# Patient Record
Sex: Female | Born: 1970 | Race: White | Hispanic: No | Marital: Married | Smoking: Current every day smoker
Health system: Southern US, Community
[De-identification: ages and names within clinical notes are randomized; demographics above are authoritative.]

## PROBLEM LIST (undated history)

## (undated) DIAGNOSIS — R0602 Shortness of breath: Secondary | ICD-10-CM

## (undated) DIAGNOSIS — F209 Schizophrenia, unspecified: Secondary | ICD-10-CM

## (undated) DIAGNOSIS — F4310 Post-traumatic stress disorder, unspecified: Secondary | ICD-10-CM

## (undated) DIAGNOSIS — F319 Bipolar disorder, unspecified: Secondary | ICD-10-CM

## (undated) DIAGNOSIS — D649 Anemia, unspecified: Secondary | ICD-10-CM

## (undated) DIAGNOSIS — F32A Depression, unspecified: Secondary | ICD-10-CM

## (undated) DIAGNOSIS — J45909 Unspecified asthma, uncomplicated: Secondary | ICD-10-CM

## (undated) DIAGNOSIS — R569 Unspecified convulsions: Secondary | ICD-10-CM

## (undated) DIAGNOSIS — E039 Hypothyroidism, unspecified: Secondary | ICD-10-CM

## (undated) DIAGNOSIS — R011 Cardiac murmur, unspecified: Secondary | ICD-10-CM

## (undated) DIAGNOSIS — F419 Anxiety disorder, unspecified: Secondary | ICD-10-CM

## (undated) DIAGNOSIS — F329 Major depressive disorder, single episode, unspecified: Secondary | ICD-10-CM

## (undated) DIAGNOSIS — R51 Headache: Secondary | ICD-10-CM

## (undated) HISTORY — PX: NASAL FRACTURE SURGERY: SHX718

## (undated) HISTORY — PX: FOOT SURGERY: SHX648

## (undated) HISTORY — PX: LEG SURGERY: SHX1003

---

## 1994-05-06 HISTORY — PX: TUBAL LIGATION: SHX77

## 2007-12-26 ENCOUNTER — Emergency Department (HOSPITAL_BASED_OUTPATIENT_CLINIC_OR_DEPARTMENT_OTHER): Admission: EM | Admit: 2007-12-26 | Discharge: 2007-12-26 | Payer: Self-pay | Admitting: Emergency Medicine

## 2008-01-07 ENCOUNTER — Encounter: Payer: Self-pay | Admitting: Emergency Medicine

## 2008-01-07 ENCOUNTER — Inpatient Hospital Stay (HOSPITAL_COMMUNITY): Admission: EM | Admit: 2008-01-07 | Discharge: 2008-01-11 | Payer: Self-pay | Admitting: Internal Medicine

## 2008-01-15 ENCOUNTER — Emergency Department (HOSPITAL_BASED_OUTPATIENT_CLINIC_OR_DEPARTMENT_OTHER): Admission: EM | Admit: 2008-01-15 | Discharge: 2008-01-15 | Payer: Self-pay | Admitting: Emergency Medicine

## 2008-05-18 ENCOUNTER — Ambulatory Visit (HOSPITAL_COMMUNITY): Payer: Self-pay | Admitting: Psychology

## 2008-08-10 ENCOUNTER — Ambulatory Visit (HOSPITAL_COMMUNITY): Payer: Self-pay | Admitting: Psychiatry

## 2008-09-15 ENCOUNTER — Ambulatory Visit (HOSPITAL_COMMUNITY): Payer: Self-pay | Admitting: Psychiatry

## 2008-11-24 ENCOUNTER — Ambulatory Visit (HOSPITAL_COMMUNITY): Payer: Self-pay | Admitting: Psychiatry

## 2009-05-15 ENCOUNTER — Ambulatory Visit (HOSPITAL_COMMUNITY): Payer: Self-pay | Admitting: Psychiatry

## 2009-06-16 ENCOUNTER — Ambulatory Visit (HOSPITAL_COMMUNITY): Payer: Self-pay | Admitting: Psychiatry

## 2009-07-13 ENCOUNTER — Ambulatory Visit (HOSPITAL_COMMUNITY): Payer: Self-pay | Admitting: Psychiatry

## 2009-07-17 ENCOUNTER — Emergency Department (HOSPITAL_BASED_OUTPATIENT_CLINIC_OR_DEPARTMENT_OTHER): Admission: EM | Admit: 2009-07-17 | Discharge: 2009-07-17 | Payer: Self-pay | Admitting: Emergency Medicine

## 2010-09-18 NOTE — Procedures (Signed)
EEG NUMBER:  [id number]   This is a portable EEG study performed in an awake and asleep patient  with right handedness.  Hyperventilation and photic stimulation were not  part of the 16-channel EEG with one channel representing heart rate and  rhythm exclusively.  Medications are Protonix, Klonopin, Effexor,  Celexa, Lamictal, Duragesic, Percocet, morphine sulfate, and Seroquel.  The patient is a 40 year old female with bipolar disorder, posttraumatic  stress disorder admitted at this time due to a medical complaint,  compression fractures of the eighth thoracic vertebrae following a fall  while roller skating.  The request form for the EEG does not state why  an EEG is ordered only that it is to rule out possible seizure activity.  There is no clinical equivalent of symptoms noted.   DESCRIPTION:  A posterior dominant background rhythm is seen at 12 Hz.  This is a very fast posterior dominant rhythm, but it is asymmetrically  expressed in this EEG as the right occipital dominant rhythm is 7 Hz at  times and there by several points in frequency slower than the left  counterpart.  I see the slowing mostly over the lateral and not central  temporal occipital vector.  As the study goes on the patient's EEG  remains in normal sinus rhythm with similar appearing QRS complexes  throughout.  Theta range frequencies dominate the drowsiness stages.  The patient fell asleep and was by the technician noted to be snoring.  There is significant beta fast activity in excess over the frontal and  frontal polar region, this is likely a medication induced artifact.  Sleep spindles are noted and are symmetric in nature.  The described  slowing over the O2 region did resolve as the patient entered sleep and  may have been related to an electrode artifact.  Review in several  montages does not confirm a continued slowing over the O2 region.   CONCLUSION:  This is an EEG altered due to medication side effects,  but  it shows no epileptiform activity.  The patient appears drowsy and  asleep for most of this time and has beta fast activity in excess.  If  not already obtained, an imaging study is recommended to see if there is  a correlation to the described O2 slowing that appeared periodically,  but not continuous through this EEG.      Melvyn Novas, M.D.  Electronically Signed     [id number]  D:  01/08/2008 19:37:46  T:  01/09/2008 06:40:39  Job #:  [id number]   cc:   Eduard Clos, MD

## 2010-09-18 NOTE — H&P (Signed)
NAME:  Danielle [name], Danielle [name]                ACCOUNT NO.:  [id number]   MEDICAL RECORD NO.:  [id number]          PATIENT TYPE:  INP   LOCATION:  1406                         FACILITY[facility]  [facility]   PHYSICIAN:  Eduard Clos, MDDATE OF BIRTH:  18-Oct-1970   DATE OF ADMISSION:  01/07/2008  DATE OF DISCHARGE:                              HISTORY & PHYSICAL   PRIORITY ADMISSION HISTORY AND PHYSICAL   CHIEF COMPLAINT:  Chest pain.   HISTORY OF PRESENTING ILLNESS:  A 40 year old female with a history of  bipolar disorder, posttraumatic stress disorder, underlying  panicdisorder, and recently diagnosed with mild T8 superior endplate  compression fracture, which she sustained on August 22nd after she had a  fall during skating, presented to the ER complaining of persistent chest  pain.  The patient stated that she fell while she practiced skate.  At  that time, she did come to the ER, had a CT done, and she did follow  with the neurosurgeon, who had advised her to be on Aleve for pain  relief.  Her back pain has been gradually resolved now, but she states  since she fell she has been having the chest pain that is retrosternal,  constant, stabbing in nature, nonradiating, not relieved by rest.  Denies any associated shortness of breath, cough, fever, chills,  weakness of limbs, abdominal pain, nausea, vomiting, diarrhea.  In the  ER, patient was found to have this persistent pain.  Cardiac enzymes  were negative.  Due to persistent pain, the patient has been admitted  for rule out ACS and of her pain.   PAST MEDICAL HISTORY:  1. Bipolar disorder.  2. Underlying panic disorder.  3. PTSD.   PAST SURGICAL HISTORY:  Tubal ligation.   MEDICATIONS PRIOR TO ADMISSION:  The patient is on:  1. Clonazepam, takes 1.5 mg in the a.m. and at noon, and takes 1 mg at      bedtime.  2. Effexor XR, takes two 25 mg p.o. daily..   ALLERGIES:  No known drug allergies.   SOCIAL HISTORY:  The patient lives with  her husband, smokes cigarettes  but has been advised to quit smoking, denies any alcohol or drug abuse.   FAMILY HISTORY:  Noncontributory.   REVIEW OF SYSTEMS:  As per in the History of Presenting Illness, nothing  else significant.   PHYSICAL EXAMINATION:  GENERAL:  The patient was examined at the bedside  not in acute distress.  VITAL SIGNS:  Blood pressure is 116/80, pulse 84 per minute, temperature  98.7, respirations 16 per minute.  HEENT:  Anicteric conjunctivae.  CHEST:  Clear to auscultation and percussion, no rhonchi, and no  crepitation, no obvious injuries seen.  HEART:  S1 and S2 heard.  ABDOMEN:  Soft and nontender, bowel sounds heard, no guarding, and no  rigidity.  CNS:  The patient is awake, alert, and oriented to time, place, and  person, moves upper and lower extremities 5/5.  EXTREMITIES:  Peripheral pulses felt.   LABORATORY DATA:  CBC:  WBC is 6.3, hemoglobin 13.2, hematocrit 38.9,  platelets 311, neutrophils 51%.  Basic metabolic panel:  Sodium 141,  potassium 3.8, chloride 106, carbon dioxide 24, glucose 33, BUN 10,  creatinine 0.7, calcium 9.6.  CK-MB 11.1, troponin-I of less than 0.05.  Urine pregnancy test negative.  UA is negative for nitrites, leukocytes,  8% blood.  Chest x-ray, nothing acute.  CT of T-spine done without  contrast on December 27, 2007, shows a mild T8 superior endplate  compression fracture, no canal compromise or extension into the  posterior elements, a soft tissue density in the left paracentral canal  at T7 and 8.  This could have pressed inferiorly upon a disk bulge.  However, an acute disk herniation or even a small focal sub/epidural  hematoma would look similar.   ASSESSMENT:  1. Chest pain, atypical, rule out acute coronary syndrome (ACS).  2. Superior endplate compression fracture status post fall.  3. Bipolar disorder.  4. History of posttraumatic stress disorder.  5. History of underlying panicdisorder.  6. Cigarette  smoking.   PLAN:  Admit patient to telemetry and cycle cardiac markers, place the  patient on pain relief medication.  We will get a CT angio of chest and  MRI of T-spine.  The patient is not placed on aspirin due to CT  suggesting a possible sub/epidural hematoma.  She will also not be  placed on low dose of  heparin but will be placed on SCDs for DVT  prophylaxis and will be placed on GI prophylaxis.  Further  recommendations as the patient's condition evolves.      Eduard Clos, MD  Electronically Signed     ANK/MEDQ  D:  01/07/2008  T:  01/07/2008  Job:  [id number]

## 2010-09-18 NOTE — Discharge Summary (Signed)
NAME:  Danielle [name], Danielle [name]                ACCOUNT NO.:  [id number]   MEDICAL RECORD NO.:  [id number]          PATIENT TYPE:  INP   LOCATION:  1406                         [facility]ACILITY:  [facility]   PHYSICIAN:  Isidor Holts, M.D.  DATE OF BIRTH:  [birth year]   DATE OF ADMISSION:  01/07/2008  DATE OF DISCHARGE:  01/11/2008                               DISCHARGE SUMMARY   PRIMARY MEDICAL DOCTOR:  Unassigned.   PRIMARY NEUROSURGEON:  Dr. Tressie Stalker.   DISCHARGE DIAGNOSES:  1. T4 and T8 vertebral fractures.  2. Radiculopathy/musculoskeletal chest pain, secondary to #1.  3. History of bipolar disorder, PTSD and panic disorder.  4. Smoking history.  5. History of seizure disorder.  6. Hypertriglyceridemia.   DISCHARGE MEDICATIONS:  1. Lamotrigine 50 mg p.o. q. a.m. and 100 mg p.o. nightly.  2. Clonazepam 1.5 mg p.o. b.i.d. and 1 mg p.o. nightly.  3. Effexor XR 225 mg p.o. q. a.m.  4. Celexa at 20 mg p.o. daily.  5. Seroquel 50 mg p.o. b.i.d. and 300 mg p.o. nightly.  6. Fenofibrate 48 mg p.o. daily.  7. Motrin 600 mg p.o. t.i.d. with food for 1 week only.  8. Vicodin 5/500 one p.o. p.r.n. q.6 hours, total of 20 pills have      been dispensed.   PROCEDURES:  1. Chest x-ray dated 01/07/2008.  This showed no acute cardiopulmonary      disease.  There was mild compression fracture of the upper endplate      of T8 again noted.  2. Chest CT angiogram dated 01/08/2008.  This showed no evidence of      aortic dissection.  Study which terminates at the upper margin of      the liver.  There was no acute pulmonary parenchymal abnormality.      There is a calcified granuloma in the right middle lobe and mild      date bibasilar scarring.  3. Head CT scan dated 01/08/2008.  This was a normal head CT.  4. MRI thoracic spine dated 01/08/2008.  This showed sequelae of T8      and T4 superior end plate fractures no retropulsion of bone spinal      or neural foraminal stenosis, multilevel thoracic and  lower      cervical disk degeneration with small disk protrusions.   CONSULTATIONS:  Telephone discussion with Dr. Channing [name], neurosurgeon on  January 09, 2008.   ADMISSION HISTORY:  The H&P notes of 01/07/2008, dictated by Dr. Midge [name].  However, in brief this is a 40 year old female, with known  history of bipolar disorder, panic disorder, PTSD, status post previous  tubal ligation, presenting with complaints of chest pain.  Reportedly,  she is status post fall while skating on December 26, 2007.  At the time,  she was diagnosed with a mild T8 superior end plate compression  fracture.  Was evaluated by neurosurgeon Dr. Tressie Stalker at that  time, and was followed up in Dr. Lovell [name]' office.  She was commenced on  NSAIDS.  However, although back pain has somewhat improved, she has  developed  chest pain which is essentially positional in character and  exacerbated by movement.  She was admitted for further evaluation,  investigation and management.   CLINICAL COURSE:  1. A T4/T8 vertebral fracture.  For details of presentation, refer to      admission history above.  The patient was worked up with MRI of      thoracic spine and this confirmed the superior end plate fractures      of T4 and T8 with no neural foraminal or neurologic compromise.      The patient was managed with NSAIDS and p.r.n. opioid analgesics,      with clinical improvement. I had a telephone discussion with Dr.      Channing [name], covering for Dr. Lovell [name] on 01/09/2008.  He recommended      continued NSAID therapy and followup with primary neurosurgeon,      following discharge. No additional recommendations were made.   1. Chest pain.  This is likely radiculopathic/musculoskeletal,      secondary to #1 above.  The patient was worked up with chest x-ray,      which was unremarkable for concerning findings, as well as chest CT      angiogram which showed no evidence of pulmonary embolism or aortic      dissection.   Cardiac enzymes were cycled and were not elevated.  On      detailed questioning, it appears that the pain is exacerbated by      position and also by movement.  The patient was managed as outlined      above, with clinical improvement.   1. Smoking history.  The patient was counseled appropriately for this.   1. Dyslipidemia.  As part of initial workup for chest pain, the      patient underwent fasting lipid profile which showed the following      findings.  Total cholesterol 234, triglycerides 410.  HDL 20, LDL      not calculated.  The patient has been commenced on Fenofibrate for      this.   1. Seizure disorder.  There no problems referable to this, during the      course of the patient's hospitalization.   1. Psychiatric problems.  The patient has multiple psychiatric      problems, including bipolar disorder, PTSD and panic disorder.  She      was continued on pre-admission psychotropics, during the course of      this hospitalization, and there were no problems referable to      these.   DISPOSITION:  The patient was on January 11, 2008 considered clinically  stable for discharge.  She was therefore discharged accordingly.   DIET:  No restrictions.   ACTIVITY:  Recommended to increase activity slowly.   FOLLOWUP INSTRUCTIONS:  The patient is recommended to follow up with her  primary neurosurgeon, Dr. Tressie Stalker, on a date to be determined.  She has been instructed to call for an appointment on 09/08, 2009 a.m.  Telephone number [phone].  This has been communicated to the  patient.  She verbalized understanding.      Isidor Holts, M.D.  Electronically Signed     CO/MEDQ  D:  01/11/2008  T:  01/11/2008  Job:  [id number]

## 2011-02-06 LAB — URINALYSIS, ROUTINE W REFLEX MICROSCOPIC
Glucose, UA: NEGATIVE
Hgb urine dipstick: NEGATIVE
Ketones, ur: NEGATIVE
Protein, ur: NEGATIVE
pH: 6

## 2011-02-06 LAB — CARDIAC PANEL(CRET KIN+CKTOT+MB+TROPI)
CK, MB: 0.5
Relative Index: INVALID
Relative Index: INVALID
Relative Index: INVALID
Troponin I: 0.02
Troponin I: 0.03

## 2011-02-06 LAB — BASIC METABOLIC PANEL
BUN: 8
CO2: 22
CO2: 24
Calcium: 9.1
Chloride: 106
Chloride: 109
Creatinine, Ser: 0.64
Creatinine, Ser: 0.7
GFR calc Af Amer: >60
GFR calc Af Amer: >60
Potassium: 3.8
Sodium: 141

## 2011-02-06 LAB — CBC
HCT: 38.9
Hemoglobin: 13.2
MCHC: 33.9
MCV: 86.8
RBC: 4.48

## 2011-02-06 LAB — POCT CARDIAC MARKERS: Troponin i, poc: <0.05

## 2011-02-06 LAB — LIPID PANEL
Cholesterol: 234 — ABNORMAL HIGH
HDL: 20 — ABNORMAL LOW
Total CHOL/HDL Ratio: 11.7
Triglycerides: 410 — ABNORMAL HIGH

## 2011-02-06 LAB — DIFFERENTIAL
Basophils Relative: 1
Eosinophils Absolute: 0.2
Eosinophils Relative: 3
Lymphs Abs: 2.3
Monocytes Absolute: 0.6
Monocytes Relative: 9

## 2011-02-06 LAB — TSH: TSH: 3.264

## 2011-03-08 ENCOUNTER — Emergency Department (HOSPITAL_COMMUNITY)
Admission: EM | Admit: 2011-03-08 | Discharge: 2011-03-09 | Disposition: A | Discharge status: Home / Self Care | Payer: BC Managed Care – PPO | Attending: Emergency Medicine | Admitting: Emergency Medicine

## 2011-03-08 DIAGNOSIS — Z79899 Other long term (current) drug therapy: Secondary | ICD-10-CM | POA: Insufficient documentation

## 2011-03-08 DIAGNOSIS — E785 Hyperlipidemia, unspecified: Secondary | ICD-10-CM | POA: Insufficient documentation

## 2011-03-08 DIAGNOSIS — F29 Unspecified psychosis not due to a substance or known physiological condition: Secondary | ICD-10-CM | POA: Insufficient documentation

## 2011-03-08 DIAGNOSIS — G40909 Epilepsy, unspecified, not intractable, without status epilepticus: Secondary | ICD-10-CM | POA: Insufficient documentation

## 2011-03-08 DIAGNOSIS — F319 Bipolar disorder, unspecified: Secondary | ICD-10-CM | POA: Insufficient documentation

## 2011-03-08 DIAGNOSIS — Z87828 Personal history of other (healed) physical injury and trauma: Secondary | ICD-10-CM | POA: Insufficient documentation

## 2011-03-08 DIAGNOSIS — R45851 Suicidal ideations: Secondary | ICD-10-CM | POA: Insufficient documentation

## 2011-03-08 LAB — COMPREHENSIVE METABOLIC PANEL
AST: 13 U/L (ref 0–37)
Albumin: 3.6 g/dL (ref 3.5–5.2)
Alkaline Phosphatase: 46 U/L (ref 39–117)
BUN: 15 mg/dL (ref 6–23)
Chloride: 100 mEq/L (ref 96–112)
Potassium: 4.2 mEq/L (ref 3.5–5.1)
Sodium: 136 mEq/L (ref 135–145)
Total Bilirubin: 0.1 mg/dL — ABNORMAL LOW (ref 0.3–1.2)
Total Protein: 6.8 g/dL (ref 6.0–8.3)

## 2011-03-08 LAB — CBC
HCT: 36.3 % (ref 36.0–46.0)
Hemoglobin: 12.2 g/dL (ref 12.0–15.0)
MCH: 30.8 pg (ref 26.0–34.0)
MCHC: 33.6 g/dL (ref 30.0–36.0)
MCV: 91.7 fL (ref 78.0–100.0)
RBC: 3.96 MIL/uL (ref 3.87–5.11)

## 2011-03-08 LAB — DIFFERENTIAL
Basophils Relative: 1 % (ref 0–1)
Lymphocytes Relative: 32 % (ref 12–46)
Lymphs Abs: 1.7 10*3/uL (ref 0.7–4.0)
Monocytes Absolute: 0.4 10*3/uL (ref 0.1–1.0)
Monocytes Relative: 7 % (ref 3–12)
Neutro Abs: 3 10*3/uL (ref 1.7–7.7)
Neutrophils Relative %: 56 % (ref 43–77)

## 2011-03-08 LAB — RAPID URINE DRUG SCREEN, HOSP PERFORMED
Amphetamines: NOT DETECTED
Barbiturates: NOT DETECTED
Opiates: NOT DETECTED
Tetrahydrocannabinol: NOT DETECTED

## 2011-03-08 LAB — ETHANOL: Alcohol, Ethyl (B): <11 mg/dL (ref 0–11)

## 2011-03-09 MED ORDER — PRAZOSIN HCL 2 MG PO CAPS
2.0000 mg | ORAL_CAPSULE | Freq: Every day | ORAL | Status: DC
  Filled 2011-03-09: qty 1

## 2011-03-09 MED ORDER — RISPERIDONE 2 MG PO TABS: 4.0000 mg | ORAL_TABLET | Freq: Two times a day (BID) | ORAL | Status: DC

## 2011-03-09 MED ORDER — ZIPRASIDONE HCL 20 MG PO CAPS: 20.0000 mg | ORAL_CAPSULE | Freq: Every day | ORAL | Status: DC

## 2011-03-09 MED ORDER — ONDANSETRON HCL 4 MG PO TABS: 4.0000 mg | ORAL_TABLET | ORAL | Status: DC | PRN

## 2011-03-09 MED ORDER — LORAZEPAM 1 MG PO TABS: 1.0000 mg | ORAL_TABLET | Freq: Three times a day (TID) | ORAL | Status: DC | PRN

## 2011-03-09 MED ORDER — IBUPROFEN 200 MG PO TABS: 600.0000 mg | ORAL_TABLET | Freq: Three times a day (TID) | ORAL | Status: DC | PRN

## 2011-03-09 MED ORDER — DIVALPROEX SODIUM 500 MG PO DR TAB: 500.0000 mg | DELAYED_RELEASE_TABLET | Freq: Every day | ORAL | Status: DC

## 2011-03-09 MED ORDER — NICOTINE 21 MG/24HR TD PT24: 21.0000 mg | MEDICATED_PATCH | Freq: Every day | TRANSDERMAL | Status: DC

## 2011-03-09 MED ORDER — HYDROXYZINE HCL 25 MG PO TABS: 25.0000 mg | ORAL_TABLET | Freq: Two times a day (BID) | ORAL | Status: DC

## 2011-03-09 MED ORDER — ALUM & MAG HYDROXIDE-SIMETH 200-200-20 MG/5ML PO SUSP: 30.0000 mL | ORAL | Status: DC | PRN

## 2011-03-09 MED ORDER — LEVOTHYROXINE SODIUM 50 MCG PO TABS
50.0000 ug | ORAL_TABLET | Freq: Every day | ORAL | Status: DC
  Filled 2011-03-09: qty 1

## 2011-03-09 MED ORDER — ACETAMINOPHEN 325 MG PO TABS: 650.0000 mg | ORAL_TABLET | ORAL | Status: DC | PRN

## 2011-03-09 MED ORDER — LAMOTRIGINE 200 MG PO TABS
200.0000 mg | ORAL_TABLET | Freq: Every day | ORAL | Status: DC
  Filled 2011-03-09: qty 1

## 2011-03-12 ENCOUNTER — Emergency Department (HOSPITAL_COMMUNITY)
Admission: EM | Admit: 2011-03-12 | Discharge: 2011-03-14 | Disposition: A | Discharge status: Other Acute Inpatient Hospital | Payer: BC Managed Care – PPO | Attending: Emergency Medicine | Admitting: Emergency Medicine

## 2011-03-12 DIAGNOSIS — R440 Auditory hallucinations: Secondary | ICD-10-CM

## 2011-03-12 DIAGNOSIS — F17200 Nicotine dependence, unspecified, uncomplicated: Secondary | ICD-10-CM | POA: Insufficient documentation

## 2011-03-12 DIAGNOSIS — R443 Hallucinations, unspecified: Secondary | ICD-10-CM | POA: Insufficient documentation

## 2011-03-12 DIAGNOSIS — R45851 Suicidal ideations: Secondary | ICD-10-CM | POA: Insufficient documentation

## 2011-03-12 HISTORY — DX: Unspecified convulsions: R56.9

## 2011-03-12 HISTORY — DX: Major depressive disorder, single episode, unspecified: F32.9

## 2011-03-12 HISTORY — DX: Depression, unspecified: F32.A

## 2011-03-12 LAB — BASIC METABOLIC PANEL
Chloride: 100 mEq/L (ref 96–112)
GFR calc Af Amer: >90 mL/min (ref >90)
GFR calc non Af Amer: 83 mL/min — ABNORMAL LOW (ref >90)
Glucose, Bld: 103 mg/dL — ABNORMAL HIGH (ref 70–99)
Potassium: 3.5 mEq/L (ref 3.5–5.1)
Sodium: 134 mEq/L — ABNORMAL LOW (ref 135–145)

## 2011-03-12 LAB — RAPID URINE DRUG SCREEN, HOSP PERFORMED
Benzodiazepines: NOT DETECTED
Cocaine: NOT DETECTED
Opiates: NOT DETECTED
Tetrahydrocannabinol: NOT DETECTED

## 2011-03-12 LAB — CBC
Hemoglobin: 11.1 g/dL — ABNORMAL LOW (ref 12.0–15.0)
MCHC: 33.3 g/dL (ref 30.0–36.0)
RBC: 3.64 MIL/uL — ABNORMAL LOW (ref 3.87–5.11)
WBC: 6.1 10*3/uL (ref 4.0–10.5)

## 2011-03-12 MED ORDER — ALUM & MAG HYDROXIDE-SIMETH 200-200-20 MG/5ML PO SUSP: 30.0000 mL | ORAL | Status: DC | PRN

## 2011-03-12 MED ORDER — ONDANSETRON HCL 4 MG PO TABS: 4.0000 mg | ORAL_TABLET | Freq: Three times a day (TID) | ORAL | Status: DC | PRN

## 2011-03-12 MED ORDER — ZOLPIDEM TARTRATE 5 MG PO TABS: 5.0000 mg | ORAL_TABLET | Freq: Every evening | ORAL | Status: DC | PRN

## 2011-03-12 MED ORDER — LORAZEPAM 1 MG PO TABS
1.0000 mg | ORAL_TABLET | Freq: Three times a day (TID) | ORAL | Status: DC | PRN
  Administered 2011-03-13: 1 mg via ORAL
  Filled 2011-03-12: qty 1

## 2011-03-12 NOTE — ED Notes (Signed)
pts husband took all belongings with him

## 2011-03-12 NOTE — ED Notes (Signed)
Patient here after recent admission to psych ED. Had meds changed and OP therapy w/o improvement. Patient reports that she continues to hear voices and suicidal thoughts

## 2011-03-12 NOTE — ED Provider Notes (Addendum)
History     CSN: [id number] Arrival date & time: 03/12/2011  5:40 PM   First MD Initiated Contact with Patient 03/12/11 1825      Chief Complaint  Patient presents with  . V70.1    (Consider location/radiation/quality/duration/timing/severity/associated sxs/prior treatment) HPI Patient presents with a complaint of feeling suicidal. She was here in the ED and assessed by [facility] approximately 3 days ago. It was recommended that her Geodon be increased to 2 tablets each day as well as Ativan 3 times each day and that she can try outpatient management. She and her spouse does state that her symptoms have not improved over the past several days on this regimen. She states she hears voices that are time her that she is worthless and that she should kill herself. She has a history of similar symptoms previously but these are worse. She denies any substance use. There's been no recent illness including fevers vomiting cough shortness of breath or abdominal pain.  Past Medical History  Diagnosis Date  . Depressed   . Seizures     No past surgical history on file.  No family history on file.  History  Substance Use Topics  . Smoking status: Current Everyday Smoker -- 1.0 packs/day  . Smokeless tobacco: Not on file  . Alcohol Use: No    OB History    Grav Para Term Preterm Abortions TAB SAB Ect Mult Living                  Review of Systems ROS reviewed and otherwise negative except for mentioned in HPI  Allergies  Review of patient's allergies indicates no known allergies.  Home Medications   Current Outpatient Rx  Name Route Sig Dispense Refill  . DIVALPROEX SODIUM 500 MG PO TBEC Oral Take 1,000 mg by mouth at bedtime.     Marland Kitchen HYDROXYZINE HCL 25 MG PO TABS Oral Take 50 mg by mouth 2 (two) times daily.     Marland Kitchen LAMOTRIGINE 200 MG PO TABS Oral Take 200 mg by mouth daily.      Marland Kitchen LEVOTHYROXINE SODIUM 50 MCG PO TABS Oral Take 50 mcg by mouth daily.      Marland Kitchen LORAZEPAM 1 MG  PO TABS Oral Take 1 mg by mouth every 8 (eight) hours. For anxiety.     Marland Kitchen PRAZOSIN HCL 2 MG PO CAPS Oral Take 2 mg by mouth daily.      Marland Kitchen RISPERIDONE 4 MG PO TABS Oral Take 4 mg by mouth 2 (two) times daily.      . TRAZODONE HCL 150 MG PO TABS Oral Take 150 mg by mouth at bedtime.      Marland Kitchen ZIPRASIDONE HCL 20 MG PO CAPS Oral Take 20 mg by mouth 2 (two) times daily with a meal.       BP 95/67  Pulse 67  Temp(Src) 97.5 F (36.4 C) (Oral)  Resp 18  SpO2 96% Vitals reviewed Physical Exam Physical Examination: General appearance - anxious, no acute distress, calling out for help with her suicidal thoughts Mental status - alert, oriented to person, place, and time Eyes - pupils equal and reactive, extraocular eye movements intact Mouth - mucous membranes moist, pharynx normal without lesions Chest - clear to auscultation, no wheezes, rales or rhonchi, symmetric air entry Heart - normal rate, regular rhythm, normal S1, S2, no murmurs, rubs, clicks or gallops Abdomen - soft, nontender, nondistended, no masses or organomegaly Neurological - motor and sensory grossly normal bilaterally  Musculoskeletal - no joint tenderness, deformity or swelling Extremities - peripheral pulses normal, no pedal edema, no clubbing or cyanosis  ED Course  Procedures (including critical care time)  Labs Reviewed  CBC - Abnormal; Notable for the following:    RBC 3.64 (*)    Hemoglobin 11.1 (*)    HCT 33.3 (*)    All other components within normal limits  BASIC METABOLIC PANEL - Abnormal; Notable for the following:    Sodium 134 (*)    Glucose, Bld 103 (*)    GFR calc non Af Amer 83 (*)    All other components within normal limits  VALPROIC ACID LEVEL - Abnormal; Notable for the following:    Valproic Acid Lvl 42.9 (*)    All other components within normal limits  URINE RAPID DRUG SCREEN (HOSP PERFORMED)  POCT PREGNANCY, URINE   No results found.   1. Suicidal ideation   2. Auditory hallucinations      8:48 PM D/w ACT team- they will assess patient in the ED   MDM  Patient presenting with suicidal ideations which have not improved after her trial of outpatient medication change over the last several days. She has a history of bipolar disorder and is hearing auditory hallucinations telling her to kill herself. She was in the ED several days ago and it was recommended by psychiatry that she increase her Geodon which her spouse state that she has been doing without any help in her symptoms. Her medical screening examination was unremarkable and labs are reassuring. Her case was discussed with the ACT team and they will assess her in the ED. She will likely need admission due to failure of outpatient therapy.        Ethelda Chick, MD 03/12/11 2242  Patient stable overnight, awaiting 400 hall bed.  Olivia Mackie, MD 03/14/11 [phone]

## 2011-03-13 ENCOUNTER — Encounter (HOSPITAL_COMMUNITY): Payer: Self-pay | Admitting: *Deleted

## 2011-03-13 MED ORDER — LORAZEPAM 1 MG PO TABS
1.0000 mg | ORAL_TABLET | Freq: Three times a day (TID) | ORAL | Status: DC
  Administered 2011-03-13 – 2011-03-14 (×4): 1 mg via ORAL
  Filled 2011-03-13: qty 1

## 2011-03-13 MED ORDER — PRAZOSIN HCL 1 MG PO CAPS
2.0000 mg | ORAL_CAPSULE | Freq: Every day | ORAL | Status: DC
  Administered 2011-03-13 – 2011-03-14 (×2): 2 mg via ORAL
  Filled 2011-03-13 (×3): qty 2

## 2011-03-13 MED ORDER — NICOTINE 21 MG/24HR TD PT24
MEDICATED_PATCH | TRANSDERMAL | Status: AC
  Administered 2011-03-13: 21 mg via TRANSDERMAL
  Filled 2011-03-13: qty 1

## 2011-03-13 MED ORDER — HYDROXYZINE HCL 25 MG PO TABS
50.0000 mg | ORAL_TABLET | Freq: Two times a day (BID) | ORAL | Status: DC
  Administered 2011-03-13 – 2011-03-14 (×2): 50 mg via ORAL
  Filled 2011-03-13 (×2): qty 2

## 2011-03-13 MED ORDER — ZIPRASIDONE HCL 20 MG PO CAPS
ORAL_CAPSULE | ORAL | Status: AC
  Administered 2011-03-13: 20 mg via ORAL
  Filled 2011-03-13: qty 1

## 2011-03-13 MED ORDER — DIVALPROEX SODIUM ER 500 MG PO TB24
1000.0000 mg | ORAL_TABLET | Freq: Every day | ORAL | Status: DC
  Administered 2011-03-13: 1000 mg via ORAL
  Filled 2011-03-13 (×3): qty 2

## 2011-03-13 MED ORDER — LEVOTHYROXINE SODIUM 50 MCG PO TABS
50.0000 ug | ORAL_TABLET | Freq: Every day | ORAL | Status: DC
  Administered 2011-03-14: 50 ug via ORAL
  Filled 2011-03-13 (×2): qty 1

## 2011-03-13 MED ORDER — LAMOTRIGINE 100 MG PO TABS
200.0000 mg | ORAL_TABLET | Freq: Every day | ORAL | Status: DC
  Administered 2011-03-13 – 2011-03-14 (×2): 200 mg via ORAL
  Filled 2011-03-13 (×4): qty 2

## 2011-03-13 MED ORDER — TRAZODONE HCL 50 MG PO TABS
150.0000 mg | ORAL_TABLET | Freq: Every day | ORAL | Status: DC
  Administered 2011-03-13: 150 mg via ORAL
  Filled 2011-03-13: qty 1

## 2011-03-13 MED ORDER — NICOTINE 21 MG/24HR TD PT24
21.0000 mg | MEDICATED_PATCH | Freq: Every day | TRANSDERMAL | Status: DC
  Administered 2011-03-13 – 2011-03-14 (×2): 21 mg via TRANSDERMAL

## 2011-03-13 MED ORDER — LORAZEPAM 1 MG PO TABS
ORAL_TABLET | ORAL | Status: AC
  Administered 2011-03-13: 1 mg via ORAL
  Filled 2011-03-13: qty 1

## 2011-03-13 MED ORDER — RISPERIDONE 2 MG PO TABS
ORAL_TABLET | ORAL | Status: AC
  Administered 2011-03-13: 4 mg via ORAL
  Filled 2011-03-13: qty 2

## 2011-03-13 MED ORDER — ZIPRASIDONE HCL 20 MG PO CAPS
20.0000 mg | ORAL_CAPSULE | Freq: Two times a day (BID) | ORAL | Status: DC
  Administered 2011-03-13 – 2011-03-14 (×3): 20 mg via ORAL
  Filled 2011-03-13: qty 1

## 2011-03-13 MED ORDER — RISPERIDONE 2 MG PO TABS
4.0000 mg | ORAL_TABLET | Freq: Two times a day (BID) | ORAL | Status: DC
  Administered 2011-03-13 – 2011-03-14 (×3): 4 mg via ORAL
  Filled 2011-03-13: qty 2

## 2011-03-13 NOTE — ED Notes (Signed)
Danielle [name] act in at bs to assess pt; pt awake eating crackers after assessment pt yelling out "I hear the voices, make them stop" pt medicated per holding orders. When giving her medications pt reports "I should have just killed myself" and then repeats self pt now resting in the bed environment secured will continue to monitor for safety.

## 2011-03-13 NOTE — ED Notes (Addendum)
Breakfast tray delivered

## 2011-03-13 NOTE — ED Notes (Signed)
Patient given a sandwich and two orange juices

## 2011-03-13 NOTE — Progress Notes (Signed)
Assessment Note   Danielle [name] is an 40 y.o. female who presents to [facility] with SI/AVH, pls see assessment.  Pt reports hearing voices x2wks w/command to harm self, walked into traffic on 03/12/11.  During assessment, pt was crying in anguish, holding head and ears, moving about the bed--screaming "make the voices stop, please", "make them leave me alone".  Pt has been inpatient at [facility] several times, most recently 2012, unable to provide specifics.  Pt reports compliance with meds, last dose 03/12/11.  Information faxed to [facility] to review for inpt admission.   Axis I: 296.54 Bipolar D/O, Depressed w/Psych Features   Axis II: 799.9 Deferred  Axis III: Seizure D/O, High Cholesterol, Migraine  Axis IV: MH, Support Axis V: 30 Past Medical History  Diagnosis Date  . Depressed   . Seizures    Past Medical History:  Past Medical History  Diagnosis Date  . Depressed   . Seizures     No past surgical history on file.  Family History: No family history on file.  Social History:  reports that she has been smoking.  She does not have any smokeless tobacco history on file. She reports that she does not drink alcohol or use illicit drugs.  Allergies: No Known Allergies  Home Medications:  Medications Prior to Admission  Medication Dose Route Frequency Provider Last Rate Last Dose  . alum & mag hydroxide-simeth (MAALOX/MYLANTA) 200-200-20 MG/5ML suspension 30 mL  30 mL Oral PRN Ethelda Chick, MD      . LORazepam (ATIVAN) tablet 1 mg  1 mg Oral Q8H PRN Ethelda Chick, MD   1 mg at 03/13/11 0604  . ondansetron (ZOFRAN) tablet 4 mg  4 mg Oral Q8H PRN Ethelda Chick, MD      . zolpidem (AMBIEN) tablet 5 mg  5 mg Oral QHS PRN Ethelda Chick, MD       Medications Prior to Admission  Medication Sig Dispense Refill  . divalproex (DEPAKOTE) 500 MG DR tablet Take 1,000 mg by mouth at bedtime.       . hydrOXYzine (ATARAX/VISTARIL) 25 MG tablet Take 50 mg by mouth 2 (two) times daily.         Marland Kitchen lamoTRIgine (LAMICTAL) 200 MG tablet Take 200 mg by mouth daily.        Marland Kitchen levothyroxine (SYNTHROID, LEVOTHROID) 50 MCG tablet Take 50 mcg by mouth daily.        . prazosin (MINIPRESS) 2 MG capsule Take 2 mg by mouth daily.        . risperidone (RISPERDAL) 4 MG tablet Take 4 mg by mouth 2 (two) times daily.        . traZODone (DESYREL) 150 MG tablet Take 150 mg by mouth at bedtime.        . ziprasidone (GEODON) 20 MG capsule Take 20 mg by mouth 2 (two) times daily with a meal.         OB/GYN Status:  No LMP recorded.  General Assessment Data Assessment Number: 1  Living Arrangements: Spouse/significant other Can pt return to current living arrangement?: Yes Admission Status: Voluntary Is patient capable of signing voluntary admission?: Yes Transfer from: Acute Hospital Referral Source: MD  Risk to self Suicidal Ideation: Yes-Currently Present Suicidal Intent: Yes-Currently Present Is patient at risk for suicide?: Yes Suicidal Plan?: Yes-Currently Present Specify Current Suicidal Plan: TRIED TO JUMP IN FRONT OF TRAFFIC  Access to Means: Yes Specify Access to Suicidal Means: TRAFFIC  What  has been your use of drugs/alcohol within the last 12 months?: NONE  Other Self Harm Risks: NONE  Triggers for Past Attempts: Hallucinations Intentional Self Injurious Behavior: Cutting Factors that decrease suicide risk: Positive therapeutic relationships Family Suicide History: Unknown Recent stressful life event(s): Turmoil (Comment) (HALLUCINATIONS ) Persecutory voices/beliefs?: Yes Depression: Yes Depression Symptoms: Loss of interest in usual pleasures Substance abuse history and/or treatment for substance abuse?: No Suicide prevention information given to non-admitted patients: Not applicable  Risk to Others Homicidal Ideation: No Thoughts of Harm to Others: No Current Homicidal Intent: No Current Homicidal Plan: No Access to Homicidal Means: No Identified Victim: NONE  History  of harm to others?: No Assessment of Violence: None Noted Violent Behavior Description: NONE  Does patient have access to weapons?: No Criminal Charges Pending?: No Does patient have a court date: No  Mental Status Report Appear/Hygiene: Disheveled Eye Contact: Poor Motor Activity: Restlessness Speech: Loud Level of Consciousness: Alert Mood: Terrified;Despair Affect: Depressed;Fearful;Frightened Anxiety Level: Moderate Thought Processes: Relevant Judgement: Impaired Orientation: Person;Place;Situation Obsessive Compulsive Thoughts/Behaviors: None  Cognitive Functioning Concentration: Decreased Memory: Recent Intact;Remote Intact IQ: Average Insight: Poor Impulse Control: Poor Appetite: Fair Weight Loss: 0  Weight Gain: 0  Sleep: Decreased Total Hours of Sleep:  (<5 HRS DAILY ) Vegetative Symptoms: None  Prior Inpatient/Outpatient Therapy Prior Therapy: Inpatient Prior Therapy Dates: 2012 Prior Therapy Facilty/Provider(s): [facility]  Reason for Treatment: PSYCHOSIS  ADL Screening (condition at time of admission) Patient's cognitive ability adequate to safely complete daily activities?: Yes Patient able to express need for assistance with ADLs?: Yes Independently performs ADLs?: Yes Weakness of Legs: None Weakness of Arms/Hands: None  Home Assistive Devices/Equipment Home Assistive Devices/Equipment: None  Therapy Consults (therapy consults require a physician order) PT Evaluation Needed: No OT Evalulation Needed: No SLP Evaluation Needed: No Abuse/Neglect Assessment (Assessment to be complete while patient is alone) Physical Abuse: Denies Verbal Abuse: Denies Sexual Abuse: Denies Exploitation of patient/patient's resources: Denies Self-Neglect: Denies Values / Beliefs Cultural Requests During Hospitalization: None Spiritual Requests During Hospitalization: None Consults Spiritual Care Consult Needed: No Social Work Consult Needed: No Dispensing optician (For Healthcare) Advance Directive: Patient does not have advance directive;Patient would like information Pre-existing out of facility DNR order (yellow form or pink MOST form): No    Additional Information 1:1 In Past 12 Months?: No CIRT Risk: No Elopement Risk: No Does patient have medical clearance?: Yes     Disposition:     On Site Evaluation by:   Reviewed with Physician:     Liliane Bade 03/13/2011 6:19 AM

## 2011-03-13 NOTE — ED Notes (Signed)
Pt was tearful today at lunchtime, stating she needed her home meds badly, and that the voices were unbearable. Pt speech is somewhat slow and her cry is high pitched. Husband was present for 1-2 hours around lunchtime and was soothing to patient. MD called and RN was asked to enter orders manually due to computer problems on his side. Orders were initiated and given. Pt continues to rest soundly in between contacts with staff.

## 2011-03-13 NOTE — ED Notes (Signed)
Note made by this tech on 03/13/11 @ 8:07 was incorrect. This was charted on the wrong patient.

## 2011-03-14 ENCOUNTER — Inpatient Hospital Stay (HOSPITAL_COMMUNITY)
Admission: AD | Admit: 2011-03-14 | Discharge: 2011-03-22 | DRG: 430 | Disposition: A | Discharge status: Home / Self Care | Payer: BC Managed Care – PPO | Source: Ambulatory Visit | Attending: Psychiatry | Admitting: Psychiatry

## 2011-03-14 ENCOUNTER — Encounter (HOSPITAL_COMMUNITY): Payer: Self-pay | Admitting: *Deleted

## 2011-03-14 DIAGNOSIS — F3160 Bipolar disorder, current episode mixed, unspecified: Principal | ICD-10-CM

## 2011-03-14 DIAGNOSIS — R45851 Suicidal ideations: Secondary | ICD-10-CM

## 2011-03-14 DIAGNOSIS — F29 Unspecified psychosis not due to a substance or known physiological condition: Secondary | ICD-10-CM

## 2011-03-14 DIAGNOSIS — Z79899 Other long term (current) drug therapy: Secondary | ICD-10-CM

## 2011-03-14 DIAGNOSIS — F17200 Nicotine dependence, unspecified, uncomplicated: Secondary | ICD-10-CM

## 2011-03-14 MED ORDER — LEVOTHYROXINE SODIUM 50 MCG PO TABS
50.0000 ug | ORAL_TABLET | Freq: Every day | ORAL | Status: DC
  Administered 2011-03-15 – 2011-03-22 (×8): 50 ug via ORAL
  Filled 2011-03-14 (×10): qty 1

## 2011-03-14 MED ORDER — ONDANSETRON HCL 4 MG PO TABS
4.0000 mg | ORAL_TABLET | Freq: Three times a day (TID) | ORAL | Status: DC | PRN
  Administered 2011-03-20 (×2): 4 mg via ORAL
  Filled 2011-03-14 (×2): qty 1

## 2011-03-14 MED ORDER — DIVALPROEX SODIUM ER 500 MG PO TB24
1000.0000 mg | ORAL_TABLET | Freq: Every day | ORAL | Status: DC
  Filled 2011-03-14: qty 2

## 2011-03-14 MED ORDER — LAMOTRIGINE 200 MG PO TABS
200.0000 mg | ORAL_TABLET | Freq: Every day | ORAL | Status: DC
  Administered 2011-03-15: 200 mg via ORAL
  Administered 2011-03-16: 100 mg via ORAL
  Administered 2011-03-17 – 2011-03-22 (×6): 200 mg via ORAL
  Filled 2011-03-14 (×7): qty 1
  Filled 2011-03-14: qty 2
  Filled 2011-03-14 (×3): qty 1

## 2011-03-14 MED ORDER — ALUM & MAG HYDROXIDE-SIMETH 200-200-20 MG/5ML PO SUSP: 30.0000 mL | ORAL | Status: DC | PRN

## 2011-03-14 MED ORDER — PRAZOSIN HCL 2 MG PO CAPS
2.0000 mg | ORAL_CAPSULE | Freq: Every day | ORAL | Status: DC
  Filled 2011-03-14 (×2): qty 1

## 2011-03-14 MED ORDER — TRAZODONE HCL 150 MG PO TABS
150.0000 mg | ORAL_TABLET | Freq: Every day | ORAL | Status: DC
  Administered 2011-03-15 – 2011-03-17 (×3): 150 mg via ORAL
  Filled 2011-03-14 (×3): qty 1
  Filled 2011-03-14: qty 3
  Filled 2011-03-14: qty 1

## 2011-03-14 MED ORDER — HYDROXYZINE HCL 50 MG PO TABS
50.0000 mg | ORAL_TABLET | Freq: Two times a day (BID) | ORAL | Status: DC
  Administered 2011-03-15 – 2011-03-18 (×8): 50 mg via ORAL
  Filled 2011-03-14 (×10): qty 1

## 2011-03-14 MED ORDER — RISPERIDONE 2 MG PO TABS
2.0000 mg | ORAL_TABLET | ORAL | Status: DC
  Administered 2011-03-15 (×2): 2 mg via ORAL
  Filled 2011-03-14 (×4): qty 1

## 2011-03-14 MED ORDER — ZOLPIDEM TARTRATE 5 MG PO TABS
5.0000 mg | ORAL_TABLET | Freq: Every evening | ORAL | Status: DC | PRN
  Administered 2011-03-21: 5 mg via ORAL
  Filled 2011-03-14: qty 1

## 2011-03-14 MED ORDER — LORAZEPAM 0.5 MG PO TABS
0.5000 mg | ORAL_TABLET | ORAL | Status: DC | PRN
  Administered 2011-03-15 – 2011-03-17 (×4): 0.5 mg via ORAL
  Filled 2011-03-14 (×4): qty 1

## 2011-03-14 MED ORDER — NICOTINE 21 MG/24HR TD PT24
21.0000 mg | MEDICATED_PATCH | Freq: Every day | TRANSDERMAL | Status: DC
  Administered 2011-03-15 – 2011-03-22 (×8): 21 mg via TRANSDERMAL
  Filled 2011-03-14 (×10): qty 1
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
## 2011-03-14 NOTE — ED Notes (Addendum)
Environment secured.pt awake sitting on the bed. Will continue to monitor for safety.Pt out of bed to the bathroom, then back to rest in the bed. no needs voiced at this time

## 2011-03-14 NOTE — Progress Notes (Signed)
Assessment Note   Danielle [name] is an 40 y.o. female. Pt reassessed on 03-14-11 and continues to endorse with s/i with thoughts of walking into traffic. Pt endorses avh-see [facility] assessment for details.Pt presents flat,tearful.and depressed.  Pt reports ah with command voices telling her to harm self for the past 2 weeks.Pt currently reports hearing voices all the time telling her that she is stupid and dumb and should hurt herself. Pt per previous notes walked into traffic on 03-12-11. Pt currently calm and cooperative resting in ER bed. Consulted with EDP Dr. Radford Pax who is agreeable to transfer pt to [facility] for inpatient treatment. All Appropriate documentation updated and complete. Support documentation complete and pt awaiting transfer to [facility]   Axis I: Major Depressive Disorder, Recurrent Severe with Psychotic Features Axis II: Deferred Axis III: Sejzure Disorder,High Cholesterol,Migraine Past Medical History  Diagnosis Date  . Depressed   . Seizures    Axis IV: problems related to social environment Axis V: 21-30 behavior considerably influenced by delusions or hallucinations OR serious impairment in judgment, communication OR inability to function in almost all areas  Past Medical History:  Past Medical History  Diagnosis Date  . Depressed   . Seizures     No past surgical history on file.  Family History: No family history on file.  Social History:  reports that she has been smoking.  She does not have any smokeless tobacco history on file. She reports that she does not drink alcohol or use illicit drugs.  Allergies: No Known Allergies  Home Medications:  Medications Prior to Admission  Medication Dose Route Frequency Provider Last Rate Last Dose  . alum & mag hydroxide-simeth (MAALOX/MYLANTA) 200-200-20 MG/5ML suspension 30 mL  30 mL Oral PRN Ethelda Chick, MD      . divalproex (DEPAKOTE ER) 24 hr tablet 1,000 mg  1,000 mg Oral QHS Lyanne Co, MD   1,000 mg at 03/13/11  2208  . hydrOXYzine (ATARAX/VISTARIL) tablet 50 mg  50 mg Oral BID Lyanne Co, MD   50 mg at 03/14/11 6387  . lamoTRIgine (LAMICTAL) tablet 200 mg  200 mg Oral Daily Lyanne Co, MD   200 mg at 03/14/11 0949  . levothyroxine (SYNTHROID, LEVOTHROID) tablet 50 mcg  50 mcg Oral QAC breakfast Lyanne Co, MD   50 mcg at 03/14/11 [phone]  . LORazepam (ATIVAN) tablet 1 mg  1 mg Oral Q8H Lyanne Co, MD   1 mg at 03/14/11 1508  . nicotine (NICODERM CQ - dosed in mg/24 hours) patch 21 mg  21 mg Transdermal Daily Lyanne Co, MD   21 mg at 03/14/11 1000  . ondansetron (ZOFRAN) tablet 4 mg  4 mg Oral Q8H PRN Ethelda Chick, MD      . prazosin (MINIPRESS) capsule 2 mg  2 mg Oral Daily Lyanne Co, MD   2 mg at 03/14/11 3295  . risperiDONE (RISPERDAL) tablet 4 mg  4 mg Oral BID Lyanne Co, MD   4 mg at 03/14/11 0954  . traZODone (DESYREL) tablet 150 mg  150 mg Oral QHS Lyanne Co, MD   150 mg at 03/13/11 2208  . ziprasidone (GEODON) capsule 20 mg  20 mg Oral BID Lyanne Co, MD   20 mg at 03/14/11 0956  . zolpidem (AMBIEN) tablet 5 mg  5 mg Oral QHS PRN Ethelda Chick, MD      . DISCONTD: LORazepam (ATIVAN) tablet 1 mg  1 mg Oral Q8H PRN Ethelda Chick, MD   1 mg at 03/13/11 0604   No current outpatient prescriptions on file as of 03/12/2011.    OB/GYN Status:  No LMP recorded.  General Assessment Data Assessment Number: 2  Living Arrangements: Spouse/significant other Can pt return to current living arrangement?: Yes Admission Status: Voluntary Is patient capable of signing voluntary admission?: Yes Transfer from: Acute Hospital Referral Source: Other  Risk to self Suicidal Ideation: Yes-Currently Present Suicidal Intent: Yes-Currently Present Is patient at risk for suicide?: Yes Suicidal Plan?: Yes-Currently Present Specify Current Suicidal Plan: tried to jump in front of traffic Access to Means: Yes Specify Access to Suicidal Means: traffic What has been your  use of drugs/alcohol within the last 12 months?: NONE Other Self Harm Risks: NONE Triggers for Past Attempts: Other (Comment) (HALLUCINATIONS) Intentional Self Injurious Behavior: Cutting Factors that decrease suicide risk: Positive social support Family Suicide History: Unknown Recent stressful life event(s): Turmoil (Comment) Persecutory voices/beliefs?: Yes Depression: Yes Depression Symptoms: Loss of interest in usual pleasures Substance abuse history and/or treatment for substance abuse?: No Suicide prevention information given to non-admitted patients: Not applicable  Risk to Others Homicidal Ideation: No Thoughts of Harm to Others: No Current Homicidal Intent: No Current Homicidal Plan: No Access to Homicidal Means: No Identified Victim: NONE History of harm to others?: No Assessment of Violence: None Noted Violent Behavior Description: NONE Does patient have access to weapons?: No Criminal Charges Pending?: No Does patient have a court date: No  Mental Status Report Appear/Hygiene: Disheveled Eye Contact: Poor Motor Activity: Restlessness Speech: Slow Level of Consciousness: Alert Mood: Fearful Affect: Depressed Anxiety Level: Moderate Thought Processes: Relevant Judgement: Impaired Orientation: Person;Place;Situation Obsessive Compulsive Thoughts/Behaviors: None  Cognitive Functioning Concentration: Decreased Memory: Recent Intact;Remote Intact IQ: Average Insight: Poor Impulse Control: Poor Appetite: Fair Weight Loss: 0  Weight Gain: 0  Sleep: Decreased Total Hours of Sleep:  (<5 HRS DAILY ) Vegetative Symptoms: None  Prior Inpatient/Outpatient Therapy Prior Therapy: Inpatient Prior Therapy Dates: 2012 Prior Therapy Facilty/Provider(s): [facility] Reason for Treatment: PSYCH  ADL Screening (condition at time of admission) Patient's cognitive ability adequate to safely complete daily activities?: Yes Patient able to express need for assistance with  ADLs?: Yes Independently performs ADLs?: Yes Weakness of Legs: None Weakness of Arms/Hands: None  Home Assistive Devices/Equipment Home Assistive Devices/Equipment: None  Therapy Consults (therapy consults require a physician order) PT Evaluation Needed: No OT Evalulation Needed: No SLP Evaluation Needed: No Abuse/Neglect Assessment (Assessment to be complete while patient is alone) Physical Abuse: Denies Verbal Abuse: Denies Sexual Abuse: Denies Exploitation of patient/patient's resources: Denies Self-Neglect: Denies Values / Beliefs Cultural Requests During Hospitalization: None Spiritual Requests During Hospitalization: None Consults Spiritual Care Consult Needed: No Social Work Consult Needed: No Merchant navy officer (For Healthcare) Advance Directive: Patient does not have advance directive;Patient would like information Pre-existing out of facility DNR order (yellow form or pink MOST form): No    Additional Information 1:1 In Past 12 Months?: No CIRT Risk: No Elopement Risk: No Does patient have medical clearance?: Yes     Disposition:  Disposition Disposition of Patient: Inpatient treatment program Type of inpatient treatment program: Adult  On Site Evaluation by:   Reviewed with Physician:     Bjorn Pippin 03/14/2011 5:09 PM

## 2011-03-14 NOTE — ED Notes (Signed)
Environment secured.pt asleep, resting comfortably. Will continue to monitor for safety. 

## 2011-03-14 NOTE — ED Notes (Signed)
Report called to RN [name] Simmonds.  [facility].

## 2011-03-14 NOTE — ED Notes (Signed)
Given two orange juices

## 2011-03-14 NOTE — ED Notes (Signed)
Patient up and going to bathroom

## 2011-03-14 NOTE — ED Notes (Signed)
Assumed care of pt. From night shift, pt. Asleep in bed, abc's wnl.

## 2011-03-14 NOTE — ED Notes (Signed)
Offered pt. Shower when ready.

## 2011-03-15 DIAGNOSIS — F3130 Bipolar disorder, current episode depressed, mild or moderate severity, unspecified: Secondary | ICD-10-CM

## 2011-03-15 MED ORDER — DIVALPROEX SODIUM 500 MG PO DR TAB
500.0000 mg | DELAYED_RELEASE_TABLET | Freq: Every day | ORAL | Status: DC
Start: 2011-03-15 — End: 2011-03-18
  Administered 2011-03-15 – 2011-03-17 (×3): 500 mg via ORAL
  Filled 2011-03-15 (×5): qty 1

## 2011-03-15 MED ORDER — DIVALPROEX SODIUM 500 MG PO DR TAB
1000.0000 mg | DELAYED_RELEASE_TABLET | Freq: Every day | ORAL | Status: DC
  Administered 2011-03-15 – 2011-03-21 (×7): 1000 mg via ORAL
  Filled 2011-03-15 (×10): qty 2

## 2011-03-15 MED ORDER — PRAZOSIN HCL 2 MG PO CAPS
2.0000 mg | ORAL_CAPSULE | Freq: Two times a day (BID) | ORAL | Status: DC
  Administered 2011-03-15 (×2): 2 mg via ORAL
  Administered 2011-03-16: 1 mg via ORAL
  Administered 2011-03-16 – 2011-03-22 (×10): 2 mg via ORAL
  Filled 2011-03-15 (×10): qty 1
  Filled 2011-03-15: qty 2
  Filled 2011-03-15 (×7): qty 1
  Filled 2011-03-15 (×2): qty 2
  Filled 2011-03-15 (×3): qty 1

## 2011-03-15 MED ORDER — RISPERIDONE 3 MG PO TABS
3.0000 mg | ORAL_TABLET | ORAL | Status: DC
  Administered 2011-03-15 – 2011-03-19 (×8): 3 mg via ORAL
  Filled 2011-03-15 (×13): qty 1

## 2011-03-15 MED ORDER — RISPERIDONE 2 MG PO TABS
2.0000 mg | ORAL_TABLET | ORAL | Status: AC
  Administered 2011-03-15: 2 mg via ORAL
  Filled 2011-03-15: qty 1

## 2011-03-15 MED ORDER — PRAZOSIN HCL 2 MG PO CAPS
2.0000 mg | ORAL_CAPSULE | Freq: Every day | ORAL | Status: DC
  Filled 2011-03-15: qty 1

## 2011-03-15 NOTE — Progress Notes (Signed)
Suicide Risk Assessment  Admission Assessment     Demographic factors:  Assessment Details Time of Assessment: Admission Information Obtained From: Patient Current Mental Status:  Current Mental Status: Suicidal ideation indicated by patient;Self-harm thoughts Loss Factors:  Loss Factors: Financial problems / change in socioeconomic status Historical Factors:  Historical Factors: Prior suicide attempts Risk Reduction Factors:  Risk Reduction Factors: Responsible for children under 28 years of age;Sense of responsibility to family;Religious beliefs about death;Living with another person, especially a relative;Positive social support  CLINICAL FACTORS:   Bipolar Disorder:   Mixed State  COGNITIVE FEATURES THAT CONTRIBUTE TO RISK:  Closed-mindedness    SUICIDE RISK:   Moderate:  Frequent suicidal ideation with limited intensity, and duration, some specificity in terms of plans, no associated intent, good self-control, limited dysphoria/symptomatology, some risk factors present, and identifiable protective factors, including available and accessible social support.  PLAN OF CARE:   Danielle [name] S 03/15/2011, 9:38 AM

## 2011-03-15 NOTE — Progress Notes (Signed)
Recreation Therapy Group Note  Date: 03/15/2011         Time: 1115      Group Topic/Focus: The focus of the group is on enhancing the patients' ability to cope with stressors by understanding what coping is, why it is important, the negative effects of stress and developing healthier coping skills. Patients practice [place] and discuss how exercise can be used as a healthy coping strategy.   Participation Level: Active  Participation Quality: Redirectable and Resistant  Affect: Anxious  Cognitive: Hallucinating   Additional Comments: Patient still endorsing auditory hallucinations, says the voices won't stop talking to her. Patient participated in group with encouragement and had several five minute stretches where she was focused on the activity. Patient reported feeling more calm after doing the exercises.   Mazzy Santarelli 03/15/2011 1:29 PM

## 2011-03-15 NOTE — Progress Notes (Signed)
  Admit Note dictated [id number].

## 2011-03-15 NOTE — Progress Notes (Signed)
[facility] Adult Inpatient Family/Significant Other Suicide Prevention Education  Suicide Prevention Education:  Education Completed; Saharah Sherrow, husband [phone]) ,  has been identified by the patient as the family member/significant other with whom the patient will be residing, and identified as the person(s) who will aid the patient in the event of a mental health crisis (suicidal ideations/suicide attempt).  With written consent from the patient, the family member/significant other has been provided the following suicide prevention education, prior to the and/or following the discharge of the patient.  The suicide prevention education provided includes the following:  Suicide risk factors  Suicide prevention and interventions  National Suicide Hotline telephone number  [facility] assessment telephone number  [facility] Emergency Assistance 911  [facility] and/or Residential Mobile Crisis Unit telephone number  Request made of family/significant other to:  Remove weapons (e.g., guns, rifles, knives), all items previously/currently identified as safety concern.    Remove drugs/medications (over-the-counter, prescriptions, illicit drugs), all items previously/currently identified as a safety concern.  The family member/significant other verbalizes understanding of the suicide prevention education information provided.  The family member/significant other agrees to remove the items of safety concern listed above.  Hanna Ra, Aram Beecham 03/15/2011, 11:49 AM

## 2011-03-15 NOTE — Assessment & Plan Note (Signed)
NAME:  Danielle [name], Danielle [name]                ACCOUNT NO.:  [id number]  MEDICAL RECORD NO.:  [id number]  LOCATION:  0400                          FACILITY:  [facility]  PHYSICIAN:  Danielle Ditch, MD DATE OF BIRTH:  04-22-1971  DATE OF ADMISSION:  03/14/2011 DATE OF DISCHARGE:                      PSYCHIATRIC ADMISSION ASSESSMENT   IDENTIFYING INFORMATION:  This is a 40 year old married Caucasian female.  This is a voluntary admission.  HISTORY OF PRESENT ILLNESS:  This is the 1st [facility] admission for Danielle [name] who presented in the emergency room shrieking and crying that the voices were out of control.  Today her chief complaint is "make the voices stop."  She is holding her hands over the ears, says that the voices are really loud telling her that she is dumb, stupid and that she needs to kill herself.  She reported yesterday in the emergency room that she had attempted to step out into traffic yesterday but was stopped by her husband.  She reports auditory hallucinations, getting worse starting about 2 weeks ago.  Feels that her medications are not working.  She also reports that she has some increased recent stressors with this being the 3rd year anniversary of her father's death around mid 2023/03/09 and on the anniversary of the death of a friend who she thinks about frequently.  She reports her home life is good.  Her sleep has been poor and appetite poor.  She denies any conflicts or problems at home.  PAST PSYCHIATRIC HISTORY:  She reports a history of learning disabilities and went to school through the 12th grade in special education classes and has been previously diagnosed with ADHD.  She is currently followed by Dr. Lloyd [name] at the [facility] in [place].  She reports that there she is being treated for bipolar disorder, posttraumatic stress disorder, and panic disorder with agoraphobic.  She reports her husband sets up and manages her medications, and she takes them regularly.  She  believes her medications periodically stop working. Last hospitalization was several months ago at [facility]. She has a history of several admissions to [facility].  SOCIAL HISTORY:  Married since 1996 and reports her husband is supportive.  She has 23 and 65 year old children from a previous relationship and the 42 year old continues to live in the home with Danielle [name] and her husband.  She also has a 31 year old child at home from their relationship.  She completed the 12th grade and reports a history of learning disabilities.  No legal problems.  She denies any history of substance abuse.  PRIMARY CARE PHYSICIAN:  Unknown.  MEDICAL HISTORY:  Include seizures, chronic migraine headaches.  PAST MEDICAL HISTORY:  Bilateral tubal ligation.  CURRENT MEDICATIONS:  Have not yet been validated.  She apparently takes: 1. Depakote 1000 mg ER q.h.s. 2. Hydroxyzine 50 mg p.o. b.i.d. 3. Lamotrigine 200 mg b.i.d. 4. Levothyroxine 50 mcg daily. 5. Pravastatin 2 mg b.i.d. 6. Risperdal 4 mg b.i.d. 7. Geodon 20 mg frequency unknown.  DRUG ALLERGIES:  None.  PHYSICAL EXAM:  Done in the emergency room and is noted in the record. This is a normally developed female approximately 5 feet 9 inches tall, weighs 84 kg.  LABORATORY  DATA:  CBC revealed mild anemia with a hemoglobin of 11.1, hematocrit of 33.3.  MCV 91.5, white count normal at 6.1, platelets 204,000.  Chemistry:  Sodium 134, potassium 3.5, chloride 100, carbon dioxide 23, BUN 17, creatinine 0.85 and random glucose was 103.  Her urine drug screen was negative for all substances.  Her valproate level was noted to be 42.9 this morning.  Her alcohol screen was negative.  PHYSICAL EXAMINATION:  She presents today as a normally developed, adequately nourished, and hydrated female with no abnormal movements. Motor is smooth, holding her hands over her ears and her hair is clipped about 1 inch short, said that she had  just a had her head shaved a couple of weeks ago to give all of her hair to [employer] [employer] and regrets it now, feels that it was a little bit impulsive.  She feels that she is ugly.  She appears in good health.  We have medically and physically examined her and find no variations from the report in the emergency room.  MENTAL STATUS EXAM:  Fully alert female, anxious appearing.  She is cooperative and accepts redirection complaining of a lot of auditory hallucinations.  Says that they are loud asking for help getting me to stop them.  Thinking is fairly logical.  Denies that she wants to hurt herself but feels that the voices are hurting her.  Memory is intact. Thinking is concrete.  Insight minimal.  Judgment poor.  She is a poor historian due to her internal distraction.  DIAGNOSES:  Axis I.  Bipolar disorder, depressed, panic disorder with agoraphobia by history, PTSD by history. Axis II.  No diagnosis. Axis III.  Migraine headache and seizures by history. Axis IV.  Deferred. Axis V.  Current 35, past year 55 estimated.  PLAN:  Voluntarily admit her.  We are going to continue what is believed to be her routine medications that she had gotten in the emergency room for the last couple of days, including Risperdal which will give her at 3 mg p.o. b.i.d.  We are going to increase her Depakote to a total of 1500 mg daily, and will give her an immediate release Depakote at this time, and continue 500 mg in the morning and 1000 mg at bedtime.  We will check a valproate level on November 11th and hope to get her family involved in her treatment.     Danielle [name], N.P.   ______________________________ Danielle Ditch, MD    MAS/MEDQ  D:  03/15/2011  T:  03/15/2011  Job:  [id number]

## 2011-03-15 NOTE — Progress Notes (Signed)
[facility] Group Notes:  (Counselor/Nursing/MHT/Case Management/Adjunct)  03/15/2011 1:32 PM  Type of Therapy:  Group Therapy  Participation Level:  None  Participation Quality:  Inattentive  Affect:  Anxious  Cognitive:  Hallucinating  Insight:  None  Engagement in Group:  None  Engagement in Therapy:  None  Modes of Intervention:  Orientation  Summary of Progress/Problems: Patient appeared distressed. She was actively hallucinating and was holding her head.   [name] Tout 03/15/2011, 1:32 PM

## 2011-03-15 NOTE — Progress Notes (Addendum)
Pt is extremely anxious this AM. Pt affect is sad and anxious and mood is fearfull. Pt has been c/o voices that are telling her to harm herself. Pt appears to be responding to internal stimuli. Pt informed MD of pt situation and new orders were written. Pt is attending groups and is also eating. Writer offered self and discovered that her husband can be a calming influence as well. Writer suggested that she call him after group for additional support. Writer will continue to monitor.  Pt attended lunch but ate very little. Pt is now asleep in her room. Writer will suggest that she speak with her husband again on awakening.  Pt affect is somewhat improved, but pt still reports auditory hallucinations in the form of her mothers voice that tell her she is worthless and that she should kill herself. Writer offered self and suggested that pt attempt to participate in activities that take the focus off of the voices in her head. Writer offered [employer], conversation, puzzles and TV as means to occupy her thoughts. Pt is currently in bed sleeping. Writer will follow up with pt and offer further suggestions. Pt spoke with husband on the phone but that did not help pt mental status. Writer informed Water quality scientist and MHTs of pt status. Irregular and more frequent rounds initiated in order to observe pt activity.
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
## 2011-03-15 NOTE — Tx Team (Signed)
Initial Interdisciplinary Treatment Plan  PATIENT STRENGTHS: (choose at least two) Motivation for treatment/growth Supportive family/friends  PATIENT STRESSORS: Financial difficulties   PROBLEM LIST: Problem List/Patient Goals Date to be addressed Date deferred Reason deferred Estimated date of resolution  Suicide 03/14/11     Depression 03/14/11     Bipolar with severe psychotic features 03/14/11     AVH 03/14/11     Seizure disorder 03/14/11     Migraine headaches 03/14/11                        DISCHARGE CRITERIA:  Ability to meet basic life and health needs Adequate post-discharge living arrangements Improved stabilization in mood, thinking, and/or behavior Medical problems require only outpatient monitoring Motivation to continue treatment in a less acute level of care Need for constant or close observation no longer present Reduction of life-threatening or endangering symptoms to within safe limits Safe-care adequate arrangements made Verbal commitment to aftercare and medication compliance  PRELIMINARY DISCHARGE PLAN: Attend aftercare/continuing care group Outpatient therapy  PATIENT/FAMIILY INVOLVEMENT: This treatment plan has been presented to and reviewed with the patient, Danielle [name].  The patient and family have been given the opportunity to ask questions and make suggestions.  Danielle [name], [employer] 03/15/2011, 1:56 AM

## 2011-03-15 NOTE — Progress Notes (Addendum)
Pt was very anxious when SW met her this morning. She continued to state over and over that the voices wouldn't stop.  She reported her dep and anxiety at a 8 and her SI at a 7. She is able to return home with her husband [name] and she sees Dr [name] of the [facility] for med mangement.  SW will continue to follow and make appropriate appointments and referrals for d/c. Jeannette How, LCSW 03/15/2011 10:00 AM

## 2011-03-16 NOTE — Progress Notes (Signed)
  Pt was in bed resting with eyes closed. Pt had to be awaken for this assessment. Pt continues to endorse SI with no plan, but contracts for safety. Pt denies any HI, but continues to hears persecutory voices.  Pt denies any pain. Pt has been isolative to room since start of my shift and has not gotten up at all. Pt has been resting since start of my shift. Support was given, Q15 checks continue, pt remains safe on the unit.

## 2011-03-16 NOTE — Progress Notes (Signed)
[facility] MD Progress Note   40 y.o. female continues to endorse  s/i with thoughts of walking into traffic. Pt endorses avh. Pt is tearful and depressed.  Pt reports ah with command voices telling her to harm self for the past 2 weeks. Her mother's voice that tell her she is worthless and that she should kill herself.  Pt currently reports hearing voices all the time telling her that she is stupid and dumb and should hurt herself.  She continued to state over and over that the voices wouldn't stop. She reported her dep and anxiety at a 8 and her SI at a 7. She lives with husband [name].  Patient has been hospitalized at [facility]  and [facility] . Stressors include father's death three years ago and his birthday being this month. After he died she became very depressed.   Please see dictation for details. Diagnosis- Psychosis nos.  Mental Status: General Appearance Luretha Murphy:  Casual Eye Contact:  Fair Motor Behavior:  Restlestness Speech:  Slurred Level of Consciousness:  Alert Mood:  Anxious Affect:  Labile Anxiety Level:  Moderate Thought Process:  Relevant Thought Content:  Rumination Perception:  Hallucinations present Judgment:  Fair Insight:  Present Cognition:  Orientation time, place and person Sleep:  Number of Hours: 6   Vital Signs:Blood pressure 113/81, pulse 84, temperature 97.2 F (36.2 C), temperature source Oral, resp. rate 16, height 6' (1.829 m), weight 83.915 kg (185 lb), SpO2 96.00%.  Lab Results:  Results for orders placed during the hospital encounter of 03/14/11 (from the past 48 hour(s))  HCG, SERUM, QUALITATIVE     Status: Normal   Collection Time   03/15/11  8:01 PM      Component Value Range Comment   Preg, Serum NEGATIVE  NEGATIVE       Treatment Plan Summary: 1 will continue with Risperdal,depakote,lamictal 2 need more collateral. 3 los 4-5 days.  Plan:  Larnie Heart S 03/16/2011, 6:00 AM

## 2011-03-16 NOTE — Progress Notes (Signed)
Found pt sleeping in bed at start of shift. On 1:1 she reports distress over command hallucinations telling her to harm herself. No specific plan and she does contract with the RN/staff. She states, "I don't feel like the medications are working and instead of them trying new ones they just keep upping the doses." Supported and encouraged pt to give the meds more time for effectiveness. Also suggested she try a short period of time in the dayroom as distraction to help with the voices. Pt minimally receptive and remains frustrated about med therapy. Denies HI, pain and has remained in her room this evening. Edsel Petrin RN

## 2011-03-16 NOTE — Progress Notes (Signed)
Pt is depressed and flat   She has isolated to her room most of the day   She endorses auditory and visual hallucinations and said it is why she keeps to herself   Verbal support given  Medications administered and effectiveness monitored   Educate on diet ,personal hygiene , medications and side effects, coping skills and diversional activity   Q 15 min checks   Pt safe at present
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
## 2011-03-16 NOTE — Progress Notes (Signed)
[facility] Group Notes:  (Counselor/Nursing/MHT/Case Management/Adjunct)  03/16/2011 12:03 PM  Type of Therapy:  Counseling Group / Dance/Movement Therapy  Participation Level:  Active  Participation Quality:  Drowsy and Sharing  Affect:  Depressed  Cognitive:  Oriented  Insight:  Limited  Engagement in Group:  Limited  Engagement in Therapy:  Limited  Modes of Intervention:  Activity, Clarification, Education, Problem-solving and Socialization  Summary of Progress/Problems: Group discussed healthy and unhealthy ways they help themselves when they feel stuck. Pt. Identified that taking a bath is healthier than thinking about harming herself, which is unhealthy. Group focused on how Thanksgiving is in two weeks, and used this holiday as a model to focus on positives instead of negatives in their lives when feeling stuck. Pt. Shared that they are thankful for her "husband's love" today.   Danielle [name] 03/16/2011, 12:03 PM

## 2011-03-17 LAB — CBC
HCT: 37.8 % (ref 36.0–46.0)
Hemoglobin: 13 g/dL (ref 12.0–15.0)
MCH: 31.3 pg (ref 26.0–34.0)
MCV: 90.9 fL (ref 78.0–100.0)
RBC: 4.16 MIL/uL (ref 3.87–5.11)

## 2011-03-17 LAB — COMPREHENSIVE METABOLIC PANEL
AST: 11 U/L (ref 0–37)
BUN: 13 mg/dL (ref 6–23)
CO2: 26 mEq/L (ref 19–32)
Chloride: 100 mEq/L (ref 96–112)
Creatinine, Ser: 0.98 mg/dL (ref 0.50–1.10)
GFR calc non Af Amer: 70 mL/min — ABNORMAL LOW (ref >90)
Glucose, Bld: 96 mg/dL (ref 70–99)
Total Bilirubin: 0.1 mg/dL — ABNORMAL LOW (ref 0.3–1.2)

## 2011-03-17 LAB — VALPROIC ACID LEVEL: Valproic Acid Lvl: 115.7 ug/mL — ABNORMAL HIGH (ref 50.0–100.0)

## 2011-03-17 NOTE — Progress Notes (Signed)
  [facility] Group Notes:  (Counselor/Nursing/MHT/Case Management/Adjunct)  03/17/2011 11:00 AM  Type of Therapy:  Counseling / Dance/Movement Therapy  Participation Level:  Did Not Attend  Kym Groom 03/17/2011, 11:36 AM

## 2011-03-17 NOTE — Progress Notes (Signed)
  Danielle [name] is in bed, eyes closed. Did awaken and participated in interview. Continues to endorse auditory hallucinations and feels her medications need to change. Feels she got " used to them". Stays in bed most of day. Appetite fair. Feels depressed rating her depression an 8 (1-10) and anxiety a 9 (1-10) Endorsing suicidal thoughts, no plan and her deterrant for self harm is her husband. Plan: awaiting depakote level. Appreciate input from counselor and contact with husband. Encourage group activity. Encourage patient to get out of bed, interact. Continue with medication regime at this time.

## 2011-03-17 NOTE — Progress Notes (Signed)
Pt remains very flat, sad and depressed this evening. She is isolative to room resting in bed throughout. On 1:1 she states the command AH remain but she is able to contract for safety. She denies any HI and is cooperative. Medicated per orders. No prn meds required. Level III obs maintained and pt remains safe. Edsel Petrin RN

## 2011-03-17 NOTE — Progress Notes (Signed)
Pt is depressed and flat   She has isolated to her room   She did not attend group and has little interaction with staff and peers  She does endorse auditory hallucinations command in nature and is able to contract for safety    She has a constricted affect and appears to be fearful   Verbal support given   Medications administered and effectiveness monitored   Encouraged increased visibility on the milue   Q 15 min checks   Educated on diet,medications and safety   Pt safe at present

## 2011-03-18 LAB — VALPROIC ACID LEVEL: Valproic Acid Lvl: 59.8 ug/mL (ref 50.0–100.0)

## 2011-03-18 LAB — COMPREHENSIVE METABOLIC PANEL
Albumin: 3.3 g/dL — ABNORMAL LOW (ref 3.5–5.2)
Alkaline Phosphatase: 42 U/L (ref 39–117)
BUN: 14 mg/dL (ref 6–23)
Potassium: 4.5 mEq/L (ref 3.5–5.1)
Total Protein: 6.3 g/dL (ref 6.0–8.3)

## 2011-03-18 LAB — CBC
MCV: 91.3 fL (ref 78.0–100.0)
Platelets: 197 10*3/uL (ref 150–400)
RDW: 12.7 % (ref 11.5–15.5)
WBC: 5.1 10*3/uL (ref 4.0–10.5)

## 2011-03-18 MED ORDER — CLONAZEPAM 1 MG PO TABS
1.0000 mg | ORAL_TABLET | Freq: Every day | ORAL | Status: DC
  Administered 2011-03-18: 1 mg via ORAL
  Filled 2011-03-18: qty 1

## 2011-03-18 NOTE — Progress Notes (Signed)
Patient reported decrease in voices, depressed, wants to change her medications but i explained to her as voices have reduced i will continue Risperdal. Psycho education given. Patient will also get benefit from psychological testing.

## 2011-03-18 NOTE — Progress Notes (Signed)
Pt did not attend am discharge planning group due to not feeling well.  Pt came to treatment team to discuss her treatment plan and progress.  Pt states that the voices aren't as bad as when she came into the hospital.  Pt's meds being adjusted for mood stabilization.  Pt states that she lives in [place] with husband.  Pt states that she sees Dr. Jennette Kettle at [facility] in [facility] for medication management and therapy.  SW will contact to confirm pt is seen there.  Pt states that her doctor is the one that sent her here.    Danielle [name], LCSWA 03/18/2011  11:12 AM

## 2011-03-18 NOTE — Progress Notes (Signed)
Pt was in bed upon first assessment.  She was given her self-inventory and rated both her depression and hopelessness a 8 today.  At first this morning, she still c/o voices telling her that she ws "worthless" and was still having S/I.  She does contract for safety and denied she has any plans to hurt herself.  She refused to get up for d/c planning group she stated,"I don't feel like it"  However, by the time treatment team took place, she was feeling a bit better and told the team her voices were getting a little better and she was not suicidal at that time.  Reminded her what she put on her self-inventory just a couple hours before treatment team and she stated,"well, that was really from last night I have thoughts off and on but not now"  She had asked for a medication changed and he did d/c her vistaril and she will start klonopin 1 mg tonight at bedtime for her anxiety which she had c/o earlier today too.  Her am dose of Depakote was held d/t her am level yesterday was 115.7 and she still received 1500 mg yesterday.  Dr. Rogers Blocker did d/c her am dose of Depakote and her trazodone.  She voiced understanding to all med changes and seemed satisfied that changes were made.  She has been up more this afternoon for lunch and group but went back to bed right after.  Went into check on her and find out her allergies for they had not been confirmed in the computer.  She denied any medication allergies but admitted to tomato based products that cause her to break out in a rash but she claims she can eat a whole tomato.  She is now asking if she can be discharged by Friday.  We discussed the process and she plans to attend all groups tomorrow and to let the doctor know she is wanting to be ready by Friday.

## 2011-03-18 NOTE — Tx Team (Signed)
Interdisciplinary Treatment Plan Update (Adult)  Date:  03/18/2011  Time Reviewed:  9:54 AM   Progress in Treatment: Attending groups: Yes Participating in groups:  Yes Taking medication as prescribed: Yes Tolerating medication:  Yes Family/Significant othe contact made:  Counselor made contact with pt's husband Patient understands diagnosis:  Yes Discussing patient identified problems/goals with staff:  Yes Medical problems stabilized or resolved:  Yes Denies suicidal/homicidal ideation: No, reports off and on SI Issues/concerns per patient self-inventory:  None identified Other: N/A  New problem(s) identified: Patient identifies some anxiety being around people.  Also her dentures are coming loose, so her speech is slurring.  Reason for Continuation of Hospitalization: Depression Hallucinations Medication stabilization  Anxiety Suicidal ideation  Interventions implemented related to continuation of hospitalization: mood stabilization, medication monitoring and adjustment, group therapy and psycho education, safety checks q 15 mins  Additional comments: N/A  Estimated length of stay: 4-5 days  Discharge Plan: SW is assessing for appropriate referrals.   New goal(s):   Reduce anxiety to manageable level  Review of initial/current patient goals per problem list:   1.  Goal(s): Depression  Met:  No  Target date: by discharge  As evidenced by: Reduce depression from a 10 to a 3, patient states her depression is bad today, "I'm low, not very up"  2.  Goal (s): Suicidal Ideation  Met:  No  Target date: by discharge  As evidenced by: Eliminate suicidal ideation, patient reports that she was suicidal this morning but not now, so SI is off and on for the last few days  3.  Goal(s): Psychosis  Met:  No  Target date: by discharge  As evidenced by: Reduce psychotic symptoms to baseline, "The voices are down today."  4.  Goal(s):  Reduce anxiety to manageable  level.  Met:  No  Target date: by discharge  As evidenced by:  Complains of continued anxiety at this time, which may be partly due to being in group setting much of hospitalization.  Is less anxious around family members.  Anxiety about interaction with people keeps her from having a job like she used to have in the past.   Attendees: Patient:  Danielle [name] 03/18/2011.  10:55 AM  Family:     Physician:  Eulogio Ditch, MD  03/18/2011  9:54 AM   Nursing:   Robbie Louis, RN 03/18/2011  10:50 AM   Case Manager:  Reyes Ivan, LCSWA 03/18/2011  9:54 AM   Counselor:  Veto Kemps, MT-BC 03/18/2011  9:54 AM   Other:  Ambrose Mantle, LCSW 03/18/2011  9:54 AM   Other:     Other:     Other:      Scribe for Treatment Team:   Carmina Miller, 03/18/2011 , 9:54 AM

## 2011-03-18 NOTE — Progress Notes (Signed)
[facility] Group Notes:  (Counselor/Nursing/MHT/Case Management/Adjunct)  04/15/11 3:07 PM  Type of Therapy:  Group Therapy  Participation Level:  Active  Participation Quality:  Attentive and Sharing  Affect:  Depressed  Cognitive:  Oriented  Insight:  Limited  Engagement in Group:  Good  Engagement in Therapy:  Good  Modes of Intervention:  Clarification, Education, Problem-solving, Support and Exploration  Summary of Progress/Problems: Patient stated that she had not heard voices until her father died three years ago. Stated that she sees a Veterinary surgeon but has not done grief work. She is able to know that the voices are not real but sometimes has difficulty shutting them out. Stated that her husband is very supportive and ofen calms her down. Also stated that her 42 year old is a major source of support and often climbs into bed with her when she is scared at night. Talks about increased anxiety since father died.   Zavion Sleight, Aram Beecham 04-15-2011, 3:07 PM

## 2011-03-19 LAB — CBC
HCT: 34.9 % — ABNORMAL LOW (ref 36.0–46.0)
Hemoglobin: 11.7 g/dL — ABNORMAL LOW (ref 12.0–15.0)
RBC: 3.83 MIL/uL — ABNORMAL LOW (ref 3.87–5.11)
WBC: 4.3 10*3/uL (ref 4.0–10.5)

## 2011-03-19 LAB — COMPREHENSIVE METABOLIC PANEL
ALT: 6 U/L (ref 0–35)
Alkaline Phosphatase: 42 U/L (ref 39–117)
CO2: 26 mEq/L (ref 19–32)
Chloride: 104 mEq/L (ref 96–112)
GFR calc Af Amer: 83 mL/min — ABNORMAL LOW (ref >90)
GFR calc non Af Amer: 72 mL/min — ABNORMAL LOW (ref >90)
Glucose, Bld: 92 mg/dL (ref 70–99)
Potassium: 4.4 mEq/L (ref 3.5–5.1)
Sodium: 138 mEq/L (ref 135–145)

## 2011-03-19 MED ORDER — RISPERIDONE 3 MG PO TABS
6.0000 mg | ORAL_TABLET | Freq: Every day | ORAL | Status: DC
  Administered 2011-03-19 – 2011-03-21 (×3): 6 mg via ORAL
  Filled 2011-03-19 (×5): qty 2

## 2011-03-19 MED ORDER — ACETAMINOPHEN 325 MG PO TABS
650.0000 mg | ORAL_TABLET | Freq: Four times a day (QID) | ORAL | Status: DC | PRN
  Administered 2011-03-19: 650 mg via ORAL

## 2011-03-19 MED ORDER — CLONAZEPAM 1 MG PO TABS
2.0000 mg | ORAL_TABLET | Freq: Every day | ORAL | Status: DC
  Administered 2011-03-19 – 2011-03-21 (×3): 2 mg via ORAL
  Filled 2011-03-19 (×3): qty 2

## 2011-03-19 NOTE — Progress Notes (Signed)
Pt attended discharge planning group and actively participated.  Pt presents with calm mood and affect.  SW met with pt individually on this date.  Pt appears to be doing better.  Pt states that she isn't hearing voices today and feels much better.  Pt states that her husband was unable to visit her today.  Pt states that he works third shift and was tired today.  SW informed pt that SW confirmed her appointment date at [facility] [place] group.  SW was told that the therapist pt was seeing is out of network with her insurance now and would have to find another referral.  SW suggested [facility] in [place] because it would be close to pt's home; pt states that would be great.  SW will make referral for pt there.    Reyes Ivan, LCSWA 03/19/2011  2:09 PM

## 2011-03-19 NOTE — Progress Notes (Signed)
Pt belongings are in locker #31 as of today

## 2011-03-19 NOTE — Progress Notes (Signed)
Pt has remained up for groups today but has been in bed between times.  She filled out her self-inventory and rated both her depression and hopelessness a 4 today.  She admits to still hearing voices but stated,"tehy are getting less and are getting under control"  She went on to say that she is now able to block them out and not pay attention to what they are saying right now.  She still reported poor sleep again last night.  We talked again in great detail how she needed to stay out of the bed more today which she has done better.  She did inform Dr. Kandis Mannan and he moved her risperdal all 6 mg to bedtime and is aware she had her morning dose this morning at 0800.  He also, increased her klonopin to 2 mg at bedtime to help with her sleep.  She is asking for discharge by Friday. She knows it depends on her and her symptoms.

## 2011-03-19 NOTE — Tx Team (Signed)
Initial Interdisciplinary Treatment Plan  PATIENT STRENGTHS: (choose at least two) Average or above average intelligence Capable of independent living  PATIENT STRESSORS: Denies   PROBLEM LIST: Problem List/Patient Goals Date to be addressed Date deferred Reason deferred Estimated date of resolution  "Wants some help with depression 03/18/2011           Help to distinguish reality from delusions 03/18/2011                                          DISCHARGE CRITERIA:  Ability to meet basic life and health needs Adequate post-discharge living arrangements Improved stabilization in mood, thinking, and/or behavior Motivation to continue treatment in a less acute level of care Need for constant or close observation no longer present Reduction of life-threatening or endangering symptoms to within safe limits Safe-care adequate arrangements made Verbal commitment to aftercare and medication compliance  PRELIMINARY DISCHARGE PLAN: Outpatient therapy Participate in family therapy Return to previous living arrangement  PATIENT/FAMIILY INVOLVEMENT: This treatment plan has been presented to and reviewed with the patient, Danielle [name], and/or family member.  The patient and family have been given the opportunity to ask questions and make suggestions.  Danielle [name] 03/19/2011, 2:30 AM

## 2011-03-19 NOTE — Progress Notes (Signed)
  Pt. Has remained in her bed most of this PM.  Writer went into patients room to assess her at 19:30 and she was already in the bed.  Pt. Did talk with Clinical research associate and reported that she had a good day.  Pt. Rates her depression as a 2 out of 10.  Pt. Denies SI, or AH at this time.  Pt. Did attend wrap up group but had little input, she states her goal is to stay busy when she is discharged from [facility] but could not elaborate on how she plans to stay busy.  Pt did complain of some leg pain that she rated a 4 and Clinical research associate called MD for an order of tylenol which pt. States reduced the pain to a zero.  Pt. Has no other complaints of pain or discomfort noted this pm.

## 2011-03-19 NOTE — Progress Notes (Signed)
[facility] Group Notes:  (Counselor/Nursing/MHT/Case Management/Adjunct)  03/19/2011 2:05 PM  Type of Therapy:  Group Therapy  Participation Level:  Minimal  Participation Quality:  Drowsy and Sharing  Affect:  Flat  Cognitive:  Oriented  Insight:  Limited  Engagement in Group:  Limited  Engagement in Therapy:  Limited  Modes of Intervention:  Education and Support  Summary of Progress/Problems: Patient appeared drowsy in group today. Stated that she did not sleep last night and was wondering why she was taken off Trazadone. She stated that voices have decreased and she is able to control them. When questioned what this means she stated that even though they tell her negative stuff about herself, she is able to ignore them. She reported decreased anxiety and a slight improvement in mood. Talked about the anniversary of her father's birthday.   Elease Swarm, Aram Beecham 03/19/2011, 2:05 PM

## 2011-03-19 NOTE — Progress Notes (Signed)
Patient reported voices are decreasing but was unable to sleep properly at night. Still depressed but denies suicidal/homicidal ideations. Increased klonopin to 2mg  at bedtime. Changed Risperdal to 6mg  at bedtime.

## 2011-03-19 NOTE — Progress Notes (Signed)
Pt brought extra belongings in and staff locked items up that was not allowed on unit [employer])

## 2011-03-20 NOTE — Progress Notes (Signed)
  SAMIAH RICKLEFS is a 40 y.o. female [id number] 1970/09/03  Subjective/Objective: Patient is doing better.  Denies hearing voices, "the voices are gone".  She is less depressed, denies suicidal, homicidal ideations, is logical and goal-directed, is able to make conversation.  Discussed about her medications with her.    History   Social History  . Marital Status: Married    Spouse Name: N/A    Number of Children: N/A  . Years of Education: N/A   Social History Main Topics  . Smoking status: Current Everyday Smoker -- 1.0 packs/day  . Smokeless tobacco: Not on file  . Alcohol Use: No  . Drug Use: No  . Sexually Active: Yes   Other Topics Concern  . Not on file   Social History Narrative  . No narrative on file     Lab Results:   BMET    Component Value Date/Time   NA 138 03/19/2011 0627   K 4.4 03/19/2011 0627   CL 104 03/19/2011 0627   CO2 26 03/19/2011 0627   GLUCOSE 92 03/19/2011 0627   BUN 12 03/19/2011 0627   CREATININE 0.96 03/19/2011 0627   CALCIUM 9.1 03/19/2011 0627   GFRNONAA 72* 03/19/2011 0627   GFRAA 83* 03/19/2011 0627    Medications:  Scheduled:     . clonazePAM  2 mg Oral QHS  . divalproex  1,000 mg Oral QHS  . lamoTRIgine  200 mg Oral Daily  . levothyroxine  50 mcg Oral QAC breakfast  . nicotine  21 mg Transdermal Daily  . prazosin  2 mg Oral BID  . risperiDONE  6 mg Oral QHS     PRN Meds acetaminophen, alum & mag hydroxide-simeth, LORazepam, ondansetron, zolpidem   Plan: 1.  I am still waiting for the counselor to contact the husband for safety concerns and collateral   2.  I will order a Depakote level today  3.  No side effects reported from the medications  4.  Psychoeducation given to the patient  Chenita Ruda S 03/20/2011

## 2011-03-20 NOTE — Progress Notes (Signed)
  Pt. Was again in bed with her head covered when writer assessed her this pm.  Pt. Removed the covers and smiled when Clinical research associate started conversation.  When ask to rate her depression she rated it a 0 out of 10.  Pt. Reports she feels the klonipin has  Really helped her. Pt had rated her depression a 2 24hrs ago.  Pt. Denies SI, HI and or AH at this time.  Pt. Did states that she felt sick in the early am but was better now and thought it could be related to a medication change.  No complaints of pain or discomfort noted at this time.

## 2011-03-20 NOTE — Progress Notes (Signed)
Pt was in her bed upon first assessment and was given her self-inventory to fill out.  She c/o stomachache and at that time denied any n/v/d.  She reported she had a "good" bm yesterday.  She did go to breakfast and ate.  Informed her to let this nurse of staff know if she started having any other symptoms and she voiced understanding.  When she came to medication window, she was given ginger ale and zofran 4 mg as ordered prn with her am medications.  She was c/o nausea at that time around 0752.  She handed her self-inventory back in and today rated her depression a 2 and hopelessness a 4.  She denied any A/V hallucinations or S/I.  She slept for most of the morning and around 1115 she was lying in bed awake.  We talked a while and she stated, "I am feeling better but not completely"  Discussed some options that she could pick from at the cafeteria for lunch and she voiced understanding.  She said she rated her hopelessness higher today because she just found out her husband's work hours are being reduced and she is worried how they will make it now.  Allowed time for her to vent and she went to lunch.  She came back and took a shower.  She voiced no complaints at this time.  She is now in group at this time.  She is still wanting to leave Friday.

## 2011-03-20 NOTE — Progress Notes (Signed)
[facility] Group Notes:  (Counselor/Nursing/MHT/Case Management/Adjunct)  03/20/2011 1:40 PM  Type of Therapy:  Group Therapy  Participation Level:  Active  Participation Quality:  Attentive, Sharing  Affect:  Flat  Cognitive:  Oriented  Insight:  Good  Engagement in Group:  Good  Engagement in Therapy:  Good  Modes of Intervention:  Education and Problem-solving, support and exploration  Summary of Progress/Problems:   Danielle [name] 03/20/2011, 1:40 PM

## 2011-03-21 NOTE — Discharge Planning (Signed)
Attended Aftercare Planning Group, and was calm, feels medication is working well.  Asked about whether discharge would be "last minute" or planned.  Informed her that we will plan in advance.  Follow-up appointments are in place, Release of Information sheets were completed, and patient was advised that is likely to discharge tomorrow after her husband comes to lunch with her.  Patient's affect is relaxed, and she is fully fluent in her speech at this time without the previous blocking.  Ambrose Mantle, LCSW 03/21/2011, 4:43 PM

## 2011-03-21 NOTE — Progress Notes (Signed)
Pt rates depression at a 1 and hopelessness at a 0 . Pt attends groups and interacts well with peers and staff. Pt is ready to go home and would like to go today but is scheduled for D/C tomorrow. Pt was offered support and encouragement. Pt denies SI/HI. Pt is receptive to treatment and safety is maintained on unit.

## 2011-03-21 NOTE — Tx Team (Signed)
Interdisciplinary Treatment Plan Update (Adult)  Date:  03/22/2011  Time Reviewed:  9:50 AM   Progress in Treatment: Attending groups: Yes. Participating in groups:  Yes. Taking medication as prescribed:  Yes. Tolerating medication:  Yes. and As evidenced by:  No reported or noted side effects Family/Significant othe contact made:  Yes, individual(s) contacted:  husband Patient understands diagnosis:  Yes. Discussing patient identified problems/goals with staff:  Yes. Medical problems stabilized or resolved:  Yes. Denies suicidal/homicidal ideation: Yes. Issues/concerns per patient self-inventory:  No. Other:  New problem(s) identified: No, Describe:  stable for discharge  Reason for Continuation of Hospitalization: None  Interventions implemented related to continuation of hospitalization:  Not applicable Additional comments:  Not applicable  Estimated length of stay:  Discharge today  Discharge Plan:  Go home with husband, continue to follow up with Dr. [name] for medication management, therapy at [facility]  New goal(s):  Not applicable  Review of initial/current patient goals per problem list:   1.  Goal(s):  Reduce depression from 10 to 3  Met:  Yes  Target date:  By Discharge   As evidenced by:  Rated at 0 today  2.  Goal (s):  Reduce suicidal ideation and risk of suicide  Met:  Yes  Target date:  By Discharge   As evidenced by:  3.  Goal(s):  Return psychosis to baseline.  Met:  Yes  Target date:  By Discharge   As evidenced by:  Still hears whispers but states "Now I can keep them at bay"  4.  Goal(s):  Return anxiety to manageable level  Met:  Yes  Target date:  By Discharge   As evidenced by:  Now at 1  Attendees: Patient:  Danielle [name]  03/22/2011  9:50 AM   Family:     Physician:  Dr. Gwenyth Bouillon Ahluwalia 03/22/2011  9:50 AM   Nursing:    03/22/2011  9:50 AM   Case Manager:  Ambrose Mantle, LCSW 03/22/2011  9:50 AM     Counselor:  Veto Kemps, MT-BC 03/22/2011  9:50 AM   Other:  Wilmon Arms, counseling intern 03/22/2011 10:38 AM   Other:     Other:     Other:      Scribe for Treatment Team:   Sarina Ser, 03/21/2011, 9:58 AM

## 2011-03-21 NOTE — Progress Notes (Signed)
[facility] Group Notes:  (Counselor/Nursing/MHT/Case Management/Adjunct)  03/21/2011 11:25 AM  Type of Therapy:  Group Therapy  Participation Level: Active  Participation Quality:  Attentive  Affect:  Blunted  Cognitive:  Oriented  Insight:  Good  Engagement in Group:  Good  Engagement in Therapy:  Good  Modes of Intervention:  Clarification, Education and Exploration  Summary of Progress/Problems: Patient explained that she plans on getting involved with the [facility] and the Peer Support program they offer. She mentioned that when she is home her normal routine is going to have coffee every morning with her sister-in-law. She enjoys going to have coffee, but she would like a change in her normal routine. She said she would like to make friends and by connecting with the [facility] Peer Support program.   Wilmon Arms, Intern 03/21/2011, 11:25 AM Cosigned by Veto Kemps, MT-BC

## 2011-03-21 NOTE — Progress Notes (Signed)
[facility] Group Notes:  (Counselor/Nursing/MHT/Case Management/Adjunct)  03/21/2011 2:54 PM  Type of Therapy:  1:15PM Music Therapy  Participation Level:  Active  Participation Quality:  Appropriate and Attentive  Affect:  Appropriate and Happy (Joking and Smiling)  Cognitive:  Alert and Appropriate  Insight:  Good  Engagement in Group:  Good  Engagement in Therapy:  Good  Modes of Intervention:  Exploration and Music  Summary of Progress/Problems: Danielle [name] seemed to enjoy the relaxation techniques. She was able to be mindful and focus on the music. When asked by the counselor what type of imagery did the first relaxation techniques create for her, Danielle [name] said she couldn't disclose the image while smiling. After the next relaxation technique, Danielle [name] recited a quote that reminder her of her father. When she spoke of her father, she became somewhat tearful.   Wilmon Arms, Intern 03/21/2011, 2:54 PM Cosigned by Veto Kemps, MT-BC

## 2011-03-22 MED ORDER — ZOLPIDEM TARTRATE 5 MG PO TABS: 5.0000 mg | ORAL_TABLET | Freq: Every evening | ORAL | Status: DC | PRN

## 2011-03-22 MED ORDER — CLONAZEPAM 2 MG PO TABS: 2.0000 mg | ORAL_TABLET | Freq: Every day | ORAL | Status: DC

## 2011-03-22 MED ORDER — RISPERIDONE 3 MG PO TABS: 6.0000 mg | ORAL_TABLET | Freq: Every day | ORAL | Status: AC

## 2011-03-22 MED ORDER — DIVALPROEX SODIUM 500 MG PO DR TAB: 1000.0000 mg | DELAYED_RELEASE_TABLET | Freq: Every day | ORAL | Status: DC

## 2011-03-22 NOTE — Discharge Summary (Signed)
Discharge Note  Patient:  Danielle [name] is an 40 y.o., female DOB:  07/11/1970  Date of Admission:  03/14/2011  Date of Discharge: 03/22/2011  Axis Diagnosis:  Axis I: Bipolar, mixed  Level of Care:  OP  Discharge destination:  Home  Is patient on multiple antipsychotic therapies at discharge:  No    Has Patient had three or more failed trials of antipsychotic monotherapy by history:  No  Patient phone:  [phone] (home)  Patient address:   [address] [place] [address],    [name] 03/22/2011, 9:36 AM

## 2011-03-22 NOTE — Progress Notes (Signed)
[facility] Group Notes:  (Counselor/Nursing/MHT/Case Management/Adjunct)  03/22/2011 1:00 PM  Type of Therapy:  9:30AM Group Therapy  Participation Level:  Active  Participation Quality:  Appropriate, Attentive and Sharing  Affect:  Appropriate and Excited  Cognitive:  Alert and Appropriate  Insight:  Good  Engagement in Group:  Good  Engagement in Therapy:  Good  Modes of Intervention:  Clarification, Education, Problem-solving, Support and Exploration  Summary of Progress/Problems: Syretta expressed much excitement about going home today. She reported she is looking forward to connecting to the [employer]'s [facility] program. However, when told she may not be able to participate in this particular program because of her residential county, she became very distraught and appeared a little disappointed. She stated "I don't know how to explain that to my husband". After offering other options to the patient she became somewhat calm and decided that the support groups offered by [facility] would probably be a better fit for her. She stated she could meet more people by attending group sessions, as opposed to connecting to one person through [employer]. Stephine was also receptive of the advice offered by her peers.    Wilmon Arms 03/22/2011, 1:00 PM Cosigned by Veto Kemps, MT-BC

## 2011-03-22 NOTE — Progress Notes (Signed)
Pt Is appropriate and pleasant   She is stating she is looking forward to being discharged tomorrow Friday   She smiles during our conversation and makes positive statements   She attends and participates in group   Verbal suppor5 given  Medications administered and effectiveness monitored   Q 15 min checks   Pt safe at present

## 2011-03-22 NOTE — Discharge Summary (Signed)
Physician Discharge Summary  Patient ID: Danielle [name] MRN: [id number] DOB/AGE: [birth year] 40 y.o.  Admit date: 03/14/2011 Discharge date: 03/22/2011  Hospital Course: [name] is an 40 y.o. Female admitted for  s/i with thoughts of walking into traffic. Pt endorses avh.Pt presented with flat,tearful.and depressed.  Pt reports ah with command voices telling her to harm self for the past 2 weeks.Pt currently reports hearing voices all the time telling her that she is stupid and dumb and should hurt herself.   Patient was started on known her home medications Depakote and Risperdal which was slowly titrated to Risperdal 6 mg per day and Depakote to 1000 mg per day. Patient participated in all the groups. Psychoeducation given to the patient. By the time of discharge patient is logical and goal directed denies having suicidal or homicidal ideations. Denies auditory hallucinations. Patient husband was contacted and he was very comfortable at the time of discharge and taking patient home he didn't have any safety concerns.    Discharge Diagnoses: Bipolar disorder mixed type under remission.    Discharged Condition: Stable   Discharge Orders    Future Orders Please Complete By Expires   Diet - low sodium heart healthy      Activity as tolerated - No restrictions        Current Discharge Medication List    START taking these medications   Details  clonazePAM (KLONOPIN) 2 MG tablet Take 1 tablet (2 mg total) by mouth at bedtime. Qty: 15 tablet, Refills: 0    zolpidem (AMBIEN) 5 MG tablet Take 1 tablet (5 mg total) by mouth at bedtime as needed for sleep. Qty: 10 tablet, Refills: 0      CONTINUE these medications which have CHANGED   Details  divalproex (DEPAKOTE) 500 MG DR tablet Take 2 tablets (1,000 mg total) by mouth at bedtime. Qty: 60 tablet, Refills: 0    risperiDONE (RISPERDAL) 3 MG tablet Take 2 tablets (6 mg total) by mouth at bedtime. Qty: 60 tablet, Refills: 0        CONTINUE these medications which have NOT CHANGED   Details  lamoTRIgine (LAMICTAL) 200 MG tablet Take 200 mg by mouth daily.      levothyroxine (SYNTHROID, LEVOTHROID) 50 MCG tablet Take 50 mcg by mouth daily.      prazosin (MINIPRESS) 2 MG capsule Take 2 mg by mouth daily.        STOP taking these medications     hydrOXYzine (ATARAX/VISTARIL) 25 MG tablet      LORazepam (ATIVAN) 1 MG tablet      traZODone (DESYREL) 150 MG tablet      ziprasidone (GEODON) 20 MG capsule        Follow-up Information    Follow up with [name] - Dr. [name] on 04/04/2011. (Appointment scheduled @ 12:30 pm)    Contact information:   [address] [place] [place], [place] [address] [phone]      Follow up with [facility] on 04/08/2011. (Appointment scheduled with Olegario Messier at 10:00 am)    Contact information:   [address] [place], [place] [address] [phone]         Signed: [name] 03/22/2011, 11:10 AM

## 2011-03-22 NOTE — Progress Notes (Signed)
Adult Services Patient-Family Contact/Session  Attendees:  Patient's husband, [name] [phone])  Goal(s):  Return call, discharge planning  Safety Concerns:  None  Narrative:  Talked with patient's husband in the cafeteria during lunch yesterday. Reviewed discharge plan. Today, he called to say that patient was fearful that her discharge was going to be delayed. Explained to him that she had realized in group today that she would not be able to see [facility] with mental health association due to being a [place] resident. She became tearful and showed lack of coping skills. Discussed with her that she could use the support groups instead of this. Husband was also okay with this plan.  Intern met with patient again to give information about support groups.   Barrier(s):  None  Interventions:  Support, information  Recommendation(s):  Outpatient follow up with Dr. Jennette Kettle , new therapist and support groups  Follow-up Required:  No  Explanation:    Veto Kemps 03/22/2011, 1:15 PM

## 2011-03-22 NOTE — Progress Notes (Signed)
[facility] Case Management Discharge Plan:  Will you be returning to the same living situation after discharge: Yes,  with husband Would you like a referral for services when you are discharged:Yes,  which has been provided, listed below Do you have access to transportation at discharge:Yes,  husband will take home after he visits for lunch Do you have the ability to pay for your medications:Yes,  expresses that there are no issues paying for meds with insurance  Interagency Information:   Release of information for Dr. Jamesetta Orleans office to be signed at discharge.  Patient to Follow up at:  Follow-up Information    Follow up with [name] - Dr. [name] on 04/04/2011. (Appointment scheduled @ 12:30 pm)    Contact information:   [address] [place], [place] [address] [phone]      Follow up with [facility] on 04/08/2011. (Appointment scheduled with Olegario Messier at 10:00 am)    Contact information:   [address] [place], [place] [address] [phone]         Patient denies SI/HI:   Yes,  denies any SI recently    Safety Planning and Suicide Prevention discussed:  Yes,  including risks, warning signs and what to do in the event that SI presents  Barrier to discharge identified:No.  Summary and Recommendations:  There are no barriers to discharge.   Sarina Ser 03/22/2011, 9:53 AM

## 2011-03-22 NOTE — Progress Notes (Signed)
Suicide Risk Assessment  Discharge Assessment     Demographic factors:  Assessment Details Time of Assessment: Admission Information Obtained From: Patient Current Mental Status:  Current Mental Status: Suicidal ideation indicated by patient;Self-harm thoughts Risk Reduction Factors:  Risk Reduction Factors: Responsible for children under 42 years of age;Sense of responsibility to family;Religious beliefs about death;Living with another person, especially a relative;Positive social support  CLINICAL FACTORS:   Bipolar Disorder:   Mixed State  COGNITIVE FEATURES THAT CONTRIBUTE TO RISK:  Closed-mindedness    SUICIDE RISK:   Minimal: No identifiable suicidal ideation.  Patients presenting with no risk factors but with morbid ruminations; may be classified as minimal risk based on the severity of the depressive symptoms  PLAN OF CARE: SEE DISCHARGE SUMMARY  AHLUWALIA,SHAMSHER S 03/22/2011, 9:33 AM

## 2011-03-25 NOTE — Progress Notes (Signed)
Patient Discharge Instructions:  Dictated admission note faxed, Date faxed:  11/19 D/C instructions faxed, Date faxed:  11/19 D/C Summary faxed, Date faxed:  11/19 Med. Rec. Form faxed, Date faxed:  11/19  Reece Agar, 03/25/2011, 3:32 PM

## 2011-04-08 ENCOUNTER — Ambulatory Visit (HOSPITAL_COMMUNITY): Payer: BC Managed Care – PPO | Admitting: Psychology

## 2011-04-16 ENCOUNTER — Ambulatory Visit (HOSPITAL_COMMUNITY): Payer: BC Managed Care – PPO | Admitting: Psychology

## 2011-07-29 ENCOUNTER — Emergency Department (HOSPITAL_COMMUNITY)
Admission: EM | Admit: 2011-07-29 | Discharge: 2011-07-30 | Disposition: A | Discharge status: Other Acute Inpatient Hospital | Payer: BC Managed Care – PPO | Attending: Emergency Medicine | Admitting: Emergency Medicine

## 2011-07-29 DIAGNOSIS — F329 Major depressive disorder, single episode, unspecified: Secondary | ICD-10-CM | POA: Insufficient documentation

## 2011-07-29 DIAGNOSIS — F3289 Other specified depressive episodes: Secondary | ICD-10-CM | POA: Insufficient documentation

## 2011-07-29 DIAGNOSIS — R45851 Suicidal ideations: Secondary | ICD-10-CM | POA: Insufficient documentation

## 2011-07-29 DIAGNOSIS — R443 Hallucinations, unspecified: Secondary | ICD-10-CM

## 2011-07-29 DIAGNOSIS — G40909 Epilepsy, unspecified, not intractable, without status epilepticus: Secondary | ICD-10-CM | POA: Insufficient documentation

## 2011-07-29 DIAGNOSIS — Z79899 Other long term (current) drug therapy: Secondary | ICD-10-CM | POA: Insufficient documentation

## 2011-07-29 LAB — CBC
HCT: 36.6 % (ref 36.0–46.0)
Hemoglobin: 11.8 g/dL — ABNORMAL LOW (ref 12.0–15.0)
MCH: 31.3 pg (ref 26.0–34.0)
MCV: 97.1 fL (ref 78.0–100.0)
Platelets: 223 10*3/uL (ref 150–400)
RBC: 3.77 MIL/uL — ABNORMAL LOW (ref 3.87–5.11)
WBC: 8.1 10*3/uL (ref 4.0–10.5)

## 2011-07-29 LAB — COMPREHENSIVE METABOLIC PANEL
ALT: 29 U/L (ref 0–35)
AST: 22 U/L (ref 0–37)
CO2: 23 mEq/L (ref 19–32)
Calcium: 9 mg/dL (ref 8.4–10.5)
Chloride: 103 mEq/L (ref 96–112)
Creatinine, Ser: 0.59 mg/dL (ref 0.50–1.10)
GFR calc Af Amer: >90 mL/min (ref >90)
GFR calc non Af Amer: >90 mL/min (ref >90)
Glucose, Bld: 94 mg/dL (ref 70–99)
Total Bilirubin: 0.1 mg/dL — ABNORMAL LOW (ref 0.3–1.2)

## 2011-07-29 LAB — RAPID URINE DRUG SCREEN, HOSP PERFORMED
Barbiturates: NOT DETECTED
Cocaine: NOT DETECTED
Opiates: NOT DETECTED
Tetrahydrocannabinol: NOT DETECTED

## 2011-07-29 MED ORDER — ONDANSETRON HCL 4 MG PO TABS: 4.0000 mg | ORAL_TABLET | Freq: Three times a day (TID) | ORAL | Status: DC | PRN

## 2011-07-29 MED ORDER — IBUPROFEN 600 MG PO TABS: 600.0000 mg | ORAL_TABLET | Freq: Three times a day (TID) | ORAL | Status: DC | PRN

## 2011-07-29 MED ORDER — ACETAMINOPHEN 325 MG PO TABS: 650.0000 mg | ORAL_TABLET | ORAL | Status: DC | PRN

## 2011-07-29 MED ORDER — LORAZEPAM 1 MG PO TABS
1.0000 mg | ORAL_TABLET | Freq: Three times a day (TID) | ORAL | Status: DC | PRN
  Administered 2011-07-30: 1 mg via ORAL
  Filled 2011-07-29: qty 1

## 2011-07-29 MED ORDER — LEVOTHYROXINE SODIUM 50 MCG PO TABS
50.0000 ug | ORAL_TABLET | Freq: Every day | ORAL | Status: DC
  Administered 2011-07-30: 50 ug via ORAL
  Filled 2011-07-29: qty 1

## 2011-07-29 MED ORDER — CLONAZEPAM 1 MG PO TABS
2.0000 mg | ORAL_TABLET | Freq: Two times a day (BID) | ORAL | Status: DC | PRN
  Administered 2011-07-29 – 2011-07-30 (×2): 2 mg via ORAL
  Filled 2011-07-29: qty 4
  Filled 2011-07-29: qty 2

## 2011-07-29 MED ORDER — AMITRIPTYLINE HCL 25 MG PO TABS
100.0000 mg | ORAL_TABLET | Freq: Every day | ORAL | Status: DC
  Administered 2011-07-29: 100 mg via ORAL
  Filled 2011-07-29: qty 4

## 2011-07-29 MED ORDER — DIVALPROEX SODIUM 500 MG PO DR TAB
1000.0000 mg | DELAYED_RELEASE_TABLET | Freq: Every day | ORAL | Status: DC
  Administered 2011-07-29: 1000 mg via ORAL
  Filled 2011-07-29: qty 2

## 2011-07-29 MED ORDER — ALUM & MAG HYDROXIDE-SIMETH 200-200-20 MG/5ML PO SUSP: 30.0000 mL | ORAL | Status: DC | PRN

## 2011-07-29 MED ORDER — RISPERIDONE 2 MG PO TABS
6.0000 mg | ORAL_TABLET | Freq: Every day | ORAL | Status: DC
  Administered 2011-07-29: 6 mg via ORAL
  Filled 2011-07-29: qty 3

## 2011-07-29 MED ORDER — PRAZOSIN HCL 2 MG PO CAPS
2.0000 mg | ORAL_CAPSULE | Freq: Every day | ORAL | Status: DC
  Administered 2011-07-30: 2 mg via ORAL
  Filled 2011-07-29: qty 1

## 2011-07-29 MED ORDER — ZOLPIDEM TARTRATE 5 MG PO TABS: 5.0000 mg | ORAL_TABLET | Freq: Every evening | ORAL | Status: DC | PRN

## 2011-07-29 MED ORDER — LAMOTRIGINE 200 MG PO TABS
200.0000 mg | ORAL_TABLET | Freq: Every day | ORAL | Status: DC
  Administered 2011-07-29 – 2011-07-30 (×2): 200 mg via ORAL
  Filled 2011-07-29 (×2): qty 1

## 2011-07-29 NOTE — ED Notes (Signed)
Pt is currently pending [facility]. Pt requests to not be placed under review at [facility] due to a previous experience at the facility. Pt is concerned with placement and states she would like to receive treatment in the [place] area.

## 2011-07-29 NOTE — ED Notes (Signed)
Patient placed in paper scrubs and belongings placed in locker 4 in triage. Security called to check patient and belongings. Patient's wedding ring is unable to be removed due to size.

## 2011-07-29 NOTE — ED Provider Notes (Signed)
History     CSN: [id number]  Arrival date & time 07/29/11  1551   First MD Initiated Contact with Patient 07/29/11 1639      Chief Complaint  Patient presents with  . Medical Clearance    (Consider location/radiation/quality/duration/timing/severity/associated sxs/prior treatment) HPI  Patient presents to the emergency department with complaints of hearing voices c her she is worthless and that she needs to find a way to her self. She denies hearing any specific plans on how to hurt herself and denies being told to hurt anybody else. The patient's spouse says that she has been in and out of hospitals for a long time for the same problem. He states that she becomes acclimated her medications she says hearing voices again, and then she has to see a doctor to have them readjusted. He states that they have called her doctor a couple of days ago for help readjusting her medications and to see if something could be added for in the morning, but the doctor has not called back. Therefore he decided that he could not wait any longer and brought her to the emergency department. At this time the patient is awake and alert, and is answering questions appropriately. The patient's affect is childlike.  Past Medical History  Diagnosis Date  . Depressed   . Seizures     Past Surgical History  Procedure Date  . Tubal ligation 1996    No family history on file.  History  Substance Use Topics  . Smoking status: Current Everyday Smoker -- 1.0 packs/day  . Smokeless tobacco: Not on file  . Alcohol Use: No    OB History    Grav Para Term Preterm Abortions TAB SAB Ect Mult Living                  Review of Systems  All other systems reviewed and are negative.    Allergies  Tomato and Imitrex  Home Medications   Current Outpatient Rx  Name Route Sig Dispense Refill  . ACETAMINOPHEN 500 MG PO TABS Oral Take 1,000-1,500 mg by mouth every 6 (six) hours as needed. For pain.    Marland Kitchen  AMITRIPTYLINE HCL 100 MG PO TABS Oral Take 100 mg by mouth at bedtime.    Marland Kitchen CLONAZEPAM 2 MG PO TABS Oral Take 2 mg by mouth 2 (two) times daily as needed. For anxiety.    Marland Kitchen DIVALPROEX SODIUM 500 MG PO TBEC Oral Take 2 tablets (1,000 mg total) by mouth at bedtime. 60 tablet 0  . LAMOTRIGINE 200 MG PO TABS Oral Take 200 mg by mouth daily.      Marland Kitchen LEVOTHYROXINE SODIUM 50 MCG PO TABS Oral Take 50 mcg by mouth daily.      Marland Kitchen PRAZOSIN HCL 2 MG PO CAPS Oral Take 2 mg by mouth daily.      Marland Kitchen RISPERIDONE 3 MG PO TABS Oral Take 6 mg by mouth at bedtime.      BP 107/71  Pulse 85  Temp(Src) 97.7 F (36.5 C) (Oral)  Resp 20  SpO2 97%  Physical Exam  Nursing note and vitals reviewed. Constitutional: She appears well-developed and well-nourished. No distress.  HENT:  Head: Normocephalic and atraumatic.  Eyes: Pupils are equal, round, and reactive to light.  Neck: Normal range of motion. Neck supple.  Cardiovascular: Normal rate and regular rhythm.   Pulmonary/Chest: Effort normal.  Abdominal: Soft.  Neurological: She is alert.  Skin: Skin is warm and dry.  ED Course  Procedures (including critical care time)  Labs Reviewed  CBC - Abnormal; Notable for the following:    RBC 3.77 (*)    Hemoglobin 11.8 (*)    All other components within normal limits  COMPREHENSIVE METABOLIC PANEL - Abnormal; Notable for the following:    Albumin 3.4 (*)    Total Bilirubin 0.1 (*)    All other components within normal limits  URINE RAPID DRUG SCREEN (HOSP PERFORMED) - Abnormal; Notable for the following:    Benzodiazepines POSITIVE (*)    All other components within normal limits  ETHANOL   No results found.   1. Suicidal ideation   2. Hallucinations       MDM  [facility] has been consulted and has agreed to evaluate patient.        Dorthula Matas, PA 07/29/11 2135

## 2011-07-29 NOTE — BH Assessment (Signed)
Assessment Note   Danielle [name] is an 41 y.o. female. Pt reported to the [facility] with a chief complaint of hearing voices with command to harm herself. Pt states that she has been hearing voices for several years and has been seeking psychiatric services, however every few months the medications become less effective and she has to change medications and seek inpatient hospitalization. Pt states that she has been having increased hallucinations over the past 1-1 1/2 weeks and has felt like she was going to harm herself for the past 2-3 days. Pt states that she has had 2 suicide attempts in the past due to the auditory and visual hallucinations and she "can tell when she is getting to the breaking point and feels she is at that point now." Pt states that the voices tell her she is "worthless, stupid, dumb and should not be on the face of this earth." Pt states she hears 2 voices, the main voice is female though she hears a female voice at times. Pt reports seeing things out of the corner of her eye which she calls "shadow people." Pt states that she can see the figures and "feels as though they are coming for her". Pt currently endorses suicidal ideation and intent however she denies a plan. Pt states that she has had increased depression with symptoms that include: isolation, insomnia, loss of interest in usual pleasures and fatigue. Pt denies HI. Per pt report, she has been diagnosed with Bipolar Disorder, PTSD, Agoraphobia and panic/anxiety disorder. At this time, pt requires inpatient psychiatric treatment for stabilization and medication management.   Axis I: Bipolar Disorder, Most Recent Episode Depressed, Severe with Psychotic Features Axis II: Deferred Axis III:  Past Medical History  Diagnosis Date  . Depressed   . Seizures    Axis IV: problems with access to health care services Axis V: 21-30 behavior considerably influenced by delusions or hallucinations OR serious impairment in judgment,  communication OR inability to function in almost all areas  Past Medical History:  Past Medical History  Diagnosis Date  . Depressed   . Seizures     Past Surgical History  Procedure Date  . Tubal ligation 1996    Family History: No family history on file.  Social History:  reports that she has been smoking.  She does not have any smokeless tobacco history on file. She reports that she does not drink alcohol or use illicit drugs.  Additional Social History:    Allergies:  Allergies  Allergen Reactions  . Tomato Rash    Pt reports that she breaks out in a rash if she eats tomato based foods but can eat a whole tomato  . Imitrex (Sumatriptan Base) Hives    Home Medications:  Medications Prior to Admission  Medication Dose Route Frequency Provider Last Rate Last Dose  . acetaminophen (TYLENOL) tablet 650 mg  650 mg Oral Q4H PRN Dorthula Matas, PA      . alum & mag hydroxide-simeth (MAALOX/MYLANTA) 200-200-20 MG/5ML suspension 30 mL  30 mL Oral PRN Dorthula Matas, PA      . amitriptyline (ELAVIL) tablet 100 mg  100 mg Oral QHS Dorthula Matas, PA      . clonazePAM (KLONOPIN) tablet 2 mg  2 mg Oral BID PRN Dorthula Matas, PA   2 mg at 07/29/11 1831  . divalproex (DEPAKOTE) DR tablet 1,000 mg  1,000 mg Oral QHS Dorthula Matas, PA      . ibuprofen (  ADVIL,MOTRIN) tablet 600 mg  600 mg Oral Q8H PRN Dorthula Matas, PA      . lamoTRIgine (LAMICTAL) tablet 200 mg  200 mg Oral Daily Dorthula Matas, PA      . levothyroxine (SYNTHROID, LEVOTHROID) tablet 50 mcg  50 mcg Oral QAC breakfast Dorthula Matas, PA      . LORazepam (ATIVAN) tablet 1 mg  1 mg Oral Q8H PRN Dorthula Matas, PA      . ondansetron (ZOFRAN) tablet 4 mg  4 mg Oral Q8H PRN Dorthula Matas, PA      . prazosin (MINIPRESS) capsule 2 mg  2 mg Oral Daily Dorthula Matas, PA      . risperiDONE (RISPERDAL) tablet 6 mg  6 mg Oral QHS Dorthula Matas, PA      . zolpidem (AMBIEN) tablet 5 mg  5 mg Oral QHS PRN  Dorthula Matas, PA       Medications Prior to Admission  Medication Sig Dispense Refill  . divalproex (DEPAKOTE) 500 MG DR tablet Take 2 tablets (1,000 mg total) by mouth at bedtime.  60 tablet  0  . lamoTRIgine (LAMICTAL) 200 MG tablet Take 200 mg by mouth daily.        Marland Kitchen levothyroxine (SYNTHROID, LEVOTHROID) 50 MCG tablet Take 50 mcg by mouth daily.        . prazosin (MINIPRESS) 2 MG capsule Take 2 mg by mouth daily.          OB/GYN Status:  No LMP recorded. Patient has had a hysterectomy.  General Assessment Data Location of Assessment: [facility] ED Living Arrangements: Spouse/significant other;Friends;Children (pt has 65 y/o son in the home; 2 adult friends w/ their 3y/o) Can pt return to current living arrangement?: Yes Admission Status: Voluntary Is patient capable of signing voluntary admission?: Yes Transfer from: Acute Hospital Referral Source: Self/Family/Friend  Education Status Is patient currently in school?: No  Risk to self Suicidal Ideation: Yes-Currently Present Suicidal Intent: Yes-Currently Present Is patient at risk for suicide?: Yes Suicidal Plan?: No-Not Currently/Within Last 6 Months Access to Means: No What has been your use of drugs/alcohol within the last 12 months?: pt denies Previous Attempts/Gestures: Yes How many times?: 2  Other Self Harm Risks: 2 SI attempts: tried to drown self, cut wrists Triggers for Past Attempts: Hallucinations Intentional Self Injurious Behavior: None Family Suicide History: Unknown (pt states she is adopted) Recent stressful life event(s): Other (Comment) (reocurrance of hallucinations) Persecutory voices/beliefs?: No Depression: Yes Depression Symptoms: Insomnia;Isolating;Fatigue;Loss of interest in usual pleasures Substance abuse history and/or treatment for substance abuse?: No Suicide prevention information given to non-admitted patients: Not applicable  Risk to Others Homicidal Ideation: No Thoughts of Harm to  Others: No Current Homicidal Intent: No Current Homicidal Plan: No Access to Homicidal Means: No Identified Victim: none History of harm to others?: No Assessment of Violence: None Noted Violent Behavior Description: pt was calm and cooperative during assessment Does patient have access to weapons?: No Criminal Charges Pending?: No Does patient have a court date: No  Psychosis Hallucinations: Auditory;Visual;With command (voices tell pt to harm self) Delusions: None noted  Mental Status Report Appear/Hygiene: Disheveled Eye Contact: Fair Motor Activity: Unremarkable Speech: Logical/coherent;Soft Level of Consciousness: Drowsy Mood: Depressed;Anxious Affect: Appropriate to circumstance;Anxious;Depressed Anxiety Level: Minimal Thought Processes: Coherent;Relevant Judgement: Impaired Orientation: Person;Place;Time;Situation Obsessive Compulsive Thoughts/Behaviors: None  Cognitive Functioning Concentration: Normal Memory: Recent Intact;Remote Intact IQ: Average Insight: Fair Impulse Control: Poor Appetite: Good Weight Loss: 0  Weight  Gain: 0  Sleep: Decreased Total Hours of Sleep:  (interrupted sleep every 1-2 hours) Vegetative Symptoms: None  Prior Inpatient Therapy Prior Inpatient Therapy: Yes Prior Therapy Dates: Multiple admits over last 5-6 years Prior Therapy Facilty/Provider(s): [facility], [place], [place], [place] [facility] Reason for Treatment: psychosis  Prior Outpatient Therapy Prior Outpatient Therapy: Yes Prior Therapy Dates: 2012-present Prior Therapy Facilty/Provider(s): Dr. Neil-psychiatrist ([name]) Reason for Treatment: psychosis                     Additional Information 1:1 In Past 12 Months?: No CIRT Risk: No Elopement Risk: No Does patient have medical clearance?: Yes     Disposition:  Disposition Disposition of Patient: Inpatient treatment program;Referred to [facility]) Type of inpatient treatment program:  Adult Patient referred to: Other (Comment) [facility])  On Site Evaluation by:   Reviewed with Physician:     [place] Crane F 07/29/2011 10:40 PM

## 2011-07-29 NOTE — ED Notes (Signed)
Klonopin 2mg . Given per prn order for significant anxiety.  Pt. Fearful, hearing voices.

## 2011-07-29 NOTE — ED Notes (Signed)
Report called to psyche ED. 

## 2011-07-30 ENCOUNTER — Inpatient Hospital Stay (HOSPITAL_COMMUNITY)
Admission: AD | Admit: 2011-07-30 | Discharge: 2011-08-09 | DRG: 430 | Disposition: A | Discharge status: Home / Self Care | Payer: BC Managed Care – PPO | Source: Ambulatory Visit | Attending: Psychiatry | Admitting: Psychiatry

## 2011-07-30 ENCOUNTER — Encounter (HOSPITAL_COMMUNITY): Payer: Self-pay | Admitting: Behavioral Health

## 2011-07-30 ENCOUNTER — Encounter (HOSPITAL_COMMUNITY): Payer: Self-pay | Admitting: Emergency Medicine

## 2011-07-30 DIAGNOSIS — R45851 Suicidal ideations: Secondary | ICD-10-CM

## 2011-07-30 DIAGNOSIS — F332 Major depressive disorder, recurrent severe without psychotic features: Secondary | ICD-10-CM

## 2011-07-30 DIAGNOSIS — G40909 Epilepsy, unspecified, not intractable, without status epilepticus: Secondary | ICD-10-CM

## 2011-07-30 DIAGNOSIS — Z79899 Other long term (current) drug therapy: Secondary | ICD-10-CM

## 2011-07-30 DIAGNOSIS — F339 Major depressive disorder, recurrent, unspecified: Secondary | ICD-10-CM

## 2011-07-30 DIAGNOSIS — E039 Hypothyroidism, unspecified: Secondary | ICD-10-CM

## 2011-07-30 DIAGNOSIS — F4310 Post-traumatic stress disorder, unspecified: Secondary | ICD-10-CM | POA: Diagnosis present

## 2011-07-30 DIAGNOSIS — Z560 Unemployment, unspecified: Secondary | ICD-10-CM

## 2011-07-30 DIAGNOSIS — N393 Stress incontinence (female) (male): Secondary | ICD-10-CM

## 2011-07-30 DIAGNOSIS — F200 Paranoid schizophrenia: Secondary | ICD-10-CM

## 2011-07-30 DIAGNOSIS — G43909 Migraine, unspecified, not intractable, without status migrainosus: Secondary | ICD-10-CM

## 2011-07-30 DIAGNOSIS — F4001 Agoraphobia with panic disorder: Secondary | ICD-10-CM | POA: Diagnosis present

## 2011-07-30 HISTORY — DX: Headache: R51

## 2011-07-30 HISTORY — DX: Anxiety disorder, unspecified: F41.9

## 2011-07-30 HISTORY — DX: Hypothyroidism, unspecified: E03.9

## 2011-07-30 HISTORY — DX: Anemia, unspecified: D64.9

## 2011-07-30 HISTORY — DX: Cardiac murmur, unspecified: R01.1

## 2011-07-30 MED ORDER — PRAZOSIN HCL 1 MG PO CAPS
2.0000 mg | ORAL_CAPSULE | Freq: Every day | ORAL | Status: DC
  Administered 2011-07-31 – 2011-08-08 (×8): 2 mg via ORAL
  Filled 2011-07-30 (×4): qty 1
  Filled 2011-07-30: qty 10
  Filled 2011-07-30 (×6): qty 1
  Filled 2011-07-30: qty 10

## 2011-07-30 MED ORDER — LAMOTRIGINE 100 MG PO TABS
200.0000 mg | ORAL_TABLET | Freq: Every day | ORAL | Status: DC
  Administered 2011-07-31 – 2011-08-08 (×9): 200 mg via ORAL
  Filled 2011-07-30 (×4): qty 1
  Filled 2011-07-30: qty 20
  Filled 2011-07-30: qty 1
  Filled 2011-07-30: qty 20
  Filled 2011-07-30 (×2): qty 1
  Filled 2011-07-30: qty 20
  Filled 2011-07-30 (×3): qty 1

## 2011-07-30 MED ORDER — PRAZOSIN HCL 2 MG PO CAPS: 2.0000 mg | ORAL_CAPSULE | Freq: Every day | ORAL | Status: DC

## 2011-07-30 MED ORDER — DIVALPROEX SODIUM ER 500 MG PO TB24
1000.0000 mg | ORAL_TABLET | Freq: Every day | ORAL | Status: DC
  Administered 2011-07-30 – 2011-07-31 (×2): 1000 mg via ORAL
  Filled 2011-07-30 (×4): qty 2

## 2011-07-30 MED ORDER — HYDROXYZINE HCL 50 MG PO TABS
50.0000 mg | ORAL_TABLET | Freq: Four times a day (QID) | ORAL | Status: DC | PRN
  Administered 2011-07-30 – 2011-08-03 (×6): 50 mg via ORAL
  Filled 2011-07-30 (×3): qty 1

## 2011-07-30 MED ORDER — AMITRIPTYLINE HCL 100 MG PO TABS
100.0000 mg | ORAL_TABLET | Freq: Every day | ORAL | Status: DC
  Administered 2011-07-30 – 2011-08-06 (×8): 100 mg via ORAL
  Filled 2011-07-30: qty 4
  Filled 2011-07-30 (×8): qty 1
  Filled 2011-07-30: qty 4
  Filled 2011-07-30: qty 1

## 2011-07-30 MED ORDER — LAMOTRIGINE 200 MG PO TABS: 200.0000 mg | ORAL_TABLET | Freq: Every day | ORAL | Status: DC

## 2011-07-30 MED ORDER — LEVOTHYROXINE SODIUM 50 MCG PO TABS
50.0000 ug | ORAL_TABLET | Freq: Every day | ORAL | Status: DC
  Administered 2011-07-31 – 2011-08-09 (×9): 50 ug via ORAL
  Filled 2011-07-30: qty 5
  Filled 2011-07-30 (×7): qty 1
  Filled 2011-07-30: qty 5
  Filled 2011-07-30 (×4): qty 1

## 2011-07-30 MED ORDER — RISPERIDONE 3 MG PO TABS
6.0000 mg | ORAL_TABLET | Freq: Every day | ORAL | Status: DC
  Administered 2011-07-30: 6 mg via ORAL
  Filled 2011-07-30 (×3): qty 2

## 2011-07-30 MED ORDER — ALUM & MAG HYDROXIDE-SIMETH 200-200-20 MG/5ML PO SUSP: 30.0000 mL | ORAL | Status: DC | PRN

## 2011-07-30 MED ORDER — ACETAMINOPHEN 325 MG PO TABS
650.0000 mg | ORAL_TABLET | Freq: Four times a day (QID) | ORAL | Status: DC | PRN
  Administered 2011-08-03: 650 mg via ORAL

## 2011-07-30 MED ORDER — CLONAZEPAM 1 MG PO TABS
1.0000 mg | ORAL_TABLET | ORAL | Status: DC
  Administered 2011-07-30 – 2011-08-09 (×29): 1 mg via ORAL
  Filled 2011-07-30: qty 1
  Filled 2011-07-30: qty 15
  Filled 2011-07-30 (×3): qty 1
  Filled 2011-07-30: qty 15
  Filled 2011-07-30 (×3): qty 1
  Filled 2011-07-30: qty 15
  Filled 2011-07-30 (×2): qty 1
  Filled 2011-07-30: qty 15
  Filled 2011-07-30 (×2): qty 1
  Filled 2011-07-30: qty 15
  Filled 2011-07-30 (×9): qty 1
  Filled 2011-07-30: qty 15
  Filled 2011-07-30 (×10): qty 1

## 2011-07-30 MED ORDER — MAGNESIUM HYDROXIDE 400 MG/5ML PO SUSP: 30.0000 mL | Freq: Every day | ORAL | Status: DC | PRN

## 2011-07-30 NOTE — Tx Team (Signed)
Initial Interdisciplinary Treatment Plan  PATIENT STRENGTHS: (choose at least two) Average or above average intelligence Capable of independent living Motivation for treatment/growth Religious Affiliation Supportive family/friends  PATIENT STRESSORS: Financial difficulties Health problems Loss of father 5yaers ago.*   PROBLEM LIST: Problem List/Patient Goals Date to be addressed Date deferred Reason deferred Estimated date of resolution  A/V Hallucinations 07/31/11     Suicide thoughts 07/31/11                                                DISCHARGE CRITERIA:  Ability to meet basic life and health needs Improved stabilization in mood, thinking, and/or behavior Medical problems require only outpatient monitoring Motivation to continue treatment in a less acute level of care  PRELIMINARY DISCHARGE PLAN: Attend aftercare/continuing care group Attend PHP/IOP Outpatient therapy Participate in family therapy Return to previous living arrangement  PATIENT/FAMIILY INVOLVEMENT: This treatment plan has been presented to and reviewed with the patient, Danielle [name], and/or family member.  The patient and family have been given the opportunity to ask questions and make suggestions.  Roselie Skinner [facility] 07/30/2011, 4:58 PM

## 2011-07-30 NOTE — ED Provider Notes (Signed)
Medical screening examination/treatment/procedure(s) were performed by non-physician practitioner and as supervising physician I was immediately available for consultation/collaboration.    [name] Kras, MD 07/30/11 [phone]

## 2011-07-30 NOTE — ED Notes (Signed)
Meal tray given 

## 2011-07-30 NOTE — ED Notes (Signed)
Zuley Lutter husband  [phone]

## 2011-07-30 NOTE — ED Notes (Signed)
Requested something for anxiety; medicated as ordered.

## 2011-07-30 NOTE — ED Notes (Signed)
Attempted to call report to [name]. Maureen Ralphs unable to take report will call back.

## 2011-07-30 NOTE — ED Notes (Signed)
Patient is resting comfortably. 

## 2011-07-30 NOTE — Progress Notes (Signed)
This is a 41 years old Caucasian female admitted to the unit this afternoon due to suicide thoughts and audio/visual hallucinations. Patients reports that she has been hearing voices telling her to cut her wrist; 2 histories of suicide attempts. She states that she takes her medications as prescribed but feels she has developed tolerants to the medications and they no longer helps with the voices. She also reports seeing shadows and people; "at the conner of my eyes, "I know this sounds crazy". She endorses history of seizure and last one about a week ago. She also reports history of stress incontinence and has body odor related to that; " I have it when I cough, sneeze or laugh". Her mood and and affect sad and depressed. She endorsed passive SI; but verbally contracts for safety. RN that did patient search reported that patient was incontinent during search assessment. Writer encouraged patient to have shower and provided sanitary pads for patient. Patient alert and oriented during this assessment, her thought process organized and appropriated, but speech slurred. Pt encouraged and supported, Q 15 minutes checks continues to maintain safety.

## 2011-07-30 NOTE — BH Assessment (Signed)
Assessment Note   Danielle [name] is an 41 y.o. female. PREVIOUS ASSESSMENT- Danielle [name] is an 41 y.o. female. Pt reported to the [facility] with a chief complaint of hearing voices with command to harm herself. Pt states that she has been hearing voices for several years and has been seeking psychiatric services, however every few months the medications become less effective and she has to change medications and seek inpatient hospitalization. Pt states that she has been having increased hallucinations over the past 1-1 1/2 weeks and has felt like she was going to harm herself for the past 2-3 days. Pt states that she has had 2 suicide attempts in the past due to the auditory and visual hallucinations and she "can tell when she is getting to the breaking point and feels she is at that point now." Pt states that the voices tell her she is "worthless, stupid, dumb and should not be on the face of this earth." Pt states she hears 2 voices, the main voice is female though she hears a female voice at times. Pt reports seeing things out of the corner of her eye which she calls "shadow people." Pt states that she can see the figures and "feels as though they are coming for her". Pt currently endorses suicidal ideation and intent however she denies a plan. Pt states that she has had increased depression with symptoms that include: isolation, insomnia, loss of interest in usual pleasures and fatigue. Pt denies HI. Per pt report, she has been diagnosed with Bipolar Disorder, PTSD, Agoraphobia and panic/anxiety disorder. At this time, pt requires inpatient psychiatric treatment for stabilization and medication management.   REASESSMENT 07-30-11- PT CONTINUES TO ENDORSE INCREASED AH-VOICES SPECIFICALLY TELLING HER TO KILL HERSELF FOR THE PAST 2 DAYS. PT REPORTS THAT THE VOICES CONTINUE TO TELL HER MEAN THINGS BUT ARE NOT SAYING MEAN THINGS AT THIS TIME. PT REPORTS THAT THE VOICES ARE ONLY TELLING HER TO KILL HERSELF CURRENTLY.  PT APPEARS ANXIOUS AND KEEPS COMING OUT OF HER ROOM AND STATES I AM "FRUSTRATED" BUT WILL NOT STATE WHY. PT UNABLE TO CONTRACT FOR SAFETY AND INPATIENT TREATMENT RECOMMENDED FOR SAFETY AND STABILIZATION. PT ACCEPTED TO [facility] FOR INPATIENT ADMISSION BY DR. Allena Katz. MIKE SMITH PSYCHIATRIC NURSE NOTIFIED OF DISPOSITION. DR. Caryn Bee [facility] NOTIFIED AND IS AGREEABLE TO REQUEST PATIENT TRANSFER TO [facility]. ALL APPROPRIATE SUPPORT DOCUMENTATION COMPLETED AND FAXED TO [facility] FOR REVIEW.       Axis I: Bipolar Disorder,Most Recent Episode Depressed,Severe with Psychotic Features Axis II: Deferred Axis III:  Past Medical History  Diagnosis Date  . Depressed   . Seizures    Axis IV: problems with access to health care services Axis V: 21-30 behavior considerably influenced by delusions or hallucinations OR serious impairment in judgment, communication OR inability to function in almost all areas  Past Medical History:  Past Medical History  Diagnosis Date  . Depressed   . Seizures     Past Surgical History  Procedure Date  . Tubal ligation 1996    Family History: History reviewed. No pertinent family history.  Social History:  reports that she has been smoking.  She does not have any smokeless tobacco history on file. She reports that she does not drink alcohol or use illicit drugs.  Additional Social History:    Allergies:  Allergies  Allergen Reactions  . Tomato Rash    Pt reports that she breaks out in a rash if she eats tomato based foods but can eat  a whole tomato  . Imitrex (Sumatriptan Base) Hives    Home Medications:  Medications Prior to Admission  Medication Dose Route Frequency Provider Last Rate Last Dose  . acetaminophen (TYLENOL) tablet 650 mg  650 mg Oral Q4H PRN Dorthula Matas, PA      . alum & mag hydroxide-simeth (MAALOX/MYLANTA) 200-200-20 MG/5ML suspension 30 mL  30 mL Oral PRN Dorthula Matas, PA      . amitriptyline (ELAVIL) tablet 100 mg  100 mg Oral QHS  Dorthula Matas, PA   100 mg at 07/29/11 2246  . clonazePAM (KLONOPIN) tablet 2 mg  2 mg Oral BID PRN Dorthula Matas, PA   2 mg at 07/30/11 0947  . divalproex (DEPAKOTE) DR tablet 1,000 mg  1,000 mg Oral QHS Dorthula Matas, PA   1,000 mg at 07/29/11 2246  . ibuprofen (ADVIL,MOTRIN) tablet 600 mg  600 mg Oral Q8H PRN Dorthula Matas, PA      . lamoTRIgine (LAMICTAL) tablet 200 mg  200 mg Oral Daily Dorthula Matas, PA   200 mg at 07/30/11 0947  . levothyroxine (SYNTHROID, LEVOTHROID) tablet 50 mcg  50 mcg Oral QAC breakfast Dorthula Matas, PA   50 mcg at 07/30/11 0835  . LORazepam (ATIVAN) tablet 1 mg  1 mg Oral Q8H PRN Dorthula Matas, PA   1 mg at 07/30/11 0428  . ondansetron (ZOFRAN) tablet 4 mg  4 mg Oral Q8H PRN Dorthula Matas, PA      . prazosin (MINIPRESS) capsule 2 mg  2 mg Oral Daily Dorthula Matas, PA   2 mg at 07/30/11 0947  . risperiDONE (RISPERDAL) tablet 6 mg  6 mg Oral QHS Dorthula Matas, PA   6 mg at 07/29/11 2245  . zolpidem (AMBIEN) tablet 5 mg  5 mg Oral QHS PRN Dorthula Matas, PA       Medications Prior to Admission  Medication Sig Dispense Refill  . divalproex (DEPAKOTE) 500 MG DR tablet Take 2 tablets (1,000 mg total) by mouth at bedtime.  60 tablet  0  . lamoTRIgine (LAMICTAL) 200 MG tablet Take 200 mg by mouth daily.        Marland Kitchen levothyroxine (SYNTHROID, LEVOTHROID) 50 MCG tablet Take 50 mcg by mouth daily.        . prazosin (MINIPRESS) 2 MG capsule Take 2 mg by mouth daily.          OB/GYN Status:  No LMP recorded. Patient has had a hysterectomy.  General Assessment Data Location of Assessment: [facility] ACT Assessment: Yes Living Arrangements: Spouse/significant other Can pt return to current living arrangement?: Yes Admission Status: Voluntary Is patient capable of signing voluntary admission?: Yes Transfer from: Acute Hospital Referral Source: Self/Family/Friend  Education Status Is patient currently in school?: No  Risk to self Suicidal  Ideation: Yes-Currently Present Suicidal Intent: Yes-Currently Present Is patient at risk for suicide?: Yes Suicidal Plan?: Yes-Currently Present Specify Current Suicidal Plan: voices telling her to kill herself Access to Means: No What has been your use of drugs/alcohol within the last 12 months?: pt denies Previous Attempts/Gestures: Yes How many times?: 2  Other Self Harm Risks: 2 SI attempts,tried to drown self and cut wrist Triggers for Past Attempts: Hallucinations Intentional Self Injurious Behavior: None Family Suicide History: Unknown Recent stressful life event(s): Other (Comment) (chronic mental illness-ah) Persecutory voices/beliefs?: No Depression: Yes Depression Symptoms: Insomnia Substance abuse history and/or treatment for substance abuse?: No Suicide prevention  information given to non-admitted patients: Not applicable  Risk to Others Homicidal Ideation: No Thoughts of Harm to Others: No Current Homicidal Intent: No Current Homicidal Plan: No Access to Homicidal Means: No Identified Victim: none History of harm to others?: No Assessment of Violence: None Noted Violent Behavior Description: pt was calm and cooperative Does patient have access to weapons?: No Criminal Charges Pending?: No Does patient have a court date: No  Psychosis Hallucinations: Auditory (ah-telling her to kill herself) Delusions: None noted  Mental Status Report Appear/Hygiene: Disheveled Eye Contact: Fair Motor Activity: Restlessness Speech: Logical/coherent Level of Consciousness: Restless Mood: Depressed;Anxious Affect: Anxious Anxiety Level: Minimal Thought Processes: Coherent;Relevant Judgement: Impaired Orientation: Person;Place;Time;Situation Obsessive Compulsive Thoughts/Behaviors: None  Cognitive Functioning Concentration: Normal Memory: Recent Intact;Remote Intact IQ: Average Insight: Fair Impulse Control: Poor Appetite: Good Weight Loss: 0  Weight Gain: 0    Sleep: Decreased Total Hours of Sleep:  (1-2 hours of sleep) Vegetative Symptoms: Decreased grooming  Prior Inpatient Therapy Prior Inpatient Therapy: Yes Prior Therapy Dates: Multiple admits over the past 5-6 yrs Prior Therapy Facilty/Provider(s): [name] Reason for Treatment: psychosis  Prior Outpatient Therapy Prior Outpatient Therapy: Yes Prior Therapy Dates: 2012-present Prior Therapy Facilty/Provider(s): [facility] Reason for Treatment: psychosis                     Additional Information 1:1 In Past 12 Months?: No CIRT Risk: No Elopement Risk: No Does patient have medical clearance?: Yes     Disposition:  Disposition Disposition of Patient: Inpatient treatment program Type of inpatient treatment program: Adult Patient referred to: Other (Comment) (Pt accepted to [facility] for psychiatric inpatient treatment )  On Site Evaluation by:   Reviewed with Physician:     Bjorn Pippin 07/30/2011 1:27 PM

## 2011-07-31 ENCOUNTER — Other Ambulatory Visit: Payer: Self-pay

## 2011-07-31 DIAGNOSIS — F339 Major depressive disorder, recurrent, unspecified: Secondary | ICD-10-CM

## 2011-07-31 DIAGNOSIS — F4001 Agoraphobia with panic disorder: Secondary | ICD-10-CM | POA: Diagnosis present

## 2011-07-31 DIAGNOSIS — F4310 Post-traumatic stress disorder, unspecified: Secondary | ICD-10-CM | POA: Diagnosis present

## 2011-07-31 DIAGNOSIS — F200 Paranoid schizophrenia: Principal | ICD-10-CM

## 2011-07-31 LAB — T4, FREE: Free T4: 1.05 ng/dL (ref 0.80–1.80)

## 2011-07-31 MED ORDER — LORAZEPAM 1 MG PO TABS
ORAL_TABLET | ORAL | Status: AC
  Filled 2011-07-31: qty 1

## 2011-07-31 MED ORDER — SERTRALINE HCL 50 MG PO TABS
50.0000 mg | ORAL_TABLET | Freq: Every day | ORAL | Status: DC
  Administered 2011-08-01 – 2011-08-05 (×5): 50 mg via ORAL
  Filled 2011-07-31 (×7): qty 1

## 2011-07-31 MED ORDER — IBUPROFEN 800 MG PO TABS: 800.0000 mg | ORAL_TABLET | Freq: Three times a day (TID) | ORAL | Status: DC | PRN

## 2011-07-31 MED ORDER — LORAZEPAM 1 MG PO TABS
1.0000 mg | ORAL_TABLET | ORAL | Status: AC
  Administered 2011-07-31: 1 mg via ORAL

## 2011-07-31 MED ORDER — RISPERIDONE 1 MG PO TBDP
ORAL_TABLET | ORAL | Status: AC
  Administered 2011-07-31: 2 mg via ORAL
  Filled 2011-07-31: qty 2

## 2011-07-31 MED ORDER — RISPERIDONE 2 MG PO TBDP
2.0000 mg | ORAL_TABLET | ORAL | Status: AC
  Administered 2011-07-31: 2 mg via ORAL

## 2011-07-31 MED ORDER — RISPERIDONE 2 MG PO TABS
4.0000 mg | ORAL_TABLET | ORAL | Status: DC
  Administered 2011-07-31 – 2011-08-02 (×4): 4 mg via ORAL
  Filled 2011-07-31 (×7): qty 2

## 2011-07-31 MED ORDER — CLONAZEPAM 0.5 MG PO TABS
0.5000 mg | ORAL_TABLET | Freq: Once | ORAL | Status: AC
  Administered 2011-07-31: 0.5 mg via ORAL
  Filled 2011-07-31: qty 1

## 2011-07-31 NOTE — Progress Notes (Signed)
Currently resting quietly in bed in left lateral position with eyes closed. Respirations are even and unlabored. No acute distress noted. Safety has been maintained with Q15 minute observation. Will continue current POC. 

## 2011-07-31 NOTE — Progress Notes (Signed)
Pt is anxious with depression, si thoughts and auditory hallucinations telling her to hurt herself. Gave scheduled anti-anxiety medication, support and redirection. Pt contracts with staff for safety.

## 2011-07-31 NOTE — [facility] Suicide Risk Assessment (Signed)
Suicide Risk Assessment  Admission Assessment     Demographic factors:  Assessment Details Time of Assessment: Admission Information Obtained From: Patient Current Mental Status:  Current Mental Status: Suicidal ideation indicated by patient Loss Factors:  Loss Factors: Loss of significant relationship;Financial problems / change in socioeconomic status Historical Factors:  Historical Factors: Prior suicide attempts Risk Reduction Factors:  Risk Reduction Factors: Responsible for children under 49 years of age  CLINICAL FACTORS:   Severe Anxiety and/or Agitation Panic Attacks Depression:   Anhedonia Delusional Hopelessness Insomnia Severe Schizophrenia:   Command hallucinatons Paranoid or undifferentiated type More than one psychiatric diagnosis Currently Psychotic Previous Psychiatric Diagnoses and Treatments  COGNITIVE FEATURES THAT CONTRIBUTE TO RISK:  None Noted.   Diagnosis:  Axis I: Schizophrenia - Paranoid Type.  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 Posttraumatic Stress Disorder. Panic Disorder with Agoraphobia.  The patient was seen today and reports the following:   ADL's: Intact.  Sleep: The patient reports to having significant difficulty initiating and maintaining sleep.  Appetite: The patient reports a good appetite today.   Mild>(1-10) >Severe  Hopelessness (1-10): 8  Depression (1-10): 9  Anxiety (1-10): 8-9   Suicidal Ideation: The patient reports suicidal ideations today but without plan or intent.  Plan: No  Intent: No  Means: No   Homicidal Ideation: The patient adamantly denies any homicidal ideations today.  Plan: No  Intent: No.  Means: No   General Appearance/Behavior: Appropriate and cooperative today but appears significantly depressed.  Eye Contact: Good.  Speech: Appropriate in rate and volume today with no pressuring noted.  Motor Behavior: wnl.  Level of Consciousness: Alert and Oriented x 3.  Mental Status: Alert  and Oriented x 3.  Mood: Severely depressed.  Affect: Moderately constricted.  Anxiety Level: Severe anxiety reported.  Thought Process: Reports both auditory and visual hallucinations.  Thought Content: The patient reports auditory hallucinations instructing her to harm herself.  They also tell her she is "useless and need to die."  She reports to seeing "shadow people" out of the corner of her eye.  She denies any delusional thinking today.  Perception:. Auditory and visual hallucinations continue. Judgment: Fair.  Insight: Fair.  Cognition: Oriented to person, place and time.   The patient was seen today and reports to having significant depressive symptoms.  She reports both auditory and visual hallucinations which have been under poor control for the last 2 weeks.  She reports that her son was recently diagnosed with a seizure disorder and cannot afford his seizure medication.  She states that this is one recent stressor which she feels may have contributed to the exacerbation of her symptoms.   She reports prior trials of Geodon, Zyprexa and Seroquel.  Treatment Plan Summary:  1. Daily contact with patient to assess and evaluate symptoms and progress in treatment  2. Medication management  3. The patient will deny suicidal ideations or homicidal ideations for 48 hours prior to discharge and have a depression and anxiety rating of 3 or less. The patient will also deny any auditory or visual hallucinations or delusional thinking.   Plan:  1. Will continue current medications.  2. Will increase the medication Risperdal to 4 mgs po q am and hs to further address her psychosis.  3. Will start the medication Zoloft 50 mgs po q am to further address her depression and anxiety. 4. Laboratory studies reviewed.  5. EKG ordered to evaluate QTc since the patient is taking Elavil 100 mgs po  qhs and would like this increased to 200 mgs po qhs for sleep and pain. 6. Will order a serum Depakote Level  for tonight. 7. Will continue to monitor.   SUICIDE RISK:   Moderate:  Frequent suicidal ideation with limited intensity, and duration, some specificity in terms of plans, no associated intent, good self-control, limited dysphoria/symptomatology, some risk factors present, and identifiable protective factors, including available and accessible social support.  Kylan Liberati 07/31/2011, 1:52 PM

## 2011-07-31 NOTE — Progress Notes (Signed)
Adult Psychosocial Assessment Update Interdisciplinary Team  Previous [facility] admissions/discharges:  Admissions Discharges  Date:  03/13/12 Date:  Date: Date:  Date: Date:  Date: Date:  Date: Date:   Changes since the last Psychosocial Assessment (including adherence to outpatient mental health and/or substance abuse treatment, situational issues contributing to decompensation and/or relapse). Patient's friend, her husband and 71 year old child have moved in with them. Patient is compliant with appointments and medications. Hearing voices and experiencing visual hallucinations of where she sees people out of the corner of her eye waiting for her. Increased anxiety and panic attacks.             Discharge Plan 1. Will you be returning to the same living situation after discharge?   Yes:X No:      If no, what is your plan?    Patient lives with her husband, and son. Currently friends have moved in with them.       2. Would you like a referral for services when you are discharged? Yes:  X   If yes, for what services?  No:       Patient had been seen by Dr. Jennette Kettle and has an appointment on May 1, but they are no longer taking her [facility] so she will need to find another provider instead of paying out of pocket.       Summary and Recommendations (to be completed by the evaluator) Patient is a 41 year old white female with diagnosis of Bipolar D/O, most recent episode depressed, severe with psychotic features. Patient was admitted with suicidal ideation with the voices telling her to cut her wrist. She was also experiencing visual hallucinations. Patient has a history of two past suicide attempts.   Patient will benefit from crisis stabilization, medication evaluation, group therapy and psycho-education groups to work on coping skills, case management for referrals and counselor to contact family for collateral and for suicide prevention information.                        Signature:  Veto Kemps, 07/31/2011 8:33 AM

## 2011-07-31 NOTE — Progress Notes (Addendum)
Patient in bed at the beginning of this shift awake. She appeared sad and depressed and seemed very anxious. Pt reported that the voices were making her uncomfortable and she was very anxious. Vistaril 50 mg offered to the patient. Patient received this medication without any problem and went to cafeteria for supper. Writer to reassess patient after supper. Q 15 minute check continues for safety.   1940: Pt came to the window and asked to speak with the Clinical research associate. "I feel very anxious, so depressed , hearing voices telling me to hurt my self, I don't want to hurt myself but the voices are so loud". Writer sat with patient for some minutes and encouraged patient. She stated that her stressor and symptom started getting worse about 3 weeks ago when her 21 yr old son went into coma for 9 hrs. She also reported that she feels she's been taken her medications for so long and they are no longer effective.  Writer encouraged patient to be strong for herself and her children. Writer notified Dr Lolly Mustache; he ordered klonopin 0.5mg  and a repeat dose if needed. Patient received medication and seems happy about that. Q 15 min check continues to maintain safety.

## 2011-07-31 NOTE — Progress Notes (Signed)
Currently resting quietly in bed in left lateral position with eyes closed. Respirations are even and unlabored. No acute pain/discomfort/ditress noted. Safety has been maintained with Q15 minute observation. Will continue current POC. 
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
## 2011-07-31 NOTE — Discharge Planning (Signed)
Met with patient in Aftercare Planning Group.   She was not able to stay long, but left fairly early in group, stating "I just can't do this."  Case Manager was informed that patient does not want to go back to [facility] in [place], because they are not currently accepting her [employer].  When we meet again, CM will suggest [facility] in [place].  Ambrose Mantle, LCSW 07/31/2011, 2:34 PM

## 2011-07-31 NOTE — Tx Team (Signed)
Interdisciplinary Treatment Plan Update (Adult)  Date:  07/31/2011  Time Reviewed:  10:15AM-11:00AM  Progress in Treatment: Attending groups:  New, attempted to attend Aftercare Planning Group but felt unable to stay Participating in groups:    New Taking medication as prescribed:  New   Tolerating medication:   New Family/Significant other contact made:  New Patient understands diagnosis:   New Discussing patient identified problems/goals with staff:   New Medical problems stabilized or resolved:   New Denies suicidal/homicidal ideation:  No Issues/concerns per patient self-inventory:   New Other:    New problem(s) identified: No, Describe:    Reason for Continuation of Hospitalization: Anxiety Depression Hallucinations Medication stabilization Suicidal ideation Other; describe Not sleeping  Interventions implemented related to continuation of hospitalization:  Medication monitoring and adjustment, safety checks Q15 min., suicide risk assessment, group therapy, psychoeducation, collateral contact, aftercare planning, ongoing physician assessments, medication education  Additional comments:  Not applicable  Estimated length of stay:  4-5 days  Discharge Plan:  Return to home with husband, wants to change provider from [facility] in [place] to someone who will take her medications.  New goal(s):  Not applicable  Review of initial/current patient goals per problem list:   1.  Goal(s):  Reduce auditory and visual hallucinations to baseline.  Met:  No  Target date:  By Discharge   As evidenced by:  Voices are telling her to kill herself  2.  Goal(s):  Reduce panic attacks/anxiety from 10 at admission to no more than 3 at discharge.  Met:  No  Target date:  By Discharge   As evidenced by:  "8-9" today  3.  Goal(s):  Deny SI for 48 hours prior to D/C.  Met:  No  Target date:  By Discharge   As evidenced by:  Has strong SI today, but can contract for  safety on the unit ("If a number, would be 9.")  4.  Goal(s):  Reduce depression from 10 at admission to no more than 3 at discharge.  Met:  No  Target date:  By Discharge   As evidenced by:  "9" today  5.  Goal(s):  Return sleep to normal pattern of 5.5-6.5 hours per night.  Met:  No  Target date:  By Discharge   As evidenced by:  Poor sleep reported by patient, documented as 6.5 hours in Epic.  6.  Goal(s):  Schedule with new aftercare provider(s).  Met:  No  Target date:  By Discharge   As evidenced by:  Just found out today that patient would like to switch from [facility] in [place].  Attendees: Patient:  Unable to attend due to deep sleep, unwilling to awaken or attend 07/31/2011 10:15AM-11:00AM  Family:     Physician:  Dr. Harvie Heck Readling 07/31/2011 10:15AM-11:00AM  Nursing:   Waynetta Sandy, RN 07/31/2011 10:15AM -11:00AM   Case Manager:  Ambrose Mantle, LCSW 07/31/2011 10:15AM-11:00AM  Counselor:  Veto Kemps, MT-BC 07/31/2011 10:15AM-11:00AM  Other:   Lynann Bologna, NP 07/31/2011 10:15AM-11:00AM  Other:      Other:      Other:       Scribe for Treatment Team:   Sarina Ser, 07/31/2011, 10:15AM-11:15AM

## 2011-08-01 MED ORDER — NICOTINE 21 MG/24HR TD PT24
21.0000 mg | MEDICATED_PATCH | Freq: Every day | TRANSDERMAL | Status: DC
Start: 2011-08-01 — End: 2011-08-09
  Administered 2011-08-01 – 2011-08-09 (×9): 21 mg via TRANSDERMAL
  Filled 2011-08-01 (×12): qty 1

## 2011-08-01 MED ORDER — DIVALPROEX SODIUM ER 500 MG PO TB24
1500.0000 mg | ORAL_TABLET | Freq: Every day | ORAL | Status: DC
  Administered 2011-08-01 – 2011-08-08 (×8): 1500 mg via ORAL
  Filled 2011-08-01 (×2): qty 3
  Filled 2011-08-01 (×2): qty 15
  Filled 2011-08-01 (×7): qty 3

## 2011-08-01 NOTE — Progress Notes (Signed)
Patient reports hearing voices this morning and that it is becoming intolerable.  She took her morning medications then went to group.  She was not able to sit through group stating the voices were just too bothersome.  States she is having near constant thoughts of suicide without intent or plan.  She was asked to sit in a chair in the hallway and she was given some hydroxyzine 50 mg, which she states was somewhat helpful in relieving her anxiety.  At this time she agrees to seek out staff if feeling unsafe and is safe on the unit.

## 2011-08-01 NOTE — Discharge Planning (Signed)
Met with patient in Aftercare Planning Group.   Her affect was severely constricted and her anxiety level was notably high.  She did agree that she wants to find a different psychiatrist because her current provider is not taking her insurance and it costs $60 for each visit.  Case Manager discussed that the [facility] is right in her town, [place], so would be both convenient and likely takes her insurance.  She has previously been set up with them for counseling but did not go because of issues having to do with paying a co-pay.  She now states that she would like the referral at least for psychiatrist, and perhaps for counseling.  Case Manager will confirm that [facility] takes her insurance, will set appointment as patient gets closer to discharge.  Ambrose Mantle, LCSW 08/01/2011, 1:33 PM

## 2011-08-01 NOTE — Progress Notes (Signed)
[facility] Group Notes:  (Counselor/Nursing/MHT/Case Management/Adjunct)  08/01/2011 3:38 PM  Type of Therapy:  Psychoeducational Skills  07/31/11  Participation Level:  Did Not Attend   Danielle [name] 08/01/2011, 3:38 PM

## 2011-08-01 NOTE — Progress Notes (Signed)
[facility] Group Notes:  (Counselor/Nursing/MHT/Case Management/Adjunct)  08/01/2011 3:39 PM  Type of Therapy:  Group Therapy  Participation Level:  Active  Participation Quality:  Attentive and Sharing  Affect:  Depressed  Cognitive:  Hallucinating  Insight:  Limited  Engagement in Group:  Good  Engagement in Therapy:  Good  Modes of Intervention:  Education, Support and Exploration  Summary of Progress/Problems: Patient talked about staying in bed all day long. Did not see a problems with this. Stated the people living with them are now cleaning up the house. Before she stated it just didn't get done. When asked what her husband thought of her staying in bed all the time she stated he works 3rd shift and sleeps a lot during the day. Stated that she does this to knock out the voices and the suicidal thoughts. Stated she had no support except for husband's family members. Did not show motivation to create new supports. Patient left group early. She did not appear to be responding to internal stimuli at the time.   [name] 08/01/2011, 3:39 PM

## 2011-08-01 NOTE — Progress Notes (Signed)
Patient ID: [name], female   DOB: 10-27-1970, 41 y.o.   MRN: [id number] [name] was asleep in her room when I woke her up this morning. She wakens readily. She reports that she has not slept all night, although the staff records reveal that she slept 6.5 hours. She says the voices are worse than ever and she is not sure she can be safe on the unit.   She rates her depression a 9/10 today and the intensity of her suicidal thoughts in 8/10. She is also endorsing hopelessness 9/10. She reports that the voices aren't loud, she wants the voices quieted. I point out to her that she has been sleeping prior to my arrival. The voices are telling her that she should not be on this earth that she is no reason to keep on living. She is asking to have her Klonopin increased to 2 mg every morning and at bedtime. I told her I am reluctant to do this given the fact that she is sleeping soundly in the bed at all hours of the day and not attending groups.  She is vacillating on whether or not she can be safe on the unit. She says she thinks she could be safe if she has someone here in the room with her that she could talk to. I have taken her out of bed and brush her down the hall to sit in a chair next to one of the technicians. She can be around people. Her nurse is going to continue to monitor her.  1345: Her nurse reports that she is contracting for safety without difficulty and the nurse has confidence that she can be safe on the unit.  Diagnostic studies: Valproic acid level 36.6. Amitriptyline level is 190, nortriptyline level 164. Combined level is 354.  Plan: Increase Depakote to 1500 mg ER each bedtime. We'll recheck Depakote level on March 30. No other medication changes at this time. Will not increase amitriptyline at this time.

## 2011-08-02 MED ORDER — HALOPERIDOL 5 MG PO TABS
ORAL_TABLET | ORAL | Status: AC
  Filled 2011-08-02: qty 1

## 2011-08-02 MED ORDER — BENZTROPINE MESYLATE 1 MG PO TABS
1.0000 mg | ORAL_TABLET | ORAL | Status: DC
  Administered 2011-08-02 – 2011-08-09 (×14): 1 mg via ORAL
  Filled 2011-08-02 (×6): qty 1
  Filled 2011-08-02: qty 10
  Filled 2011-08-02 (×7): qty 1
  Filled 2011-08-02 (×2): qty 10
  Filled 2011-08-02 (×3): qty 1
  Filled 2011-08-02: qty 10

## 2011-08-02 MED ORDER — HALOPERIDOL 1 MG PO TABS
2.0000 mg | ORAL_TABLET | Freq: Every day | ORAL | Status: DC | PRN
  Administered 2011-08-02: 10:00:00 via ORAL

## 2011-08-02 MED ORDER — RISPERIDONE 3 MG PO TABS
3.0000 mg | ORAL_TABLET | ORAL | Status: DC
  Administered 2011-08-02 – 2011-08-09 (×14): 3 mg via ORAL
  Filled 2011-08-02: qty 1
  Filled 2011-08-02: qty 10
  Filled 2011-08-02: qty 1
  Filled 2011-08-02: qty 10
  Filled 2011-08-02 (×6): qty 1
  Filled 2011-08-02: qty 10
  Filled 2011-08-02 (×5): qty 1
  Filled 2011-08-02: qty 10
  Filled 2011-08-02 (×3): qty 1

## 2011-08-02 MED ORDER — HALOPERIDOL 2 MG PO TABS
2.0000 mg | ORAL_TABLET | ORAL | Status: DC
  Administered 2011-08-02 – 2011-08-05 (×7): 2 mg via ORAL
  Filled 2011-08-02 (×3): qty 1
  Filled 2011-08-02: qty 2
  Filled 2011-08-02 (×8): qty 1

## 2011-08-02 NOTE — Progress Notes (Signed)
[facility] Group Notes:  (Counselor/Nursing/MHT/Case Management/Adjunct)  08/02/2011 11:54 AM  Type of Therapy:  Group Therapy  Participation Level:  Active  Participation Quality:  Attentive and Sharing  Affect:  Anxious and Depressed  Cognitive:  Hallucinating  Insight:  Limited  Engagement in Group:  Good  Engagement in Therapy:  Good  Modes of Intervention:  Clarification, Education and Problem-solving  Summary of Progress/Problems: Patient talked about the continuous voices and this making her suicidal. Encouraged her to identify other coping skills to deal with the voices. She stated that she had already tried several things and nothing seems to work.   Lorance Pickeral, Aram Beecham 08/02/2011, 11:54 AM

## 2011-08-02 NOTE — Progress Notes (Signed)
Patient ID: Danielle [name], female   DOB: 1971-02-10, 41 y.o.   MRN: [id number] Pt sitting in her room stating that the voices are so loud in her head that she wants to bang her head against the wall. States that she does not want to hurt herself at all. States that the voices are derogatory telling her she is worthless, bad and and they will kill her so she should kill herself first. Pt states the only time she doesn't hear the voices is when she is being held by her husband "they will go away then". Stated she was having trouble contracting for safety, but was willing to sit in the dayroom where staff could watch and observe her. Given support, reassurance and praise for coming to staff. Also Pt. Was given Haldol 5mg  po for her voices.
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
## 2011-08-02 NOTE — Progress Notes (Signed)
Patient ID: Danielle [name], female   DOB: 21-Sep-1970, 41 y.o.   MRN: [id number] Pt was lying in bed in her room crying loudly at the beginning of the shift.  Stated the voices were back and they were telling her she was stupid and saying mean things about her.  Asked her if she was relating the voices to a particular event or something that had happened to her in her life and she became quiet and thoughtful.  We affirmed that she wasn't stupid and that the things they  were saying were't true.  She said she missed her husband because he would hold her if she cried and told him she was hearing voices.  Encouraged her to come out to dayroom and watch tv.  She came out and began playing cards with a female peer. Later came to med window for her meds, said that she was feeling better but the voices hadn't gone away.  Was able to contract for safety.  Will continue to monitor.

## 2011-08-02 NOTE — Progress Notes (Signed)
Recreation Therapy Notes  08/02/2011         Time: 1115      Group Topic/Focus: The focus of the group is on enhancing the patients' ability to cope with stressors by understanding what coping is, why it is important, the negative effects of stress and developing healthier coping skills. Patients practice [place] and discuss how exercise can be used as a healthy coping strategy.  Participation Level: Minimal  Participation Quality: Attentive  Affect: Blunted  Cognitive: Oriented   Additional Comments: Patient came to group but when RT gave the patients the direction to remain seated in their chairs for the first portion of the activity, patient chose to sit on the floor. In doing so, patient bumped her head on the tabletop and legs of the table in the rear of the dayroom. Immediately following the incident patient reported feeling "fine," but a few minutes later stated she was dizzy and had a headache. Patient was escorted by RT out of the group room and MHT took patient to be seen by her nurse. RT checked-in with patient's nurse after group had concluded to let her know (in more detail) what had happened.   Neftaly Inzunza 08/02/2011 12:24 PM

## 2011-08-02 NOTE — Discharge Planning (Signed)
Met with patient in Aftercare Planning Group.   She complained throughout group about not sleeping last night, stating that she awoke every 2 hours at least.  She also stated that the voices and visual hallucinations are continual and no coping skills are assisting.  She feels frustrated with staff because the suggestions that they make are not helpful, as she feels she has already tried the techniques suggested.  Case Manager provided support and empathy while suggesting ways she can ask for additional assistance when she does not get the results that she desires.  Patient states that her husband is very supportive and helpful in containing symptoms.  Did utilization review for additional days.  Ambrose Mantle, LCSW 08/02/2011, 9:25 AM

## 2011-08-03 NOTE — Progress Notes (Signed)
Patient ID: Danielle [name], female   DOB: 06/19/1970, 41 y.o.   MRN: [id number]  [facility] Group Notes:  (Counselor/Nursing/MHT/Case Management/Adjunct)  08/03/2011 11 AM  Type of Therapy:  Group Therapy, Dance/Movement Therapy   Participation Level:  Minimal  Participation Quality:  Appropriate  Affect:  Appropriate  Cognitive:  Oriented  Insight:  Limited  Engagement in Group:  Limited  Engagement in Therapy:  Limited  Modes of Intervention:  Clarification, Problem-solving, Role-play, Socialization and Support  Summary of Progress/Problems: Therapist discussed with group healthy actions to encorparate into their lives when the pt. is feeling frustrated with everyday life situations. Therapist asked group "What healthy coping mechanism can you do when you are feeling frustrated".  Pt. stated that she talks to her husband and walks her dog".   Rhunette Croft

## 2011-08-03 NOTE — Progress Notes (Signed)
Patient ID: Danielle [name], female   DOB: February 10, 1971, 41 y.o.   MRN: [id number] Pt. attended and participated in aftercare planning group. Pt. accepted information on suicide prevention, warning signs to look for with suicide and crisis line numbers to use. The pt. agreed to call crisis line numbers if having warning signs or having thoughts of suicide. Pt. listed their current anxiety level as medium.  Pt. stated that she is feeling "ok like the color orange" "between red and white". Pt was active in group but began to vomit towards the end and left with assistance from the MHT and her nurse.

## 2011-08-03 NOTE — Progress Notes (Signed)
Writer observed patient sitting in the dayroom after group having snack. Writer met with patient one on one and discussed her hs medications to be given later and was agreeable to taking them. Patient reports that she is having trouble sleeping at night and inquired if she has anything else to help her rest. Patient was encouraged to take her scheduled hs medications and if awakens later on writer will give her visteril prn to see if it will help. Patient is agreeable to try this. Patient reports she has visual hallucinations of shadows and auditory hallucinations telling her to hurt herself, patient contracts verbally and currently denies having pain. Safety maintained on unit, patient is supported and encouoraged, will continue to monitor.

## 2011-08-03 NOTE — Progress Notes (Signed)
Patient ID: Danielle [name], female   DOB: 1970-10-22, 41 y.o.   MRN: [id number]   Danielle [name] presents a fully alert and oriented. She has been in group this morning. She says she slept better last night. She says the voices are less frequent, less intense as they were before, but are continuous. Family visited. Thinks meds are helping now  No new acute problems per pt.    MSE: alert, slurred speech, has VH, no si, no hi, logical, fair eye contact   Dx;  Schizophrenia  Plan:   Continue current treatment plan

## 2011-08-03 NOTE — Progress Notes (Signed)
Pt is quiet and slow with thought blocking observed. Confirms voices that tell her she is "worthless and stupid" but that the haldol is working "a little bit".  "Off and on" SI  And contracts for safety.  Rates depression and hopelessness at 7.  Reports dizziness and WC offered at lunchtime.  Speech is garbled but content is logical.  Some urinary incontinense and bed linens changed.  Support offered and encouraged ventilation of feelings. 15' checks cont for safety.

## 2011-08-04 NOTE — Progress Notes (Signed)
[facility] Group Notes:  (Counselor/Nursing/MHT/Case Management/Adjunct)  08/04/2011 11 AM  Type of Therapy:  Group Therapy, Dance/Movement Therapy   Participation Level:  Did Not Attend   Gregoria Selvy  

## 2011-08-04 NOTE — Progress Notes (Signed)
Patient ID: Danielle [name], female   DOB: 1970-12-30, 41 y.o.   MRN: [id number]   Danielle [name] presents a fully alert and oriented.  She says she slept better last night but is was interrupted sleep due to her room mate. She says the voices are less frequent, less intense as they were before. Does not want to act on the command AH now and can distract herself when it happens. Thinks meds are helping now and she is getting better now. Plans to stay with her husband after discharge.  No new acute problems per pt.    MSE: alert, slurred speech, has AVH (including command AH), no si, no hi, logical, fair eye contact   Dx;  Schizophrenia  Plan:   -Continue current treatment plan - recent depakote is low and will let primary team to adjust meds if needed

## 2011-08-04 NOTE — Progress Notes (Signed)
Pt is out in hallway this am. Speech is clearer than this writer's previous assessment.  "Off and on" SI and contracts for safety.  Confirms voices that tell say she is "worthless". Rates depression and hopelessness at 7 with poor sleep but good appetite.  Support offered and 15' checks cont for safety.

## 2011-08-04 NOTE — Progress Notes (Signed)
Writer was in patients room talking to her roommate Okey Regal and Hayes who was lying in bed resting c/o having a headache. Writer gave pt. 2 tylenol for her headache. Patient reported that she has been dizzy today and had to use a wheelchair earlier. Patient also reported that her husband had visited her today which made her feel better. Patient has +auditory and visual hall and reports that the voices are not as loud as they once were. Passive SI and contract verbally. Safety maintained, pt. supported and encouraged, will continue to monitor.

## 2011-08-05 LAB — TRICYCLIC ANTIDEPRESSANT EVALUATION
Amitriptyline and Nortriptyline: 354 mcg/L — ABNORMAL HIGH (ref 100–250)
Amitriptyline: 190 mcg/L
Clomipramine.: <5 mcg/L (ref 50–250)
Desemethylclomipramine: <5 mcg/L (ref 150–350)
Desipramine DPRAM: <5 mcg/L
Desmethyldoxepin: <5 mcg/L
Imipramine IPRAM: <5 mcg/L
Nortriptyline NTRIP: 164 mcg/L

## 2011-08-05 MED ORDER — HALOPERIDOL 5 MG PO TABS
5.0000 mg | ORAL_TABLET | Freq: Every day | ORAL | Status: DC
  Administered 2011-08-06 – 2011-08-08 (×3): 5 mg via ORAL
  Filled 2011-08-05 (×2): qty 1
  Filled 2011-08-05: qty 5
  Filled 2011-08-05: qty 1
  Filled 2011-08-05: qty 5
  Filled 2011-08-05: qty 1

## 2011-08-05 MED ORDER — SERTRALINE HCL 100 MG PO TABS
100.0000 mg | ORAL_TABLET | Freq: Every day | ORAL | Status: DC
  Administered 2011-08-06 – 2011-08-07 (×2): 100 mg via ORAL
  Filled 2011-08-05 (×4): qty 1

## 2011-08-05 NOTE — Progress Notes (Signed)
Has been visible in dayroom, watching TV and interacting with select peers. Appears flat and depressed. Calm and cooperative with assessment. No acute distress noted. States she feels like she is continuing to improve. States has AH at baseline and is usually hopeful that they decrease in intensity.  She feels like she is getting better as the voices are now a lot lower and are not telling her to hurt herself, although they are saying negative things about but they are tolerable. Support and encouragement provided. Asked to a medication list which was provided. Also asked what medication changes have been made. All changes reviewed and understanding verbalized. Otherwise no question or concerns. Denies SI/HI and contracts for safety. Denies pain. POC for the shift reviewed and understanding verbalized. Safety has been maintained with Q15 minute observation. Will continue current POC.

## 2011-08-05 NOTE — Progress Notes (Signed)
Patient ID: Danielle [name], female   DOB: [birth year], 41 y.o.   MRN: [id number] Patient reports having an okay day, states having visitor. Patient's sleep is slurred and slow. Patient reports feeling tired and ready for bed. Patient reports passive SI and auditory hallucinations. Patient reports command hallucinations. Denies HI. Patient requesting HS medications so that she can go to bed. Medications taken without incident. An hour after medications given, patient has been asleep in bed. No concerns or complications voiced. No pain voiced or observed. Monitor q15 minutes to ensure safety and continue plan of care. Patient remains safe while on the unit.

## 2011-08-05 NOTE — Progress Notes (Signed)
[facility] MD Progress Note  08/05/2011 2:52 PM  Diagnosis:  Axis I: Schizophrenia - Paranoid Type.  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  Posttraumatic Stress Disorder.  Panic Disorder with Agoraphobia.   The patient was seen today and reports the following:   ADL's: Intact.  Sleep: The patient reports to having significant difficulty maintaining sleep.  Appetite: The patient reports a good appetite today.   Mild>(1-10) >Severe  Hopelessness (1-10): 6-7  Depression (1-10): 7  Anxiety (1-10): 5   Suicidal Ideation: The patient reports suicidal ideations today but without plan or intent.  Plan: No  Intent: No  Means: No   Homicidal Ideation: The patient adamantly denies any homicidal ideations today.  Plan: No  Intent: No.  Means: No   General Appearance/Behavior: Appropriate and cooperative today but appears over-sedated today.  Eye Contact: Good.  Speech: Appropriate in rate and volume today but appears somewhat slurred.  Motor Behavior: wnl.  Level of Consciousness: Alert and Oriented x 3.  Mental Status: Alert and Oriented x 3.  Mood: Severely depressed.  Affect: Moderately constricted and sedated.  Anxiety Level: Moderate anxiety reported.  Thought Process: Reports both auditory and visual hallucinations.  Thought Content: The patient reports auditory hallucinations instructing her to harm herself. They also tell her she is "useless and need to die." She reports to seeing "shadow people" out of the corner of her eye. She denies any delusional thinking today.  She reports that the intensity of her hallucinations have diminished since admission. Perception:. Auditory and visual hallucinations continue but are less intense.  Judgment: Fair.  Insight: Fair.  Cognition: Oriented to person, place and time.  Sleep:  Number of Hours: 6.5    Vital Signs:Blood pressure 104/69, pulse 80, temperature 97.8 F (36.6 C), temperature source Oral, resp. rate 18, height 5\' 10"   (1.778 m), weight 96.616 kg (213 lb).  Current Medications: Current Facility-Administered Medications  Medication Dose Route Frequency Provider Last Rate Last Dose  . acetaminophen (TYLENOL) tablet 650 mg  650 mg Oral Q6H PRN Ronny Bacon, MD   650 mg at 08/03/11 2023  . alum & mag hydroxide-simeth (MAALOX/MYLANTA) 200-200-20 MG/5ML suspension 30 mL  30 mL Oral Q4H PRN Curlene Labrum , MD      . amitriptyline (ELAVIL) tablet 100 mg  100 mg Oral QHS Curlene Labrum , MD   100 mg at 08/04/11 2054  . benztropine (COGENTIN) tablet 1 mg  1 mg Oral BH-qamhs Viviann Spare, NP   1 mg at 08/05/11 0742  . clonazePAM (KLONOPIN) tablet 1 mg  1 mg Oral BH-[id number] Curlene Labrum , MD   1 mg at 08/05/11 1355  . divalproex (DEPAKOTE ER) 24 hr tablet 1,500 mg  1,500 mg Oral QHS Viviann Spare, NP   1,500 mg at 08/04/11 2054  . haloperidol (HALDOL) tablet 2 mg  2 mg Oral Daily PRN Viviann Spare, NP      . haloperidol (HALDOL) tablet 5 mg  5 mg Oral QHS Curlene Labrum , MD      . hydrOXYzine (ATARAX/VISTARIL) tablet 50 mg  50 mg Oral Q6H PRN Curlene Labrum , MD   50 mg at 08/03/11 0030  . ibuprofen (ADVIL,MOTRIN) tablet 800 mg  800 mg Oral TID PRN Viviann Spare, NP      . lamoTRIgine (LAMICTAL) tablet 200 mg  200 mg Oral QHS Curlene Labrum , MD   200 mg at 08/04/11 2055  . levothyroxine (SYNTHROID, LEVOTHROID) tablet 50 mcg  50 mcg Oral Q breakfast Curlene Labrum , MD   50 mcg at 08/04/11 0636  . magnesium hydroxide (MILK OF MAGNESIA) suspension 30 mL  30 mL Oral Daily PRN Curlene Labrum , MD      . nicotine (NICODERM CQ - dosed in mg/24 hours) patch 21 mg  21 mg Transdermal Daily Curlene Labrum , MD   21 mg at 08/05/11 0618  . prazosin (MINIPRESS) capsule 2 mg  2 mg Oral QHS Curlene Labrum , MD   2 mg at 08/04/11 2055  . risperiDONE (RISPERDAL) tablet 3 mg  3 mg Oral BH-qamhs Viviann Spare, NP   3 mg at 08/05/11 0741  . sertraline (ZOLOFT) tablet 100 mg  100 mg Oral Q breakfast Curlene Labrum  , MD      . DISCONTD: haloperidol (HALDOL) tablet 2 mg  2 mg Oral BH-qamhs Viviann Spare, NP   2 mg at 08/05/11 0742  . DISCONTD: sertraline (ZOLOFT) tablet 50 mg  50 mg Oral Q breakfast Ronny Bacon, MD   50 mg at 08/05/11 1610   Lab Results:  Results for orders placed during the hospital encounter of 07/30/11 (from the past 48 hour(s))  VALPROIC ACID LEVEL     Status: Normal   Collection Time   08/03/11  7:31 PM      Component Value Range Comment   Valproic Acid Lvl 63.5  50.0 - 100.0 (ug/mL)    The patient was seen today and reports to having difficulty maintaining sleep.  She reports a good appetite and continues to report severe depressive symptoms.  She reports ongoing suicidal ideations but with no plan or intent.  She also reports ongoing auditory and visual hallucinations but with the intensity decreasing since admission.  She denies any medication related side effects today.   Treatment Plan Summary:  1. Daily contact with patient to assess and evaluate symptoms and progress in treatment  2. Medication management  3. The patient will deny suicidal ideations or homicidal ideations for 48 hours prior to discharge and have a depression and anxiety rating of 3 or less. The patient will also deny any auditory or visual hallucinations or delusional thinking.   Plan:  1. Will continue current medications.  2. Will increase the medication Zoloft to 100 mgs po q am to further address her depression and anxiety.  3. Will change Haldol dosing to 5 mgs po qhs from 2 mgs po q am and hs due to daytime sedation. 4. Laboratory studies reviewed.  5. Will continue to monitor.     08/05/2011, 2:52 PM

## 2011-08-05 NOTE — Discharge Planning (Signed)
Patient seen individually, and reported no case management needs.  Attempted calls to [facility] to register patient as new there, get follow-up in place.  No answer.  Did utilization review for additional days.  Ambrose Mantle, LCSW 08/05/2011, 5:15 PM

## 2011-08-05 NOTE — Progress Notes (Signed)
Report received from [employer] [facility] fundraiser. Writer observed patient lying in bed asleep eyes closed resp. even and unlabored no signs of distress noted. Safety maintained on unit, will continue to monitor.

## 2011-08-05 NOTE — Progress Notes (Signed)
Patient ID: Danielle [name], female   DOB: 1970/10/09, 41 y.o.   MRN: [id number] Pt denied HI/SI and AVH on assessment this am but later reported on her self-inventory SI on and off but does agree to contact staff if SI increases or she feels like acting on these thoughts.  Danielle [name] also reported that her sleep was poor, appetite good, low energy level, and poor ability to pay attention.  8/10 on depression and hopelessness scale.  No physical pain this am but headaches over the past 24  hours.  Her goal after discharge is to find support in her area and take her medications.

## 2011-08-05 NOTE — Progress Notes (Signed)
[facility] Group Notes:  (Counselor/Nursing/MHT/Case Management/Adjunct)  08/05/2011 2:13 PM  Type of Therapy:  Group Therapy  Participation Level:  Minimal  Participation Quality:  Attentive  Affect:  Blunted  Cognitive:  Alert  Insight:  Poor  Engagement in Group:  Limited  Engagement in Therapy:  Limited  Modes of Intervention:  Clarification, Education, Limit-setting, Orientation, Problem-solving, Socialization and Support  Summary of Progress/Problems:Summary of Progress/Problems: Pt participated in group by listening attentively and openly disclosing. Therapist prompted Pt to state 2 things she liked about herself.  Therapist asked Pt to identify one thing she would like to change and what she could do to make that change.  Pt reported that she enjoyed reading and playing with her grandchildren.  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  Therapist offered a variety of suggestions. Therapist emphasized the importance of engaging in positive activities in order to maintain a balanced lifestyle.  Minimal progress noted.  Intervention effective.      Marni Griffon C 08/05/2011, 2:13 PM

## 2011-08-06 NOTE — Progress Notes (Signed)
[facility] Group Notes:  (Counselor/Nursing/MHT/Case Management/Adjunct)  08/06/2011 4:02 PM   Type of Therapy:  Group Therapy  Participation Level:  Minimal  Participation Quality:  Attentive  Affect:  Depressed  Cognitive:  Alert  Insight:  Fair  Engagement in Group:  Limited  Engagement in Therapy:  Limited  Modes of Intervention:  Clarification, Education, Limit-setting, Orientation, Problem-solving, Socialization and Support  Summary of Progress/Problems:Summary of Progress/Problems: Pt participated in group by listening attentively and openly disclosing. Therapist prompted Pt to state 2 things she liked about herself.  Therapist asked Pt to identify one thing she would like to change and what she could do to make that change.  Pt reported that she loved herself for coming to the hospital to get help.  She disclosed that she needed to start listening.  Therapist emphasized the importance of engaging in positive activities in order to maintain a balanced lifestyle. Therapist praised Pt for progress made.  Therapist prompted patients to join in the Affirmations Activity.  Pt gave and received positive affirmations and smiled. Some progress noted.  Intervention effective.      Christen Butter 08/06/2011, 4:02 PM

## 2011-08-06 NOTE — Progress Notes (Signed)
[facility] MD Progress Note  08/06/2011 1:07 PM  Diagnosis:  Axis I: Schizophrenia - Paranoid Type.  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  Posttraumatic Stress Disorder.  Panic Disorder with Agoraphobia.   The patient was seen today and reports the following:   ADL's: Intact.  Sleep: The patient reports to having significant difficulty initiating and maintaining sleep. The patient however has been snoring loudly and may have undiagnosed sleep apnea. Appetite: The patient reports a good appetite today.   Mild>(1-10) >Severe  Hopelessness (1-10): 6  Depression (1-10): 6  Anxiety (1-10): 7-8   Suicidal Ideation: The patient denies any suicidal ideations today.  Plan: No  Intent: No  Means: No   Homicidal Ideation: The patient adamantly denies any homicidal ideations today.  Plan: No  Intent: No.  Means: No   General Appearance/Behavior: Appropriate and cooperative today.  Eye Contact: Good.  Speech: Appropriate in rate and volume today with no slurring noted.  Motor Behavior: wnl.  Level of Consciousness: Alert and Oriented x 3.  Mental Status: Alert and Oriented x 3.  Mood: Moderately depressed.  Affect: Moderately constricted.  Anxiety Level: Moderate to severe anxiety reported.  Thought Process: Reports both auditory hallucinations.  Thought Content: The patient reports auditory hallucinations but states the intensity is "now down to a whisper."  She denies any visual hallucinations today or delusional thinking. Perception:. Auditory hallucinations continue but are less intense.  Judgment: Fair.  Insight: Fair.  Cognition: Oriented to person, place and time.  Sleep:  Number of Hours: 3.5    Vital Signs:Blood pressure 104/74, pulse 85, temperature 96.9 F (36.1 C), temperature source Oral, resp. rate 20, height 5\' 10"  (1.778 m), weight 96.616 kg (213 lb).  Current Medications: Current Facility-Administered Medications  Medication Dose Route Frequency Provider  Last Rate Last Dose  . acetaminophen (TYLENOL) tablet 650 mg  650 mg Oral Q6H PRN Ronny Bacon, MD   650 mg at 08/03/11 2023  . alum & mag hydroxide-simeth (MAALOX/MYLANTA) 200-200-20 MG/5ML suspension 30 mL  30 mL Oral Q4H PRN Curlene Labrum Deandre Stansel, MD      . amitriptyline (ELAVIL) tablet 100 mg  100 mg Oral QHS Curlene Labrum Ariel Wingrove, MD   100 mg at 08/05/11 2150  . benztropine (COGENTIN) tablet 1 mg  1 mg Oral BH-qamhs Viviann Spare, NP   1 mg at 08/06/11 0802  . clonazePAM (KLONOPIN) tablet 1 mg  1 mg Oral BH-q8a2phs Curlene Labrum Odeal Welden, MD   1 mg at 08/06/11 0802  . divalproex (DEPAKOTE ER) 24 hr tablet 1,500 mg  1,500 mg Oral QHS Viviann Spare, NP   1,500 mg at 08/05/11 2150  . haloperidol (HALDOL) tablet 2 mg  2 mg Oral Daily PRN Viviann Spare, NP      . haloperidol (HALDOL) tablet 5 mg  5 mg Oral QHS Curlene Labrum Pieter Fooks, MD      . hydrOXYzine (ATARAX/VISTARIL) tablet 50 mg  50 mg Oral Q6H PRN Curlene Labrum Abran Gavigan, MD   50 mg at 08/03/11 0030  . ibuprofen (ADVIL,MOTRIN) tablet 800 mg  800 mg Oral TID PRN Viviann Spare, NP      . lamoTRIgine (LAMICTAL) tablet 200 mg  200 mg Oral QHS Curlene Labrum Austan Nicholl, MD   200 mg at 08/05/11 2150  . levothyroxine (SYNTHROID, LEVOTHROID) tablet 50 mcg  50 mcg Oral Q breakfast Curlene Labrum Jerilynn Feldmeier, MD   50 mcg at 08/06/11 0646  . magnesium hydroxide (MILK OF MAGNESIA) suspension 30  mL  30 mL Oral Daily PRN Curlene Labrum Olukemi Panchal, MD      . nicotine (NICODERM CQ - dosed in mg/24 hours) patch 21 mg  21 mg Transdermal Daily Curlene Labrum Paulett Kaufhold, MD   21 mg at 08/06/11 0804  . prazosin (MINIPRESS) capsule 2 mg  2 mg Oral QHS Curlene Labrum Aithana Kushner, MD   2 mg at 08/05/11 2150  . risperiDONE (RISPERDAL) tablet 3 mg  3 mg Oral BH-qamhs Viviann Spare, NP   3 mg at 08/06/11 0803  . sertraline (ZOLOFT) tablet 100 mg  100 mg Oral Q breakfast Curlene Labrum Toney Lizaola, MD   100 mg at 08/06/11 0802  . DISCONTD: haloperidol (HALDOL) tablet 2 mg  2 mg Oral BH-qamhs Viviann Spare, NP   2 mg at 08/05/11  0742  . DISCONTD: sertraline (ZOLOFT) tablet 50 mg  50 mg Oral Q breakfast Curlene Labrum Everli Rother, MD   50 mg at 08/05/11 0741   Lab Results: No results found for this or any previous visit (from the past 48 hour(s)).  The patient was seen today and continues to reports significant difficulty initiating and maintaining sleep. According to staff, the patient is snoring very loudly at night which may represent an undiagnosed sleep apnea.  With this concern, it would not be prudent to add or increase any sleep medications at this time.  The patient reports that the intensity of her hallucinations continue to decrease.  She has no complaints today.  Treatment Plan Summary:  1. Daily contact with patient to assess and evaluate symptoms and progress in treatment  2. Medication management  3. The patient will deny suicidal ideations or homicidal ideations for 48 hours prior to discharge and have a depression and anxiety rating of 3 or less. The patient will also deny any auditory or visual hallucinations or delusional thinking.   Plan:  1. Will continue current medications.  2. Laboratory studies reviewed.  3. Will continue to monitor.  4. Patient is requesting a family meeting this week and the Case Manager is arranging this for Friday.  Keyari Kleeman 08/06/2011, 1:07 PM

## 2011-08-06 NOTE — Progress Notes (Signed)
Pt. Attended and participated in group this evening.  Reports that the voices are calmer and not telling her to harm herself but they do tell her  "you don't deserve to live."  Pt. Calm.  Support given.

## 2011-08-06 NOTE — Tx Team (Signed)
Interdisciplinary Treatment Plan Update (Adult)  Date:  08/06/2011  Time Reviewed:  10:15AM-11:00AM  Progress in Treatment: Attending groups:  Yes, sometimes (when not sleeping) Participating in groups:    Sleeping through group Taking medication as prescribed:    Yes Tolerating medication:   Yes Family/Significant other contact made:  Yes Patient understands diagnosis:   Yes Discussing patient identified problems/goals with staff:   Yes Medical problems stabilized or resolved:   Yes Denies suicidal/homicidal ideation:  Yes Issues/concerns per patient self-inventory:   None Other:    New problem(s) identified: No, Describe:    Reason for Continuation of Hospitalization: Anxiety Depression Hallucinations Medication stabilization Other; describe Not sleeping, poor appetite  Interventions implemented related to continuation of hospitalization:  Medication monitoring and adjustment, safety checks Q15 min., suicide risk assessment, group therapy, psychoeducation, collateral contact, aftercare planning, ongoing physician assessments, medication education  Additional comments:  Not applicable  Estimated length of stay:  3-4 days  Discharge Plan:  Go home with husband, follow up with [facility] in [facility]):  Not applicable  Review of initial/current patient goals per problem list:   1.  Goal(s):  Reduce auditory and visual hallucinations to baseline.  Met:  Yes  Target date:  By Discharge   As evidenced by:  Just a "whisper" now  2.  Goal(s):  Reduce panic attacks/anxiety from 10 at admission to no more than 3 at discharge.  Met:  No  Target date:  By Discharge   As evidenced by:  "7-8" today  3.  Goal(s):  Deny SI for 48 hours prior to D/C.  Met:  No  Target date:  By Discharge   As evidenced by:  Denies SI today, but needs 48 hours  4.  Goal(s):  Reduce depression from 10 at admission to no more than 3 at discharge.  Met:  No  Target  date:  By Discharge   As evidenced by:  "6" today  5. Goal(s): Return sleep to normal pattern of 5.5-6.5 hours per night.  Met: No  Target date: By Discharge  As evidenced by: Poor sleep reported by patient, documented as 3.5 hours in Epic.  6. Goal(s): Schedule with new aftercare provider(s).  Met: No  Target date: By Discharge  As evidenced by: Pt continues to say wants to switch  Attendees: Patient:  Danielle [name]  08/06/2011 10:15AM-11:00AM  Family:     Physician:  Dr. Harvie Heck Readling 08/06/2011 10:15AM-11:00AM  Nursing:   Izola Price, RN 08/06/2011 10:15AM -11:00AM   Case Manager:  Ambrose Mantle, LCSW 08/06/2011 10:15AM-11:00AM  Counselor:  Marni Griffon, LCAS 08/06/2011 10:15AM-11:00AM  Other:     Other:      Other:      Other:       Scribe for Treatment Team:   Sarina Ser, 08/06/2011, 10:15AM-11:15AM

## 2011-08-06 NOTE — Progress Notes (Signed)
Currently resting quietly in bed with eyes closed. Respirations are heavy and unlabored. Significant snore noted, but no apneic periods noted. No acute distress noted. No subjective signs of pain. Safety has been maintained with Q15 minute observation. Will continue current POC.

## 2011-08-06 NOTE — Progress Notes (Signed)
COLLATERAL  TEL CON  Therapist talked with Pt's husband to schedule a session with the treatment team for Friday, April 08/09/11, at 10:30 am.  Pt's husband reported that he did not get off work until 10:30am but will be here as soon as he can between 10:30 and 11:00am

## 2011-08-06 NOTE — Progress Notes (Signed)
Patient up and in the milieu today.  She stated she was unable to go down to lunch today because her knees hurt.  She was escorted back to her room and a tray was brought back for her.  She states that the voices are much less intense today and are lessening every day.  She also states she is missing 2 pair of jeans and she believes they have been stolen.  A quick search of the unit and the laundry room does not show up any of her missing clothing.  She has tolerated medication today.  Has been pleasant and cooperative with staff and peers.

## 2011-08-07 MED ORDER — SERTRALINE HCL 50 MG PO TABS
150.0000 mg | ORAL_TABLET | Freq: Every day | ORAL | Status: DC
  Administered 2011-08-08 – 2011-08-09 (×2): 150 mg via ORAL
  Filled 2011-08-07: qty 15
  Filled 2011-08-07 (×3): qty 3
  Filled 2011-08-07: qty 15

## 2011-08-07 MED ORDER — AMITRIPTYLINE HCL 25 MG PO TABS
75.0000 mg | ORAL_TABLET | Freq: Every day | ORAL | Status: DC
  Administered 2011-08-07 – 2011-08-08 (×2): 75 mg via ORAL
  Filled 2011-08-07 (×2): qty 3
  Filled 2011-08-07 (×2): qty 15
  Filled 2011-08-07: qty 3

## 2011-08-07 NOTE — Progress Notes (Signed)
Pt has been drowsy much of the day.  Her speech remains slurred md aware.  She filled out her self-inventory this morning and rated both her depression and hopelessness a 5 and her anxiety a 6.  She stated,"the voices are getting better but I am still seeing people"  She did request her 1400 klonopin early so given around 1300.  Dr. Allena Katz did order her a C-PAP at bedtime and a 1:1 to sit with pt during hours of sleep.  AC aware.  He increased her zoloft to 150 to start in the morning and elavil down to 75 mg tonight.  She has not request any prn meds and has been up for most groups.  Pt is wanting to have a family meeting before she is discharged and the case manager is suppose to arrange the day and time.

## 2011-08-07 NOTE — Progress Notes (Signed)
[facility] MD Progress Note  08/07/2011 12:46 PM  Diagnosis:  Axis I: Schizophrenia - Paranoid Type.  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  Posttraumatic Stress Disorder.  Panic Disorder with Agoraphobia.   The patient was seen today and reports the following:   ADL's: Intact.  Sleep: The patient reports to continuing to have difficulty initiating and maintaining sleep. According to staff, she slept approximately 6 hours.  Appetite: The patient reports a good appetite today.   Mild>(1-10) >Severe  Hopelessness (1-10): 5  Depression (1-10): 6  Anxiety (1-10): 5  Suicidal Ideation: The patient again denies any suicidal ideations today.  Plan: No  Intent: No  Means: No   Homicidal Ideation: The patient adamantly denies any homicidal ideations today.  Plan: No  Intent: No.  Means: No   General Appearance/Behavior: Appropriate and cooperative today but again appears oversedated.  Eye Contact: Good.  Speech: Appropriate in rate and volume today but again with slurring noted.  Motor Behavior: wnl.  Level of Consciousness: Alert and Oriented x 3.  Mental Status: Alert and Oriented x 3.  Mood: Moderately depressed.  Affect: Moderately constricted.  Anxiety Level: Moderate anxiety reported.  Thought Process: Reports both auditory hallucinations.  Thought Content: The patient reports auditory hallucinations continuing but with decreased intensity.  She denies any visual hallucinations today or delusional thinking.  Perception:. Auditory hallucinations continue but are less intense.  Judgment: Fair.  Insight: Fair.  Cognition: Oriented to person, place and time.  Sleep:  Number of Hours: 6    Vital Signs:Blood pressure 105/72, pulse 85, temperature 96.9 F (36.1 C), temperature source Oral, resp. rate 20, height 5\' 10"  (1.778 m), weight 96.616 kg (213 lb).  Current Medications: Current Facility-Administered Medications  Medication Dose Route Frequency Provider Last Rate  Last Dose  . acetaminophen (TYLENOL) tablet 650 mg  650 mg Oral Q6H PRN Ronny Bacon, MD   650 mg at 08/03/11 2023  . alum & mag hydroxide-simeth (MAALOX/MYLANTA) 200-200-20 MG/5ML suspension 30 mL  30 mL Oral Q4H PRN Curlene Labrum Mattye Verdone, MD      . amitriptyline (ELAVIL) tablet 75 mg  75 mg Oral QHS Nyzier Boivin D Noa Galvao, MD      . benztropine (COGENTIN) tablet 1 mg  1 mg Oral BH-qamhs Viviann Spare, NP   1 mg at 08/07/11 0749  . clonazePAM (KLONOPIN) tablet 1 mg  1 mg Oral [id number] Curlene Labrum Maheen Cwikla, MD   1 mg at 08/07/11 0749  . divalproex (DEPAKOTE ER) 24 hr tablet 1,500 mg  1,500 mg Oral QHS Viviann Spare, NP   1,500 mg at 08/06/11 2213  . haloperidol (HALDOL) tablet 2 mg  2 mg Oral Daily PRN Viviann Spare, NP      . haloperidol (HALDOL) tablet 5 mg  5 mg Oral QHS Curlene Labrum Jasiel Apachito, MD   5 mg at 08/06/11 2213  . hydrOXYzine (ATARAX/VISTARIL) tablet 50 mg  50 mg Oral Q6H PRN Ronny Bacon, MD   50 mg at 08/03/11 0030  . ibuprofen (ADVIL,MOTRIN) tablet 800 mg  800 mg Oral TID PRN Viviann Spare, NP      . lamoTRIgine (LAMICTAL) tablet 200 mg  200 mg Oral QHS Curlene Labrum Randee Huston, MD   200 mg at 08/06/11 2213  . levothyroxine (SYNTHROID, LEVOTHROID) tablet 50 mcg  50 mcg Oral Q breakfast Curlene Labrum Itzelle Gains, MD   50 mcg at 08/07/11 0749  . magnesium hydroxide (MILK OF MAGNESIA) suspension 30 mL  30 mL  Oral Daily PRN Curlene Labrum Zadrian Mccauley, MD      . nicotine (NICODERM CQ - dosed in mg/24 hours) patch 21 mg  21 mg Transdermal Daily Curlene Labrum Rahkim Rabalais, MD   21 mg at 08/07/11 0751  . prazosin (MINIPRESS) capsule 2 mg  2 mg Oral QHS Curlene Labrum Krystin Keeven, MD   2 mg at 08/05/11 2150  . risperiDONE (RISPERDAL) tablet 3 mg  3 mg Oral BH-qamhs Viviann Spare, NP   3 mg at 08/07/11 0749  . sertraline (ZOLOFT) tablet 150 mg  150 mg Oral Q breakfast Curlene Labrum Eathon Valade, MD      . DISCONTD: amitriptyline (ELAVIL) tablet 100 mg  100 mg Oral QHS Curlene Labrum Diantha Paxson, MD   100 mg at 08/06/11 2214  . DISCONTD: sertraline  (ZOLOFT) tablet 100 mg  100 mg Oral Q breakfast Curlene Labrum Jaziya Obarr, MD   100 mg at 08/07/11 0749   Lab Results: No results found for this or any previous visit (from the past 48 hour(s)).  The patient was seen today and continues to reports significant difficulty initiating and maintaining sleep. The patient appears oversedated this morning with slurred speech but again asked that her anxiety and sleep medication be increased.  It was discussed with the patient the possibility of trying C-PAP tonight to determine if she may sleep better.  The patient was in favor of this.  Treatment Plan Summary:  1. Daily contact with patient to assess and evaluate symptoms and progress in treatment  2. Medication management  3. The patient will deny suicidal ideations or homicidal ideations for 48 hours prior to discharge and have a depression and anxiety rating of 3 or less. The patient will also deny any auditory or visual hallucinations or delusional thinking.   Plan:  1. Will continue current medications.  2. Will decrease Elavil to 75 mgs po qhs due to excessive daytime sedation. 3. Will increase Zoloft to 150 mgs po q am for depression. 4. Laboratory studies reviewed.  5. Will continue to monitor.  6. Patient is requesting a family meeting this week and the Case Manager is arranging this for Friday.  Danielle [name] 08/07/2011, 12:46 PM

## 2011-08-07 NOTE — Discharge Planning (Signed)
Met with patient in Aftercare Planning Group.   She slept through most of group, even snoring at times.  When awakened to speak with Case Manager, she had no concerns or case management needs today.  She states that her husband feels she is doing better, as he visits daily.  Utilization review done for additional days.  Ambrose Mantle, LCSW 08/07/2011, 11:46 AM

## 2011-08-07 NOTE — Progress Notes (Signed)
Recreation Therapy Notes  08/07/2011         Time: 0930      Group Topic/Focus: The focus of this group is on enhancing patients' problem solving skills, which involves identifying the problem, brainstorming solutions and choosing and trying a solution.   Participation Level: Minimal  Participation Quality: Redirectable and Drowsy  Affect: Blunted  Cognitive: Alert   Additional Comments: Patient was sitting up in the chair, but clearly asleep and snoring. Patient would wake up and respond appropriately whenever asked a question.   Danielle [name] 08/07/2011 1:42 PM

## 2011-08-07 NOTE — Progress Notes (Signed)
Pt observed in the dayroom watching TV after group time.  She requested her meds early around 2100, but encouraged pt to wait until about 2130 as she is to use a CPAP for the first time tonight and has to be 1:1 while in bed.  Explained to pt how a CPAP works and what to expect.  Pt voices understanding.  Pt is able to contract for safety.  Pt does not appear sedated now as reported earlier in the shift.  She reports the voices have decreased.  Safety maintained with q15 minute checks.

## 2011-08-08 NOTE — Discharge Planning (Addendum)
Met with patient in Aftercare Planning Group.   She wanted to know what we will discuss in Treatment Team tomorrow with her husband.  Case Manager informed her of typical things covered during that time, which satisfied her.  She was much more alert today, with eyes completely open, no nodding off throughout group.  Seemed less depressed.  Stated this was all due to the CPAP machine she used last night.  She looks forward to being referred to a sleep study.  Patient continued to say she wants to go to the [facility], but when Case Manager called for an appointment, she was dismissed from that practice 04/2011, and cannot return.  Case Manager left phone message at [facility] in [place], where patient is currently seen, requesting an appointment for follow-up.  Awaiting call back.  Ambrose Mantle, LCSW 08/08/2011, 10:04 AM  Spoke with Dr. Evelena Asa, setting follow-up on 08/12/11 at 3pm.  Ambrose Mantle, LCSW 08/08/2011, 4:15 PM

## 2011-08-08 NOTE — Progress Notes (Signed)
Pt reports depression as a 5 on 1-10 scale with 10 being the most depressed. Pt is sleepy this morning and reports sleeping fair after starting her c-pap machine last night. Pt denies voices, si and hi at present time. Offered support and medications. Pt remains safe on unit.

## 2011-08-08 NOTE — Progress Notes (Signed)
Patient ID: Danielle [name], female   DOB: [birth year], 41 y.o.   MRN: [id number] Ernisha seen in consult room - she is fully alert and reports she slept very well last night, and thinks the CPAP with full face mask helped her.  Sleep much improved.  Today she rates her depression improved at 3/10 on 1-10 scale if 10 is the worst symptoms.  Anxiety 5/10, and denies hopelessness, denies SI and denies HI.    Her most recent VPA level was 63.5 on 08/03/2011.  She thinks the medicines are helping her and thinks she is doing pretty well.  She expresses interest in going home tomorrow.  Her husband is coming in for a family session and she is wondering if she can go home afterwards.   We discussed the need for medical follow-up for sleep problems, and what that would look like.  I will get her a follow-up appt with [facility] for evaluation and she agrees to sign a consent for me to send records to her PCP, Ardell Isaacs @ [facility].    Medication teaching reviewed and completed.    Plan: Findings reviewed with team in team meeting. Labs reviewed and current VPA level is therapeutic on current dose.  Team agrees that she can be ready to go home tomorrow if continues stable.   Have reviewed these findings and the plan with Dr. Allena Katz and he is in agreement.

## 2011-08-09 MED ORDER — SERTRALINE HCL 50 MG PO TABS: 150.0000 mg | ORAL_TABLET | Freq: Every day | ORAL | Status: DC

## 2011-08-09 MED ORDER — PRAZOSIN HCL 2 MG PO CAPS: 2.0000 mg | ORAL_CAPSULE | Freq: Every day | ORAL | Status: DC

## 2011-08-09 MED ORDER — LAMOTRIGINE 200 MG PO TABS: ORAL_TABLET | ORAL | Status: DC

## 2011-08-09 MED ORDER — AMITRIPTYLINE HCL 75 MG PO TABS: 75.0000 mg | ORAL_TABLET | Freq: Every day | ORAL | Status: DC

## 2011-08-09 MED ORDER — CLONAZEPAM 1 MG PO TABS: 1.0000 mg | ORAL_TABLET | ORAL | Status: DC

## 2011-08-09 MED ORDER — HALOPERIDOL 5 MG PO TABS: 5.0000 mg | ORAL_TABLET | Freq: Every day | ORAL | Status: DC

## 2011-08-09 MED ORDER — LEVOTHYROXINE SODIUM 50 MCG PO TABS: 50.0000 ug | ORAL_TABLET | Freq: Every day | ORAL | Status: DC

## 2011-08-09 MED ORDER — BENZTROPINE MESYLATE 1 MG PO TABS: 1.0000 mg | ORAL_TABLET | ORAL | Status: DC

## 2011-08-09 MED ORDER — RISPERIDONE 3 MG PO TABS: 3.0000 mg | ORAL_TABLET | ORAL | Status: DC

## 2011-08-09 MED ORDER — DIVALPROEX SODIUM ER 500 MG PO TB24: 1500.0000 mg | ORAL_TABLET | Freq: Every day | ORAL | Status: DC

## 2011-08-09 NOTE — Progress Notes (Signed)
08/09/2011         Time: 1115       Group Topic/Focus: The focus of the group is on enhancing the patients' ability to cope with stressors by understanding what coping is, why it is important, the negative effects of stress and developing healthier coping skills. Patients practice [place] and discuss how exercise can be used as a healthy coping strategy.  Participation Level: Minimal  Participation Quality: Resistant  Affect: Excited  Cognitive: Oriented  Additional Comments: Patient focused on discharge and the fact that her husband was staying to have lunch with her. Patient required frequent redirection but was too excited to really focus.   Jax Abdelrahman 08/09/2011 2:30 PM

## 2011-08-09 NOTE — Progress Notes (Signed)
[facility] Case Management Discharge Plan:  Will you be returning to the same living situation after discharge: Yes,  with husband At discharge, do you have transportation home?:Yes,  hsuband to transport Do you have the ability to pay for your medications:Yes,  income and insurance  Interagency Information:     Release of information consent forms completed and in the chart;  Patient's signature needed at discharge.  Patient to Follow up at:  Follow-up Information    Follow up with Dr. [name] on 08/12/2011. (3:00PM appointment - doctor would like to see you quickly after discharge!)    Contact information:   [address] [place], [place] [address]  Telephone:  [phone]      Follow up with Barbaraann Share, MD on 08/29/2011. (At 10:15am for evaluation of sleep apnea)    Contact information:   [address] [employer] [place] [address] [phone]          Patient denies SI/HI:   Yes,      Safety Planning and Suicide Prevention discussed:  Yes,  Today while patient was fully awake, during Aftercare Planning Group, Case Manager provided psychoeducation on "Suicide Prevention Information."  This included descriptions of risk factors for suicide, warning signs that an individual is in crisis and thinking of suicide, and what to do if this occurs.  Pt indicated understanding of information provided, and will read brochure given upon discharge.     Barrier to discharge identified:No.  Summary and Recommendations:  Patient will be discharged to stay with husband.  All follow-up is in place.  Sleep study would be helpful in being able to get patient to get the type of quality sleep she needs.   Sarina Ser 08/09/2011, 11:02 AM

## 2011-08-09 NOTE — Progress Notes (Signed)
Pt discharged to husband at this time, pt denies SI/HI, prescriptions and supply of medications provided, discharge instructions provided and pt verbalized understanding, all belongings returned

## 2011-08-09 NOTE — [facility] Suicide Risk Assessment (Signed)
Suicide Risk Assessment  Discharge Assessment     Demographic factors:  Caucasian   Current Mental Status Per Nursing Assessment::   On Admission:  Suicidal ideation indicated by patient At Discharge:  AO x 3.  No SI/HI.  No Auditory or visual hallucinations.  No delusional thinking.  Current Mental Status Per Physician:  Diagnosis:  Axis I: Schizophrenia - Paranoid Type.  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  Posttraumatic Stress Disorder.  Panic Disorder with Agoraphobia.   The patient was seen today and reports the following:   ADL's: Intact.  Sleep: The patient reports to sleeping much better since ordering C-PAP at night. Appetite: The patient reports a good appetite today.   Mild>(1-10) >Severe  Hopelessness (1-10): 0  Depression (1-10): 0  Anxiety (1-10): 5   Suicidal Ideation: The patient adamantly denies any suicidal ideations today.  Plan: No  Intent: No  Means: No   Homicidal Ideation: The patient adamantly denies any homicidal ideations today.  Plan: No  Intent: No.  Means: No   General Appearance/Behavior: Appropriate and cooperative today with no significant sedation noted since adding C-PAP at night.  Eye Contact: Good.  Speech: Appropriate in rate and volume today with no pressuring.  Motor Behavior: wnl.  Level of Consciousness: Alert and Oriented x 3.  Mental Status: Alert and Oriented x 3.  Mood: Euthymic.  Affect: Mildly constricted.  Anxiety Level: Moderate anxiety reported.  Thought Process: wnl.  Thought Content: The patient denies any auditory or visual hallucinations today.  She also denies any delusional thinking.  Perception:. wnl.  Judgment: Fair to Good.  Insight: Fair to Good.  Cognition: Oriented to person, place and time.   Current Medications:  Current Facility-Administered Medications   Medication  Dose  Route  Frequency  Provider  Last Rate  Last Dose   .  acetaminophen (TYLENOL) tablet 650 mg  650 mg  Oral  Q6H  PRN  Ronny Bacon, MD   650 mg at 08/03/11 2023   .  alum & mag hydroxide-simeth (MAALOX/MYLANTA) 200-200-20 MG/5ML suspension 30 mL  30 mL  Oral  Q4H PRN  Curlene Labrum Carmaleta Youngers, MD     .  amitriptyline (ELAVIL) tablet 75 mg  75 mg  Oral  QHS  Kennya Schwenn D Malesha Suliman, MD     .  benztropine (COGENTIN) tablet 1 mg  1 mg  Oral  BH-qamhs  Viviann Spare, NP   1 mg at 08/07/11 0749   .  clonazePAM (KLONOPIN) tablet 1 mg  1 mg  Oral  [id number]  Curlene Labrum Suleman Gunning, MD   1 mg at 08/07/11 0749   .  divalproex (DEPAKOTE ER) 24 hr tablet 1,500 mg  1,500 mg  Oral  QHS  Viviann Spare, NP   1,500 mg at 08/06/11 2213   .  haloperidol (HALDOL) tablet 2 mg  2 mg  Oral  Daily PRN  Viviann Spare, NP     .  haloperidol (HALDOL) tablet 5 mg  5 mg  Oral  QHS  Curlene Labrum Lakiah Dhingra, MD   5 mg at 08/06/11 2213   .  hydrOXYzine (ATARAX/VISTARIL) tablet 50 mg  50 mg  Oral  Q6H PRN  Ronny Bacon, MD   50 mg at 08/03/11 0030   .  ibuprofen (ADVIL,MOTRIN) tablet 800 mg  800 mg  Oral  TID PRN  Viviann Spare, NP     .  lamoTRIgine (LAMICTAL) tablet 200 mg  200 mg  Oral  QHS  Curlene Labrum Berlyn Saylor, MD   200 mg at 08/06/11 2213   .  levothyroxine (SYNTHROID, LEVOTHROID) tablet 50 mcg  50 mcg  Oral  Q breakfast  Curlene Labrum Emre Stock, MD   50 mcg at 08/07/11 0749   .  magnesium hydroxide (MILK OF MAGNESIA) suspension 30 mL  30 mL  Oral  Daily PRN  Curlene Labrum Kinston Magnan, MD     .  nicotine (NICODERM CQ - dosed in mg/24 hours) patch 21 mg  21 mg  Transdermal  Daily  Curlene Labrum Marwah Disbro, MD   21 mg at 08/07/11 0751   .  prazosin (MINIPRESS) capsule 2 mg  2 mg  Oral  QHS  Curlene Labrum Zilda No, MD   2 mg at 08/05/11 2150   .  risperiDONE (RISPERDAL) tablet 3 mg  3 mg  Oral  BH-qamhs  Viviann Spare, NP   3 mg at 08/07/11 0749   .  sertraline (ZOLOFT) tablet 150 mg  150 mg  Oral  Q breakfast  Curlene Labrum Cashae Weich, MD     .  DISCONTD: amitriptyline (ELAVIL) tablet 100 mg  100 mg  Oral  QHS  Curlene Labrum Analeese Andreatta, MD   100 mg at 08/06/11 2214   .  DISCONTD:  sertraline (ZOLOFT) tablet 100 mg  100 mg  Oral  Q breakfast  Curlene Labrum Sharetta Ricchio, MD   100 mg at 08/07/11 0749    Loss Factors: Loss of significant relationship;Financial problems / change in socioeconomic status  Historical Factors: Prior suicide attempts  Risk Reduction Factors:   Good Family Support.  Access to health care.  Continued Clinical Symptoms:  None Noted.  Discharge Diagnoses:   AXIS I:   Schizophrenia - Paranoid Type.    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    Posttraumatic Stress Disorder.    Panic Disorder with Agoraphobia.  AXIS II:   None Noted. AXIS III:   1.  Seizure Disorder.   2.  Heart Murmur.   3.  Hypothyroidism.   4.  Frequent Headaches.   5. Sleep Apnea. AXIS IV:   Chronic Mental Illness. AXIS V:   GAF at time of admission approximately 35.  GAF at time of discharge approximately 55.  Cognitive Features That Contribute To Risk:  None Noted.  Lab Results: No results found for this or any previous visit (from the past 48 hour(s)).   The patient reports today that she is sleeping much better with the C-PAP and feels much better physically and mentally.  The patient states that her depression has resolved with no SI/HI or any auditory or visual hallucinations or delusional thinking.  The patient has been set up for a sleep study and this was explained to her.  The patient's husband was seen today in treatment team along with the patient and he too states that he is pleased with her progress.  He states he has no concerns about the patient being discharged and will assist in making sure she takes her medications as prescribed and will help her with her follow up appointments.  Treatment Plan Summary:  1. Daily contact with patient to assess and evaluate symptoms and progress in treatment  2. Medication management  3. The patient will deny suicidal ideations or homicidal ideations for 48 hours prior to discharge and have a depression and anxiety  rating of 3 or less. The patient will also deny any auditory or visual hallucinations or delusional thinking.   Plan:  1. Will continue current medications.  2. Laboratory studies reviewed.  3. Will continue to monitor.  4. The patient will be discharged today to outpatient follow up.  Suicide Risk:  Minimal: No identifiable suicidal ideation.  Patients presenting with no risk factors but with morbid ruminations; may be classified as minimal risk based on the severity of the depressive symptoms  Plan Of Care/Follow-up recommendations:  Activity:  As tolerated. Diet:  Regular. Other:  The patient should take all medications only as directed and keep all scheduled follow up appointments including her sleep evaluation.  Benjamyn Hestand 08/09/2011, 11:14 AM

## 2011-08-09 NOTE — Tx Team (Signed)
Interdisciplinary Treatment Plan Update (Adult)  Date:  08/09/2011  Time Reviewed:  10:15AM-11:00AM  Progress in Treatment: Attending groups:  Yes Participating in groups:    Yes, fully engaged Taking medication as prescribed:    Yes Tolerating medication:    Yes Family/Significant other contact made:   Yes, husband at treatment team Patient understands diagnosis:    Yes Discussing patient identified problems/goals with staff:    Yes Medical problems stabilized or resolved:    Yes,  Denies suicidal/homicidal ideation:  Yes Issues/concerns per patient self-inventory:   None Other:    New problem(s) identified: No, Describe:    Reason for Continuation of Hospitalization: None  Interventions implemented related to continuation of hospitalization:  Medication monitoring and adjustment, safety checks Q15 min., suicide risk assessment, group therapy, psychoeducation, collateral contact, aftercare planning, ongoing physician assessments, medication education  Additional comments:  Patient's husband asks for SPECIFIC times to be listed on her medications  Estimated length of stay:  Discharge today  Discharge Plan:  Return home with husband, follow up with Dr. [name] on 08/12/11 at 3pm.  Go to evaluation for CPAP on 4/25 at [facility].  New goal(s):  Not applicable  Review of initial/current patient goals per problem list:   1.  Goal(s):  Reduce auditory and visual hallucinations to baseline.  Met:  Yes  Target date:  By Discharge   As evidenced by:  "no voices at all", but shadows at corners of eye, not bothering her  2.  Goal(s):  Reduce panic attacks/anxiety from 10 at admission to no more than 3 at discharge.  Met:  No  Target date:  By Discharge   As evidenced by:  Still at "5", but manageable  3.  Goal(s):  Deny SI for 48 hours prior to D/C.  Met:  Yes  Target date:  By Discharge   As evidenced by:  Denies adamantly  4.  Goal(s):  Reduce depression from 10 at admission  to no more than 3 at discharge.  Met:  Yes  Target date:  By Discharge   As evidenced by:  "totally gone"  5. Goal(s): Return sleep to normal pattern of 5.5-6.5 hours per night.  Met:  Target date: By Discharge  As evidenced by:  With CPAP machine, sleeping well each night  6. Goal(s): Schedule with new aftercare provider(s).  Met: No Target date: By Discharge  As evidenced by: New provider that she wanted has already discharged her in the past, so set back up with current provider.  She also plans to talk to Dr. [name] about getting a referral to a counselor.   Attendees: Patient:  Danielle [name]  08/09/2011 10:15AM-11:00AM  Family:  Katesha Eichel 08/09/2011 11:00 AM   Physician:  Dr. Harvie Heck Readling 08/09/2011 10:15AM-11:00AM  Nursing:   Tacy Learn, RN 08/09/2011 10:15AM -11:00AM   Case Manager:  Ambrose Mantle, LCSW 08/09/2011 10:15AM-11:00AM  Counselor:     Other:      Other:      Other:      Other:       Scribe for Treatment Team:   Sarina Ser, 08/09/2011, 10:15AM-11:15AM

## 2011-08-12 NOTE — Discharge Summary (Signed)
Physician Discharge Summary Note  Patient:  Danielle [name] is an 41 y.o., female MRN:  [id number] DOB:  March 21, 1971 Patient phone:  [phone] (home)  Patient address:   [address] [place] [address],   Date of Admission:  07/30/2011 Date of Discharge: 08/09/2011  Axis Diagnosis:   AXIS I:  Schizophrenia, paranoid type; Panic Disorder with agoraphobia; PTSD; MDD, recurrent.  AXIS II:  No diagnosis AXIS III:  Seizure disorder, hypothyroidism, R/O Sleep Apnea, Anemia, Headache NOS Past Medical History  Diagnosis Date  . Depressed   . Seizures   . Heart murmur   . Hypothyroidism   . Anemia   . Headache   . Anxiety    AXIS IV:  Supportive husband is an asset; recent family health issues are stressors AXIS V:  61-70 mild symptoms  Level of Care:  OP  Hospital Course: This is the second admission for Danielle [name] who presented with an exacerbation of auditory hallucinations. She said that the voices in her head tell her that she is worthless that she shouldn't be living, that she should not be on this earth. She was having thoughts of cutting her wrist. She reported a couple of recent stressors: Her 86 year old son had her first seizure at work one month ago. She feared that her son would die, and he has no insurance. He was started on medications and she's not sure how she will continue to get him treated. He is now living in the home with them.  She was admitted for acute stabilization unit and given a provisional diagnosis of schizophrenia, paranoid type. She initially complained of significant anxiety and requested an increase in her amitriptyline. Her combined amitriptyline and nortriptyline levels were found to be 354 and we did not increase her amitriptyline. In fact, over the course of her stay it was decreased to 75 mg each bedtime. Eventually we were able to also decrease her Klonopin to 1 mg three times a day. We continued her home dose of Depakote and her initial level was  found to be 36.6, after treatment by March 30, it was 63.5 current dosing regimen.  We continued her Lamictal at its current dose along with Synthroid. Risperdal was initially increased, then decreased back to 3 mg in the morning and at bedtime. Zoloft was added to address symptoms of depression and anxiety and her frequent suicidal thoughts. She responded well to medications with no apparent side effects.  She continued to complain of problems sleeping and was noted by the nurses to be snoring very loudly every night. She was given a trial on CPAP and reported that she slept much better, was much more alert during the day, and felt that her depression was resolved. We have set appointment for her with [facility] pulmonology for evaluation for sleep apnea.  By April 5 she was denying any depression, denying suicidal thoughts, no delusional thinking denying all hallucinations, and requesting to be discharged. Her husband was present for a team meeting at the time of discharge agreed she was ready to come home.   Consults:  None  Significant Diagnostic Studies:  Valproic acid level on admission 36.6, valproic acid level 63.5 on current dosage, on March 30. Combined amitriptyline, nortriptyline level 354. TSH 1.397, free T4-1.05.  Discharge Vitals:   Blood pressure 106/74, pulse 82, temperature 97.3 F (36.3 C), temperature source Oral, resp. rate 16, height 5\' 10"  (1.778 m), weight 96.616 kg (213 lb), SpO2 96.00%.  Mental Status Exam: See Mental Status  Examination and Suicide Risk Assessment completed by Attending Physician prior to discharge.  Discharge destination:  Home  Is patient on multiple antipsychotic therapies at discharge:  No   Has Patient had three or more failed trials of antipsychotic monotherapy by history:  No  Recommended Plan for Multiple Antipsychotic Therapies: N/A  Discharge Orders    Future Appointments: Provider: Department: Dept Phone: Center:   08/29/2011 10:15 AM  Barbaraann Share, MD [facility] [phone] None     Medication List  As of 08/12/2011  4:07 PM   STOP taking these medications         acetaminophen 500 MG tablet      divalproex 500 MG DR tablet         TAKE these medications      Indication    amitriptyline 75 MG tablet   Commonly known as: ELAVIL   Take 1 tablet (75 mg total) by mouth at bedtime. For depression and sleep       benztropine 1 MG tablet   Commonly known as: COGENTIN   Take 1 tablet (1 mg total) by mouth 2 (two) times daily in the am and at bedtime.. For eps.       clonazePAM 1 MG tablet   Commonly known as: KLONOPIN   Take 1 tablet (1 mg total) by mouth 3 (three) times daily at 8am, 2pm and bedtime. For anxiety.       divalproex 500 MG 24 hr tablet   Commonly known as: DEPAKOTE ER   Take 3 tablets (1,500 mg total) by mouth at bedtime. For mood stability.       haloperidol 5 MG tablet   Commonly known as: HALDOL   Take 1 tablet (5 mg total) by mouth at bedtime. For psychosis.       lamoTRIgine 200 MG tablet   Commonly known as: LAMICTAL   Take one tablet (200mg ) daily at bedtime.  For mood stability.       levothyroxine 50 MCG tablet   Commonly known as: SYNTHROID, LEVOTHROID   Take 1 tablet (50 mcg total) by mouth daily. Thyroid Supplement.       prazosin 2 MG capsule   Commonly known as: MINIPRESS   Take 1 capsule (2 mg total) by mouth at bedtime. For sleep.       risperiDONE 3 MG tablet   Commonly known as: RISPERDAL   Take 1 tablet (3 mg total) by mouth 2 (two) times daily in the am and at bedtime.. For psychosis       sertraline 50 MG tablet   Commonly known as: ZOLOFT   Take 3 tablets (150 mg total) by mouth daily with breakfast. For depression and anxiety.            Follow-up Information    Follow up with Dr. [name] on 08/12/2011. (3:00PM appointment - doctor would like to see you quickly after discharge!)    Contact information:   [address] [place], [place] [address]   Telephone:  [phone]      Follow up with Barbaraann Share, MD on 08/29/2011. (At 10:15am for evaluation of sleep apnea)    Contact information:   [address] [employer] [place] [address] [phone]          Follow-up recommendations:  Activity:  unrestricted Diet:  regular  Signed: Heinrich Fertig A 08/12/2011, 4:07 PM

## 2011-08-13 ENCOUNTER — Telehealth (HOSPITAL_COMMUNITY): Payer: Self-pay | Admitting: *Deleted

## 2011-08-13 ENCOUNTER — Emergency Department (HOSPITAL_COMMUNITY)
Admission: EM | Admit: 2011-08-13 | Discharge: 2011-08-14 | Disposition: A | Discharge status: Other Acute Inpatient Hospital | Payer: BC Managed Care – PPO | Attending: Psychiatry | Admitting: Psychiatry

## 2011-08-13 ENCOUNTER — Emergency Department (HOSPITAL_COMMUNITY): Payer: BC Managed Care – PPO

## 2011-08-13 ENCOUNTER — Encounter (HOSPITAL_COMMUNITY): Payer: Self-pay | Admitting: Emergency Medicine

## 2011-08-13 DIAGNOSIS — F17200 Nicotine dependence, unspecified, uncomplicated: Secondary | ICD-10-CM | POA: Insufficient documentation

## 2011-08-13 DIAGNOSIS — Z79899 Other long term (current) drug therapy: Secondary | ICD-10-CM | POA: Insufficient documentation

## 2011-08-13 DIAGNOSIS — F329 Major depressive disorder, single episode, unspecified: Secondary | ICD-10-CM | POA: Insufficient documentation

## 2011-08-13 DIAGNOSIS — F411 Generalized anxiety disorder: Secondary | ICD-10-CM | POA: Insufficient documentation

## 2011-08-13 DIAGNOSIS — R4182 Altered mental status, unspecified: Secondary | ICD-10-CM

## 2011-08-13 DIAGNOSIS — F3289 Other specified depressive episodes: Secondary | ICD-10-CM | POA: Insufficient documentation

## 2011-08-13 DIAGNOSIS — G40909 Epilepsy, unspecified, not intractable, without status epilepticus: Secondary | ICD-10-CM | POA: Insufficient documentation

## 2011-08-13 DIAGNOSIS — F209 Schizophrenia, unspecified: Secondary | ICD-10-CM

## 2011-08-13 DIAGNOSIS — E039 Hypothyroidism, unspecified: Secondary | ICD-10-CM | POA: Insufficient documentation

## 2011-08-13 DIAGNOSIS — N390 Urinary tract infection, site not specified: Secondary | ICD-10-CM

## 2011-08-13 DIAGNOSIS — R4789 Other speech disturbances: Secondary | ICD-10-CM | POA: Insufficient documentation

## 2011-08-13 LAB — URINALYSIS, ROUTINE W REFLEX MICROSCOPIC
Bilirubin Urine: NEGATIVE
Glucose, UA: NEGATIVE mg/dL
Nitrite: NEGATIVE
Protein, ur: NEGATIVE mg/dL
Specific Gravity, Urine: 1.025 (ref 1.005–1.030)
Urobilinogen, UA: 0.2 mg/dL (ref 0.0–1.0)
pH: 6.5 (ref 5.0–8.0)

## 2011-08-13 LAB — COMPREHENSIVE METABOLIC PANEL WITH GFR
ALT: 12 U/L (ref 0–35)
AST: 14 U/L (ref 0–37)
Alkaline Phosphatase: 66 U/L (ref 39–117)
CO2: 26 meq/L (ref 19–32)
GFR calc Af Amer: 84 mL/min — ABNORMAL LOW (ref >90)
GFR calc non Af Amer: 73 mL/min — ABNORMAL LOW (ref >90)
Glucose, Bld: 98 mg/dL (ref 70–99)
Potassium: 4 meq/L (ref 3.5–5.1)
Sodium: 139 meq/L (ref 135–145)
Total Protein: 6.8 g/dL (ref 6.0–8.3)

## 2011-08-13 LAB — RAPID URINE DRUG SCREEN, HOSP PERFORMED
Amphetamines: NOT DETECTED
Barbiturates: NOT DETECTED
Benzodiazepines: POSITIVE — AB
Cocaine: NOT DETECTED
Opiates: NOT DETECTED
Tetrahydrocannabinol: NOT DETECTED

## 2011-08-13 LAB — COMPREHENSIVE METABOLIC PANEL
Albumin: 3.3 g/dL — ABNORMAL LOW (ref 3.5–5.2)
BUN: 17 mg/dL (ref 6–23)
Calcium: 9.1 mg/dL (ref 8.4–10.5)
Chloride: 104 mEq/L (ref 96–112)
Creatinine, Ser: 0.95 mg/dL (ref 0.50–1.10)
Total Bilirubin: 0.2 mg/dL — ABNORMAL LOW (ref 0.3–1.2)

## 2011-08-13 LAB — DIFFERENTIAL
Basophils Absolute: 0 K/uL (ref 0.0–0.1)
Basophils Relative: 1 % (ref 0–1)
Eosinophils Absolute: 0.1 K/uL (ref 0.0–0.7)
Eosinophils Relative: 2 % (ref 0–5)
Lymphocytes Relative: 24 % (ref 12–46)
Lymphs Abs: 1.7 10*3/uL (ref 0.7–4.0)
Monocytes Absolute: 0.5 K/uL (ref 0.1–1.0)
Monocytes Relative: 7 % (ref 3–12)
Neutro Abs: 4.6 K/uL (ref 1.7–7.7)
Neutrophils Relative %: 66 % (ref 43–77)

## 2011-08-13 LAB — CBC
HCT: 34.5 % — ABNORMAL LOW (ref 36.0–46.0)
Hemoglobin: 11.2 g/dL — ABNORMAL LOW (ref 12.0–15.0)
MCH: 30.9 pg (ref 26.0–34.0)
MCHC: 32.5 g/dL (ref 30.0–36.0)
MCV: 95.3 fL (ref 78.0–100.0)
Platelets: 237 10*3/uL (ref 150–400)
RBC: 3.62 MIL/uL — ABNORMAL LOW (ref 3.87–5.11)
RDW: 12.9 % (ref 11.5–15.5)
WBC: 7 10*3/uL (ref 4.0–10.5)

## 2011-08-13 LAB — URINE MICROSCOPIC-ADD ON

## 2011-08-13 MED ORDER — IBUPROFEN 200 MG PO TABS: 600.0000 mg | ORAL_TABLET | Freq: Three times a day (TID) | ORAL | Status: DC | PRN

## 2011-08-13 MED ORDER — CIPROFLOXACIN HCL 500 MG PO TABS
500.0000 mg | ORAL_TABLET | Freq: Two times a day (BID) | ORAL | Status: DC
  Administered 2011-08-13: 500 mg via ORAL
  Filled 2011-08-13 (×2): qty 1

## 2011-08-13 MED ORDER — ACETAMINOPHEN 325 MG PO TABS: 650.0000 mg | ORAL_TABLET | ORAL | Status: DC | PRN

## 2011-08-13 MED ORDER — LORAZEPAM 1 MG PO TABS: 1.0000 mg | ORAL_TABLET | Freq: Three times a day (TID) | ORAL | Status: DC | PRN

## 2011-08-13 NOTE — BH Assessment (Signed)
Assessment Note   Danielle [name] is an 41 y.o. female.Seen in [facility] ED mental status. Patient was recently discharged approximately 3-4 days ago from behavioral health clinic. Patient was admitted there do to worsening, depression and hallucinations. She said that the voices in her head tell her that she is worthless that she shouldn't be living, that she should not be on this earth. She was having thoughts of cutting her wrist. She reported a couple of recent stressors: Her 18 year old son had her first seizure at work one month ago. She feared that her son would die, and he has no insurance. He was started on medications and she's not sure how she will continue to get him treated. He is now living in the home with them. . Several medications seems to have been changed. Her new medications include Cogentin, Haldol and Zoloft. Her Clonopin dosage was decreased to 1 mg, however is now taking it 3 times a day rather than twice. In addition 500 mg of Depakote was also added to her medications. A few other medications were actually discontinued according to the spouse. He reports that since she has been at home on Saturday$/10/2011, the patient has been excessively sleepy, requires complete help in order to ambulate to the bathroom and he reports that he has actually had to physically feed her in order for her to keep she seems to be "doped up" according to him. He has been managing her medications to make sure that she is taking the right dosages at the right time. No complaints of headache, fever, vomiting, diarrhea. Patient's spouse reports that the patient does smoke cigarettes, but does not drink and no history of drug abuse. The patient currently denies to me any hallucinations. The patient does have gait difficulty. She has extremely slurred speech which the spouse reports that she has a very slight history of but is markedly worse since Saturday. Of note, he notes that they discovered she likely has sleep  apnea and she was on a CPAP machine while at behavioral health, and has been referred to a power pulmonary, however the patient has not yet had her appointment and is not using any kind of ventilatory device at home currently. No prior history of asthma or COPD. Accepted for in patient [facility] hospital treatment by R Readling MD.   Axis I: Schizophrenia Axis II: Deferred Axis III:  Past Medical History  Diagnosis Date  . Depressed   . Seizures   . Heart murmur   . Hypothyroidism   . Anemia   . Headache   . Anxiety    Axis IV: other psychosocial or environmental problems and problems with primary support group Axis V: 11-20 some danger of hurting self or others possible OR occasionally fails to maintain minimal personal hygiene OR gross impairment in communication  Past Medical History:  Past Medical History  Diagnosis Date  . Depressed   . Seizures   . Heart murmur   . Hypothyroidism   . Anemia   . Headache   . Anxiety     Past Surgical History  Procedure Date  . Tubal ligation 1996    Family History: No family history on file.  Social History:  reports that she has been smoking.  She has never used smokeless tobacco. She reports that she does not drink alcohol or use illicit drugs.  Additional Social History:  Alcohol / Drug Use Pain Medications: not abusing Prescriptions: not abusing Over the Counter: nos History of alcohol /  drug use?: No history of alcohol / drug abuse Allergies:  Allergies  Allergen Reactions  . Tomato Rash    Pt reports that she breaks out in a rash if she eats tomato based foods but can eat a whole tomato  . Imitrex (Sumatriptan Base) Hives  . Mustard Seed Rash    Home Medications:  Medications Prior to Admission  Medication Dose Route Frequency Provider Last Rate Last Dose  . ciprofloxacin (CIPRO) tablet 500 mg  500 mg Oral BID Gavin Pound. Ghim, MD       Medications Prior to Admission  Medication Sig Dispense Refill  . amitriptyline  (ELAVIL) 75 MG tablet Take 1 tablet (75 mg total) by mouth at bedtime. For depression and sleep  30 tablet  0  . benztropine (COGENTIN) 1 MG tablet Take 1 tablet (1 mg total) by mouth 2 (two) times daily in the am and at bedtime.. For eps.  60 tablet  0  . clonazePAM (KLONOPIN) 1 MG tablet Take 1 tablet (1 mg total) by mouth 3 (three) times daily at 8am, 2pm and bedtime. For anxiety.  90 tablet  0  . clonazePAM (KLONOPIN) 2 MG tablet Take 1 tablet (2 mg total) by mouth at bedtime.  15 tablet  0  . divalproex (DEPAKOTE ER) 500 MG 24 hr tablet Take 3 tablets (1,500 mg total) by mouth at bedtime. For mood stability.  90 tablet  0  . haloperidol (HALDOL) 5 MG tablet Take 1 tablet (5 mg total) by mouth at bedtime. For psychosis.  30 tablet  0  . lamoTRIgine (LAMICTAL) 200 MG tablet Take one tablet (200mg ) daily at bedtime.  For mood stability.  30 tablet  0  . levothyroxine (SYNTHROID, LEVOTHROID) 50 MCG tablet Take 1 tablet (50 mcg total) by mouth daily. Thyroid Supplement.      . prazosin (MINIPRESS) 2 MG capsule Take 1 capsule (2 mg total) by mouth at bedtime. For sleep.  30 capsule  0  . risperiDONE (RISPERDAL) 3 MG tablet Take 1 tablet (3 mg total) by mouth 2 (two) times daily in the am and at bedtime.. For psychosis  60 tablet  0  . sertraline (ZOLOFT) 50 MG tablet Take 3 tablets (150 mg total) by mouth daily with breakfast. For depression and anxiety.  90 tablet  0  . DISCONTD: zolpidem (AMBIEN) 5 MG tablet Take 1 tablet (5 mg total) by mouth at bedtime as needed for sleep.  10 tablet  0    OB/GYN Status:  No LMP recorded. Patient has had a hysterectomy.  General Assessment Data Location of Assessment: [facility] Assessment Services Living Arrangements: Spouse/significant other Can pt return to current living arrangement?: Yes Admission Status: Voluntary Is patient capable of signing voluntary admission?: Yes Transfer from: Acute Hospital Referral Source: MD  Education Status Is patient  currently in school?: No Contact person: [name] Mcduffie  Risk to self Suicidal Ideation: Yes-Currently Present Suicidal Intent: Yes-Currently Present Is patient at risk for suicide?: Yes Suicidal Plan?: Yes-Currently Present Access to Means: No (currently in ED) What has been your use of drugs/alcohol within the last 12 months?: none Previous Attempts/Gestures: Yes How many times?: 3  Triggers for Past Attempts: Hallucinations Intentional Self Injurious Behavior: None Family Suicide History: Unknown Recent stressful life event(s): Other (Comment) (psychotic s/s) Persecutory voices/beliefs?: Yes Depression: Yes Depression Symptoms: Feeling worthless/self pity;Fatigue;Insomnia Substance abuse history and/or treatment for substance abuse?: No Suicide prevention information given to non-admitted patients: Not applicable  Risk to Others Homicidal Ideation:  No Thoughts of Harm to Others: No Current Homicidal Intent: No Current Homicidal Plan: No Access to Homicidal Means: No History of harm to others?: No Assessment of Violence: None Noted Does patient have access to weapons?: No Criminal Charges Pending?: No Does patient have a court date: No  Psychosis Hallucinations: Auditory;Visual  Mental Status Report Appear/Hygiene: Disheveled Eye Contact: Fair Motor Activity: Restlessness Speech: Slurred;Logical/coherent Level of Consciousness: Alert Mood: Depressed;Anxious Affect: Depressed;Anxious Anxiety Level: Minimal Thought Processes: Relevant Judgement: Impaired Orientation: Person;Place;Time;Situation Obsessive Compulsive Thoughts/Behaviors: None  Cognitive Functioning Concentration: Decreased Memory: Recent Intact;Remote Intact IQ: Average Insight: Poor Impulse Control: Poor Appetite: Fair Sleep: Decreased Total Hours of Sleep: 4  Vegetative Symptoms: None  Prior Inpatient Therapy Prior Inpatient Therapy: Yes Prior Therapy Dates: Multiple admits over the past  5-6 yrs Prior Therapy Facilty/Provider(s): [name] Reason for Treatment: psychosis  Prior Outpatient Therapy Prior Outpatient Therapy: Yes Prior Therapy Dates: 2012-present Prior Therapy Facilty/Provider(s): [facility] Reason for Treatment: psychosis  ADL Screening (condition at time of admission) Patient's cognitive ability adequate to safely complete daily activities?: Yes Patient able to express need for assistance with ADLs?: Yes Independently performs ADLs?: Yes Communication: Independent Dressing (OT): Independent Grooming: Independent Feeding: Independent Bathing: Independent Toileting: Independent In/Out Bed: Independent Walks in Home: Independent Weakness of Legs: None Weakness of Arms/Hands: None  Home Assistive Devices/Equipment Home Assistive Devices/Equipment: None    Abuse/Neglect Assessment (Assessment to be complete while patient is alone) Physical Abuse: Denies Verbal Abuse: Denies Sexual Abuse: Denies Exploitation of patient/patient's resources: Denies Self-Neglect: Denies     Merchant [employer] officer (For Healthcare) Advance Directive: Patient does not have advance directive Pre-existing out of facility DNR order (yellow form or pink MOST form): No Nutrition Screen Diet: Regular Unintentional weight loss greater than 10lbs within the last month: No Problems chewing or swallowing foods and/or liquids: No Home Tube Feeding or Total Parenteral Nutrition (TPN): No Patient appears severely malnourished: No Pregnant or Lactating: No  Additional Information 1:1 In Past 12 Months?:  (in ED) CIRT Risk: No Elopement Risk: No Does patient have medical clearance?: Yes     Disposition:  Disposition Disposition of Patient: Inpatient treatment program Type of inpatient treatment program: Adult  On Site Evaluation by:   Reviewed with Physician:     Conan Bowens 08/13/2011 5:52 PM

## 2011-08-13 NOTE — Progress Notes (Signed)
Patient Discharge Instructions:  Psychiatric Admission Assessment Note Provided,  08/13/2011 After Visit Summary (AVS) Provided,  08/13/2011 Face Sheet Provided, 08/13/2011 Faxed/Sent to the Next Level Care provider:  08/13/2011 Provided Suicide Risk Assessment - Discharge Assessment 08/13/2011  Faxed to Dr. [name] @ [phone]  Wandra Scot, 08/13/2011, 10:53 AM

## 2011-08-13 NOTE — ED Notes (Signed)
PT BROUGHT IN BY HUSBAND C/O LETHARGY UNABLE TO STAY AWAKE.PT HUSBAND STATED IT MAY BE HER NEW PSYCH MED PT D/CED FROM [facility] 08/10/11

## 2011-08-13 NOTE — ED Provider Notes (Signed)
History     CSN: [id number]  Arrival date & time 08/13/11  1150   First MD Initiated Contact with Patient 08/13/11 1357      Chief Complaint  Patient presents with  . Altered Mental Status    (Consider location/radiation/quality/duration/timing/severity/associated sxs/prior treatment) HPI Comments: Level 5 caveat due to altered mentation.  Patient was recently discharged approximately 3-4 days ago from behavioral health clinic. Patient was admitted there do to worsening schizophrenia, depression and hallucinations. Several medications seems to have been changed. Her new medications include Cogentin, Haldol and Zoloft. Her Clonopin dosage was decreased to 1 mg, however is now taking it 3 times a day rather than twice. In addition 500 mg of Depakote was also added to her medications. A few other medications were actually discontinued according to the spouse. He reports that since she has been at home on Saturday, the patient has been excessively sleepy, requires complete help in order to ambulate to the bathroom and he reports that he has actually had to physically feed her in order for her to keep she seems to be "doped up" according to him. He has been managing her medications to make sure that she is taking the right dosages at the right time. No complaints of headache, fever, vomiting, diarrhea. Patient's spouse reports that the patient does smoke cigarettes, but does not drink and no history of drug abuse. The patient currently denies to me any hallucinations. No focal numbness or weakness. The patient does have gait difficulty. She has extremely slurred speech which the spouse reports that she has a very slight history of but is markedly worse since Saturday. Of note, he notes that they discovered she likely has sleep apnea and she was on a CPAP machine while at behavioral health, and has been referred to a power pulmonary, however the patient has not yet had her appointment and is not using any  kind of ventilatory device at home currently. No prior history of asthma or COPD.  Patient is a 41 y.o. female presenting with altered mental status. The history is provided by the patient, medical records and the spouse.  Altered Mental Status    Past Medical History  Diagnosis Date  . Depressed   . Seizures   . Heart murmur   . Hypothyroidism   . Anemia   . Headache   . Anxiety     Past Surgical History  Procedure Date  . Tubal ligation 1996    History reviewed. No pertinent family history.  History  Substance Use Topics  . Smoking status: Current Everyday Smoker -- 1.0 packs/day  . Smokeless tobacco: Never Used  . Alcohol Use: No    OB History    Grav Para Term Preterm Abortions TAB SAB Ect Mult Living                  Review of Systems  Unable to perform ROS: Mental status change  Psychiatric/Behavioral: Positive for altered mental status.    Allergies  Tomato; Imitrex; and Mustard seed  Home Medications   Current Outpatient Rx  Name Route Sig Dispense Refill  . AMITRIPTYLINE HCL 75 MG PO TABS Oral Take 1 tablet (75 mg total) by mouth at bedtime. For depression and sleep 30 tablet 0  . BENZTROPINE MESYLATE 1 MG PO TABS Oral Take 1 tablet (1 mg total) by mouth 2 (two) times daily in the am and at bedtime.. For eps. 60 tablet 0  . CLONAZEPAM 1 MG PO TABS Oral  Take 1 tablet (1 mg total) by mouth 3 (three) times daily at 8am, 2pm and bedtime. For anxiety. 90 tablet 0  . DIVALPROEX SODIUM ER 500 MG PO TB24 Oral Take 3 tablets (1,500 mg total) by mouth at bedtime. For mood stability. 90 tablet 0  . HALOPERIDOL 5 MG PO TABS Oral Take 1 tablet (5 mg total) by mouth at bedtime. For psychosis. 30 tablet 0  . LAMOTRIGINE 200 MG PO TABS  Take one tablet (200mg ) daily at bedtime.  For mood stability. 30 tablet 0  . LEVOTHYROXINE SODIUM 50 MCG PO TABS Oral Take 1 tablet (50 mcg total) by mouth daily. Thyroid Supplement.    Marland Kitchen PRAZOSIN HCL 2 MG PO CAPS Oral Take 1 capsule  (2 mg total) by mouth at bedtime. For sleep. 30 capsule 0  . RISPERIDONE 3 MG PO TABS Oral Take 1 tablet (3 mg total) by mouth 2 (two) times daily in the am and at bedtime.. For psychosis 60 tablet 0  . SERTRALINE HCL 50 MG PO TABS Oral Take 3 tablets (150 mg total) by mouth daily with breakfast. For depression and anxiety. 90 tablet 0  . CLONAZEPAM 2 MG PO TABS Oral Take 1 tablet (2 mg total) by mouth at bedtime. 15 tablet 0    BP 104/70  Pulse 81  Temp(Src) 98.5 F (36.9 C) (Oral)  Resp 16  SpO2 97%  Physical Exam  Nursing note and vitals reviewed. Constitutional: She appears well-developed. No distress.  Eyes: Pupils are equal, round, and reactive to light. No scleral icterus.  Neck: Normal range of motion.  Cardiovascular: Normal rate.   Pulmonary/Chest: No respiratory distress. She has no wheezes. She has no rales.  Abdominal: Soft. She exhibits no distension. There is no tenderness.  Musculoskeletal: She exhibits no edema and no tenderness.  Neurological: She is alert. Coordination and gait abnormal.       Slurred  Speech, no facial droop.  4/4 in both UE, 4/5 in both LE's.    Skin: Skin is warm and dry. No rash noted. She is not diaphoretic.    ED Course  Procedures (including critical care time)  Labs Reviewed  COMPREHENSIVE METABOLIC PANEL - Abnormal; Notable for the following:    Albumin 3.3 (*)    Total Bilirubin 0.2 (*)    GFR calc non Af Amer 73 (*)    GFR calc Af Amer 84 (*)    All other components within normal limits  CBC - Abnormal; Notable for the following:    RBC 3.62 (*)    Hemoglobin 11.2 (*)    HCT 34.5 (*)    All other components within normal limits  URINALYSIS, ROUTINE W REFLEX MICROSCOPIC - Abnormal; Notable for the following:    APPearance CLOUDY (*)    Hgb urine dipstick MODERATE (*)    Ketones, ur TRACE (*)    Leukocytes, UA MODERATE (*)    All other components within normal limits  URINE RAPID DRUG SCREEN (HOSP PERFORMED) - Abnormal;  Notable for the following:    Benzodiazepines POSITIVE (*)    All other components within normal limits  URINE MICROSCOPIC-ADD ON - Abnormal; Notable for the following:    Squamous Epithelial / LPF FEW (*)    Bacteria, UA FEW (*)    All other components within normal limits  DIFFERENTIAL   Ct Head Wo Contrast  08/13/2011  *RADIOLOGY REPORT*  Clinical Data: Altered mental status, with history of schizophrenia.  Seizure disorder.  CT HEAD  WITHOUT CONTRAST  Technique:  Contiguous axial images were obtained from the base of the skull through the vertex without contrast.  Comparison: 01/08/2008  Findings: There appears to be mild generalized frontal and temporal atrophy with prominence of the extracerebral CSF spaces over the convexity and in the Sylvian fissures. Mild prominence of the posterior fossa CSF spaces, including those in the superior vermian cistern.  No focal areas of brain substance loss.  No acute stroke, acute hemorrhage, mass lesion, or hydrocephalus. Calvarium intact.  Clear sinuses and mastoids.  Compared with 2009, there may be slight increased prominence of the extracerebral CSF spaces. It is possible the progressive atrophy is associated chronic anticonvulsant use.  IMPRESSION: No acute stroke or bleed.  Mild generalized atrophy has increased from 2009.  Original Report Authenticated By: Elsie Stain, M.D.     1. Altered mental status   2. Schizophrenia   3. Urinary tract infection     3:20 PM I spoke to Dr. Allena Katz who thinks it is ok for pt to go back to [facility]  He agrees for a head CT here to make sure nothing organic is obvious here.  Minimal anemia here I think is not anything emergently medically worrisome.    4:41 PM Head CT shows no acute abn.  Some increased atrophy.  Pt has a UTI.  Culture added. Pt can be treated with oral cipro for 1 week.  Stable for transfer to [facility] in my opinion.    MDM  I reviewed pt's recent admission and discharge summary from [facility] from a  few days ago, discharged on 4/5.  Will recheck labs, I suspect polypharmacy is likely contributing.  No asymetric weakness or numbness.          Gavin Pound. Nathanie Ottley, MD 08/13/11 1610

## 2011-08-14 ENCOUNTER — Encounter (HOSPITAL_COMMUNITY): Payer: Self-pay | Admitting: *Deleted

## 2011-08-14 ENCOUNTER — Inpatient Hospital Stay (HOSPITAL_COMMUNITY)
Admission: AD | Admit: 2011-08-14 | Discharge: 2011-08-19 | DRG: 430 | Disposition: A | Discharge status: Home / Self Care | Payer: BC Managed Care – PPO | Source: Other Acute Inpatient Hospital | Attending: Psychiatry | Admitting: Psychiatry

## 2011-08-14 DIAGNOSIS — F332 Major depressive disorder, recurrent severe without psychotic features: Secondary | ICD-10-CM | POA: Diagnosis present

## 2011-08-14 DIAGNOSIS — F4001 Agoraphobia with panic disorder: Secondary | ICD-10-CM | POA: Diagnosis present

## 2011-08-14 DIAGNOSIS — F200 Paranoid schizophrenia: Principal | ICD-10-CM | POA: Diagnosis present

## 2011-08-14 DIAGNOSIS — F4310 Post-traumatic stress disorder, unspecified: Secondary | ICD-10-CM

## 2011-08-14 DIAGNOSIS — F339 Major depressive disorder, recurrent, unspecified: Secondary | ICD-10-CM

## 2011-08-14 DIAGNOSIS — E039 Hypothyroidism, unspecified: Secondary | ICD-10-CM | POA: Diagnosis present

## 2011-08-14 DIAGNOSIS — Z79899 Other long term (current) drug therapy: Secondary | ICD-10-CM

## 2011-08-14 LAB — URINE CULTURE
Colony Count: NO GROWTH
Culture  Setup Time: 201304100210
Culture: NO GROWTH

## 2011-08-14 LAB — VALPROIC ACID LEVEL: Valproic Acid Lvl: 105.2 ug/mL — ABNORMAL HIGH (ref 50.0–100.0)

## 2011-08-14 MED ORDER — AMITRIPTYLINE HCL 75 MG PO TABS
75.0000 mg | ORAL_TABLET | Freq: Every day | ORAL | Status: DC
  Administered 2011-08-14 (×2): 75 mg via ORAL
  Filled 2011-08-14 (×3): qty 1
  Filled 2011-08-14: qty 3

## 2011-08-14 MED ORDER — CLONAZEPAM 0.5 MG PO TABS
0.5000 mg | ORAL_TABLET | Freq: Two times a day (BID) | ORAL | Status: DC
  Administered 2011-08-14: 0.5 mg via ORAL
  Filled 2011-08-14: qty 1

## 2011-08-14 MED ORDER — MAGNESIUM HYDROXIDE 400 MG/5ML PO SUSP: 30.0000 mL | Freq: Every day | ORAL | Status: DC | PRN

## 2011-08-14 MED ORDER — CIPROFLOXACIN HCL 500 MG PO TABS
500.0000 mg | ORAL_TABLET | Freq: Two times a day (BID) | ORAL | Status: DC
  Administered 2011-08-14 – 2011-08-19 (×11): 500 mg via ORAL
  Filled 2011-08-14 (×15): qty 1

## 2011-08-14 MED ORDER — BENZTROPINE MESYLATE 1 MG PO TABS
1.0000 mg | ORAL_TABLET | Freq: Two times a day (BID) | ORAL | Status: DC
  Administered 2011-08-14 – 2011-08-15 (×3): 1 mg via ORAL
  Filled 2011-08-14 (×5): qty 1

## 2011-08-14 MED ORDER — DIVALPROEX SODIUM ER 500 MG PO TB24
1500.0000 mg | ORAL_TABLET | Freq: Every day | ORAL | Status: DC
  Administered 2011-08-14 – 2011-08-15 (×3): 1500 mg via ORAL
  Filled 2011-08-14 (×6): qty 3

## 2011-08-14 MED ORDER — SERTRALINE HCL 50 MG PO TABS
150.0000 mg | ORAL_TABLET | Freq: Every day | ORAL | Status: DC
  Administered 2011-08-14 – 2011-08-19 (×6): 150 mg via ORAL
  Filled 2011-08-14: qty 2
  Filled 2011-08-14 (×8): qty 1

## 2011-08-14 MED ORDER — LAMOTRIGINE 200 MG PO TABS
200.0000 mg | ORAL_TABLET | Freq: Every day | ORAL | Status: DC
  Administered 2011-08-14 (×2): 200 mg via ORAL
  Filled 2011-08-14: qty 1
  Filled 2011-08-14: qty 2
  Filled 2011-08-14 (×2): qty 1

## 2011-08-14 MED ORDER — LEVOTHYROXINE SODIUM 50 MCG PO TABS
50.0000 ug | ORAL_TABLET | Freq: Every day | ORAL | Status: DC
  Administered 2011-08-14 – 2011-08-19 (×6): 50 ug via ORAL
  Filled 2011-08-14 (×10): qty 1

## 2011-08-14 MED ORDER — ALUM & MAG HYDROXIDE-SIMETH 200-200-20 MG/5ML PO SUSP: 30.0000 mL | ORAL | Status: DC | PRN

## 2011-08-14 MED ORDER — RISPERIDONE 3 MG PO TABS
3.0000 mg | ORAL_TABLET | Freq: Two times a day (BID) | ORAL | Status: DC
  Administered 2011-08-14 – 2011-08-15 (×3): 3 mg via ORAL
  Filled 2011-08-14 (×5): qty 1

## 2011-08-14 MED ORDER — ACETAMINOPHEN 325 MG PO TABS: 650.0000 mg | ORAL_TABLET | Freq: Four times a day (QID) | ORAL | Status: DC | PRN

## 2011-08-14 MED ORDER — CLONAZEPAM 1 MG PO TABS: 1.0000 mg | ORAL_TABLET | Freq: Two times a day (BID) | ORAL | Status: DC

## 2011-08-14 NOTE — Progress Notes (Addendum)
Patient ID: Danielle [name], female   DOB: [birth year], 41 y.o.   MRN: [id number] Pt. Is 41 y.o. Female admitted requesting medication adjustment. Pt. Was recently discharged from [facility] on 08/10/11. Pt reports "when I got home I couldn't do anything, I was running into walls, needed help eating, and putting on my clothes.  Pt. Reports she was taking 100 mg of Amitriptyline on admission but went home on 25 mg. She also says that Zoloft and Lamictal was new to her regime. Pt. Also reports that she was taking soma, Clinical research associate does not see that med in discharge regime. Writer reported this to pt. And she reports "I got it from the pharmacy." Pt. Also reports AVH, says see things out of the corner of my eyes, but did not elaborate on the what the voices say. During admission pt. Is lethargic and slow of speech, slurred. She denies HI, but does reports SI without a plan. She contracts for safety. Pt. Has a hx of seizures(most recent 2 days ago), anemia, hypothyroidism, migraines.  She also reports that she used a CPAP on last admission, although she does not use one at home "I was suppose to see doctor for this on next week. Pt. Was also given Cipro in ED for bacteria noted in UA. Pt. Lives with husband who will be bring her clothing tomorrow. Pt. Offered food/drinks and oriented to hall/room. Pt. Verbalized understanding. Staff will monitor q32min for safety.

## 2011-08-14 NOTE — H&P (Signed)
Psychiatric Admission Assessment Adult  Patient Identification:  Danielle [name]  Date of Evaluation:  08/14/2011  Chief Complaint:  Bipolar Disorder  History of Present Illness: This is the 3rd psychiatric admission in this [facility] for this 41 year old Caucasian female. She presents today with depressed and flat affect. She is sitting up in a wheel chair. She appeared sedated, however, easily aroused. Her speech is somewhat slurred. Patient states, "I am here because I am depressed and suicidal. I can't dress my self. My husband was helping me dress up, bath and feed. I was running into things. I am not well. My medication is not right. I think my medicines need to be adjusted. That is why I am so depressed. The voices are still there. I hear them"    See below for most recent admission assessment done on 07/31/11 when patient was able to provide necessary information.   This is the second admission for Danielle [name] who presented with an exacerbation for auditory hallucinations. She said that the voices in her head tell her that she is worthless that she shouldn't be living, that she should not be on this earth. She was having thoughts of cutting her wrist. She recalled reports a couple of recent stressors: Her 69 year old son had her first seizure at work one month ago. She feared that her son would die, and he has no insurance. He was started on medications and she's not sure how she will continue to get him treated. He is now living in the home with them.  2 weeks ago she moved into the home, her best friend, his wife, and there young son. She reports this is a good stressor that they have keep the house organized. She denies lapsing medications, since her husband keeps them organized for her.She reports her marriage is good and husband is supportive.  Today she endorses depression which he rates it as 9/10 on a 1-10 scale if 10 is the worst symptoms. Suicidal thoughts are 8-9/10 in intensity; anxiety is an  8-9/10, hopelessness an 8/10.  She feels she can be safe on the unit at this time and will tell us if the voices get worse. Feels she cannot tolerate group therapy today, feels anxious around other people. Isolating in room. She is admitted for acute stabilization unit.  Past psychiatric history:  One previous admission at behavioral health one year ago. Also previous admissions at old [facility] and [facility]. Previous medication trials include Geodon, and Seroquel, which caused significant weight gain. No previous trials of Haldol, Trilafon, Abilify, and Zyprexa. She has a history of previous suicide attempts by cutting her wrist. She has a history of learning disabilities posttraumatic stress disorder and bipolar disorder. Also reports a history of panic disorder with agoraphobia. She is currently followed as an outpatient by Dr. [name] at [facility] in [place].     Mood Symptoms:  Anhedonia, Hopelessness, Sadness, SI,  Depression Symptoms:  depressed mood,  (Hypo) Manic Symptoms:  Distractibility,  Anxiety Symptoms:  Panic Symptoms,  Psychotic Symptoms:  Hallucinations: Auditory  PTSD Symptoms: Had a traumatic exposure:  Patient is not able to provide this information.  Past Psychiatric History: Diagnosis: Schizophrenia, paranoia type  Hospitalizations: [facility]  Outpatient Care: Patient is unable to provide this information.  Substance Abuse Care: Patient is unable to provide this information.  Self-Mutilation: Patient is unable to provide this information.  Suicidal Attempts: Patient is unable to provide this information  Violent Behaviors: Patient is unable to  provide this information.   Past Medical History:   Past Medical History  Diagnosis Date  . Depressed   . Seizures   . Heart murmur   . Hypothyroidism   . Anemia   . Headache   . Anxiety     Allergies:   Allergies  Allergen Reactions  . Tomato Rash    Pt reports that she breaks out in a rash if she  eats tomato based foods but can eat a whole tomato  . Imitrex (Sumatriptan Base) Hives  . Mustard Seed Rash   PTA Medications: Prescriptions prior to admission  Medication Sig Dispense Refill  . amitriptyline (ELAVIL) 75 MG tablet Take 1 tablet (75 mg total) by mouth at bedtime. For depression and sleep  30 tablet  0  . benztropine (COGENTIN) 1 MG tablet Take 1 tablet (1 mg total) by mouth 2 (two) times daily in the am and at bedtime.. For eps.  60 tablet  0  . clonazePAM (KLONOPIN) 1 MG tablet Take 1 tablet (1 mg total) by mouth 3 (three) times daily at 8am, 2pm and bedtime. For anxiety.  90 tablet  0  . divalproex (DEPAKOTE ER) 500 MG 24 hr tablet Take 3 tablets (1,500 mg total) by mouth at bedtime. For mood stability.  90 tablet  0  . haloperidol (HALDOL) 5 MG tablet Take 1 tablet (5 mg total) by mouth at bedtime. For psychosis.  30 tablet  0  . lamoTRIgine (LAMICTAL) 200 MG tablet Take one tablet (200mg ) daily at bedtime.  For mood stability.  30 tablet  0  . levothyroxine (SYNTHROID, LEVOTHROID) 50 MCG tablet Take 1 tablet (50 mcg total) by mouth daily. Thyroid Supplement.      . prazosin (MINIPRESS) 2 MG capsule Take 1 capsule (2 mg total) by mouth at bedtime. For sleep.  30 capsule  0  . risperiDONE (RISPERDAL) 3 MG tablet Take 1 tablet (3 mg total) by mouth 2 (two) times daily in the am and at bedtime.. For psychosis  60 tablet  0  . sertraline (ZOLOFT) 50 MG tablet Take 3 tablets (150 mg total) by mouth daily with breakfast. For depression and anxiety.  90 tablet  0  . clonazePAM (KLONOPIN) 2 MG tablet Take 1 tablet (2 mg total) by mouth at bedtime.  15 tablet  0    Previous Psychotropic Medications:  Medication/Dose  Elavil 25 mg               Substance Abuse History in the last 12 months: Substance Age of 1st Use Last Use Amount Specific Type  Nicotine      Alcohol Patient is unable to provide any useful information on this section of the assessment.     Cannabis        Opiates      Cocaine      Methamphetamines      LSD      Ecstasy      Benzodiazepines      Caffeine      Inhalants      Others:                         Consequences of Substance Abuse: Medical Consequences:  Liver damage Legal Consequences:  Arrests, jail time Family Consequences:  Family discord  Social History: Current Place of Residence:  Systems analyst of Birth:  [place]  Family Members: "My husband"  Marital Status:  Married  Children:  Sons:  Daughters:  Relationships: "My husband"  Education:  None reported  Educational Problems/Performance: None reported  Religious Beliefs/Practices: None reported  History of Abuse (Emotional/Phsycial/Sexual): None reported  Occupational Experiences: Unemployed  Military History:  None.  Legal History: None reported  Hobbies/Interests: None reported  Family History:  No family history on file.  Mental Status Examination/Evaluation: Objective:  Appearance: Lethargic, but easily aroused  Eye Contact::  Fair  Speech:  Slurred  Volume:  Decreased  Mood:  Depressed  Affect:  Depressed and Flat  Thought Process:  Illogical  Orientation:  Other:  Oriented to person and place  Thought Content:  Rumination  Suicidal Thoughts:  Yes.  without intent/plan  Homicidal Thoughts:  No  Memory:  Immediate;   Fair Recent;   Fair Remote;   Poor  Judgement:  Impaired  Insight:  Lacking  Psychomotor Activity:  Decreased  Concentration:  Poor  Recall:  Poor  Akathisia:  No  Handed:  Right  AIMS (if indicated):     Assets:  Desire for Improvement  Sleep:  Number of Hours: 2     Laboratory/X-Ray: None Psychological Evaluation(s)      Assessment:    AXIS I:  Chronic Paranoid Schizophrenia AXIS II:  Deferred AXIS III:   Past Medical History  Diagnosis Date  . Depressed   . Seizures   . Heart murmur   . Hypothyroidism   . Anemia   . Headache   . Anxiety    AXIS IV:  other psychosocial or  environmental problems AXIS V:  21-30 behavior considerably influenced by delusions or hallucinations OR serious impairment in judgment, communication OR inability to function in almost all areas  Treatment Plan/Recommendations: Admit for safety and stabilization.                       Review and reinstate any pertinent home medications for other health problems.  Treatment Plan Summary: Daily contact with patient to assess and evaluate symptoms and progress in treatment Medication management  Current Medications:  Current Facility-Administered Medications  Medication Dose Route Frequency Provider Last Rate Last Dose  . acetaminophen (TYLENOL) tablet 650 mg  650 mg Oral Q6H PRN Viviann Spare, NP      . alum & mag hydroxide-simeth (MAALOX/MYLANTA) 200-200-20 MG/5ML suspension 30 mL  30 mL Oral Q4H PRN Viviann Spare, NP      . amitriptyline (ELAVIL) tablet 75 mg  75 mg Oral QHS Viviann Spare, NP   75 mg at 08/14/11 0426  . benztropine (COGENTIN) tablet 1 mg  1 mg Oral BID Viviann Spare, NP   1 mg at 08/14/11 0845  . ciprofloxacin (CIPRO) tablet 500 mg  500 mg Oral BID Viviann Spare, NP   500 mg at 08/14/11 0845  . clonazePAM (KLONOPIN) tablet 0.5 mg  0.5 mg Oral BID Viviann Spare, NP   0.5 mg at 08/14/11 0845  . divalproex (DEPAKOTE ER) 24 hr tablet 1,500 mg  1,500 mg Oral QHS Viviann Spare, NP   1,500 mg at 08/14/11 0425  . lamoTRIgine (LAMICTAL) tablet 200 mg  200 mg Oral QHS Viviann Spare, NP   200 mg at 08/14/11 0426  . levothyroxine (SYNTHROID, LEVOTHROID) tablet 50 mcg  50 mcg Oral QAC breakfast Viviann Spare, NP   50 mcg at 08/14/11 0645  . magnesium hydroxide (MILK OF MAGNESIA) suspension 30 mL  30 mL Oral Daily PRN Viviann Spare, NP      .  risperiDONE (RISPERDAL) tablet 3 mg  3 mg Oral BID Viviann Spare, NP   3 mg at 08/14/11 0845  . sertraline (ZOLOFT) tablet 150 mg  150 mg Oral Daily Viviann Spare, NP   150 mg at 08/14/11 0846  . DISCONTD:  clonazePAM (KLONOPIN) tablet 1 mg  1 mg Oral BID Viviann Spare, NP       Facility-Administered Medications Ordered in Other Encounters  Medication Dose Route Frequency Provider Last Rate Last Dose  . DISCONTD: acetaminophen (TYLENOL) tablet 650 mg  650 mg Oral Q4H PRN Cheri Guppy, MD      . DISCONTD: ciprofloxacin (CIPRO) tablet 500 mg  500 mg Oral BID Gavin Pound. Ghim, MD   500 mg at 08/13/11 1953  . DISCONTD: ibuprofen (ADVIL,MOTRIN) tablet 600 mg  600 mg Oral Q8H PRN Cheri Guppy, MD      . DISCONTD: LORazepam (ATIVAN) tablet 1 mg  1 mg Oral Q8H PRN Cheri Guppy, MD        Observation Level/Precautions:  Q 15 minutes checks for safety  Laboratory:  Reviewed Lab report findings on file.  Psychotherapy:  Group counseling and activities  Medications:  See medication lists  Routine PRN Medications:  Yes  Consultations:  None indicated at this time  Discharge Concerns:  Safety   Other:     Armandina Stammer I 4/10/20135:47 PM

## 2011-08-14 NOTE — Progress Notes (Signed)
[facility] Group Notes: (Counselor/Nursing/MHT/Case Management/Adjunct) 08/14/2011   @1 :15pm  Type of Therapy:  Group Therapy  Participation Level:  None  Participation Quality:   Inattentive  Affect:  Bizarre  Cognitive:  Possibly hallucinating  Insight: None    Engagement in Group: None  Engagement in Therapy:  None  Modes of Intervention:  Support and Exploration  Summary of Progress/Problems:  Herta came to group late. She was not very engaged, and only spoke up once, to ask about having visitors during group time (which was very off -topic).  Billie Lade 08/14/2011 2:42 PM

## 2011-08-14 NOTE — ED Notes (Signed)
Called security and informed pt ready to transport to [facility]. Pt states her husband has her belongings.

## 2011-08-14 NOTE — Progress Notes (Signed)
Patient ID: Danielle [name], female   DOB: 02-20-1971, 41 y.o.   MRN: [id number] Pt is awake on the unit this AM. Pt mood is depressed and isolative and affect is anxious. Pt endorses passive SI but she is able to contract for safety. Pt wants to return to bed after med pass but writer instructed pt to attend groups per treatment agreement that she signed. Pt reluctantly agreed. Writer will continue to monitor.

## 2011-08-14 NOTE — Progress Notes (Signed)
Home meds ordered with klonopin decreased from 1mg  to 0.5 mg for 0800 and 1600 only. Per Dr orders pt was started on her night meds of Lamictal 200mg  , Depakote 1500 ER, and Elavil 75mg . A Cpap was also applied to this pt.

## 2011-08-14 NOTE — [facility] Suicide Risk Assessment (Signed)
Suicide Risk Assessment  Admission Assessment     Demographic factors:  Assessment Details Time of Assessment: Admission Information Obtained From: Patient Current Mental Status:  Current Mental Status: Self-harm thoughts Loss Factors:  Loss Factors: Financial problems / change in socioeconomic status Historical Factors:  Historical Factors: Prior suicide attempts Risk Reduction Factors:  Risk Reduction Factors: Responsible for children under 56 years of age;Sense of responsibility to family;Living with another person, especially a relative  CLINICAL FACTORS:   Panic Attacks Alcohol/Substance Abuse/Dependencies Schizophrenia:   Epilepsy Previous Psychiatric Diagnoses and Treatments Medical Diagnoses and Treatments/Surgeries  COGNITIVE FEATURES THAT CONTRIBUTE TO RISK:  Closed-mindedness Thought constriction (tunnel vision)    SUICIDE RISK:   Moderate:  Frequent suicidal ideation with limited intensity, and duration, some specificity in terms of plans, no associated intent, good self-control, limited dysphoria/symptomatology, some risk factors present, and identifiable protective factors, including available and accessible social support.  Reason for hospitalization: .Family unable to provide round the clock nursing care to this able bodied person who has been choosing to only take her Elavil and Klonopin and has become oversedated and slurred in her speech.  Diagnosis:  Axis I: Benzodiazepine Abuse with acute intoxication, Schizophrenia, paranoid type; Panic Disorder with agoraphobia; PTSD; MDD, recurrent.  AXIS II: No diagnosis  AXIS III: Seizure disorder, hypothyroidism, R/O Sleep Apnea, Anemia, Headache NOS   ADL's:  Impaired  Sleep: Good  Appetite:  Poor  Suicidal Ideation:  Pt unable to interview because she is sleeping the whole afternoon.  When she had been awake other staff reported that she had slurred (dysarthric typical for her) speech Homicidal Ideation:    Unable to assess, see above  Mental Status Examination/Evaluation: Objective:  Appearance: Disheveled  Eye Contact::  None  Speech:  Slurred and dysarthric as if she had extremely poor hearing when she learned to speak when she spoke with other staff  Volume:  Increased  Mood:  Euthymic  Affect:  Congruent  Thought Process:  unable to access  Orientation:  unable to access  Thought Content:  unable to access  Suicidal Thoughts:  unable to access  Homicidal Thoughts:  unable to access  Memory:  unable to access  Judgement:  unable to access  Insight:  unable to access  Psychomotor Activity:  Decreased  Concentration:  unable to access  Recall:  unable to access  Akathisia:  No  AIMS (if indicated):     Assets:  Financial Resources/Insurance Housing Intimacy  Sleep:  Number of Hours: 2     Vital Signs: Blood pressure 108/83, pulse 73, temperature 96.6 F (35.9 C), temperature source Oral, resp. rate 16, height 5\' 9"  (1.753 m), weight 96.163 kg (212 lb), SpO2 93.00%. Current Medications:  Current Facility-Administered Medications  Medication Dose Route Frequency Provider Last Rate Last Dose  . acetaminophen (TYLENOL) tablet 650 mg  650 mg Oral Q6H PRN Viviann Spare, NP      . alum & mag hydroxide-simeth (MAALOX/MYLANTA) 200-200-20 MG/5ML suspension 30 mL  30 mL Oral Q4H PRN Viviann Spare, NP      . amitriptyline (ELAVIL) tablet 75 mg  75 mg Oral QHS Viviann Spare, NP   75 mg at 08/14/11 0426  . benztropine (COGENTIN) tablet 1 mg  1 mg Oral BID Viviann Spare, NP   1 mg at 08/14/11 0845  . ciprofloxacin (CIPRO) tablet 500 mg  500 mg Oral BID Viviann Spare, NP   500 mg at 08/14/11 0845  . clonazePAM (KLONOPIN) tablet  0.5 mg  0.5 mg Oral BID Viviann Spare, NP   0.5 mg at 08/14/11 0845  . divalproex (DEPAKOTE ER) 24 hr tablet 1,500 mg  1,500 mg Oral QHS Viviann Spare, NP   1,500 mg at 08/14/11 0425  . lamoTRIgine (LAMICTAL) tablet 200 mg  200 mg Oral QHS Viviann Spare, NP   200 mg at 08/14/11 0426  . levothyroxine (SYNTHROID, LEVOTHROID) tablet 50 mcg  50 mcg Oral QAC breakfast Viviann Spare, NP   50 mcg at 08/14/11 0645  . magnesium hydroxide (MILK OF MAGNESIA) suspension 30 mL  30 mL Oral Daily PRN Viviann Spare, NP      . risperiDONE (RISPERDAL) tablet 3 mg  3 mg Oral BID Viviann Spare, NP   3 mg at 08/14/11 0845  . sertraline (ZOLOFT) tablet 150 mg  150 mg Oral Daily Viviann Spare, NP   150 mg at 08/14/11 0846  . DISCONTD: clonazePAM (KLONOPIN) tablet 1 mg  1 mg Oral BID Viviann Spare, NP       Facility-Administered Medications Ordered in Other Encounters  Medication Dose Route Frequency Provider Last Rate Last Dose  . DISCONTD: acetaminophen (TYLENOL) tablet 650 mg  650 mg Oral Q4H PRN Cheri Guppy, MD      . DISCONTD: ciprofloxacin (CIPRO) tablet 500 mg  500 mg Oral BID Gavin Pound. Ghim, MD   500 mg at 08/13/11 1953  . DISCONTD: ibuprofen (ADVIL,MOTRIN) tablet 600 mg  600 mg Oral Q8H PRN Cheri Guppy, MD      . DISCONTD: LORazepam (ATIVAN) tablet 1 mg  1 mg Oral Q8H PRN Cheri Guppy, MD        Lab Results:  Results for orders placed during the hospital encounter of 08/14/11 (from the past 48 hour(s))  VALPROIC ACID LEVEL     Status: Abnormal   Collection Time   08/14/11  6:30 AM      Component Value Range Comment   Valproic Acid Lvl 105.2 (*) 50.0 - 100.0 (ug/mL)    Physical Findings: AIMS:   CIWA:     COWS:      Treatment Plan Summary: Daily contact with patient to assess and evaluate symptoms and progress in treatment Medication management  Risk of harm to self is elevated by her diagnoses and her addictions  Risk of harm to others is elevated by her diagnosis  Plan: We will admit the patient for crisis stabilization and treatment. Will stop the Klonopin that she has become so fond of and use daytime Risperdal for the management of her anxiety. We will continue on q. 15 checks the unit protocol. At  this time there is no clinical indication for one-to-one observation as patient contract for safety and presents little risk to harm themself and others.  We will increase collateral information. I encourage patient to participate in group milieu therapy. Pt will be seen in treatment team meeting tomorrow morning for further treatment and appropriate discharge planning. Please see history and physical note for more detailed information ELOS: 3 to 5 days.   Jobe Mutch 08/14/2011, 6:19 PM

## 2011-08-14 NOTE — ED Notes (Signed)
Pt out of ED with security and tech to transport to [facility].

## 2011-08-14 NOTE — Progress Notes (Signed)
Patient seen during d/c planning group and individually.  Patient speech slurred and difficult to understand.  She advised of admitted to get on the correct medications.  She reports SI off/on and A/H.  Patient able to contract for safety.

## 2011-08-14 NOTE — Tx Team (Signed)
Initial Interdisciplinary Treatment Plan  PATIENT STRENGTHS: (choose at least two) General fund of knowledge Motivation for treatment/growth Supportive family/friends  PATIENT STRESSORS: Financial difficulties Health problems Medication change or noncompliance   PROBLEM LIST: Problem List/Patient Goals Date to be addressed Date deferred Reason deferred Estimated date of resolution  Medication adjustment 08-14-11     Depression 08-14-11     SI 08-14-11     Hx. Of seizures, anemia, hypothyroidism, and migraines 08-14-11                                    DISCHARGE CRITERIA:  Adequate post-discharge living arrangements Improved stabilization in mood, thinking, and/or behavior Medical problems require only outpatient monitoring  PRELIMINARY DISCHARGE PLAN: Attend aftercare/continuing care group Participate in family therapy  PATIENT/FAMIILY INVOLVEMENT: This treatment plan has been presented to and reviewed with the patient, Danielle [name], and/or family member.  The patient and family have been given the opportunity to ask questions and make suggestions.  Mickeal Needy 08/14/2011, 2:40 AM

## 2011-08-14 NOTE — Tx Team (Signed)
Interdisciplinary Treatment Plan Update (Adult)  Date:  08/14/2011  Time Reviewed:  9:33 AM   Progress in Treatment: Attending groups:   Yes   Participating in groups:  Yes Taking medication as prescribed:  Yes Tolerating medication:  Yes Family/Significant othe contact made:  [place] will allow contact with her husband Patient understands diagnosis:  Yes Discussing patient identified problems/goals with staff: Yes Medical problems stabilized or resolved: Yes Denies suicidal/homicidal ideation: No but contracts for safety Issues/concerns per patient self-inventory:  Other:  New problem(s) identified:  Reason for Continuation of Hospitalization: Anxiety Depression Medication stabilization Suicidal ideation  Interventions implemented related to continuation of hospitalization:  Medication Management; safety checks q 15 mins  Additional comments:  Estimated length of stay: 4-5 days  Discharge Plan: Home with family and outpatient follow up  New goal(s):  Review of initial/current patient goals per problem list:    1.  Goal(s):  Eliminate SI/thoughts of self harm  Met:  No  Target date: d/c  As evidenced by: Cinda will no longer endorse SI or other thoughts of self harm  2.  Goal (s):  Eliminate/Reduce psychosis (A/H)  Met:  No  Target date: d/c  As evidenced by:  Patient will advise of no longer have A/H or occurrences are less frequent  3.  Goal(s): Reduce feelings of hopelessness/helplessness (Currently rated at eight and ten)  Met:  No  Target date:  d/c  As evidenced by: Patient will rate symptoms at four or below on discharge  4.  Goal(s):  Stabilize on medications  Met:  No  Target date: d/c  As evidenced by:  Patient will report medications are working - symptoms improved  Attendees: Patient:     Family:     Physician:  Orson Aloe, MD 08/14/2011 9:33 AM   Nursing:   Cato Mulligan, RN 08/14/2011 9:33 AM   CaseManager:  Juline Patch, LCSW  08/14/2011 9:33 AM   Counselor:  Angus Palms, LCSW 08/14/2011 9:33 AM   Other:   08/14/2011 9:33 AM   Other:  Reyes Ivan, LCSWA 08/14/2011  9:33 AM   Other:     Other:      Scribe for Treatment Team:   Wynn Banker, LCSW,  08/14/2011 9:33 AM

## 2011-08-15 DIAGNOSIS — F200 Paranoid schizophrenia: Principal | ICD-10-CM

## 2011-08-15 DIAGNOSIS — F332 Major depressive disorder, recurrent severe without psychotic features: Secondary | ICD-10-CM

## 2011-08-15 DIAGNOSIS — F4001 Agoraphobia with panic disorder: Secondary | ICD-10-CM

## 2011-08-15 DIAGNOSIS — F4310 Post-traumatic stress disorder, unspecified: Secondary | ICD-10-CM

## 2011-08-15 MED ORDER — NICOTINE 21 MG/24HR TD PT24
21.0000 mg | MEDICATED_PATCH | Freq: Every day | TRANSDERMAL | Status: DC
  Administered 2011-08-15 – 2011-08-19 (×5): 21 mg via TRANSDERMAL
  Filled 2011-08-15 (×7): qty 1

## 2011-08-15 MED ORDER — RISPERIDONE 2 MG PO TABS
2.0000 mg | ORAL_TABLET | ORAL | Status: DC
  Administered 2011-08-15 – 2011-08-19 (×8): 2 mg via ORAL
  Filled 2011-08-15 (×12): qty 1

## 2011-08-15 MED ORDER — BENZTROPINE MESYLATE 1 MG PO TABS
1.0000 mg | ORAL_TABLET | ORAL | Status: DC
  Administered 2011-08-15 – 2011-08-19 (×8): 1 mg via ORAL
  Filled 2011-08-15 (×10): qty 1

## 2011-08-15 MED ORDER — AMITRIPTYLINE HCL 50 MG PO TABS
50.0000 mg | ORAL_TABLET | Freq: Every day | ORAL | Status: DC
  Administered 2011-08-15 – 2011-08-18 (×4): 50 mg via ORAL
  Filled 2011-08-15 (×5): qty 1

## 2011-08-15 MED ORDER — LAMOTRIGINE 100 MG PO TABS
150.0000 mg | ORAL_TABLET | Freq: Every day | ORAL | Status: DC
  Administered 2011-08-15 – 2011-08-18 (×4): 150 mg via ORAL
  Filled 2011-08-15 (×5): qty 1.5

## 2011-08-15 NOTE — Progress Notes (Signed)
Pt is pleasant and cooperative. Pt at times can be needy and act child like. Pt did attend evening group. Pt is sleeping at this time. Pt was offered support and encouragement. Pt is receptive to treatment and safety maintained on the unit.

## 2011-08-15 NOTE — Progress Notes (Signed)
Pt attended discharge planning group and actively participated.  Pt presents with flat affect and mood.  Pt ranks depression and anxiety at a 2 today.  Pt denies SI.  Pt states that she came to the hospital 2 days after leaving [facility] because she was unable to care for herself at home.  Pt states that she lives with her husband and 2 children, ages 42 and 85 in [place].  Pt states that she goes to Dr [name] at [facility] in [facility] for medication mangement. SW will confirm pt's follow up.  No further needs voiced by pt at this time.    Reyes Ivan, LCSWA 08/15/2011  9:54 AM

## 2011-08-15 NOTE — Progress Notes (Signed)
Pt observed resting in bed with eyes closed. CPAP in place. RR WNL, even and unlabored. No distress observed or voiced. Level III obs in place. Pt remains safe. Lawrence Marseilles

## 2011-08-15 NOTE — Progress Notes (Signed)
[facility] MD Progress Note  08/15/2011 3:03 PM  Diagnosis:  Axis I: Schizophrenia - Paranoid Type.  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  Posttraumatic Stress Disorder.  Panic Disorder with Agoraphobia.   The patient was seen today and reports the following:   ADL's: Intact.  Sleep: The patient reports to sleeping well last night now that her C-PAP has been restarted. Appetite: The patient reports a good appetite today.   Mild>(1-10) >Severe  Hopelessness (1-10): 0  Depression (1-10): 2  Anxiety (1-10): 3-4   Suicidal Ideation: The patient adamantly denies any suicidal ideations today.  Plan: No  Intent: No  Means: No   Homicidal Ideation: The patient adamantly denies any homicidal ideations today.  Plan: No  Intent: No.  Means: No   General Appearance/Behavior: Appropriate and cooperative today but again appears oversedated.  Eye Contact: Good.  Speech: Appropriate in rate and volume today but with some slurring of speech noted.  Motor Behavior: wnl.  Level of Consciousness: Somnolent but Oriented x 3.  Mental Status: Somnolent but Oriented x 3.  Mood: Mildly depressed.  Affect: Moderately constricted.  Anxiety Level: Mild to moderate anxiety reported.  Thought Process: wnl.  Thought Content: The patient denies any auditory or visual hallucinations or delusional thinking.  Perception:. wnl.  Judgment: Fair.  Insight: Fair.  Cognition: Oriented to person, place and time.  Sleep:  Number of Hours: 6.75    Vital Signs:Blood pressure 114/82, pulse 75, temperature 97.2 F (36.2 C), temperature source Oral, resp. rate 16, height 5\' 9"  (1.753 m), weight 96.163 kg (212 lb), SpO2 95.00%.  Current Medications: Current Facility-Administered Medications  Medication Dose Route Frequency Provider Last Rate Last Dose  . acetaminophen (TYLENOL) tablet 650 mg  650 mg Oral Q6H PRN Viviann Spare, NP      . alum & mag hydroxide-simeth (MAALOX/MYLANTA) 200-200-20 MG/5ML  suspension 30 mL  30 mL Oral Q4H PRN Viviann Spare, NP      . amitriptyline (ELAVIL) tablet 50 mg  50 mg Oral QHS Curlene Labrum Cora Stetson, MD      . benztropine (COGENTIN) tablet 1 mg  1 mg Oral BH-qamhs Deyanna Mctier D Annika Selke, MD      . ciprofloxacin (CIPRO) tablet 500 mg  500 mg Oral BID Curlene Labrum Maleia Weems, MD   500 mg at 08/15/11 1610  . divalproex (DEPAKOTE ER) 24 hr tablet 1,500 mg  1,500 mg Oral QHS Curlene Labrum Kiani Wurtzel, MD   1,500 mg at 08/14/11 2118  . lamoTRIgine (LAMICTAL) tablet 150 mg  150 mg Oral QHS Harm Jou D Joseantonio Dittmar, MD      . levothyroxine (SYNTHROID, LEVOTHROID) tablet 50 mcg  50 mcg Oral QAC breakfast Ronny Bacon, MD   50 mcg at 08/15/11 [phone]  . magnesium hydroxide (MILK OF MAGNESIA) suspension 30 mL  30 mL Oral Daily PRN Viviann Spare, NP      . nicotine (NICODERM CQ - dosed in mg/24 hours) patch 21 mg  21 mg Transdermal Daily Curlene Labrum Zohair Epp, MD   21 mg at 08/15/11 1059  . risperiDONE (RISPERDAL) tablet 2 mg  2 mg Oral BH-qamhs Harlene Petralia D Albany Winslow, MD      . sertraline (ZOLOFT) tablet 150 mg  150 mg Oral Daily Curlene Labrum Leisa Gault, MD   150 mg at 08/15/11 0833  . DISCONTD: amitriptyline (ELAVIL) tablet 75 mg  75 mg Oral QHS Viviann Spare, NP   75 mg at 08/14/11 2119  . DISCONTD: benztropine (COGENTIN) tablet 1 mg  1 mg Oral BID Viviann Spare, NP   1 mg at 08/15/11 1478  . DISCONTD: clonazePAM (KLONOPIN) tablet 0.5 mg  0.5 mg Oral BID Viviann Spare, NP   0.5 mg at 08/14/11 0845  . DISCONTD: lamoTRIgine (LAMICTAL) tablet 200 mg  200 mg Oral QHS Viviann Spare, NP   200 mg at 08/14/11 2118  . DISCONTD: risperiDONE (RISPERDAL) tablet 3 mg  3 mg Oral BID Viviann Spare, NP   3 mg at 08/15/11 2956   Lab Results:  Results for orders placed during the hospital encounter of 08/14/11 (from the past 48 hour(s))  VALPROIC ACID LEVEL     Status: Abnormal   Collection Time   08/14/11  6:30 AM      Component Value Range Comment   Valproic Acid Lvl 105.2 (*) 50.0 - 100.0 (ug/mL)    The  patient was seen today after being readmitted on 08/14/11 due to reported over-sedation.  The patient states that she feels her husband may have put too much medication in her pill box which resulted in her sedation.  However, on admission it was reported that the patient was "picking and choosing" which medication she would take.  The patient states that she sleeps well when she uses C-PAP but at home did not have this available due to her sleep study being scheduled for later in April.  She reports to sleeping well last night with the C-PAP and reports a good appetite.  She reports mild feelings of depression and mild to moderate anxiety.  She denies any auditory or visual hallucinations of delusional thinking.  She also denies any SI/HI.  Treatment Plan Summary:  1. Daily contact with patient to assess and evaluate symptoms and progress in treatment  2. Medication management  3. The patient will deny suicidal ideations or homicidal ideations for 48 hours prior to discharge and have a depression and anxiety rating of 3 or less. The patient will also deny any auditory or visual hallucinations or delusional thinking.   Plan:  1. Will continue the patient on her current medications.  2. Will decrease Elavil to 50 mgs po qhs due to excessive daytime sedation.  3. Will decrease the medication Lamictal to 150 mgs po qhs due to daytime sedation.  4. Will discontinue any further use of Klonopin due to excessive daytime sedation. 5. Will restart C-PAP for the patient's probable sleep apnea. 6. Nurse Practitioner will see if it is a possibility to change the patient's sleep study to an earlier date. 7. Laboratory studies reviewed.  8. Will obtain a new serum Depakote Level since the last level was not a trough level. 9. Will continue to monitor.   Jiovanny Burdell 08/15/2011, 3:03 PM

## 2011-08-15 NOTE — Progress Notes (Signed)
Patient ID: Danielle [name], female   DOB: 12-Feb-1971, 41 y.o.   MRN: [id number] Pt denies SI/HI, she reported on her self inventory that she slept well, appetite improving, 4/10 on hopelessness scale.  Jaquala is sleepy but arousal, no apparent injury from her fall, moves all extremities well, denies pain.  She ambulates by a wheelchair and has assistance when transferring.  High fall alert precautions in place and discussed with the patient.

## 2011-08-15 NOTE — Progress Notes (Signed)
Writer was called into the pt's room by the MHT and informed the pt had fallen while attempting to sit on the toilet. Pt was alert and oriented and explained the circumstances to the writer. Pt denied pain and was allowed to shower with the MHT present. After shower pt asked for the sandwich that had been put in the refrigerator for her prior to start of shift. Pt reminded to call before getting out of bed,and pt's bed was pushed closer to nightstand and call bell. Support and encouragement was offered.

## 2011-08-16 MED ORDER — MODAFINIL 200 MG PO TABS
100.0000 mg | ORAL_TABLET | Freq: Every day | ORAL | Status: DC
  Administered 2011-08-17 – 2011-08-19 (×3): 100 mg via ORAL
  Filled 2011-08-16 (×3): qty 1
  Filled 2011-08-16: qty 2

## 2011-08-16 MED ORDER — DIVALPROEX SODIUM ER 500 MG PO TB24
1000.0000 mg | ORAL_TABLET | Freq: Every day | ORAL | Status: DC
  Administered 2011-08-16 – 2011-08-18 (×3): 1000 mg via ORAL
  Filled 2011-08-16 (×5): qty 2

## 2011-08-16 NOTE — Progress Notes (Signed)
Pt attended discharge planning group and actively participated.  Pt presents with flat affect and depressed mood.  Pt ranks depression at a 5 and denies having anxiety or SI.  Pt reports being confused about her meds but will follow up with dr on this concern.  Pt will [facility] up with [name] for medication management.  No further needs voiced by pt at this time.  Safety planning and suicide prevention discussed.     Reyes Ivan, LCSWA 08/16/2011  10:19 AM

## 2011-08-16 NOTE — Progress Notes (Signed)
Pt is asleep at this time. Pt respirations are clear and unlabored. CPAP in use at this time. Pt appears to be in no signs of distress at this time. Pt safety remains with q23min checks.

## 2011-08-16 NOTE — Progress Notes (Signed)
[facility] Group Notes: (Counselor/Nursing/MHT/Case Management/Adjunct) 08/09/2011   @11 :00am Preventing Relapse  Type of Therapy:  Group Therapy  Participation Level:  Good  Participation Quality: Attentive, Sharing, Appropriate    Affect:  Blunted  Cognitive:  Appropriate  Insight:  Limited  Engagement in Group: Good  Engagement in Therapy:  Limited  Modes of Intervention:  Support and Exploration  Summary of Progress/Problems: Danielle [name] presents as less sedated than she has been and her speech is less garbled. However, she is still in a wheelchair and has somewhat of a blank expression/affect. Danielle [name] shared that her husband and children are a protective factor for her. She explored how she knows seeing her die and living without her would traumatize her children. Went on to talk about how husband has always been there for her and supported her, but that she often isolates and does not go out with him because she is embarrassed for him that he is seen with her. Counselor challenged this statement and tried to get Danielle [name] to see that she is projecting her thoughts/beliefs about herself onto her husband. She did not seem to understand this, but did go one processing the concept of risk and protective factors.    Billie Lade 08/09/2011 12:23 PM

## 2011-08-16 NOTE — Progress Notes (Signed)
Adult Services Patient-Family Contact/Session  Attendees:   Counselor, Danielle [name] (husband) via telephone  Goal(s):   To provide suicide prevention education, to obtain collateral information, to discuss discharge  Safety Concerns:  Husband has no concerns about safety, but is concerned about Danielle [name] being able to care for herself in this state.  Narrative:  Danielle [name] denied any concerns about Danielle [name] being suicidal or homicidal. He stated that his concerns are with her level of functioning and discussed at length how little she was able to function at her last discharge, stating that is not going to happen again this time. Danielle [name] was very aware of suicide prevention information, verbalized understanding and had no further questions. He verified that Danielle [name] does not have access to firearms. Danielle [name] also stated that he will be in at 11:30 on Monday 08/19/11 to meet with the doctor. He stated that Danielle [name] has seemed to be improved the last 2 days and if she is still feeling this well on Monday he will be fine with taking her home then. He also stated that he will not be taking her home in a wheelchair, so he expects her to be able to walk on her own by Monday.    Barrier(s):  Husband does not want Danielle [name] to return home until he believes she is better able to perform ADL's and is out of the wheelchair  Interventions:  Counselor collected collateral information. Counselor educated husband on risk, warning signs, and crisis contacts for suicide.  Recommendation(s):  Danielle [name] to remain on inpatient unit until she is better able to function.  Follow-up Required:  Yes   Explanation:  Husband will meet with Dr. Allena Katz and treatment team on Monday for follow up  Danielle [name] 08/16/2011, 1:38 PM

## 2011-08-16 NOTE — Progress Notes (Signed)
Adult Psychosocial Assessment Update Interdisciplinary Team  [facility] admissions[facility]discharges:  Admissions Discharges  Date: 07/30/2011 Date: 08/09/2011  Date: 03/14/2011 Date: 03/22/2011  Date: Date:  Date: Date:  Date: Date:   Changes since the last Psychosocial Assessment (including adherence to outpatient mental health and/or substance abuse treatment, situational issues contributing to decompensation and/or relapse). Patient reports since her last admission to [facility] she has not been able to stand up on   Her own. Patient reports she has also not been able to sleep.           Discharge Plan 1. Will you be returning to the same living situation after discharge?   Yes: X No:      If no, what is your plan?    Patient reports she plans to return home with her husband.       2. Would you like a referral for services when you are discharged? Yes:     If yes, for what services?  No:   X    Patient reports her follow up services were with Dr. Melvyn Neth in [place], [place].       Summary and Recommendations (to be completed by the evaluator) Patient is a 41yr old female. Patent admitted with diagnosis of Schizophrenia. Patient   Reports she has had difficulty sleeping and walking on her own since her last admission.  Patient would benefit from crisis stabilization, medication evaluation, psycho ed and   Group therapy, and case management for discharge planning.                 Signature:  Wilmon Arms, 08/16/2011 9:05 AM

## 2011-08-16 NOTE — Progress Notes (Signed)
[name] Family/Significant Other Suicide Prevention Education  Suicide Prevention Education:  Education Completed;  Archer Vise, husband [phone]), has been identified by the patient as the family member/significant other with whom the patient will be residing, and identified as the person(s) who will aid the patient in the event of a mental health crisis (suicidal ideations/suicide attempt).  With written consent from the patient, the family member/significant other has been provided the following suicide prevention education, prior to the and/or following the discharge of the patient.  The suicide prevention education provided includes the following:  Suicide risk factors  Suicide prevention and interventions  National Suicide Hotline telephone number  [facility] assessment telephone number  [facility] Emergency Assistance 911  [facility] and/or Residential Mobile Crisis Unit telephone number  Request made of family/significant other to:  Remove weapons (e.g., guns, rifles, knives), all items previously/currently identified as safety concern.    Remove drugs/medications (over-the-counter, prescriptions, illicit drugs), all items previously/currently identified as a safety concern.  [name] denied any concerns about Velecia being suicidal or homicidal. He stated that his concerns are with her level of functioning and discussed at length how little she was able to function at her last discharge, stating that is not going to happen again this time. [name] was very aware of suicide prevention information, verbalized understanding and had no further questions. He verified that [name] does not have access to firearms. [name] also stated that he will be in at 11:30 on Monday 08/19/11 to meet with the doctor. He stated that Aylin has seemed to be improved the last 2 days and if she is still feeling this well on Monday he will be fine with taking her home then. He  also stated that he will not be taking her home in a wheelchair, so he expects her to be able to walk on her own by Monday.   Billie Lade 08/16/2011, 1:34 PM

## 2011-08-16 NOTE — Progress Notes (Signed)
[facility] MD Progress Note  08/16/2011 5:18 PM  Diagnosis:  Axis I: Schizophrenia - Paranoid Type.  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  Posttraumatic Stress Disorder.  Panic Disorder with Agoraphobia.   The patient was seen today and reports the following:   ADL's: Intact.  Sleep: The patient reports to sleeping well last night and is still using C-PAP at night.  Appetite: The patient reports a good appetite today.   Mild>(1-10) >Severe  Hopelessness (1-10): 2  Depression (1-10): 2  Anxiety (1-10): 1-2   Suicidal Ideation: The patient adamantly denies any suicidal ideations today.  Plan: No  Intent: No  Means: No   Homicidal Ideation: The patient adamantly denies any homicidal ideations today.  Plan: No  Intent: No.  Means: No   General Appearance/Behavior: Appropriate and cooperative today with less oversedation.  Eye Contact: Good.  Speech: Appropriate in rate and volume today but with some mild slurring of speech noted.  Motor Behavior: wnl.  Level of Consciousness: Somnolent but Oriented x 3.  Mental Status: Somnolent but Oriented x 3.  Mood: Mildly depressed.  Affect: Mild to moderately constricted.  Anxiety Level: Mild anxiety reported.  Thought Process: wnl.  Thought Content: The patient denies any auditory or visual hallucinations or delusional thinking.  Perception:. wnl.  Judgment: Fair.  Insight: Fair.  Cognition: Oriented to person, place and time.  Sleep:  Number of Hours: 6.75    Vital Signs:Blood pressure 105/74, pulse 83, temperature 98 F (36.7 C), temperature source Oral, resp. rate 16, height 5\' 9"  (1.753 m), weight 96.163 kg (212 lb), SpO2 95.00%.  Current Medications: Current Facility-Administered Medications  Medication Dose Route Frequency Provider Last Rate Last Dose  . acetaminophen (TYLENOL) tablet 650 mg  650 mg Oral Q6H PRN Viviann Spare, NP      . alum & mag hydroxide-simeth (MAALOX/MYLANTA) 200-200-20 MG/5ML suspension 30  mL  30 mL Oral Q4H PRN Viviann Spare, NP      . amitriptyline (ELAVIL) tablet 50 mg  50 mg Oral QHS Curlene Labrum Toy Samarin, MD   50 mg at 08/15/11 2149  . benztropine (COGENTIN) tablet 1 mg  1 mg Oral BH-qamhs Curlene Labrum Bekim Werntz, MD   1 mg at 08/16/11 0839  . ciprofloxacin (CIPRO) tablet 500 mg  500 mg Oral BID Curlene Labrum Jalyric Kaestner, MD   500 mg at 08/16/11 0839  . divalproex (DEPAKOTE ER) 24 hr tablet 1,000 mg  1,000 mg Oral QHS Curlene Labrum Britiny Defrain, MD      . lamoTRIgine (LAMICTAL) tablet 150 mg  150 mg Oral QHS Curlene Labrum Kyrielle Urbanski, MD   150 mg at 08/15/11 2150  . levothyroxine (SYNTHROID, LEVOTHROID) tablet 50 mcg  50 mcg Oral QAC breakfast Curlene Labrum Amauris Debois, MD   50 mcg at 08/16/11 0656  . magnesium hydroxide (MILK OF MAGNESIA) suspension 30 mL  30 mL Oral Daily PRN Viviann Spare, NP      . modafinil (PROVIGIL) tablet 100 mg  100 mg Oral Daily Curlene Labrum Brownie Gockel, MD      . nicotine (NICODERM CQ - dosed in mg/24 hours) patch 21 mg  21 mg Transdermal Daily Curlene Labrum Permelia Bamba, MD   21 mg at 08/16/11 0658  . risperiDONE (RISPERDAL) tablet 2 mg  2 mg Oral BH-qamhs Curlene Labrum Thaer Miyoshi, MD   2 mg at 08/16/11 0839  . sertraline (ZOLOFT) tablet 150 mg  150 mg Oral Daily Curlene Labrum Aleyna Cueva, MD   150 mg at 08/16/11 0800  .  DISCONTD: divalproex (DEPAKOTE ER) 24 hr tablet 1,500 mg  1,500 mg Oral QHS Curlene Labrum Reeta Kuk, MD   1,500 mg at 08/15/11 2149   Lab Results:  Results for orders placed during the hospital encounter of 08/14/11 (from the past 48 hour(s))  VALPROIC ACID LEVEL     Status: Normal   Collection Time   08/15/11  7:54 PM      Component Value Range Comment   Valproic Acid Lvl 99.7  50.0 - 100.0 (ug/mL)    The patient was seen today and reports to sleeping well now that she is using C-PAP.  She reports a good appetite and reports mild feelings of sadness, anhedonia and depressed mood.  The patient denies any suicidal or homicidal ideations today as well as any auditory or visual hallucinations or delusional thinking.  The patient continues to report some excessive daytime somnolence but states this is improved since restarting the C-PAP.  It was explained to the patient that the Case Manager has arranged a Family meeting on Monday morning with her husband and the treatment team to discuss concerns the husband has about her returning home.  Treatment Plan Summary:  1. Daily contact with patient to assess and evaluate symptoms and progress in treatment  2. Medication management  3. The patient will deny suicidal ideations or homicidal ideations for 48 hours prior to discharge and have a depression and anxiety rating of 3 or less. The patient will also deny any auditory or visual hallucinations or delusional thinking.   Plan:  1. Will continue the patient on her current medications.  2. Will decrease the medication Depakote ER to 1000 mgs po qhs due to daytime sedation.  3. Will start the medication Provigil 100 mgs po q am to help address excessive daytime somnolence. 4. Will order a repeat CMP, Serum Depakote Level, and TCA level to further assess the patient's current metabolic status. 5. Will continue to monitor.  6. Laboratory Studies reviewed. 7. A family meeting is scheduled for Monday with the patient's husband to discuss his concerns about her returning home.  Britt Petroni 08/16/2011, 5:18 PM

## 2011-08-16 NOTE — Tx Team (Signed)
Interdisciplinary Treatment Plan Update (Adult)  Date:  08/16/2011  Time Reviewed:  10:20 AM   Progress in Treatment: Attending groups: Yes Participating in groups:  Yes Taking medication as prescribed: Yes Tolerating medication:  Yes Family/Significant other contact made:  Yes Patient understands diagnosis:  Yes Discussing patient identified problems/goals with staff:  Yes Medical problems stabilized or resolved:  Yes Denies suicidal/homicidal ideation: Yes Issues/concerns per patient self-inventory:  None identified Other: N/A  New problem(s) identified: None Identified  Reason for Continuation of Hospitalization: Anxiety Depression Medication stabilization  Interventions implemented related to continuation of hospitalization: mood stabilization, medication monitoring and adjustment, group therapy and psycho education, safety checks q 15 mins  Additional comments: N/A  Estimated length of stay: 3-4 days  Discharge Plan: Pt will follow up with [name] for medication management.    New goal(s): N/A  Review of initial/current patient goals per problem list:    1. Goal(s): Eliminate SI/thoughts of self harm  Met: Yes Target date: d/c  As evidenced by: Danielle [name] denies SI. 2. Goal (s): Eliminate/Reduce psychosis (A/H)  Met: Yes Target date: d/c  As evidenced by: Patient will advise of no longer have A/H or occurrences are less frequent 3. Goal(s): Reduce feelings of hopelessness/helplessness (Currently rated at 5 today)  Met: No  Target date: d/c  As evidenced by: Patient will rate symptoms at four or below on discharge 4. Goal(s): Stabilize on medications  Met: No  Target date: d/c  As evidenced by: Patient will report medications are working - symptoms improved    Attendees: Patient:     Family:     Physician:  Orson Aloe, MD  08/16/2011  10:20 AM   Nursing:   Stephannie Li, RN 08/16/2011 10:22 AM   Case Manager:  Reyes Ivan, LCSWA 08/16/2011  10:20 AM     Counselor:  Angus Palms, LCSW 08/16/2011  10:20 AM   Other:  Juline Patch, LCSW 08/16/2011  10:20 AM   Other: 08/16/2011  10:20 AM   Other:     Other:      Scribe for Treatment Team:   Carmina Miller, 08/16/2011 , 10:20 AM

## 2011-08-16 NOTE — Progress Notes (Signed)
Pt has been somewhat talkative this shift with cont garbled speech and mild confusion as to time of day. Rates depression and hopelessness at 9 with low energy,fair sleep and a good appetite.  Denies SI and contracts for safety.  Cont with fall risk precautions and pt is compliant with w/c use.  Compliant with scheduled medications and no prn's requested.  Cont current POC and 15' checks for safety.

## 2011-08-16 NOTE — Progress Notes (Signed)
08/16/2011         Time: 1415      Group Topic/Focus: The focus of this group is on discussing various styles of communication and communicating assertively using 'I' (feeling) statements.  Participation Level: Minimal  Participation Quality: Redirectable  Affect: Labile  Cognitive: Oriented  Additional Comments: Patient very childlike, initially refused group, saying she was being held hostage here and didn't want to go to "any stinkin' groups." Patient came with encouragement, was sticking her tongue out at RT, eating potato chips while spilling them all over herself.   Danielle [name] 08/16/2011 3:52 PM

## 2011-08-17 DIAGNOSIS — F200 Paranoid schizophrenia: Secondary | ICD-10-CM

## 2011-08-17 DIAGNOSIS — F339 Major depressive disorder, recurrent, unspecified: Secondary | ICD-10-CM

## 2011-08-17 LAB — COMPREHENSIVE METABOLIC PANEL
ALT: 12 U/L (ref 0–35)
Alkaline Phosphatase: 65 U/L (ref 39–117)
CO2: 24 mEq/L (ref 19–32)
Calcium: 9.4 mg/dL (ref 8.4–10.5)
Chloride: 103 mEq/L (ref 96–112)
GFR calc Af Amer: 82 mL/min — ABNORMAL LOW (ref >90)
GFR calc non Af Amer: 71 mL/min — ABNORMAL LOW (ref >90)
Glucose, Bld: 107 mg/dL — ABNORMAL HIGH (ref 70–99)
Sodium: 138 mEq/L (ref 135–145)
Total Bilirubin: 0.1 mg/dL — ABNORMAL LOW (ref 0.3–1.2)

## 2011-08-17 NOTE — Progress Notes (Signed)
08/17/2011 Nursing D 1400 [facility] [name] remains sad, depressed..but seen out in the milieu today.Marland KitchenMarland KitchenShe denies SI ( on her self inventory), rates her depression and hopelessess 2 / 1 and says her goal today is to see the doctor". A She gets around via wheelchair, she takes her medications as ordered by the MD . R Safety is maintained and POC includes continuing to maintain pt on HIGH FALL RISK, moniotr her response to her medicaitons and cont with POC PD RN [facility]

## 2011-08-17 NOTE — Progress Notes (Signed)
  Danielle [name] is a 41 y.o. female [id number] 02-Jun-1970  08/14/2011 Principal Problem:  *Schizophrenia, paranoid type Active Problems:  Panic disorder with agoraphobia  Major depressive disorder, recurrent   Mental Status: Seen in room somewhat drowsy. Denies SI/HI/AVH.    Subjective/Objective: Understands that husband has concerns and is to meet with the team on Monday. Hasn't had time to respond to Provigil yet today.    Filed Vitals:   08/17/11 0701  BP: 103/73  Pulse: 104  Temp:   Resp: 18    Lab Results:   BMET    Component Value Date/Time   NA 139 08/13/2011 1350   K 4.0 08/13/2011 1350   CL 104 08/13/2011 1350   CO2 26 08/13/2011 1350   GLUCOSE 98 08/13/2011 1350   BUN 17 08/13/2011 1350   CREATININE 0.95 08/13/2011 1350   CALCIUM 9.1 08/13/2011 1350   GFRNONAA 73* 08/13/2011 1350   GFRAA 84* 08/13/2011 1350    Medications:  Scheduled:     . amitriptyline  50 mg Oral QHS  . benztropine  1 mg Oral BH-qamhs  . ciprofloxacin  500 mg Oral BID  . divalproex  1,000 mg Oral QHS  . lamoTRIgine  150 mg Oral QHS  . levothyroxine  50 mcg Oral QAC breakfast  . modafinil  100 mg Oral Daily  . nicotine  21 mg Transdermal Daily  . risperiDONE  2 mg Oral BH-qamhs  . sertraline  150 mg Oral Daily  . DISCONTD: divalproex  1,500 mg Oral QHS     PRN Meds acetaminophen, alum & mag hydroxide-simeth, magnesium hydroxide Plan: continue current plan of care no med changes indicated at this time.   Danielle [name],Danielle D. 08/17/2011

## 2011-08-17 NOTE — Progress Notes (Signed)
[facility] Group Notes:  (Counselor/Nursing/MHT/Case Management/Adjunct)  08/17/2011 1315  Type of Therapy:  Group Therapy  Participation Level:  Active  Participation Quality:  Appropriate  Affect:  Appropriate  Cognitive:  Appropriate  Insight:  Good  Engagement in Group:  Good  Engagement in Therapy:  Good  Modes of Intervention:  Clarification and Problem-solving, Socialization, Support   Summary of Progress/Problems: Pt. attended and participated in group session on self-sabotage. Pt was asked to identify how their sabotaging thoughts and behaviors influence their goals. Pt was encouraged to explore ways in which they can reframe their destructive beliefs into constructive beliefs. Pt stated that she self-sabotages by isolating herself from social situations. Pt stated that instead of going to [employer] during the day, pt purposely waits until it is late to shop because she can avoid being around other people. Pt stated that when she is around a lot of people she gets nervous.   Crosswell, Desiree 08/17/2011, 4:24 PM

## 2011-08-17 NOTE — Progress Notes (Signed)
Writer notices this pt's speech becoming clearer than her baseline. Pt has been talkative and seems more alert. Pt woke up around 2030 not remembering whether she ate dinner to later realized that she did. Pt was given a salad to consume with her initial report. Pt has been pleasant this evening. Writer discussed how she was ready to go home, but her husband told the doctor that she is not ready.  Pt affect was bright during our 1:1 interaction. Pt Cpap applied. Pt reports decrease depression and -SI. Continued support and availability as needed has been extended to this pt. Pt safety remains with q44min checks.

## 2011-08-18 NOTE — Progress Notes (Signed)
Writer entered patients room and observed her lying in bed awake. Writer introduced self to patient as her nurse for the shift and inquired as to how her day had been. Patient reported that her husband had visited and she had 2 panic attacks today. She reported that she was able to do some deep breathing to help with her attacks. Patient voiced concern because she reports that she is to be discharged on Monday and is not sure if she will be ready because her husband wants her walking without the wheelchair by then. Patient was supported and encouraged, informed of her hs scheduled medications and is agreeable to taking them. Currently denies having pain, -si/hi/a/v hall. Safety maintained on unit, will continue to monitor.

## 2011-08-18 NOTE — Progress Notes (Signed)
08/18/2011  Nursing 1900 D Danielle [name] has had a fair day....taking her meds as ordered. Attending her groups and processing ways to get her needs met in a healthier fashion. A She speaks with an exagerrated fashion...and has reported twice today, that she fell. This staff member has helped pt transfer and she demonstrates good strength and balance. R PT consult placed for gait training. Will continue to support. PD RN [facility]

## 2011-08-18 NOTE — Progress Notes (Signed)
[facility] Group Notes:  (Counselor/Nursing/MHT/Case Management/Adjunct)  08/18/2011 1315  Type of Therapy:  Group Therapy  Participation Level:  Active  Participation Quality:  Appropriate  Affect:  Appropriate  Cognitive:  Appropriate  Insight:  Good  Engagement in Group:  Good  Engagement in Therapy:  Good  Modes of Intervention:  Clarification, Problem-solving, Socialization and Support  Summary of Progress/Problems:  Pt. participated in group session on supports. Pt. was asked what support means to them, who are their supports, what is the difference between unhealthy and healthy supports and what they can do when their support is not there. Pt stated that her husband supports her and that he is her sunshine. Pt states that her pride gets in the way of her reaching out for support. Pt states that she expects her support to be there all the time. Pt states that in order to support herself she wil swing on the swings or take a hot bath.   Erasmus Bistline 08/18/2011, 3:22 PM

## 2011-08-18 NOTE — Progress Notes (Signed)
[facility] Group Notes:  (Counselor/Nursing/MHT/Case Management/Adjunct)  08/18/2011 0830  Type of Therapy:  Discharge Planning  Participation Level:  Active  Summary of Progress/Problems: Pt. attended and participated in aftercare planning group. [facility] support group information was given as well as information on suicide prevention information, warning signs to look for with suicide and crisis line numbers to use. Pt states that she is having increased anxiety about being D/C'd tomorrow. Pt states that she does not feel ready to be D/C "into the real world and back into the same issues". Pt states that she would like long term treatment after D/C. Pt stated that she also does not want to stay at [facility], which contradicted her previous statement of having anxiety about going home and not feeling "ready". Pt stated that she is experiencing panic attacks when she thinks about D/C. Pt denies SI/HI.   Crosswell, Desiree 08/18/2011, 10:58 AM

## 2011-08-18 NOTE — Progress Notes (Signed)
  Danielle [name] is a 41 y.o. female [id number] [birth year]  08/14/2011 Principal Problem:  *Schizophrenia, paranoid type Active Problems:  Panic disorder with agoraphobia  Major depressive disorder, recurrent   Mental Status: Alert & oriented mood is allright denies SI/HI/AVH.  Subjective/Objective: Happily anticipates discharge tomorrow. Advised her that she needed to be out of the wheelchair. Within the hour while leaning forward to pick up a pencil she slid out of the chair and was calling for "help". Says the Provigil actually makes her groggy.   Filed Vitals:   08/18/11 0834  BP: 113/81  Pulse: 74  Temp:   Resp:     Lab Results:   BMET    Component Value Date/Time   NA 138 08/17/2011 1948   K 4.1 08/17/2011 1948   CL 103 08/17/2011 1948   CO2 24 08/17/2011 1948   GLUCOSE 107* 08/17/2011 1948   BUN 15 08/17/2011 1948   CREATININE 0.97 08/17/2011 1948   CALCIUM 9.4 08/17/2011 1948   GFRNONAA 71* 08/17/2011 1948   GFRAA 82* 08/17/2011 1948    Medications:  Scheduled:     . amitriptyline  50 mg Oral QHS  . benztropine  1 mg Oral BH-qamhs  . ciprofloxacin  500 mg Oral BID  . divalproex  1,000 mg Oral QHS  . lamoTRIgine  150 mg Oral QHS  . levothyroxine  50 mcg Oral QAC breakfast  . modafinil  100 mg Oral Daily  . nicotine  21 mg Transdermal Daily  . risperiDONE  2 mg Oral BH-qamhs  . sertraline  150 mg Oral Daily     PRN Meds acetaminophen, alum & mag hydroxide-simeth, magnesium hydroxide Plan: Has already had the Provigil today-can be discussed at treatment team tomorrow.   [name],[name] 08/18/2011

## 2011-08-19 MED ORDER — SERTRALINE HCL 50 MG PO TABS
150.0000 mg | ORAL_TABLET | Freq: Every day | ORAL | Status: DC
  Filled 2011-08-19 (×2): qty 9

## 2011-08-19 MED ORDER — LAMOTRIGINE 150 MG PO TABS: 150.0000 mg | ORAL_TABLET | Freq: Every day | ORAL | Status: DC

## 2011-08-19 MED ORDER — RISPERIDONE 2 MG PO TABS: 2.0000 mg | ORAL_TABLET | ORAL | Status: AC

## 2011-08-19 MED ORDER — DIVALPROEX SODIUM ER 500 MG PO TB24: 1000.0000 mg | ORAL_TABLET | Freq: Every day | ORAL | Status: DC

## 2011-08-19 MED ORDER — MODAFINIL 100 MG PO TABS: 100.0000 mg | ORAL_TABLET | Freq: Every day | ORAL | Status: DC

## 2011-08-19 MED ORDER — AMITRIPTYLINE HCL 50 MG PO TABS: 50.0000 mg | ORAL_TABLET | Freq: Every day | ORAL | Status: DC

## 2011-08-19 NOTE — Tx Team (Addendum)
Interdisciplinary Treatment Plan Update (Adult)  Date:  08/19/2011  Time Reviewed:  10:54 AM   Progress in Treatment: Attending groups: Yes Participating in groups:  Yes Taking medication as prescribed: Yes Tolerating medication:  Yes Family/Significant other contact made:  Yes Patient understands diagnosis:  Yes Discussing patient identified problems/goals with staff:  Yes Medical problems stabilized or resolved:  Yes Denies suicidal/homicidal ideation: Yes Issues/concerns per patient self-inventory:  None identified Other: N/A  New problem(s) identified: None Identified  Reason for Continuation of Hospitalization: Stable to d/c today  Interventions implemented related to continuation of hospitalization: Stable to d/c  Additional comments: N/A  Estimated length of stay: D/C today  Discharge Plan: Pt can follow up with [name] for medication management.  SW referred pt to [facility] in [place] for [employer]  New goal(s): N/A  Review of initial/current patient goals per problem list:    1.  Goal(s): Reduce depressive symptoms  Met:  Yes  Target date: by discharge  As evidenced by: Reducing depression from a 10 to a 3 as reported by pt.Pt ranks at a 1-2 today.   2.  Goal (s): Reduce/Eliminate suicidal ideation  Met:  Yes  Target date: by discharge  As evidenced by: pt reporting no SI.    3.  Goal(s): Reduce anxiety symptoms  Met:  Yes  Target date: by discharge  As evidenced by: Reduce anxiety from a 10 to a 3 as reported by pt. Pt ranks at a 1-2 today.    Attendees: Patient:  Danielle [name] 08/19/2011 11:39 AM   Family:  Lerry Liner (husband) 08/19/2011 11:39 AM   Physician:  Franchot Gallo, MD  08/19/2011  10:54 AM   Nursing:   Tacy Learn, RN 08/19/2011 10:58 AM   Case Manager:  Reyes Ivan, LCSWA 08/19/2011  10:54 AM   Counselor:  Veto Kemps, MT-BC 08/19/2011  10:54 AM   Other: Ambrose Mantle, LCSW   Other:    Other:     Other:        Scribe for Treatment Team:   Carmina Miller, 08/19/2011 , 10:54 AM

## 2011-08-19 NOTE — Discharge Summary (Signed)
[name] Discharge Summary Note  Patient:  Danielle [name] is an 41 y.o., female MRN:  [id number] DOB:  [birth year] Patient phone:  [phone] (home)  Patient address:   [address] [place] [address],   Date of Admission:  08/14/2011 Date of Discharge: 08/19/2011  Diagnosis:   AXIS I:  Schizophrenia, Paranoid Type; Major Depressive disorder AXIS II:  No Diagnosis AXIS III:  Altered Mental STatus - resolved; R/O Obstructive Sleep Apnea; Hypothyroidism; History of Seizure Disorder Past Medical History  Diagnosis Date  . Depressed   . Seizures   . Heart murmur   . Hypothyroidism   . Anemia   . Headache   . Anxiety    AXIS IV:  Supportive husband and stable home is an asset AXIS V: 60  Level of Care:  OP  Hospital Course:  Danielle [name] presented with her husband about one week after previous discharge from unit. He was complaining that she was overly sedated, and could not bathe herself, toilet, or even eat independently due to her level of daytime sedation. We noted her gait to be unstable and she appeared very drowsy. She was initially also complaining of some auditory hallucinations and exacerbation of her depression.  She was admitted for mood disorders program. Her initial valproic level was found to be 105.2. With her husband, and the patient were not revealing regarding the source of her sedation. It is not clear if she was not taking her medications as directed, her becoming sedated do to chronic sleep issues, for which a consult is scheduled with [facility].  In any case we elected to significantly decrease her medications, and eliminating Klonopin and Haldol were eliminated. Depakote was decreased to 1000 mg by mouth each bedtime amitriptyline was decreased to 50 mg each bedtime. She did very well on the decreased medications and by the age of suicidal thoughts. Her husband came in for a family session pleased with how well she was functioning. Followup  valproic acid level was noted to be 53.8. She is discharged today to outpatient treatment.  Consults:  None  Significant Diagnostic Studies:  See above  Discharge Vitals:   Blood pressure 97/74, pulse 76, temperature 97.6 F (36.4 C), temperature source Oral, resp. rate 17, height 5\' 9"  (1.753 m), weight 96.163 kg (212 lb), SpO2 98.00%.  Mental Status Exam: See Mental Status Examination and Suicide Risk Assessment completed by Danielle [name] prior to discharge.  Discharge destination:  Home  Is patient on multiple antipsychotic therapies at discharge:  No   Has Patient had three or more failed trials of antipsychotic monotherapy by history:  No  Recommended Plan for Multiple Antipsychotic Therapies: N/A  Discharge Orders    Future Appointments: Provider: Department: Dept Phone: Center:   08/29/2011 10:15 AM Danielle Share, MD [facility] [phone] None     Medication List  As of 08/19/2011 12:31 PM   STOP taking these medications         clonazePAM 1 MG tablet      clonazePAM 2 MG tablet      haloperidol 5 MG tablet      prazosin 2 MG capsule         TAKE these medications      Indication    amitriptyline 50 MG tablet   Commonly known as: ELAVIL   Take 1 tablet (50 mg total) by mouth at bedtime. For depression and sleep       benztropine 1 MG tablet   Commonly  known as: COGENTIN   Take 1 tablet (1 mg total) by mouth 2 (two) times daily in the am and at bedtime.. For eps.       divalproex 500 MG 24 hr tablet   Commonly known as: DEPAKOTE ER   Take 2 tablets (1,000 mg total) by mouth at bedtime. For mood stability.       lamoTRIgine 150 MG tablet   Commonly known as: LAMICTAL   Take 1 tablet (150 mg total) by mouth at bedtime. For mood stability       levothyroxine 50 MCG tablet   Commonly known as: SYNTHROID, LEVOTHROID   Take 1 tablet (50 mcg total) by mouth daily. Thyroid Supplement.       modafinil 100 MG tablet   Commonly known as: PROVIGIL     Take 1 tablet (100 mg total) by mouth daily. For excessive daytime sedation       risperiDONE 2 MG tablet   Commonly known as: RISPERDAL   Take 1 tablet (2 mg total) by mouth 2 (two) times daily in the am and at bedtime.. For psychosis.       sertraline 50 MG tablet   Commonly known as: ZOLOFT   Take 3 tablets (150 mg total) by mouth daily with breakfast. For depression and anxiety.            Follow-up Information    Follow up with [name] on 08/20/2011. (Danielle [name] out of the office today - will call you for follow up appointment tomorrow)    Contact information:   [address] [place], [place] [address] [phone]      Follow up with [facility]. (DO NOT D/C W/O APPT HERE!)    Contact information:   [address] [place], [place] [address] [phone]         Follow-up recommendations:  Activity:  unrestricted Diet:  regular  Patient has a previously made appointment with [facility] for evaluation for sleep apnea on April 25th.    Signed: Deshone Lyssy A 08/19/2011, 12:31 PM

## 2011-08-19 NOTE — [facility] Suicide Risk Assessment (Signed)
Suicide Risk Assessment  Discharge Assessment     Demographic factors:  Caucasian;Low socioeconomic status  Current Mental Status Per Nursing Assessment::   On Admission:  Self-harm thoughts At Discharge:  AO x 3.  The patient denies any auditory or visual hallucinations or delusional thinking.  She denies any SI/HI.  Current Mental Status Per Physician:  Diagnosis:  Axis I: Schizophrenia - Paranoid Type.  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  Posttraumatic Stress Disorder.  Panic Disorder with Agoraphobia.   The patient was seen today and reports the following:   ADL's: Intact.  Sleep: The patient reports to sleeping well last night and is still using C-PAP at night.  Appetite: The patient reports a good appetite today.   Mild>(1-10) >Severe  Hopelessness (1-10): 0  Depression (1-10): 0  Anxiety (1-10): 2   Suicidal Ideation: The patient adamantly denies any suicidal ideations today.  Plan: No  Intent: No  Means: No   Homicidal Ideation: The patient adamantly denies any homicidal ideations today.  Plan: No  Intent: No.  Means: No   General Appearance/Behavior: The patient was alert this morning and was appropriate and cooperative.  Eye Contact: Good.  Speech: Appropriate in rate and volume today with no slurring of speech noted.  Motor Behavior: wnl.  Level of Consciousness: Alert and Oriented x 3.  Mental Status: Alert and Oriented x 3.  Mood: Essentially Euthymic.  Affect: Bright and Full. Anxiety Level: Mild anxiety reported.  Thought Process: wnl.  Thought Content: The patient denies any auditory or visual hallucinations or delusional thinking.  Perception:. wnl.  Judgment: Fair to Good.  Insight: Fair to Good.  Cognition: Oriented to person, place and time.   Loss Factors: Financial problems / change in socioeconomic status  Historical Factors: Prior suicide attempts  Risk Reduction Factors:   Good Primary Support System.  Good Access to  Medical Care.  Continued Clinical Symptoms:  Schizophrenia:   Paranoid or undifferentiated type More than one psychiatric diagnosis Previous Psychiatric Diagnoses and Treatments Medical Diagnoses and Treatments/Surgeries  Discharge Diagnoses:   AXIS I:   Schizophrenia - Paranoid Type.    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    Posttraumatic Stress Disorder.    Panic Disorder with Agoraphobia.  AXIS II:   Deferred. AXIS III:   1.  Seizure Disorder.   2.  Heart Murmur.   3.  Hypothyroidism.   4.  Frequent Headaches.   5.  Anemia.  AXIS IV: Chronic Mental Illness.    AXIS V:   GAF at time of Admission approximately 35.  GAF at time of Discharge approximately 55.  Cognitive Features That Contribute To Risk:  Thought constriction (tunnel vision)    The patient was seen today and reports to sleeping well on the C-PAP. She reports a good appetite and denies any significant depressive symptoms.  The patient reports some mild anxiety but denies any auditory or visual hallucinations or delusional thinking.  She also adamantly denies any SI/HI.  Time was spent discussing with the patient and her husband the need for the patient to take all of her medications only as directed.  It was also discussed the importance for her to keep her sleep study appointment to begin to address her sleep apnea.  The patient agreed with this and both she and her husband felt she was ready for discharge to outpatient follow up.  Treatment Plan Summary:  1. Daily contact with patient to assess and evaluate symptoms and progress  in treatment  2. Medication management  3. The patient will deny suicidal ideations or homicidal ideations for 48 hours prior to discharge and have a depression and anxiety rating of 3 or less. The patient will also deny any auditory or visual hallucinations or delusional thinking.  4. The patient will deny any symptoms of substance withdrawal.  Plan:  1. Will continue the  patient on her current medications.  2. Will continue to monitor.  3. Laboratory Studies reviewed.  4. A family meeting was arranged with the patient's husband and he and the patient feels she is doing very well and is ready for discharge. 5. The patient will be discharged to outpatient follow up as requested.  Suicide Risk:  Minimal: No identifiable suicidal ideation.  Patients presenting with no risk factors but with morbid ruminations; may be classified as minimal risk based on the severity of the depressive symptoms  Plan Of Care/Follow-up recommendations:  Activity:  As tolerated. Diet:  Heart Healthy Diet. Other:  Please take all medications only as directed and keep all scheduled follow up appointments.  Danielle [name] 08/19/2011, 12:43 PM

## 2011-08-19 NOTE — Progress Notes (Signed)
Patient ID: Danielle [name], female   DOB: 09/09/1970, 41 y.o.   MRN: [id number] Has been out on the hall this evening, sitting in a wheelchair and rolling herself around the hall and dayroom, interacting with select peers.  Rolled up to med window for hs meds, but had been up in dayroom walking around without issues.  Stated is using the wheelchair because she had felt dizzy earlier and thought it could happen again. Was assisted with  cpap set-up, currently in bed.  Will continue to monitor for safety.

## 2011-08-19 NOTE — Progress Notes (Signed)
[facility] Case Management Discharge Plan:  Will you be returning to the same living situation after discharge: Yes,  return home At discharge, do you have transportation home?:Yes,  husband to pick up pt Do you have the ability to pay for your medications:Yes,  access to meds   Release of information consent forms completed and in the chart;  Patient's signature needed at discharge.  Patient to Follow up at:  Follow-up Information    Follow up with [name] on 08/20/2011. (Dr. [name] out of the office today - will call you for follow up appointment tomorrow)    Contact information:   [address] [place], [place] [address] [phone]      Follow up with [facility] on 08/26/2011. (Appointment scheduled at 12:30 pm with Christina (intake))    Contact information:   [address] [place], [place] [address] [phone]         Patient denies SI/HI:   Yes,  denies SI/HI    Safety Planning and Suicide Prevention discussed:  Yes,  discussed with pt  Barrier to discharge identified:No.  Summary and Recommendations: Pt attended discharge planning group and actively participated.  Pt presents with calm mood and affect.  Pt ranks depression and anxiety at a 1-2 today.  Pt denies SI.  No recommendations from SW.  No further needs voiced by pt.  Pt stable to discharge.     Carmina Miller 08/19/2011, 1:26 PM

## 2011-08-19 NOTE — Progress Notes (Addendum)
Pt. coopertative and for discharge today. Stated she just is having a hard time talking to people. Speech was clear and pt./ did not appear to have difficulty expressing herself. Pt. Uses a wheelchair a. Pt. Denies SI or HI and contracts for safety.No apparent difficulty breathuing and does not appear sob at this time. Contracts .Pt appears excited about going home with husband

## 2011-08-20 LAB — AMITRIPTYLINE LEVEL: Amitrip+Nortrip: 213 mcg/L (ref 100–250)

## 2011-08-23 NOTE — Progress Notes (Signed)
Patient Discharge Instructions:  Psychiatric Admission Assessment Note Provided,  08/22/2011 After Visit Summary (AVS) Provided,  08/22/2011 Face Sheet Provided, 08/22/2011 Faxed/Sent to the Next Level Care provider:  08/22/2011 Provided Suicide Risk Assessment - Discharge Assessment 08/22/2011  Faxed to [facility] @ [phone] And no fax number available for [name] - Dr, [name], sent via certified mail on 08/23/2011 to  [address] [place] [address]  Danielle [name], 08/23/2011, 12:36 PM

## 2011-08-29 ENCOUNTER — Ambulatory Visit (INDEPENDENT_AMBULATORY_CARE_PROVIDER_SITE_OTHER): Payer: BC Managed Care – PPO | Admitting: Pulmonary Disease

## 2011-08-29 ENCOUNTER — Encounter: Payer: Self-pay | Admitting: Pulmonary Disease

## 2011-08-29 VITALS — BP 90/78 | HR 79 | Temp 98.4°F | Ht 72.0 in | Wt 216.8 lb

## 2011-08-29 DIAGNOSIS — G4733 Obstructive sleep apnea (adult) (pediatric): Secondary | ICD-10-CM

## 2011-08-29 NOTE — Assessment & Plan Note (Signed)
The patient's history is most consistent with clinically significant sleep disordered breathing.  I had a long discussion with her about sleep apnea, including its impact to her quality of life and cardiovascular health.  I think she needs to have a sleep study for diagnosis, and the patient is agreeable.  I will arrange followup to discuss the results once they are available.

## 2011-08-29 NOTE — Patient Instructions (Signed)
Will set up for a sleep study, and arrange followup to discuss once results are available.  Work on weight loss

## 2011-08-29 NOTE — Progress Notes (Signed)
  Subjective:    Patient ID: Danielle [name], female    DOB: 1971/03/11, 41 y.o.   MRN: [id number]  HPI The patient is a 41 year old female who been asked to see for possible obstructive sleep apnea.  The patient recently was admitted to behavioral health for depression, and was noted to have apnea during sleep.  She was placed on CPAP while at behavioral health, and saw significant improvement in her sleep and daytime alertness.  Since she has been at home, she has noticed frequent awakenings with fragmentation of her sleep, and does not feel rested in the mornings upon arising.  She has been noted to have loud snoring, and also describes classic choking arousals.  The patient has significant daytime sleepiness with any period of in activity, and feels tired throughout the day.  She does very little driving currently.  The patient states that her weight is up 95 pounds over the last 2 years, and her Epworth score today is abnormal at 16.  Sleep Questionnaire: What time do you typically go to bed?( Between what hours) 9-10 pm How long does it take you to fall asleep? 2 to 3 hours How many times during the night do you wake up? 5 What time do you get out of bed to start your day? 0800 Do you drive or operate heavy machinery in your occupation? No How much has your weight changed (up or down) over the past two years? (In pounds) 95 lb (43.092 kg) Have you ever had a sleep study before? No Do you currently use CPAP? No Do you wear oxygen at any time? No    Review of Systems  Constitutional: Positive for unexpected weight change. Negative for fever.  HENT: Negative for ear pain, nosebleeds, congestion, sore throat, rhinorrhea, sneezing, trouble swallowing, dental problem, postnasal drip and sinus pressure.   Eyes: Negative for redness and itching.  Respiratory: Positive for shortness of breath. Negative for cough, chest tightness and wheezing.   Cardiovascular: Negative for palpitations and leg swelling.    Gastrointestinal: Negative for nausea and vomiting.  Genitourinary: Negative for dysuria.  Musculoskeletal: Negative for joint swelling.  Skin: Negative for rash.  Neurological: Positive for headaches.  Hematological: Does not bruise/bleed easily.  Psychiatric/Behavioral: Positive for dysphoric mood. The patient is nervous/anxious.        Objective:   Physical Exam Constitutional:  Obese female, no acute distress  HENT:  Nares patent without discharge  Oropharynx without exudate, palate and uvula are mildly elongated.  Small oral cavity, small lower jaw with overbite.   Eyes:  Perrla, eomi, no scleral icterus  Neck:  No JVD, no TMG  Cardiovascular:  Normal rate, regular rhythm, no rubs or gallops.  No murmurs        Intact distal pulses  Pulmonary :  Normal breath sounds, no stridor or respiratory distress   No rales, rhonchi, or wheezing  Abdominal:  Soft, nondistended, bowel sounds present.  No tenderness noted.   Musculoskeletal:  No lower extremity edema noted.  Lymph Nodes:  No cervical lymphadenopathy noted  Skin:  No cyanosis noted  Neurologic:  Alert, appropriate, moves all 4 extremities without obvious deficit.         Assessment & Plan:

## 2011-09-18 ENCOUNTER — Ambulatory Visit (HOSPITAL_BASED_OUTPATIENT_CLINIC_OR_DEPARTMENT_OTHER): Payer: BC Managed Care – PPO | Attending: Pulmonary Disease | Admitting: Radiology

## 2011-09-18 VITALS — Ht 72.0 in | Wt 220.0 lb

## 2011-09-18 DIAGNOSIS — G4733 Obstructive sleep apnea (adult) (pediatric): Secondary | ICD-10-CM | POA: Insufficient documentation

## 2011-09-20 ENCOUNTER — Emergency Department (HOSPITAL_COMMUNITY)
Admission: EM | Admit: 2011-09-20 | Discharge: 2011-09-21 | Disposition: A | Discharge status: Other Acute Inpatient Hospital | Payer: BC Managed Care – PPO | Attending: Emergency Medicine | Admitting: Emergency Medicine

## 2011-09-20 ENCOUNTER — Encounter (HOSPITAL_COMMUNITY): Payer: Self-pay

## 2011-09-20 DIAGNOSIS — F419 Anxiety disorder, unspecified: Secondary | ICD-10-CM

## 2011-09-20 DIAGNOSIS — E039 Hypothyroidism, unspecified: Secondary | ICD-10-CM | POA: Insufficient documentation

## 2011-09-20 DIAGNOSIS — R443 Hallucinations, unspecified: Secondary | ICD-10-CM | POA: Insufficient documentation

## 2011-09-20 DIAGNOSIS — F411 Generalized anxiety disorder: Secondary | ICD-10-CM | POA: Insufficient documentation

## 2011-09-20 DIAGNOSIS — R442 Other hallucinations: Secondary | ICD-10-CM

## 2011-09-20 DIAGNOSIS — F17200 Nicotine dependence, unspecified, uncomplicated: Secondary | ICD-10-CM | POA: Insufficient documentation

## 2011-09-20 DIAGNOSIS — F329 Major depressive disorder, single episode, unspecified: Secondary | ICD-10-CM | POA: Insufficient documentation

## 2011-09-20 DIAGNOSIS — F3289 Other specified depressive episodes: Secondary | ICD-10-CM | POA: Insufficient documentation

## 2011-09-20 DIAGNOSIS — Z79899 Other long term (current) drug therapy: Secondary | ICD-10-CM | POA: Insufficient documentation

## 2011-09-20 HISTORY — DX: Post-traumatic stress disorder, unspecified: F43.10

## 2011-09-20 LAB — URINALYSIS, ROUTINE W REFLEX MICROSCOPIC
Bilirubin Urine: NEGATIVE
Glucose, UA: NEGATIVE mg/dL
Ketones, ur: NEGATIVE mg/dL
Leukocytes, UA: NEGATIVE
Nitrite: NEGATIVE
Protein, ur: NEGATIVE mg/dL
Specific Gravity, Urine: 1.017 (ref 1.005–1.030)
Urobilinogen, UA: 0.2 mg/dL (ref 0.0–1.0)
pH: 7.5 (ref 5.0–8.0)

## 2011-09-20 LAB — URINE MICROSCOPIC-ADD ON

## 2011-09-20 LAB — COMPREHENSIVE METABOLIC PANEL
AST: 16 U/L (ref 0–37)
Albumin: 3.5 g/dL (ref 3.5–5.2)
BUN: 11 mg/dL (ref 6–23)
CO2: 24 mEq/L (ref 19–32)
Calcium: 8.8 mg/dL (ref 8.4–10.5)
Creatinine, Ser: 0.88 mg/dL (ref 0.50–1.10)
GFR calc non Af Amer: 80 mL/min — ABNORMAL LOW (ref >90)

## 2011-09-20 LAB — CBC
MCH: 30.7 pg (ref 26.0–34.0)
MCV: 93.8 fL (ref 78.0–100.0)
Platelets: 239 10*3/uL (ref 150–400)
RDW: 13.2 % (ref 11.5–15.5)

## 2011-09-20 LAB — RAPID URINE DRUG SCREEN, HOSP PERFORMED
Amphetamines: NOT DETECTED
Opiates: NOT DETECTED

## 2011-09-20 LAB — ACETAMINOPHEN LEVEL: Acetaminophen (Tylenol), Serum: <15 ug/mL (ref 10–30)

## 2011-09-20 LAB — ETHANOL: Alcohol, Ethyl (B): <11 mg/dL (ref 0–11)

## 2011-09-20 LAB — POCT PREGNANCY, URINE: Preg Test, Ur: NEGATIVE

## 2011-09-20 MED ORDER — LORAZEPAM 1 MG PO TABS
1.0000 mg | ORAL_TABLET | Freq: Three times a day (TID) | ORAL | Status: DC | PRN
  Administered 2011-09-20: 1 mg via ORAL
  Filled 2011-09-20: qty 1
  Filled 2011-09-20: qty 2

## 2011-09-20 MED ORDER — AMITRIPTYLINE HCL 25 MG PO TABS
50.0000 mg | ORAL_TABLET | Freq: Every day | ORAL | Status: DC
  Administered 2011-09-20: 50 mg via ORAL
  Filled 2011-09-20: qty 2

## 2011-09-20 MED ORDER — DIVALPROEX SODIUM ER 500 MG PO TB24
1000.0000 mg | ORAL_TABLET | Freq: Every day | ORAL | Status: DC
  Administered 2011-09-20: 1000 mg via ORAL
  Filled 2011-09-20: qty 2

## 2011-09-20 MED ORDER — BENZTROPINE MESYLATE 1 MG PO TABS
1.0000 mg | ORAL_TABLET | ORAL | Status: DC
  Administered 2011-09-20 (×2): 1 mg via ORAL
  Filled 2011-09-20 (×2): qty 1

## 2011-09-20 MED ORDER — MODAFINIL 200 MG PO TABS
100.0000 mg | ORAL_TABLET | Freq: Every day | ORAL | Status: DC
  Administered 2011-09-20: 100 mg via ORAL

## 2011-09-20 MED ORDER — SERTRALINE HCL 50 MG PO TABS: 150.0000 mg | ORAL_TABLET | Freq: Every day | ORAL | Status: DC

## 2011-09-20 MED ORDER — LORAZEPAM 1 MG PO TABS
2.0000 mg | ORAL_TABLET | Freq: Once | ORAL | Status: AC
  Administered 2011-09-20: 2 mg via ORAL
  Filled 2011-09-20: qty 2

## 2011-09-20 MED ORDER — ONDANSETRON HCL 4 MG PO TABS: 4.0000 mg | ORAL_TABLET | Freq: Three times a day (TID) | ORAL | Status: DC | PRN

## 2011-09-20 MED ORDER — LEVOTHYROXINE SODIUM 50 MCG PO TABS
50.0000 ug | ORAL_TABLET | Freq: Every day | ORAL | Status: DC
  Administered 2011-09-20: 50 ug via ORAL
  Filled 2011-09-20 (×3): qty 1

## 2011-09-20 MED ORDER — IBUPROFEN 600 MG PO TABS: 600.0000 mg | ORAL_TABLET | Freq: Three times a day (TID) | ORAL | Status: DC | PRN

## 2011-09-20 MED ORDER — LORAZEPAM 1 MG PO TABS
2.0000 mg | ORAL_TABLET | Freq: Once | ORAL | Status: AC
  Administered 2011-09-20: 2 mg via ORAL

## 2011-09-20 MED ORDER — LAMOTRIGINE 150 MG PO TABS
150.0000 mg | ORAL_TABLET | Freq: Every day | ORAL | Status: DC
  Administered 2011-09-20: 150 mg via ORAL
  Filled 2011-09-20: qty 1

## 2011-09-20 MED ORDER — ACETAMINOPHEN 325 MG PO TABS: 650.0000 mg | ORAL_TABLET | ORAL | Status: DC | PRN

## 2011-09-20 NOTE — ED Notes (Signed)
Patient reports that she began having hallucinations about 4 weeks ago and she has a feeling that snakes and spiders are crawling on her. Patient states the suicidal ideations started a week ago. Patient denies having a plan. Patient's husband with patient.

## 2011-09-20 NOTE — ED Notes (Addendum)
Pt has been accepted to [facility] by Verne Spurr to Dr. Allena Katz, bed [phone]. EDP notified and is in agreement with disposition. RN made aware to call report. All support paperwork has been completed and faxed to [facility] for review. No further CSW needs identified at this time.   At pt's request, CSW contacted pt's husband to notify him of pt transfer though there was no answer. CSW is currently awaiting a return call.

## 2011-09-20 NOTE — ED Provider Notes (Signed)
History    41 year old female brought in by her husband for psychiatric evaluation. Patient has sensation that snakes and spiders are crawling over her skin. She does not actually see them, but she feels like they are there. Patient has also been increasingly anxious. No increased stressors at home per patient and husband. Patient with a significant psychiatric history with recent evaluations. She reports that some of her medicines were discontinued fairly recently because there was concern that she was being overmedicated. She and her husband reports compliance with all her currently prescribed medications. Patient has auditory hallucinations but she has is at baseline. No recent change in them. Patient denies suicidal ideation but does wish that she didn't have to deal with her psychiatric illness anymore. No fevers or chills. Denies drug abuse. Denies ETOH. Is a smoker.   CSN: [id number]  Arrival date & time 09/20/11  0741   First MD Initiated Contact with Patient 09/20/11 [phone]      Chief Complaint  Patient presents with  . Medical Clearance  . Suicidal    (Consider location/radiation/quality/duration/timing/severity/associated sxs/prior treatment) HPI  Past Medical History  Diagnosis Date  . Depressed   . Seizures   . Heart murmur   . Hypothyroidism   . Anemia   . Headache   . Anxiety   . PTSD (post-traumatic stress disorder)     Past Surgical History  Procedure Date  . Tubal ligation 1996    Family History  Problem Relation Age of Onset  . Adopted: Yes    History  Substance Use Topics  . Smoking status: Current Everyday Smoker -- 1.0 packs/day for 30 years    Types: Cigarettes  . Smokeless tobacco: Never Used  . Alcohol Use: No    OB History    Grav Para Term Preterm Abortions TAB SAB Ect Mult Living                  Review of Systems   Review of symptoms negative unless otherwise noted in HPI.   Allergies  Tomato; Imitrex; and Mustard seed  Home  Medications   Current Outpatient Rx  Name Route Sig Dispense Refill  . AMITRIPTYLINE HCL 50 MG PO TABS Oral Take 1 tablet (50 mg total) by mouth at bedtime. For depression and sleep 30 tablet 0  . DIVALPROEX SODIUM ER 500 MG PO TB24 Oral Take 2 tablets (1,000 mg total) by mouth at bedtime. For mood stability. 60 tablet 0  . LAMOTRIGINE 150 MG PO TABS Oral Take 1 tablet (150 mg total) by mouth at bedtime. For mood stability 30 tablet 0  . LEVOTHYROXINE SODIUM 50 MCG PO TABS Oral Take 1 tablet (50 mcg total) by mouth daily. Thyroid Supplement.    Marland Kitchen NAPROXEN SODIUM 220 MG PO TABS Oral Take 220 mg by mouth 2 (two) times daily as needed. For pain.    Marland Kitchen RISPERIDONE 2 MG PO TABS Oral Take 2 mg by mouth 3 (three) times daily.    . SERTRALINE HCL 100 MG PO TABS Oral Take 100 mg by mouth daily.      BP 90/68  Pulse 93  Temp(Src) 97.3 F (36.3 C) (Oral)  Resp 18  SpO2 97%  Physical Exam  Nursing note and vitals reviewed. Constitutional: She is oriented to person, place, and time. She appears well-developed and well-nourished. No distress.       Anxious appearing. Rubbing arms.  HENT:  Head: Normocephalic and atraumatic.  Eyes: Conjunctivae are normal. Pupils are equal, round,  and reactive to light. Right eye exhibits no discharge. Left eye exhibits no discharge.  Neck: Neck supple.  Cardiovascular: Normal rate, regular rhythm and normal heart sounds.  Exam reveals no gallop and no friction rub.   No murmur heard. Pulmonary/Chest: Effort normal and breath sounds normal. No respiratory distress.  Abdominal: Soft. She exhibits no distension. There is no tenderness.  Musculoskeletal: She exhibits no edema and no tenderness.  Neurological: She is alert and oriented to person, place, and time. No cranial nerve deficit. She exhibits normal muscle tone. Coordination normal.  Skin: Skin is warm and dry. She is not diaphoretic.  Psychiatric: Thought content normal.       Speech clear. Answers  questions appropriately.    ED Course  Procedures (including critical care time)  Labs Reviewed  CBC - Abnormal; Notable for the following:    Hemoglobin 11.9 (*)    All other components within normal limits  COMPREHENSIVE METABOLIC PANEL - Abnormal; Notable for the following:    Glucose, Bld 125 (*)    Total Bilirubin 0.1 (*)    GFR calc non Af Amer 80 (*)    All other components within normal limits  ETHANOL  ACETAMINOPHEN LEVEL  URINE RAPID DRUG SCREEN (HOSP PERFORMED)  URINALYSIS, ROUTINE W REFLEX MICROSCOPIC   No results found.  EKG:  Rhythm: Normal sinus Rate: 92 Axis: Right axis deviation Intervals: Small Q waves inferiorly ST segments: Nonspecific ST changes. T wave inversions laterally and inferiorly    1. Anxiety   2. Tactile hallucination       MDM  41 year old female with increased anxiety and hallucinations. Recent medication changes. Will consult psychiatry for further med recommendations.  ACT consult because feel reasonable chance that pt may need to be placed. Pt voluntary at this time.          Raeford Razor, MD 09/20/11 1044

## 2011-09-20 NOTE — ED Notes (Signed)
Patient is resting comfortably. 

## 2011-09-20 NOTE — BH Assessment (Signed)
Assessment Note   Danielle [name] is an 41 y.o. female. Pt reported to the [facility] with a chief complaint of tactile and auditory hallucinations. Pt presents as preoccupied, depressed and anxious. Pt has a previous mental health hx of PTSD, agoraphobia, panic disorder, depressive disorder and Bipolar Disorder. Pt states that she has a lengthy hx of auditory and visual hallucinations though over the past 2 weeks she has been experiencing tactile hallucinations. Pt states that she has been feeling "snakes and spiders crawling on her and under her skin". Pt states that she also "hears her mother's voice calling her names and telling her to harm herself". Pt states that she has previously went to [facility] for treatment in March and was prescribed new medications. Pt sees Dr. [name] for psychiatry on an outpatient basis who discontinued 2 of the medications (she cannot remember which 2) and has decreased her Zoloft to 100mg  and increasing her Risperidone to 3x daily. Pt also reports increased depression over the last 3-4 weeks with symptoms that include: insomnia (constantly interrupted sleep) fatigue, isolating self from others and loss of interest in usual pleasures. Pt currently denies SI though has thoughts and AH with command. Pt denies HI/substance use. Pt states that she is upset because she feels as though the medications are not working. Pt states she would like to return to [facility] for treatment and was advised to do so by Dr. [name] due to [facility] having changed her medications initially. Pt is currently in need of hospitalization for stabilization.   Pt states [facility] scheduled her an appointment for a sleep study which was completed last night (09/19/11) and has completed the initial screening with an ACT team however has not had her first meeting yet. Pt is unable to recall the name of the ACT team.    Axis I: Psychotic Disorder NOS Axis II: Deferred Axis III:  Past Medical History  Diagnosis Date  . Depressed   .  Seizures   . Heart murmur   . Hypothyroidism   . Anemia   . Headache   . Anxiety   . PTSD (post-traumatic stress disorder)    Axis IV: problems with access to health care services Axis V: 21-30 behavior considerably influenced by delusions or hallucinations OR serious impairment in judgment, communication OR inability to function in almost all areas  Past Medical History:  Past Medical History  Diagnosis Date  . Depressed   . Seizures   . Heart murmur   . Hypothyroidism   . Anemia   . Headache   . Anxiety   . PTSD (post-traumatic stress disorder)     Past Surgical History  Procedure Date  . Tubal ligation 1996    Family History:  Family History  Problem Relation Age of Onset  . Adopted: Yes    Social History:  reports that she has been smoking Cigarettes.  She has a 30 pack-year smoking history. She has never used smokeless tobacco. She reports that she does not drink alcohol or use illicit drugs.  Additional Social History:    Allergies:  Allergies  Allergen Reactions  . Tomato Rash    Pt reports that she breaks out in a rash if she eats tomato based foods but can eat a whole tomato  . Imitrex (Sumatriptan Base) Hives  . Mustard Seed Rash    Also allergic to [facility] Medications:  (Not in a hospital admission)  OB/GYN Status:  No LMP recorded. Patient is postmenopausal.  General Assessment Data Location of Assessment: [facility] ED Living Arrangements: Spouse/significant other;Children;Non-relatives/Friends Can pt return to current living arrangement?: Yes Admission Status: Voluntary Is patient capable of signing voluntary admission?: Yes Transfer from: Acute Hospital Referral Source: Self/Family/Friend  Education Status Is patient currently in school?: No  Risk to self Suicidal Ideation: Yes-Currently Present Suicidal Intent: No-Not Currently/Within Last 6 Months Is patient at risk for suicide?: Yes Suicidal Plan?: No-Not Currently/Within Last 6  Months Specify Current Suicidal Plan: no plan- auditory hallucinations with command Access to Means: No What has been your use of drugs/alcohol within the last 12 months?: pt denies Previous Attempts/Gestures: Yes How many times?: 2  Other Self Harm Risks: 2 SI attempts were trying to drown self & cut wrists Triggers for Past Attempts: Hallucinations Intentional Self Injurious Behavior: None Family Suicide History: Unknown Recent stressful life event(s): Other (Comment) (continued hallucinations) Persecutory voices/beliefs?: Yes Depression: Yes Depression Symptoms: Insomnia;Tearfulness;Isolating;Feeling angry/irritable;Loss of interest in usual pleasures;Fatigue Substance abuse history and/or treatment for substance abuse?: No Suicide prevention information given to non-admitted patients: Not applicable  Risk to Others Homicidal Ideation: No Thoughts of Harm to Others: No Current Homicidal Intent: No Current Homicidal Plan: No Access to Homicidal Means: No Identified Victim: none History of harm to others?: No Assessment of Violence: None Noted Violent Behavior Description: pt is currently calm and cooperative Does patient have access to weapons?: No Criminal Charges Pending?: No Does patient have a court date: No  Psychosis Hallucinations: Tactile;Auditory;With command ("feels snakes and spiders crawling"; voices tell harm self) Delusions: None noted  Mental Status Report Appear/Hygiene: Disheveled Eye Contact: Good Motor Activity: Restlessness Speech: Logical/coherent Level of Consciousness: Restless;Alert Mood: Depressed;Preoccupied;Anxious Affect: Anxious;Depressed;Preoccupied Anxiety Level: Minimal Thought Processes: Coherent;Relevant Judgement: Impaired Orientation: Person;Place;Time;Situation Obsessive Compulsive Thoughts/Behaviors: Minimal  Cognitive Functioning Concentration: Decreased Memory: Recent Intact;Remote Intact IQ: Average Insight: Fair Impulse  Control: Poor Appetite: Good Weight Loss: 0  Weight Gain: 0  Sleep: Decreased Total Hours of Sleep: 7  (interrupted sleep) Vegetative Symptoms: None  Prior Inpatient Therapy Prior Inpatient Therapy: Yes Prior Therapy Dates: Multiple admits over the past 5-6 yrs Prior Therapy Facilty/Provider(s): [name] Reason for Treatment: psychosis  Prior Outpatient Therapy Prior Outpatient Therapy: Yes Prior Therapy Dates: 2012-present Prior Therapy Facilty/Provider(s): [facility] Reason for Treatment: psychosis            Values / Beliefs Cultural Requests During Hospitalization: None Spiritual Requests During Hospitalization: None        Additional Information 1:1 In Past 12 Months?: No CIRT Risk: No Elopement Risk: No Does patient have medical clearance?: Yes     Disposition:  Disposition Disposition of Patient: Inpatient treatment program Type of inpatient treatment program: Adult Patient referred to: Other (Comment) [facility])  On Site Evaluation by:   Reviewed with Physician:     Nevada Crane F 09/20/2011 6:42 PM

## 2011-09-20 NOTE — ED Notes (Signed)
Patient is resting comfortably and knows that a urine sample is needed.

## 2011-09-20 NOTE — ED Notes (Signed)
Patient's husband took meds and belongings bag x 1 to the car prior to patient moving to room 36-Psych ED

## 2011-09-20 NOTE — ED Notes (Signed)
Seeing LSW, SW stated patient was rubbing her body trying to keep the snakes and spiders off her body, call to MD

## 2011-09-20 NOTE — ED Notes (Signed)
Patient comes up to NS c/o feeling creepy crawly over her body, rubbing her skin/body

## 2011-09-20 NOTE — ED Notes (Addendum)
Was placed on 3 new meds last admission but meds made her feel snakes and spiders crawling on her, this lasted for about 4 weeks, when she returned to MD appointment 2 of these new meds were discontinued and the third was decreased to once a day instead of 3x/day, not taking these meds has made her SI . States she hears voices that tell her she is no good, she is stupid etc.  She is here to change meds and evaluation.

## 2011-09-20 NOTE — ED Notes (Signed)
Attempted to assess pt, but unable to awaken. Pt sleeping very soundly, snoring loudly. Will return at later time for assessment.

## 2011-09-21 ENCOUNTER — Inpatient Hospital Stay (HOSPITAL_COMMUNITY)
Admission: AD | Admit: 2011-09-21 | Discharge: 2011-09-30 | DRG: 430 | Disposition: A | Discharge status: Home / Self Care | Payer: BC Managed Care – PPO | Source: Ambulatory Visit | Attending: Psychiatry | Admitting: Psychiatry

## 2011-09-21 DIAGNOSIS — G4733 Obstructive sleep apnea (adult) (pediatric): Secondary | ICD-10-CM

## 2011-09-21 DIAGNOSIS — E039 Hypothyroidism, unspecified: Secondary | ICD-10-CM | POA: Diagnosis present

## 2011-09-21 DIAGNOSIS — R42 Dizziness and giddiness: Secondary | ICD-10-CM | POA: Diagnosis present

## 2011-09-21 DIAGNOSIS — F200 Paranoid schizophrenia: Secondary | ICD-10-CM | POA: Diagnosis present

## 2011-09-21 DIAGNOSIS — F4310 Post-traumatic stress disorder, unspecified: Secondary | ICD-10-CM | POA: Diagnosis present

## 2011-09-21 DIAGNOSIS — F332 Major depressive disorder, recurrent severe without psychotic features: Principal | ICD-10-CM | POA: Diagnosis present

## 2011-09-21 DIAGNOSIS — F4001 Agoraphobia with panic disorder: Secondary | ICD-10-CM | POA: Diagnosis present

## 2011-09-21 DIAGNOSIS — F339 Major depressive disorder, recurrent, unspecified: Secondary | ICD-10-CM

## 2011-09-21 DIAGNOSIS — F411 Generalized anxiety disorder: Secondary | ICD-10-CM | POA: Diagnosis present

## 2011-09-21 DIAGNOSIS — Z79899 Other long term (current) drug therapy: Secondary | ICD-10-CM

## 2011-09-21 DIAGNOSIS — F329 Major depressive disorder, single episode, unspecified: Secondary | ICD-10-CM

## 2011-09-21 DIAGNOSIS — G479 Sleep disorder, unspecified: Secondary | ICD-10-CM | POA: Diagnosis present

## 2011-09-21 DIAGNOSIS — F17200 Nicotine dependence, unspecified, uncomplicated: Secondary | ICD-10-CM | POA: Diagnosis present

## 2011-09-21 MED ORDER — IBUPROFEN 600 MG PO TABS
600.0000 mg | ORAL_TABLET | Freq: Three times a day (TID) | ORAL | Status: DC | PRN
  Administered 2011-09-23 – 2011-09-24 (×2): 600 mg via ORAL
  Filled 2011-09-21 (×2): qty 1

## 2011-09-21 MED ORDER — SERTRALINE HCL 50 MG PO TABS
150.0000 mg | ORAL_TABLET | Freq: Every day | ORAL | Status: DC
  Administered 2011-09-21 – 2011-09-30 (×10): 150 mg via ORAL
  Filled 2011-09-21 (×11): qty 1

## 2011-09-21 MED ORDER — BENZTROPINE MESYLATE 1 MG PO TABS
1.0000 mg | ORAL_TABLET | ORAL | Status: DC
  Administered 2011-09-21: 1 mg via ORAL
  Filled 2011-09-21 (×5): qty 1

## 2011-09-21 MED ORDER — AMITRIPTYLINE HCL 50 MG PO TABS
50.0000 mg | ORAL_TABLET | Freq: Every day | ORAL | Status: DC
  Administered 2011-09-21 – 2011-09-23 (×3): 50 mg via ORAL
  Filled 2011-09-21 (×5): qty 1

## 2011-09-21 MED ORDER — DIVALPROEX SODIUM ER 500 MG PO TB24
1000.0000 mg | ORAL_TABLET | Freq: Every day | ORAL | Status: DC
  Administered 2011-09-21 – 2011-09-29 (×9): 1000 mg via ORAL
  Filled 2011-09-21 (×11): qty 2

## 2011-09-21 MED ORDER — RISPERIDONE 1 MG PO TABS
1.0000 mg | ORAL_TABLET | Freq: Every day | ORAL | Status: DC
  Administered 2011-09-21 – 2011-09-22 (×2): 1 mg via ORAL
  Filled 2011-09-21 (×5): qty 1

## 2011-09-21 MED ORDER — LAMOTRIGINE 100 MG PO TABS
150.0000 mg | ORAL_TABLET | Freq: Every day | ORAL | Status: DC
  Administered 2011-09-21 – 2011-09-29 (×9): 150 mg via ORAL
  Filled 2011-09-21 (×10): qty 1

## 2011-09-21 MED ORDER — LEVOTHYROXINE SODIUM 50 MCG PO TABS
50.0000 ug | ORAL_TABLET | Freq: Every day | ORAL | Status: DC
  Administered 2011-09-21 – 2011-09-30 (×10): 50 ug via ORAL
  Filled 2011-09-21 (×12): qty 1

## 2011-09-21 MED ORDER — ACETAMINOPHEN 325 MG PO TABS: 650.0000 mg | ORAL_TABLET | Freq: Four times a day (QID) | ORAL | Status: DC | PRN

## 2011-09-21 MED ORDER — BENZTROPINE MESYLATE 0.5 MG PO TABS
0.5000 mg | ORAL_TABLET | Freq: Two times a day (BID) | ORAL | Status: DC
  Administered 2011-09-21 – 2011-09-22 (×3): 0.5 mg via ORAL
  Filled 2011-09-21 (×8): qty 1

## 2011-09-21 MED ORDER — HYDROXYZINE HCL 25 MG PO TABS
25.0000 mg | ORAL_TABLET | ORAL | Status: DC | PRN
  Administered 2011-09-22 – 2011-09-24 (×4): 25 mg via ORAL
  Filled 2011-09-21 (×3): qty 1

## 2011-09-21 MED ORDER — ALUM & MAG HYDROXIDE-SIMETH 200-200-20 MG/5ML PO SUSP: 30.0000 mL | ORAL | Status: DC | PRN

## 2011-09-21 MED ORDER — LORAZEPAM 1 MG PO TABS
1.0000 mg | ORAL_TABLET | Freq: Three times a day (TID) | ORAL | Status: DC | PRN
  Administered 2011-09-21 – 2011-09-22 (×4): 1 mg via ORAL
  Filled 2011-09-21 (×4): qty 1

## 2011-09-21 MED ORDER — MAGNESIUM HYDROXIDE 400 MG/5ML PO SUSP: 30.0000 mL | Freq: Every day | ORAL | Status: DC | PRN

## 2011-09-21 MED ORDER — ACETAMINOPHEN 325 MG PO TABS: 650.0000 mg | ORAL_TABLET | ORAL | Status: DC | PRN

## 2011-09-21 MED ORDER — NICOTINE 21 MG/24HR TD PT24
21.0000 mg | MEDICATED_PATCH | Freq: Every day | TRANSDERMAL | Status: DC
  Administered 2011-09-21 – 2011-09-30 (×10): 21 mg via TRANSDERMAL
  Filled 2011-09-21 (×11): qty 1

## 2011-09-21 NOTE — H&P (Signed)
  Pt was seen by me today and I agree with the key elements documented in H&P.  

## 2011-09-21 NOTE — Progress Notes (Signed)
Patient ID: Danielle [name], female   DOB: January 01, 1971, 41 y.o.   MRN: [id number] This is a 41 y.o. M/W/F vol. admission with a Dx of Schizophrenia, paranoid type. H/O Bipolar D/O, PTSD. The patient presented to the ED with a c/o feeling snakes and spiders crawling on her body and through her veins. States that she has auditory hallucination. She hears the voice of her mother calling her name. Denies being suicidal at present, but admits to thoughts of killing herself to stop the tactile and auditory hallucinations. Past h/o a suicide attempt by cutting her wrist. Medical history includes a heart murmer, anemia and a seizure disorder. Last seizure was about two weeks ago. She was medicated prior to admission to [facility] and was having a hard time staying awake to answer questions. Her speech was somewhat slurred.

## 2011-09-21 NOTE — [facility] Suicide Risk Assessment (Signed)
Suicide Risk Assessment  Admission Assessment     Demographic factors:  Assessment Details Time of Assessment: Admission Information Obtained From: Patient Current Mental Status:  Current Mental Status: Suicidal ideation indicated by patient. See below MSE Loss Factors:  Loss Factors: Financial problems / change in socioeconomic status Historical Factors:  Historical Factors: Prior suicide attempts Risk Reduction Factors:  Risk Reduction Factors: Responsible for children under 73 years of age;Living with another person, especially a relative;Positive therapeutic relationship  CLINICAL FACTORS:   Currently Psychotic  COGNITIVE FEATURES THAT CONTRIBUTE TO RISK:  Loss of executive function    SUICIDE RISK:   Moderate:  Frequent suicidal ideation with limited intensity, and duration, some specificity in terms of plans, no associated intent, good self-control, limited dysphoria/symptomatology, some risk factors present, and identifiable protective factors, including available and accessible social support.  PLAN OF CARE:   Mental Status Examination/Evaluation:  Objective: Appearance: Neat   Psychomotor Activity: Increased   Eye Contact:: Fair   Speech: slow  Volume: Normal   Mood: Depressed   Affect: Congruent   Thought Process: somewhat clear rational goal oriented   Orientation: Full   Thought Content: Hallucinations-tactile  Suicidal Thoughts: No   Homicidal Thoughts: No   Judgement: Poor   Insight: Shallow   DIAGNOSIS:  AXIS I  Schizophrenia paranoid  AXIS II  Deferred   AXIS III  See medical history.   AXIS IV  problems with access to health care services didn't get ACT started due to husband's cell being disconnected   AXIS V  21-30 behavior considerably influenced by delusions or hallucinations OR serious impairment in judgment, communication OR inability to function in almost all areas    Treatment Plan Summary:  Admit for safety and stabilization  Adjust meds as  indicated  Case manager to help with confirmation of ACT services post discharge  Primary team to consider transfer to [facility] for long term treatment   Wonda Cerise 09/21/2011, 7:53 PM

## 2011-09-21 NOTE — Progress Notes (Signed)
[facility] Adult Inpatient Family/Significant Other Suicide Prevention Education  Suicide Prevention Education:  Education Completed; Joan Herschberger [phone] (Husband),  (name of family member/significant other) has been identified by the patient as the family member/significant other with whom the patient will be residing, and identified as the person(s) who will aid the patient in the event of a mental health crisis (suicidal ideations/suicide attempt).  With written consent from the patient, the family member/significant other has been provided the following suicide prevention education, prior to the and/or following the discharge of the patient.  The suicide prevention education provided includes the following:  Suicide risk factors  Suicide prevention and interventions  National Suicide Hotline telephone number  [facility] assessment telephone number  [facility] Emergency Assistance 911  [facility] and/or Residential Mobile Crisis Unit telephone number  Request made of family/significant other to:  Remove weapons (e.g., guns, rifles, knives), all items previously/currently identified as safety concern.    Remove drugs/medications (over-the-counter, prescriptions, illicit drugs), all items previously/currently identified as a safety concern.  The family member/significant other verbalizes understanding of the suicide prevention education information provided.  The family member/significant other agrees to remove the items of safety concern listed above.  Pt had a sleep study last week and husband is hopeful she can get a C Pap machine soon to help. He also feels that her medications are not working right now and he thinks it might be the Zoloft that is "Not right".   Pt. accepted information on suicide prevention, warning signs to look for with suicide and crisis line numbers to use. The pt. agreed to call crisis line numbers if having warning signs or having  thoughts of suicide.    [facility] 09/21/2011, 4:12 PM

## 2011-09-21 NOTE — Progress Notes (Signed)
Patient ID: GISELL BUEHRLE, female   DOB: 1970/11/10, 41 y.o.   MRN: [id number]  [facility] Group Notes:  (Counselor/Nursing/MHT/Case Management/Adjunct)  09/21/2011 11 AM  Type of Therapy:  Aftercare Planning, Group Therapy, Dance/Movement Therapy   Participation Level:  Did Not Attend   Rhunette Croft

## 2011-09-21 NOTE — H&P (Signed)
Psychiatric Admission Assessment Adult  Patient Identification:  Danielle [name] Date of Evaluation:  09/21/2011 40yo MWF CC: new onset tactile hallucinations snakes and spiders crawling on her.  History of Present Illness: This is 3rd admission in less than 60 days. Last here 4/10-4/15/13 for being too sedated. Meds were adjusted and with in days of discharge she reports tactile hallucinations.Her outside psychiatrist Dr.Neill Durwin Nora wanted her to get her sleep apnea study before being readmitted. She had the study Thursday night and presented at ED after the study.   Past Psychiatric History: Please see prior admissions for complete details    Substance Abuse History:  Social History:    reports that she has been smoking Cigarettes.  She has a 30 pack-year smoking history. She has never used smokeless tobacco. She reports that she does not drink alcohol or use illicit drugs.  Family Psych History: See old chart   Past Medical History:     Past Medical History  Diagnosis Date  . Depressed   . Seizures   . Heart murmur   . Hypothyroidism   . Anemia   . Headache   . Anxiety   . PTSD (post-traumatic stress disorder)        Past Surgical History  Procedure Date  . Tubal ligation 1996    Allergies:  Allergies  Allergen Reactions  . Tomato Rash    Pt reports that she breaks out in a rash if she eats tomato based foods but can eat a whole tomato  . Imitrex (Sumatriptan Base) Hives  . Mustard Seed Rash    Also allergic to [facility]    Current Medications:  Prior to Admission medications   Medication Sig Start Date End Date Taking? Authorizing Provider  amitriptyline (ELAVIL) 50 MG tablet Take 1 tablet (50 mg total) by mouth at bedtime. For depression and sleep 08/19/11 08/18/12  Viviann Spare, FNP  divalproex (DEPAKOTE ER) 500 MG 24 hr tablet Take 2 tablets (1,000 mg total) by mouth at bedtime. For mood stability. 08/19/11 08/18/12  Viviann Spare, FNP  lamoTRIgine  (LAMICTAL) 150 MG tablet Take 1 tablet (150 mg total) by mouth at bedtime. For mood stability 08/19/11 08/18/12  Viviann Spare, FNP  levothyroxine (SYNTHROID, LEVOTHROID) 50 MCG tablet Take 1 tablet (50 mcg total) by mouth daily. Thyroid Supplement. 08/09/11   Viviann Spare, FNP  naproxen sodium (ANAPROX) 220 MG tablet Take 220 mg by mouth 2 (two) times daily as needed. For pain.    Historical Provider, MD  risperiDONE (RISPERDAL) 2 MG tablet Take 2 mg by mouth 3 (three) times daily.    Historical Provider, MD  sertraline (ZOLOFT) 100 MG tablet Take 100 mg by mouth daily.    Historical Provider, MD    Mental Status Examination/Evaluation: Objective:  Appearance: Neat  Psychomotor Activity:  Increased  Eye Contact::  Fair  Speech:  Normal Rate  Volume:  Normal  Mood:  Depressed   Affect:  Congruent  Thought Process: somewhat clear rational goal oriented    Orientation:  Full  Thought Content:  Hallucinations-tactile are new   Suicidal Thoughts:  No  Homicidal Thoughts:  No  Judgement:  Poor  Insight:  Shallow    DIAGNOSIS:    AXIS I MDD and Schizophrenia paranoid   AXIS II Deferred  AXIS III See medical history.  AXIS IV problems with access to health care services didn't get ACT started due to husband's cell being disconnected   AXIS V 21-30 behavior  considerably influenced by delusions or hallucinations OR serious impairment in judgment, communication OR inability to function in almost all areas     Treatment Plan Summary: Admit for safety and stabilization  Adjust meds as indicated  Case manager to help with confirmation of ACT services post discharge

## 2011-09-21 NOTE — Tx Team (Addendum)
Initial Interdisciplinary Treatment Plan  PATIENT STRENGTHS: (choose at least two) Average or above average intelligence Financial means General fund of knowledge  PATIENT STRESSORS: Medication change or noncompliance   PROBLEM LIST: Problem List/Patient Goals Date to be addressed Date deferred Reason deferred Estimated date of resolution  Tactile Hallucinations 09/20/11                                                      DISCHARGE CRITERIA:  Improved stabilization in mood, thinking, and/or behavior  PRELIMINARY DISCHARGE PLAN: Outpatient therapy Participate in family therapy  PATIENT/FAMIILY INVOLVEMENT: This treatment plan has been presented to and reviewed with the patient, Danielle [name]  Danielle [name] 09/21/2011, 2:30 AM

## 2011-09-21 NOTE — Progress Notes (Signed)
Pt is depressed and anxious  She has an unsteady gait and reported she slid off the toilet this mornign on to the floor  Examined pt but did not see even a reddened area where she said she sat down hard on the floor   She continues to say that spiders and snakes are crawling on her and said everyone was laughing at her and telling her it wasn't true   She has not attended group   Verbal support given  Medications administered and effectiveness monitored  Q 15 min checks  Verbal reassurance given of staff concern and reinforced safety issues and fall precautions  Pt safe at present

## 2011-09-22 LAB — COMPREHENSIVE METABOLIC PANEL
AST: 12 U/L (ref 0–37)
Albumin: 3.5 g/dL (ref 3.5–5.2)
Calcium: 9.2 mg/dL (ref 8.4–10.5)
Chloride: 99 mEq/L (ref 96–112)
Creatinine, Ser: 0.87 mg/dL (ref 0.50–1.10)
Sodium: 134 mEq/L — ABNORMAL LOW (ref 135–145)

## 2011-09-22 LAB — LIPID PANEL: Total CHOL/HDL Ratio: 10.7 RATIO

## 2011-09-22 MED ORDER — LORAZEPAM 1 MG PO TABS
1.0000 mg | ORAL_TABLET | Freq: Three times a day (TID) | ORAL | Status: DC
  Administered 2011-09-22 – 2011-09-23 (×2): 1 mg via ORAL
  Filled 2011-09-22 (×2): qty 1

## 2011-09-22 MED ORDER — BENZTROPINE MESYLATE 1 MG PO TABS
1.0000 mg | ORAL_TABLET | Freq: Two times a day (BID) | ORAL | Status: DC
  Administered 2011-09-23 (×2): 1 mg via ORAL
  Filled 2011-09-22 (×5): qty 1

## 2011-09-22 NOTE — Progress Notes (Signed)
Pt continues to use a wheel chair due to dizziness and unsteady gait   She is very anxious and continues to say bugs and snakes are crawling on her  She has gone to the cafeteria for meals  But does not attend groups   She interacts minimally with others   Verbal support given  Medications aadministered and effectiveness monitored  Q 15 min checks  Pt safe at present

## 2011-09-22 NOTE — Progress Notes (Signed)
Patient ID: Danielle [name], female   DOB: [birth year], 41 y.o.   MRN: [id number] The patient is depressed and anxious. She was having a hard time sitting in the dayroom because she was experiencing what she believed to be snakes and spiders crawling through her body. Was medicated for anxiety. Within a half hour was visibly calmer and was able to tolerate sitting in the dayroom. Minimal participation in the milieu. Remains depressed, hopeless and helpless. Denied any suicidal ideation and contracted for safety.

## 2011-09-22 NOTE — Progress Notes (Signed)
[facility] MD Progress Note                                         09/22/2011    Danielle [name] 1971/03/16    [id number] Hospital Day #1   Dx: AXIS I: Schizophrenia, Paranoid Type; Major Depressive disorder  The patient was seen today and reports the following:  Sleep:  Appetite:   Mild>(1-10) >Severe  Hopelessness (1-10): Depression (1-10):  Anxiety (1-10):  Suicidal Ideation: .  Plan: None Intent: None Means:  None Homicidal Ideation:  Plan: None Intent: None Means: None  Eye Contact: Good.  General Appearance/Behavior:  Motor Behavior:  Speech:  Mental Status:  Level of Consciousness:   Mood:  Affect:  Thought Process: Thought Content:  Perception:  Judgment: Insight: Cognition:  Sleep: Number of Hours:   Filed Vitals:   09/22/11 0855  BP: 99/70  Pulse: 88  Temp:   Resp:        . amitriptyline  50 mg Oral QHS  . benztropine  0.5 mg Oral BID  . divalproex  1,000 mg Oral QHS  . lamoTRIgine  150 mg Oral QHS  . levothyroxine  50 mcg Oral Daily  . nicotine  21 mg Transdermal Daily  . risperiDONE  1 mg Oral QHS  . sertraline  150 mg Oral Q breakfast  . DISCONTD: benztropine  1 mg Oral BH-qamhs    Results for orders placed during the hospital encounter of 09/20/11 (from the past 48 hour(s))  URINE RAPID DRUG SCREEN (HOSP PERFORMED)     Status: Normal   Collection Time   09/20/11  2:49 PM      Component Value Range Comment   Opiates NONE DETECTED  NONE DETECTED     Cocaine NONE DETECTED  NONE DETECTED     Benzodiazepines NONE DETECTED  NONE DETECTED     Amphetamines NONE DETECTED  NONE DETECTED     Tetrahydrocannabinol NONE DETECTED  NONE DETECTED     Barbiturates NONE DETECTED  NONE DETECTED    URINALYSIS, ROUTINE W REFLEX MICROSCOPIC     Status: Abnormal   Collection Time   09/20/11  2:49 PM      Component Value Range Comment   Color, Urine YELLOW  YELLOW     APPearance CLEAR  CLEAR     Specific Gravity, Urine 1.017  1.005 - 1.030     pH  7.5  5.0 - 8.0     Glucose, UA NEGATIVE  NEGATIVE (mg/dL)    Hgb urine dipstick SMALL (*) NEGATIVE     Bilirubin Urine NEGATIVE  NEGATIVE     Ketones, ur NEGATIVE  NEGATIVE (mg/dL)    Protein, ur NEGATIVE  NEGATIVE (mg/dL)    Urobilinogen, UA 0.2  0.0 - 1.0 (mg/dL)    Nitrite NEGATIVE  NEGATIVE     Leukocytes, UA NEGATIVE  NEGATIVE    URINE MICROSCOPIC-ADD ON     Status: Abnormal   Collection Time   09/20/11  2:49 PM      Component Value Range Comment   Squamous Epithelial / LPF FEW (*) RARE     RBC / HPF 7-10  <3 (RBC/hpf)    Bacteria, UA FEW (*) RARE     Urine-Other MUCOUS PRESENT     POCT PREGNANCY, URINE     Status: Normal   Collection Time   09/20/11  3:23 PM  Component Value Range Comment   Preg Test, Ur NEGATIVE  NEGATIVE    VALPROIC ACID LEVEL     Status: Normal   Collection Time   09/20/11 11:20 PM      Component Value Range Comment   Valproic Acid Lvl 68.6  50.0 - 100.0 (ug/mL)     No results found for this or any previous visit (from the past 48 hour(s)).  Brief time was spent with the patient as she was asleep. She states she wants help with her problem. She is trembling all over, states she feels snakes and spiders crawling over her skin.  She states this has been going on for two weeks now.  It started before she had medication changes.    ROS:    Constitutional: WDWN AAF NAD   GI: Negative for N,V,D,C   Neuro: Negative for dizziness, blurred vision, visual changes, headaches   Resp: Negative for wheezing, SOB, cough   Cardio: Negative for CP, diaphoresis, fatigue   MSK: Negative for joint pain, swelling, DROM, or ambulatory difficulties.    Treatment Plan Summary:  1. Daily contact with patient to assess and evaluate symptoms and progress in treatment.  2. Medication management  3. The patient will deny suicidal ideations or homicidal ideations for 48 hours prior to discharge and have a depression and anxiety rating of 3 or less. The patient will also  deny any auditory or visual hallucinations or delusional thinking.  4. The patient will deny any symptoms of substance withdrawal at time of discharge.   Treatment Plan: 1. Increase Cogentin to 1mg  BID. 2. Follow up thyroid studies. 3. Restart CPap 4. Return patient to previously scheduled medications as noted in orders.

## 2011-09-23 DIAGNOSIS — F411 Generalized anxiety disorder: Secondary | ICD-10-CM | POA: Diagnosis present

## 2011-09-23 LAB — HEMOGLOBIN A1C
Hgb A1c MFr Bld: 5.6 % (ref <5.7)
Mean Plasma Glucose: 114 mg/dL (ref <117)

## 2011-09-23 LAB — T4, FREE: Free T4: 0.87 ng/dL (ref 0.80–1.80)

## 2011-09-23 MED ORDER — MODAFINIL 200 MG PO TABS
200.0000 mg | ORAL_TABLET | Freq: Every day | ORAL | Status: DC
  Administered 2011-09-24 – 2011-09-30 (×7): 200 mg via ORAL
  Filled 2011-09-23: qty 1
  Filled 2011-09-23: qty 3
  Filled 2011-09-23 (×5): qty 1

## 2011-09-23 MED ORDER — LORAZEPAM 1 MG PO TABS
1.0000 mg | ORAL_TABLET | ORAL | Status: DC
  Administered 2011-09-23: 1 mg via ORAL
  Filled 2011-09-23: qty 1

## 2011-09-23 MED ORDER — BENZTROPINE MESYLATE 1 MG PO TABS
1.0000 mg | ORAL_TABLET | ORAL | Status: DC
  Administered 2011-09-24: 1 mg via ORAL
  Filled 2011-09-23 (×5): qty 1

## 2011-09-23 MED ORDER — BENZTROPINE MESYLATE 1 MG PO TABS
1.0000 mg | ORAL_TABLET | ORAL | Status: AC
  Administered 2011-09-23: 1 mg via ORAL
  Filled 2011-09-23: qty 1

## 2011-09-23 MED ORDER — RISPERIDONE 2 MG PO TABS
2.0000 mg | ORAL_TABLET | ORAL | Status: DC
  Administered 2011-09-24 – 2011-09-25 (×3): 2 mg via ORAL
  Filled 2011-09-23 (×7): qty 1

## 2011-09-23 NOTE — Progress Notes (Signed)
[facility] Group Notes:  (Counselor/Nursing/MHT/Case Management/Adjunct)  09/23/2011 2:41 PM  Type of Therapy:  Group Therapy  Participation Level:  Did Not Attend    Veto Kemps 09/23/2011, 2:41 PM

## 2011-09-23 NOTE — Tx Team (Signed)
Interdisciplinary Treatment Plan Update (Adult)  Date:  09/23/2011  Time Reviewed:  10:15AM-11:15AM  Progress in Treatment: Attending groups:  No Participating in groups:  N/A   Taking medication as prescribed:    Yes Tolerating medication:   No, has tactile hallucinations vs itching Family/Significant other contact made:  Yes, with husband Patient understands diagnosis:   Yes, poor insight, poor judgment Discussing patient identified problems/goals with staff:   Yes Medical problems stabilized or resolved:   New Denies suicidal/homicidal ideation:  Yes Issues/concerns per patient self-inventory:   None Other:    New problem(s) identified: No, Describe:    Reason for Continuation of Hospitalization: Anxiety Depression Hallucinations Medication stabilization Suicidal ideation Other; describe hopelessness  Interventions implemented related to continuation of hospitalization:  Medication monitoring and adjustment, safety checks Q15 min., suicide risk assessment, group therapy, psychoeducation, collateral contact, aftercare planning, ongoing physician assessments, medication education  Additional comments:  Not applicable  Estimated length of stay:  5-7 days  Discharge Plan:  Return to home with husband and friends, follow up either with Dr. [name] at [facility] or with [facility] where she was referred during last hospitalization.  New goal(s):  Not applicable  Review of initial/current patient goals per problem list:   1.  Goal(s):  Improve sleep and appetite to normal pattern.  Met:  No  Target date:  By Discharge   As evidenced by:  States she is getting no sleep, has no appetite  2.  Goal(s):  Reduce depression from 5/20 level of "8" to no greater than 3 at discharge.  Met:  No  Target date:  By Discharge   As evidenced by:  "8" today  3.  Goal(s):  Reduce anxiety from 5/20 level of "8" to no greater than 3 at discharge.  Met:  No  Target date:  By  Discharge   As evidenced by:  "8" today  4.  Goal(s):  Reduce hopelessness from 5/20 level of "8" to no greater than 3 at discharge.  Met:  No  Target date:  By Discharge   As evidenced by:  "8" today  5.  Goal(s):  Reduce hallucinations, including tactile and auditory, to baseline per patient and husband.  Met:  No  Target date:  By Discharge   As evidenced by: actively experiencing both tactile and auditory hallucinations presently  6.  Goal(s):  Medication stabilization  Met:  No  Target date:  By Discharge   As evidenced by:  New, still ongoing  7.  Goal(s):  Follow up with [facility] to see how long before they start with patient.  Met:  No  Target date:  By Discharge   As evidenced by: Still needed   Attendees: Patient:  Danielle [name]  09/23/2011 10:15AM-11:15AM  Family:     Physician:  Dr. Harvie Heck Readling 09/23/2011 10:15AM-11:15AM  Nursing:   Tacy Learn, RN 09/23/2011 10:15AM -11:15AM   Case Manager:  Ambrose Mantle, LCSW 09/23/2011 10:15AM-11:15AM  Counselor:  Veto Kemps, MT-BC 09/23/2011 10:15AM-11:15AM  Other:   Verne Spurr, PA 09/23/2011 10:15AM-11:15AM  Other:      Other:      Other:       Scribe for Treatment Team:   Sarina Ser, 09/23/2011, 10:15AM-11:15AM

## 2011-09-23 NOTE — Progress Notes (Signed)
Patient ID: Danielle [name], female   DOB: 1970-12-31, 41 y.o.   MRN: [id number] The patient is still experiencing the tactile hallucinations. She stated that she knew it wasn't real because she looked in the mirror and could see there was nothing on her, yet she could feel snakes and spiders crawling on her. She is very anxious and fearful. Medicated with Ativan and looked visibly calmer shortly afterwards and was able to attend the evening wrap up group. Denied any A/V hallucinations. Denied any suicidal ideations. C-pap applied at HS.

## 2011-09-23 NOTE — Progress Notes (Signed)
[facility] MD Progress Note  09/23/2011 8:12 AM  Diagnosis:  Axis I: Schizophrenia - Paranoid Type.  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  Posttraumatic Stress Disorder.  Panic Disorder with Agoraphobia.   The patient was seen today and reports the following:   ADL's: Intact.  Sleep: The patient reports to having significant difficulty initiating and maintaining sleep.  Appetite: The patient reports a decreased appetite today.   Mild>(1-10) >Severe  Hopelessness (1-10): 8  Depression (1-10): 8  Anxiety (1-10): 8   Suicidal Ideation: The patient denies any suicidal ideations today.  Plan: No  Intent: No  Means: No   Homicidal Ideation: The patient denies any homicidal ideations today.  Plan: No  Intent: No.  Means: No   General Appearance/Behavior: Appropriate and cooperative today but appears significantly depressed and complaining of itching.  Eye Contact: Fair.  Speech: Appropriate in rate and volume today with no pressuring noted.  Motor Behavior: wnl.  Level of Consciousness: Alert and Oriented x 3.  Mental Status: Alert and Oriented x 3.  Mood: Severely depressed.  Affect: Moderate to Severely constricted.  Anxiety Level: Severe anxiety reported.  Thought Process: Reports auditory hallucinations today.  Thought Content: The patient reports auditory hallucinations instructing her to harm herself. They also tell her she is "useless and need to die." She denies any visual hallucinations today as any delusional thinking.  Perception:. Auditory hallucinations reported.  Judgment: Fair.  Insight: Fair.  Cognition: Oriented to person, place and time.  Sleep:  Number of Hours: 6    Vital Signs:Blood pressure 85/60, pulse 96, temperature 97.8 F (36.6 C), temperature source Oral, resp. rate 20, height 5' 9.25" (1.759 m), weight 97.977 kg (216 lb).  Current Medications: Current Facility-Administered Medications  Medication Dose Route Frequency Provider Last Rate  Last Dose  . acetaminophen (TYLENOL) tablet 650 mg  650 mg Oral Q6H PRN Verne Spurr, PA-C      . alum & mag hydroxide-simeth (MAALOX/MYLANTA) 200-200-20 MG/5ML suspension 30 mL  30 mL Oral Q4H PRN Verne Spurr, PA-C      . amitriptyline (ELAVIL) tablet 50 mg  50 mg Oral QHS Verne Spurr, PA-C   50 mg at 09/22/11 2212  . benztropine (COGENTIN) tablet 1 mg  1 mg Oral BID Verne Spurr, PA-C   1 mg at 09/23/11 1610  . divalproex (DEPAKOTE ER) 24 hr tablet 1,000 mg  1,000 mg Oral QHS Verne Spurr, PA-C   1,000 mg at 09/22/11 2211  . hydrOXYzine (ATARAX/VISTARIL) tablet 25 mg  25 mg Oral Q1H PRN [name] Adams, PA   25 mg at 09/22/11 1254  . ibuprofen (ADVIL,MOTRIN) tablet 600 mg  600 mg Oral Q8H PRN Verne Spurr, PA-C   600 mg at 09/23/11 0810  . lamoTRIgine (LAMICTAL) tablet 150 mg  150 mg Oral QHS Verne Spurr, PA-C   150 mg at 09/22/11 2212  . levothyroxine (SYNTHROID, LEVOTHROID) tablet 50 mcg  50 mcg Oral Daily Verne Spurr, PA-C   50 mcg at 09/23/11 9604  . LORazepam (ATIVAN) tablet 1 mg  1 mg Oral TID Verne Spurr, PA-C   1 mg at 09/23/11 5409  . magnesium hydroxide (MILK OF MAGNESIA) suspension 30 mL  30 mL Oral Daily PRN Verne Spurr, PA-C      . nicotine (NICODERM CQ - dosed in mg/24 hours) patch 21 mg  21 mg Transdermal Daily Curlene Labrum Sonny Poth, MD   21 mg at 09/23/11 0808  . risperiDONE (RISPERDAL) tablet 1 mg  1 mg  Oral QHS Wonda Cerise, MD   1 mg at 09/22/11 2212  . sertraline (ZOLOFT) tablet 150 mg  150 mg Oral Q breakfast Verne Spurr, PA-C   150 mg at 09/23/11 5409  . DISCONTD: benztropine (COGENTIN) tablet 0.5 mg  0.5 mg Oral BID Wonda Cerise, MD   0.5 mg at 09/22/11 1723  . DISCONTD: LORazepam (ATIVAN) tablet 1 mg  1 mg Oral Q8H PRN Verne Spurr, PA-C   1 mg at 09/22/11 1451   Lab Results:  Results for orders placed during the hospital encounter of 09/21/11 (from the past 48 hour(s))  TSH     Status: Normal   Collection Time   09/22/11  7:30 PM      Component Value Range  Comment   TSH 2.013  0.350 - 4.500 (uIU/mL)   T4, FREE     Status: Normal   Collection Time   09/22/11  7:30 PM      Component Value Range Comment   Free T4 0.87  0.80 - 1.80 (ng/dL)   LIPID PANEL     Status: Abnormal   Collection Time   09/22/11  7:30 PM      Component Value Range Comment   Cholesterol 289 (*) 0 - 200 (mg/dL)    Triglycerides 8119 (*) <150 (mg/dL)    HDL 27 (*) >14 (mg/dL)    Total CHOL/HDL Ratio 10.7      VLDL UNABLE TO CALCULATE IF TRIGLYCERIDE OVER 400 mg/dL  0 - 40 (mg/dL)    LDL Cholesterol UNABLE TO CALCULATE IF TRIGLYCERIDE OVER 400 mg/dL  0 - 99 (mg/dL)   HEMOGLOBIN N8G     Status: Normal   Collection Time   09/22/11  7:30 PM      Component Value Range Comment   Hemoglobin A1C 5.6  <5.7 (%)    Mean Plasma Glucose 114  <117 (mg/dL)   COMPREHENSIVE METABOLIC PANEL     Status: Abnormal   Collection Time   09/22/11  7:30 PM      Component Value Range Comment   Sodium 134 (*) 135 - 145 (mEq/L)    Potassium 4.6  3.5 - 5.1 (mEq/L)    Chloride 99  96 - 112 (mEq/L)    CO2 25  19 - 32 (mEq/L)    Glucose, Bld 113 (*) 70 - 99 (mg/dL)    BUN 16  6 - 23 (mg/dL)    Creatinine, Ser 9.56  0.50 - 1.10 (mg/dL)    Calcium 9.2  8.4 - 10.5 (mg/dL)    Total Protein 6.7  6.0 - 8.3 (g/dL)    Albumin 3.5  3.5 - 5.2 (g/dL)    AST 12  0 - 37 (U/L)    ALT 7  0 - 35 (U/L)    Alkaline Phosphatase 62  39 - 117 (U/L)    Total Bilirubin 0.1 (*) 0.3 - 1.2 (mg/dL)    GFR calc non Af Amer 81 (*) >90 (mL/min)    GFR calc Af Amer >90  >90 (mL/min)    The patient was seen today and reports to being readmitted after being to experience side effects "as if my skin is crawling."  The patient states that this began approximately 2 weeks after discharge.  She reports that he outpatient provider discontinued and or changed at least 3 of her medications with a return of her depression, anxiety, suicidal ideations and auditory hallucinations.  However the patient states the side effects only  worsened.  Currently she  reports severe depressive and anxiety symptoms as well as a return of her auditory hallucinations.  She denies any visual hallucination or delusional thinking today.  It was discussed with the patient the possibility of restarting her on all of the medications she was taking prior to discharge as a starting point and she agreed.  Treatment Plan Summary:  1. Daily contact with patient to assess and evaluate symptoms and progress in treatment  2. Medication management  3. The patient will deny suicidal ideations or homicidal ideations for 48 hours prior to discharge and have a depression and anxiety rating of 3 or less. The patient will also deny any auditory or visual hallucinations or delusional thinking.  4. The patient will deny any symptoms of substance withdrawal at time of discharge.  Plan:  1. Will restart the medication Elavil at 50 mgs po qhs for sleep and depression. 2. Will restart the medication Cogentin at 1 mg po qam and hs for EPS. 3. Will restart the medication Depakote ER at 1000 mgs po qhs for mood stabilization. 4. Will restart the medication Lamictal at 150 mgs po qhs for mood stabilization. 5. Will restart the medication Modafinil at 200 mgs po qam for excessive daytime sedation related to OSA. 6. Will restart the medication Risperdal at 2 mgs po qam and hs for psychosis. 7. Will restart the medication Zoloft 150 mgs po q am for depression and anxiety. 8. Will discontinue the medication Ativan. 9. Laboratory studies reviewed.  10. Will continue to monitor.   Marquavis Hannen 09/23/2011, 8:12 AM

## 2011-09-23 NOTE — Progress Notes (Signed)
Pt has been up intermittently during the day today, limited interaction and has had some trouble participating in various activities today, pt has spoken about feelings of anxiety and having feelings like something is crawling inside her skin, pt did talk about this today and about how her medications were changed before coming into the hospital, pt has received all medications without incident, support provided, will continue to monitor

## 2011-09-23 NOTE — Progress Notes (Signed)
Pt. Has been using a wheel chair this evening and reports that she didn't want to fall.  Reports that it feels like bugs are crawling on her.  Pt. Did not attend group this evening.   Denies SI/HI and contracts for safety.  Support given.

## 2011-09-23 NOTE — Discharge Planning (Signed)
Met with patient in Treatment Team, as she did not attend Aftercare Planning Group today.  She stated that she does plan to return home with her husband as well as best friend, his wife, and their child.  Patient currently follows up with Dr. [name] of the [name] in [place]; however, she has had her initial meeting with the [facility] she was previously referred to by this hospital, and she does not know when they may start with her.  Case Manager informed her that this will be pursued during her hospital stay.  Patient was constantly rubbing her arms and legs throughout interview, stated that it is really not an itching sensation, but more like she is feeling things on her.  No case management needs today.  Ambrose Mantle, LCSW 09/23/2011, 1:11 PM

## 2011-09-24 MED ORDER — HALOPERIDOL 5 MG PO TABS
5.0000 mg | ORAL_TABLET | ORAL | Status: AC
  Administered 2011-09-24: 5 mg via ORAL

## 2011-09-24 MED ORDER — HYDROXYZINE HCL 50 MG PO TABS: 50.0000 mg | ORAL_TABLET | Freq: Four times a day (QID) | ORAL | Status: DC | PRN

## 2011-09-24 MED ORDER — AMITRIPTYLINE HCL 25 MG PO TABS
25.0000 mg | ORAL_TABLET | Freq: Every day | ORAL | Status: DC
  Administered 2011-09-24 – 2011-09-29 (×6): 25 mg via ORAL
  Filled 2011-09-24 (×8): qty 1

## 2011-09-24 MED ORDER — HALOPERIDOL 5 MG PO TABS
ORAL_TABLET | ORAL | Status: AC
  Filled 2011-09-24: qty 1

## 2011-09-24 MED ORDER — BENZTROPINE MESYLATE 2 MG PO TABS
2.0000 mg | ORAL_TABLET | ORAL | Status: DC
  Administered 2011-09-24 – 2011-09-30 (×12): 2 mg via ORAL
  Filled 2011-09-24 (×3): qty 1
  Filled 2011-09-24: qty 2
  Filled 2011-09-24 (×11): qty 1

## 2011-09-24 MED ORDER — DIPHENHYDRAMINE HCL 25 MG PO CAPS
25.0000 mg | ORAL_CAPSULE | ORAL | Status: AC
  Administered 2011-09-24: 25 mg via ORAL

## 2011-09-24 MED ORDER — DIPHENHYDRAMINE HCL 25 MG PO CAPS
ORAL_CAPSULE | ORAL | Status: AC
  Filled 2011-09-24: qty 1

## 2011-09-24 NOTE — Progress Notes (Signed)
Pt has attended groups and is interacting well with her peers. Pt main complaint has been her legs and she feels as though she can not control them. Pt had been given medication and will also get some medication at hs that will also help. Pt was offered support and encouragement. Pt is receptive to care and safety maintained on the unit.

## 2011-09-24 NOTE — Progress Notes (Signed)
Currently resting quietly in bed in supine position with eyes closed. CPAP noted at pts bedside and appears to be operating without issue. Respirations are even and unlabored. No acute distress or pain noted. Safety has been maintained with Q15 minute observation. Will continue current POC.

## 2011-09-24 NOTE — Discharge Planning (Signed)
Patient did not attend Aftercare Planning Group today, stated she wanted to stay in her room.  She was seen later in the dayroom and said she had no case management needs.  Ambrose Mantle, LCSW 09/24/2011, 4:44 PM

## 2011-09-24 NOTE — Progress Notes (Signed)
Pt reports tactile hallucinations of skin crawling and voices. Offered support and gave prn medication. Safety maintained on unit

## 2011-09-24 NOTE — Progress Notes (Signed)
[facility] MD Progress Note  09/24/2011 5:58 PM  Diagnosis:  Axis I: Schizophrenia - Paranoid Type.  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  Posttraumatic Stress Disorder.  Panic Disorder with Agoraphobia.   The patient was seen today and reports the following:   ADL's: Intact.  Sleep: The patient reports to sleeping reasonably well last night. Appetite: The patient reports a decreased appetite today.   Mild>(1-10) >Severe  Hopelessness (1-10): 7  Depression (1-10): 7  Anxiety (1-10): 8   Suicidal Ideation: The patient denies any suicidal ideations today.  Plan: No  Intent: No  Means: No   Homicidal Ideation: The patient denies any homicidal ideations today.  Plan: No  Intent: No.  Means: No   General Appearance/Behavior: Appropriate and cooperative today but continues to appear significantly depressed.  Eye Contact: Fair to Good.  Speech: Appropriate in rate and volume today with no pressuring noted.  Motor Behavior: wnl.  Level of Consciousness: Alert and Oriented x 3.  Mental Status: Alert and Oriented x 3.  Mood: Moderate to severely depressed.  Affect: Moderate to Severely constricted.  Anxiety Level: Severe anxiety reported.  Thought Process: Reports ongoing auditory hallucinations today which are decreasing.  Thought Content: The patient reports auditory hallucinations which are improving and denies any visual hallucinations today or any delusional thinking.  Perception:. Auditory hallucinations reported but improving.  Judgment: Fair.  Insight: Fair.  Cognition: Oriented to person, place and time.  Sleep:  Number of Hours: 6.75    Vital Signs:Blood pressure 88/67, pulse 88, temperature 97.3 F (36.3 C), temperature source Oral, resp. rate 20, height 5' 9.25" (1.759 m), weight 97.977 kg (216 lb).  Current Medications: Current Facility-Administered Medications  Medication Dose Route Frequency Provider Last Rate Last Dose  . acetaminophen (TYLENOL) tablet  650 mg  650 mg Oral Q6H PRN Verne Spurr, PA-C      . alum & mag hydroxide-simeth (MAALOX/MYLANTA) 200-200-20 MG/5ML suspension 30 mL  30 mL Oral Q4H PRN Verne Spurr, PA-C      . amitriptyline (ELAVIL) tablet 25 mg  25 mg Oral QHS Nerea Bordenave D Marrisa Kimber, MD      . benztropine (COGENTIN) tablet 2 mg  2 mg Oral BH-qamhs Jahrel Borthwick D Catha Ontko, MD      . diphenhydrAMINE (BENADRYL) 25 mg capsule           . diphenhydrAMINE (BENADRYL) capsule 25 mg  25 mg Oral NOW Curlene Labrum Joziah Dollins, MD   25 mg at 09/24/11 1441  . divalproex (DEPAKOTE ER) 24 hr tablet 1,000 mg  1,000 mg Oral QHS Curlene Labrum Jabes Primo, MD   1,000 mg at 09/23/11 2219  . haloperidol (HALDOL) 5 MG tablet           . haloperidol (HALDOL) tablet 5 mg  5 mg Oral NOW Curlene Labrum Marquize Seib, MD   5 mg at 09/24/11 1442  . hydrOXYzine (ATARAX/VISTARIL) tablet 25 mg  25 mg Oral Q1H PRN [name] Adams, PA   25 mg at 09/24/11 1418  . ibuprofen (ADVIL,MOTRIN) tablet 600 mg  600 mg Oral Q8H PRN Verne Spurr, PA-C   600 mg at 09/24/11 0856  . lamoTRIgine (LAMICTAL) tablet 150 mg  150 mg Oral QHS Curlene Labrum Estelle Skibicki, MD   150 mg at 09/23/11 2219  . levothyroxine (SYNTHROID, LEVOTHROID) tablet 50 mcg  50 mcg Oral Daily Curlene Labrum Vaishali Baise, MD   50 mcg at 09/24/11 0848  . magnesium hydroxide (MILK OF MAGNESIA) suspension 30 mL  30 mL Oral Daily  PRN Verne Spurr, PA-C      . modafinil (PROVIGIL) tablet 200 mg  200 mg Oral Daily Curlene Labrum Evyn Kooyman, MD   200 mg at 09/24/11 0848  . nicotine (NICODERM CQ - dosed in mg/24 hours) patch 21 mg  21 mg Transdermal Daily Curlene Labrum Nyasiah Moffet, MD   21 mg at 09/24/11 0851  . risperiDONE (RISPERDAL) tablet 2 mg  2 mg Oral BH-qamhs Curlene Labrum Revis Whalin, MD   2 mg at 09/24/11 0847  . sertraline (ZOLOFT) tablet 150 mg  150 mg Oral Q breakfast Curlene Labrum Terre Zabriskie, MD   150 mg at 09/24/11 0848  . DISCONTD: amitriptyline (ELAVIL) tablet 50 mg  50 mg Oral QHS Curlene Labrum Mac Dowdell, MD   50 mg at 09/23/11 2219  . DISCONTD: benztropine (COGENTIN) tablet 1 mg  1 mg Oral  BH-qamhs Curlene Labrum Yui Mulvaney, MD   1 mg at 09/24/11 0847   Lab Results:  Results for orders placed during the hospital encounter of 09/21/11 (from the past 48 hour(s))  TSH     Status: Normal   Collection Time   09/22/11  7:30 PM      Component Value Range Comment   TSH 2.013  0.350 - 4.500 (uIU/mL)   T4, FREE     Status: Normal   Collection Time   09/22/11  7:30 PM      Component Value Range Comment   Free T4 0.87  0.80 - 1.80 (ng/dL)   LIPID PANEL     Status: Abnormal   Collection Time   09/22/11  7:30 PM      Component Value Range Comment   Cholesterol 289 (*) 0 - 200 (mg/dL)    Triglycerides 9622 (*) <150 (mg/dL)    HDL 27 (*) >29 (mg/dL)    Total CHOL/HDL Ratio 10.7      VLDL UNABLE TO CALCULATE IF TRIGLYCERIDE OVER 400 mg/dL  0 - 40 (mg/dL)    LDL Cholesterol UNABLE TO CALCULATE IF TRIGLYCERIDE OVER 400 mg/dL  0 - 99 (mg/dL)   HEMOGLOBIN N9G     Status: Normal   Collection Time   09/22/11  7:30 PM      Component Value Range Comment   Hemoglobin A1C 5.6  <5.7 (%)    Mean Plasma Glucose 114  <117 (mg/dL)   COMPREHENSIVE METABOLIC PANEL     Status: Abnormal   Collection Time   09/22/11  7:30 PM      Component Value Range Comment   Sodium 134 (*) 135 - 145 (mEq/L)    Potassium 4.6  3.5 - 5.1 (mEq/L)    Chloride 99  96 - 112 (mEq/L)    CO2 25  19 - 32 (mEq/L)    Glucose, Bld 113 (*) 70 - 99 (mg/dL)    BUN 16  6 - 23 (mg/dL)    Creatinine, Ser 9.21  0.50 - 1.10 (mg/dL)    Calcium 9.2  8.4 - 10.5 (mg/dL)    Total Protein 6.7  6.0 - 8.3 (g/dL)    Albumin 3.5  3.5 - 5.2 (g/dL)    AST 12  0 - 37 (U/L)    ALT 7  0 - 35 (U/L)    Alkaline Phosphatase 62  39 - 117 (U/L)    Total Bilirubin 0.1 (*) 0.3 - 1.2 (mg/dL)    GFR calc non Af Amer 81 (*) >90 (mL/min)    GFR calc Af Amer >90  >90 (mL/min)    Review of System: Neurological:  The patient denies headaches, dizziness or seizures today. G.I.:  The patient denies any constipation or stomach upset today. Musculoskeletal:  The  patient reports to feeling "as if my skin is crawling".  She denies any other musculoskeletal concerns.  The patient was seen today and reports to sleeping reasonably well last night with C-PAP.  She reports a decreased appetite and reports moderate to severe feelings of sadness, anhedonia and depressed mood.  The patient also reports severe anxiety.  She denies any suicidal or homicidal ideations.  The patient reports ongoing auditory hallucinations which she states are improving.  She denies any visual hallucinations or delusional thinking.  The patient continues to report to "feeling anxious as if my skin is crawling" but does not appear outwardly stressed today.  Treatment Plan Summary:  1. Daily contact with patient to assess and evaluate symptoms and progress in treatment  2. Medication management  3. The patient will deny suicidal ideations or homicidal ideations for 48 hours prior to discharge and have a depression and anxiety rating of 3 or less. The patient will also deny any auditory or visual hallucinations or delusional thinking.  4. The patient will deny any symptoms of substance withdrawal at time of discharge.  Plan:  1. Will decrease the medication Elavil to 25 mgs po qhs for sleep and depression.  This is being decreased due to daytime sedation. 2. Will increase the medication Cogentin to 2 mg po qam and hs for EPS. 3. Will continue the medication Depakote ER at 1000 mgs po qhs for mood stabilization. 4. Will continue the medication Lamictal at 150 mgs po qhs for mood stabilization. 5. Will continue the medication Modafinil at 200 mgs po qam for excessive daytime sedation related to OSA. 6. Will continue the medication Risperdal at 2 mgs po qam and hs for psychosis. 7. Will continue the medication Zoloft 150 mgs po q am for depression and anxiety. 8. Laboratory studies reviewed.  9. Will continue to monitor.   Danielle [name] 09/24/2011, 5:58 PM

## 2011-09-25 MED ORDER — RISPERIDONE 2 MG PO TABS
2.0000 mg | ORAL_TABLET | ORAL | Status: DC
  Administered 2011-09-25 – 2011-09-30 (×14): 2 mg via ORAL
  Filled 2011-09-25 (×17): qty 1

## 2011-09-25 NOTE — Progress Notes (Signed)
Pt stated that she started feeling a little weak and needed to use her wheelchair throughout the day. Pt attended some groups. Pt was offered support and encouragement. Pt is receptive to treatment and safety maintained on unit.

## 2011-09-25 NOTE — Progress Notes (Signed)
[facility] MD Progress Note  09/24/2011 5:58 PM  Diagnosis:  Axis I: Schizophrenia - Paranoid Type.  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  Posttraumatic Stress Disorder.  Panic Disorder with Agoraphobia.   The patient was seen today and reports the following:   ADL's: Intact.  Sleep: The patient reports to again sleeping reasonably well last night. Appetite: The patient reports a good appetite today.   Mild>(1-10) >Severe  Hopelessness (1-10): 7-8  Depression (1-10): 7  Anxiety (1-10): 9-10  Suicidal Ideation: The patient reports some episodic suicidal ideations today but with no plan or intent.  Plan: No  Intent: No  Means: No   Homicidal Ideation: The patient denies any homicidal ideations today.  Plan: No  Intent: No.  Means: No   General Appearance/Behavior: Appropriate and cooperative today but continues to appear significantly depressed.  Eye Contact: Fair to Good.  Speech: Appropriate in rate and volume today with no pressuring noted.  Motor Behavior: wnl.  Level of Consciousness: Alert and Oriented x 3.  Mental Status: Alert and Oriented x 3.  Mood: Moderate to severely depressed.  Affect: Moderate to Severely constricted.  Anxiety Level: Severe anxiety reported.  Thought Process: wnl Thought Content: The patient denies any auditory or visual hallucinations today or any delusional thinking.  Perception:. wnl.  Judgment: Fair.  Insight: Fair.  Cognition: Oriented to person, place and time.   Current Medications: . amitriptyline  25 mg Oral QHS  . benztropine  2 mg Oral BH-qamhs  . diphenhydrAMINE      . divalproex  1,000 mg Oral QHS  . haloperidol      . lamoTRIgine  150 mg Oral QHS  . levothyroxine  50 mcg Oral Daily  . modafinil  200 mg Oral Daily  . nicotine  21 mg Transdermal Daily  . risperiDONE  2 mg Oral BH-[id number]  . sertraline  150 mg Oral Q breakfast  . DISCONTD: amitriptyline  50 mg Oral QHS  . DISCONTD: benztropine  1 mg Oral BH-qamhs    . DISCONTD: risperiDONE  2 mg Oral BH-qamhs   Review of System: Neurological:  The patient denies headaches, dizziness or seizures today. G.I.:  The patient denies any constipation or stomach upset today. Musculoskeletal:  The patient reports ongoing feelings "that my skin is crawling" but reports this is much improved since admission.  She denies any other musculoskeletal concerns.  The patient was seen today and reports to sleeping reasonably well with the C-PAP.  She reports a good appetite today and reports moderate to severe feelings of sadness, anhedonia and depressed mood.  The patient also reports ongoing severe anxiety.  She reports some suicidal ideations with no plan or intent and denies any homicidal ideations.  The patient denies any auditory or visual hallucinations today as well as any delusional thinking.  The patient continues to report to "feeling anxious as if my skin is crawling" but states this is much better since admission. The patient states that this feeling is occuring approximately 3 to 4 times a day and last approximately 30 minutes per episode.  At time of admission, the patient states this was a "constant feeling."  Ms. Borden also states that prior to admission her outpatient provider increased her Risperdal to 2 mgs po TID with good results in improving her symptoms.  She asked today if this medication could be increased.   Treatment Plan Summary:  1. Daily contact with patient to assess and evaluate symptoms and progress in treatment  2. Medication management  3. The patient will deny suicidal ideations or homicidal ideations for 48 hours prior to discharge and have a depression and anxiety rating of 3 or less. The patient will also deny any auditory or visual hallucinations or delusional thinking.  4. The patient will deny any symptoms of substance withdrawal at time of discharge.  Plan:  1. Will continue the medication Elavil to 25 mgs po qhs for sleep and  depression.   2. Will continue the medication Cogentin to 2 mg po qam and hs for EPS. 3. Will continue the medication Depakote ER at 1000 mgs po qhs for mood stabilization. 4. Will continue the medication Lamictal at 150 mgs po qhs for mood stabilization. 5. Will continue the medication Modafinil at 200 mgs po qam for excessive daytime sedation related to OSA. 6. Will increase the medication Risperdal to 2 mgs po q am, 2 pm and hs for psychosis, anxiety and mood stabilization. 7. Will continue the medication Zoloft 150 mgs po q am for depression and anxiety. 8. Laboratory studies reviewed.  9. Will continue to monitor.   Danielle [name] 09/24/2011, 5:58 PM

## 2011-09-26 DIAGNOSIS — F4310 Post-traumatic stress disorder, unspecified: Secondary | ICD-10-CM

## 2011-09-26 DIAGNOSIS — F4001 Agoraphobia with panic disorder: Secondary | ICD-10-CM

## 2011-09-26 DIAGNOSIS — F332 Major depressive disorder, recurrent severe without psychotic features: Principal | ICD-10-CM

## 2011-09-26 DIAGNOSIS — F200 Paranoid schizophrenia: Secondary | ICD-10-CM

## 2011-09-26 NOTE — Discharge Planning (Signed)
Met with patient in Aftercare Planning Group.   She looked much improved, was smiling with broader affect.  She stated she feels she will be ready to leave the hospital by early next week.  Case Manager called and left a message with her ACT Team, [facility] [place], asking about where patient is in the process of admission so that she can be set up for services as soon as she leaves.  Ambrose Mantle, LCSW 09/26/2011, 2:00 PM

## 2011-09-26 NOTE — Progress Notes (Signed)
Patient in hallway ambulating on her W/C during this assessment. She reported that she continues to hear voices, although non command. "they tell me I'm stupid, you can't do that , you can't do this". Her mood and affect flat and depressed. She denied SI/HI and denied hallucinations. Q 15 minute checks continues to maintain safety.

## 2011-09-26 NOTE — Progress Notes (Signed)
[facility] MD Progress Note  09/26/2011 Hospital Day # 5  Diagnosis:  Axis I: Schizophrenia - Paranoid Type.  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  Posttraumatic Stress Disorder.  Panic Disorder with Agoraphobia.   The patient was seen today and reports the following:   ADL's: Intact.  Sleep: The patient reports to again sleeping reasonably well last night. Appetite: The patient reports a good appetite today.   Mild>(1-10) >Severe  Hopelessness (1-10): 0 Depression (1-10): 6 Anxiety (1-10): 6  Suicidal Ideation: The patient reports some episodic suicidal ideations today but with no plan or intent.  Plan: No  Intent: No  Means: No   Homicidal Ideation: The patient denies any homicidal ideations today.  Plan: No  Intent: No.  Means: No   General Appearance/Behavior: Pt. Appears brighter and calmer today.  She is cooperative with this provider  Eye Contact: Good.  Speech: normal in rate, rhythm and volume. Motor Behavior: wnl.  Level of Consciousness: Alert Mental Status: Oriented x 3.  Mood: less depressed today  Affect: less constricted.  Anxiety Level: moderate anxiety reported.  Thought Process: wnl Thought Content: The patient denies any auditory or visual hallucinations today or any delusional thinking.  Perception:. wnl.  Judgment: Fair.  Insight: Fair.  Cognition: at least average   Current Medications: . amitriptyline  25 mg Oral QHS  . benztropine  2 mg Oral BH-qamhs  . diphenhydrAMINE      . divalproex  1,000 mg Oral QHS  . haloperidol      . lamoTRIgine  150 mg Oral QHS  . levothyroxine  50 mcg Oral Daily  . modafinil  200 mg Oral Daily  . nicotine  21 mg Transdermal Daily  . risperiDONE  2 mg Oral BH-[id number]  . sertraline  150 mg Oral Q breakfast  . DISCONTD: amitriptyline  50 mg Oral QHS  . DISCONTD: benztropine  1 mg Oral BH-qamhs  . DISCONTD: risperiDONE  2 mg Oral BH-qamhs   Review of System: Neurological:  The patient denies  headaches, dizziness or seizures today. G.I.:  The patient denies any constipation or stomach upset today. Musculoskeletal:  The patient reports no further feelings of skin crawling.  The patient was seen today and reports to sleeping reasonably well with the C-PAP. She is doing much better and her symptoms have significantly resolved today.  She is resting comfortably in a wheel chair due to some reports of weakness, but states she is getting stronger every day.  She is encouraged to use the chair less and less in order to avoid muscle atrophy.  Danielle [name] talks about going home and feels that she may be ready to go home on Monday of next week.  Treatment Plan Summary:  1. Daily contact with patient to assess and evaluate symptoms and progress in treatment  2. Medication management  3. The patient will deny suicidal ideations or homicidal ideations for 48 hours prior to discharge and have a depression and anxiety rating of 3 or less. The patient will also deny any auditory or visual hallucinations or delusional thinking.  4. The patient will deny any symptoms of substance withdrawal at time of discharge.  Plan:  1. Continue all medication as currently written with no changes today. 2. Labs are reviewed. 3. Will anticipate discharge if no further complications. 4. Continue to monitor.  Danielle [name]. Eithen Castiglia PAC

## 2011-09-26 NOTE — Progress Notes (Signed)
Patient ID: Danielle [name], female   DOB: 06/28/1970, 41 y.o.   MRN: [id number]  Pt stated she had an "off and on day". Was unable to communicate what caused it to be off or on.

## 2011-09-26 NOTE — Progress Notes (Signed)
[facility] Group Notes:  (Counselor/Nursing/MHT/Case Management/Adjunct)  09/26/2011 9:15 AM  Type of Therapy:  Group Therapy 521/13  Participation Level:  Did Not Attend    Veto Kemps 09/26/2011, 9:15 AM

## 2011-09-26 NOTE — Progress Notes (Signed)
[facility] Group Notes:  (Counselor/Nursing/MHT/Case Management/Adjunct)  09/26/2011 9:16 AM  Type of Therapy:  Psychoeducational Skills/Group Therapy  Participation Level:  Did Not Attend  :   Danielle [name] 09/26/2011, 9:16 AM

## 2011-09-26 NOTE — Progress Notes (Signed)
Patient up in the wheelchair this morning, but has been walking more as the day has progressed.  Denies pain or discomfort.  States that the sensation of spiders crawling on her is gone now, but she is still having some auditory hallucinations.  Has been pleasant and cooperative with staff and peers.

## 2011-09-27 NOTE — Progress Notes (Signed)
D Kylina comes to the med window directly after attending her breakfast in the cafe'. She is in a wheelchair , speaks with psychomotor retardation. She makes poor eye contact. She is flat, blunted and speaks with thick tongue. A She takes her meds as ordered. R Safety is in place and POC includes fostering therapeutic relationship already established PD RN [facility]

## 2011-09-27 NOTE — Tx Team (Signed)
Interdisciplinary Treatment Plan Update (Adult)  Date:  09/27/2011  Time Reviewed:  10:15AM-11:15AM  Progress in Treatment: Attending groups:  Yes Participating in groups:   Yes  Taking medication as prescribed:    Yes Tolerating medication:   Yes Family/Significant other contact made:  Yes Patient understands diagnosis:   Yes Discussing patient identified problems/goals with staff:   Yes Medical problems stabilized or resolved:   Yes Denies suicidal/homicidal ideation:  Yes Issues/concerns per patient self-inventory:   None Other:    New problem(s) identified: No, Describe:    Reason for Continuation of Hospitalization: Medication stabilization Other; describe continued stabilization related to severity of symptoms and number of hospitalizations  Interventions implemented related to continuation of hospitalization:  Medication monitoring and adjustment, safety checks Q15 min., suicide risk assessment, group therapy, psychoeducation, collateral contact, aftercare planning, ongoing physician assessments, medication education  Additional comments:  Not applicable  Estimated length of stay:  3 days  Discharge Plan:  Return home with spouse/friends, follow up is currently set with Dr. [name], still waiting to hear back from [facility] about patient starting services with them.  New goal(s):  Not applicable  Review of initial/current patient goals per problem list:   1.  Goal(s):  Improve sleep and appetite to normal pattern.  Met:  Yes  Target date:  By Discharge   As evidenced by:  Patient is on a CPAP machine here.  She has gone through a sleep study and should be referred to [facility] for follow-up with a CPAP of her own.  We believe this will help keep her symptoms at bay.  2.  Goal(s):  Reduce depression from 5/20 level of "8" to no greater than 3 at discharge.  Met:  No  Target date:  By Discharge   As evidenced by:  Is at 3 today, but with it being at 7 and  6 the last two days, need longer to ensure it lasts.  3.  Goal(s):  Reduce anxiety from 5/20 level of "8" to no greater than 3 at discharge.  Met:  No  Target date:  By Discharge   As evidenced by:  Is at 3-4 today, but with it being at 9-10 and 7 the last two days, need longer to ensure it lasts.  4.  Goal(s):  Reduce hopelessness from 5/20 level of "8" to no greater than 3 at discharge.  Met:  No  Target date:  By Discharge   As evidenced by:  Is at 0 today, but with it being at 7-8 and 6 the last two days, need longer to ensure it lasts.  5. Goal(s): Reduce hallucinations, including tactile and auditory, to baseline per patient and husband.  Met: Yes Target date: By Discharge  As evidenced by: "they are gone"  6. Goal(s): Medication stabilization  Met: No  Target date: By Discharge  As evidenced by: Still ongoing  7. Goal(s): Follow up with [facility] to see how long before they start with patient.  Met: No  Target date: By Discharge  As evidenced by: Still needed, two messages have now been left for them, still awaiting call back.  Attendees: Patient:  Danielle [name]  09/27/2011 10:15AM-11:15AM  Family:     Physician:  Dr. Harvie Heck Readling 09/27/2011 10:15AM-11:15AM  Nursing:   Tacy Learn, RN 09/27/2011 10:15AM -11:15AM   Case Manager:  Ambrose Mantle, LCSW 09/27/2011 10:15AM-11:15AM  Counselor:  Veto Kemps, MT-BC 09/27/2011 10:15AM-11:15AM  Other:   Verne Spurr, PA 09/27/2011 10:15AM-11:15AM  Other:   Shelda Jakes, RN 09/27/2011 10:15AM-11:15AM  Other:      Other:       Scribe for Treatment Team:   Sarina Ser, 09/27/2011, 10:15AM-11:15AM

## 2011-09-27 NOTE — Progress Notes (Signed)
[facility] Group Notes:  (Counselor/Nursing/MHT/Case Management/Adjunct)  09/27/2011 8:44 AM  Type of Therapy:  Group Therapy  09/26/11  9:30, Music Therapy 1:15  Participation Level:  Active  Participation Quality:  Attentive and Sharing  Affect:  Appropriate  Cognitive:  Oriented  Insight:  Limited  Engagement in Group:  Good  Engagement in Therapy:  Good  Modes of Intervention:  Activity, Clarification, Education, Problem-solving, Support and Music  Summary of Progress/Problems: Patient was doing much better in group. She was alert and stated that she was not experiencing any of the itching that she had complained about through the week. She gave appropriate feedback to her peers. Continues to have difficulty stating how she will make herself busier when she goes home. Considering some of the mental health association groups if her husband will bring her. In music therapy, she exhibited lack of confidence but then was active and was able to identify several songs and sang others. Suggested that she could incorporate music in some of her activities.   Danielle [name] 09/27/2011, 8:44 AM

## 2011-09-27 NOTE — Progress Notes (Signed)
[facility] Group Notes:  (Counselor/Nursing/MHT/Case Management/Adjunct)  09/27/2011 2:49 PM  Type of Therapy:  Group Therapy  Participation Level:  Active  Participation Quality:  Attentive and Sharing  Affect:  Blunted  Cognitive:  Oriented  Insight:  Limited  Engagement in Group:  Good  Engagement in Therapy:  Good  Modes of Intervention:  Clarification, Education, Problem-solving and Support  Summary of Progress/Problems: Patient talked about wanting to go home and be with her daughter. She reported that her 87 year old grandson had been in trouble at school, throwing chairs, etc. He was admitted to a hospital. Patient wants to be there for them. Explained to her the reason for her discharge on Monday but she continued to state that she needs to be at home.   HartisAram Beecham 09/27/2011, 2:49 PM

## 2011-09-27 NOTE — Progress Notes (Signed)
Adult Services Patient-Family Contact/Session  Attendees:  Patient's husband, [name] Late entry: 5/22, 5/23  Goal(s):  Discuss discharge planning  Safety Concerns:  None   Narrative:  Reported that patient was stable for 1 week after last discharge, but then she started having the sensation of something crawling over her. When they went to Dr. Jennette Kettle she started taking her off some of her medications, and decreased the Zoloft, from 250 to 100. He would have brought her in earlier but her wanted her to get the sleep study. She did this on Wednesday. Reviewed with him the medications she is currently on. He does not want her back on Klonopin even though the patient is requesting this. Questioned whether she might be coming off of something, even an over the counter medication. He denied any.  Continues to have the itching. He reported that he had to go down to her room at lunch and felt like she was more depressed and anxious. Talked with him about her baseline. He stated that he had been working from 10:30 to 10:00am and that she tried to sleep when he slept and then when he went to work she slept through the night. His work schedule has changed (2:30 -11) and this should help patient establish a sleep pattern. Stated that she doesn't really do a whole lot. Used to go visit with his mother for coffee and others but hasn't been driving due to drowsiness. He would even like to take her on his motorcycle but is afraid that she would fall off due to drowsiness. Reported to him that in groups we were working on helping patient identify more activities for her to be involved. Met with husband on 5/23 when he visited patient at lunch. He noticed much improvement with patient. She did not report any itching to him and her mood was brighter. Tentative discharge on Monday if she continues to improve.    Barrier(s):  Patient's lack of motivation to get out of the house.  Interventions:  Support, information,  discharge planning  Recommendation(s):  Outpatient follow up and community support groups, social activities.  Follow-up Required:  No  Explanation:    Veto Kemps 09/27/2011, 9:56 AM

## 2011-09-27 NOTE — Progress Notes (Signed)
09/27/2011         Time: 0930      Group Topic/Focus: The focus of the group is on enhancing the patients' ability to cope with stressors by understanding what coping is, why it is important, the negative effects of stress and developing healthier coping skills. Patients practice [place] and discuss how exercise can be used as a healthy coping strategy.  Participation Level: Active  Participation Quality: Attentive and Redirectable  Affect: Blunted  Cognitive: Oriented  Additional Comments: Patient flat, participating appropriately in group. Patient needed some reminders to stay in her wheelchair, patient observed to be very unsteady on her feet.   Kolby Schara 09/27/2011 12:24 PM

## 2011-09-27 NOTE — Discharge Planning (Signed)
Met with patient in Aftercare Planning Group.   She states she is feeling very good.  She asked about the follow-up with the ACT team, and Case Manager told her we are awaiting call back.  Called and left another message.  She seems to be very eager.  Ambrose Mantle, LCSW 09/27/2011, 2:42 PM

## 2011-09-28 DIAGNOSIS — F339 Major depressive disorder, recurrent, unspecified: Secondary | ICD-10-CM

## 2011-09-28 DIAGNOSIS — F411 Generalized anxiety disorder: Secondary | ICD-10-CM

## 2011-09-28 NOTE — Progress Notes (Signed)
Patient ID: Danielle [name], female   DOB: 07-29-1970, 41 y.o.   MRN: [id number]  [facility] Group Notes:  (Counselor/Nursing/MHT/Case Management/Adjunct)  09/28/2011 11 AM  Type of Therapy:  Aftercare Planning, Group Therapy, Dance/Movement Therapy   Participation Level:  Active  Participation Quality:  Appropriate and Attentive  Affect:  Appropriate  Cognitive:  Appropriate  Insight:  Good  Engagement in Group:  Good  Engagement in Therapy:  Good  Modes of Intervention:  Clarification, Problem-solving, Role-play, Socialization and Support  Summary of Progress/Problems: After Care: Pt. attended and participated in aftercare planning group. Pt. accepted information on suicide prevention, warning signs to look for with suicide and crisis line numbers to use. The pt. agreed to call crisis line numbers if having warning signs or having thoughts of suicide. Pt. listed their current anxiety level and depression level as a 7 or 8. Pt. shared that she was feeling "blah." Pt. Stated that she will be transferred to [facility] at discharge.  Counseling: The therapist discussed coping skills that can be utilized in and out of the hospital and treatment programs. Therapist also invited patients to share a good memory that they can think about when they feel depressed. Danielle [name] shared that she likes to take baths and reading; she shared that a good memory is being with her husband.     Gevena Mart

## 2011-09-28 NOTE — Progress Notes (Signed)
Pt is depressed anxious and worried  She expresses feeling guilty over the fact she is in the hospital when her daughter needs her at home because her grandson is having some behavioral problems at school  She said she wishes she could be more help for her daughter  Pt also discussed issues with her two boys and how stressful that could be sometimes   She continues to have and unsteady gait and verbalizes feeling dizzy so pt uses wheelchair to get around  She attends and participates in groups  She has limited interaction with others  Verbal support given  Medications administered and effectiveness monitored  Q 15 min checks  Pt safe at present

## 2011-09-28 NOTE — Progress Notes (Signed)
Patient was observed lying in bed resting but not asleep. Writer inquired as to how her day had been and patient reported that she had talked to her daughter and she was very upset because her grandson had gotten in trouble at school and now her daughter needed her and she feels that she needs to be with her for support. Writer encouraged patient to focus on herself and it is normal to want to help with her daughters situation but she really needs to focus on getting herself well first. Patient was agreeable with the advice and encouragement was offered. Patient decided that she was ready to take her hs medications and try and get some rest. Patient currently denies pain, -si/hi and visual hallucinations but does report auditory hallucinations. Safety maintained on unit, will continue to monitor.

## 2011-09-28 NOTE — Progress Notes (Signed)
Patient ID: Danielle [name], female   DOB: 1971-03-08, 41 y.o.   MRN: [id number] The patient spent most of the evening isolating in her room. Stated that she was having a bad evening due to problems at home. She did attend evening group, but returned afterwards to her room. She is depressed and anxious. She continues to report feeling dizzy and lightheaded when she tries to stand or walk. Encouraged to use the wheel chair as needed to prevent falls.

## 2011-09-28 NOTE — Progress Notes (Signed)
  Danielle [name] is a 41 y.o. female [id number] May 30, 1970  09/21/2011 Principal Problem:  *Schizophrenia, paranoid type Active Problems:  Panic disorder with agoraphobia  Major depressive disorder, recurrent  Post traumatic stress disorder (PTSD)  Generalized anxiety disorder   Mental Status: Alert superficially oriented. Denies SI/HI/AVH .    Subjective/Objective: Sitting in wheelchair. Mood and affect brightder and more normal than last weekend. Says her 43 yo grndson was to have had a psych admission yesterday due to throwing things in the classroom. Wants discharge to be there for her daughter and grandson. CPAP keeps her from sleeping through th enight.     Filed Vitals:   09/28/11 0846  BP: 109/79  Pulse: 102  Temp: 97.5 F (36.4 C)  Resp: 16    Lab Results:   BMET    Component Value Date/Time   NA 134* 09/22/2011 1930   K 4.6 09/22/2011 1930   CL 99 09/22/2011 1930   CO2 25 09/22/2011 1930   GLUCOSE 113* 09/22/2011 1930   BUN 16 09/22/2011 1930   CREATININE 0.87 09/22/2011 1930   CALCIUM 9.2 09/22/2011 1930   GFRNONAA 81* 09/22/2011 1930   GFRAA >90 09/22/2011 1930    Medications:  Scheduled:     . amitriptyline  25 mg Oral QHS  . benztropine  2 mg Oral BH-qamhs  . divalproex  1,000 mg Oral QHS  . lamoTRIgine  150 mg Oral QHS  . levothyroxine  50 mcg Oral Daily  . modafinil  200 mg Oral Daily  . nicotine  21 mg Transdermal Daily  . risperiDONE  2 mg Oral BH-q8a2phs  . sertraline  150 mg Oral Q breakfast     PRN Meds acetaminophen, alum & mag hydroxide-simeth, hydrOXYzine, ibuprofen, magnesium hydroxide  Plan: continue current plan of care.  [name],[name] 09/28/2011

## 2011-09-29 NOTE — Progress Notes (Signed)
Patient ID: JERSIE BEEL, female   DOB: 1970-07-13, 41 y.o.   MRN: [id number]   [facility] Group Notes:  (Counselor/Nursing/MHT/Case Management/Adjunct)  09/29/2011 11 AM  Type of Therapy:  Aftercare Planning, Group Therapy, Dance/Movement Therapy   Participation Level:  Active  Participation Quality:  Appropriate and Attentive  Affect:  Appropriate  Cognitive:  Appropriate  Insight:  Good  Engagement in Group:  Good  Engagement in Therapy:  Good  Modes of Intervention:  Clarification, Problem-solving, Role-play, Socialization and Support  Summary of Progress/Problems: After Care: Pt. attended and participated in aftercare planning group. Pt. verbally accepted information on suicide prevention, warning signs to look for with suicide and crisis line numbers to use. Therapist invited patients to describe their current mood in terms of a weather forecast. Pt. shared that she was feeling like "light to medium rain."  Pt. reported that her discharge is set for tomorrow, Monday May 27, and still working to set up an ACT team.  Counseling: Therapists discussed support and invited patients to share positive and negative supports as well as supports that can be both. Patients imagined tools that could help them overcome negative supports and were able to share how they could identify when a support was unhealthy. Mady shared that her husband is a positive support but drugs and alcohol are negative support. She said that her grandchildren can be a positive support but can also trigger unhealthy behavior.     Gevena Mart

## 2011-09-29 NOTE — Progress Notes (Signed)
  Danielle [name] is a 41 y.o. female [id number] 1971/04/10  09/21/2011 Principal Problem:  *Schizophrenia, paranoid type Active Problems:  Panic disorder with agoraphobia  Major depressive disorder, recurrent  Post traumatic stress disorder (PTSD)  Generalized anxiety disorder   Mental Status:    Subjective/Objective:    Filed Vitals:   09/29/11 0701  BP: 102/71  Pulse: 99  Temp:   Resp:     Lab Results:   BMET    Component Value Date/Time   NA 134* 09/22/2011 1930   K 4.6 09/22/2011 1930   CL 99 09/22/2011 1930   CO2 25 09/22/2011 1930   GLUCOSE 113* 09/22/2011 1930   BUN 16 09/22/2011 1930   CREATININE 0.87 09/22/2011 1930   CALCIUM 9.2 09/22/2011 1930   GFRNONAA 81* 09/22/2011 1930   GFRAA >90 09/22/2011 1930    Medications:  Scheduled:     . amitriptyline  25 mg Oral QHS  . benztropine  2 mg Oral BH-qamhs  . divalproex  1,000 mg Oral QHS  . lamoTRIgine  150 mg Oral QHS  . levothyroxine  50 mcg Oral Daily  . modafinil  200 mg Oral Daily  . nicotine  21 mg Transdermal Daily  . risperiDONE  2 mg Oral BH-q8a2phs  . sertraline  150 mg Oral Q breakfast     PRN Meds acetaminophen, alum & mag hydroxide-simeth, hydrOXYzine, ibuprofen, magnesium hydroxide   Shamaya Kauer,[name] 09/29/2011    Danielle [name] is a 41 y.o. female [id number] 1970-11-24  09/21/2011 Principal Problem:  *Schizophrenia, paranoid type Active Problems:  Panic disorder with agoraphobia  Major depressive disorder, recurrent  Post traumatic stress disorder (PTSD)  Generalized anxiety disorder   Mental Status: Mood is worried denies SI/HI/AVH.    Subjective/Objective: Seen in bed after lunch.Worried because her daughter and grandson are moving back in with her. Says she has 10 people in her house now. Encouraged her to discuss with her husband and try to be supportive of his decisions.  Filed Vitals:   09/29/11 0701  BP: 102/71  Pulse: 99  Temp:   Resp:     Lab  Results:   BMET    Component Value Date/Time   NA 134* 09/22/2011 1930   K 4.6 09/22/2011 1930   CL 99 09/22/2011 1930   CO2 25 09/22/2011 1930   GLUCOSE 113* 09/22/2011 1930   BUN 16 09/22/2011 1930   CREATININE 0.87 09/22/2011 1930   CALCIUM 9.2 09/22/2011 1930   GFRNONAA 81* 09/22/2011 1930   GFRAA >90 09/22/2011 1930    Medications:  Scheduled:     . amitriptyline  25 mg Oral QHS  . benztropine  2 mg Oral BH-qamhs  . divalproex  1,000 mg Oral QHS  . lamoTRIgine  150 mg Oral QHS  . levothyroxine  50 mcg Oral Daily  . modafinil  200 mg Oral Daily  . nicotine  21 mg Transdermal Daily  . risperiDONE  2 mg Oral BH-q8a2phs  . sertraline  150 mg Oral Q breakfast     PRN Meds acetaminophen, alum & mag hydroxide-simeth, hydrOXYzine, ibuprofen, magnesium hydroxide  Plan: no med changes continue current plan of care.   Tekeisha Hakim,[name] 09/29/2011

## 2011-09-29 NOTE — Progress Notes (Signed)
Pt has been in bed most of the day today  She did not attend spirituality group  Her interactions with others is minimal   Her facial expression is more relaxed and she has had no episodes of panic attack or feeling like things are crawling on her   She is more relaxed and smiles more freely   Verbal support given  Medications aadministered and effectiveness monitored  Q 15 min checks  Pt safe at present

## 2011-09-30 DIAGNOSIS — G4733 Obstructive sleep apnea (adult) (pediatric): Secondary | ICD-10-CM

## 2011-09-30 MED ORDER — MODAFINIL 200 MG PO TABS: 200.0000 mg | ORAL_TABLET | Freq: Every day | ORAL | Status: AC

## 2011-09-30 MED ORDER — AMITRIPTYLINE HCL 50 MG PO TABS: 25.0000 mg | ORAL_TABLET | Freq: Every day | ORAL | Status: DC

## 2011-09-30 MED ORDER — LEVOTHYROXINE SODIUM 50 MCG PO TABS: 50.0000 ug | ORAL_TABLET | Freq: Every day | ORAL | Status: DC

## 2011-09-30 MED ORDER — BENZTROPINE MESYLATE 2 MG PO TABS: 2.0000 mg | ORAL_TABLET | ORAL | Status: DC

## 2011-09-30 MED ORDER — RISPERIDONE 2 MG PO TABS: ORAL_TABLET | ORAL | Status: DC

## 2011-09-30 MED ORDER — SERTRALINE HCL 100 MG PO TABS: 150.0000 mg | ORAL_TABLET | Freq: Every day | ORAL | Status: DC

## 2011-09-30 MED ORDER — DIVALPROEX SODIUM ER 500 MG PO TB24: 1000.0000 mg | ORAL_TABLET | Freq: Every day | ORAL | Status: DC

## 2011-09-30 MED ORDER — SERTRALINE HCL 100 MG PO TABS: 100.0000 mg | ORAL_TABLET | Freq: Every day | ORAL | Status: DC

## 2011-09-30 MED ORDER — LAMOTRIGINE 150 MG PO TABS: 150.0000 mg | ORAL_TABLET | Freq: Every day | ORAL | Status: DC

## 2011-09-30 MED ORDER — NAPROXEN SODIUM 220 MG PO TABS: 220.0000 mg | ORAL_TABLET | Freq: Two times a day (BID) | ORAL | Status: DC | PRN

## 2011-09-30 MED ORDER — AMITRIPTYLINE HCL 50 MG PO TABS: 50.0000 mg | ORAL_TABLET | Freq: Every day | ORAL | Status: DC

## 2011-09-30 MED ORDER — SERTRALINE HCL 50 MG PO TABS
150.0000 mg | ORAL_TABLET | Freq: Every day | ORAL | Status: DC
  Filled 2011-09-30 (×2): qty 9

## 2011-09-30 NOTE — Discharge Summary (Signed)
Physician Discharge Summary Note  Patient:  Danielle [name] is an 41 y.o., female MRN:  [id number] DOB:  29-Nov-1970 Patient phone:  [phone] (home)  Patient address:   [address] [place] [address],   Date of Admission:  09/21/2011 Date of Discharge: 09/30/2011  Reason for Admission: Return of psychosis  Discharge Diagnoses: Principal Problem:  *Schizophrenia, paranoid type Active Problems:  Panic disorder with agoraphobia  Major depressive disorder, recurrent  Post traumatic stress disorder (PTSD)  Generalized anxiety disorder   Axis Diagnosis:   Discharge Diagnoses:  AXIS I: Schizophrenia - Paranoid Type.  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  Posttraumatic Stress Disorder.  Panic Disorder with Agoraphobia.  AXIS II: Deferred.  AXIS III: 1. Seizure Disorder.  2. Heart Murmur.  3. Hypothyroidism.  4. Frequent Headaches.  5. Anemia.  6. Hyperlipidemia  AXIS IV: Chronic Mental Illness.  AXIS V: GAF at time of Admission approximately 35. GAF at time of Discharge approximately 55.   Level of Care:  OP  Hospital Course:  Danielle [name] was admitted due to a return of symptoms felt to be related to cessation of her most recent discharge medications by her out patient psychiatrist. She stated that she was itching all over and felt that "spiders and snakes" were crawling all over her.  Danielle [name] was started on Cogentin 2 mg, continued amitriptyline 50mg , and Lamictal 150mg , as well as Risperdal 2mg  at hs. Zoloft of 100mg  was continued.  Labs were drawn to evaluate her reports of pruritis and the results were unremarkable.  Her pain was treated with Anaprox, her synthroid was continued as well. She was also restarted on her Provigil for her obstructive sleep apnea.  Danielle [name] was started on self titrating CPAP as the results of her most recent sleep study were not available yet.  Her full mask was fitted and she did well with the CPAP and noted a good reduction in  symptoms related to this.    Danielle [name] did well in the therapeutic milieu.  She was evaluated daily by CM, Child psychotherapist, clinical treatment providers and nursing staff.  She attended groups and individual counseling sessions.  On the day of discharge Danielle [name] was in full touch with reality.  She reported a significant reduction in her symptoms and felt well enough to be discharged.  She was evaluated by the treatment team and all felt that she was safe to be discharged and her plan to follow up with her outpatient provider was good.  Consults:  None  Significant Diagnostic Studies:  labs: See admission H&P  Discharge Vitals:   Blood pressure 90/60, pulse 98, temperature 97.4 F (36.3 C), temperature source Oral, resp. rate 18, height 5' 9.25" (1.759 m), weight 97.977 kg (216 lb).  Mental Status Exam: See Mental Status Examination and Suicide Risk Assessment completed by Attending Physician prior to discharge.  Discharge destination:  Home  Is patient on multiple antipsychotic therapies at discharge:  No   Has Patient had three or more failed trials of antipsychotic monotherapy by history:  No  Recommended Plan for Multiple Antipsychotic Therapies: not applicable   Discharge Orders    Future Orders Please Complete By Expires   Diet - low sodium heart healthy      Increase activity slowly      Discharge instructions      Comments:   Take all medications as prescribed.  Keep all follow up appointments to make sure you get your medications refilled.  Medication List  As of 09/30/2011 11:49 AM   TAKE these medications      Indication    amitriptyline 50 MG tablet   Commonly known as: ELAVIL   Take 1 tablet (50 mg total) by mouth at bedtime. For depression and sleep    Indication: Depression      benztropine 2 MG tablet   Commonly known as: COGENTIN   Take 1 tablet (2 mg total) by mouth 2 (two) times daily in the am and at bedtime.. For side effects.    Indication: Extrapyramidal  Reaction caused by Medications      divalproex 500 MG 24 hr tablet   Commonly known as: DEPAKOTE ER   Take 2 tablets (1,000 mg total) by mouth at bedtime. For mood stability.    mood stabilization    lamoTRIgine 150 MG tablet   Commonly known as: LAMICTAL   Take 1 tablet (150 mg total) by mouth at bedtime. For mood stability    Indication: Depression      levothyroxine 50 MCG tablet   Commonly known as: SYNTHROID, LEVOTHROID   Take 1 tablet (50 mcg total) by mouth daily. Thyroid Supplement.    Indication: Underactive Thyroid      modafinil 200 MG tablet   Commonly known as: PROVIGIL   Take 1 tablet (200 mg total) by mouth daily. For daytime sleepiness.    Indication: Obstructive Sleep Apnea Syndrome      naproxen sodium 220 MG tablet   Commonly known as: ANAPROX   Take 1 tablet (220 mg total) by mouth 2 (two) times daily as needed. For pain.    Indication: Joint Damage causing Pain and Loss of Function      risperiDONE 2 MG tablet   Commonly known as: RISPERDAL   Take one tablet by mouth at 8am, 2pm and bedtime for mental clarity and psychosis.   For mental clarity and psychosis    sertraline 100 MG tablet   Commonly known as: ZOLOFT   Take 1 tablet (100 mg total) by mouth daily. For depression and anxiety.    Indication: Anxiety Disorder           Follow-up Information    Follow up with Dr. [name]. (Case Manager will call with this appointment date & time)    Contact information:   [facility] [address] [place], [place]  [address] Telephone:  [phone]      Follow up with [facility]. (Case Manager will call you about the results of contact with ACTT)    Contact information:   [facility] [address] [place]  [address] Telephone:  [phone]         Follow-up recommendations:  See above  Comments:    Signed: Tamala Julian [facility] For Dr. Curlene Labrum. [place] 09/30/2011, 11:49 AM

## 2011-09-30 NOTE — Progress Notes (Signed)
Danielle [name] is up on the 400 hall this morning...first thing. She says " I'm going  Home today..I can't wait!!!". She takes all of her medicaions as ordered. She  Completes her self inventory and on it she writes   She is not suicidal, she rates her feelings of depression and hopelessness " 4 / 4 "  And states her DC plan is " take time to look up side effects and watch for them". She is flat , depressed..but says she is " ready to go this time" . A She attends her AM group this morning...states she has " worked out" her DC plan . R Safety is maintained and POC icnludes continuing with DC preparations

## 2011-09-30 NOTE — [facility] Suicide Risk Assessment (Signed)
Suicide Risk Assessment  Discharge Assessment      Demographic factors: Caucasian  Current Mental Status Per Nursing Assessment: On Admission:  Suicidal ideation indicated by patient At Discharge: Pt denied any SI/HI/thoughts of self harm or acute psychiatric issues in treatment team with clinical, nursing and medical team present per Verne Spurr, PA.  Current Mental Status Per Physician: Patient seen and evaluated. Chart reviewed. Patient stated that her mood was "so much better". Her affect was mood congruent and euthymic. She denied any current thoughts of self injurious behavior, suicidal ideation or homicidal ideation. There were no auditory or visual hallucinations, paranoia, delusional thought processes, or mania noted on current meds. Thought process was linear and goal directed.  No psychomotor agitation or retardation was noted. Speech was normal rate, tone and volume. Eye contact was good. Judgment and insight are fair.  Patient has been up and engaged on the unit.  No acute safety concerns reported from team.    Loss Factors: Financial problems / change in socioeconomic status  Historical Factors: Prior suicide attempts- attempted to drown herself in the bathtub and cut herslef in the past  Risk Reduction Factors:  Good Primary Support System.  Good Access to Medical Care.  "supportive and wonderful" husband.  Discharge Diagnoses:  AXIS I:   Schizophrenia - Paranoid Type.    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    Posttraumatic Stress Disorder.    Panic Disorder with Agoraphobia.  AXIS II:   Deferred. AXIS III:   1.  Seizure Disorder.   2.  Heart Murmur.   3.  Hypothyroidism.   4.  Frequent Headaches.   5.  Anemia.    6.  Hyperlipidemia AXIS IV: Chronic Mental Illness.    AXIS V:   GAF at time of Admission approximately 35.  GAF at time of Discharge approximately 55.  Cognitive Features That Contribute To Risk: limited insight.  Suicide Risk: Pt viewed as  a chronic increased risk of harm to self in light of her past hx and risk factors.  No acute safety concerns on the unit.  Pt contracting for safety and has been stable and requesting discharge since last Friday.  Plan Of Care/Follow-up recommendations: Pt seen and evaluated. Chart reviewed.  Pt stable for and requesting discharge. Pt contracting for safety and does not currently meet [place] involuntary commitment criteria for continued hospitalization against her will.  Mental health treatment & medication management will mitigate against the increased risk of harm to self and/or others.  Discussed the importance of Tx further with pt, as well as, tools to move forward in a healthy & safe manner.  Pt agreeable with the plan.  Discussed with the team.  Please see orders, follow up appointments per AVS (Dr. [name]) and full discharge summary to be completed by physician extender.   Diet: Heart Healthy.  Activity: As tolerated.     Lupe Carney 09/30/2011, 11:58 AM

## 2011-09-30 NOTE — Progress Notes (Signed)
[facility] Group Notes:  (Counselor/Nursing/MHT/Case Management/Adjunct)  09/30/2011 1:13 PM  Type of Therapy:  Group Therapy  Participation Level:  Active  Participation Quality:  Appropriate  Affect:  Appropriate  Cognitive:  Appropriate  Insight:  Good  Engagement in Group:  Good  Engagement in Therapy:  Good  Modes of Intervention:  Clarification, Education, Problem-solving and Support  Summary of Progress/Problems: Patient is excited about being discharge. She expressed a little anxiety. Talked about stressors at home including her daughter who has 2 small children and is pregnant with a 3rd and is wanting to move back in with them after breakup with boyfriend. Patient stated that she told her they do not physically have room at this point due to her friend living with them. Eventually she will move to [place].   Lemoyne Nestor, Aram Beecham 09/30/2011, 1:13 PM

## 2011-09-30 NOTE — Progress Notes (Signed)
Pt is D/C home. Pt denies SI/HI/AV. Pt follow-up and medications were reviewed and pt verbalized understanding. Pt pleasant and cooperative. Pt belongings were returned.   

## 2011-09-30 NOTE — Progress Notes (Signed)
[facility] MD Progress Note  09/26/2011 Hospital Day # 6  Diagnosis:  Axis I: Schizophrenia - Paranoid Type.  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  Posttraumatic Stress Disorder.  Panic Disorder with Agoraphobia.   The patient was seen today and reports the following:   ADL's: Intact.  Sleep: The patient reports to sleeping good. Appetite: The patient reports a good appetite today.   Mild>(1-10) >Severe  Hopelessness (1-10): 0 Depression (1-10): 3 Anxiety (1-10): 0  Suicidal Ideation: The patient reports some episodic suicidal ideations today but with no plan or intent.  Plan: No  Intent: No  Means: No   Homicidal Ideation: The patient denies any homicidal ideations today.  Plan: No  Intent: No.  Means: No   General Appearance/Behavior: Pt. Appears brighter and calmer today.  She is cooperative with this provider and anticipates going home on Monday.  Eye Contact: Good.  Speech: normal in rate, rhythm and volume. Motor Behavior: wnl.  Level of Consciousness: Alert Mental Status: Oriented x 3.  Mood: less depressed today  Affect: less constricted.  Anxiety Level: moderate anxiety reported.  Thought Process: wnl Thought Content: The patient denies any auditory or visual hallucinations today or any delusional thinking.  Perception:. wnl.  Judgment: Fair.  Insight: Fair.  Cognition: at least average   Current Medications: . amitriptyline  25 mg Oral QHS  . benztropine  2 mg Oral BH-qamhs  . diphenhydrAMINE      . divalproex  1,000 mg Oral QHS  . haloperidol      . lamoTRIgine  150 mg Oral QHS  . levothyroxine  50 mcg Oral Daily  . modafinil  200 mg Oral Daily  . nicotine  21 mg Transdermal Daily  . risperiDONE  2 mg Oral BH-q8a2phs  . sertraline  150 mg Oral Q breakfast  . DISCONTD: amitriptyline  50 mg Oral QHS  . DISCONTD: benztropine  1 mg Oral BH-qamhs  . DISCONTD: risperiDONE  2 mg Oral BH-qamhs   Review of System: Neurological:  The patient  denies headaches, dizziness or seizures today. G.I.:  The patient denies any constipation or stomach upset today. Musculoskeletal:  The patient reports no further feelings of skin crawling.  The patient was seen today and reports to sleeping reasonably well with the C-PAP. Sleep study has not been read yet and the sleep clinic wants [place] to order a "self titrating" cpap . Respiratory will do this.  She is doing better each day and looks much more positive and in control of her self. Treatment Plan Summary:  1. Daily contact with patient to assess and evaluate symptoms and progress in treatment  2. Medication management  3. The patient will deny suicidal ideations or homicidal ideations for 48 hours prior to discharge and have a depression and anxiety rating of 3 or less. The patient will also deny any auditory or visual hallucinations or delusional thinking.  4. The patient will deny any symptoms of substance withdrawal at time of discharge.  Plan:  1. Continue all medication as currently written with no changes today. 2. Labs are reviewed. 3. Will anticipate discharge if no further complications on Monday. 4. Continue to monitor. 5. Will pursue sleep study results.  Will also order self titrating CPAP as instructed by DAVE at sleep clinic. Rona Ravens. Charisma Charlot PAC

## 2011-09-30 NOTE — Progress Notes (Signed)
Pt slept throughout evening and was awoken for hs meds and received a snack at that time. Pt returned to bed where she is currently resting. She denies SI/HI/AVH and is safe. Danielle [name]

## 2011-09-30 NOTE — Progress Notes (Signed)
[facility] Case Management Discharge Plan:  Will you be returning to the same living situation after discharge: Yes,  with husband and friends At discharge, do you have transportation home?:Yes,  husband will pick up Do you have the ability to pay for your medications:Yes,  has insurance and income, will be given 3 days samples  Interagency Information:     Release of information consent forms completed and in the chart;  Patient's signature needed at discharge.  Patient to Follow up at:  Follow-up Information    Follow up with Dr. [name]. (Case Manager will call with this appointment date & time)    Contact information:   [facility] [address] [place], [place]  [address] Telephone:  [phone]      Follow up with [facility]. (Case Manager will call you about the results of contact with ACTT)    Contact information:   [facility] [address] [place]  [address] Telephone:  [phone]         Patient denies SI/HI:   Yes,      Safety Planning and Suicide Prevention discussed:  Yes,  During Aftercare Planning Group, Case Manager provided psychoeducation on "Suicide Prevention Information."  This included descriptions of risk factors for suicide, warning signs that an individual is in crisis and thinking of suicide, and what to do if this occurs.  Pt indicated understanding of information provided, and will read brochure given upon discharge.     Barrier to discharge identified:No.  Patient's appointments for aftercare are not yet made, as Case Manager has placed calls for one with [name] and three with [facility] that have not yet been returned.  Patient's best number after discharge is [phone], and Case Manager will call with appointments.  Summary and Recommendations:  Patient is really an ideal candidate for ACTT services.  Until those are set up (an initial Comprehensive Clinical Assessment has already been done by [facility]), it is  recommended that patient continue to go to her current provider, Dr. [name].   Sarina Ser 09/30/2011, 11:32 AM

## 2011-09-30 NOTE — Procedures (Signed)
NAME:  Danielle [name], Danielle [name]                ACCOUNT NO.:  [id number]  MEDICAL RECORD NO.:  [id number]          PATIENT TYPE:  OUT  LOCATION:  SLEEP CENTER                 FACILITY:  [facility]  PHYSICIAN:  Barbaraann Share, MD,FCCPDATE OF BIRTH:  05/08/1968  DATE OF STUDY:  09/18/2011                           NOCTURNAL POLYSOMNOGRAM  REFERRING PHYSICIAN:  Barbaraann Share, MD,FCCP  REFERRING PHYSICIAN:  Barbaraann Share, MD,FCCP  INDICATION FOR STUDY:  Hypersomnia with sleep apnea.  EPWORTH SLEEPINESS SCORE:  9.  SLEEP ARCHITECTURE:  The patient had a total sleep time of 447 minutes with no slow wave sleep and only 48 minutes of REM.  Sleep onset latency was normal at 7 minutes and REM onset was prolonged at 142 minutes. Sleep efficiency was excellent at 96%.  RESPIRATORY DATA:  The patient was found to have 48 apneas and 47 obstructive hypopneas, giving her an apnea/hypopnea index of 13 events per hour.  It should be noted the patient had no supine sleep during the night and all of her events occurred in the lateral position.  There was moderate snoring noted throughout.  OXYGEN DATA:  There was O2 desaturation as low as 82% with the patient's obstructive events.  CARDIAC DATA:  No clinically significant arrhythmias were seen.  MOVEMENT/PARASOMNIA:  The patient had no significant leg jerks or other abnormal behaviors noted.  IMPRESSION/RECOMMENDATION:  Mild obstructive sleep apnea/hypopnea syndrome with an AHI of 13 events per hour and O2 desaturation as low as 82%.  It should be noted the patient had no supine sleep of significance, and therefore her degree of sleep apnea may be underestimated.  Treatment for this degree of sleep apnea can include a trial of weight loss alone, upper airway surgery, dental appliance, and also CPAP.  Clinical correlation is suggested.     Barbaraann Share, MD,FCCP Diplomate, American Board of Sleep Medicine   [facility]/MEDQ  D:  09/30/2011 14:07:18  T:   09/30/2011 20:24:50  Job:  [id number]

## 2011-09-30 NOTE — Tx Team (Signed)
Interdisciplinary Treatment Plan Update (Adult)  Date:  09/30/2011  Time Reviewed:  10:15AM-11:15AM  Progress in Treatment: Attending groups:  Yes Participating in groups:    Yes, fully engaged Taking medication as prescribed:    Yes Tolerating medication:   Yes, no side effects Family/Significant other contact made:  Yes Patient understands diagnosis:   Yes Discussing patient identified problems/goals with staff:   Yes Medical problems stabilized or resolved:   Yes Denies suicidal/homicidal ideation:  Yes Issues/concerns per patient self-inventory:   None Other:    New problem(s) identified: No, Describe:    Reason for Continuation of Hospitalization: None  Interventions implemented related to continuation of hospitalization:  Medication monitoring and adjustment, safety checks Q15 min., suicide risk assessment, group therapy, psychoeducation, collateral contact, aftercare planning, ongoing physician assessments, medication education - UNTIL DISCHARGE  Additional comments:  No appointment has yet been confirmed due to holiday.  Patient has provided follow-up telephone number to Case Manager and will await phone call re appointments on Tuesday.  Estimated length of stay:  Discharge today  Discharge Plan:  Go home with husband.  Follow up to be scheduled with [name] until [employer] takes over.    New goal(s):  Not applicable  Review of initial/current patient goals per problem list:   1.  Goal(s):  Improve sleep and appetite to normal pattern.  Met:  Yes  Target date:  By Discharge   As evidenced by:  Patient is on a CPAP machine here. She has gone through a sleep study and should be referred to [facility] for follow-up with a CPAP of her own. We believe this will help keep her symptoms at bay.  2.  Goal(s):  Reduce depression from 5/20 level of "8" to no greater than 3 at discharge.  Met:  Yes  Target date:  By Discharge   As evidenced by:  2-3  today  3.  Goal(s):  Reduce anxiety from 5/20 level of "8" to no greater than 3 at discharge.  Met:  Yes  Target date:  By Discharge   As evidenced by:  2-3 today  4.  Goal(s):  Reduce hopelessness from 5/20 level of "8" to no greater than 3 at discharge.  Met:  Yes  Target date:  By Discharge   As evidenced by:  2-3 today  5. Goal(s): Reduce hallucinations, including tactile and auditory, to baseline per patient and husband.  Met: Yes  Target date: By Discharge  As evidenced by:  "Please Lord don't let them come back"  6. Goal(s): Medication stabilization  Met: Yes Target date: By Discharge  As evidenced by: Stable on current meds  7. Goal(s): Follow up with [facility] to see how long before they start with patient.  Met: No  Target date: By Discharge  As evidenced by: Still needed, three messages have now been left for them, still awaiting call back.   Attendees: Patient:  Danielle [name]  09/30/2011 10:15AM-11:15AM  Family:     Physician:     Nursing:   Neill Loft, RN 09/30/2011 10:15AM -11:15AM   Case Manager:  Ambrose Mantle, LCSW 09/30/2011 10:15AM-11:15AM  Counselor:  Veto Kemps, MT-BC 09/30/2011 10:15AM-11:15AM  Other:   Verne Spurr, PA 09/30/2011 10:15AM-11:15AM  Other:      Other:      Other:       Scribe for Treatment Team:   Sarina Ser, 09/30/2011, 10:15AM-11:15AM

## 2011-10-01 NOTE — Discharge Planning (Signed)
Case Manager called [name] to set follow-up appointment.  Apparently Dr. [name] does her own scheduling.  The receptionist wanted the doctor to call patient's husband with the follow-up appointment.  CM gave details about dates of patient's hospitalization.  Case Manager also called and left a 5th message for [facility].  Ambrose Mantle, LCSW 10/01/2011, 3:29 PM

## 2011-10-02 ENCOUNTER — Telehealth: Payer: Self-pay | Admitting: Pulmonary Disease

## 2011-10-02 NOTE — Progress Notes (Signed)
Patient Discharge Instructions: No consent for the [employer], or [employer].  Wandra Scot, 10/02/2011, 3:20 PM

## 2011-10-02 NOTE — Telephone Encounter (Signed)
I left message for patient to let him know that Danielle [name] will need to advise when to have patient come in for OV to review results. KC please advise. Thanks.

## 2011-10-02 NOTE — Telephone Encounter (Signed)
Study has been read. She needs ov to discuss

## 2011-10-03 NOTE — Telephone Encounter (Signed)
Pt is scheduled for 10/04/11 @ 4:30 to discuss sleep results with KC.

## 2011-10-04 ENCOUNTER — Encounter: Payer: Self-pay | Admitting: Pulmonary Disease

## 2011-10-04 ENCOUNTER — Ambulatory Visit (INDEPENDENT_AMBULATORY_CARE_PROVIDER_SITE_OTHER): Payer: BC Managed Care – PPO | Admitting: Pulmonary Disease

## 2011-10-04 VITALS — BP 104/70 | HR 101 | Temp 98.8°F | Ht 72.0 in | Wt 225.0 lb

## 2011-10-04 DIAGNOSIS — G4733 Obstructive sleep apnea (adult) (pediatric): Secondary | ICD-10-CM

## 2011-10-04 NOTE — Assessment & Plan Note (Signed)
The patient has mild obstructive sleep apnea by her recent study, but she is very symptomatic at night and also during the day.  She was also tried on empiric CPAP while in the hospital, and responded very well to this.  Because of the impact to her quality of life, she would like to try CPAP on a consistent basis.  We'll start her out at a moderate pressure level, and I have also stressed the importance of aggressive weight loss. I will set the patient up on cpap at a moderate pressure level to allow for desensitization, and will troubleshoot the device over the next 4-6weeks if needed.  The pt is to call me if having issues with tolerance.  Will then optimize the pressure once patient is able to wear cpap on a consistent basis.

## 2011-10-04 NOTE — Patient Instructions (Signed)
Will start on cpap at a moderate pressure level.  Please call if having issues with tolerance Work on weight loss followup with me in 6weeks.  

## 2011-10-04 NOTE — Progress Notes (Signed)
  Subjective:    Patient ID: Danielle [name], female    DOB: 24-Nov-1970, 41 y.o.   MRN: [id number]  HPI The patient comes in today for followup after her recent sleep study.  She was found to have mild obstructive sleep apnea, with an AHI of 13 events per hour.  I have reviewed the study with her in detail, and answered all of her questions.   Review of Systems  Constitutional: Negative.  Negative for fever and unexpected weight change.  HENT: Negative.  Negative for ear pain, nosebleeds, congestion, sore throat, rhinorrhea, sneezing, trouble swallowing, dental problem, postnasal drip and sinus pressure.   Eyes: Negative.  Negative for redness and itching.  Respiratory: Positive for shortness of breath. Negative for cough, chest tightness and wheezing.   Cardiovascular: Negative.  Negative for palpitations and leg swelling.  Gastrointestinal: Negative.  Negative for nausea and vomiting.  Genitourinary: Negative.  Negative for dysuria.  Musculoskeletal: Negative.  Negative for joint swelling.  Skin: Negative.  Negative for rash.  Neurological: Negative.  Negative for headaches.  Hematological: Bruises/bleeds easily.  Psychiatric/Behavioral: Negative for dysphoric mood. The patient is nervous/anxious.        Objective:   Physical Exam Obese female in no acute distress Nose without purulence or discharge noted Lower extremities with mild edema, no cyanosis Alert and oriented, moves all 4 extremities.       Assessment & Plan:

## 2011-10-11 ENCOUNTER — Encounter: Payer: Self-pay | Admitting: Pulmonary Disease

## 2011-11-14 ENCOUNTER — Other Ambulatory Visit (HOSPITAL_COMMUNITY): Payer: Self-pay | Admitting: Physician Assistant

## 2011-11-22 ENCOUNTER — Ambulatory Visit: Payer: Self-pay | Admitting: Pulmonary Disease

## 2012-02-04 ENCOUNTER — Encounter (HOSPITAL_COMMUNITY): Payer: Self-pay | Admitting: *Deleted

## 2012-02-04 ENCOUNTER — Emergency Department (HOSPITAL_COMMUNITY)
Admission: EM | Admit: 2012-02-04 | Discharge: 2012-02-04 | Disposition: A | Discharge status: Other Acute Inpatient Hospital | Payer: BC Managed Care – PPO | Attending: Emergency Medicine | Admitting: Emergency Medicine

## 2012-02-04 ENCOUNTER — Inpatient Hospital Stay (HOSPITAL_COMMUNITY)
Admission: AD | Admit: 2012-02-04 | Discharge: 2012-02-17 | DRG: 430 | Disposition: A | Discharge status: Home / Self Care | Payer: BC Managed Care – PPO | Source: Ambulatory Visit | Attending: Psychiatry | Admitting: Psychiatry

## 2012-02-04 ENCOUNTER — Encounter (HOSPITAL_COMMUNITY): Payer: Self-pay | Admitting: Emergency Medicine

## 2012-02-04 DIAGNOSIS — F250 Schizoaffective disorder, bipolar type: Secondary | ICD-10-CM | POA: Diagnosis present

## 2012-02-04 DIAGNOSIS — F339 Major depressive disorder, recurrent, unspecified: Secondary | ICD-10-CM

## 2012-02-04 DIAGNOSIS — Z79899 Other long term (current) drug therapy: Secondary | ICD-10-CM

## 2012-02-04 DIAGNOSIS — B372 Candidiasis of skin and nail: Secondary | ICD-10-CM

## 2012-02-04 DIAGNOSIS — F4001 Agoraphobia with panic disorder: Secondary | ICD-10-CM | POA: Diagnosis present

## 2012-02-04 DIAGNOSIS — G43909 Migraine, unspecified, not intractable, without status migrainosus: Secondary | ICD-10-CM

## 2012-02-04 DIAGNOSIS — G4733 Obstructive sleep apnea (adult) (pediatric): Secondary | ICD-10-CM | POA: Diagnosis present

## 2012-02-04 DIAGNOSIS — R443 Hallucinations, unspecified: Secondary | ICD-10-CM | POA: Insufficient documentation

## 2012-02-04 DIAGNOSIS — E039 Hypothyroidism, unspecified: Secondary | ICD-10-CM | POA: Diagnosis present

## 2012-02-04 DIAGNOSIS — R7989 Other specified abnormal findings of blood chemistry: Secondary | ICD-10-CM

## 2012-02-04 DIAGNOSIS — R51 Headache: Secondary | ICD-10-CM | POA: Diagnosis not present

## 2012-02-04 DIAGNOSIS — F4310 Post-traumatic stress disorder, unspecified: Secondary | ICD-10-CM | POA: Diagnosis present

## 2012-02-04 DIAGNOSIS — F332 Major depressive disorder, recurrent severe without psychotic features: Secondary | ICD-10-CM | POA: Diagnosis present

## 2012-02-04 DIAGNOSIS — F29 Unspecified psychosis not due to a substance or known physiological condition: Secondary | ICD-10-CM

## 2012-02-04 DIAGNOSIS — F200 Paranoid schizophrenia: Secondary | ICD-10-CM

## 2012-02-04 DIAGNOSIS — R011 Cardiac murmur, unspecified: Secondary | ICD-10-CM | POA: Diagnosis present

## 2012-02-04 DIAGNOSIS — R45851 Suicidal ideations: Secondary | ICD-10-CM | POA: Insufficient documentation

## 2012-02-04 DIAGNOSIS — F411 Generalized anxiety disorder: Secondary | ICD-10-CM | POA: Diagnosis present

## 2012-02-04 HISTORY — DX: Shortness of breath: R06.02

## 2012-02-04 LAB — COMPREHENSIVE METABOLIC PANEL
ALT: 52 U/L — ABNORMAL HIGH (ref 0–35)
Calcium: 9.6 mg/dL (ref 8.4–10.5)
Creatinine, Ser: 0.84 mg/dL (ref 0.50–1.10)
GFR calc Af Amer: >90 mL/min (ref >90)
GFR calc non Af Amer: 84 mL/min — ABNORMAL LOW (ref >90)
Glucose, Bld: 120 mg/dL — ABNORMAL HIGH (ref 70–99)
Sodium: 136 mEq/L (ref 135–145)
Total Protein: 7 g/dL (ref 6.0–8.3)

## 2012-02-04 LAB — RAPID URINE DRUG SCREEN, HOSP PERFORMED
Barbiturates: NOT DETECTED
Benzodiazepines: NOT DETECTED
Cocaine: NOT DETECTED
Opiates: NOT DETECTED

## 2012-02-04 LAB — CBC
MCH: 30.8 pg (ref 26.0–34.0)
MCHC: 33.2 g/dL (ref 30.0–36.0)
MCV: 92.7 fL (ref 78.0–100.0)
Platelets: 231 10*3/uL (ref 150–400)

## 2012-02-04 LAB — ACETAMINOPHEN LEVEL: Acetaminophen (Tylenol), Serum: <15 ug/mL (ref 10–30)

## 2012-02-04 MED ORDER — PROCHLORPERAZINE MALEATE 10 MG PO TABS
10.0000 mg | ORAL_TABLET | Freq: Once | ORAL | Status: AC
  Administered 2012-02-04: 10 mg via ORAL
  Filled 2012-02-04: qty 1

## 2012-02-04 MED ORDER — LEVOTHYROXINE SODIUM 50 MCG PO TABS
50.0000 ug | ORAL_TABLET | Freq: Every day | ORAL | Status: DC
  Filled 2012-02-04: qty 1

## 2012-02-04 MED ORDER — CLONAZEPAM 1 MG PO TABS: 1.0000 mg | ORAL_TABLET | Freq: Every day | ORAL | Status: DC

## 2012-02-04 MED ORDER — ONDANSETRON HCL 4 MG PO TABS: 4.0000 mg | ORAL_TABLET | Freq: Three times a day (TID) | ORAL | Status: DC | PRN

## 2012-02-04 MED ORDER — ALUM & MAG HYDROXIDE-SIMETH 200-200-20 MG/5ML PO SUSP: 30.0000 mL | ORAL | Status: DC | PRN

## 2012-02-04 MED ORDER — AMITRIPTYLINE HCL 50 MG PO TABS
50.0000 mg | ORAL_TABLET | Freq: Every day | ORAL | Status: DC
  Administered 2012-02-04 – 2012-02-16 (×13): 50 mg via ORAL
  Filled 2012-02-04 (×15): qty 1
  Filled 2012-02-04: qty 2

## 2012-02-04 MED ORDER — ACETAMINOPHEN 325 MG PO TABS: 650.0000 mg | ORAL_TABLET | ORAL | Status: DC | PRN

## 2012-02-04 MED ORDER — LEVOTHYROXINE SODIUM 50 MCG PO TABS
50.0000 ug | ORAL_TABLET | Freq: Every day | ORAL | Status: DC
  Administered 2012-02-05 – 2012-02-11 (×7): 50 ug via ORAL
  Filled 2012-02-04 (×9): qty 1

## 2012-02-04 MED ORDER — DIPHENHYDRAMINE HCL 25 MG PO CAPS
25.0000 mg | ORAL_CAPSULE | Freq: Once | ORAL | Status: AC
  Administered 2012-02-04: 25 mg via ORAL
  Filled 2012-02-04: qty 1

## 2012-02-04 MED ORDER — LAMOTRIGINE 100 MG PO TABS
150.0000 mg | ORAL_TABLET | Freq: Every day | ORAL | Status: DC
  Administered 2012-02-04 – 2012-02-16 (×13): 150 mg via ORAL
  Filled 2012-02-04 (×5): qty 1.5
  Filled 2012-02-04: qty 2
  Filled 2012-02-04 (×10): qty 1.5

## 2012-02-04 MED ORDER — LAMOTRIGINE 150 MG PO TABS
150.0000 mg | ORAL_TABLET | Freq: Every day | ORAL | Status: DC
  Filled 2012-02-04: qty 1

## 2012-02-04 MED ORDER — NAPROXEN 500 MG PO TABS
250.0000 mg | ORAL_TABLET | Freq: Two times a day (BID) | ORAL | Status: DC | PRN
  Administered 2012-02-13: 250 mg via ORAL
  Filled 2012-02-04: qty 1

## 2012-02-04 MED ORDER — LORAZEPAM 1 MG PO TABS: 1.0000 mg | ORAL_TABLET | Freq: Three times a day (TID) | ORAL | Status: DC | PRN

## 2012-02-04 MED ORDER — MODAFINIL 200 MG PO TABS: 200.0000 mg | ORAL_TABLET | Freq: Every morning | ORAL | Status: DC

## 2012-02-04 MED ORDER — AMITRIPTYLINE HCL 25 MG PO TABS: 75.0000 mg | ORAL_TABLET | Freq: Every day | ORAL | Status: DC

## 2012-02-04 MED ORDER — SERTRALINE HCL 50 MG PO TABS: 150.0000 mg | ORAL_TABLET | Freq: Every day | ORAL | Status: DC

## 2012-02-04 MED ORDER — ZOLPIDEM TARTRATE 5 MG PO TABS: 5.0000 mg | ORAL_TABLET | Freq: Every evening | ORAL | Status: DC | PRN

## 2012-02-04 MED ORDER — SERTRALINE HCL 50 MG PO TABS
150.0000 mg | ORAL_TABLET | Freq: Every day | ORAL | Status: DC
  Administered 2012-02-05 – 2012-02-11 (×7): 150 mg via ORAL
  Filled 2012-02-04 (×9): qty 1

## 2012-02-04 MED ORDER — RISPERIDONE 2 MG PO TABS: 3.0000 mg | ORAL_TABLET | Freq: Two times a day (BID) | ORAL | Status: DC

## 2012-02-04 MED ORDER — DIVALPROEX SODIUM ER 500 MG PO TB24
1000.0000 mg | ORAL_TABLET | Freq: Every day | ORAL | Status: DC
  Filled 2012-02-04 (×2): qty 2

## 2012-02-04 MED ORDER — RISPERIDONE 2 MG PO TABS
2.0000 mg | ORAL_TABLET | Freq: Three times a day (TID) | ORAL | Status: DC
  Administered 2012-02-04 – 2012-02-05 (×2): 2 mg via ORAL
  Filled 2012-02-04 (×7): qty 1

## 2012-02-04 MED ORDER — BENZTROPINE MESYLATE 1 MG PO TABS
2.0000 mg | ORAL_TABLET | Freq: Two times a day (BID) | ORAL | Status: DC
  Administered 2012-02-04 – 2012-02-05 (×2): 2 mg via ORAL
  Filled 2012-02-04 (×6): qty 2

## 2012-02-04 MED ORDER — ACETAMINOPHEN 325 MG PO TABS
650.0000 mg | ORAL_TABLET | Freq: Four times a day (QID) | ORAL | Status: DC | PRN
  Administered 2012-02-05: 650 mg via ORAL

## 2012-02-04 MED ORDER — MAGNESIUM HYDROXIDE 400 MG/5ML PO SUSP: 30.0000 mL | Freq: Every day | ORAL | Status: DC | PRN

## 2012-02-04 MED ORDER — IBUPROFEN 600 MG PO TABS: 600.0000 mg | ORAL_TABLET | Freq: Three times a day (TID) | ORAL | Status: DC | PRN

## 2012-02-04 MED ORDER — DIVALPROEX SODIUM ER 500 MG PO TB24
1000.0000 mg | ORAL_TABLET | Freq: Two times a day (BID) | ORAL | Status: DC
  Filled 2012-02-04 (×2): qty 2

## 2012-02-04 MED ORDER — NYSTATIN 100000 UNIT/GM EX CREA
TOPICAL_CREAM | Freq: Two times a day (BID) | CUTANEOUS | Status: DC
  Administered 2012-02-04: 16:00:00 via TOPICAL
  Filled 2012-02-04: qty 15

## 2012-02-04 MED ORDER — BENZTROPINE MESYLATE 1 MG PO TABS: 2.0000 mg | ORAL_TABLET | ORAL | Status: DC

## 2012-02-04 MED ORDER — RISPERIDONE 2 MG PO TABS
2.0000 mg | ORAL_TABLET | Freq: Two times a day (BID) | ORAL | Status: DC
  Administered 2012-02-04: 2 mg via ORAL
  Filled 2012-02-04: qty 1

## 2012-02-04 MED ORDER — NICOTINE 21 MG/24HR TD PT24: 21.0000 mg | MEDICATED_PATCH | Freq: Every day | TRANSDERMAL | Status: DC

## 2012-02-04 NOTE — BH Assessment (Signed)
Assessment Note   Danielle [name] is an 41 y.o. female. Initially presenting with psychosis, VH of monsters coming out of the wall and AH of hearing her mother's voice with command to hurt herself. Pt endorses SI with no specific plan, but feels she needs to do this for the benefit of her husband. Pt stated "He takes care of me. It is too much for him. He doesn't deserve this. I love him so much." Pt saw her psychiatrist (Dr. Jennette Kettle in [facility]) today who recommended IPT. Pt stated when the seasons change, she goes through something like this and has to have her medications changed/adjusted. Pt continues to repeat "I'm so scared all the time...swelling of the ankles scared."  Pt has been having increased symptoms of psychosis over several days. Pt reported ghosts popping up from everywhere and scaring her. And  hearing voices of ghosts, saying that they are going to get her. Patient is anxious, depressed, dysphoric, worried, scared, disturbance of sleep, unable to function in her home situation. Her last hospitalization was 09/2011  27 2030. Patient has a few other psychiatric admissions to [facility] and [facility] in the past.  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 Depressed   . Seizures   . Heart murmur   . Hypothyroidism   . Anemia   . Headache   . Anxiety   . PTSD (post-traumatic stress disorder)    Axis IV: other psychosocial or environmental problems Axis V: 21-30 behavior considerably influenced by delusions or hallucinations OR serious impairment in judgment, communication OR inability to function in almost all areas  Past Medical History:  Past Medical History  Diagnosis Date  . Depressed   . Seizures   . Heart murmur   . Hypothyroidism   . Anemia   . Headache   . Anxiety   . PTSD (post-traumatic stress disorder)     Past Surgical History  Procedure Date  . Tubal  ligation 1996    Family History:  Family History  Problem Relation Age of Onset  . Adopted: Yes    Social History:  reports that she quit smoking about 4 months ago. Her smoking use included Cigarettes. She has a 45 pack-year smoking history. She has never used smokeless tobacco. She reports that she does not drink alcohol or use illicit drugs.  Additional Social History:  Alcohol / Drug Use Pain Medications: N/A Prescriptions: See PTA Listing Over the Counter: N/A History of alcohol / drug use?: No history of alcohol / drug abuse Longest period of sobriety (when/how long): N/A  CIWA: CIWA-Ar BP: 110/82 mmHg Pulse Rate: 95  COWS:    Allergies:  Allergies  Allergen Reactions  . Tomato Rash    Pt reports that she breaks out in a rash if she eats tomato based foods but can eat a whole tomato  . Imitrex (Sumatriptan Base) Hives  . Mustard Seed Rash    Also allergic to [facility] Medications:  (Not in a hospital admission)  OB/GYN Status:  No LMP recorded. Patient is postmenopausal.  General Assessment Data Location of Assessment: [facility] Living Arrangements: Spouse/significant other;Children (Husband, 2 sons & son's girlfriend) Can pt return to current living arrangement?: Yes Admission Status: Voluntary Is patient capable of signing voluntary admission?: Yes Transfer from: Acute Hospital Referral Source: Psychiatrist  Education Status Is patient currently in school?: No  Risk to self Suicidal Ideation: Yes-Currently Present Suicidal Intent: No-Not Currently/Within Last 6 Months Is patient at risk for suicide?: Yes Suicidal Plan?: No Access to Means: No What has been your use of drugs/alcohol within the last 12 months?: Pt denies Previous Attempts/Gestures: Yes How many times?: 1  Triggers for Past Attempts: Unknown Intentional Self Injurious Behavior: None Family Suicide History: Unknown Recent stressful life event(s): Recent negative physical  changes;Financial Problems;Other (Comment) (Anniversaries of death of best friend & father; son &girlfri) Persecutory voices/beliefs?: Yes Depression: Yes Depression Symptoms: Feeling worthless/self pity;Tearfulness Substance abuse history and/or treatment for substance abuse?: No Suicide prevention information given to non-admitted patients: Not applicable  Risk to Others Homicidal Ideation: No Thoughts of Harm to Others: No Current Homicidal Intent: No Current Homicidal Plan: No Access to Homicidal Means: No Identified Victim: N/A History of harm to others?: No Assessment of Violence: None Noted Violent Behavior Description: N/A Does patient have access to weapons?: No Criminal Charges Pending?: No Does patient have a court date: No  Psychosis Hallucinations: Auditory;Visual;With command (sees monsters coming out of wall; mother's voice w/ command ) Delusions: None noted  Mental Status Report Appear/Hygiene: Disheveled Eye Contact: Fair Motor Activity: Restlessness;Other (Comment) (V[employer]ilent) Speech: Rapid;Pressured;Logical/coherent Level of Consciousness: Restless (Initially screaming about the monsters) Mood: Fearful;Helpless Affect: Fearful;Frightened Anxiety Level: Moderate Thought Processes: Coherent;Relevant Judgement: Impaired Orientation: Person;Place;Time;Situation Obsessive Compulsive Thoughts/Behaviors: None  Cognitive Functioning Concentration: Decreased Memory: Recent Intact;Remote Intact IQ: Average Insight: Fair Impulse Control: Poor Appetite: Good Weight Loss: 0  Weight Gain: 12  (10-12 lbs over past month or so) Sleep: No Change Total Hours of Sleep:  (Sleeps with husband in day (he work's nights)) Vegetative Symptoms: None  ADLScreening [facility] Assessment Services) Patient's cognitive ability adequate to safely complete daily activities?: Yes Patient able to express need for assistance with ADLs?: Yes Independently performs ADLs?: Yes  (appropriate for developmental age)  Abuse/Neglect [facility]) Verbal Abuse: Denies Sexual Abuse: Denies  Prior Inpatient Therapy Prior Inpatient Therapy: Yes Prior Therapy Dates: few months ago Prior Therapy Facilty/Provider(s): [facility] Reason for Treatment: Schizophrenia  Prior Outpatient Therapy Prior Outpatient Therapy: Yes Prior Therapy Dates: Current Prior Therapy Facilty/Provider(s): Dr. Jennette Kettle in [facility] Reason for Treatment: Schizophrenia  ADL Screening (condition at time of admission) Patient's cognitive ability adequate to safely complete daily activities?: Yes Patient able to express need for assistance with ADLs?: Yes Independently performs ADLs?: Yes (appropriate for developmental age)       Abuse/Neglect Assessment (Assessment to be complete while patient is alone) Verbal Abuse: Denies Sexual Abuse: Denies Values / Beliefs Cultural Requests During Hospitalization: None Spiritual Requests During Hospitalization: None   Advance Directives (For Healthcare) Advance Directive: Patient does not have advance directive;Patient would not like information Pre-existing out of facility DNR order (yellow form or pink MOST form): No    Additional Information 1:1 In Past 12 Months?: No CIRT Risk: No Elopement Risk: No Does patient have medical clearance?: Yes     Disposition:  Disposition Disposition of Patient: Referred to [facility]; [facility])  On Site Evaluation by:   Reviewed with Physician:     Romeo Apple 02/04/2012 5:50 PM

## 2012-02-04 NOTE — ED Notes (Signed)
Pt has no belongings-pt states her husband took all of her belongings home.  Locker #43 checked-no belongings noted.

## 2012-02-04 NOTE — ED Notes (Signed)
Report to Erskine Squibb at [facility] for pt-Dr. Denton Lank made aware that pt had bed at [facility]

## 2012-02-04 NOTE — ED Notes (Signed)
Baxter Hire the social worker speaking with pt

## 2012-02-04 NOTE — Progress Notes (Signed)
CSW assisted with pt dc plans. CSW submitted clinicals to [facility], and [facility]. Per discussion with [facility], pt accepted to [facility]. CSW assisted with pt support paperwork, and faxed to [facility]. Originals given to pt RN to be taken to [facility] when pt is transported. [facility] in process of insurance pre-cert.   .No further Clinical Social Work needs, signing off.   Catha Gosselin, LCSWA  [phone] 02/04/2012 .02/04/2012 8:19pm

## 2012-02-04 NOTE — ED Notes (Signed)
Pt's psychiatrist is Dr. Karmen Stabs

## 2012-02-04 NOTE — Consult Note (Signed)
Reason for Consult: psychosis and has a history of paranoid schizophrenia Referring Physician: Dr. Phil Dopp Danielle [name] is an 41 y.o. female.  HPI: patient was seen and chart reviewed. Patient was referred to the psychiatric emergency from her primary psychiatrist office in [place],  Dr. [name]. Patient has been suffering with the increased symptoms of psychosis over several days, Patient reported ghosts popping up from everywhere and scaring her. Patient has hearing voices of ghosts, saying that  they are going to get her. Patient is anxious, depressed, dysphoric, worried, scared, disturbance of sleep, unable to function in her home situation. Patient psychiatrist felt she needed, acute psychiatric hospitalization for stabilization. Patient has been suffering with the Maj. depressive disorder, posttraumatic stress disorder and panic disorder with agoraphobia. Patient has been suffering with the seizure disorder hypothyroidism, frequent headaches, and the heart murmur. Her last hospitalization was Sep 21 2011 to make 27 2030. Patient has a few other psychiatric admissions to [facility] and [name]. Patient has past psychiatric admissions while living in [place] when she was younger. Patient said reported she was never graduated from school but completed GED. Patient urine screen was negative for drug of abuse, patient mother lives in [place] but she does not get along with her Patient lives with her husband and 2 boys, 24, and 80 years old.   Patient appeared staying in her bed covered with sheets lying down in her left side, dysphoric, anxious, scared and paranoid. Patient has no reported suicidal or homicidal ideations, intentions, or plans patient has a fair to poor insight, judgment and impulse control   Past Medical History  Diagnosis Date  . Depressed   . Seizures   . Heart murmur   . Hypothyroidism   . Anemia   . Headache   . Anxiety   . PTSD  (post-traumatic stress disorder)     Past Surgical History  Procedure Date  . Tubal ligation 1996    Family History  Problem Relation Age of Onset  . Adopted: Yes    Social History:  reports that she quit smoking about 4 months ago. Her smoking use included Cigarettes. She has a 45 pack-year smoking history. She has never used smokeless tobacco. She reports that she does not drink alcohol or use illicit drugs.  Allergies:  Allergies  Allergen Reactions  . Tomato Rash    Pt reports that she breaks out in a rash if she eats tomato based foods but can eat a whole tomato  . Imitrex (Sumatriptan Base) Hives  . Mustard Seed Rash    Also allergic to [employer]    Medications: I have reviewed the patient's current medications.  Results for orders placed during the hospital encounter of 02/04/12 (from the past 48 hour(s))  CBC     Status: Normal   Collection Time   02/04/12  1:32 PM      Component Value Range Comment   WBC 6.5  4.0 - 10.5 K/uL    RBC 3.99  3.87 - 5.11 MIL/uL    Hemoglobin 12.3  12.0 - 15.0 g/dL    HCT 16.1  09.6 - 04.5 %    MCV 92.7  78.0 - 100.0 fL    MCH 30.8  26.0 - 34.0 pg    MCHC 33.2  30.0 - 36.0 g/dL    RDW 40.9  81.1 - 91.4 %    Platelets 231  150 - 400 K/uL   COMPREHENSIVE METABOLIC PANEL  Status: Abnormal   Collection Time   02/04/12  1:32 PM      Component Value Range Comment   Sodium 136  135 - 145 mEq/L    Potassium 4.5  3.5 - 5.1 mEq/L    Chloride 100  96 - 112 mEq/L    CO2 23  19 - 32 mEq/L    Glucose, Bld 120 (*) 70 - 99 mg/dL    BUN 15  6 - 23 mg/dL    Creatinine, Ser 4.09  0.50 - 1.10 mg/dL    Calcium 9.6  8.4 - 81.1 mg/dL    Total Protein 7.0  6.0 - 8.3 g/dL    Albumin 3.3 (*) 3.5 - 5.2 g/dL    AST 51 (*) 0 - 37 U/L    ALT 52 (*) 0 - 35 U/L    Alkaline Phosphatase 62  39 - 117 U/L    Total Bilirubin 0.2 (*) 0.3 - 1.2 mg/dL    GFR calc non Af Amer 84 (*) >90 mL/min    GFR calc Af Amer >90  >90 mL/min   ETHANOL     Status: Normal    Collection Time   02/04/12  1:32 PM      Component Value Range Comment   Alcohol, Ethyl (B) <11  0 - 11 mg/dL   URINE RAPID DRUG SCREEN (HOSP PERFORMED)     Status: Normal   Collection Time   02/04/12  1:32 PM      Component Value Range Comment   Opiates NONE DETECTED  NONE DETECTED    Cocaine NONE DETECTED  NONE DETECTED    Benzodiazepines NONE DETECTED  NONE DETECTED    Amphetamines NONE DETECTED  NONE DETECTED    Tetrahydrocannabinol NONE DETECTED  NONE DETECTED    Barbiturates NONE DETECTED  NONE DETECTED   ACETAMINOPHEN LEVEL     Status: Normal   Collection Time   02/04/12  1:32 PM      Component Value Range Comment   Acetaminophen (Tylenol), Serum <15.0  10 - 30 ug/mL   SALICYLATE LEVEL     Status: Abnormal   Collection Time   02/04/12  1:32 PM      Component Value Range Comment   Salicylate Lvl <2.0 (*) 2.8 - 20.0 mg/dL     No results found.  Positive for anxiety, bad mood, depression, learning difficulty, mood swings, sleep disturbance and Auditory and visual hallucinations of ghosts Blood pressure 110/82, pulse 95, temperature 97.6 F (36.4 C), temperature source Oral, resp. rate 19, SpO2 95.00%.   Assessment/Plan: Paranoid schizophrenia with recent exacerbation. Maj. depressive disorder, recurrent. Posttraumatic stress disorder. Panic disorder with agoraphobia  Recommended acute psychiatric hospitalization for safety crisis stabilization and the appropriate medication adjustment. Recommended to increase her Risperdal to 3 mg and add Klonopin 1 mg at bedtime, and The rest of the medication as it is. Will check her Depakote level soon.  [name],JANARDHAHA R. 02/04/2012, 5:22 PM

## 2012-02-04 NOTE — ED Notes (Signed)
Awaiting disposition/EMTALA from [facility]P

## 2012-02-04 NOTE — ED Notes (Signed)
Pt speaking with husband via phone about pending transfer to [facility]

## 2012-02-04 NOTE — ED Notes (Signed)
Transported to [facility] in stable condition with security and NT

## 2012-02-04 NOTE — ED Notes (Signed)
Pt states "I've had a h/a since this a.m., I have chronic migraines, am I safe, I see faces coming out of the of the wall for 3-4 weeks, I hear voices they are just not as bad this time, the voice is my mother, she tells me to hurt myself"; when pt questioned if she had a plan, pt denied but did state "I tried to hurt myself a couple of months ago"

## 2012-02-04 NOTE — ED Notes (Signed)
Pt resting quietly in bed.  Awake and alert-pleasant affect-states she still has a migraine headache but declines any tylenol at this time.  Explained plan of care and meds due this evening.  Instructed pt to call if she changed her mind about tylenol for her headache.

## 2012-02-04 NOTE — ED Notes (Signed)
Pt states that she is seeing monsters and ghosts come out of the wall "like bop it (the game).  Pt's psychiatrist sent her here.  Pt is rocking back and forth saying, "We're not safe. We're not safe."  Pt also says that she wants to hurt herself.  Pt states that she wanted to hurt herself so that her husband could find "someone good for him".  Pt did not have a plan.

## 2012-02-04 NOTE — ED Notes (Signed)
Pt states her psychiatrist Dr. [name] is located in [place]

## 2012-02-04 NOTE — ED Provider Notes (Signed)
History     CSN: [id number]  Arrival date & time 02/04/12  1244   First MD Initiated Contact with Patient 02/04/12 1346      Chief Complaint  Patient presents with  . Hallucinations  . Suicidal    (Consider location/radiation/quality/duration/timing/severity/associated sxs/prior treatment) HPI Comments: Patient and spouse report patient has been having visual and auditory hallucinations x weeks.  Pt states she sees monsters popping in and out of the walls and does not feel safe.  Also is having auditory hallucinations - states she hears her mother's voice saying things like "you're not good enough,"  "why were you born?," and "you're useless."  States she has had thoughts of hurting herself but denies having a plan.  States she wants to hurt or kill herself so that her husband can have a better wife and be safe.  Denies HI.  Patient was seen by her psychologist today who recommended that she come here for inpatient help.  Spouse reports that patient has not been sick recently.  Both deny fevers, sore throat, cough, SOB, CP, abdominal pain, N/V/D, urinary symptoms.    Pt also notes that she is having her typical migraine headache with right temple pain, sensitivity to light and sound, and associated nausea.    Spouse also notes that migraine seems identical to her typical migraine, no concerning symptoms.    Pt also notes pruritic rash under left breast.Denies discharge.    The history is provided by the patient and the spouse.    Past Medical History  Diagnosis Date  . Depressed   . Seizures   . Heart murmur   . Hypothyroidism   . Anemia   . Headache   . Anxiety   . PTSD (post-traumatic stress disorder)     Past Surgical History  Procedure Date  . Tubal ligation 1996    Family History  Problem Relation Age of Onset  . Adopted: Yes    History  Substance Use Topics  . Smoking status: Former Smoker -- 1.5 packs/day for 30 years    Types: Cigarettes    Quit date:  09/20/2011  . Smokeless tobacco: Never Used  . Alcohol Use: No    OB History    Grav Para Term Preterm Abortions TAB SAB Ect Mult Living                  Review of Systems  Constitutional: Negative for fever and chills.  HENT: Negative for sore throat.   Respiratory: Negative for cough and shortness of breath.   Cardiovascular: Negative for chest pain.  Gastrointestinal: Negative for nausea, vomiting, abdominal pain and diarrhea.  Neurological: Positive for headaches.       Typical migraine headache  Psychiatric/Behavioral: Positive for suicidal ideas and hallucinations. The patient is nervous/anxious.     Allergies  Tomato; Imitrex; and Mustard seed  Home Medications   Current Outpatient Rx  Name Route Sig Dispense Refill  . AMITRIPTYLINE HCL 75 MG PO TABS Oral Take 75 mg by mouth at bedtime.    Marland Kitchen BENZTROPINE MESYLATE 2 MG PO TABS Oral Take 1 tablet (2 mg total) by mouth 2 (two) times daily in the am and at bedtime.. For side effects. 60 tablet 0  . DIVALPROEX SODIUM ER 500 MG PO TB24 Oral Take 1,000 mg by mouth 2 (two) times daily. For mood stability.    Marland Kitchen LAMOTRIGINE 150 MG PO TABS Oral Take 1 tablet (150 mg total) by mouth at bedtime. For mood stability  30 tablet 0  . LEVOTHYROXINE SODIUM 50 MCG PO TABS Oral Take 1 tablet (50 mcg total) by mouth daily. Thyroid Supplement.    Marland Kitchen MODAFINIL 200 MG PO TABS Oral Take 200 mg by mouth every morning.    Marland Kitchen NAPROXEN SODIUM 220 MG PO TABS Oral Take 1 tablet (220 mg total) by mouth 2 (two) times daily as needed. For pain.    Marland Kitchen RISPERIDONE 2 MG PO TABS  Take one tablet by mouth at 8am, 2pm and bedtime for mental clarity and psychosis. 90 tablet 0  . SERTRALINE HCL 100 MG PO TABS Oral Take 1.5 tablets (150 mg total) by mouth daily. For depression and anxiety. 45 tablet 0    BP 110/82  Pulse 95  Temp 97.6 F (36.4 C) (Oral)  Resp 19  SpO2 95%  Physical Exam  Nursing note and vitals reviewed. Constitutional: She appears  well-developed and well-nourished. No distress.  HENT:  Head: Normocephalic and atraumatic.  Neck: Neck supple.  Cardiovascular: Normal rate and regular rhythm.   Pulmonary/Chest: Effort normal and breath sounds normal. No respiratory distress. She has no wheezes. She has no rales.  Abdominal: Soft. She exhibits no distension. There is no tenderness. There is no rebound and no guarding.  Neurological: She is alert.  Skin: She is not diaphoretic.       Rash under left breast: white scaly/erythematous base, consistent with candidal infection    ED Course  Procedures (including critical care time)  Labs Reviewed  COMPREHENSIVE METABOLIC PANEL - Abnormal; Notable for the following:    Glucose, Bld 120 (*)     Albumin 3.3 (*)     AST 51 (*)     ALT 52 (*)     Total Bilirubin 0.2 (*)     GFR calc non Af Amer 84 (*)     All other components within normal limits  SALICYLATE LEVEL - Abnormal; Notable for the following:    Salicylate Lvl <2.0 (*)     All other components within normal limits  CBC  ETHANOL  URINE RAPID DRUG SCREEN (HOSP PERFORMED)  ACETAMINOPHEN LEVEL   No results found.   1. Psychosis   2. Yeast infection of the skin   3. Migraine headache   4. Elevated LFTs     MDM  Patient with 3 weeks of auditory and visual hallucinations, pt not feeling safe, feeling suicidal but without a plan.  Pt is voluntary.  Pt also with typical migraine headache and small area of candidal skin infection under left breast.  Pt given treatment in the ED.  Labs show mild elevation of LFTs - pt without abdominal pain.  This will need to be rechecked and followed as an outpatient.  Also with hyperglycemia.  Labs otherwise unremarkable.  Clydie Braun, ACT team, is aware of the patient and will see and assess her for placement.  Dr Denton Lank made aware of patient and assumes care at change of shift.          [place], [place] 02/04/12 1607  [place], [place] 02/04/12 [phone]

## 2012-02-04 NOTE — ED Provider Notes (Signed)
Act team indicates pt accepted to [facility], bed ready, readling  Suzi Roots, MD 02/04/12 2051

## 2012-02-04 NOTE — ED Notes (Signed)
Pt's belongings taken home by husband.  Pt has been scrubbed and wanded.

## 2012-02-04 NOTE — ED Provider Notes (Signed)
Medical screening examination/treatment/procedure(s) were performed by non-physician practitioner and as supervising physician I was immediately available for consultation/collaboration.  Asael Pann, MD 02/04/12 1631 

## 2012-02-05 DIAGNOSIS — F4001 Agoraphobia with panic disorder: Secondary | ICD-10-CM

## 2012-02-05 DIAGNOSIS — F332 Major depressive disorder, recurrent severe without psychotic features: Secondary | ICD-10-CM

## 2012-02-05 DIAGNOSIS — F200 Paranoid schizophrenia: Secondary | ICD-10-CM

## 2012-02-05 DIAGNOSIS — F4310 Post-traumatic stress disorder, unspecified: Secondary | ICD-10-CM

## 2012-02-05 MED ORDER — MODAFINIL 200 MG PO TABS
200.0000 mg | ORAL_TABLET | Freq: Every day | ORAL | Status: DC
  Administered 2012-02-06 – 2012-02-11 (×6): 200 mg via ORAL
  Filled 2012-02-05 (×6): qty 1

## 2012-02-05 MED ORDER — MAGNESIUM HYDROXIDE 400 MG/5ML PO SUSP: 30.0000 mL | Freq: Every day | ORAL | Status: DC | PRN

## 2012-02-05 MED ORDER — BENZTROPINE MESYLATE 2 MG PO TABS
2.0000 mg | ORAL_TABLET | ORAL | Status: DC
  Administered 2012-02-05 – 2012-02-17 (×24): 2 mg via ORAL
  Filled 2012-02-05 (×28): qty 1

## 2012-02-05 MED ORDER — RISPERIDONE 2 MG PO TABS
2.0000 mg | ORAL_TABLET | ORAL | Status: DC
  Administered 2012-02-05 – 2012-02-09 (×12): 2 mg via ORAL
  Filled 2012-02-05 (×18): qty 1

## 2012-02-05 MED ORDER — ALUM & MAG HYDROXIDE-SIMETH 200-200-20 MG/5ML PO SUSP: 30.0000 mL | ORAL | Status: DC | PRN

## 2012-02-05 MED ORDER — DIVALPROEX SODIUM ER 500 MG PO TB24
1500.0000 mg | ORAL_TABLET | Freq: Every day | ORAL | Status: DC
  Administered 2012-02-05 – 2012-02-09 (×5): 1500 mg via ORAL
  Filled 2012-02-05 (×6): qty 3

## 2012-02-05 NOTE — Progress Notes (Signed)
D: Patient having passive SI but agrees to contract for safety. The patient is also having auditory and visual hallucinations. The patient states she is seeing "monsters on the walls" and that she "can't make out what the voices are saying." The patient has an anxious mood and affect. The patient rates her depression and hopelessness both an 8 out of 10 (1 low/10 high). The patient reports sleeping poorly and states that, while her appetite is good, she has a low energy level. The patient prefers to isolate to her room throughout the day and states that staying in bed, with the covers pulled over her, helps decrease the voices.  A: Patient given emotional support from RN. Patient encouraged to come to staff with concerns and/or questions. Patient's medication routine continued. Patient's orders and plan of care reviewed.  R: Patient remains appropriate and cooperative. Will continue to monitor patient q15 minutes for safety.

## 2012-02-05 NOTE — [facility] Suicide Risk Assessment (Signed)
Suicide Risk Assessment  Admission Assessment     Nursing information obtained from:  Patient Demographic factors:  Caucasian;Low socioeconomic status;Unemployed Current Mental Status:   (currently denies) Loss Factors:  NA Historical Factors:  Prior suicide attempts;Family history of mental illness or substance abuse Risk Reduction Factors:  Responsible for children under 43 years of age;Sense of responsibility to family;Living with another person, especially a relative;Positive social support  CLINICAL FACTORS:   Severe Anxiety and/or Agitation Panic Attacks Depression:   Anhedonia Hopelessness Insomnia Severe More than one psychiatric diagnosis Currently Psychotic Unstable or Poor Therapeutic Relationship Previous Psychiatric Diagnoses and Treatments Medical Diagnoses and Treatments/Surgeries  COGNITIVE FEATURES THAT CONTRIBUTE TO RISK:  Thought constriction (tunnel vision)    Diagnosis:  Axis I: Schizophrenia - Paranoid Type.  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  Posttraumatic Stress Disorder.  Panic Disorder with Agoraphobia.   The patient was seen today and reports the following:   ADL's: Intact.  Sleep: The patient reports to having significant difficulty initiating and maintaining sleep.  Appetite: The patient reports a poor appetite today.   Mild>(1-10) >Severe  Hopelessness (1-10): 8  Depression (1-10): 8  Anxiety (1-10): 8   Suicidal Ideation: The patient reports suicidal ideations today but without plan or intent.  Plan: No  Intent: No  Means: No   Homicidal Ideation: The patient adamantly denies any homicidal ideations today.  Plan: No  Intent: No.  Means: No   General Appearance/Behavior: Appropriate and cooperative today but appears significantly depressed and anxious. Eye Contact: Good.  Speech: Appropriate in rate and volume today with no pressuring noted.  Motor Behavior: wnl.  Level of Consciousness: Alert and Oriented x 3.    Mental Status: Alert and Oriented x 3.  Mood: Severely depressed.  Affect: Severely constricted.  Anxiety Level: Severe anxiety reported.  Thought Process: Reports both auditory and visual hallucinations.  Thought Content: The patient reports auditory hallucinations instructing her to harm herself. She reports to seeing "monsters jumping out of the wall."  She denies any delusional thinking today.  Perception:. Auditory and visual hallucinations continue.  Judgment: Fair.  Insight: Fair.  Cognition: Oriented to person, place and time.   Current Medications:    . amitriptyline  50 mg Oral QHS  . benztropine  2 mg Oral BH-qamhs  . divalproex  1,500 mg Oral Daily  . lamoTRIgine  150 mg Oral QHS  . levothyroxine  50 mcg Oral QAC breakfast  . modafinil  200 mg Oral Q breakfast  . risperiDONE  2 mg Oral [id number]  . sertraline  150 mg Oral Daily  . DISCONTD: benztropine  2 mg Oral BID  . DISCONTD: divalproex  1,000 mg Oral Daily  . DISCONTD: risperiDONE  2 mg Oral TID   The patient was seen today and reports to having significant depressive and psychotic symptoms. She reports both auditory and visual hallucinations which have been under poor control for the last 3 weeks. She reports to hearing voices instructing her to harm herself as well as seeing "monsters jumping out the wall" at her.  The patient reports difficulty initiating and maintaining sleep and reports a poor appetite.  She also reports severe feelings of sadness, anhedonia and depressed mood.  She also reports suicidal ideations today but with no plan or intent and denies any homicidal ideations.  The patient also reports severe anxiety today.  She reports prior trials of Geodon, Zyprexa and Seroquel and responded well to Risperdal at her last hospitalization.  She  presents today for evaluation and treatment of the above symptoms.   Treatment Plan Summary:  1. Daily contact with patient to assess and evaluate symptoms and  progress in treatment  2. Medication management  3. The patient will deny suicidal ideations or homicidal ideations for 48 hours prior to discharge and have a depression and anxiety rating of 3 or less. The patient will also deny any auditory or visual hallucinations or delusional thinking.   Plan:  1. Will continue the medication Elavil at 50 mgs po qhs for sleep and depression.  2. Will continue the medication Cogentin to 2 mg po qam and hs for EPS.  3. Will continue the medication Depakote ER at 1500 mgs po qhs for mood stabilization.  4. Will continue the medication Lamictal at 150 mgs po qhs for mood stabilization.  5. Will continue the medication Synthroid at 50 mcg po qam for hypothyroidism. 6. Will continue the medication Modafinil at 200 mgs po qam for excessive daytime sedation related to OSA.  7. Will continue the medication Risperdal at 2 mgs po q am, 2 pm and hs for psychosis, anxiety and mood stabilization.  8. Will continue the medication Zoloft 150 mgs po q am for depression and anxiety.  9. Laboratory studies reviewed.  10. Will order a TSH, Free T3, Free T4 and Serum Depakote Level for this evening. 11. Will order an EKG to evaluate QTc. 12. Will continue to monitor.   SUICIDE RISK:  Moderate:  Frequent suicidal ideation with limited intensity, and duration, some specificity in terms of plans, no associated intent, good self-control, limited dysphoria/symptomatology, some risk factors present, and identifiable protective factors, including available and accessible social support.  Danielle [name] 02/05/2012, 1:41 PM

## 2012-02-05 NOTE — Progress Notes (Signed)
Patient ID: Danielle [name], female   DOB: Nov 09, 1970, 41 y.o.   MRN: [id number] D: Pt. Lying in bed, alert, but suspicious, having AVH. "I see monsters, they all on the wall everywhere." "I don't feel safe". A: Writer assured client that she was safe and staff will be on the hall all night to check on her. Staff will monitor q16min for safety. Pt. Encouraged to go to group. Writer provided emotional support, listening and reassuring. R: Pt. Is safe on the unit. Pt. Attends group.

## 2012-02-05 NOTE — H&P (Signed)
Psychiatric Admission Assessment Adult  Patient Identification:  Danielle [name] Date of Evaluation:  02/05/2012 Chief Complaint:  Schizophrenia MDD Panic DO History of Present Illness:  Patient Danielle S. States is a 41 yr old white female presenting from [facility].  Patient states that she was taken to hospital ED by her husband because she was seeing ghost and hearing her mothers voice.  Patient described ghost jumping out of wall as "You know when you see the bop-it little frog game; you know you have to try to hit them when they pop out of the hole.  The ghost are jumping out and trying to hurt me.  I don't feel safe.  Patient also admits to hearing voices.  "I hear my mother's voice.  She is telling me I am worthless, no good, and shouldn't be a live."  Patient is actively seeing ghost in her room during interview stating "They are going to get me, I'm scared, please help me."  Patient states that she has a history of seeing things and hearing voices.    Mood Symptoms:  Appetite, Concentration, Depression, Helplessness, Hopelessness, Sadness, SI, Sleep, Worthlessness, Depression Symptoms:  depressed mood, feelings of worthlessness/guilt, difficulty concentrating, hopelessness, suicidal thoughts without plan, anxiety, disturbed sleep, decreased appetite, Rates depression 8/10 (Hypo) Manic Symptoms:  Distractibility, Hallucinations, Anxiety Symptoms:  Agoraphobia, Panic Symptoms, Psychotic Symptoms:  Hallucinations: Auditory Visual Patient is seeing ghost jumping from walls trying to hurt her and is hearing her mothers voice. Paranoia,  PTSD Symptoms: Hx of PTSD.  Not noted at this time.  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   Hospitalizations: Has had multiple  hospitalization.  Last 09/2011 [facility]  Outpatient Care: Dr. Jennette Kettle in [facility]  Substance Abuse Care: None.  Denies substance abuse  Self-Mutilation:  Denies  Suicidal Attempts:  X 2 by cutting wrist.  Patient could not remember when last attempt occurred.  States that she was hospitalized r/t last attempt.  Violent Behaviors:  Denies   Past Medical History:   Past Medical History  Diagnosis Date  . Depressed   . Seizures   . Heart murmur   . Hypothyroidism   . Anemia   . Headache   . Anxiety   . PTSD (post-traumatic stress disorder)   . Shortness of breath    See above for past medical history.  Patient states that she has a thyroid problem that she is taking medication for daily.  Also states long standing depression, anxiety and mental  Illness.  Allergies:   Allergies  Allergen Reactions  . Tomato Rash    Pt reports that she breaks out in a rash if she eats tomato based foods but can eat a whole tomato  . Imitrex (Sumatriptan [employer]) Hives  . Mustard Seed Rash    Also allergic to [facility]   Patient states that she breaks out in a rash if she eats mustard, ketchup, and mayonnaise.  Also states that she is allergic to Imitrex (rash).  PTA Medications: Prescriptions prior to admission  Medication Sig Dispense Refill  . amitriptyline (ELAVIL) 75 MG tablet Take 75 mg by mouth at bedtime.      . benztropine (COGENTIN) 2 MG tablet Take 1 tablet (2 mg total) by mouth 2 (two) times daily in the am and at bedtime.. For side effects.  60 tablet  0  . divalproex (DEPAKOTE  ER) 500 MG 24 hr tablet Take 1,000 mg by mouth 2 (two) times daily. For mood stability.      Marland Kitchen lamoTRIgine (LAMICTAL) 150 MG tablet Take 1 tablet (150 mg total) by mouth at bedtime. For mood stability  30 tablet  0  . levothyroxine (SYNTHROID, LEVOTHROID) 50 MCG tablet Take 1 tablet (50 mcg total) by mouth daily. Thyroid Supplement.      . modafinil (PROVIGIL) 200 MG tablet Take 200 mg by mouth every morning.      .  naproxen sodium (ANAPROX) 220 MG tablet Take 1 tablet (220 mg total) by mouth 2 (two) times daily as needed. For pain.      Marland Kitchen risperiDONE (RISPERDAL) 2 MG tablet Take 2 mg by mouth See admin instructions. Take one tablet by mouth at 8am, 2pm and bedtime for mental clarity and psychosis.      Marland Kitchen sertraline (ZOLOFT) 100 MG tablet Take 1.5 tablets (150 mg total) by mouth daily. For depression and anxiety.  45 tablet  0  . DISCONTD: risperiDONE (RISPERDAL) 2 MG tablet Take one tablet by mouth at 8am, 2pm and bedtime for mental clarity and psychosis.  90 tablet  0    Previous Psychotropic Medications:  Medication/Dose  See home med list               Substance Abuse History in the last 12 months:  Denies use of cigarettes, alcohol, and other illicit drugs.  History of alcohol and drug use 1996 but stopped the same year started.  Substance Age of 1st Use Last Use Amount Specific Type  Nicotine      Alcohol      Cannabis      Opiates      Cocaine      Methamphetamines      LSD      Ecstasy      Benzodiazepines      Caffeine      Inhalants      Others:                         Consequences of Substance Abuse: Denies substance abuse. UDS negative.  Social History: Current Place of Residence:  Lives with husband in [place].  Address given [address]. Place of Birth:  Not noted Family Members: Husband and 3 children Marital Status:  Married Children:  3  Sons:  2 (22 and 14 yrs old)  Daughters:  1 (52 yrs old) Relationships: Education:  GED Educational Problems/Performance:  None noted Religious Beliefs/Practices:  None noted History of Abuse (Emotional/Phsycial/Sexual):  None noted Occupational Experiences; Military History:  None. Legal History:  None noted Hobbies/Interests:  None noted  Family History:   Family History  Problem Relation Age of Onset  . Adopted: Yes    Mental Status Examination/Evaluation: Objective:  Appearance: Disheveled and  In need of a bath.  States that she will take bath in one hour.  Eye Contact::  Fair  Speech:  Normal Rate and volume up and down.  During perds of halusanation speech loud frantic  Volume:  Decreased when not seeing ghost, Increased when ghost jumping out of walls trying to harm her  Mood:  Anxious, Depressed, Dysphoric and Hopeless and frighten  Affect:  Depressed, Tearful and afraid  Thought Process:  Circumstantial and Disorganized  Orientation:  Full  Thought Content:  Hallucinations: Auditory Visual and Paranoid Ideation  Suicidal Thoughts:  Yes.  without intent/plan  Homicidal  Thoughts:  No  Memory:  Immediate;   Fair Recent;   Fair Remote;   Fair  Judgement:  Poor  Insight:  Fair  Psychomotor Activity:  None noted  Concentration:  Poor  Recall:  Fair  Akathisia:  No  Handed:  Right  AIMS (if indicated):     Assets:  Desire for Improvement  Sleep:  Number of Hours: 6.25     Laboratory/X-Ray Psychological Evaluation(s)     ROS:   Review of Systems per ED provider Constitutional: Negative for fever and chills.  HENT: Negative for sore throat.  Respiratory: Negative for cough and shortness of breath.  Cardiovascular: Negative for chest pain.  Gastrointestinal: Negative for nausea, vomiting, abdominal pain and diarrhea.  Neurological: Positive for headaches.  Typical migraine headache  Psychiatric/Behavioral: Positive for suicidal ideas and hallucinations. The patient is nervous/anxious  Assessment:    PE:   Per ED proviser:   Physical Exam  Nursing note and vitals reviewed.  Constitutional: She appears well-developed and well-nourished. No distress.  Head: Normocephalic and atraumatic.  Neck: Neck supple.  Cardiovascular: Normal rate and regular rhythm.  Pulmonary/Chest: Effort normal and breath sounds normal. No respiratory distress. She has no wheezes. She has no rales.  Abdominal: Soft. She exhibits no distension. There is no tenderness. There is no rebound  and no guarding.  Neurological: She is alert.  Skin: She is not diaphoretic.  Rash under left breast: white scaly/erythematous base, consistent with candidal infection   AXIS I:  See current hospital problem list AXIS II:  Deferred AXIS III:   Past Medical History  Diagnosis Date  . Depressed   . Seizures   . Heart murmur   . Hypothyroidism   . Anemia   . Headache   . Anxiety   . PTSD (post-traumatic stress disorder)   . Shortness of breath    AXIS IV:  other psychosocial or environmental problems AXIS V:  21-30 behavior considerably influenced by delusions or hallucinations OR serious impairment in judgment, communication OR inability to function in almost all areas  Treatment Plan/Recommendations: Review and initiate  medications pertinent to patient illness and treatment.  Monitor patient every 15 minutes for safety.  Treat and monitor for stabilization and safety.  Treatment Plan Summary: Daily contact with patient to assess and evaluate symptoms and progress in treatment Medication management 1. Daily contact with patient to assess and evaluate symptoms and progress in treatment.  2. Medication management  3. The patient will deny suicidal ideations 48 hours prior to discharge and have a depression and anxiety rating of 3 or less. 4. The patient will also deny any auditory or visual hallucinations or delusional thinking.   Current Medications:  Current Facility-Administered Medications  Medication Dose Route Frequency Provider Last Rate Last Dose  . acetaminophen (TYLENOL) tablet 650 mg  650 mg Oral Q6H PRN Curlene Labrum Readling, MD      . alum & mag hydroxide-simeth (MAALOX/MYLANTA) 200-200-20 MG/5ML suspension 30 mL  30 mL Oral Q4H PRN Curlene Labrum Readling, MD      . amitriptyline (ELAVIL) tablet 50 mg  50 mg Oral QHS Curlene Labrum Readling, MD   50 mg at 02/04/12 2314  . benztropine (COGENTIN) tablet 2 mg  2 mg Oral BID Curlene Labrum Readling, MD   2 mg at 02/05/12 0816  . divalproex  (DEPAKOTE ER) 24 hr tablet 1,000 mg  1,000 mg Oral Daily Curlene Labrum Readling, MD      . lamoTRIgine (LAMICTAL) tablet 150  mg  150 mg Oral QHS Curlene Labrum Readling, MD   150 mg at 02/04/12 2314  . levothyroxine (SYNTHROID, LEVOTHROID) tablet 50 mcg  50 mcg Oral QAC breakfast Ronny Bacon, MD   50 mcg at 02/05/12 [phone]  . magnesium hydroxide (MILK OF MAGNESIA) suspension 30 mL  30 mL Oral Daily PRN Curlene Labrum Readling, MD      . naproxen (NAPROSYN) tablet 250 mg  250 mg Oral BID PRN Curlene Labrum Readling, MD      . risperiDONE (RISPERDAL) tablet 2 mg  2 mg Oral TID Ronny Bacon, MD   2 mg at 02/05/12 0816  . sertraline (ZOLOFT) tablet 150 mg  150 mg Oral Daily Curlene Labrum Readling, MD   150 mg at 02/05/12 0454   Facility-Administered Medications Ordered in Other Encounters  Medication Dose Route Frequency Provider Last Rate Last Dose  . diphenhydrAMINE (BENADRYL) capsule 25 mg  25 mg Oral Once [place], [place]   25 mg at 02/04/12 1547  . prochlorperazine (COMPAZINE) tablet 10 mg  10 mg Oral Once [place], PA   10 mg at 02/04/12 1547  . DISCONTD: acetaminophen (TYLENOL) tablet 650 mg  650 mg Oral Q4H PRN Trixie Dredge, PA      . DISCONTD: alum & mag hydroxide-simeth (MAALOX/MYLANTA) 200-200-20 MG/5ML suspension 30 mL  30 mL Oral PRN Trixie Dredge, PA      . DISCONTD: amitriptyline (ELAVIL) tablet 75 mg  75 mg Oral QHS Trixie Dredge, [place]      . DISCONTD: benztropine (COGENTIN) tablet 2 mg  2 mg Oral BH-qamhs [place], [place]      . DISCONTD: clonazePAM (KLONOPIN) tablet 1 mg  1 mg Oral QHS Nehemiah Settle, MD      . DISCONTD: divalproex (DEPAKOTE ER) 24 hr tablet 1,000 mg  1,000 mg Oral BID Trixie Dredge, [place]      . DISCONTD: ibuprofen (ADVIL,MOTRIN) tablet 600 mg  600 mg Oral Q8H PRN Trixie Dredge, [place]      . DISCONTD: lamoTRIgine (LAMICTAL) tablet 150 mg  150 mg Oral QHS Trixie Dredge, [place]      . DISCONTD: levothyroxine (SYNTHROID, LEVOTHROID) tablet 50 mcg  50 mcg Oral Daily [place], [place]      . DISCONTD: LORazepam  (ATIVAN) tablet 1 mg  1 mg Oral Q8H PRN Trixie Dredge, [place]      . DISCONTD: modafinil (PROVIGIL) tablet 200 mg  200 mg Oral q morning - 10a Trixie Dredge, PA      . DISCONTD: nicotine (NICODERM CQ - dosed in mg/24 hours) patch 21 mg  21 mg Transdermal Daily [place], [place]      . DISCONTD: nystatin cream (MYCOSTATIN)   Topical BID Trixie Dredge, [place]      . DISCONTD: ondansetron (ZOFRAN) tablet 4 mg  4 mg Oral Q8H PRN Trixie Dredge, [place]      . DISCONTD: risperiDONE (RISPERDAL) tablet 2 mg  2 mg Oral BID Trixie Dredge, PA   2 mg at 02/04/12 1547  . DISCONTD: risperiDONE (RISPERDAL) tablet 3 mg  3 mg Oral BID Nehemiah Settle, MD      . DISCONTD: sertraline (ZOLOFT) tablet 150 mg  150 mg Oral Daily [place], [place]      . DISCONTD: zolpidem (AMBIEN) tablet 5 mg  5 mg Oral QHS PRN Trixie Dredge, PA        Observation Level/Precautions:  Monitor patient every 15 minutes for safety  Laboratory: WBC 6.5 RBC 3.99  Hemoglobin 12.3 HCT 37.0 Platelets 231 Sodium 136 Potassium 4.5 Chloride 100 CO2 23 BUN 15 Creatinine 0.84 Calcium 9.6 Sodium 136 Potassium 4.5 Chloride 100 CO2 23 BUN 15 Creatinine, Ser 0.84 Calcium 9.6 GFR calc non Af Amer 84 GFR calc Af Amer 90 mL/min  The eGFR has been calculated using the CKD EPI equation. This calculation has not been validated in all clinical situations. eGFR's persistently <90 mL/min signify possible Chronic Kidney Disease.">9090 mL/min  The eGFR has been calculated using the CKD EPI equation. This calculation has not been validated in all clinical situations. eGFR's persistently <90 mL/min signify possible Chronic Kidney Disease." border=0 src="file://[email or web address]:/PROGRAM%[email or web address]([email or web address]/EN-US/[email or web address]" width=5 height=10 Glucose, Bld 120 Alkaline Phosphatase 62 Albumin 3.3 AST 51 ALT 52 Total Protein 7.0 Total Bilirubin 0.2   Psychotherapy:  Group therapy and activities  Medications:  See medication list  Routine PRN Medications:  Yes  Consultations:  None  indicated at this time  Discharge Concerns:  Safety, stabilization, and risk of access to medication and medication stabilization  Other:     Shuvon Rankin FNP 10/2/201310:02 AM

## 2012-02-05 NOTE — Progress Notes (Signed)
Psychoeducational Group Note  Date:  02/05/2012 Time:  2000  Group Topic/Focus:  Wrap-Up Group:   The focus of this group is to help patients review their daily goal of treatment and discuss progress on daily workbooks.  Participation Level: Did Not Attend  Participation Quality:  Not Applicable  Affect:  Not Applicable  Cognitive:  Not Applicable  Insight:  Not Applicable  Engagement in Group: Not Applicable  Additional Comments:  Pt went into group for a couple of minutes then walked back out because she said that he was not safe, staff tried to reinforce to pt that she is safe here, but pt still continued to state that she is not safe here and went back to her room.  Kaleen Odea R 02/05/2012, 9:12 PM

## 2012-02-05 NOTE — Progress Notes (Signed)
Vol admit to the 400 hall presented to the ED reporting that she was seeing monsters and ghosts coming out of the walls and hearing her mother's voice telling her to kill herself.  Pt was last here in May.  During the admission, pt was talking with staff, then suddenly looked frightened and said, "are we safe?".  Then said, "they are here, they are here."  Staff was able to reassure the pt that she was safe, and conversation continued.  Pt denied SI/HI.  Pt reports that husband is very supportive and helps her with her meds, so she has been taking her prescribed meds.  She reported a headache of 5/10.  She has a hx migraines, hypothyroidism, PTSD, seizures, heart murmur, SOB and sleep apnea.  Pt also reported that she tends to be unsteady and has fallen several times in the last 6 months. Pt was made a high fall risk.  Admission completed and pt re-oriented to unit.  Safety checks q15 minutes initiated.

## 2012-02-05 NOTE — Progress Notes (Signed)
D:  Patient in room on approach.  Patient calm and cooperative.  Patient states she has been to [facility] before.  Patient states she is tired and would like to take her night medications.  Patient denies SI and denies HI but states she hears voices and she sees monsters and figures coming from the ceiling.  Patient startles when staff walk in the room.  Patient asks several times, "Are we safe."  Patient anxious and states depression is not so much.  Patient state she has a CPAP machine at home but she has not used it for a while. A: Staff to monitor Q 15 mins for safety.  Encouragement and support offered.  Patient reassured that she will be safe in this facility.  Scheduled medications administered per orders. R: Patient remains safe on the unit.  Patient taking administered medications.  Patient in room and suspicious but cooperative and calm.

## 2012-02-05 NOTE — Tx Team (Signed)
Initial Interdisciplinary Treatment Plan  PATIENT STRENGTHS: (choose at least two) Average or above average intelligence Capable of independent living General fund of knowledge Motivation for treatment/growth Supportive family/friends  PATIENT STRESSORS: Health problems Traumatic event psychosis-A/V hallucinations   PROBLEM LIST: Problem List/Patient Goals Date to be addressed Date deferred Reason deferred Estimated date of resolution  Anxiety      Seeing monsters/ghosts coming out of the wall      Hearing her mother's voice telling her to kill herself      Seizures-last one two weeks ago      Migraines      PTSD      Hypothyroidism      SOB/sleep apnea             DISCHARGE CRITERIA:  Ability to meet basic life and health needs Improved stabilization in mood, thinking, and/or behavior Motivation to continue treatment in a less acute level of care Verbal commitment to aftercare and medication compliance  PRELIMINARY DISCHARGE PLAN: Attend aftercare/continuing care group Outpatient therapy Return to previous living arrangement  PATIENT/FAMIILY INVOLVEMENT: This treatment plan has been presented to and reviewed with the patient, Danielle [name], and/or family member.  The patient and family have been given the opportunity to ask questions and make suggestions.  [facility] 02/05/2012, 12:02 AM

## 2012-02-05 NOTE — Discharge Planning (Signed)
Pt did not attend morning aftercare planning group but came to tx team. She appeared frightened and anxious when entering room. Pt reported that she was seeing monsters coming out of the walls that were scaring her. She had been experiencing this for approx 3 weeks. Pt reported that the anniversary of her best friend and her father's death is coming up soon and has caused her distress over the past few weeks. Pt reports that she will not feel safe until she gets meds that "make the monsters go away." Pt reported her depression and anxiety at a 8, hopelessness as "yes" and reports hearing the voice of her mother. Pt can sleep but is not eating well and only feels safe when under her blankets in bed. Pt sees Edwina Barth at [facility].

## 2012-02-05 NOTE — Tx Team (Signed)
Interdisciplinary Treatment Plan Update (Adult)  Date: 02/05/2012  Time Reviewed: 10:13 AM   Progress in Treatment: Attending groups: No Participating in groups:  No Taking medication as prescribed: Yes Tolerating medication:  Yes Family/Significant othe contact made:   Patient understands diagnosis:  Yes Discussing patient identified problems/goals with staff:  Yes Medical problems stabilized or resolved:  Yes Denies suicidal/homicidal ideation: No Issues/concerns per patient self-inventory:  None identified Other: N/A  New problem(s) identified: None Identified  Reason for Continuation of Hospitalization: Anxiety Delusions  Depression Hallucinations Medication stabilization Suicidal ideation   Interventions implemented related to continuation of hospitalization: mood stabilization, medication monitoring and adjustment, group therapy and psycho education, safety checks q 15 mins  Additional comments: 02/08/12 anniversary of best friend's death and father's death on 2012/02/28.  Estimated length of stay: 3-5 days  Discharge Plan: Patient sees Dr. Jennette Kettle in [place].  New goal(s): N/A  Review of initial/current patient goals per problem list:   1.  Goal(s): Reduce depressive symptoms  Met:  No, rates depression at 8/10. Passive SI.  Target date: by discharge  As evidenced by: Reducing depression from a 10 to a 3 as reported by pt.   2.  Goal (s): Eliminate Suicidal Ideation  Met:  No, passive SI.  Target date: by discharge  As evidenced by: Eliminate suicidal ideation.   3.  Goal(s): Reduce Psychosis  Met:  No, seeing "things coming out of the walls".  Target date: by discharge  As evidenced by: Reduce psychotic symptoms to baseline, as reported by pt.     Attendees: Patient:  Danielle [name] 02/05/2012 10:09 AM   Family:     Physician:  Franchot Gallo, MD 02/05/2012 10:09 AM   Nursing:      Case Manager:  Barrie Folk RN MS EdS 02/05/2012 10:09 AM     Counselor:  Veto Kemps, MT-BC 02/05/2012 10:09 AM   Other:  Carolynn Comment RN 02/05/2012 10:09 AM   Other:  Kathlee Nations RN 02/05/2012 10:10 AM   Other:  Herbert Seta Smart LCSW 02/05/2012 10:11 AM   Other:  Nestor Ramp RN 02/05/2012 10:14 AM    Scribe for Treatment Team:   [name] Yamada 02/05/2012 10:10 AM

## 2012-02-06 LAB — T4, FREE: Free T4: 0.73 ng/dL — ABNORMAL LOW (ref 0.80–1.80)

## 2012-02-06 LAB — HEMOGLOBIN A1C: Hgb A1c MFr Bld: 5.9 % — ABNORMAL HIGH (ref <5.7)

## 2012-02-06 MED ORDER — HALOPERIDOL 1 MG PO TABS
1.0000 mg | ORAL_TABLET | Freq: Every day | ORAL | Status: DC
  Administered 2012-02-06: 1 mg via ORAL
  Filled 2012-02-06 (×3): qty 1

## 2012-02-06 NOTE — Discharge Planning (Signed)
Pt did not attend morning aftercare planning group but came to tx team. She reported sleeping better and eating well. Pt said that she is still hearing voices and seeing monsters coming out of the walls. She rates her depression as "bad" and reported that these hallucinations get worse when she is around a lot of people. Pt reports some s/i due to fear and depression. She feels safer being in her room or under her covers. Pt said that her husband left msg for doctor yesterday. Doctor to follow-up with her husband.

## 2012-02-06 NOTE — Progress Notes (Signed)
[facility] Group Notes:  (Counselor/Nursing/MHT/Case Management/Adjunct)  02/06/2012 5:21 PM  Type of Therapy:  Group Therapy  11:00, Music Therapy 1:15  Participation Level:  Did Not Attend   Danielle [name] 02/06/2012, 5:21 PM

## 2012-02-06 NOTE — Care Management (Signed)
Patient has follow-up scheduled at [facility] with Dr. Koleen Nimrod on 02/11/12 @ 1:45 PM. Joice Lofts RN MS EdS 02/06/2012  1:23 PM

## 2012-02-06 NOTE — Progress Notes (Signed)
Patient remains fearful and isolative to her room.  When she comes out to get her medications, she states, "it's not safe out here" over and over.  Patient has to be reassured and supported by staff.  She denies any SI/HI.  She states that she is hearing voices and does not feel comfortable in the day room or cafeteria with others.  She has a high state of anxiety.    Continue to monitor medication management and MD orders.  Safety checks completed every 15 minutes.  Reassure patient of her safety on the unit.  Encourage patient to come out of room periodically and possibly attend some groups.

## 2012-02-06 NOTE — Progress Notes (Signed)
[facility] In Patient Progress Note 02/06/2012 2:59 PM Zannie S Raus 1970/05/31 [id number] Hospital day #:2 Diagnosis:  Diagnosis:  Axis I: Schizophrenia - Paranoid Type.  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  Posttraumatic Stress Disorder.  Panic Disorder with Agoraphobia.    ADL's:  Intact Sleep:  No change Appetite:not normal  Groups:None  Subjective: [place] states she is too scared to go to groups or to attend meals in the cafeteria. She is reporting poor sleep.   height is 5' 8.5" (1.74 m) and weight is 112.038 kg (247 lb). Her oral temperature is 97.6 F (36.4 C). Her blood pressure is 123/84 and her pulse is 82. Her respiration is 16.   Objective: Patient continues to isolate in her room.  Ros: Constitutional: WD WN Adult in NAD ENT:      negative for runny nose, sore throat, congestion, dysphagia COR:     negative for cough, SOB, chest pain, wheezing GI:         negative for Nausea, vomiting, diarrhea, constipation, abdominal pain Neuro:  negative for dizziness, blurred vision, headaches, numbness or tingling Ortho:   negative for limb pain, swelling, change in ambulatory status.  Mental Status Exam Level of Consciousness: awake Orientation: x 3 General Appearance:  disheveled Behavior:  Anxious and frightened Eye Contact:  poor Motor Behavior:  normal Speech:  Clear voice high pitched and whiney Mood: depressed with severe anxiety Suicidal Ideation: No suicidal ideation, no plan, no intent, no means. Homicidal Ideation:  No homicidal ideation, no plan, no intent, no means.  Affect:  constricted Anxiety Level:  severe Thought Process:  disorganzied Thought Content:  Continues to report Auditory and visual hallucinations Perception:  intact Judgment:  Poor,  Insight:  Poor,  Cognition:  average Sleep:  Number of Hours: 6.75   Lab Results:  Results for orders placed during the hospital encounter of 02/04/12 (from the past 48 hour(s))  VALPROIC ACID LEVEL      Status: Abnormal   Collection Time   02/05/12  7:43 PM      Component Value Range Comment   Valproic Acid Lvl 36.3 (*) 50.0 - 100.0 ug/mL   HEMOGLOBIN A1C     Status: Abnormal   Collection Time   02/05/12  7:43 PM      Component Value Range Comment   Hemoglobin A1C 5.9 (*) <5.7 %    Mean Plasma Glucose 123 (*) <117 mg/dL   TSH     Status: Normal   Collection Time   02/05/12  7:43 PM      Component Value Range Comment   TSH 3.053  0.350 - 4.500 uIU/mL   T3, FREE     Status: Abnormal   Collection Time   02/05/12  7:43 PM      Component Value Range Comment   T3, Free 2.2 (*) 2.3 - 4.2 pg/mL   T4, FREE     Status: Abnormal   Collection Time   02/05/12  7:43 PM      Component Value Range Comment   Free T4 0.73 (*) 0.80 - 1.80 ng/dL    Labs are reviewed. Physical Findings: AIMS:   CIWA:    COWS:     Medication:  . amitriptyline  50 mg Oral QHS  . benztropine  2 mg Oral BH-qamhs  . divalproex  1,500 mg Oral Daily  . lamoTRIgine  150 mg Oral QHS  . levothyroxine  50 mcg Oral QAC breakfast  . modafinil  200 mg Oral  Q breakfast  . risperiDONE  2 mg Oral [id number]  . sertraline  150 mg Oral Daily    Treatment Plan Summary: 1. Continue admission for crisis management and stabilization. 2. Medication management to reduce current symptoms to base line and improve the patient's overall level of functioning 3. Treat health problems as indicated. 4. Develop treatment plan to decrease risk of relapse upon discharge and the need for readmission. 5. Psycho-social education regarding relapse prevention and self care. 6. Health care follow up as needed for medical problems. 7. Restart home medications where appropriate.   Plan: 1. Continue Amitriptyline 50mg  po at hs. 2. Cogentin 2mg  1 po at AM and HS. 3. Depakote ER 1,500mg  po daily at HS. 4. Lamotrigine 150mg  po qhs. 5. Levothyroxine po q AM AC breakfast. 6. Provigil 200mg  po q AM. 7. Risperidol 2mg  tablet at 8AM, 2pm,  and HS. 8  Sertaline 150mg  po q AM. 9. Initiate Haldol 1mg  po at HS for sleep. Rona Ravens. Norelle Runnion PAC 02/06/2012, 2:59 PM

## 2012-02-06 NOTE — Progress Notes (Signed)
Patient ID: Danielle [name], female   DOB: May 06, 1971, 41 y.o.   MRN: [id number] D: pt. Lying in bed startled when writer calls her name. Pt. Reports "I'm scared." "It's been a bad day." She reports depression and anxiety at "8" of 10.  "I see things coming out of the wall." Writer redirects pt. Focus and asks pt. About earlier visit with husband "we been together for 16 years" Pt. Reports they have 3 children 24, 22 and 65 y.o. And 3 grandchildren. Pt. Notes she was sad that husband had to leave. A:Writer provided positive reinforcement by  Diverting pt. concerns at one point to alleviate fears. Staff monitors q17min for safety. Pt. Encouraged to attend karaoke. R: Pt. Seems less fearful when talking about family. Pt. Is safe on the unit. Pt. Goes to [facility] but only stays about 10 min before returning.

## 2012-02-06 NOTE — Progress Notes (Signed)
[facility] Group Notes:  (Counselor/Nursing/MHT/Case Management/Adjunct)  02/06/2012 5:21 PM  Type of Therapy:Psycho-education 02/05/12 Participation Level:  Did Not Attend    Danielle [name] 02/06/2012, 5:21 PM

## 2012-02-06 NOTE — Progress Notes (Signed)
Psychoeducational Group Note  Date:  02/06/2012 Time:  0930  Group Topic/Focus:  Rediscovering Joy:   The focus of this group is to explore various ways to relieve stress in a positive manner.  Participation Level: Did Not Attend  Participation Quality:  Not Applicable  Affect:  Not Applicable  Cognitive:  Not Applicable  Insight:  Not Applicable  Engagement in Group: Not Applicable  Additional Comments:  Pt refused to attend group claiming that it is unsafe for her to be out of her room.  Danielle [name] E 02/06/2012, 11:30 AM

## 2012-02-07 MED ORDER — DIVALPROEX SODIUM ER 500 MG PO TB24
500.0000 mg | ORAL_TABLET | Freq: Every day | ORAL | Status: DC
  Administered 2012-02-08 – 2012-02-10 (×3): 500 mg via ORAL
  Filled 2012-02-07 (×4): qty 1

## 2012-02-07 MED ORDER — HALOPERIDOL 2 MG PO TABS
2.0000 mg | ORAL_TABLET | ORAL | Status: DC
  Administered 2012-02-08 – 2012-02-11 (×7): 2 mg via ORAL
  Filled 2012-02-07 (×9): qty 1

## 2012-02-07 NOTE — Progress Notes (Signed)
Psychoeducational Group Note  Date:  02/07/2012 Time:  2000  Group Topic/Focus:  Wrap-Up Group:   The focus of this group is to help patients review their daily goal of treatment and discuss progress on daily workbooks.  Participation Level: Did Not Attend  Participation Quality:  Not Applicable  Affect:  Not Applicable  Cognitive:  Not Applicable  Insight:  Not Applicable  Engagement in Group: Not Applicable  Additional Comments:    Gwenevere Ghazi Patience 02/07/2012, 10:10 PM

## 2012-02-07 NOTE — Progress Notes (Signed)
Patient ID: Danielle [name], female   DOB: 1971-01-26, 41 y.o.   MRN: [id number] D. The patient remained in her room most of the evening. Stated that she heard and saw things coming out of the walls. Very guarded and anxious. A. Encouraged to attend evening group and to tell staff when she was experiencing A/V hallucinations. Administered HS medications. R. Refused to attend group. Stated the A/V hallucinations were worst when she was around large groups of people. She did leave her room to get snack and medications. Compliant with medications.

## 2012-02-07 NOTE — Progress Notes (Signed)
Pt reports that she has voices that are screaming and tell her to hurt herself. She has visuals of things coming out of the wall and reports that when she puts her bed covers over her head that it protects her. Supported pt to discuss feeling. Gave medications as ordered and 15 minute checks. Reported voices to MD. She denies si and hi. Safety maintained on unit.

## 2012-02-07 NOTE — Discharge Planning (Signed)
Pt did not attend morning aftercare planning group but came to tx team. Pt still seeing monsters coming out of walls and hearing voices that tell her to hurt herself. Pt reported sleeping and eating well. She rates depression at 9, anxiety at 7, and hopelessness at 8-9. Pt has no s/i or h/i at this time. Pt stated that hiding under covers has helped reduce fear. Doctor and CM will attempt to contact husband around 3pm today.

## 2012-02-07 NOTE — Progress Notes (Signed)
[facility] MD Progress Note  02/07/2012 5:49 PM  Diagnosis:  Axis I: Schizophrenia - Paranoid Type.  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  Posttraumatic Stress Disorder.  Panic Disorder with Agoraphobia.   The patient was seen today and reports the following:   ADL's: Intact.  Sleep: The patient reports to sleeping well last night.  Appetite: The patient reports an improved appetite today.   Mild>(1-10) >Severe  Hopelessness (1-10): 8-9  Depression (1-10): 8-9 Anxiety (1-10): 8-9   Suicidal Ideation: The patient denies any suicidal ideations today. Plan: No  Intent: No  Means: No  Homicidal Ideation: The patient denies any homicidal ideations today.  Plan: No  Intent: No.  Means: No  General Appearance/Behavior:  Appropriate and cooperative today but appears significantly depressed and anxious.  Eye Contact: Good.  Speech: Appropriate in rate and volume today with no pressuring noted.  Motor Behavior: wnl.  Level of Consciousness: Alert and Oriented x 3.  Mental Status: Alert and Oriented x 3.  Mood: Severely depressed.  Affect: Severely constricted.  Anxiety Level: Severe anxiety reported.  Thought Process: Reports both auditory and visual hallucinations.  Thought Content: The patient reports ongoing auditory hallucinations instructing her to harm herself. She reports to seeing "monsters jumping out of the wall." She denies any delusional thinking today.  She states the intensity of these have not improved. Perception:. Auditory and visual hallucinations continue.  Judgment: Fair.  Insight: Fair.  Cognition: Oriented to person, place and time.  Sleep:  Number of Hours: 6.75    Vital Signs:Blood pressure 102/67, pulse 101, temperature 97.4 F (36.3 C), temperature source Oral, resp. rate 16, height 5' 8.5" (1.74 m), weight 112.038 kg (247 lb).  Current Medications: Current Facility-Administered Medications  Medication Dose Route Frequency Provider Last Rate Last  Dose  . acetaminophen (TYLENOL) tablet 650 mg  650 mg Oral Q6H PRN Ronny Bacon, MD   650 mg at 02/05/12 2010  . alum & mag hydroxide-simeth (MAALOX/MYLANTA) 200-200-20 MG/5ML suspension 30 mL  30 mL Oral Q4H PRN Rossie Muskrat, NP      . amitriptyline (ELAVIL) tablet 50 mg  50 mg Oral QHS Curlene Labrum Jjesus Dingley, MD   50 mg at 02/06/12 2158  . benztropine (COGENTIN) tablet 2 mg  2 mg Oral BH-qamhs Curlene Labrum Marimar Suber, MD   2 mg at 02/07/12 0821  . divalproex (DEPAKOTE ER) 24 hr tablet 1,500 mg  1,500 mg Oral Daily Curlene Labrum Kobie Whidby, MD   1,500 mg at 02/06/12 2159  . haloperidol (HALDOL) tablet 2 mg  2 mg Oral BH-qamhs Ashten Prats D Laynee Lockamy, MD      . lamoTRIgine (LAMICTAL) tablet 150 mg  150 mg Oral QHS Curlene Labrum Jarrod Bodkins, MD   150 mg at 02/06/12 2158  . levothyroxine (SYNTHROID, LEVOTHROID) tablet 50 mcg  50 mcg Oral QAC breakfast Curlene Labrum Gaynelle Pastrana, MD   50 mcg at 02/07/12 0656  . magnesium hydroxide (MILK OF MAGNESIA) suspension 30 mL  30 mL Oral Daily PRN Rossie Muskrat, NP      . modafinil (PROVIGIL) tablet 200 mg  200 mg Oral Q breakfast Curlene Labrum Michalina Calbert, MD   200 mg at 02/07/12 9604  . naproxen (NAPROSYN) tablet 250 mg  250 mg Oral BID PRN Curlene Labrum Mussa Groesbeck, MD      . risperiDONE (RISPERDAL) tablet 2 mg  2 mg Oral [id number] Curlene Labrum Elchanan Bob, MD   2 mg at 02/07/12 1402  . sertraline (ZOLOFT) tablet 150 mg  150 mg Oral  Daily Curlene Labrum Ankith Edmonston, MD   150 mg at 02/07/12 9562  . DISCONTD: haloperidol (HALDOL) tablet 1 mg  1 mg Oral QHS Verne Spurr, PA-C   1 mg at 02/06/12 2200   Lab Results:  Results for orders placed during the hospital encounter of 02/04/12 (from the past 48 hour(s))  VALPROIC ACID LEVEL     Status: Abnormal   Collection Time   02/05/12  7:43 PM      Component Value Range Comment   Valproic Acid Lvl 36.3 (*) 50.0 - 100.0 ug/mL   HEMOGLOBIN A1C     Status: Abnormal   Collection Time   02/05/12  7:43 PM      Component Value Range Comment   Hemoglobin A1C 5.9 (*) <5.7 %    Mean  Plasma Glucose 123 (*) <117 mg/dL   TSH     Status: Normal   Collection Time   02/05/12  7:43 PM      Component Value Range Comment   TSH 3.053  0.350 - 4.500 uIU/mL   T3, FREE     Status: Abnormal   Collection Time   02/05/12  7:43 PM      Component Value Range Comment   T3, Free 2.2 (*) 2.3 - 4.2 pg/mL   T4, FREE     Status: Abnormal   Collection Time   02/05/12  7:43 PM      Component Value Range Comment   Free T4 0.73 (*) 0.80 - 1.80 ng/dL    Physical Findings: AIMS: Facial and Oral Movements Muscles of Facial Expression: None, normal Lips and Perioral Area: None, normal Jaw: None, normal Tongue: None, normal,Extremity Movements Upper (arms, wrists, hands, fingers): None, normal Lower (legs, knees, ankles, toes): None, normal, Trunk Movements Neck, shoulders, hips: None, normal, Overall Severity Severity of abnormal movements (highest score from questions above): None, normal Incapacitation due to abnormal movements: None, normal Patient's awareness of abnormal movements (rate only patient's report): No Awareness, Dental Status Current problems with teeth and/or dentures?: No Does patient usually wear dentures?: No   Review of Systems:  Neurological: The patient denies any headaches today. She denies any seizures or dizziness.  G.I.: The patient denies any constipation or stomach upset today.  Musculoskeletal: The patient denies any musculoskeletal issues today.   The patient was seen today and reports to sleeping much better with an improved appetite.  She also reports ongoing severe feelings of sadness, anhedonia and depressed mood. She denies any suicidal or homicidal ideations today.  She continues to report both auditory and visual hallucination which she states is unchanged since admission. The patient also reports severe anxiety today and denies any medication related side effects.  It was discussed with the patient the possibility of increasing the medication Haldol  and she agreed with this plan.  Treatment Plan Summary:  1. Daily contact with patient to assess and evaluate symptoms and progress in treatment  2. Medication management  3. The patient will deny suicidal ideations or homicidal ideations for 48 hours prior to discharge and have a depression and anxiety rating of 3 or less. The patient will also deny any auditory or visual hallucinations or delusional thinking.   Plan:  1. Will continue the medication Elavil at 50 mgs po qhs for sleep and depression.  2. Will continue the medication Cogentin to 2 mg po qam and hs for EPS.  3. Will continue the medication Depakote ER at 1500 mgs po qhs for mood stabilization.  4.  Will continue the medication Lamictal at 150 mgs po qhs for mood stabilization.  5. Will continue the medication Synthroid at 50 mcg po qam for hypothyroidism.  6. Will continue the medication Modafinil at 200 mgs po qam for excessive daytime sedation related to OSA.  7. Will continue the medication Risperdal at 2 mgs po q am, 2 pm and hs for psychosis, anxiety and mood stabilization.  8. Will continue the medication Zoloft 150 mgs po q am for depression and anxiety.  9. Will increase the medication Haldol to 2 mgs po q am and hs to further address the patient's psychotic symptoms. 10. Laboratory studies reviewed.  11. Will continue to monitor.   Demeka Sutter 02/07/2012, 5:49 PM

## 2012-02-07 NOTE — Progress Notes (Signed)
Psychoeducational Group Note  Date:  02/07/2012 Time:  1100  Group Topic/Focus:  Relapse Prevention Planning:   The focus of this group is to define relapse and discuss the need for planning to combat relapse.  Participation Level:  Did Not Attend  Participation Quality:    Affect:    Cognitive:    Insight:    Engagement in Group:    Additional Comments:  Pt was in the bed sleeping.  Isla Pence M 02/07/2012, 1:27 PM

## 2012-02-08 DIAGNOSIS — F200 Paranoid schizophrenia: Principal | ICD-10-CM

## 2012-02-08 MED ORDER — OLANZAPINE 10 MG PO TBDP
10.0000 mg | ORAL_TABLET | Freq: Once | ORAL | Status: AC
  Administered 2012-02-08: 10 mg via ORAL
  Filled 2012-02-08 (×2): qty 1

## 2012-02-08 NOTE — Progress Notes (Signed)
Patient ID: SATONYA LUX, female   DOB: January 27, 1971, 41 y.o.   MRN: [id number]   [facility] Group Notes:  (Counselor/Nursing/MHT/Case Management/Adjunct)  02/08/2012 11 AM  Type of Therapy:  Aftercare Planning, Group Therapy, Dance/Movement Therapy   Participation Level:  Did Not Attend   [name] 02/08/2012. 11:26 AM

## 2012-02-08 NOTE — Progress Notes (Signed)
[facility] MD Progress Note  02/08/2012 2:32 PM  Diagnosis:   Axis I: Chronic Paranoid Schizophrenia Axis II: Deferred Axis III:  Past Medical History  Diagnosis Date  . Depressed   . Seizures   . Heart murmur   . Hypothyroidism   . Anemia   . Headache   . Anxiety   . PTSD (post-traumatic stress disorder)   . Shortness of breath    Subjective: Danielle [name] reports that she is doing "not so good today." She endorses visual hallucinations of things coming out of the wall, and auditory hallucinations of her mother's voice telling her she is no good and she should kill herself. She endorses good sleep last night and a good appetite. She denies any homicidal ideation, but she does endorse some suicidal thoughts, although she denies any plan or that she would hurt herself.  ADL's:  Impaired  Sleep: Good  Appetite:  Good  Suicidal Ideation:  Patient endorses thoughts, but no plan or intent. Homicidal Ideation:  Patient denies any thought, plan, or intent  AEB (as evidenced by):  Mental Status Examination/Evaluation: Objective:  Appearance: Guarded  Eye Contact::  Minimal  Speech:  Clear and Coherent and Childlike  Volume:  Normal  Mood:  Anxious and Depressed  Affect:  Constricted  Thought Process:  Circumstantial  Orientation:  Full  Thought Content:  Delusions, Hallucinations: Auditory Visual and Paranoid Ideation  Suicidal Thoughts:  Yes.  without intent/plan  Homicidal Thoughts:  No  Memory:  Immediate;   Good Recent;   Good Remote;   Good  Judgement:  Impaired  Insight:  Shallow  Psychomotor Activity:  Tremor  Concentration:  Good  Recall:  Good  Akathisia:  No  Handed:    AIMS (if indicated):     Assets:  Desire for Improvement  Sleep:  Number of Hours: 6.25    Vital Signs:Blood pressure 83/54, pulse 105, temperature 97.4 F (36.3 C), temperature source Oral, resp. rate 20, height 5' 8.5" (1.74 m), weight 112.038 kg (247 lb). Current Medications: Current  Facility-Administered Medications  Medication Dose Route Frequency Provider Last Rate Last Dose  . acetaminophen (TYLENOL) tablet 650 mg  650 mg Oral Q6H PRN Ronny Bacon, MD   650 mg at 02/05/12 2010  . alum & mag hydroxide-simeth (MAALOX/MYLANTA) 200-200-20 MG/5ML suspension 30 mL  30 mL Oral Q4H PRN Rossie Muskrat, NP      . amitriptyline (ELAVIL) tablet 50 mg  50 mg Oral QHS Curlene Labrum Readling, MD   50 mg at 02/07/12 2141  . benztropine (COGENTIN) tablet 2 mg  2 mg Oral BH-qamhs Curlene Labrum Readling, MD   2 mg at 02/08/12 0850  . divalproex (DEPAKOTE ER) 24 hr tablet 1,500 mg  1,500 mg Oral Daily Curlene Labrum Readling, MD   1,500 mg at 02/07/12 2141  . divalproex (DEPAKOTE ER) 24 hr tablet 500 mg  500 mg Oral Daily Curlene Labrum Readling, MD   500 mg at 02/08/12 0849  . haloperidol (HALDOL) tablet 2 mg  2 mg Oral BH-qamhs Curlene Labrum Readling, MD   2 mg at 02/08/12 0850  . lamoTRIgine (LAMICTAL) tablet 150 mg  150 mg Oral QHS Curlene Labrum Readling, MD   150 mg at 02/07/12 2141  . levothyroxine (SYNTHROID, LEVOTHROID) tablet 50 mcg  50 mcg Oral QAC breakfast Curlene Labrum Readling, MD   50 mcg at 02/08/12 0641  . magnesium hydroxide (MILK OF MAGNESIA) suspension 30 mL  30 mL Oral Daily PRN Rossie Muskrat, NP      .  modafinil (PROVIGIL) tablet 200 mg  200 mg Oral Q breakfast Curlene Labrum Readling, MD   200 mg at 02/08/12 0850  . naproxen (NAPROSYN) tablet 250 mg  250 mg Oral BID PRN Curlene Labrum Readling, MD      . risperiDONE (RISPERDAL) tablet 2 mg  2 mg Oral [id number] Curlene Labrum Readling, MD   2 mg at 02/08/12 1421  . sertraline (ZOLOFT) tablet 150 mg  150 mg Oral Daily Curlene Labrum Readling, MD   150 mg at 02/08/12 0850  . DISCONTD: haloperidol (HALDOL) tablet 1 mg  1 mg Oral QHS Verne Spurr, PA-C   1 mg at 02/06/12 2200    Lab Results: No results found for this or any previous visit (from the past 48 hour(s)).  Physical Findings: AIMS: Facial and Oral Movements Muscles of Facial Expression: None, normal Lips and Perioral  Area: None, normal Jaw: None, normal Tongue: None, normal,Extremity Movements Upper (arms, wrists, hands, fingers): None, normal Lower (legs, knees, ankles, toes): None, normal, Trunk Movements Neck, shoulders, hips: None, normal, Overall Severity Severity of abnormal movements (highest score from questions above): None, normal Incapacitation due to abnormal movements: None, normal Patient's awareness of abnormal movements (rate only patient's report): No Awareness, Dental Status Current problems with teeth and/or dentures?: No Does patient usually wear dentures?: No  CIWA:    COWS:     Treatment Plan Summary: Daily contact with patient to assess and evaluate symptoms and progress in treatment Medication management  Plan: We will give her a one-time dose of Zyprexa Zydis 10 mg now, and monitor for response. Upon further evaluation, her antipsychotic medications will be adjusted. Zadia Uhde 02/08/2012, 2:32 PM

## 2012-02-08 NOTE — Progress Notes (Signed)
D   Pt reports seeing things pop out of the walls   She is depressed and anxious   She reports being fearful and isolates to her room   She is compliant with medications  She did not attend group and rarely interacts with others A   Verbal support given  Medications administered and effectiveness monitored   Q 15 min checks R   Pt safe at present time

## 2012-02-09 MED ORDER — OLANZAPINE 10 MG PO TABS
10.0000 mg | ORAL_TABLET | Freq: Every day | ORAL | Status: DC
  Administered 2012-02-09 – 2012-02-10 (×2): 10 mg via ORAL
  Filled 2012-02-09 (×4): qty 1

## 2012-02-09 MED ORDER — OLANZAPINE 10 MG PO TBDP
10.0000 mg | ORAL_TABLET | Freq: Once | ORAL | Status: AC
  Administered 2012-02-09: 10 mg via ORAL
  Filled 2012-02-09 (×2): qty 1

## 2012-02-09 NOTE — Progress Notes (Signed)
Patient ID: Danielle [name], female   DOB: 1970-07-16, 41 y.o.   MRN: [id number] D. The patient is a little less anxious. Stated that she had two visits from her husband today that has helped her to cope. She didn't think the new medication, Zyprexa had helped relieve her anxiety or hallucinations, but she was out of her room more and able to interact in the milieu. A. Met with the patient 1:1 and discussed her medications an encouraged to attend evening wrap up group. Administered medications. R. The patient attended and participated in evening group. The patient knows her medications and dosages. Compliant with medications.

## 2012-02-09 NOTE — Progress Notes (Signed)
Patient ID: Danielle [name], female   DOB: October 12, 1970, 41 y.o.   MRN: [id number] [facility] Group Notes:  (Counselor/Nursing/MHT/Case Management/Adjunct)  02/09/2012 11 AM  Type of Therapy:  Aftercare Planning, Group Therapy, Dance/Movement Therapy   Participation Level:  Did Not Attend   Modes of Intervention:  Clarification, Problem-solving, Role-play, Socialization and Support  Summary of Progress/Problems: Pt. Did not attend aftercare planning group and counseling group on healthy support systems.   Debarah Crape 02/09/2012. 11:18 AM
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
## 2012-02-09 NOTE — Progress Notes (Signed)
Psychoeducational Group Note  Date:  02/09/2012 Time:  0945 am  Group Topic/Focus:  Making Healthy Choices:   The focus of this group is to help patients identify negative/unhealthy choices they were using prior to admission and identify positive/healthier coping strategies to replace them upon discharge.  Participation Level:  Minimal  Participation Quality:  Resistant  Affect:  Anxious  Cognitive:  Hallucinating  Insight:  Limited  Engagement in Group:  Limited  Additional Comments:    Andrena Mews 02/09/2012, 10:29 AM

## 2012-02-09 NOTE — Progress Notes (Signed)
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 Depressed   . Seizures   . Heart murmur   . Hypothyroidism   . Anemia   . Headache   . Anxiety   . PTSD (post-traumatic stress disorder)   . Shortness of breath    Subjective: Mauria is lying in bed today and continues to endorse auditory and visual hallucinations as well as suicidal thoughts without plan or intent. She denies any homicidal ideations. She reports that she tried to sit in group this morning, but was unable to stay do to her anxiety. As reported by her nurse, after receiving a Zyprexa Zydis 10 mg yesterday, [name] was able to sit in group. [name] reports that her appetite is poor  ADL's:  Impaired  Sleep: Fair  Appetite:  Poor  Suicidal Ideation: Thoughts only Plan:  None Intent:  None Means:  None Homicidal Ideation:  Patient denies any thought, plan, or intent  AEB (as evidenced by):  Mental Status Examination/Evaluation: Objective:  Appearance: Bizarre and Disheveled  Eye Contact::  Fair  Speech:  Clear and Coherent  Volume:  Decreased  Mood:  Anxious  Affect:  Congruent  Thought Process:  Logical  Orientation:  Full  Thought Content:  Hallucinations: Auditory Visual  Suicidal Thoughts:  Yes.  without intent/plan  Homicidal Thoughts:  No  Memory:  Immediate;   Good Recent;   Good Remote;   Good  Judgement:  Fair  Insight:  Lacking  Psychomotor Activity:  Decreased  Concentration:  Good  Recall:  Good  Akathisia:  No  Handed:    AIMS (if indicated):     Assets:  Desire for Improvement  Sleep:  Number of Hours: 6.75    Vital Signs:Blood pressure 115/83, pulse 87, temperature 96.4 F (35.8 C), temperature source Oral, resp. rate 14, height 5' 8.5" (1.74 m), weight 112.038 kg (247 lb). Current Medications: Current Facility-Administered Medications  Medication  Dose Route Frequency Provider Last Rate Last Dose  . acetaminophen (TYLENOL) tablet 650 mg  650 mg Oral Q6H PRN Ronny Bacon, MD   650 mg at 02/05/12 2010  . alum & mag hydroxide-simeth (MAALOX/MYLANTA) 200-200-20 MG/5ML suspension 30 mL  30 mL Oral Q4H PRN Rossie Muskrat, NP      . amitriptyline (ELAVIL) tablet 50 mg  50 mg Oral QHS Curlene Labrum Readling, MD   50 mg at 02/08/12 2122  . benztropine (COGENTIN) tablet 2 mg  2 mg Oral BH-qamhs Curlene Labrum Readling, MD   2 mg at 02/09/12 0801  . divalproex (DEPAKOTE ER) 24 hr tablet 1,500 mg  1,500 mg Oral Daily Curlene Labrum Readling, MD   1,500 mg at 02/08/12 2121  . divalproex (DEPAKOTE ER) 24 hr tablet 500 mg  500 mg Oral Daily Curlene Labrum Readling, MD   500 mg at 02/09/12 0801  . haloperidol (HALDOL) tablet 2 mg  2 mg Oral BH-qamhs Curlene Labrum Readling, MD   2 mg at 02/09/12 0801  . lamoTRIgine (LAMICTAL) tablet 150 mg  150 mg Oral QHS Curlene Labrum Readling, MD   150 mg at 02/08/12 2122  . levothyroxine (SYNTHROID, LEVOTHROID) tablet 50 mcg  50 mcg Oral QAC breakfast Curlene Labrum Readling, MD   50 mcg at 02/09/12 0700  . magnesium hydroxide (MILK OF MAGNESIA) suspension 30 mL  30 mL Oral Daily PRN Rossie Muskrat,  NP      . modafinil (PROVIGIL) tablet 200 mg  200 mg Oral Q breakfast Curlene Labrum Readling, MD   200 mg at 02/09/12 0801  . naproxen (NAPROSYN) tablet 250 mg  250 mg Oral BID PRN Ronny Bacon, MD      . OLANZapine (ZYPREXA) tablet 10 mg  10 mg Oral QHS Jorje Guild, PA-C      . OLANZapine zydis (ZYPREXA) disintegrating tablet 10 mg  10 mg Oral Once Jorje Guild, PA-C   10 mg at 02/08/12 1445  . OLANZapine zydis (ZYPREXA) disintegrating tablet 10 mg  10 mg Oral Once Jorje Guild, PA-C      . sertraline (ZOLOFT) tablet 150 mg  150 mg Oral Daily Curlene Labrum Readling, MD   150 mg at 02/09/12 0801  . DISCONTD: risperiDONE (RISPERDAL) tablet 2 mg  2 mg Oral [id number] Curlene Labrum Readling, MD   2 mg at 02/09/12 0801    Lab Results: No results found for this or any previous visit  (from the past 48 hour(s)).  Physical Findings: AIMS: Facial and Oral Movements Muscles of Facial Expression: None, normal Lips and Perioral Area: None, normal Jaw: None, normal Tongue: None, normal,Extremity Movements Upper (arms, wrists, hands, fingers): None, normal Lower (legs, knees, ankles, toes): None, normal, Trunk Movements Neck, shoulders, hips: None, normal, Overall Severity Severity of abnormal movements (highest score from questions above): None, normal Incapacitation due to abnormal movements: None, normal Patient's awareness of abnormal movements (rate only patient's report): No Awareness, Dental Status Current problems with teeth and/or dentures?: No Does patient usually wear dentures?: No  CIWA:    COWS:     Treatment Plan Summary: Daily contact with patient to assess and evaluate symptoms and progress in treatment Medication management  Plan: We will discontinue her Risperdal, and start her on Zyprexa 10 mg at bedtime. We will give her another Zyprexa Zydis 10 mg now.  Dairon Procter 02/09/2012, 12:07 PM

## 2012-02-09 NOTE — Progress Notes (Signed)
D   Pt has had increased anxiety and fearfullness today    She attempted to sit through group but had to get up and leave   She endorses visual hallucinations and startles easily   A   Verbal support given  Medications administered and effectiveness monitored  Q 15 min checks R  Pt safe at present time

## 2012-02-10 LAB — HEPATIC FUNCTION PANEL
ALT: 53 U/L — ABNORMAL HIGH (ref 0–35)
AST: 43 U/L — ABNORMAL HIGH (ref 0–37)
Bilirubin, Direct: <0.1 mg/dL (ref 0.0–0.3)

## 2012-02-10 MED ORDER — IBUPROFEN 200 MG PO TABS: 200.0000 mg | ORAL_TABLET | Freq: Four times a day (QID) | ORAL | Status: DC | PRN

## 2012-02-10 MED ORDER — DIVALPROEX SODIUM ER 500 MG PO TB24
1000.0000 mg | ORAL_TABLET | ORAL | Status: DC
  Administered 2012-02-10 – 2012-02-17 (×14): 1000 mg via ORAL
  Filled 2012-02-10 (×19): qty 2

## 2012-02-10 NOTE — Progress Notes (Signed)
Patient ID: Danielle [name], female   DOB: 1970-05-21, 41 y.o.   MRN: [id number] [facility] MD Progress Note  02/10/2012 10:39 AM Subjective: The patient was seen today and reports the following:   Patient is a 41 y/o woman who was admitted for psychosis. The patient reports continued auditory and visual hallucinations, with command type hallucinations of her mother telling her to hurt herself. She reports that she is able to cope with the hallucinations by putting her covers over her head. She agrees to talk with staff if she cannot keep herself from hurting herself.  The patient reports that she has been having some headaches and used depakote in the past for headaches. She reports her sleep and appetite are good.    ADL's: Intact.  Sleep: The patient reports to sleeping well last night.  Appetite: The patient reports an improved appetite today.   Mild>(1-10) >Severe  Hopelessness (1-10): 8-9  Depression (1-10): 8-9 Anxiety (1-10): 8-9   Suicidal Ideation: The patient denies any suicidal ideations today. Plan: No  Intent: No  Means: No  Homicidal Ideation: The patient denies any homicidal ideations today.  Plan: No  Intent: No.  Means: No  General Appearance/Behavior:  Appropriate and cooperative today but continues to appear significantly depressed and anxious.  Eye Contact: Good.  Speech: Appropriate in rate and volume today with no pressuring noted.  Motor Behavior: wnl.  Level of Consciousness: Alert and Oriented x 3.  Mental Status: Alert and Oriented x 3.  Mood: Severely depressed.  Affect: Severely constricted.  Anxiety Level: Severe anxiety reported.  Thought Process: Reports both auditory and visual hallucinations.  Thought Content: The patient reports ongoing auditory hallucinations instructing her to harm herself. She reports to seeing " her mother's face on the wall telling me I'm useless and I should hurt myself." She denies any delusional thinking today.  She states the  intensity of these have not improved. Perception:. Auditory and visual hallucinations continue.  Judgment: Fair.  Insight: Fair.  Cognition: Oriented to person, place and time.  Sleep:  Number of Hours: 6    Vital Signs:Blood pressure 104/72, pulse 96, temperature 97.9 F (36.6 C), temperature source Oral, resp. rate 20, height 5' 8.5" (1.74 m), weight 112.038 kg (247 lb).  Current Medications: Current Facility-Administered Medications  Medication Dose Route Frequency Provider Last Rate Last Dose  . acetaminophen (TYLENOL) tablet 650 mg  650 mg Oral Q6H PRN Ronny Bacon, MD   650 mg at 02/05/12 2010  . alum & mag hydroxide-simeth (MAALOX/MYLANTA) 200-200-20 MG/5ML suspension 30 mL  30 mL Oral Q4H PRN Rossie Muskrat, NP      . amitriptyline (ELAVIL) tablet 50 mg  50 mg Oral QHS Curlene Labrum Readling, MD   50 mg at 02/09/12 2155  . benztropine (COGENTIN) tablet 2 mg  2 mg Oral BH-qamhs Curlene Labrum Readling, MD   2 mg at 02/10/12 3086  . divalproex (DEPAKOTE ER) 24 hr tablet 1,500 mg  1,500 mg Oral Daily Curlene Labrum Readling, MD   1,500 mg at 02/09/12 2155  . divalproex (DEPAKOTE ER) 24 hr tablet 500 mg  500 mg Oral Daily Curlene Labrum Readling, MD   500 mg at 02/10/12 0838  . haloperidol (HALDOL) tablet 2 mg  2 mg Oral BH-qamhs Curlene Labrum Readling, MD   2 mg at 02/10/12 5784  . lamoTRIgine (LAMICTAL) tablet 150 mg  150 mg Oral QHS Curlene Labrum Readling, MD   150 mg at 02/09/12 2155  . levothyroxine (SYNTHROID,  LEVOTHROID) tablet 50 mcg  50 mcg Oral QAC breakfast Ronny Bacon, MD   50 mcg at 02/10/12 0643  . magnesium hydroxide (MILK OF MAGNESIA) suspension 30 mL  30 mL Oral Daily PRN Rossie Muskrat, NP      . modafinil (PROVIGIL) tablet 200 mg  200 mg Oral Q breakfast Curlene Labrum Readling, MD   200 mg at 02/10/12 0838  . naproxen (NAPROSYN) tablet 250 mg  250 mg Oral BID PRN Ronny Bacon, MD      . OLANZapine (ZYPREXA) tablet 10 mg  10 mg Oral QHS Jorje Guild, PA-C   10 mg at 02/09/12 2155  . OLANZapine zydis  (ZYPREXA) disintegrating tablet 10 mg  10 mg Oral Once Jorje Guild, PA-C   10 mg at 02/09/12 1453  . sertraline (ZOLOFT) tablet 150 mg  150 mg Oral Daily Curlene Labrum Readling, MD   150 mg at 02/10/12 0837  . DISCONTD: risperiDONE (RISPERDAL) tablet 2 mg  2 mg Oral [id number] Curlene Labrum Readling, MD   2 mg at 02/09/12 0801   Lab Results:  No results found for this or any previous visit (from the past 48 hour(s)).  Physical Findings:  AIMS: Facial and Oral Movements Muscles of Facial Expression: None, normal Lips and Perioral Area: None, normal Jaw: None, normal Tongue: None, normal,Extremity Movements Upper (arms, wrists, hands, fingers): None, normal Lower (legs, knees, ankles, toes): None, normal, Trunk Movements Neck, shoulders, hips: None, normal, Overall Severity Severity of abnormal movements (highest score from questions above): None, normal Incapacitation due to abnormal movements: None, normal Patient's awareness of abnormal movements (rate only patient's report): No Awareness, Dental Status Current problems with teeth and/or dentures?: No Does patient usually wear dentures?: No   Review of Systems:  Neurological: The patient  Reports headaches today. She denies any seizures or dizziness.  G.I.: The patient denies any constipation or stomach upset today.  Musculoskeletal: The patient denies any musculoskeletal issues today.   Diagnosis:  Axis I: Schizophrenia - Paranoid Type.  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  Posttraumatic Stress Disorder.  Panic Disorder with Agoraphobia.     Treatment Plan Summary:  1. Daily contact with patient to assess and evaluate symptoms and progress in treatment  2. Medication management  3. The patient will deny suicidal ideations or homicidal ideations for 48 hours prior to discharge and have a depression and anxiety rating of 3 or less. The patient will also deny any auditory or visual hallucinations or delusional thinking.   Plan:    1. Will continue the medication Elavil at 50 mgs po qhs for sleep and depression.  2. Will continue the medication Cogentin to 2 mg po qam and hs for EPS.  3. Will change the medication Depakote ER at 1000 mgs po QAM and QHS for mood stabilization.  4. Will continue the medication Lamictal at 150 mgs po qhs for mood stabilization.  5. Will continue the medication Synthroid at 50 mcg po qam for hypothyroidism.  6. Will continue the medication Modafinil at 200 mgs po qam for excessive daytime sedation related to OSA.  7. Will continue the medication Risperdal at 2 mgs po q am, 2 pm and hs for psychosis, anxiety and mood stabilization.  8. Will continue the medication Zoloft 150 mgs po q am for depression and anxiety.  9. Will increase the medication Haldol to 2 mgs po q am and hs to further address the patient's psychotic symptoms. 10. Laboratory studies reviewed.  Will  reorder Valproic Acid level, LFT, and Ammonia level. 11. Will continue to monitor.   Danielle [name] 02/10/2012, 10:39 AM

## 2012-02-10 NOTE — Progress Notes (Signed)
Patient ID: Danielle [name], female   DOB: 1970-07-04, 41 y.o.   MRN: [id number] Patient stated that she fell in the bathroom unwitnessed.  She told the MHT that she slipped in the water after getting out of the shower.  She told the MHT that she did not hit her head.  She used the call button to call staff, however, when staff arrived, she was sitting on the toilet drying herself off.  When the nurse came in, she stated that she did, in fact, hit her head and winced every time the nurse touch her head to see if there were any open areas.  Her vitals were taken and they were within normal limits.  She stated she had a history of falls and said she fell yesterday too.  Patient was up, ambulating and went downstairs for recreation time.  She still remains with passive SI, positive for command voices telling her to harm herself.  She denies HI.  Continue to monitor for falls and medication management.  Safety checks completed every 15 minutes.

## 2012-02-10 NOTE — Progress Notes (Signed)
Psychoeducational Group Note  Date:  02/10/2012 Time:  2000  Group Topic/Focus:  Wrap-Up Group:   The focus of this group is to help patients review their daily goal of treatment and discuss progress on daily workbooks.  Participation Level:  Active  Participation Quality:  Appropriate  Affect:  Appropriate  Cognitive:  Appropriate  Insight:  Good  Engagement in Group:  Good  Additional Comments:  Patient attended and participated in group tonight. She reports that she was scared today. She did not do anything today she just stayed in her room in bed with the covers over her head because she was hearing and seeing things coming out of her wall. The patient reports that her husband was her only support system.  Lita Mains [facility] 02/10/2012, 12:22 AM

## 2012-02-10 NOTE — Progress Notes (Signed)
Patient ID: Danielle [name], female   DOB: [birth year], 41 y.o.   MRN: [id number] The patient attempted to stay in her room in bed all evening. Encouraged to attend evening wrap up group. Very negative towards any suggestions made by group of possible coping strategies to try. Became angry and left group. Compliant with medication. Requested help putting her c-pap mask and retired for the evening.

## 2012-02-10 NOTE — Treatment Plan (Signed)
Interdisciplinary Treatment Plan Update (Adult)  Date: 02/10/2012  Time Reviewed: 5:00 PM   Progress in Treatment: Attending groups: No Participating in groups: No Taking medication as prescribed: Yes Tolerating medication: Yes   Family/Significant other contact made:  Yes Patient understands diagnosis:  Yes  As evidenced by asking for help with psychosis Discussing patient identified problems/goals with staff:  Yes  See below Medical problems stabilized or resolved:  Yes Denies suicidal/homicidal ideation: Yes  In tx team Issues/concerns per patient self-inventory:  Not filled out Other:  New problem(s) identified: N/A  Reason for Continuation of Hospitalization: Hallucinations Medication stabilization  Interventions implemented related to continuation of hospitalization:  Medication adjustment,  Encourage group attendance and participation  Additional comments: Pt states she currently keeps self safe from negative voices by pulling the covers over her head.  Estimated length of stay: 3-4 days  Discharge Plan: return home, follow up outpt  New goal(s): N/A  Review of initial/current patient goals per problem list:   1.  Goal(s):   Met:  Yes  Target date:  As evidenced by:  2.  Goal (s): Eliminate psychosis  Met:  No  Target date:10/10  As evidenced by: self report, observation  3.  Goal(s): Stabilize mood  Met:  No  Target date:10/10  As evidenced by: Nur will rate her depression and anxiety at a 4 or less  4.  Goal(s): Identify comprehensive mental wellness plan  Met:  No  Target date:10/10  As evidenced [id number] report  Attendees: Patient: Danielle [name]  02/10/2012 5:00 PM  Family:     Physician:  Nelly Rout 02/10/2012 5:00 PM   Nursing:    02/10/2012 5:00 PM   Case Manager:  Richelle Ito,  02/10/2012 5:00 PM   Counselor:  Veto Kemps 02/10/2012 5:00 PM   Other:     Other:     Other:     Other:      Scribe for Treatment Team:   Ida Rogue, 02/10/2012 5:00 PM

## 2012-02-10 NOTE — Progress Notes (Signed)
[facility] Group Notes:  (Counselor/Nursing/MHT/Case Management/Adjunct)  02/10/2012 2:31 PM  Type of Therapy:  Group Therapy  Participation Level:  Minimal  Participation Quality:  Sharing  Affect:  Anxious  Cognitive:  Hallucinating  Insight:  Limited  Engagement in Group:  Limited  Engagement in Therapy:  Limited  Modes of Intervention:  Education, Orientation and Support  Summary of Progress/Problems: Patient sat with a blanket over her head. She stated that she was having trouble keeping up with her medications and that her husband is having to help her. She also talked about not being able to see her new grandson by herself due to her seizures and her mental health. She had to leave the group because the voices were so bad.   Torry Istre, Aram Beecham 02/10/2012, 2:31 PM

## 2012-02-10 NOTE — Progress Notes (Signed)
Psychoeducational Group Note  Date:  02/10/2012 Time:  1100  Group Topic/Focus:  Self Care:   The focus of this group is to help patients understand the importance of self-care in order to improve or restore emotional, physical, spiritual, interpersonal, and financial health.  Participation Level:  Did Not Attend  Participation Quality:    Affect:    Cognitive:    Insight:    Engagement in Group:    Additional Comments:  none  Marquis Lunch, Brooks Kinnan 02/10/2012, 4:27 PM

## 2012-02-10 NOTE — Progress Notes (Signed)
Psychoeducational Group Note  Date:  02/10/2012 Time:  1000  Group Topic/Focus:  Wellness Toolbox:   The focus of this group is to discuss various aspects of wellness, balancing those aspects and exploring ways to increase the ability to experience wellness.  Patients will create a wellness toolbox for use upon discharge.  Participation Level:  Minimal  Participation Quality:  Resistant  Affect:  Flat  Cognitive:  Appropriate  Insight:  Good  Engagement in Group:  Good  Additional Comments:  none  Jennie Bolar, Genia Plants 02/10/2012, 1:26 PM

## 2012-02-10 NOTE — Progress Notes (Signed)
D: Was resting quietly in bed with eyes closed on approach. Opened eyes spontaneously to name. Appears blunted and depressed. Somewhat anxious but cooperative with assessment. States she is still hearing voices tell her to hurt herself and she feels scared here. States she just wants to stay in bed r/t her fear. Continues to endorse passive SI but contracts for safety. Denies HI. Denies pain or discomfort. Offers no additional questions or concerns.  A: Support and encouragement provided. Safety has been maintained with Q15 minute observation. POC and medications for the shift reviewed and understanding verbalized. Meds given as ordered by MD. Reminded pt she is in a safe place and her hallucinations are part of her illness and are not real. Encouraged her to attend future groups and if she feels like she cant go r/t hallucinations, she needed to discuss it with her RN.  R: Pt remains safe. She is anxious and seclusive.  She is non-complaint with programming but is compliant with medications. Will continue Q15 minute observation and continue current POC

## 2012-02-11 MED ORDER — MODAFINIL 200 MG PO TABS
100.0000 mg | ORAL_TABLET | Freq: Every day | ORAL | Status: DC
  Administered 2012-02-12 – 2012-02-13 (×2): via ORAL
  Filled 2012-02-11 (×2): qty 1

## 2012-02-11 MED ORDER — SERTRALINE HCL 100 MG PO TABS
200.0000 mg | ORAL_TABLET | Freq: Every day | ORAL | Status: DC
  Administered 2012-02-12 – 2012-02-17 (×6): 200 mg via ORAL
  Filled 2012-02-11 (×8): qty 2

## 2012-02-11 MED ORDER — HALOPERIDOL 2 MG PO TABS
2.0000 mg | ORAL_TABLET | ORAL | Status: DC
  Administered 2012-02-11 – 2012-02-14 (×10): 2 mg via ORAL
  Filled 2012-02-11 (×15): qty 1

## 2012-02-11 MED ORDER — LEVOTHYROXINE SODIUM 75 MCG PO TABS
75.0000 ug | ORAL_TABLET | Freq: Every day | ORAL | Status: DC
  Administered 2012-02-12 – 2012-02-17 (×6): 75 ug via ORAL
  Filled 2012-02-11 (×9): qty 1

## 2012-02-11 NOTE — Progress Notes (Signed)
D: Patient appears, anxious, sad and depressed. Calm and cooperative with assessment. Pt is isolated in room crying stating "today is 5 year anniversary of father's death". Pt state she  slept well, appetite is improving and energy level is low. Pt. Rates depression at "9" out of 10 scale and hopelessness as "9" out of 10 scale. Passive SI/HI/, pt states "decreased AH".  Offered no additional question or concerns.   A: Pt asked to contract for safety due to passive SI. Safety has been maintained with Q15 minutes observation.  Medications administered  as ordered. Pt offered flu vaccine. Pt offered emotional support. Encouraged group attendance.   R:Pt verbally contracted for safety. Patient remains safe. She is complaint with medications. Pt refused flu vaccine. Patient refused to attend groups. Safety has been maintained Q15 and continue current POC.

## 2012-02-11 NOTE — Progress Notes (Addendum)
Patient ID: Danielle [name], female   DOB: 09/23/1970, 41 y.o.   MRN: [id number] [facility] MD Progress Note  02/11/2012 1:21 PM Subjective: The patient was seen today and reports the following:   Patient is a 41 y/o woman who was admitted for psychosis. The patient reports continued auditory and visual hallucinations, with command type hallucinations of her mother telling her to hurt herself. She reports that she is able to cope with the hallucinations by putting her covers over her head. She agrees to talk with staff if she cannot keep herself from hurting herself.  The patient reports that she has been having some headaches and used depakote in the past for headaches. She reports her sleep and appetite are good.    ADL's: Intact.  Sleep: The patient reports to sleeping well last night.  Appetite: The patient reports an improved appetite today.   Mild>(1-10) >Severe  Hopelessness (1-10): 8-9  Depression (1-10): 8-9 Anxiety (1-10): 8-9   Suicidal Ideation: The patient denies any suicidal ideations today. Plan: No  Intent: No  Means: No  Homicidal Ideation: The patient denies any homicidal ideations today.  Plan: No  Intent: No.  Means: No  General Appearance/Behavior:  Appropriate and cooperative today but continues to appear significantly depressed and anxious.  Eye Contact: Good.  Speech: Appropriate in rate and volume today with no pressuring noted.  Motor Behavior: wnl.  Level of Consciousness: Alert and Oriented x 3.  Mental Status: Alert and Oriented x 3.  Mood: Severely depressed.  Affect: Severely constricted.  Anxiety Level: Severe anxiety reported.  Thought Process: Reports both auditory and visual hallucinations.  Thought Content: The patient reports ongoing auditory hallucinations instructing her to harm herself. She reports to seeing " her mother's face on the wall telling me I'm useless and I should hurt myself." She denies any delusional thinking today.  She states the  intensity of these have not improved. Perception:. Auditory and visual hallucinations continue.  Judgment: Fair.  Insight: Fair.  Cognition: Oriented to person, place and time.  Sleep:  Number of Hours: 6    Vital Signs:Blood pressure 94/64, pulse 83, temperature 96.8 F (36 C), temperature source Oral, resp. rate 20, height 5' 8.5" (1.74 m), weight 247 lb (112.038 kg).  Current Medications: Current Facility-Administered Medications  Medication Dose Route Frequency Provider Last Rate Last Dose  . alum & mag hydroxide-simeth (MAALOX/MYLANTA) 200-200-20 MG/5ML suspension 30 mL  30 mL Oral Q4H PRN Shuvon Rankin, NP      . amitriptyline (ELAVIL) tablet 50 mg  50 mg Oral QHS Curlene Labrum Readling, MD   50 mg at 02/10/12 2208  . benztropine (COGENTIN) tablet 2 mg  2 mg Oral BH-qamhs Curlene Labrum Readling, MD   2 mg at 02/11/12 0810  . divalproex (DEPAKOTE ER) 24 hr tablet 1,000 mg  1,000 mg Oral BH-qamhs Larena Sox, MD   1,000 mg at 02/11/12 0809  . haloperidol (HALDOL) tablet 2 mg  2 mg Oral BH-[id number] Verne Spurr, PA-C      . ibuprofen (ADVIL,MOTRIN) tablet 200 mg  200 mg Oral Q6H PRN Larena Sox, MD      . lamoTRIgine (LAMICTAL) tablet 150 mg  150 mg Oral QHS Curlene Labrum Readling, MD   150 mg at 02/10/12 2208  . levothyroxine (SYNTHROID, LEVOTHROID) tablet 50 mcg  50 mcg Oral QAC breakfast Curlene Labrum Readling, MD   50 mcg at 02/11/12 0656  . magnesium hydroxide (MILK OF MAGNESIA) suspension 30 mL  30  mL Oral Daily PRN Shuvon Rankin, NP      . modafinil (PROVIGIL) tablet 100 mg  100 mg Oral Q breakfast Verne Spurr, PA-C      . naproxen (NAPROSYN) tablet 250 mg  250 mg Oral BID PRN Curlene Labrum Readling, MD      . sertraline (ZOLOFT) tablet 200 mg  200 mg Oral Daily Verne Spurr, PA-C      . DISCONTD: haloperidol (HALDOL) tablet 2 mg  2 mg Oral BH-qamhs Curlene Labrum Readling, MD   2 mg at 02/11/12 0809  . DISCONTD: modafinil (PROVIGIL) tablet 200 mg  200 mg Oral Q breakfast Curlene Labrum Readling, MD   200 mg at  02/11/12 0810  . DISCONTD: OLANZapine (ZYPREXA) tablet 10 mg  10 mg Oral QHS Jorje Guild, PA-C   10 mg at 02/10/12 2208  . DISCONTD: sertraline (ZOLOFT) tablet 150 mg  150 mg Oral Daily Ronny Bacon, MD   150 mg at 02/11/12 1610   Lab Results:  Results for orders placed during the hospital encounter of 02/04/12 (from the past 48 hour(s))  AMMONIA     Status: Normal   Collection Time   02/10/12  7:24 PM      Component Value Range Comment   Ammonia 49  11 - 60 umol/L   VALPROIC ACID LEVEL     Status: Normal   Collection Time   02/10/12  7:24 PM      Component Value Range Comment   Valproic Acid Lvl 74.6  50.0 - 100.0 ug/mL   HEPATIC FUNCTION PANEL     Status: Abnormal   Collection Time   02/10/12  7:25 PM      Component Value Range Comment   Total Protein 6.7  6.0 - 8.3 g/dL    Albumin 3.3 (*) 3.5 - 5.2 g/dL    AST 43 (*) 0 - 37 U/L    ALT 53 (*) 0 - 35 U/L    Alkaline Phosphatase 64  39 - 117 U/L    Total Bilirubin 0.1 (*) 0.3 - 1.2 mg/dL    Bilirubin, Direct <9.6  0.0 - 0.3 mg/dL REPEATED TO VERIFY   Indirect Bilirubin NOT CALCULATED  0.3 - 0.9 mg/dL     Physical Findings:  AIMS: Facial and Oral Movements Muscles of Facial Expression: None, normal Lips and Perioral Area: None, normal Jaw: None, normal Tongue: None, normal,Extremity Movements Upper (arms, wrists, hands, fingers): None, normal Lower (legs, knees, ankles, toes): None, normal, Trunk Movements Neck, shoulders, hips: None, normal, Overall Severity Severity of abnormal movements (highest score from questions above): None, normal Incapacitation due to abnormal movements: None, normal Patient's awareness of abnormal movements (rate only patient's report): No Awareness, Dental Status Current problems with teeth and/or dentures?: No Does patient usually wear dentures?: No   Review of Systems:  Neurological: The patient reports hallucination today. She denies any seizures or dizziness.  G.I.: The patient denies any  constipation or stomach upset today.  Musculoskeletal: The patient denies any musculoskeletal issues today.   Diagnosis:  Axis I: Schizophrenia - Paranoid Type.  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  Posttraumatic Stress Disorder.  Panic Disorder with Agoraphobia.     Treatment Plan Summary:  1. Daily contact with patient to assess and evaluate symptoms and progress in treatment  2. Medication management  3. The patient will deny suicidal ideations or homicidal ideations for 48 hours prior to discharge and have a depression and anxiety rating of 3 or less.  The patient will also deny any auditory or visual hallucinations or delusional thinking.   Plan:  1. Will continue the medication Elavil at 50 mgs po qhs for sleep and depression.  2. Will continue the medication Cogentin to 2 mg po qam and hs for EPS.  3. Will continue the medication Depakote ER at 1000 mgs po QAM and QHS for mood stabilization.  4. Will continue the medication Lamictal at 150 mgs po qhs for mood stabilization.  5. Will increase the medication Synthroid at 75 mcg po qam for hypothyroidism.  6. Will decrease the medication Modafinil at 100 mgs po qam as patient still complains of hallucinations  7.Will discontinue Zyprexa secondary to increased appetite and also as patient has OSA 8. Will increase the medication Zoloft to 200 mgs po q am for depression and anxiety.  9. Will increase the medication Haldol to 2 mgs PO QAM, at Q 2:00 PM and QHS  to further address the patient's psychotic symptoms. 10. Laboratory studies reviewed, Free T3 and Free T4 is low and so Synthroid is increased  11. Will continue to monitor.   Zadaya Cuadra 02/11/2012, 1:21 PM

## 2012-02-11 NOTE — Progress Notes (Signed)
The focus of this group is to help patients review their daily goal of treatment and discuss progress on daily workbooks. Pt attended the evening wrap-up group and reported to staff that she did not have a good day, but that she was looking for the positives in it such as her husband coming to visit.

## 2012-02-11 NOTE — Progress Notes (Signed)
D: Code blue called at 1745 in dinning room by staff. Pt "fell to the floor" according to husband. Pt has a history of possible pseudo seizure.  A: pt alert and oriented. No seizure activity witiness by staff and husband. vitals sign as follows B/P 111/89. PR 96 02 Sat 95%. R: pt stayed behind to finish dinner with husband. Will continue to monitor vitals and observed pt.

## 2012-02-11 NOTE — Progress Notes (Signed)
Psychoeducational Group Note  Date:  02/11/2012 Time:  0930  Group Topic/Focus:  Recovery Goals:   The focus of this group is to identify appropriate goals for recovery and establish a plan to achieve them.  Participation Level: Did Not Attend  Participation Quality:  Not Applicable  Affect:  Not Applicable  Cognitive:  Not Applicable  Insight:  Not Applicable  Engagement in Group: Not Applicable  Additional Comments:  Pt refused to attend group this morning, citing she is grieving the death of her father.  Taos Tapp E 02/11/2012, 2:34 PM

## 2012-02-11 NOTE — Progress Notes (Signed)
D: Resting quietly in bed with eyes closed. Opened eyes spontaneously to name. Appears flat and depressed. Calm and cooperative with assessment. No acute distress noted. States she has had a rough day r/t the anniversary of her fathers death. Recalled the events leading up to his death and endorsed regret r/t not seeing him his last day of living and telling him goodbye. States she did have a good visit with her husband today. Continues to endorse VH, but says she feels like she sees them less frequently. Also endorses hearing the voice of her mom, but states she cant understand what she is saying. Continues to endorse passive SI but contracts for safety. Denies HI. She did attend wrap up group.  A: Support and encouragement provided. Safety has been maintained with Q15 minute observation. POC and medications for the shift reviewed and understanding verbalized. Meds given as ordered by MD. Reminded pt she is in a safe place and her hallucinations are part of her illness and are not real. Assisted with putting on her CPAP.   R: Pt remains safe. She remains flat and depressed. She is complaint with programming and compliant with medications. Will continue Q15 minute observation and continue current POC

## 2012-02-12 NOTE — Progress Notes (Signed)
[facility] Group Notes:  (Counselor/Nursing/MHT/Case Management/Adjunct)  02/12/2012 2:18 PM  Type of Therapy:  Group Therapy  02/11/12  Participation Level:  Active  Participation Quality:  Attentive and Sharing  Affect:  Depressed  Cognitive:  Hallucinating  Insight:  Limited  Engagement in Group:  Good  Engagement in Therapy:  Good  Modes of Intervention:  Clarification, Education and Support  Summary of Progress/Problems: Patient talked about today being the anniversary of her father's death. Wishes she could be with him. Counselor had patient identify some positives about her father. She talked about him fondly stating that he was always giving. She also shared a funny story about her father liking chocolate. Had to leave group due to voices.   Danielle [name], Aram Beecham 02/12/2012, 2:18 PM

## 2012-02-12 NOTE — Progress Notes (Signed)
Patient ID: RAEGAN HAGGETT, female   DOB: Oct 30, 1970, 41 y.o.   MRN: [id number] Patient ID: LAURA-LEE KRODEL, female   DOB: 16-Apr-1971, 41 y.o.   MRN: [id number] [facility] MD Progress Note  02/12/2012 10:13 AM Subjective: The patient was seen today and reports the following:   Patient is a 41 y/o woman who was admitted for psychosis. The patient reports a decrease in auditory and visual hallucinations, with command type hallucinations of her mother telling her to hurt herself.  She has not had any outbursts or any attempts at hurting herself. She report that she has continued anxiety and frustration.  She reports her sleep and appetite are good.    ADL's: Intact.  Sleep: The patient reports to sleeping well last night.  Appetite: The patient reports an improved appetite today.   Mild>(1-10) >Severe  Hopelessness (1-10): 8 Depression (1-10): 8 Anxiety (1-10): 8  Suicidal Ideation: The patient denies any suicidal ideations today. Plan: No  Intent: No  Means: No  Homicidal Ideation: The patient denies any homicidal ideations today.  Plan: No  Intent: No.  Means: No  General Appearance/Behavior:  Appropriate and cooperative today but continues to appear significantly depressed and anxious.  Eye Contact: Good.  Speech: Appropriate in rate and volume today with no pressuring noted.  Motor Behavior: wnl.  Level of Consciousness: Alert and Oriented x 3.  Mental Status: Alert and Oriented x 3.  Mood: Severely depressed.  Affect: Severely constricted.  Anxiety Level: Severe anxiety reported.  Thought Process: Reports both auditory and visual hallucinations.  Thought Content: The patient reports ongoing auditory hallucinations instructing her to harm herself. She reports to seeing " her mother's face on the wall telling me I'm useless and I should hurt myself." She denies any delusional thinking today.  She states the intensity of these have not improved. Perception:. Auditory and visual  hallucinations continue.  Judgment: Fair.  Insight: Fair.  Cognition: Oriented to person, place and time.  Sleep:  Number of Hours: 5    Vital Signs:Blood pressure 104/69, pulse 83, temperature 97.6 F (36.4 C), temperature source Oral, resp. rate 16, height 5' 8.5" (1.74 m), weight 112.038 kg (247 lb).  Current Medications: Current Facility-Administered Medications  Medication Dose Route Frequency Provider Last Rate Last Dose  . alum & mag hydroxide-simeth (MAALOX/MYLANTA) 200-200-20 MG/5ML suspension 30 mL  30 mL Oral Q4H PRN Shuvon Rankin, NP      . amitriptyline (ELAVIL) tablet 50 mg  50 mg Oral QHS Curlene Labrum Readling, MD   50 mg at 02/11/12 2202  . benztropine (COGENTIN) tablet 2 mg  2 mg Oral BH-qamhs Curlene Labrum Readling, MD   2 mg at 02/12/12 0751  . divalproex (DEPAKOTE ER) 24 hr tablet 1,000 mg  1,000 mg Oral BH-qamhs Larena Sox, MD   1,000 mg at 02/12/12 0751  . haloperidol (HALDOL) tablet 2 mg  2 mg Oral [id number] Verne Spurr, PA-C   2 mg at 02/12/12 0751  . ibuprofen (ADVIL,MOTRIN) tablet 200 mg  200 mg Oral Q6H PRN Larena Sox, MD      . lamoTRIgine (LAMICTAL) tablet 150 mg  150 mg Oral QHS Curlene Labrum Readling, MD   150 mg at 02/11/12 2202  . levothyroxine (SYNTHROID, LEVOTHROID) tablet 75 mcg  75 mcg Oral QAC breakfast Nelly Rout, MD   75 mcg at 02/12/12 0634  . magnesium hydroxide (MILK OF MAGNESIA) suspension 30 mL  30 mL Oral Daily PRN Shuvon Rankin, NP      .  modafinil (PROVIGIL) tablet 100 mg  100 mg Oral Q breakfast Verne Spurr, PA-C      . naproxen (NAPROSYN) tablet 250 mg  250 mg Oral BID PRN Curlene Labrum Readling, MD      . sertraline (ZOLOFT) tablet 200 mg  200 mg Oral Daily Verne Spurr, PA-C   200 mg at 02/12/12 0751  . DISCONTD: haloperidol (HALDOL) tablet 2 mg  2 mg Oral BH-qamhs Curlene Labrum Readling, MD   2 mg at 02/11/12 0809  . DISCONTD: levothyroxine (SYNTHROID, LEVOTHROID) tablet 50 mcg  50 mcg Oral QAC breakfast Curlene Labrum Readling, MD   50 mcg at 02/11/12  0656  . DISCONTD: modafinil (PROVIGIL) tablet 200 mg  200 mg Oral Q breakfast Curlene Labrum Readling, MD   200 mg at 02/11/12 0810  . DISCONTD: OLANZapine (ZYPREXA) tablet 10 mg  10 mg Oral QHS Jorje Guild, PA-C   10 mg at 02/10/12 2208  . DISCONTD: sertraline (ZOLOFT) tablet 150 mg  150 mg Oral Daily Ronny Bacon, MD   150 mg at 02/11/12 1610   Lab Results:  Results for orders placed during the hospital encounter of 02/04/12 (from the past 48 hour(s))  AMMONIA     Status: Normal   Collection Time   02/10/12  7:24 PM      Component Value Range Comment   Ammonia 49  11 - 60 umol/L   VALPROIC ACID LEVEL     Status: Normal   Collection Time   02/10/12  7:24 PM      Component Value Range Comment   Valproic Acid Lvl 74.6  50.0 - 100.0 ug/mL   HEPATIC FUNCTION PANEL     Status: Abnormal   Collection Time   02/10/12  7:25 PM      Component Value Range Comment   Total Protein 6.7  6.0 - 8.3 g/dL    Albumin 3.3 (*) 3.5 - 5.2 g/dL    AST 43 (*) 0 - 37 U/L    ALT 53 (*) 0 - 35 U/L    Alkaline Phosphatase 64  39 - 117 U/L    Total Bilirubin 0.1 (*) 0.3 - 1.2 mg/dL    Bilirubin, Direct <9.6  0.0 - 0.3 mg/dL REPEATED TO VERIFY   Indirect Bilirubin NOT CALCULATED  0.3 - 0.9 mg/dL     Physical Findings:  AIMS: Facial and Oral Movements Muscles of Facial Expression: None, normal Lips and Perioral Area: None, normal Jaw: None, normal Tongue: None, normal,Extremity Movements Upper (arms, wrists, hands, fingers): None, normal Lower (legs, knees, ankles, toes): None, normal, Trunk Movements Neck, shoulders, hips: None, normal, Overall Severity Severity of abnormal movements (highest score from questions above): None, normal Incapacitation due to abnormal movements: None, normal Patient's awareness of abnormal movements (rate only patient's report): No Awareness, Dental Status Current problems with teeth and/or dentures?: No Does patient usually wear dentures?: No   Review of Systems:    Neurological: The patient reports hallucination today. She denies any seizures or dizziness.  G.I.: The patient denies any constipation or stomach upset today.  Musculoskeletal: The patient denies any musculoskeletal issues today.   Diagnosis:  Axis I: Schizophrenia - Paranoid Type.  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  Posttraumatic Stress Disorder.  Panic Disorder with Agoraphobia.     Treatment Plan Summary:  1. Daily contact with patient to assess and evaluate symptoms and progress in treatment  2. Medication management  3. The patient will deny suicidal ideations or homicidal ideations  for 48 hours prior to discharge and have a depression and anxiety rating of 3 or less. The patient will also deny any auditory or visual hallucinations or delusional thinking.   Plan:  1. Will continue the medication Elavil at 50 mgs po qhs for sleep and depression.  2. Will continue the medication Cogentin to 2 mg po qam and hs for EPS.  3. Will continue the medication Depakote ER at 1000 mgs po QAM and QHS for mood stabilization.  4. Will continue the medication Lamictal at 150 mgs po qhs for mood stabilization.  5. Will continue the medication Synthroid at 75 mcg po qam for hypothyroidism.  6. Will continue the medication Modafinil at 100 mgs po qam as patient still complains of hallucinations  7. Will continue the medication Haldol to 2 mgs PO QAM, at Q 2:00 PM and QHS  to further address the patient's psychotic symptoms. 8. Will continue the medication Zoloft to 200 mgs po q am for depression and anxiety.  9. Will continue to monitor.    Laronda Lisby 02/12/2012, 10:13 AM

## 2012-02-12 NOTE — Discharge Planning (Signed)
Pt did not attend morning aftercare planning group but came to tx team. Pt had pseudo-seizure last night and reported feeling dizzy today. She thought that this happened in response to yesterday being the anniversary of her father's death. Visual/auditory hallucinations are less frequent but are still present. Pt has stutter at times and stated that her stutter gets worse when she is under stress or feeling anxious. Pt also complained of eye and lip twitching. Pt rates her anxiety and depression as 8 and hopelessness as 10. Pt asked CM to call husband to update him on her status. Cm intern spoke with husband this afternoon. He reported that on pt's best days, she is able to stay home while he goes to work and is able to drive to store when needed. Pt's husband said that pt seems to have severe mental health breakdowns that include auditory/visual hallucinations during seasonal changes and when the anniversary of her father's death nears 2023-02-15). He also noted that pt gets little sleep because she tries to stay up at night while he is at work and sleeps during the day. She also suffers from sleep apnea. He said that pt did not qualify for ACT team but would like [place] to explore outpatient therapy options for his wife that accept BC/BS insurance.

## 2012-02-12 NOTE — Progress Notes (Signed)
Patient ID: Danielle [name], female   DOB: 12-27-1970, 41 y.o.   MRN: [id number]   D: Patient lying in bed but awake when I was checking on her. States that she doesn't feel like her mood has improved a whole but not hearing the voices as much. Did state that the voices were "meaner" than they once were. Passive SI on and off but contracts. No somatic complaints at this time.  A: Staff will monitor on q 15 minute checks and follow treatments and medications as ordered. R: Cooperative with staff at this time.

## 2012-02-12 NOTE — Progress Notes (Signed)
D: Pt was resting in bed on approach. Patient appears sad and depressed. Cooperative with assessment. Pt stated "her AVH is bad, the people coming out the wall were meaner" Pt. Stated sleep was "fair ",appetite is "poor" and energy level was "low". Pt rates her depression was "9" out of 10 scale. Hopelessness was "9 " out of 10 scale.  passive SI/HI and active AVH and verbally contract for safety. Pt stated feeling dizzy and lightheaded.  A: Safety has been maintained with Q15 minutes observation. Pt encouraged to stay in bed and ask for help when needed. Supported and encouragement provided.  R: Patient remains safe. she is complaint with medications. Safety has been maintained Q15 and continue current POC.

## 2012-02-12 NOTE — Progress Notes (Signed)
[facility] Group Notes:  (Counselor/Nursing/MHT/Case Management/Adjunct)  02/12/2012 2:21 PM  Type of Therapy:  Psychoeducational Skills  Participation Level:  Minimal  Participation Quality:  Attentive  Affect:  Anxious  Cognitive:  Hallucinating  Insight:  Limited  Engagement in Group:  Limited  Engagement in Therapy:  Limited  Modes of Intervention:  Education and Support  Summary of Progress/Problems:  Patient remained in group with mental health association for only a short time. She was experiencing hallucinations and eventually left the group.   Tomer Chalmers, Aram Beecham 02/12/2012, 2:21 PM

## 2012-02-13 NOTE — Progress Notes (Addendum)
Patient ID: MARYJANE BENEDICT, female   DOB: 1970-06-16, 41 y.o.   MRN: [id number]  [facility] MD Progress Note  02/13/2012 11:19 AM Subjective: The patient was seen today and reports the following:   Patient is a 41 y/o woman who was admitted for psychosis. The patient reports a further decrease in auditory and visual hallucinations, with continued command type hallucinations of her mother telling her to hurt herself, which is decreasing in frequency and intensity.  She has not had any outbursts or any attempts at hurting herself. She reports that yesterday was the first time she went to groups. She report that she has continued anxiety and frustration.  She reports her sleep and appetite are good.  She is taking her medications and denies any side effects. The patient is less anxious with decrease in modafinil and has less anxiety.  ADL's: Intact.  Sleep: The patient reports to sleeping well last night.  Appetite: The patient reports an improved appetite today.   Mild>(1-10) >Severe  Hopelessness (1-10): 4 Depression (1-10): 5 Anxiety (1-10): 5  Suicidal Ideation: The patient denies any suicidal ideations today. Plan: No  Intent: No  Means: No  Homicidal Ideation: The patient denies any homicidal ideations today.  Plan: No  Intent: No.  Means: No  General Appearance/Behavior:  Appropriate and cooperative today but continues to appear significantly depressed and anxious.  Eye Contact: Good.  Speech: Appropriate in rate and volume today with no pressuring noted.  Motor Behavior: wnl.  Level of Consciousness: Alert and Oriented x 3.  Mental Status: Alert and Oriented x 3.  Mood: "pretty good, now, looking forward to seeing my husband tonight." Affect: Severely constricted.  Anxiety Level: Severe anxiety reported.  Thought Process: Reports both auditory and visual hallucinations.  Thought Content: The patient reports ongoing auditory hallucinations instructing her to harm herself. She  continues  reports to seeing " her mother's face on the wall telling me I'm useless and I should hurt myself." She denies any delusional thinking today.  She states the intensity of these have not improved. Perception:. Auditory and visual hallucinations continue.  Judgment: Fair.  Insight: Fair.  Cognition: Oriented to person, place and time.  Sleep:  Number of Hours: 6.5    Vital Signs:Blood pressure 93/64, pulse 16, temperature 97.5 F (36.4 C), temperature source Oral, resp. rate 16, height 5' 8.5" (1.74 m), weight 112.038 kg (247 lb).  Current Medications: Current Facility-Administered Medications  Medication Dose Route Frequency Provider Last Rate Last Dose  . alum & mag hydroxide-simeth (MAALOX/MYLANTA) 200-200-20 MG/5ML suspension 30 mL  30 mL Oral Q4H PRN Shuvon Rankin, NP      . amitriptyline (ELAVIL) tablet 50 mg  50 mg Oral QHS Curlene Labrum Readling, MD   50 mg at 02/12/12 2116  . benztropine (COGENTIN) tablet 2 mg  2 mg Oral BH-qamhs Curlene Labrum Readling, MD   2 mg at 02/13/12 0810  . divalproex (DEPAKOTE ER) 24 hr tablet 1,000 mg  1,000 mg Oral BH-qamhs Larena Sox, MD   1,000 mg at 02/13/12 0809  . haloperidol (HALDOL) tablet 2 mg  2 mg Oral BH-[id number] Verne Spurr, PA-C   2 mg at 02/13/12 0809  . ibuprofen (ADVIL,MOTRIN) tablet 200 mg  200 mg Oral Q6H PRN Larena Sox, MD      . lamoTRIgine (LAMICTAL) tablet 150 mg  150 mg Oral QHS Curlene Labrum Readling, MD   150 mg at 02/12/12 2117  . levothyroxine (SYNTHROID, LEVOTHROID) tablet 75 mcg  75 mcg Oral QAC breakfast Nelly Rout, MD   75 mcg at 02/13/12 [phone]  . magnesium hydroxide (MILK OF MAGNESIA) suspension 30 mL  30 mL Oral Daily PRN Shuvon Rankin, NP      . modafinil (PROVIGIL) tablet 100 mg  100 mg Oral Q breakfast Verne Spurr, PA-C      . naproxen (NAPROSYN) tablet 250 mg  250 mg Oral BID PRN Curlene Labrum Readling, MD   250 mg at 02/13/12 0640  . sertraline (ZOLOFT) tablet 200 mg  200 mg Oral Daily Verne Spurr, PA-C   200 mg  at 02/13/12 9604   Lab Results:  No results found for this or any previous visit (from the past 48 hour(s)).  Physical Findings:  AIMS: Facial and Oral Movements Muscles of Facial Expression: None, normal Lips and Perioral Area: None, normal Jaw: None, normal Tongue: None, normal,Extremity Movements Upper (arms, wrists, hands, fingers): None, normal Lower (legs, knees, ankles, toes): None, normal, Trunk Movements Neck, shoulders, hips: None, normal, Overall Severity Severity of abnormal movements (highest score from questions above): None, normal Incapacitation due to abnormal movements: None, normal Patient's awareness of abnormal movements (rate only patient's report): No Awareness, Dental Status Current problems with teeth and/or dentures?: No Does patient usually wear dentures?: No   Review of Systems:  Neurological: The patient reports hallucination today. She denies any seizures or dizziness.  G.I.: The patient denies any constipation or stomach upset today.  Musculoskeletal: The patient denies any musculoskeletal issues today.   Diagnosis:  Axis I: Schizophrenia - Paranoid Type.  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  Posttraumatic Stress Disorder.  Panic Disorder with Agoraphobia.     Treatment Plan Summary:  1. Daily contact with patient to assess and evaluate symptoms and progress in treatment  2. Medication management  3. The patient will deny suicidal ideations or homicidal ideations for 48 hours prior to discharge and have a depression and anxiety rating of 3 or less. The patient will also deny any auditory or visual hallucinations or delusional thinking.   Plan:  1. Will continue the medication Elavil at 50 mgs po qhs for sleep and depression.  2. Will continue the medication Cogentin to 2 mg po qam and hs for EPS.  3. Will continue the medication Depakote ER at 1000 mgs po QAM and QHS for mood stabilization.  4. Will continue the medication Lamictal  at 150 mgs po qhs for mood stabilization.  5. Will continue the medication Synthroid at 75 mcg po qam for hypothyroidism.  6. Will discontinue the medication Modafinil. 7. Will continue the medication Haldol to 2 mgs PO QAM, at Q 2:00 PM and QHS  to further address the patient's psychotic symptoms. 8. Will continue the medication Zoloft to 200 mgs po q am for depression and anxiety.  9. Will continue to monitor.    Addisen Chappelle 02/13/2012, 11:19 AM

## 2012-02-13 NOTE — Progress Notes (Signed)
D: Pt was in sleeping in bed on approach. Patient appears anxious and depressed. Calm and cooperative with assessment. Pt. Stated sleep was "fair". Appetite is "poor". Pt rates depression was "6"  And hopelessness was " 6" out of 10 scale. Patient stated upon discharged will "get out of the house more and find more support classes". Denies SI/HI.  Pt stated the "voices are meaner" with active AVH but contracst for safety at this time.  Offered no additional question or concerns  A: Safety has been maintained with Q15 minutes observation. Patient encouraged to attend groups. Supported and encouragement provided.  R: Patient remains safe. She attended group and is complaint with medications. Safety has been maintained Q15 and continue current POC.

## 2012-02-13 NOTE — Discharge Planning (Signed)
Pt attended morning aftercare planning group. She was pleasant and participated in discussion with other group members and CM. Pt reported feeling better than yesterday. She stated that her bp was low this morning and she still feels dizzy at times. Pt sees Dr. Jennette Kettle at the [facility] in [place] but had been paying out of pocket because this doctor does not accept her insurance. Pt interested in other psychiatrist and therapy options--[facility]. [facility] and was informed that pt has been discharged from this service and can not return. Another possibility is [facility] [place] location. Will talk husband this afternoon about making appointment for pt as he needs to give them a deposit.

## 2012-02-13 NOTE — Progress Notes (Signed)
Psychoeducational Group Note  Date:  02/13/2012 Time:  0930  Group Topic/Focus:  Self Esteem Action Plan:   The focus of this group is to help patients create a plan to continue to build self-esteem after discharge.  Participation Level: Did Not Attend  Participation Quality:  Not Applicable  Affect:  Not Applicable  Cognitive:  Not Applicable  Insight:  Not Applicable  Engagement in Group: Not Applicable  Additional Comments:  Pt was asleep and did not attend group this morning.  Ayshia Gramlich E 02/13/2012, 1:54 PM

## 2012-02-14 MED ORDER — HALOPERIDOL 2 MG PO TABS
2.0000 mg | ORAL_TABLET | Freq: Three times a day (TID) | ORAL | Status: DC
  Administered 2012-02-14 – 2012-02-17 (×12): 2 mg via ORAL
  Filled 2012-02-14: qty 1
  Filled 2012-02-14: qty 2
  Filled 2012-02-14 (×18): qty 1
  Filled 2012-02-14: qty 2

## 2012-02-14 NOTE — Progress Notes (Signed)
D: Patient appears sad and depressed. Cooperative with assessment. Pt. Stated sleep was "well". Appetite is "improving". Pt rates depression as "5 " and hopelessness as "5" out of 10 scale. As pt. walked into treatment team she panicked and was escorted back to her room. Pt was hyperventilating and stated "they coming to get me"  Denies SI/HI/ but stated decrease in AV hallucination. Patient contract for safety at this time. Offered no additional question or concerns  A:  Patient was instructed to slow breathing. RN stayed with pt untill breathing returned to normal. Safety has been maintained with Q15 minutes observation. Supported and encouragement provided.  R: Patient remains safe. she is complaint with medications. Safety has been maintained Q15 and continue current POC.

## 2012-02-14 NOTE — Progress Notes (Signed)
Discharge appointment set for November 5,2013 at 1100 with Dr, Lolly Mustache in the [place] office.

## 2012-02-14 NOTE — Progress Notes (Signed)
Psychoeducational Group Note  Date:  02/14/2012 Time:  2000 Group Topic/Focus:  Karaoke night   Participation Level:  Did not attend   Participation Quality:  Did not attend   Affect:  Did not attend   Cognitive:  Did not attend   Insight:  Did not attend   Engagement in Group:  Did not attend   Additional Comments:  Pt didn't attend karaoke this evening.   Shirleen Mcfaul A 02/14/2012, 3:25 AM

## 2012-02-14 NOTE — Progress Notes (Addendum)
Patient ID: Danielle [name], female   DOB: 1970/09/01, 41 y.o.   MRN: [id number] [facility] MD Progress Note  02/14/2012 9:50 AM Subjective: The patient was seen today and reports the following:   Patient is a 41 y/o woman who was admitted for psychosis. The patient reports a further decrease in auditory and visual hallucinations, with continued command type hallucinations of her mother telling her to hurt herself, which is decreasing in frequency and intensity.  She has not had any outbursts or any attempts at hurting herself. She reports that yesterday was the first time she went to groups. She report that she has continued anxiety and frustration.  She reports her sleep and appetite are good.  She is taking her medications and denies any side effects.   She had a severe anxiety attack while in treat ADL's: Intact.  Sleep: The patient reports to sleeping well last night.  Appetite: The patient reports an improved appetite today.   Mild>(1-10) >Severe  Hopelessness (1-10): 4-5 Depression (1-10): 4-5 Anxiety (1-10): 10 earlier today but now   Suicidal Ideation: The patient denies any suicidal ideations today. Plan: No  Intent: No  Means: No  Homicidal Ideation: The patient denies any homicidal ideations today.  Plan: No  Intent: No.  Means: No  General Appearance/Behavior:  Appropriate and cooperative today but continues to appear significantly depressed and anxious.  Eye Contact: Good.  Speech: Appropriate in rate and volume today with no pressuring noted.  Motor Behavior: wnl.  Level of Consciousness: Alert and Oriented x 3.  Mental Status: Alert and Oriented x 3.  Mood: "anxious but good" Affect: Severely constricted.  Anxiety Level: Severe anxiety reported.  Thought Process: Reports both auditory and visual hallucinations.  Thought Content: The patient reports ongoing auditory hallucinations instructing her to harm herself. She continues  reports to seeing " her mother's face on the  wall telling me I'm useless and I should hurt myself." She denies any delusional thinking today.  She states the intensity of these have not improved. Perception:. Auditory and visual hallucinations continue.  Judgment: Fair.  Insight: Fair.  Cognition: Oriented to person, place and time.  Sleep:  Number of Hours: 6.25    Vital Signs:Blood pressure 102/69, pulse 91, temperature 97.5 F (36.4 C), temperature source Oral, resp. rate 16, height 5' 8.5" (1.74 m), weight 247 lb (112.038 kg).  Current Medications: Current Facility-Administered Medications  Medication Dose Route Frequency Provider Last Rate Last Dose  . alum & mag hydroxide-simeth (MAALOX/MYLANTA) 200-200-20 MG/5ML suspension 30 mL  30 mL Oral Q4H PRN Shuvon Rankin, NP      . amitriptyline (ELAVIL) tablet 50 mg  50 mg Oral QHS Curlene Labrum Readling, MD   50 mg at 02/13/12 2152  . benztropine (COGENTIN) tablet 2 mg  2 mg Oral BH-qamhs Curlene Labrum Readling, MD   2 mg at 02/14/12 0820  . divalproex (DEPAKOTE ER) 24 hr tablet 1,000 mg  1,000 mg Oral BH-qamhs Larena Sox, MD   1,000 mg at 02/14/12 0819  . haloperidol (HALDOL) tablet 2 mg  2 mg Oral [id number] Verne Spurr, PA-C   2 mg at 02/14/12 0820  . ibuprofen (ADVIL,MOTRIN) tablet 200 mg  200 mg Oral Q6H PRN Larena Sox, MD      . lamoTRIgine (LAMICTAL) tablet 150 mg  150 mg Oral QHS Curlene Labrum Readling, MD   150 mg at 02/13/12 2152  . levothyroxine (SYNTHROID, LEVOTHROID) tablet 75 mcg  75 mcg Oral QAC breakfast Archana  Lucianne Muss, MD   75 mcg at 02/14/12 0610  . magnesium hydroxide (MILK OF MAGNESIA) suspension 30 mL  30 mL Oral Daily PRN Shuvon Rankin, NP      . naproxen (NAPROSYN) tablet 250 mg  250 mg Oral BID PRN Curlene Labrum Readling, MD   250 mg at 02/13/12 0640  . sertraline (ZOLOFT) tablet 200 mg  200 mg Oral Daily Verne Spurr, PA-C   200 mg at 02/14/12 0820  . DISCONTD: modafinil (PROVIGIL) tablet 100 mg  100 mg Oral Q breakfast Verne Spurr, PA-C       Lab Results:  No  results found for this or any previous visit (from the past 48 hour(s)).  Physical Findings:  AIMS: Facial and Oral Movements Muscles of Facial Expression: None, normal Lips and Perioral Area: None, normal Jaw: None, normal Tongue: None, normal,Extremity Movements Upper (arms, wrists, hands, fingers): None, normal Lower (legs, knees, ankles, toes): None, normal, Trunk Movements Neck, shoulders, hips: None, normal, Overall Severity Severity of abnormal movements (highest score from questions above): None, normal Incapacitation due to abnormal movements: None, normal Patient's awareness of abnormal movements (rate only patient's report): No Awareness, Dental Status Current problems with teeth and/or dentures?: No Does patient usually wear dentures?: No   Review of Systems:  Neurological: The patient reports hallucination today. She denies any seizures or dizziness.  G.I.: The patient denies any constipation or stomach upset today.  Musculoskeletal: The patient denies any musculoskeletal issues today.   Diagnosis:  Axis I: Schizophrenia - Paranoid Type.  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  Posttraumatic Stress Disorder.  Panic Disorder with Agoraphobia.     Treatment Plan Summary:  1. Daily contact with patient to assess and evaluate symptoms and progress in treatment  2. Medication management  3. The patient will deny suicidal ideations or homicidal ideations for 48 hours prior to discharge and have a depression and anxiety rating of 3 or less. The patient will also deny any auditory or visual hallucinations or delusional thinking.   Plan:  1. Will continue the medication Elavil at 50 mgs po qhs for sleep and depression.  2. Will continue the medication Cogentin to 2 mg po qam and hs for EPS.  3. Will continue the medication Depakote ER at 1000 mgs po QAM and QHS for mood stabilization.  4. Will continue the medication Lamictal at 150 mgs po qhs for mood  stabilization.  5. Will continue the medication Synthroid at 75 mcg po qam for hypothyroidism.  6.  Will continue the medication Zoloft to 200 mgs po q am for depression and anxiety.  7. Will have a trial increase of  the medication Haldol to 2 mgs PO QAM, at Q 2:00 PM 5:00 PM  and QHS  to further address the patient's psychotic symptoms. I have asked the patient to tell provider tomorrow if the extra Haldol dosage was too sedating 8.  Will continue to monitor.  Kijana Estock 02/14/2012, 9:50 AM

## 2012-02-14 NOTE — Progress Notes (Signed)
D   Pt reports feeling better   She is less anxious and depressed  She was able to discuss medications and tell me what she takes them for   She is cooperative and pleasant but interacts minimally with others A   Verbal support given  Medications administered and effectiveness monitored  Q 15 min checks  R   Pt safe at present

## 2012-02-14 NOTE — Progress Notes (Signed)
[facility] Group Notes:  (Counselor/Nursing/MHT/Case Management/Adjunct)  02/14/2012 11:35 AM  Type of Therapy:  Group Therapy  Participation Level:  Minimal  Participation Quality:  Attentive and Sharing  Affect:  Anxious  Cognitive:  Hallucinating  Insight:  Limited  Engagement in Group:  Good  Engagement in Therapy:  Good  Modes of Intervention:  Clarification, Education and Support  Summary of Progress/Problems: Patient reported feeling a little better with the voice of her mother getting down to a minimal. She also stated that the things jumping out of the wall were less. Feeling hopeful that medications were working. Talked about episodes at home where she would be fine one minute, and the next, things would be bad. Patient had to leave group early due to seeing the doctor.   Kyren Knick, Aram Beecham 02/14/2012, 11:35 AM

## 2012-02-14 NOTE — Discharge Planning (Signed)
Pt did not attend morning aftercare planning group. When entering tx team, pt panicked and was escorted to her room. CM intern visited with pt this afternoon. Pt stated that meds do not seem to be working and she is much worse than yesterday. Pt said that everything scares her and her anxiety is "off the charts." Cm intern and pt discussed various coping mechanisms for dealing with intense fear--breathing exercises, counting items, redirecting thoughts, and practiced meditation. Pt appeared to calm down when talking about family or active in conversation but would quickly decompensate when not actively in conversation. Pt said that she and husband did not like that [facility] wanted a credit card on file before scheduling appt and requested another referral. Will call Dr. Lolly Mustache and counseling in outpatient and discuss options.
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
## 2012-02-14 NOTE — Progress Notes (Signed)
Psychoeducational Group Note  Date:  02/14/2012 Time:  2000  Group Topic/Focus:  Wrap-Up Group:   The focus of this group is to help patients review their daily goal of treatment and discuss progress on daily workbooks.  Participation Level:  Minimal  Participation Quality:  Appropriate  Affect:  Appropriate  Cognitive:  Appropriate  Insight:  Limited  Engagement in Group:  Limited  Additional Comments:  Pt attended wrap up group but didn't share much in group.                                                           Americus Scheurich A 02/14/2012, 10:37 PM

## 2012-02-15 NOTE — Progress Notes (Signed)
Patient ID: Danielle [name], female   DOB: 02/24/1971, 41 y.o.   MRN: [id number] D. The patient isolated most of the evening in her room. Stated that she had a bad day because she had an outburst during treatment team meeting. States that she is still having a/v hallucinations, but they are getting better. A. Met with patient 1:1. Encouraged to attend evening wrap up group. Had patient identify some positive coping skills and support system. Reviewed and administered HS medications. R. The patient did not attend evening wrap up group. Could not identify any positive coping skills. Named her husband as her entire support system.

## 2012-02-15 NOTE — Progress Notes (Signed)
Psychoeducational Group Note  Date:  02/15/2012 Time:  0945 am  Group Topic/Focus:  Identifying Needs:   The focus of this group is to help patients identify their personal needs that have been historically problematic and identify healthy behaviors to address their needs.  Participation Level:  Did Not Attend    Andrena Mews 02/15/2012,1:07 PM

## 2012-02-15 NOTE — Progress Notes (Signed)
  Danielle [name] is a 41 y.o. female [id number] 10/06/1970  02/04/2012 Principal Problem:  *Schizophrenia, paranoid type Active Problems:  Panic disorder with agoraphobia  Major depressive disorder, recurrent  Post traumatic stress disorder (PTSD)  Generalized anxiety disorder   Mental Status: Mood is better denies SI/HI AVH are pretty much gone and the occasional AH is no longer mean.   Subjective/Objective: Says this disease "sucks" but overall is just frustrated that there is not one pill/shot etc. To "fix it".   Filed Vitals:   02/15/12 0933  BP: 113/81  Pulse: 90  Temp:   Resp:     Lab Results:   BMET    Component Value Date/Time   NA 136 02/04/2012 1332   K 4.5 02/04/2012 1332   CL 100 02/04/2012 1332   CO2 23 02/04/2012 1332   GLUCOSE 120* 02/04/2012 1332   BUN 15 02/04/2012 1332   CREATININE 0.84 02/04/2012 1332   CALCIUM 9.6 02/04/2012 1332   GFRNONAA 84* 02/04/2012 1332   GFRAA >90 02/04/2012 1332    Medications:  Scheduled:     . amitriptyline  50 mg Oral QHS  . benztropine  2 mg Oral BH-qamhs  . divalproex  1,000 mg Oral BH-qamhs  . haloperidol  2 mg Oral TID WC & HS  . lamoTRIgine  150 mg Oral QHS  . levothyroxine  75 mcg Oral QAC breakfast  . sertraline  200 mg Oral Daily  . DISCONTD: haloperidol  2 mg Oral [id number]     PRN Meds alum & mag hydroxide-simeth, ibuprofen, magnesium hydroxide, naproxen  Plan: continue current plan of care. Dep level 74.6 10/7   Shashwat Cleary,[name] 02/15/2012

## 2012-02-15 NOTE — Progress Notes (Signed)
[facility] Family/Significant Other Suicide Prevention Education  Suicide Prevention Education:  Education Completed; [name]- [phone]-pt.'s husband- has been identified by the patient as the family member/significant other with whom the patient will be residing, and identified as the person(s) who will aid the patient in the event of a mental health crisis (suicidal ideations/suicide attempt).  With written consent from the patient, the family member/significant other has been provided the following suicide prevention education, prior to the and/or following the discharge of the patient.  The suicide prevention education  n provided includes the following:  Suicide risk factors  Suicide prevention and interventions  National Suicide Hotline telephone number  [facility] assessment telephone number  [facility] Emergency Assistance 911  [facility] and/or Residential Mobile Crisis Unit telephone number  Request made of family/significant other to:  Remove weapons (e.g., guns, rifles, knives), all items previously/currently identified as safety concern. Pt.'s husband states that there are no guns or weapons in the home. Pt.'s husband will make sure the home is secure.     Remove drugs/medications (over-the-counter, prescriptions, illicit drugs), all items previously/currently identified as a safety concern. Pt.'s  Husband administers and monitor's the pt.'s medication and had no concerns.  Pt.'s husband states the pt. has past history of SI thoughts but no SI attempts . Pt.'s husband is supportive and can be reached at number above. He works 3rd shift and  can be reacher between 4-5 pm. Pt.'s husband tried to call Aram Beecham back on Friday but states she had already left. Pt. Husband had questions about d/c and was told he would be contacted next week.  The family member/significant other verbalizes understanding of the suicide prevention education information  provided.  The family member/significant other agrees to remove the items of safety concern listed above.  Lamar Blinks Brewer 02/15/2012, 10:18 AM

## 2012-02-15 NOTE — Progress Notes (Signed)
Patient ID: Danielle [name], female   DOB: January 22, 1971, 41 y.o.   MRN: [id number] D: Pt is awake and active on the unit this AM. Pt denies SI/HI but endorses A/V hallucinations. Pt states that she sees monsters coming out of the walls and hears voices. She does not appear to be responding to internal stimuli. Pt is participating in the milieu and is cooperative with staff. Pt rates their depression at 4 and hopelessness at 4. Pt mood is anxious and her affect is flat/sad. Pt goal today was to attend groups but she has not been so far today.   A: Writer offered self, utilized therapeutic communication and administered medication per MD orders. Writer also encouraged pt to discuss feelings with staff and attend groups.   R: Pt is attending groups and tolerating medications well. Writer will continue to monitor. 15 minute checks are ongoing for safety.

## 2012-02-15 NOTE — Progress Notes (Signed)
Patient ID: Danielle [name], female   DOB: 05/02/1971, 41 y.o.   MRN: [id number] 1:1 Nursing Note: The patient remains resting with his eyes closed on his mattress on the floor for safety. No distress noted. 1:1 maintained for safety. Will continue to monitor.

## 2012-02-15 NOTE — Progress Notes (Signed)
Patient ID: Danielle [name], female   DOB: 08-27-1970, 41 y.o.   MRN: [id number]   [facility] Group Notes:  (Counselor/Nursing/MHT/Case Management/Adjunct)  02/15/2012 11 AM  Type of Therapy:  Aftercare Planning, Group Therapy, Dance/Movement Therapy   Participation Level:  Did Not Attend   [name] 02/15/2012. 11:39 AM

## 2012-02-16 NOTE — Progress Notes (Signed)
Patient ID: Danielle [name], female   DOB: Nov 22, 1970, 41 y.o.   MRN: [id number]   [facility] Group Notes:  (Counselor/Nursing/MHT/Case Management/Adjunct)  02/16/2012 11 AM  Type of Therapy:  Aftercare Planning, Group Therapy, Dance/Movement Therapy   Participation Level:  Active  Participation Quality:  Appropriate  Affect:  Appropriate  Cognitive:  Appropriate  Insight:  Limited  Engagement in Group:  Limited  Engagement in Therapy:  Limited  Modes of Intervention:  Clarification, Problem-solving, Role-play, Socialization and Support  Summary of Progress/Problems: After Care: Pt. attended and participated in aftercare planning group. Pt shared that she is feeling better today. Pt shared that she plans to return home, go to groups at the [facility], and follow-up with her psychiatrist.   Counseling:  Therapist and group members discussed healthy support systems. Pt shared that her husband is a great support for her and he has been to visit every day she has been here.   [name] 02/16/2012. 11:52 AM

## 2012-02-16 NOTE — Progress Notes (Signed)
  Danielle [name] is a 41 y.o. female [id number] 10/17/1970  02/04/2012 Principal Problem:  *Schizophrenia, paranoid type Active Problems:  Panic disorder with agoraphobia  Major depressive disorder, recurrent  Post traumatic stress disorder (PTSD)  Generalized anxiety disorder   Mental Status: Mood calm affect has a normal range  Denies SI/HI/AVH at this time.    Subjective/Objective: Wants a therapist and an activity. Lives with husband and 2 sons 15 & 77. Doesn't leave the house.  Gave her info about [address] on [address] and she was quite interested.    Filed Vitals:   02/16/12 0701  BP: 109/80  Pulse: 88  Temp:   Resp:     Lab Results:   BMET    Component Value Date/Time   NA 136 02/04/2012 1332   K 4.5 02/04/2012 1332   CL 100 02/04/2012 1332   CO2 23 02/04/2012 1332   GLUCOSE 120* 02/04/2012 1332   BUN 15 02/04/2012 1332   CREATININE 0.84 02/04/2012 1332   CALCIUM 9.6 02/04/2012 1332   GFRNONAA 84* 02/04/2012 1332   GFRAA >90 02/04/2012 1332    Medications:  Scheduled:     . amitriptyline  50 mg Oral QHS  . benztropine  2 mg Oral BH-qamhs  . divalproex  1,000 mg Oral BH-qamhs  . haloperidol  2 mg Oral TID WC & HS  . lamoTRIgine  150 mg Oral QHS  . levothyroxine  75 mcg Oral QAC breakfast  . sertraline  200 mg Oral Daily     PRN Meds alum & mag hydroxide-simeth, ibuprofen, magnesium hydroxide, naproxen  PLan: continue current plan of care no med changes indicated. Danielle [name],[name] 02/16/2012

## 2012-02-16 NOTE — Progress Notes (Signed)
Patient ID: Danielle [name], female   DOB: 1970-12-09, 41 y.o.   MRN: [id number] D. The patient spent the evening in her room in bed. She did not attend evening wrap up group. However, when she came for her HS medications she was more animated this evening. Smiled when interacting with staff and said her day was "okay". A. Attempted to get patient to evening wrap up group. Administered HS medications. R. The patient slept through group time and did not attend. Compliant with medications.

## 2012-02-16 NOTE — Progress Notes (Signed)
Psychoeducational Group Note  Date:  02/16/2012 Time:  2000  Group Topic/Focus:  Wrap-Up Group:   The focus of this group is to help patients review their daily goal of treatment and discuss progress on daily workbooks.  Participation Level:  Active  Participation Quality:  Appropriate  Affect:  Appropriate  Cognitive:  Appropriate  Insight:  Good  Engagement in Group:  Good  Additional Comments:  Patient attended and participated in group tonight. She reports that her day went well. She visited with her husband and she attended all her groups. She identified her husband as her support system.  Lita Mains [facility] 02/16/2012, 9:33 PM

## 2012-02-16 NOTE — Progress Notes (Signed)
Patient ID: Danielle [name], female   DOB: [birth year], 41 y.o.   MRN: [id number] D: Pt is awake and active on the unit this AM. Pt denies SI/HI and A/V hallucinations. Pt is participating in the milieu and is cooperative with staff. Pt rates their depression at and hopelessness at . Pt mood is appropriate/happy this AM and her affect is also appropriate. Pt appearance is much improved and she has no complaints this AM. Pt states that she will attend groups.  A: Writer offered self, utilized therapeutic communication and administered medication per MD orders. Writer also encouraged pt to discuss feelings with staff. Writer encouraged pt to attend groups and requested that MHT remind her when they begin.   R: Pt is attending groups and tolerating medications well. Writer will continue to monitor. 15 minute checks are ongoing for safety. Pt forwards little but is in good spirits.

## 2012-02-16 NOTE — Progress Notes (Signed)
Psychoeducational Group Note  Date:  02/16/2012 Time:  0945 am  Group Topic/Focus:  Making Healthy Choices:   The focus of this group is to help patients identify negative/unhealthy choices they were using prior to admission and identify positive/healthier coping strategies to replace them upon discharge.  Participation Level:  Active  Participation Quality:  Appropriate  Affect:  Appropriate  Cognitive:  Appropriate  Insight:  Good  Engagement in Group:  Good  Additional Comments:    Andrena Mews 02/16/2012, 10:29 AM

## 2012-02-17 DIAGNOSIS — F250 Schizoaffective disorder, bipolar type: Secondary | ICD-10-CM | POA: Diagnosis present

## 2012-02-17 DIAGNOSIS — F3130 Bipolar disorder, current episode depressed, mild or moderate severity, unspecified: Secondary | ICD-10-CM

## 2012-02-17 MED ORDER — LAMOTRIGINE 150 MG PO TABS: 150.0000 mg | ORAL_TABLET | Freq: Every day | ORAL | Status: DC

## 2012-02-17 MED ORDER — LEVOTHYROXINE SODIUM 75 MCG PO TABS: 75.0000 ug | ORAL_TABLET | Freq: Every day | ORAL | Status: DC

## 2012-02-17 MED ORDER — MODAFINIL 200 MG PO TABS: 100.0000 mg | ORAL_TABLET | Freq: Every morning | ORAL | Status: DC

## 2012-02-17 MED ORDER — SERTRALINE HCL 100 MG PO TABS: 150.0000 mg | ORAL_TABLET | Freq: Every day | ORAL | Status: DC

## 2012-02-17 MED ORDER — BENZTROPINE MESYLATE 2 MG PO TABS: 2.0000 mg | ORAL_TABLET | ORAL | Status: DC

## 2012-02-17 MED ORDER — HALOPERIDOL 2 MG PO TABS: 2.0000 mg | ORAL_TABLET | Freq: Three times a day (TID) | ORAL | Status: DC

## 2012-02-17 MED ORDER — DIVALPROEX SODIUM ER 500 MG PO TB24: 1000.0000 mg | ORAL_TABLET | ORAL | Status: DC

## 2012-02-17 MED ORDER — AMITRIPTYLINE HCL 50 MG PO TABS: 50.0000 mg | ORAL_TABLET | Freq: Every day | ORAL | Status: DC

## 2012-02-17 NOTE — Progress Notes (Signed)
Patient ID: Danielle [name], female   DOB: 05-22-1970, 41 y.o.   MRN: [id number] D. The patient was bright and social this evening. Without prompting came to evening wrap up group and actively participated. Spontaneously smiled and stated she a good day. Identified her husband and in-laws as her support system. Expressed an interest in going to [facility] after discharge. A. Met with patient 1:1 for shift assessment. Encouraged to speak with treatment team regarding discharge plans and referrals. Administered HS medications. Applied c-pap mask for sleep. R. Denied any a/v hallucinations at present. Denies any suicidal ideation. Compliant with medications.

## 2012-02-17 NOTE — Progress Notes (Signed)
Patient ID: Danielle [name], female   DOB: 30-Jul-1970, 41 y.o.   MRN: [id number] Discharge Note: Patient discharged to husband. Discharged instructions given verbally and patient provided with written copy. Patient signed and agreed to f/u appointments and medications regimen. Patient denies SI/HI and denies AVH. Patient denies pain and show no s/s of distress. Patient discharged with all belongings she had on the milieu. No belonging in locker. Patient stated understanding of discharge instructions. Patient denied med prescription samples.

## 2012-02-17 NOTE — Discharge Summary (Signed)
Patient:  Danielle [name] is an 41 y.o., female MRN:  [id number] DOB:  1970/12/21 Patient phone:  [phone] (home)  Patient address:   [address] [place] [address]   Date of Admission:  02/04/2012 Date of Discharge: 02/17/2012 13  Discharge Diagnoses: Principal Problem:  *Schizophrenia, paranoid type Active Problems:  Panic disorder with agoraphobia  Major depressive disorder, recurrent  Post traumatic stress disorder (PTSD)  Generalized anxiety disorder  Axis Diagnosis:  Diagnosis:  Axis I: Schizophrenia - Paranoid Type.  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  Posttraumatic Stress Disorder.  Panic Disorder with Agoraphobia.   Level of Care:  IOP  Hospital Course: Adeleigh was admitted with psychosis for crisis management, and stabilization.  Hospital Course:      The duration of stay was 13 days.      The patient was seen and evaluated by the Treatment team consisting of Psychiatrist, PAC, RN, Case Manager, and Therapist for evaluation and treatment plan with goal of stabilization upon discharge. The patient's physical and mental health problems were identified and treated appropriately.      Multiple modalities of treatment were used including medication, individual and group therapies, unit programming, improved nutrition, physical activity, and family sessions as needed.     The symptoms of psychosis were monitored daily by evaluation by clinical provider. The patient's mental and emotional status was evaluated by a daily self inventory completed by the patient.      Improvement was demonstrated by declining numbers on the self assessment, improving vital signs, increased cognition, and improvement in mood, sleep, appetite as well as a reduction in physical symptoms.       The patient was evaluated and found to be stable enough for discharge and was released to go home with plans to start IOP on Wednesday of this same week.  She will also be following  up with Dr. Lolly Mustache in November for care after completion of her IOP.  Mental Status Exam:  For mental status exam please see mental status exam and  suicide risk assessment completed by attending physician prior to discharge.  Medication List  STOP taking these medications         naproxen sodium 220 MG tablet   Commonly known as: ANAPROX      risperiDONE 2 MG tablet   Commonly known as: RISPERDAL    TAKE these medications      Indication    amitriptyline 50 MG tablet   Commonly known as: ELAVIL   Take 1 tablet (50 mg total) by mouth at bedtime. For depression and insomnia.    Indication: Depression/Anxiety resulting From Chronic Physical Illness      benztropine 2 MG tablet   Commonly known as: COGENTIN   Take 1 tablet (2 mg total) by mouth 2 (two) times daily in the am and at bedtime.. For side effects.    Indication: Extrapyramidal Reaction caused by Medications      divalproex 500 MG 24 hr tablet   Commonly known as: DEPAKOTE ER   Take 2 tablets (1,000 mg total) by mouth 2 (two) times daily in the am and at bedtime.. For mood stability and seizure prevention.       haloperidol 2 MG tablet   Commonly known as: HALDOL   Take 1 tablet (2 mg total) by mouth 4 (four) times daily -  with meals and at bedtime. For psychosis.    Indication: Psychosis      lamoTRIgine 150 MG  tablet   Commonly known as: LAMICTAL   Take 1 tablet (150 mg total) by mouth at bedtime. For mood stability    Indication: Depression      levothyroxine 75 MCG tablet   Commonly known as: SYNTHROID, LEVOTHROID   Take 1 tablet (75 mcg total) by mouth daily before breakfast. For hypothyroidism.    Indication: Underactive Thyroid      modafinil 200 MG tablet   Commonly known as: PROVIGIL   Take 0.5 tablets (100 mg total) by mouth every morning. For daytime sleepiness.       sertraline 100 MG tablet   Commonly known as: ZOLOFT   Take 1.5 tablets (150 mg total) by mouth daily. For depression and anxiety.      Indication: Anxiety Disorder       They were also enrolled in group counseling sessions and activities in which they participated actively.   Follow-up Information    Follow up with Dr. Lolly Mustache -  [facility] [place] Office. On 03/10/2012. (Please arrive in our outpatient department at 10:30 for an 11:00 AM appointment. IPaperwork to be completed.)    Contact information:   [facility] Outpatient  [address] [place] [address] [phone]      Follow up with [facility]. On 02/20/2012. (Arrive at 8:45, Thursday, to fill out paperwork)    Contact information:   same [address] Filter is the casemanager      Follow up with [employer].   Contact information:   I called them and they can work with your insurance if you decide to attend PSR there. No cost to you.  The number for [name] is [phone].    Upon discharge, patient adamantly denies suicidal, homicidal ideations, auditory, visual hallucinations and or delusional thinking. They left [facility] with all personal belongings via private personal transportation in no apparent distress. Comments: Activity: increase as tolerated. Diet: resume a heart healthy diet. Tests: follow up with your psychiatrist for a repeat depakote level. Follow up:  Keep all of your follow up appointments as scheduled. Signed: Rona Ravens. Shaneka Efaw [facility] 02/17/2012 3:25 PM

## 2012-02-17 NOTE — Progress Notes (Signed)
[facility] Case Management Discharge Plan:  Will you be returning to the same living situation after discharge: Yes,  home At discharge, do you have transportation home?:Yes,  husband Do you have the ability to pay for your medications:Yes,  insurance  Interagency Information:     Release of information consent forms completed and in the chart;  Patient's signature needed at discharge.  Patient to Follow up at:  Follow-up Information    Follow up with Dr. Lolly Mustache -  [facility]  Office. On 03/10/2012. (Please arrive in our outpatient department at 10:30 for an 11:00 AM appointment. IPaperwork to be completed.)    Contact information:   [facility]  [address] [place] [address] [phone]      Follow up with [facility] Intensive Outpt Program. On 02/20/2012. (Arrive at 8:45, Thursday, to fill out paperwork)    Contact information:   same [address] Filter is the casemanager      Follow up with [employer].   Contact information:   I called them and they can work with your insurance if you decide to attend PSR there. No cost to you.  The number for [name] is [phone].         Patient denies SI/HI:   Yes,  yes    Safety Planning and Suicide Prevention discussed:  Yes,  yes  Barrier to discharge identified:No.  Summary and Recommendations:   Danielle [name] 02/17/2012, 2:34 PM

## 2012-02-17 NOTE — [facility] Suicide Risk Assessment (Signed)
Suicide Risk Assessment  Discharge Assessment     Demographic Factors:  Caucasian and Unemployed  Mental Status Per Nursing Assessment::   On Admission:   (currently denies)  Current Mental Status by Physician: None  Loss Factors: None  Historical Factors: Anniversary of important loss  Risk Reduction Factors:   Responsible for children under 43 years of age, Sense of responsibility to family, Living with another person, especially a relative, Positive social support, Positive therapeutic relationship and Positive coping skills or problem solving skills  Continued Clinical Symptoms:  Bipolar Disorder:   Depressive phase  Cognitive Features That Contribute To Risk: None     Suicide Risk:  Minimal: No identifiable suicidal ideation.  Patients presenting with no risk factors but with morbid ruminations; may be classified as minimal risk based on the severity of the depressive symptoms  Discharge Diagnoses:   AXIS I:  Bipolar, Depressed AXIS II:  No diagnosis AXIS III:   Past Medical History  Diagnosis Date  . Depressed   . Seizures   . Heart murmur   . Hypothyroidism   . Anemia   . Headache   . Anxiety   . PTSD (post-traumatic stress disorder)   . Shortness of breath    AXIS IV:  economic problems and housing problems AXIS V:  51-60 moderate symptoms  Plan Of Care/Follow-up recommendations:  Activity:  As tolerated  Is patient on multiple antipsychotic therapies at discharge:  No   Has Patient had three or more failed trials of antipsychotic monotherapy by history:  No  Recommended Plan for Multiple Antipsychotic Therapies: N/A   Danielle [name] A 02/17/2012, 3:43 PM

## 2012-02-18 NOTE — Progress Notes (Signed)
Patient Discharge Instructions:  After Visit Summary (AVS):   Access to EMR:  02/18/12 Discharge Summary Note:   Access to EMR:  02/18/12 Psychiatric Admission Assessment Note:   Access to EMR:  02/18/12 Suicide Risk Assessment - Discharge Assessment:   Access to EMR:  02/18/12 Faxed/Sent to the Next Level Care provider:  02/18/12  EMR to Dr. Berton Mount in [facility] OP  Karleen Hampshire Brittini, 02/18/2012, 3:52 PM

## 2012-03-10 ENCOUNTER — Ambulatory Visit (HOSPITAL_COMMUNITY): Payer: Self-pay | Admitting: Psychiatry

## 2012-03-17 ENCOUNTER — Other Ambulatory Visit (HOSPITAL_COMMUNITY): Payer: Self-pay | Admitting: Physician Assistant

## 2012-03-30 ENCOUNTER — Encounter (HOSPITAL_COMMUNITY): Payer: Self-pay | Admitting: Psychiatry

## 2012-03-30 ENCOUNTER — Ambulatory Visit (INDEPENDENT_AMBULATORY_CARE_PROVIDER_SITE_OTHER): Payer: BC Managed Care – PPO | Admitting: Psychiatry

## 2012-03-30 VITALS — BP 117/81 | HR 89 | Wt 240.6 lb

## 2012-03-30 DIAGNOSIS — F4310 Post-traumatic stress disorder, unspecified: Secondary | ICD-10-CM

## 2012-03-30 DIAGNOSIS — F200 Paranoid schizophrenia: Secondary | ICD-10-CM

## 2012-03-30 MED ORDER — LAMOTRIGINE 150 MG PO TABS: 150.0000 mg | ORAL_TABLET | Freq: Every day | ORAL | Status: DC

## 2012-03-30 MED ORDER — HALOPERIDOL 2 MG PO TABS: 2.0000 mg | ORAL_TABLET | Freq: Three times a day (TID) | ORAL | Status: DC

## 2012-03-30 MED ORDER — AMITRIPTYLINE HCL 50 MG PO TABS: 50.0000 mg | ORAL_TABLET | Freq: Every day | ORAL | Status: DC

## 2012-03-30 MED ORDER — BENZTROPINE MESYLATE 2 MG PO TABS: 2.0000 mg | ORAL_TABLET | ORAL | Status: DC

## 2012-03-30 MED ORDER — SERTRALINE HCL 100 MG PO TABS: 150.0000 mg | ORAL_TABLET | Freq: Every day | ORAL | Status: DC
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
## 2012-04-20 ENCOUNTER — Ambulatory Visit (HOSPITAL_COMMUNITY): Payer: Self-pay | Admitting: Psychiatry

## 2012-04-21 ENCOUNTER — Encounter (HOSPITAL_COMMUNITY): Payer: Self-pay

## 2012-04-23 ENCOUNTER — Encounter (HOSPITAL_COMMUNITY): Payer: Self-pay | Admitting: Psychiatry

## 2012-04-23 ENCOUNTER — Ambulatory Visit (INDEPENDENT_AMBULATORY_CARE_PROVIDER_SITE_OTHER): Payer: BC Managed Care – PPO | Admitting: Psychiatry

## 2012-04-23 VITALS — Wt 239.0 lb

## 2012-04-23 DIAGNOSIS — F4310 Post-traumatic stress disorder, unspecified: Secondary | ICD-10-CM

## 2012-04-23 DIAGNOSIS — F200 Paranoid schizophrenia: Secondary | ICD-10-CM

## 2012-04-23 MED ORDER — AMITRIPTYLINE HCL 50 MG PO TABS: ORAL_TABLET | ORAL | Status: DC

## 2012-04-23 MED ORDER — SERTRALINE HCL 100 MG PO TABS: 150.0000 mg | ORAL_TABLET | Freq: Every day | ORAL | Status: DC

## 2012-04-23 MED ORDER — HALOPERIDOL 5 MG PO TABS: 5.0000 mg | ORAL_TABLET | Freq: Two times a day (BID) | ORAL | Status: DC
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
## 2012-05-12 ENCOUNTER — Encounter (HOSPITAL_COMMUNITY): Payer: Self-pay

## 2012-05-12 ENCOUNTER — Ambulatory Visit (HOSPITAL_COMMUNITY): Payer: Self-pay | Admitting: Marriage and Family Therapist

## 2012-05-13 ENCOUNTER — Other Ambulatory Visit (HOSPITAL_COMMUNITY): Payer: Self-pay | Admitting: Psychiatry

## 2012-05-13 DIAGNOSIS — F200 Paranoid schizophrenia: Secondary | ICD-10-CM

## 2012-05-14 ENCOUNTER — Encounter (HOSPITAL_COMMUNITY): Payer: Self-pay

## 2012-06-04 ENCOUNTER — Encounter (HOSPITAL_COMMUNITY): Payer: Self-pay | Admitting: Psychiatry

## 2012-06-04 ENCOUNTER — Ambulatory Visit (INDEPENDENT_AMBULATORY_CARE_PROVIDER_SITE_OTHER): Payer: BC Managed Care – PPO | Admitting: Psychiatry

## 2012-06-04 VITALS — BP 112/79 | HR 85 | Wt 238.0 lb

## 2012-06-04 DIAGNOSIS — R51 Headache: Secondary | ICD-10-CM

## 2012-06-04 DIAGNOSIS — E039 Hypothyroidism, unspecified: Secondary | ICD-10-CM

## 2012-06-04 DIAGNOSIS — F200 Paranoid schizophrenia: Secondary | ICD-10-CM

## 2012-06-04 DIAGNOSIS — G43909 Migraine, unspecified, not intractable, without status migrainosus: Secondary | ICD-10-CM | POA: Insufficient documentation

## 2012-06-04 DIAGNOSIS — F4310 Post-traumatic stress disorder, unspecified: Secondary | ICD-10-CM

## 2012-06-04 DIAGNOSIS — R569 Unspecified convulsions: Secondary | ICD-10-CM | POA: Insufficient documentation

## 2012-06-04 MED ORDER — HALOPERIDOL 5 MG PO TABS: ORAL_TABLET | ORAL | Status: DC
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
## 2012-06-08 ENCOUNTER — Ambulatory Visit (INDEPENDENT_AMBULATORY_CARE_PROVIDER_SITE_OTHER): Payer: BC Managed Care – PPO | Admitting: Marriage and Family Therapist

## 2012-06-08 DIAGNOSIS — F332 Major depressive disorder, recurrent severe without psychotic features: Secondary | ICD-10-CM

## 2012-06-08 DIAGNOSIS — F3289 Other specified depressive episodes: Secondary | ICD-10-CM

## 2012-06-08 DIAGNOSIS — F329 Major depressive disorder, single episode, unspecified: Secondary | ICD-10-CM

## 2012-06-08 DIAGNOSIS — F200 Paranoid schizophrenia: Secondary | ICD-10-CM

## 2012-06-08 DIAGNOSIS — F4310 Post-traumatic stress disorder, unspecified: Secondary | ICD-10-CM
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
## 2012-07-03 ENCOUNTER — Encounter (HOSPITAL_COMMUNITY): Payer: Self-pay | Admitting: Psychiatry

## 2012-07-03 ENCOUNTER — Ambulatory Visit (INDEPENDENT_AMBULATORY_CARE_PROVIDER_SITE_OTHER): Payer: BC Managed Care – PPO | Admitting: Psychiatry

## 2012-07-03 DIAGNOSIS — F200 Paranoid schizophrenia: Secondary | ICD-10-CM

## 2012-07-03 MED ORDER — SERTRALINE HCL 100 MG PO TABS: 150.0000 mg | ORAL_TABLET | Freq: Every day | ORAL | Status: DC

## 2012-07-03 MED ORDER — BENZTROPINE MESYLATE 2 MG PO TABS: 2.0000 mg | ORAL_TABLET | Freq: Two times a day (BID) | ORAL | Status: DC

## 2012-07-03 MED ORDER — HALOPERIDOL 5 MG PO TABS: ORAL_TABLET | ORAL | Status: DC
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
## 2012-07-06 ENCOUNTER — Telehealth (HOSPITAL_COMMUNITY): Payer: Self-pay | Admitting: Psychiatry

## 2012-07-06 NOTE — Telephone Encounter (Signed)
Husband called and reported that increase Haldol is not helping the patient.  Patient remains paranoid and not sleeping very well.  In the past she was taking amitriptyline 100 mg for insomnia which was decreased to 50 mg as patient complained of dizziness.  I recommend to try amitriptyline 75 mg.  I will continue present dose of Haldol.  Patient is scheduled to see therapist on March 6.  If patient does not improve husband will call us back.  Explained side effects of amitriptyline especially sedation and drowsiness.

## 2012-07-09 ENCOUNTER — Ambulatory Visit (INDEPENDENT_AMBULATORY_CARE_PROVIDER_SITE_OTHER): Payer: BC Managed Care – PPO | Admitting: Marriage and Family Therapist

## 2012-07-09 ENCOUNTER — Emergency Department (HOSPITAL_COMMUNITY)
Admission: EM | Admit: 2012-07-09 | Discharge: 2012-07-11 | Disposition: A | Discharge status: Other Acute Inpatient Hospital | Payer: BC Managed Care – PPO | Attending: Emergency Medicine | Admitting: Emergency Medicine

## 2012-07-09 ENCOUNTER — Encounter (HOSPITAL_COMMUNITY): Payer: Self-pay | Admitting: *Deleted

## 2012-07-09 DIAGNOSIS — F4310 Post-traumatic stress disorder, unspecified: Secondary | ICD-10-CM

## 2012-07-09 DIAGNOSIS — Z8659 Personal history of other mental and behavioral disorders: Secondary | ICD-10-CM | POA: Insufficient documentation

## 2012-07-09 DIAGNOSIS — F329 Major depressive disorder, single episode, unspecified: Secondary | ICD-10-CM | POA: Insufficient documentation

## 2012-07-09 DIAGNOSIS — R441 Visual hallucinations: Secondary | ICD-10-CM

## 2012-07-09 DIAGNOSIS — H5316 Psychophysical visual disturbances: Secondary | ICD-10-CM | POA: Insufficient documentation

## 2012-07-09 DIAGNOSIS — G40909 Epilepsy, unspecified, not intractable, without status epilepticus: Secondary | ICD-10-CM | POA: Insufficient documentation

## 2012-07-09 DIAGNOSIS — Z79899 Other long term (current) drug therapy: Secondary | ICD-10-CM | POA: Insufficient documentation

## 2012-07-09 DIAGNOSIS — F3289 Other specified depressive episodes: Secondary | ICD-10-CM | POA: Insufficient documentation

## 2012-07-09 DIAGNOSIS — F200 Paranoid schizophrenia: Secondary | ICD-10-CM

## 2012-07-09 DIAGNOSIS — Z87891 Personal history of nicotine dependence: Secondary | ICD-10-CM | POA: Insufficient documentation

## 2012-07-09 DIAGNOSIS — Z862 Personal history of diseases of the blood and blood-forming organs and certain disorders involving the immune mechanism: Secondary | ICD-10-CM | POA: Insufficient documentation

## 2012-07-09 DIAGNOSIS — F29 Unspecified psychosis not due to a substance or known physiological condition: Secondary | ICD-10-CM

## 2012-07-09 DIAGNOSIS — R011 Cardiac murmur, unspecified: Secondary | ICD-10-CM | POA: Insufficient documentation

## 2012-07-09 DIAGNOSIS — E039 Hypothyroidism, unspecified: Secondary | ICD-10-CM | POA: Insufficient documentation

## 2012-07-09 HISTORY — DX: Schizophrenia, unspecified: F20.9

## 2012-07-09 LAB — RAPID URINE DRUG SCREEN, HOSP PERFORMED
Barbiturates: NOT DETECTED
Benzodiazepines: NOT DETECTED
Tetrahydrocannabinol: NOT DETECTED

## 2012-07-09 LAB — CBC
Hemoglobin: 12.3 g/dL (ref 12.0–15.0)
RBC: 3.86 MIL/uL — ABNORMAL LOW (ref 3.87–5.11)
WBC: 5.9 10*3/uL (ref 4.0–10.5)

## 2012-07-09 LAB — COMPREHENSIVE METABOLIC PANEL
ALT: 23 U/L (ref 0–35)
Alkaline Phosphatase: 65 U/L (ref 39–117)
CO2: 23 mEq/L (ref 19–32)
GFR calc Af Amer: >90 mL/min (ref >90)
GFR calc non Af Amer: >90 mL/min (ref >90)
Glucose, Bld: 112 mg/dL — ABNORMAL HIGH (ref 70–99)
Potassium: 4.5 mEq/L (ref 3.5–5.1)
Sodium: 134 mEq/L — ABNORMAL LOW (ref 135–145)

## 2012-07-09 MED ORDER — LEVOTHYROXINE SODIUM 88 MCG PO TABS
88.0000 ug | ORAL_TABLET | Freq: Every day | ORAL | Status: DC
  Administered 2012-07-10 – 2012-07-11 (×2): 88 ug via ORAL
  Filled 2012-07-09 (×5): qty 1

## 2012-07-09 MED ORDER — LAMOTRIGINE 150 MG PO TABS
150.0000 mg | ORAL_TABLET | Freq: Every day | ORAL | Status: DC
  Administered 2012-07-09 – 2012-07-10 (×2): 150 mg via ORAL
  Filled 2012-07-09 (×3): qty 1

## 2012-07-09 MED ORDER — ZOLPIDEM TARTRATE 5 MG PO TABS
5.0000 mg | ORAL_TABLET | Freq: Every evening | ORAL | Status: DC | PRN
  Administered 2012-07-10: 5 mg via ORAL
  Filled 2012-07-09: qty 1

## 2012-07-09 MED ORDER — ALUM & MAG HYDROXIDE-SIMETH 200-200-20 MG/5ML PO SUSP: 30.0000 mL | ORAL | Status: DC | PRN

## 2012-07-09 MED ORDER — HALOPERIDOL 5 MG PO TABS
5.0000 mg | ORAL_TABLET | Freq: Two times a day (BID) | ORAL | Status: DC
  Administered 2012-07-09 – 2012-07-11 (×4): 5 mg via ORAL
  Filled 2012-07-09 (×4): qty 1

## 2012-07-09 MED ORDER — LORAZEPAM 1 MG PO TABS
1.0000 mg | ORAL_TABLET | Freq: Three times a day (TID) | ORAL | Status: DC | PRN
  Administered 2012-07-10 (×2): 1 mg via ORAL
  Filled 2012-07-09 (×2): qty 1

## 2012-07-09 MED ORDER — ONDANSETRON HCL 4 MG PO TABS: 4.0000 mg | ORAL_TABLET | Freq: Three times a day (TID) | ORAL | Status: DC | PRN

## 2012-07-09 MED ORDER — AMITRIPTYLINE HCL 25 MG PO TABS
75.0000 mg | ORAL_TABLET | Freq: Every day | ORAL | Status: DC
  Administered 2012-07-09 – 2012-07-10 (×2): 75 mg via ORAL
  Filled 2012-07-09 (×2): qty 3

## 2012-07-09 MED ORDER — NICOTINE 21 MG/24HR TD PT24
21.0000 mg | MEDICATED_PATCH | Freq: Every day | TRANSDERMAL | Status: DC
  Filled 2012-07-09: qty 1

## 2012-07-09 MED ORDER — IBUPROFEN 600 MG PO TABS: 600.0000 mg | ORAL_TABLET | Freq: Three times a day (TID) | ORAL | Status: DC | PRN

## 2012-07-09 MED ORDER — DIVALPROEX SODIUM ER 500 MG PO TB24
1000.0000 mg | ORAL_TABLET | ORAL | Status: DC
  Administered 2012-07-09 – 2012-07-11 (×4): 1000 mg via ORAL
  Filled 2012-07-09 (×6): qty 2

## 2012-07-09 MED ORDER — ACETAMINOPHEN 325 MG PO TABS: 650.0000 mg | ORAL_TABLET | ORAL | Status: DC | PRN

## 2012-07-09 MED ORDER — SERTRALINE HCL 50 MG PO TABS
150.0000 mg | ORAL_TABLET | Freq: Every day | ORAL | Status: DC
  Administered 2012-07-10 – 2012-07-11 (×2): 150 mg via ORAL
  Filled 2012-07-09 (×2): qty 3

## 2012-07-09 MED ORDER — BENZTROPINE MESYLATE 1 MG PO TABS
2.0000 mg | ORAL_TABLET | Freq: Two times a day (BID) | ORAL | Status: DC
  Administered 2012-07-09 – 2012-07-11 (×4): 2 mg via ORAL
  Filled 2012-07-09 (×4): qty 2

## 2012-07-09 MED ORDER — MODAFINIL 200 MG PO TABS
100.0000 mg | ORAL_TABLET | Freq: Every morning | ORAL | Status: DC
  Administered 2012-07-10 – 2012-07-11 (×2): 100 mg via ORAL
  Filled 2012-07-09 (×2): qty 1
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
## 2012-07-09 NOTE — ED Provider Notes (Signed)
History     CSN: [id number]  Arrival date & time 07/09/12  4098   First MD Initiated Contact with Patient 07/09/12 1034      Chief Complaint  Patient presents with  . Medical Clearance    (Consider location/radiation/quality/duration/timing/severity/associated sxs/prior treatment) HPI  Danielle [name] is a 42 y.o. female with past medical history significant for seizure disorder, bipolar presenting today with visual hallucinations. She states she's had these before in the past they come and go. She describes them as squiggly as that was per she cannot make out what they're saying that she feels that they are threatening. She states that she doesn't feel as if she or her husband is safe. Patient does have vague suicidal ideations with no plan. She does have a prior suicide attempt she cut her wrists. She denies homicidal ideation patient follows a behavioral health. She denies chest pain, shortness of breath, abdominal pain, nausea vomiting, change in bowel or bladder habits, dysarthria, ataxia, weakness, paresthesias. He should and has been compliant with her medications as per patient and husband.  Past Medical History  Diagnosis Date  . Depressed   . Seizures   . Heart murmur   . Hypothyroidism   . Anemia   . Headache   . Anxiety   . PTSD (post-traumatic stress disorder)   . Shortness of breath     Past Surgical History  Procedure Laterality Date  . Tubal ligation  1996    Family History  Problem Relation Age of Onset  . Adopted: Yes    History  Substance Use Topics  . Smoking status: Former Smoker -- 1.50 packs/day for 30 years    Types: Cigarettes    Quit date: 09/20/2011  . Smokeless tobacco: Never Used  . Alcohol Use: No    OB History   Grav Para Term Preterm Abortions TAB SAB Ect Mult Living                  Review of Systems  Constitutional: Negative for fever.  Respiratory: Negative for shortness of breath.   Cardiovascular: Negative for chest pain.   Gastrointestinal: Negative for nausea, vomiting, abdominal pain and diarrhea.  Psychiatric/Behavioral: Positive for suicidal ideas and hallucinations.  All other systems reviewed and are negative.    Allergies  Tomato; Imitrex; and Mustard seed  Home Medications   Current Outpatient Rx  Name  Route  Sig  Dispense  Refill  . amitriptyline (ELAVIL) 50 MG tablet   Oral   Take 75 mg by mouth at bedtime.          . benztropine (COGENTIN) 2 MG tablet   Oral   Take 1 tablet (2 mg total) by mouth 2 (two) times daily.   60 tablet   0   . divalproex (DEPAKOTE ER) 500 MG 24 hr tablet   Oral   Take 2 tablets (1,000 mg total) by mouth 2 (two) times daily in the am and at bedtime.. For mood stability and seizure prevention.   120 tablet   0   . haloperidol (HALDOL) 5 MG tablet      Take 1 in am and 3 at hs   120 tablet   0   . lamoTRIgine (LAMICTAL) 150 MG tablet   Oral   Take 1 tablet (150 mg total) by mouth at bedtime. For mood stability   30 tablet   0   . levothyroxine (SYNTHROID, LEVOTHROID) 88 MCG tablet   Oral   Take 88  mcg by mouth daily.         . modafinil (PROVIGIL) 200 MG tablet   Oral   Take 0.5 tablets (100 mg total) by mouth every morning. For daytime sleepiness.   15 tablet   0   . sertraline (ZOLOFT) 100 MG tablet   Oral   Take 1.5 tablets (150 mg total) by mouth daily. For depression and anxiety.   45 tablet   0     BP 113/84  Pulse 93  Temp(Src) 98.2 F (36.8 C) (Oral)  Resp 14  SpO2 94%  Physical Exam  Nursing note and vitals reviewed. Constitutional: She is oriented to person, place, and time. She appears well-developed and well-nourished. No distress.  HENT:  Head: Normocephalic.  Eyes: Conjunctivae and EOM are normal. Pupils are equal, round, and reactive to light.  Neck: Normal range of motion.  Cardiovascular: Normal rate.   Pulmonary/Chest: Effort normal and breath sounds normal. No stridor. No respiratory distress. She has  no wheezes. She has no rales. She exhibits no tenderness.  Abdominal: Soft. She exhibits no distension and no mass. There is no tenderness. There is no rebound and no guarding.  Musculoskeletal: Normal range of motion.  Neurological: She is alert and oriented to person, place, and time.  Psychiatric: She has a normal mood and affect. Her speech is not rapid and/or pressured. She is slowed. She is not withdrawn. She does not express impulsivity or inappropriate judgment. She expresses homicidal and suicidal ideation. She expresses homicidal plans. She expresses no suicidal plans. She is communicative. She is attentive.    ED Course  Procedures (including critical care time)  Labs Reviewed  CBC - Abnormal; Notable for the following:    RBC 3.86 (*)    All other components within normal limits  COMPREHENSIVE METABOLIC PANEL - Abnormal; Notable for the following:    Sodium 134 (*)    Glucose, Bld 112 (*)    Total Bilirubin 0.2 (*)    All other components within normal limits  ETHANOL  URINE RAPID DRUG SCREEN (HOSP PERFORMED)   No results found.   1. Hallucination, visual       MDM   Danielle [name] is a 42 y.o. female  extensive psychiatric history complaining of vague suicidal ideation and visual hallucinations. Been compliant with her medication.    Filed Vitals:   07/09/12 0959  BP: 113/84  Pulse: 93  Temp: 98.2 F (36.8 C)  TempSrc: Oral  Resp: 14  SpO2: 94%     This is a shared visit with the attending physician who personally evaluated the patient and agrees with the care plan.   Physical exam and lab work are unremarkable. Patient is medically cleared for psychiatric evaluation ACT team consulted, holding orders placed and patient's is home meds are ordered.  Wynetta Emery, PA-C 07/09/12 1154

## 2012-07-09 NOTE — ED Notes (Signed)
Pt presents requesting medical clearance from [facility]. Per husband, pt is accepted pending clearance. Pt reports [facility], "seeing things flying around, they make noise, they are going to get me and they are going to get him" referring to husband. Pt sts she does not feel safe and is SI. Denies HI. Asking "am I safe?" Denies plan to harm self.

## 2012-07-09 NOTE — Consult Note (Signed)
Reason for Consult: Auditory visual hallucinations and anxiety symptoms and suicidal thoughts Referring Physician: Dr. Dessie Coma is an 42 y.o. female.  HPI: A she was seen and chart reviewed. Patient was referred to the [facility] long emergency department from the primary psychiatrist at this for the evaluation of depression, auditory visual and comment auditory hallucinations and uncontrollable severe anxiety. Patient is a poor historian does not remember the names of the psychiatrist and therapist who sent her to the hospital. Patient was previously treated by the primary care physician's. Patient cannot contract for safety and requesting patient hospitalization and her family supportive of her.  MSE: Patient stated mood was extremely anxious and sad her affect was dysphoric since the low psychomotor activity and nerves thought processes and depressed and anxious I need help. Patient also has suicidal thoughts without intentions or plans he has no onset ideation intents or plans has no evidence of psychotic symptoms.  Past Medical History  Diagnosis Date  . Depressed   . Seizures   . Heart murmur   . Hypothyroidism   . Anemia   . Headache   . Anxiety   . PTSD (post-traumatic stress disorder)   . Shortness of breath   . Schizophrenia     Past Surgical History  Procedure Laterality Date  . Tubal ligation  1996    Family History  Problem Relation Age of Onset  . Adopted: Yes    Social History:  reports that she quit smoking about 9 months ago. Her smoking use included Cigarettes. She has a 45 pack-year smoking history. She has never used smokeless tobacco. She reports that she does not drink alcohol or use illicit drugs.  Allergies:  Allergies  Allergen Reactions  . Tomato Rash    Pt reports that she breaks out in a rash if she eats tomato based foods but can eat a whole tomato  . Imitrex (Sumatriptan Base) Hives  . Mustard Seed Rash    Also allergic to Mayo     Medications: I have reviewed the patient's current medications.  Results for orders placed during the hospital encounter of 07/09/12 (from the past 48 hour(s))  URINE RAPID DRUG SCREEN (HOSP PERFORMED)     Status: None   Collection Time    07/09/12 10:15 AM      Result Value Range   Opiates NONE DETECTED  NONE DETECTED   Cocaine NONE DETECTED  NONE DETECTED   Benzodiazepines NONE DETECTED  NONE DETECTED   Amphetamines NONE DETECTED  NONE DETECTED   Tetrahydrocannabinol NONE DETECTED  NONE DETECTED   Barbiturates NONE DETECTED  NONE DETECTED   Comment:            DRUG SCREEN FOR MEDICAL PURPOSES     ONLY.  IF CONFIRMATION IS NEEDED     FOR ANY PURPOSE, NOTIFY LAB     WITHIN 5 DAYS.                LOWEST DETECTABLE LIMITS     FOR URINE DRUG SCREEN     Drug Class       Cutoff (ng/mL)     Amphetamine      1000     Barbiturate      200     Benzodiazepine   200     Tricyclics       300     Opiates          300     Cocaine  300     THC              50  CBC     Status: Abnormal   Collection Time    07/09/12 10:39 AM      Result Value Range   WBC 5.9  4.0 - 10.5 K/uL   RBC 3.86 (*) 3.87 - 5.11 MIL/uL   Hemoglobin 12.3  12.0 - 15.0 g/dL   HCT 86.5  78.4 - 69.6 %   MCV 95.9  78.0 - 100.0 fL   MCH 31.9  26.0 - 34.0 pg   MCHC 33.2  30.0 - 36.0 g/dL   RDW 29.5  28.4 - 13.2 %   Platelets 209  150 - 400 K/uL  COMPREHENSIVE METABOLIC PANEL     Status: Abnormal   Collection Time    07/09/12 10:39 AM      Result Value Range   Sodium 134 (*) 135 - 145 mEq/L   Potassium 4.5  3.5 - 5.1 mEq/L   Chloride 98  96 - 112 mEq/L   CO2 23  19 - 32 mEq/L   Glucose, Bld 112 (*) 70 - 99 mg/dL   BUN 17  6 - 23 mg/dL   Creatinine, Ser 4.40  0.50 - 1.10 mg/dL   Calcium 9.5  8.4 - 10.2 mg/dL   Total Protein 7.2  6.0 - 8.3 g/dL   Albumin 3.6  3.5 - 5.2 g/dL   AST 29  0 - 37 U/L   ALT 23  0 - 35 U/L   Alkaline Phosphatase 65  39 - 117 U/L   Total Bilirubin 0.2 (*) 0.3 - 1.2 mg/dL    GFR calc non Af Amer >90  >90 mL/min   GFR calc Af Amer >90  >90 mL/min   Comment:            The eGFR has been calculated     using the CKD EPI equation.     This calculation has not been     validated in all clinical     situations.     eGFR's persistently     <90 mL/min signify     possible Chronic Kidney Disease.  ETHANOL     Status: None   Collection Time    07/09/12 10:39 AM      Result Value Range   Alcohol, Ethyl (B) <11  0 - 11 mg/dL   Comment:            LOWEST DETECTABLE LIMIT FOR     SERUM ALCOHOL IS 11 mg/dL     FOR MEDICAL PURPOSES ONLY    No results found.  Positive for anxiety, bad mood, depression, learning difficulty, mood swings and sleep disturbance Blood pressure 113/84, pulse 93, temperature 98.2 F (36.8 C), temperature source Oral, resp. rate 14, SpO2 94.00%.   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  JONNALAGADDA,JANARDHAHA R. 07/09/2012, 4:44 PM

## 2012-07-09 NOTE — ED Provider Notes (Signed)
Patient reports having auditory and visual hallucinations that started a couple weeks ago. She was seen by her psychiatrist about a week ago and had her Haldol increased to 3 pills at bedtime and once in the morning. They state she was not getting relief and a couple days ago they increased her amitriptyline to 75 mg a day from 50 mg a day. Husband reports when she was on 100 mg a day she was too sleepy. Patient states that she hears voices and they are whispering voices. She states it's usually her mother's voice. She states the voices telling her to hurt her self. She reports as a child her brother could do no wrong and the patient could do no right as far as her mother was concerned. Her husband reports her maid of honor died recently and that seemed to have recurred this last episode.  Pt is alert, cooperative, seems upset.  Medical screening examination/treatment/procedure(s) were conducted as a shared visit with non-physician practitioner(s) and myself.  I personally evaluated the patient during the encounter  Devoria Albe, MD, Franz Dell, MD 07/09/12 1140

## 2012-07-09 NOTE — ED Notes (Signed)
Pt has been wanded and belongings searched by security.

## 2012-07-09 NOTE — ED Provider Notes (Signed)
See prior note    Givens, MD 07/09/12 1200
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
## 2012-07-10 NOTE — ED Provider Notes (Signed)
Pt sleeping; RNs report no concerns overnight.   Hurman Horn, MD 07/11/12 1344

## 2012-07-10 NOTE — ED Notes (Signed)
Given snack, peanut butter and crackers

## 2012-07-10 NOTE — ED Notes (Signed)
Feeling somewhat better this afternoon than this morning, husband here to visit, things not flying around at thistime, denies Sior Hi, affect flat, patient is pleasant and cooperative.

## 2012-07-10 NOTE — ED Notes (Signed)
Check patient belonging book

## 2012-07-10 NOTE — BH Assessment (Signed)
[facility] Assessment Progress Note      Consulted Shuvon Rankin, NP for client to be transferred here to [facility]. Accepted to the 400 hall when a bed becomes available.No 400 hall discharges expected today.

## 2012-07-11 NOTE — ED Notes (Signed)
[facility] here to transport to [facility]. Ambulatory, denies pain. Did not have any belongings.

## 2012-07-11 NOTE — ED Notes (Signed)
Pt assisted to use the bathroom. She voided x 1. Vwilliams, rn.

## 2012-07-11 NOTE — ED Notes (Signed)
Report called to Evelyn,RN at [facility]. Accepting Dr.Reddy. [facility] notified of transportation need. Chart copied and will be sent with patient.

## 2012-07-11 NOTE — ED Notes (Signed)
Received report on pt. Pt in room resting comfortably in bed with eyes opened and watching television.  resp even and unlabored.

## 2012-07-11 NOTE — ED Notes (Signed)
Called to check that she is still on list for transportation to [facility]. Is 3rd on their list at this time.

## 2012-07-11 NOTE — ED Provider Notes (Signed)
BP 95/66  Pulse 87  Temp(Src) 97.6 F (36.4 C) (Oral)  Resp 18  SpO2 99% Pt resting well. No issues overnight. Pending [facility] bed.   Loren Racer, MD 07/11/12 [phone]

## 2012-07-14 ENCOUNTER — Telehealth (HOSPITAL_COMMUNITY): Payer: Self-pay | Admitting: Marriage and Family Therapist

## 2012-07-14 NOTE — Telephone Encounter (Signed)
Let Dr. Lolly Mustache know what patient is being treated.  Cleophas Dunker, LMFT, CTS

## 2012-07-16 ENCOUNTER — Ambulatory Visit (HOSPITAL_COMMUNITY): Payer: Self-pay | Admitting: Marriage and Family Therapist

## 2012-07-23 ENCOUNTER — Ambulatory Visit (INDEPENDENT_AMBULATORY_CARE_PROVIDER_SITE_OTHER): Payer: BC Managed Care – PPO | Admitting: Marriage and Family Therapist

## 2012-07-23 DIAGNOSIS — F4310 Post-traumatic stress disorder, unspecified: Secondary | ICD-10-CM

## 2012-07-23 DIAGNOSIS — F200 Paranoid schizophrenia: Secondary | ICD-10-CM

## 2012-07-23 DIAGNOSIS — F339 Major depressive disorder, recurrent, unspecified: Secondary | ICD-10-CM
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
## 2012-07-30 ENCOUNTER — Ambulatory Visit (HOSPITAL_COMMUNITY): Payer: Self-pay | Admitting: Marriage and Family Therapist

## 2012-07-31 ENCOUNTER — Ambulatory Visit (INDEPENDENT_AMBULATORY_CARE_PROVIDER_SITE_OTHER): Payer: BC Managed Care – PPO | Admitting: Psychiatry

## 2012-07-31 ENCOUNTER — Encounter (HOSPITAL_COMMUNITY): Payer: Self-pay | Admitting: Psychiatry

## 2012-07-31 VITALS — BP 97/70 | HR 80 | Wt 236.0 lb

## 2012-07-31 DIAGNOSIS — F200 Paranoid schizophrenia: Secondary | ICD-10-CM

## 2012-07-31 MED ORDER — LAMOTRIGINE 200 MG PO TABS: 200.0000 mg | ORAL_TABLET | Freq: Every day | ORAL | Status: DC

## 2012-07-31 MED ORDER — BENZTROPINE MESYLATE 2 MG PO TABS: 2.0000 mg | ORAL_TABLET | Freq: Two times a day (BID) | ORAL | Status: DC

## 2012-07-31 MED ORDER — HALOPERIDOL 5 MG PO TABS: ORAL_TABLET | ORAL | Status: DC

## 2012-07-31 MED ORDER — SERTRALINE HCL 100 MG PO TABS: 150.0000 mg | ORAL_TABLET | Freq: Every day | ORAL | Status: DC

## 2012-07-31 MED ORDER — AMITRIPTYLINE HCL 100 MG PO TABS: 100.0000 mg | ORAL_TABLET | Freq: Every day | ORAL | Status: DC
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
## 2012-08-06 ENCOUNTER — Ambulatory Visit (INDEPENDENT_AMBULATORY_CARE_PROVIDER_SITE_OTHER): Payer: BC Managed Care – PPO | Admitting: Marriage and Family Therapist

## 2012-08-06 DIAGNOSIS — F329 Major depressive disorder, single episode, unspecified: Secondary | ICD-10-CM

## 2012-08-06 DIAGNOSIS — F4310 Post-traumatic stress disorder, unspecified: Secondary | ICD-10-CM

## 2012-08-06 DIAGNOSIS — F339 Major depressive disorder, recurrent, unspecified: Secondary | ICD-10-CM

## 2012-08-06 DIAGNOSIS — F3289 Other specified depressive episodes: Secondary | ICD-10-CM

## 2012-08-06 NOTE — Progress Notes (Signed)
   THERAPIST PROGRESS NOTE  Session Time: 9:00 - 10:00 a.m.  Participation Level: Active  Behavioral Response: DisheveledAlertAnxious  Type of Therapy: Individual Therapy  Treatment Goals addressed: Coping  Interventions: Strength-based and Supportive  Summary: Danielle [name] is a 42 y.o. female who presents with schizophrenia, depression, and PTSD.  She was referred by Dr. Lolly Mustache. Patient was present with her husband, [name].  Husband states that patient seems to be more improved based on an increase of 100 mg of amitriptyline. Patient states she has been feeling better because her husband has not been working.  She states she likes when he is home.  She states her daughter and three grandchildren will be moving in because she "can't say no."  She says they are loud and mess up the house including writing on her walls.  Husband states if they do come they usually last around two months until he tells them to get out.     Suicidal/Homicidal: No  Therapist Response:   Patient was unbalanced on her feet and reports falling.  Patient did appear more alert this session.  Patient finally was able to complete her treatment plan and signed it.  Gave her a copy of the treatment plan.  Also reviewed that soothed patient and/or what were things that made her feel better.  Patient did say her husband, her son helps her, taking a bath or shower when she cannot sleep, and playing with her grandchildren although she "has no patience."  She also states she likes staying in her room and watching the [employer] channel.  She states she does not like going outside even on her porch for any length of time.  Homework is for her and her husband to list ways patient self-soothes.  She will also spend five minutes on the porch with her husband.  Plan: Return again in 2 weeks.  Diagnosis: Axis I: Depression, Schizophrenia; PTSD    Axis II: Deferred    Elmer Merwin, LMFT 08/06/2012

## 2012-08-13 ENCOUNTER — Ambulatory Visit (HOSPITAL_COMMUNITY): Payer: Self-pay | Admitting: Marriage and Family Therapist

## 2012-08-20 ENCOUNTER — Ambulatory Visit (INDEPENDENT_AMBULATORY_CARE_PROVIDER_SITE_OTHER): Payer: BC Managed Care – PPO | Admitting: Marriage and Family Therapist

## 2012-08-20 DIAGNOSIS — F329 Major depressive disorder, single episode, unspecified: Secondary | ICD-10-CM

## 2012-08-20 DIAGNOSIS — F3289 Other specified depressive episodes: Secondary | ICD-10-CM

## 2012-08-20 DIAGNOSIS — F4310 Post-traumatic stress disorder, unspecified: Secondary | ICD-10-CM

## 2012-08-20 DIAGNOSIS — F200 Paranoid schizophrenia: Secondary | ICD-10-CM

## 2012-08-20 NOTE — Progress Notes (Signed)
   THERAPIST PROGRESS NOTE  Session Time: 9:00 - 10:00 a.m.  Participation Level: Minimal  Behavioral Response: DisheveledConfusedDepressed and Overwhelmed  Type of Therapy: Individual Therapy  Treatment Goals addressed: Coping  Interventions: Strength-based and Supportive  Summary: Danielle [name] is a 42 y.o. female who presents with schizophrenia, PTSD, depression.  She was referred by Dr. Lolly Mustache.  Patient was almost 15 minutes late for her appointment.  Her daughter brought her in because her husband "is sleeping all the time."  She reports having several people in her home, including a couple who are friends.  She reports this couple is very helpful and not a problem.  Her daughter has moved in with husband and three children.  Patient states that they are living in the basement and the walls are thin.  She reports there is "yelling and arguing" at the children and with the couple.  Patient states she is very overwhelmed and her husband has not been available because he is feeling overwhelmed and is coping by sleeping.  Patient did say she had a sleep study and is going to start using a CPAP and the doctor also wants to check her oxygen levels.  Suicidal/Homicidal: No  Therapist Response:  Patient's appearance was very disheveled with food stains all over her shirt, hair not combed, and her shoe laces were untied.  She appeared drowsy, was yawning and talking with eyes closed, and stated she was very tired.  Her memory is poor, based on not remembering anything from the last session including homework assignments.  She also had difficulty with answering questions like, "how big is your house?"  She asked at least four times to have her daughter back in the session, not for a session, but because she was concerned that her daughter was angry with her for having to take her to the session.  She admitted she feels guilty for "not pleasing daughter and everyone else."  This Clinical research associate told patient her  daughter could not attend the session based on her reasoning and made an attempt to help her focus on herself with little success.  Discussed patient's mental health being a problem with daughter and her family now living with them (patient admits she is starting to hallucinate and feels "very overwhelmed."   Suggested that she and her husband discuss a limit on how long her daughter and family could stay since they were causing problems with patient's mental health.  Plan: Return again in 2 weeks.  Diagnosis: Axis I: Paranoid Schizophrenia; Depressive d/o NOS; PTSD    Axis II: Deferred    Travis Purk, LMFT, CTS 08/20/2012

## 2012-08-26 ENCOUNTER — Telehealth (HOSPITAL_COMMUNITY): Payer: Self-pay

## 2012-08-26 NOTE — Telephone Encounter (Signed)
10:22am 08/27/12 Called and left msg due to provider Dorina Hoyer has an open slot on May 1st/sh

## 2012-08-27 ENCOUNTER — Ambulatory Visit (HOSPITAL_COMMUNITY): Payer: Self-pay | Admitting: Marriage and Family Therapist

## 2012-09-01 ENCOUNTER — Other Ambulatory Visit (HOSPITAL_COMMUNITY): Payer: Self-pay | Admitting: Psychiatry

## 2012-09-01 NOTE — Telephone Encounter (Signed)
All psychiatric meds are given with one extra refill on 07/31/12.

## 2012-09-11 ENCOUNTER — Encounter (HOSPITAL_COMMUNITY): Payer: Self-pay | Admitting: Psychiatry

## 2012-09-11 ENCOUNTER — Ambulatory Visit (INDEPENDENT_AMBULATORY_CARE_PROVIDER_SITE_OTHER): Payer: BC Managed Care – PPO | Admitting: Psychiatry

## 2012-09-11 DIAGNOSIS — F200 Paranoid schizophrenia: Secondary | ICD-10-CM

## 2012-09-11 MED ORDER — HALOPERIDOL 5 MG PO TABS: ORAL_TABLET | ORAL | Status: DC

## 2012-09-11 MED ORDER — SERTRALINE HCL 100 MG PO TABS: 150.0000 mg | ORAL_TABLET | Freq: Every day | ORAL | Status: DC

## 2012-09-11 MED ORDER — AMITRIPTYLINE HCL 100 MG PO TABS: 100.0000 mg | ORAL_TABLET | Freq: Every day | ORAL | Status: DC

## 2012-09-11 MED ORDER — LAMOTRIGINE 200 MG PO TABS: 200.0000 mg | ORAL_TABLET | Freq: Every day | ORAL | Status: DC

## 2012-09-11 MED ORDER — BENZTROPINE MESYLATE 2 MG PO TABS: 2.0000 mg | ORAL_TABLET | Freq: Two times a day (BID) | ORAL | Status: DC
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
## 2012-09-15 ENCOUNTER — Ambulatory Visit (INDEPENDENT_AMBULATORY_CARE_PROVIDER_SITE_OTHER): Payer: BC Managed Care – PPO | Admitting: Marriage and Family Therapist

## 2012-09-15 DIAGNOSIS — F329 Major depressive disorder, single episode, unspecified: Secondary | ICD-10-CM

## 2012-09-15 DIAGNOSIS — F4310 Post-traumatic stress disorder, unspecified: Secondary | ICD-10-CM

## 2012-09-15 DIAGNOSIS — F200 Paranoid schizophrenia: Secondary | ICD-10-CM
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
## 2012-10-13 ENCOUNTER — Ambulatory Visit (INDEPENDENT_AMBULATORY_CARE_PROVIDER_SITE_OTHER): Payer: BC Managed Care – PPO | Admitting: Marriage and Family Therapist

## 2012-10-13 DIAGNOSIS — F3289 Other specified depressive episodes: Secondary | ICD-10-CM

## 2012-10-13 DIAGNOSIS — F4310 Post-traumatic stress disorder, unspecified: Secondary | ICD-10-CM

## 2012-10-13 DIAGNOSIS — F329 Major depressive disorder, single episode, unspecified: Secondary | ICD-10-CM

## 2012-10-13 DIAGNOSIS — F200 Paranoid schizophrenia: Secondary | ICD-10-CM

## 2012-10-13 NOTE — Progress Notes (Signed)
   THERAPIST PROGRESS NOTE  Session Time:  2:00 - 3:00 a.m.  Participation Level: Active  Behavioral Response: DisheveledDrowsyAnxious and Depressed  Type of Therapy: Individual Therapy  Treatment Goals addressed: Coping  Interventions: Strength-based and Supportive  Summary: Danielle [name] is a 42 y.o. female who presents with schizophrenia, depression, and PTSD.  She was referred by Dr. Lolly Mustache.  Patient reported some type of a situation around who was living in the house and who left, possibly the couple that were staying in her house.  Her daughter is still living there but husband gave until the end of the month for them to move out.  Patient did say she was going to [facility] with her husband but was could not say if anyone else was going with them.  Patient had a cast on her leg stating it would be coming off in two weeks.  She reports having a cast has been very limiting for her.  Suicidal/Homicidal: Nowithout intent/plan  Therapist Response:   Patient was 15 minutes late for the session and requested leaving 10 minutes before the session was supposed to end.  She yawned throughout the session, stating she was very tired.  She was slurring her words and was difficult to understand.  She could not say what she wanted from sessions when asked except to say that she wanted to come.   It was very difficult to assess what was going on regarding stressors due to patient's demeanor.  Discussed the idea of patient going on disability since patient states she is not on disability.  Asked her to have her husband in for that conversation.    Plan: Return again in 2 weeks.  Diagnosis: Axis I: Schizophrenia, paranoid type; Depression NOS; PTSD    Axis II: Deferred    Assyria Morreale, LMFT, CTS 10/13/2012

## 2012-10-27 ENCOUNTER — Ambulatory Visit (INDEPENDENT_AMBULATORY_CARE_PROVIDER_SITE_OTHER): Payer: BC Managed Care – PPO | Admitting: Marriage and Family Therapist

## 2012-10-27 DIAGNOSIS — F329 Major depressive disorder, single episode, unspecified: Secondary | ICD-10-CM

## 2012-10-27 DIAGNOSIS — F3289 Other specified depressive episodes: Secondary | ICD-10-CM

## 2012-10-27 DIAGNOSIS — F4310 Post-traumatic stress disorder, unspecified: Secondary | ICD-10-CM

## 2012-10-27 DIAGNOSIS — F200 Paranoid schizophrenia: Secondary | ICD-10-CM

## 2012-10-27 NOTE — Progress Notes (Signed)
   THERAPIST PROGRESS NOTE  Session Time:  9:00 - 10:00 a.m.  Participation Level: Minimal  Behavioral Response: DisheveledConfused and DrowsyDepressed  Type of Therapy: Individual Therapy  Treatment Goals addressed: Coping  Interventions: Strength-based and Supportive  Summary: Danielle [name] is a 42 y.o. female who presents with schizophrenia, depression, and PTSD.  She was referred by Dr. Lolly Mustache.  Patient came in a wheelchair due to her broken foot.  Her husband accompanied her into the session.  Husband mainly spoke for the patient.  He did say the stressors in the house in part have been eliminated with two house guests leaving and soon her daughter and her family will be leaving.  Patient did say she "likes her daughter sometimes and sometimes she does not like her."  Husband reports in a week they will be going to [facility] camping, patient, husband, and 48 y/o son.    Suicidal/Homicidal: Unclear, could not assess  Therapist Response:   Patient's demeanor was lethargic and depressed.  Asked patient what she was experiencing and she stated, "I don't know."  She spent about 30 minutes in the session and then asked to use the bathroom.  She hesitated when asked questions at times not answering. Husband finally stated she did not take her medication before there session as the reason she was acting this way.  Tried to discuss how she felt about going camping and again stated, "I don't know."  No work was completed in this session with the exception of this Clinical research associate being supportive.  Plan: Return again in 2 weeks.  Diagnosis: Axis I: Schizophrenia, paranoid type; Depression NOS; PTSD    Axis II: Deferred    Naviyah Schaffert, LMFT, CTS 10/27/2012

## 2012-11-10 ENCOUNTER — Ambulatory Visit (HOSPITAL_COMMUNITY): Payer: Self-pay | Admitting: Marriage and Family Therapist

## 2012-11-12 ENCOUNTER — Ambulatory Visit (HOSPITAL_COMMUNITY): Payer: Self-pay | Admitting: Psychiatry

## 2012-11-13 ENCOUNTER — Encounter (HOSPITAL_COMMUNITY): Payer: Self-pay | Admitting: Psychiatry

## 2012-11-13 ENCOUNTER — Ambulatory Visit (INDEPENDENT_AMBULATORY_CARE_PROVIDER_SITE_OTHER): Payer: BC Managed Care – PPO | Admitting: Psychiatry

## 2012-11-13 VITALS — BP 116/74 | HR 107 | Wt 227.0 lb

## 2012-11-13 DIAGNOSIS — F200 Paranoid schizophrenia: Secondary | ICD-10-CM

## 2012-11-13 DIAGNOSIS — F4310 Post-traumatic stress disorder, unspecified: Secondary | ICD-10-CM

## 2012-11-13 MED ORDER — HALOPERIDOL 5 MG PO TABS: ORAL_TABLET | ORAL | Status: DC

## 2012-11-13 MED ORDER — AMITRIPTYLINE HCL 100 MG PO TABS: 100.0000 mg | ORAL_TABLET | Freq: Every day | ORAL | Status: DC

## 2012-11-13 MED ORDER — SERTRALINE HCL 100 MG PO TABS: ORAL_TABLET | ORAL | Status: DC

## 2012-11-13 MED ORDER — BENZTROPINE MESYLATE 1 MG PO TABS: 1.0000 mg | ORAL_TABLET | Freq: Two times a day (BID) | ORAL | Status: DC

## 2012-11-13 MED ORDER — LAMOTRIGINE 200 MG PO TABS: 200.0000 mg | ORAL_TABLET | Freq: Every day | ORAL | Status: DC
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
## 2012-11-23 ENCOUNTER — Ambulatory Visit (HOSPITAL_COMMUNITY): Payer: Self-pay | Admitting: Psychiatry

## 2012-11-24 ENCOUNTER — Ambulatory Visit (HOSPITAL_COMMUNITY): Payer: Self-pay | Admitting: Marriage and Family Therapist

## 2012-11-24 ENCOUNTER — Encounter (HOSPITAL_COMMUNITY): Payer: Self-pay

## 2012-11-24 ENCOUNTER — Telehealth (HOSPITAL_COMMUNITY): Payer: Self-pay

## 2012-11-24 NOTE — Progress Notes (Unsigned)
   THERAPIST PROGRESS NOTE  Session Time:  8:00 - 9:00 a.m.  Participation Level: Did Not Attend  Behavioral Response:   Type of Therapy: Individual Therapy  Treatment Goals addressed: Coping  Interventions:   Summary: Danielle [name] is a 42 y.o. female who presents with schizophrenia, depression, and PTSD.  She was referred by Dr. Lolly Mustache.     Suicidal/Homicidal:   Therapist Response:  PATIENT DID NOT SHOW FOR HER APPOINTMENT.  SENT A NO-SHOW LETTER BUT DID NOT CHARGE.  Plan: Return again in 2 weeks.  Diagnosis: Axis I: Schizophrenia, paranoid type; Depression NOS; PTSD    Axis II: Deferred    Danielle Nishida, LMFT, CTS 11/24/2012

## 2012-12-10 ENCOUNTER — Telehealth (HOSPITAL_COMMUNITY): Payer: Self-pay

## 2012-12-11 ENCOUNTER — Ambulatory Visit (HOSPITAL_COMMUNITY): Payer: Self-pay | Admitting: Psychiatry

## 2012-12-11 ENCOUNTER — Telehealth (HOSPITAL_COMMUNITY): Payer: Self-pay | Admitting: Psychiatry

## 2012-12-11 NOTE — Telephone Encounter (Signed)
Patient is in rehab. She fell and broke her leg. Husband not sure if she can come from appointment. Recommended to call for appointment once she finished her rehab.

## 2013-02-16 ENCOUNTER — Encounter (INDEPENDENT_AMBULATORY_CARE_PROVIDER_SITE_OTHER): Payer: Self-pay

## 2013-02-16 ENCOUNTER — Encounter (HOSPITAL_COMMUNITY): Payer: Self-pay | Admitting: Psychiatry

## 2013-02-16 ENCOUNTER — Ambulatory Visit (INDEPENDENT_AMBULATORY_CARE_PROVIDER_SITE_OTHER): Payer: BC Managed Care – PPO | Admitting: Psychiatry

## 2013-02-16 VITALS — BP 105/78 | HR 88 | Ht 71.0 in | Wt 229.2 lb

## 2013-02-16 DIAGNOSIS — Z79899 Other long term (current) drug therapy: Secondary | ICD-10-CM

## 2013-02-16 DIAGNOSIS — F200 Paranoid schizophrenia: Secondary | ICD-10-CM

## 2013-02-16 DIAGNOSIS — F4310 Post-traumatic stress disorder, unspecified: Secondary | ICD-10-CM

## 2013-02-16 MED ORDER — DIVALPROEX SODIUM ER 500 MG PO TB24: 1000.0000 mg | ORAL_TABLET | ORAL | Status: DC

## 2013-02-16 MED ORDER — HALOPERIDOL 5 MG PO TABS: ORAL_TABLET | ORAL | Status: DC

## 2013-02-16 MED ORDER — LAMOTRIGINE 200 MG PO TABS: 200.0000 mg | ORAL_TABLET | Freq: Every day | ORAL | Status: DC
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
## 2013-02-24 ENCOUNTER — Telehealth (HOSPITAL_COMMUNITY): Payer: Self-pay | Admitting: Marriage and Family Therapist

## 2013-02-24 NOTE — Telephone Encounter (Signed)
Spoke with patient's husband [name] regarding the letter sent referring [facility] to [facility] per discussion with Dr. Lolly Mustache and myself. Explained to husband that her needs would be better suited there due to the amount of resources for her.  Husband stated he never saw the letter and believe the daughter may have received it.  Husband will receive letter and if they need help told him patient could call.  Cleophas Dunker, LMFT, CTS

## 2013-03-03 ENCOUNTER — Ambulatory Visit (INDEPENDENT_AMBULATORY_CARE_PROVIDER_SITE_OTHER): Payer: BC Managed Care – PPO | Admitting: Marriage and Family Therapist

## 2013-03-03 ENCOUNTER — Ambulatory Visit (HOSPITAL_COMMUNITY): Payer: Self-pay | Admitting: Marriage and Family Therapist

## 2013-03-03 DIAGNOSIS — F339 Major depressive disorder, recurrent, unspecified: Secondary | ICD-10-CM

## 2013-03-03 DIAGNOSIS — F4310 Post-traumatic stress disorder, unspecified: Secondary | ICD-10-CM

## 2013-03-03 DIAGNOSIS — F329 Major depressive disorder, single episode, unspecified: Secondary | ICD-10-CM

## 2013-03-03 DIAGNOSIS — F3289 Other specified depressive episodes: Secondary | ICD-10-CM
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
## 2013-03-17 ENCOUNTER — Ambulatory Visit (INDEPENDENT_AMBULATORY_CARE_PROVIDER_SITE_OTHER): Payer: BC Managed Care – PPO | Admitting: Marriage and Family Therapist

## 2013-03-17 DIAGNOSIS — F4310 Post-traumatic stress disorder, unspecified: Secondary | ICD-10-CM

## 2013-03-17 DIAGNOSIS — F200 Paranoid schizophrenia: Secondary | ICD-10-CM

## 2013-03-17 DIAGNOSIS — F329 Major depressive disorder, single episode, unspecified: Secondary | ICD-10-CM
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
## 2013-03-25 ENCOUNTER — Telehealth (HOSPITAL_COMMUNITY): Payer: Self-pay | Admitting: Marriage and Family Therapist

## 2013-03-25 NOTE — Telephone Encounter (Signed)
Spoke with patient's Husband [name].  Told husband that in light of how things have been going in therapy patient was not suitable for therapy (see notes).  Referred her back to [facility] [place].  Husband stated patient started having auditory hallucinations again.  Recommended he contact Dr. Lolly Mustache if they increased but also if they became severe he already knew to take patient to our admissions office.  He agreed.  Will sent out letter regarding referral.  Cleophas Dunker, LMFT, CTS Counselor

## 2013-03-29 ENCOUNTER — Telehealth (HOSPITAL_COMMUNITY): Payer: Self-pay

## 2013-03-29 NOTE — Telephone Encounter (Signed)
I returned patient's husband phone call.  Patient is experiencing increase hallucination.  She is going to visit family member in [place].  Husband endorsed that she's been more tense and unable to sleep.  Husband denies any suicidal thoughts or homicidal thoughts.  She is also upset because therapist refused to see her anymore.  Therapist told the patient does not talk in the session and it is not therapeutic.  I recommend to try Haldol 5 mg 4 tablet at bedtime and call us back if it is working okay.  Husband acknowledge and will call us back if he has any further question.

## 2013-03-31 ENCOUNTER — Ambulatory Visit (HOSPITAL_COMMUNITY): Payer: Self-pay | Admitting: Marriage and Family Therapist

## 2013-04-05 ENCOUNTER — Telehealth (HOSPITAL_COMMUNITY): Payer: Self-pay | Admitting: Psychiatry

## 2013-04-05 ENCOUNTER — Encounter (HOSPITAL_COMMUNITY): Payer: Self-pay | Admitting: Emergency Medicine

## 2013-04-05 ENCOUNTER — Telehealth (HOSPITAL_COMMUNITY): Payer: Self-pay

## 2013-04-05 ENCOUNTER — Emergency Department (HOSPITAL_COMMUNITY)
Admission: EM | Admit: 2013-04-05 | Discharge: 2013-04-06 | Disposition: A | Discharge status: Home / Self Care | Payer: BC Managed Care – PPO | Attending: Emergency Medicine | Admitting: Emergency Medicine

## 2013-04-05 DIAGNOSIS — Z8781 Personal history of (healed) traumatic fracture: Secondary | ICD-10-CM | POA: Insufficient documentation

## 2013-04-05 DIAGNOSIS — R440 Auditory hallucinations: Secondary | ICD-10-CM

## 2013-04-05 DIAGNOSIS — Z79899 Other long term (current) drug therapy: Secondary | ICD-10-CM | POA: Insufficient documentation

## 2013-04-05 DIAGNOSIS — F209 Schizophrenia, unspecified: Secondary | ICD-10-CM | POA: Insufficient documentation

## 2013-04-05 DIAGNOSIS — Z862 Personal history of diseases of the blood and blood-forming organs and certain disorders involving the immune mechanism: Secondary | ICD-10-CM | POA: Insufficient documentation

## 2013-04-05 DIAGNOSIS — G40909 Epilepsy, unspecified, not intractable, without status epilepticus: Secondary | ICD-10-CM | POA: Insufficient documentation

## 2013-04-05 DIAGNOSIS — F4310 Post-traumatic stress disorder, unspecified: Secondary | ICD-10-CM | POA: Insufficient documentation

## 2013-04-05 DIAGNOSIS — Z87891 Personal history of nicotine dependence: Secondary | ICD-10-CM | POA: Insufficient documentation

## 2013-04-05 DIAGNOSIS — F3130 Bipolar disorder, current episode depressed, mild or moderate severity, unspecified: Secondary | ICD-10-CM | POA: Insufficient documentation

## 2013-04-05 DIAGNOSIS — R011 Cardiac murmur, unspecified: Secondary | ICD-10-CM | POA: Insufficient documentation

## 2013-04-05 DIAGNOSIS — R45851 Suicidal ideations: Secondary | ICD-10-CM | POA: Insufficient documentation

## 2013-04-05 DIAGNOSIS — E039 Hypothyroidism, unspecified: Secondary | ICD-10-CM | POA: Insufficient documentation

## 2013-04-05 DIAGNOSIS — R441 Visual hallucinations: Secondary | ICD-10-CM

## 2013-04-05 DIAGNOSIS — F411 Generalized anxiety disorder: Secondary | ICD-10-CM | POA: Insufficient documentation

## 2013-04-05 HISTORY — DX: Bipolar disorder, unspecified: F31.9

## 2013-04-05 LAB — ETHANOL: Alcohol, Ethyl (B): <11 mg/dL (ref 0–11)

## 2013-04-05 LAB — RAPID URINE DRUG SCREEN, HOSP PERFORMED
Amphetamines: NOT DETECTED
Benzodiazepines: NOT DETECTED
Cocaine: NOT DETECTED

## 2013-04-05 LAB — COMPREHENSIVE METABOLIC PANEL
ALT: 10 U/L (ref 0–35)
AST: 11 U/L (ref 0–37)
Calcium: 9.7 mg/dL (ref 8.4–10.5)
GFR calc Af Amer: >90 mL/min (ref >90)
Glucose, Bld: 100 mg/dL — ABNORMAL HIGH (ref 70–99)
Sodium: 137 mEq/L (ref 135–145)
Total Protein: 7.3 g/dL (ref 6.0–8.3)

## 2013-04-05 LAB — CBC WITH DIFFERENTIAL/PLATELET
Basophils Absolute: 0 10*3/uL (ref 0.0–0.1)
Eosinophils Absolute: 0.1 10*3/uL (ref 0.0–0.7)
Eosinophils Relative: 2 % (ref 0–5)
MCH: 31.4 pg (ref 26.0–34.0)
MCHC: 33.8 g/dL (ref 30.0–36.0)
MCV: 92.8 fL (ref 78.0–100.0)
Platelets: 287 10*3/uL (ref 150–400)
RDW: 12.3 % (ref 11.5–15.5)

## 2013-04-05 MED ORDER — ALUM & MAG HYDROXIDE-SIMETH 200-200-20 MG/5ML PO SUSP
30.0000 mL | ORAL | Status: DC | PRN
  Filled 2013-04-05: qty 30

## 2013-04-05 MED ORDER — LAMOTRIGINE 200 MG PO TABS
200.0000 mg | ORAL_TABLET | Freq: Every day | ORAL | Status: DC
  Administered 2013-04-05: 200 mg via ORAL
  Filled 2013-04-05 (×2): qty 1

## 2013-04-05 MED ORDER — DIVALPROEX SODIUM ER 500 MG PO TB24
1000.0000 mg | ORAL_TABLET | ORAL | Status: DC
  Administered 2013-04-05: 1000 mg via ORAL
  Filled 2013-04-05 (×4): qty 2

## 2013-04-05 MED ORDER — LEVOTHYROXINE SODIUM 100 MCG PO TABS
100.0000 ug | ORAL_TABLET | Freq: Every day | ORAL | Status: DC
  Filled 2013-04-05 (×2): qty 1

## 2013-04-05 MED ORDER — HALOPERIDOL 5 MG PO TABS
15.0000 mg | ORAL_TABLET | Freq: Every evening | ORAL | Status: DC | PRN
  Administered 2013-04-05: 15 mg via ORAL
  Filled 2013-04-05: qty 3

## 2013-04-05 MED ORDER — ACETAMINOPHEN 325 MG PO TABS: 650.0000 mg | ORAL_TABLET | ORAL | Status: DC | PRN

## 2013-04-05 MED ORDER — HALOPERIDOL 5 MG PO TABS: 5.0000 mg | ORAL_TABLET | Freq: Every evening | ORAL | Status: DC | PRN

## 2013-04-05 MED ORDER — LORAZEPAM 1 MG PO TABS: 1.0000 mg | ORAL_TABLET | Freq: Three times a day (TID) | ORAL | Status: DC | PRN

## 2013-04-05 MED ORDER — ONDANSETRON HCL 4 MG PO TABS: 4.0000 mg | ORAL_TABLET | Freq: Three times a day (TID) | ORAL | Status: DC | PRN

## 2013-04-05 MED ORDER — IBUPROFEN 200 MG PO TABS: 600.0000 mg | ORAL_TABLET | Freq: Three times a day (TID) | ORAL | Status: DC | PRN

## 2013-04-05 NOTE — Telephone Encounter (Signed)
I returned patient's husband phone call.  Patient continued to have hallucinations despite increasing Haldol.  Patient cannot contract for safety.  Recommended to bring patient emergency room for further observation and possible inpatient treatment.
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
## 2013-04-05 NOTE — ED Notes (Addendum)
Patient reports hearing voices inside her head telling her to kill herself. patient denies HI. Patient states she had a previous attempt several years ago by cutting her wrists. Patienat denies alcohol or drug use. Patient has a history of a right fracture in the right foot and is currently wearing a boot.

## 2013-04-05 NOTE — ED Provider Notes (Signed)
Patient care assumed from [facility], PA-C at shift change. Patient presenting today for auditory hallucinations telling her to kill herself. Labs unremarkable today. Patient medically cleared for TTS eval.  Antony Madura, PA-C 04/06/13 1823

## 2013-04-05 NOTE — ED Provider Notes (Signed)
CSN: [id number]     Arrival date & time 04/05/13  1752 History   First MD Initiated Contact with Patient 04/05/13 1807      This chart was scribed for non-physician practitioner, Santiago Glad PA-C, working with Candyce Churn, MD by Arlan Organ, ED Scribe. This patient was seen in room WTR1/WLPT1 and the patient's care was started at 7:06 PM.   Chief Complaint  Patient presents with  . Medical Clearance    The history is provided by the patient and the spouse. No language interpreter was used.    HPI Comments: Danielle [name] is a 42 y.o. female who presents to the Emergency Department complaining of visual and auditory hallucinations that started a few years ago, but has worsened in the last 3 weeks. Pt reports hearing voices specifically telling her to kill herself. She says she has also seen people she describes as "shadow people". Pt denies HI. She says she had a previous suicide attempt several years ago in which she cut her wrists several times. She denies a current plan.  Husband states her medications were recently altered, which he believes triggered the severity of her hallucinations. Pt states she had surgery on her right foot due to a fracture in July. At that time, she was taken off of her medication. Husband says when she was put back on her medications, she was only put back on Haldol, Depakote, Lamictal, and Levothyroxine . She rates her current pain to her right foot 7-8/10. Pt denies alcohol or drug use.  Past Medical History  Diagnosis Date  . Depressed   . Seizures   . Heart murmur   . Hypothyroidism   . Anemia   . Headache(784.0)   . Anxiety   . PTSD (post-traumatic stress disorder)   . Shortness of breath   . Schizophrenia   . Bipolar 1 disorder    Past Surgical History  Procedure Laterality Date  . Tubal ligation  1996  . Foot surgery     Family History  Problem Relation Age of Onset  . Adopted: Yes   History  Substance Use Topics  . Smoking  status: Former Smoker -- 1.50 packs/day for 30 years    Types: Cigarettes    Quit date: 09/20/2011  . Smokeless tobacco: Never Used  . Alcohol Use: No   OB History   Grav Para Term Preterm Abortions TAB SAB Ect Mult Living                 Review of Systems  Psychiatric/Behavioral: Positive for suicidal ideas and hallucinations.  All other systems reviewed and are negative.    Allergies  Tomato; Imitrex; and Mustard seed  Home Medications   Current Outpatient Rx  Name  Route  Sig  Dispense  Refill  . divalproex (DEPAKOTE ER) 500 MG 24 hr tablet   Oral   Take 2 tablets (1,000 mg total) by mouth 2 (two) times daily in the am and at bedtime.. For mood stability and seizure prevention.   120 tablet   1   . haloperidol (HALDOL) 5 MG tablet      5 mg. Take 4 tab at bed time.         . lamoTRIgine (LAMICTAL) 200 MG tablet   Oral   Take 1 tablet (200 mg total) by mouth at bedtime. For mood stability   30 tablet   1   . levothyroxine (SYNTHROID, LEVOTHROID) 100 MCG tablet   Oral   Take  100 mcg by mouth daily before breakfast.          Triage Vitals: BP 128/80  Pulse 82  Temp(Src) 97.8 F (36.6 C) (Oral)  Resp 16  SpO2 93%  Physical Exam  Nursing note and vitals reviewed. Constitutional: She is oriented to person, place, and time. She appears well-developed and well-nourished.  HENT:  Head: Normocephalic and atraumatic.  Mouth/Throat: Oropharynx is clear and moist.  Eyes: EOM are normal.  Neck: Normal range of motion. Neck supple.  Cardiovascular: Normal rate, regular rhythm and normal heart sounds.   Pulses:      Dorsalis pedis pulses are 2+ on the right side, and 2+ on the left side.  Pulmonary/Chest: Effort normal and breath sounds normal.  Musculoskeletal: Normal range of motion.  Neurological: She is alert and oriented to person, place, and time.  Distal sensation of the right foot intact  Skin: Skin is warm and dry.  Psychiatric: Her behavior is  normal. Thought content is not paranoid and not delusional. She exhibits a depressed mood. She expresses suicidal ideation. She expresses no homicidal ideation. She expresses no homicidal plans.    ED Course  Procedures (including critical care time)  DIAGNOSTIC STUDIES: Oxygen Saturation is 93% on RA, Adequate by my interpretation.    COORDINATION OF CARE: 7:14 PM- Will transfer pt to psych ED. Discussed treatment plan with pt at bedside and pt agreed to plan.     Labs Review Labs Reviewed  CBC WITH DIFFERENTIAL  COMPREHENSIVE METABOLIC PANEL  URINE RAPID DRUG SCREEN (HOSP PERFORMED)  ETHANOL   Imaging Review No results found.  EKG Interpretation   None       MDM  No diagnosis found. Patient presenting with AH or VH.  She reports that she is hearing voices telling her to kill himself.   TTS consulted.  Psych holding orders placed.  I personally performed the services described in this documentation, which was scribed in my presence. The recorded information has been reviewed and is accurate.   Santiago Glad, PA-C 04/05/13 2005  Santiago Glad, PA-C 04/05/13 2010

## 2013-04-05 NOTE — ED Notes (Signed)
Family has patient's belongings. 

## 2013-04-06 ENCOUNTER — Inpatient Hospital Stay (HOSPITAL_COMMUNITY)
Admission: EM | Admit: 2013-04-06 | Discharge: 2013-04-16 | DRG: 885 | Disposition: A | Discharge status: Home / Self Care | Payer: BC Managed Care – PPO | Source: Intra-hospital | Attending: Emergency Medicine | Admitting: Emergency Medicine

## 2013-04-06 ENCOUNTER — Encounter (HOSPITAL_COMMUNITY): Payer: Self-pay | Admitting: *Deleted

## 2013-04-06 DIAGNOSIS — F39 Unspecified mood [affective] disorder: Secondary | ICD-10-CM

## 2013-04-06 DIAGNOSIS — R569 Unspecified convulsions: Secondary | ICD-10-CM

## 2013-04-06 DIAGNOSIS — F200 Paranoid schizophrenia: Secondary | ICD-10-CM

## 2013-04-06 DIAGNOSIS — E039 Hypothyroidism, unspecified: Secondary | ICD-10-CM

## 2013-04-06 DIAGNOSIS — I471 Supraventricular tachycardia, unspecified: Secondary | ICD-10-CM

## 2013-04-06 DIAGNOSIS — R51 Headache: Secondary | ICD-10-CM

## 2013-04-06 DIAGNOSIS — F339 Major depressive disorder, recurrent, unspecified: Secondary | ICD-10-CM

## 2013-04-06 DIAGNOSIS — Z79899 Other long term (current) drug therapy: Secondary | ICD-10-CM

## 2013-04-06 DIAGNOSIS — F319 Bipolar disorder, unspecified: Secondary | ICD-10-CM | POA: Diagnosis present

## 2013-04-06 DIAGNOSIS — F4310 Post-traumatic stress disorder, unspecified: Secondary | ICD-10-CM

## 2013-04-06 DIAGNOSIS — G4733 Obstructive sleep apnea (adult) (pediatric): Secondary | ICD-10-CM

## 2013-04-06 MED ORDER — LEVOTHYROXINE SODIUM 100 MCG PO TABS
100.0000 ug | ORAL_TABLET | Freq: Every day | ORAL | Status: DC
  Administered 2013-04-06 – 2013-04-16 (×11): 100 ug via ORAL
  Filled 2013-04-06 (×15): qty 1

## 2013-04-06 MED ORDER — HALOPERIDOL 2 MG PO TABS: 2.0000 mg | ORAL_TABLET | Freq: Two times a day (BID) | ORAL | Status: DC

## 2013-04-06 MED ORDER — TRAZODONE HCL 50 MG PO TABS
50.0000 mg | ORAL_TABLET | Freq: Every evening | ORAL | Status: DC | PRN
  Administered 2013-04-08: 50 mg via ORAL
  Filled 2013-04-06: qty 1

## 2013-04-06 MED ORDER — HYDROXYZINE HCL 50 MG PO TABS: 50.0000 mg | ORAL_TABLET | Freq: Every evening | ORAL | Status: DC | PRN

## 2013-04-06 MED ORDER — MAGNESIUM HYDROXIDE 400 MG/5ML PO SUSP: 30.0000 mL | Freq: Every day | ORAL | Status: DC | PRN

## 2013-04-06 MED ORDER — ALUM & MAG HYDROXIDE-SIMETH 200-200-20 MG/5ML PO SUSP: 30.0000 mL | ORAL | Status: DC | PRN

## 2013-04-06 MED ORDER — PALIPERIDONE ER 6 MG PO TB24
6.0000 mg | ORAL_TABLET | Freq: Every day | ORAL | Status: DC
  Administered 2013-04-06 – 2013-04-08 (×3): 6 mg via ORAL
  Filled 2013-04-06 (×4): qty 1

## 2013-04-06 MED ORDER — DIVALPROEX SODIUM ER 500 MG PO TB24
1000.0000 mg | ORAL_TABLET | ORAL | Status: DC
  Administered 2013-04-06: 1000 mg via ORAL
  Filled 2013-04-06 (×4): qty 2

## 2013-04-06 MED ORDER — TRIHEXYPHENIDYL HCL 2 MG PO TABS
2.0000 mg | ORAL_TABLET | Freq: Two times a day (BID) | ORAL | Status: DC
  Administered 2013-04-06 – 2013-04-09 (×6): 2 mg via ORAL
  Filled 2013-04-06 (×8): qty 1

## 2013-04-06 MED ORDER — LAMOTRIGINE 200 MG PO TABS
200.0000 mg | ORAL_TABLET | Freq: Every day | ORAL | Status: DC
  Administered 2013-04-06 – 2013-04-12 (×7): 200 mg via ORAL
  Filled 2013-04-06 (×9): qty 1

## 2013-04-06 MED ORDER — ACETAMINOPHEN 325 MG PO TABS
650.0000 mg | ORAL_TABLET | Freq: Four times a day (QID) | ORAL | Status: DC | PRN
  Administered 2013-04-06 – 2013-04-13 (×3): 650 mg via ORAL
  Filled 2013-04-06 (×3): qty 2

## 2013-04-06 MED ORDER — DIVALPROEX SODIUM ER 250 MG PO TB24
750.0000 mg | ORAL_TABLET | ORAL | Status: DC
  Administered 2013-04-06 – 2013-04-14 (×16): 750 mg via ORAL
  Filled 2013-04-06 (×23): qty 3

## 2013-04-06 NOTE — [facility] Group Notes (Signed)
[facility] LCSW Group Therapy  04/06/2013 , 12:31 PM   Type of Therapy:  Group Therapy  Participation Level: Did not attend  Summary of Progress/Problems: Today's group focused on the term Diagnosis.  Participants were asked to define the term, and then pronounce whether it is a negative, positive or neutral term.  [name] B 04/06/2013 , 12:31 PM

## 2013-04-06 NOTE — ED Provider Notes (Signed)
Pt presents with SI, with hx of suicide attempts in the past, and auditory and visual hallucinations. Pt has been accepted at [facility] by Dr Jannifer Franklin.  Pt is ambulatory with a cam walker on his right leg, states she broke her leg in July. Pt states she is feeling "so-so" and is agreeable to being admitted to [facility].    Devoria Albe, MD, FACEP   Ward Givens, MD 04/06/13 [phone]

## 2013-04-06 NOTE — [facility] Suicide Risk Assessment (Signed)
Suicide Risk Assessment  Admission Assessment     Nursing information obtained from:  Patient Demographic factors:  Caucasian;Low socioeconomic status;Unemployed Current Mental Status:  Suicidal ideation indicated by patient Loss Factors:  Decrease in vocational status Historical Factors:  Prior suicide attempts Risk Reduction Factors:  Sense of responsibility to family  CLINICAL FACTORS:   Severe Anxiety and/or Agitation Depression:   Anhedonia Delusional Hopelessness Impulsivity Insomnia Schizophrenia:   Command hallucinatons Paranoid or undifferentiated type Currently Psychotic  COGNITIVE FEATURES THAT CONTRIBUTE TO RISK:  Closed-mindedness Polarized thinking    SUICIDE RISK:   Mild:  Suicidal ideation of limited frequency, intensity, duration, and specificity.  There are no identifiable plans, no associated intent, mild dysphoria and related symptoms, good self-control (both objective and subjective assessment), few other risk factors, and identifiable protective factors, including available and accessible social support.  PLAN OF CARE:1. Admit for crisis management and stabilization. 2. Medication management to reduce current symptoms to base line and improve the     patient's overall level of functioning 3. Treat health problems as indicated. 4. Develop treatment plan to decrease risk of relapse upon discharge and the need for     readmission. 5. Psycho-social education regarding relapse prevention and self care. 6. Health care follow up as needed for medical problems. 7. Restart home medications where appropriate.   I certify that inpatient services furnished can reasonably be expected to improve the patient's condition.  Thedore Mins, MD 04/06/2013, 10:54 AM
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
## 2013-04-06 NOTE — Tx Team (Signed)
Initial Interdisciplinary Treatment Plan  PATIENT STRENGTHS: (choose at least two) Ability for insight Average or above average intelligence Communication skills General fund of knowledge Supportive family/friends  PATIENT STRESSORS: Health problems Medication change or noncompliance   PROBLEM LIST: Problem List/Patient Goals Date to be addressed Date deferred Reason deferred Estimated date of resolution  Suicidal Ideations 04-06-13     Depression 04-06-13     Medication Management 04-06-13     AVH 04-06-13     Surgery to right foot 04-06-13                              DISCHARGE CRITERIA:  Ability to meet basic life and health needs Adequate post-discharge living arrangements Improved stabilization in mood, thinking, and/or behavior Medical problems require only outpatient monitoring Need for constant or close observation no longer present Reduction of life-threatening or endangering symptoms to within safe limits  PRELIMINARY DISCHARGE PLAN: Attend aftercare/continuing care group Participate in family therapy  PATIENT/FAMIILY INVOLVEMENT: This treatment plan has been presented to and reviewed with the patient, Danielle [name], and/or family membe.  The patient and family have been given the opportunity to ask questions and make suggestions.  Danielle [name] 04/06/2013, 6:42 AM
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
## 2013-04-06 NOTE — Progress Notes (Addendum)
Patient ID: BEAUTIFUL PENSYL, female   DOB: 16-Oct-1970, 42 y.o.   MRN: [id number] Pt. Is a 42 yo female being admitted for complaints of suicidal ideations, with no plan. Pt. Contract for safety verbally. Pt. Reports "they took me off my medicine when I had my foot surgery. Pt. Said she fell in the bathtub in July and broke her right leg. Pt. Currently wearing a boot. Pt. Also reports AVH. "hear voices tell me I'm stupid and no good". Pt. Did not elaborate on visual hallucinations. Pt. Report increased depressing and AVH since she was taken off her medications. Pt. Is cooperative during assessment. Pt. Was at [facility] in 10/13. Pt. Lives with her husband who is very supportive. Pt. Has med history of seizures. Pt. Oriented to unit/room and taken to breakfast. Staff will monitor q66min for safety.

## 2013-04-07 MED ORDER — NAPROXEN 250 MG PO TABS
250.0000 mg | ORAL_TABLET | Freq: Two times a day (BID) | ORAL | Status: DC
  Administered 2013-04-07 – 2013-04-16 (×17): 250 mg via ORAL
  Filled 2013-04-07 (×25): qty 1

## 2013-04-07 MED ORDER — OLANZAPINE 5 MG PO TBDP
5.0000 mg | ORAL_TABLET | Freq: Three times a day (TID) | ORAL | Status: DC | PRN
  Administered 2013-04-08 – 2013-04-09 (×2): 5 mg via ORAL
  Filled 2013-04-07 (×2): qty 1

## 2013-04-07 NOTE — Progress Notes (Signed)
Adult Psychoeducational Group Note  Date:  04/07/2013 Time:  12:33 PM  Group Topic/Focus:  Dimensions of Wellness:   The focus of this group is to introduce the topic of wellness and discuss the role each dimension of wellness plays in total health.  Participation Level:  Did Not Attend  Participation Quality:  Did Not Attend  Affect:  Did Not Attend  Cognitive:  Did Not Attend  Insight: None  Engagement in Group:  Did Not Attend  Modes of Intervention:  Education  Additional Comments:  Patient did not attend psychoeducation group.  Merleen Milliner 04/07/2013, 12:33 PM

## 2013-04-07 NOTE — Progress Notes (Signed)
D: Pt denies SI/HI/AVH. Pt is pleasant and cooperative. Pt stayed in bed and only woke up to take medication.  A: Pt was offered support and encouragement. Pt was given scheduled medications. Pt was encourage to attend groups. Q 15 minute checks were done for safety.   R: Pt is taking medication. Pt has no complaints at this time.Pt receptive to treatment and safety maintained on unit.

## 2013-04-07 NOTE — Progress Notes (Signed)
The focus of this group is to help patients review their daily goal of treatment and discuss progress on daily workbooks. Pt attended the evening group session and responded to all discussion prompts from the Writer. Pt shared that today was a good day, the highlight of which was her husband coming to visit her at lunch. Pt reported having no additional needs from Nursing Staff this evening other than snack, which was given to her following group. Pt's affect was flat, though the Writer observed the Pt smiling when being spoken to.

## 2013-04-07 NOTE — Progress Notes (Signed)
Patient ID: Danielle [name], female   DOB: 1970/06/07, 42 y.o.   MRN: [id number] D: Patient remains isolative to room.  She has been encouraged to get up and attend groups and participate in her treatment.  She remains with flat, blunted affect.  She has passive SI, however, contracts for safety.  Patient has been going down to the cafeteria for meals.  She denies HI, however, endorses auditory and visual hallucinations.  Patient remains depressed and sad.  Her husband came to visit her for lunch. She states he is very supportive of her.  A: continue to monitor medication management and MD orders.  Safety checks completed every 15 minutes for safety.  R: patient is receptive to staff with minimal interaction.  Her behavior is appropriate.

## 2013-04-07 NOTE — ED Provider Notes (Signed)
Medical screening examination/treatment/procedure(s) were performed by non-physician practitioner and as supervising physician I was immediately available for consultation/collaboration.  EKG Interpretation   None         Candyce Churn, MD 04/07/13 1306

## 2013-04-07 NOTE — [facility] Group Notes (Signed)
[facility] [employer] Group Therapy  04/07/2013  1:53 PM  Type of Therapy:  [employer] Presentation   Participation Level:  Active  Participation Quality:  Appropriate  Affect:  Flat  Cognitive:  Appropriate  Insight:  Improving  Engagement in Therapy:  Engaged  Modes of Intervention:  Discussion, Education and Socialization   Summary of Progress/Problems:  Onalee Hua from [employer] came to present his recovery story and play the guitar.  Danielle [name] listened quietly as the speaker told his story.   She asked about the difference between schizoaffective disorder and paranoid schizophrenia disorder.  She applauded the speaker after each song.    [name]   04/07/2013  1:53 PM

## 2013-04-07 NOTE — [facility] Group Notes (Signed)
[facility] LCSW Aftercare Discharge Planning Group Note   04/07/2013 1:36 PM  Participation Quality:  Engaged  Mood/Affect:  Depressed and Flat  Depression Rating:  8  Anxiety Rating:  7  Thoughts of Suicide:  No Will you contract for safety?   NA  Current AVH:  Yes  Plan for Discharge/Comments:  Danielle [name] believes her stay with [place] this time was necessitated by the fact that she was taken off some of her meds during surgery on her foot in July, and her symptoms of AH/VH resumed as a result.  Also believes she was on too many meds prior to surgery, which is how she broke her bones.  "I was too sedated, didn't have my equilibrium, and I fell.  Has 3 kids and 3 grandchildren at home.  Two of the children are adults-27 and 23.  "We have to move mid-January, and my oldest daughter with her 3 children are going to have to find their own place.  She can get support through work first."  C/O psychosis today.  "The meds aren't working."  [employer]: family  Supports: family  [place], Baldo Daub

## 2013-04-07 NOTE — Progress Notes (Signed)
[facility] MD Progress Note  04/07/2013 3:15 PM Danielle [name]  MRN:  [id number] Subjective:   Patient states "I am still hearing voices and seeing things. I hear people calling me dumb and then I see shadow people. It is happening all day. I am scared the shadows will hurt me. I am very afraid."   Objective:  Patient continues to endorse symptoms of psychosis and depression. She expresses frustration that her symptoms have not improved but was just started on Invega yesterday. Patient has been attending some of the groups on the unit but complains of very poor focus because of psychosis. She is compliant with her scheduled and so far has not endorsed any adverse effects. Patient complains of right foot pain and is asking for something stronger than tylenol. The patient has boot in place reporting she has follow up appointment on 04/14/13.   Diagnosis:   DSM5:  Schizophrenia Disorders: Paranoid schizophrenia  Obsessive-Compulsive Disorders:  Trauma-Stressor Disorders: Posttraumatic Stress Disorder (309.81)  Substance/Addictive Disorders:  Depressive Disorders:  AXIS I: Schizophrenia, paranoid type  AXIS II: Deferred  AXIS III:  Past Medical History   Diagnosis  Date   .  Depressed    .  Seizures    .  Heart murmur    .  Hypothyroidism    .  Anemia    .  Headache(784.0)    .  Anxiety    .  PTSD (post-traumatic stress disorder)    .  Shortness of breath    .  Schizophrenia    .  Bipolar 1 disorder     AXIS IV: problems related to social environment and problems with primary support group  AXIS V: 41-50 serious symptoms  ADL's:  Intact  Sleep: Good  Appetite:  Good  Suicidal Ideation:  Passive SI no plan Homicidal Ideation:  Denies AEB (as evidenced by):  Psychiatric Specialty Exam: Review of Systems  Constitutional: Negative.   HENT: Negative.   Eyes: Negative.   Respiratory: Negative.   Cardiovascular: Negative.   Gastrointestinal: Negative.   Genitourinary: Negative.    Musculoskeletal: Positive for joint pain (Patient has right foot pain from previous fracture. ).  Skin: Negative.   Neurological: Negative.   Endo/Heme/Allergies: Negative.   Psychiatric/Behavioral: Positive for depression, suicidal ideas and hallucinations. Negative for memory loss and substance abuse. The patient is nervous/anxious. The patient does not have insomnia.     Blood pressure 125/89, pulse 113, temperature 98.5 F (36.9 C), temperature source Oral, resp. rate 18, height 5\' 11"  (1.803 m), weight 108.41 kg (239 lb).Body mass index is 33.35 kg/(m^2).  General Appearance: Casual  Eye Contact::  Good  Speech:  Clear and Coherent  Volume:  Normal  Mood:  Anxious and Dysphoric  Affect:  Congruent  Thought Process:  Goal Directed and Intact  Orientation:  Full (Time, Place, and Person)  Thought Content:  Hallucinations: Auditory Visual, Paranoid Ideation and Rumination  Suicidal Thoughts:  Yes.  without intent/plan  Homicidal Thoughts:  No  Memory:  Immediate;   Good Recent;   Good Remote;   Good  Judgement:  Impaired  Insight:  Shallow  Psychomotor Activity:  Decreased  Concentration:  Fair  Recall:  Good  Akathisia:  No  Handed:  Right  AIMS (if indicated):     Assets:  Communication Skills Desire for Improvement Physical Health Resilience Social Support  Sleep:  Number of Hours: 6.75   Current Medications: Current Facility-Administered Medications  Medication Dose Route Frequency Provider Last Rate  Last Dose  . acetaminophen (TYLENOL) tablet 650 mg  650 mg Oral Q6H PRN Kerry Hough, PA-C   650 mg at 04/07/13 0809  . alum & mag hydroxide-simeth (MAALOX/MYLANTA) 200-200-20 MG/5ML suspension 30 mL  30 mL Oral Q4H PRN Kerry Hough, PA-C      . divalproex (DEPAKOTE ER) 24 hr tablet 750 mg  750 mg Oral BH-qamhs Mojeed Akintayo   750 mg at 04/07/13 0808  . lamoTRIgine (LAMICTAL) tablet 200 mg  200 mg Oral QHS Kerry Hough, PA-C   200 mg at 04/06/13 2224  .  levothyroxine (SYNTHROID, LEVOTHROID) tablet 100 mcg  100 mcg Oral QAC breakfast Kerry Hough, PA-C   100 mcg at 04/07/13 4782  . magnesium hydroxide (MILK OF MAGNESIA) suspension 30 mL  30 mL Oral Daily PRN Kerry Hough, PA-C      . OLANZapine zydis (ZYPREXA) disintegrating tablet 5 mg  5 mg Oral Q8H PRN Fransisca Kaufmann, NP      . paliperidone (INVEGA) 24 hr tablet 6 mg  6 mg Oral Daily Mojeed Akintayo   6 mg at 04/07/13 0808  . traZODone (DESYREL) tablet 50 mg  50 mg Oral QHS PRN Mojeed Akintayo      . trihexyphenidyl (ARTANE) tablet 2 mg  2 mg Oral BID WC Mojeed Akintayo   2 mg at 04/07/13 9562    Lab Results:  Results for orders placed during the hospital encounter of 04/05/13 (from the past 48 hour(s))  URINE RAPID DRUG SCREEN (HOSP PERFORMED)     Status: None   Collection Time    04/05/13  6:52 PM      Result Value Range   Opiates NONE DETECTED  NONE DETECTED   Cocaine NONE DETECTED  NONE DETECTED   Benzodiazepines NONE DETECTED  NONE DETECTED   Amphetamines NONE DETECTED  NONE DETECTED   Tetrahydrocannabinol NONE DETECTED  NONE DETECTED   Barbiturates NONE DETECTED  NONE DETECTED   Comment:            DRUG SCREEN FOR MEDICAL PURPOSES     ONLY.  IF CONFIRMATION IS NEEDED     FOR ANY PURPOSE, NOTIFY LAB     WITHIN 5 DAYS.                LOWEST DETECTABLE LIMITS     FOR URINE DRUG SCREEN     Drug Class       Cutoff (ng/mL)     Amphetamine      1000     Barbiturate      200     Benzodiazepine   200     Tricyclics       300     Opiates          300     Cocaine          300     THC              50  CBC WITH DIFFERENTIAL     Status: None   Collection Time    04/05/13  7:35 PM      Result Value Range   WBC 6.8  4.0 - 10.5 K/uL   RBC 4.05  3.87 - 5.11 MIL/uL   Hemoglobin 12.7  12.0 - 15.0 g/dL   HCT 13.0  86.5 - 78.4 %   MCV 92.8  78.0 - 100.0 fL   MCH 31.4  26.0 - 34.0 pg   MCHC 33.8  30.0 - 36.0 g/dL   RDW 65.7  84.6 - 96.2 %   Platelets 287  150 - 400 K/uL    Neutrophils Relative % 48  43 - 77 %   Neutro Abs 3.2  1.7 - 7.7 K/uL   Lymphocytes Relative 41  12 - 46 %   Lymphs Abs 2.8  0.7 - 4.0 K/uL   Monocytes Relative 10  3 - 12 %   Monocytes Absolute 0.7  0.1 - 1.0 K/uL   Eosinophils Relative 2  0 - 5 %   Eosinophils Absolute 0.1  0.0 - 0.7 K/uL   Basophils Relative 0  0 - 1 %   Basophils Absolute 0.0  0.0 - 0.1 K/uL  COMPREHENSIVE METABOLIC PANEL     Status: Abnormal   Collection Time    04/05/13  7:35 PM      Result Value Range   Sodium 137  135 - 145 mEq/L   Potassium 3.9  3.5 - 5.1 mEq/L   Chloride 104  96 - 112 mEq/L   CO2 22  19 - 32 mEq/L   Glucose, Bld 100 (*) 70 - 99 mg/dL   BUN 19  6 - 23 mg/dL   Creatinine, Ser 9.52  0.50 - 1.10 mg/dL   Calcium 9.7  8.4 - 84.1 mg/dL   Total Protein 7.3  6.0 - 8.3 g/dL   Albumin 3.6  3.5 - 5.2 g/dL   AST 11  0 - 37 U/L   ALT 10  0 - 35 U/L   Alkaline Phosphatase 100  39 - 117 U/L   Total Bilirubin <0.1 (*) 0.3 - 1.2 mg/dL   GFR calc non Af Amer 80 (*) >90 mL/min   GFR calc Af Amer >90  >90 mL/min   Comment: (NOTE)     The eGFR has been calculated using the CKD EPI equation.     This calculation has not been validated in all clinical situations.     eGFR's persistently <90 mL/min signify possible Chronic Kidney     Disease.  ETHANOL     Status: None   Collection Time    04/05/13  7:35 PM      Result Value Range   Alcohol, Ethyl (B) <11  0 - 11 mg/dL   Comment:            LOWEST DETECTABLE LIMIT FOR     SERUM ALCOHOL IS 11 mg/dL     FOR MEDICAL PURPOSES ONLY    Physical Findings: AIMS: Facial and Oral Movements Muscles of Facial Expression: None, normal Lips and Perioral Area: None, normal Jaw: None, normal Tongue: None, normal,Extremity Movements Upper (arms, wrists, hands, fingers): None, normal Lower (legs, knees, ankles, toes): None, normal, Trunk Movements Neck, shoulders, hips: None, normal, Overall Severity Severity of abnormal movements (highest score from questions  above): None, normal Incapacitation due to abnormal movements: None, normal, Dental Status Current problems with teeth and/or dentures?: Yes Does patient usually wear dentures?: Yes (pt. does have with her "I lost them")  CIWA:    COWS:     Treatment Plan Summary: Daily contact with patient to assess and evaluate symptoms and progress in treatment Medication management  Plan: Continue crisis management and stabilization.  Medication management: Reviewed with patient who stated no untoward effects. Continue Depakote 750 mg every am and hs, Lamictal 200 mg hs for improved mood stability. Invega 6 mg Daily for psychosis. Start Zyprexa Zydis 5 mg every eight hours as need for  psychosis.  Encouraged patient to attend groups and participate in group counseling sessions and activities.  Discharge plan in progress.  Continue current treatment plan.  Address health issues: Vitals reviewed and stable. Start Naproxen 250 mg bid for right foot pain.   Medical Decision Making Problem Points:  Established problem, stable/improving (1) and Review of psycho-social stressors (1) Data Points:  Review of medication regiment & side effects (2)  I certify that inpatient services furnished can reasonably be expected to improve the patient's condition.   Mckenna Gamm NP-C 04/07/2013, 3:15 PM

## 2013-04-07 NOTE — ED Provider Notes (Signed)
Medical screening examination/treatment/procedure(s) were performed by non-physician practitioner and as supervising physician I was immediately available for consultation/collaboration.  EKG Interpretation   None         Phi Avans David Torrion Witter, MD 04/07/13 1306 
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
## 2013-04-08 LAB — TSH: TSH: 2.083 u[IU]/mL (ref 0.350–4.500)

## 2013-04-08 MED ORDER — PALIPERIDONE ER 6 MG PO TB24
9.0000 mg | ORAL_TABLET | Freq: Every day | ORAL | Status: DC
  Administered 2013-04-09: 9 mg via ORAL
  Filled 2013-04-08 (×2): qty 1

## 2013-04-08 NOTE — Progress Notes (Signed)
On assessment, pt was lying in bed awake.  Pt states she had a good day.  She reports she is still hearing voices and seeing things that are not real.  She says she went to group tonight.  She plans to return home at discharge when her meds are adjusted.  She says things are just stressful at home.  Pt denies HI, but still having passive thoughts to kill herself.  Pt encouraged to make her needs known to staff.  Support and encouragement offered.  Safety maintained with q15 minute checks.

## 2013-04-08 NOTE — Progress Notes (Signed)
Patient ID: Danielle [name], female   DOB: November 07, 1970, 42 y.o.   MRN: [id number] [facility] MD Progress Note  04/08/2013 11:00 AM Danielle [name]  MRN:  [id number] Subjective:   Patient states "I am frustrated because I'm not seeing any improvement. The only break I get from the voices is when I am sleeping. They are in my head and won't go away. I just want them to stop."   Objective:  Patient continues to endorse symptoms of psychosis and depression. She is assessed today lying in bed with the covers over her head. Patient appears very depressed and anxious. She becomes tearful when speaking about her symptoms. Explained to patient about the length of time it can take a new medication to become effective. Encourage the patient to request Zyprexa Zydis as needed for symptoms and that the Hinda Glatter will be increased starting tomorrow.   Diagnosis:   DSM5: Schizophrenia Disorders: Paranoid schizophrenia  Obsessive-Compulsive Disorders:  Trauma-Stressor Disorders: Posttraumatic Stress Disorder (309.81)  Substance/Addictive Disorders:  Depressive Disorders:  AXIS I: Schizophrenia, paranoid type  AXIS II: Deferred  AXIS III:  Past Medical History   Diagnosis  Date   .  Depressed    .  Seizures    .  Heart murmur    .  Hypothyroidism    .  Anemia    .  Headache(784.0)    .  Anxiety    .  PTSD (post-traumatic stress disorder)    .  Shortness of breath    .  Schizophrenia    .  Bipolar 1 disorder     AXIS IV: problems related to social environment and problems with primary support group  AXIS V: 41-50 serious symptoms  ADL's:  Intact  Sleep: Good  Appetite:  Good  Suicidal Ideation:  Passive SI no plan Homicidal Ideation:  Denies AEB (as evidenced by):  Psychiatric Specialty Exam: Review of Systems  Constitutional: Negative.   HENT: Negative.   Eyes: Negative.   Respiratory: Negative.   Cardiovascular: Negative.   Gastrointestinal: Negative.   Genitourinary: Negative.    Musculoskeletal: Positive for joint pain (Patient has right foot pain from previous fracture. ).  Skin: Negative.   Neurological: Negative.   Endo/Heme/Allergies: Negative.   Psychiatric/Behavioral: Positive for depression, suicidal ideas and hallucinations. Negative for memory loss and substance abuse. The patient is nervous/anxious. The patient does not have insomnia.     Blood pressure 113/82, pulse 99, temperature 97.2 F (36.2 C), temperature source Oral, resp. rate 20, height 5\' 11"  (1.803 m), weight 108.41 kg (239 lb).Body mass index is 33.35 kg/(m^2).  General Appearance: Casual  Eye Contact::  Good  Speech:  Clear and Coherent  Volume:  Normal  Mood:  Anxious and Dysphoric  Affect:  Congruent  Thought Process:  Goal Directed and Intact  Orientation:  Full (Time, Place, and Person)  Thought Content:  Hallucinations: Auditory Visual, Paranoid Ideation and Rumination  Suicidal Thoughts:  Yes.  without intent/plan  Homicidal Thoughts:  No  Memory:  Immediate;   Good Recent;   Good Remote;   Good  Judgement:  Impaired  Insight:  Shallow  Psychomotor Activity:  Decreased  Concentration:  Fair  Recall:  Good  Akathisia:  No  Handed:  Right  AIMS (if indicated):     Assets:  Communication Skills Desire for Improvement Physical Health Resilience Social Support  Sleep:  Number of Hours: 5.75   Current Medications: Current Facility-Administered Medications  Medication Dose Route Frequency Provider Last Rate  Last Dose  . acetaminophen (TYLENOL) tablet 650 mg  650 mg Oral Q6H PRN Kerry Hough, PA-C   650 mg at 04/07/13 0809  . alum & mag hydroxide-simeth (MAALOX/MYLANTA) 200-200-20 MG/5ML suspension 30 mL  30 mL Oral Q4H PRN Kerry Hough, PA-C      . divalproex (DEPAKOTE ER) 24 hr tablet 750 mg  750 mg Oral BH-qamhs Mojeed Akintayo   750 mg at 04/08/13 0803  . lamoTRIgine (LAMICTAL) tablet 200 mg  200 mg Oral QHS Kerry Hough, PA-C   200 mg at 04/07/13 2121  .  levothyroxine (SYNTHROID, LEVOTHROID) tablet 100 mcg  100 mcg Oral QAC breakfast Kerry Hough, PA-C   100 mcg at 04/08/13 1610  . magnesium hydroxide (MILK OF MAGNESIA) suspension 30 mL  30 mL Oral Daily PRN Kerry Hough, PA-C      . naproxen (NAPROSYN) tablet 250 mg  250 mg Oral BID WC Fransisca Kaufmann, NP   250 mg at 04/08/13 0802  . OLANZapine zydis (ZYPREXA) disintegrating tablet 5 mg  5 mg Oral Q8H PRN Fransisca Kaufmann, NP      . Melene Muller ON 04/09/2013] paliperidone (INVEGA) 24 hr tablet 9 mg  9 mg Oral Daily Fransisca Kaufmann, NP      . traZODone (DESYREL) tablet 50 mg  50 mg Oral QHS PRN Mojeed Akintayo      . trihexyphenidyl (ARTANE) tablet 2 mg  2 mg Oral BID WC Mojeed Akintayo   2 mg at 04/08/13 9604    Lab Results:  Results for orders placed during the hospital encounter of 04/06/13 (from the past 48 hour(s))  TSH     Status: None   Collection Time    04/07/13  7:49 PM      Result Value Range   TSH 2.083  0.350 - 4.500 uIU/mL   Comment: Performed at [facility]    Physical Findings: AIMS: Facial and Oral Movements Muscles of Facial Expression: None, normal Lips and Perioral Area: None, normal Jaw: None, normal Tongue: None, normal,Extremity Movements Upper (arms, wrists, hands, fingers): None, normal Lower (legs, knees, ankles, toes): None, normal, Trunk Movements Neck, shoulders, hips: None, normal, Overall Severity Severity of abnormal movements (highest score from questions above): None, normal Incapacitation due to abnormal movements: None, normal, Dental Status Current problems with teeth and/or dentures?: Yes Does patient usually wear dentures?: Yes (pt. does have with her "I lost them")  CIWA:    COWS:     Treatment Plan Summary: Daily contact with patient to assess and evaluate symptoms and progress in treatment Medication management  Plan: Continue crisis management and stabilization.  Medication management: Reviewed with patient who stated no untoward  effects. Continue Depakote 750 mg every am and hs, Lamictal 200 mg hs for improved mood stability.  Increase Invega to 9 mg Daily for psychosis.  Continue Zyprexa Zydis 5 mg every eight hours as need for psychosis.  Encouraged patient to attend groups and participate in group counseling sessions and activities.  Discharge plan in progress.  Continue current treatment plan.  Address health issues: Vitals reviewed and stable. Patient reports decrease in foot pain since being started on Naproxen.   Medical Decision Making Problem Points:  Established problem, stable/improving (1) and Review of psycho-social stressors (1) Data Points:  Review of medication regiment & side effects (2)  I certify that inpatient services furnished can reasonably be expected to improve the patient's condition.   Rawan Riendeau NP-C 04/08/2013, 11:00 AM
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
## 2013-04-08 NOTE — Progress Notes (Signed)
D: Patient has flat, sad affect and depressed mood. She reported on the self inventory sheet that she's sleeping fair, appetite and ability to pay attention are improving and energy level is low. Patient rated depression and feelings of hopelessness "4". Writer observed patient lying in bed most of the day; not attending the majority of groups that are being held today. Patient compliant with medication regimen.  A: Support and encouragement provided to patient. Scheduled medications administered per MD orders. Maintain Q15 minute checks for safety.  R: Patient receptive. Passive SI, but contracts for safety. Denies HI and auditory and visual hallucinations. Patient remains safe.

## 2013-04-08 NOTE — Progress Notes (Signed)
Seen and agreed. Elene Downum, MD 

## 2013-04-08 NOTE — Tx Team (Signed)
  Interdisciplinary Treatment Plan Update   Date Reviewed:  04/08/2013  Time Reviewed:  4:50 PM  Progress in Treatment:   Attending groups: Yes Participating in groups: Yes Taking medication as prescribed: Yes  Tolerating medication: Yes Family/Significant other contact made: Yes  Patient understands diagnosis: Yes  Discussing patient identified problems/goals with staff: Yes Medical problems stabilized or resolved: Yes Denies suicidal/homicidal ideation: Yes Patient has not harmed self or others: Yes  For review of initial/current patient goals, please see plan of care.  Estimated Length of Stay:  3-5 days  Reason for Continuation of Hospitalization: Depression Hallucinations Medication stabilization  New Problems/Goals identified:  N/A  Discharge Plan or Barriers:   return home, follow up outpt  Additional Comments:  Patient states "I am frustrated because I'm not seeing any improvement. The only break I get from the voices is when I am sleeping. They are in my head and won't go away. I just want them to stop."  Patient continues to endorse symptoms of psychosis and depression. She is assessed today lying in bed with the covers over her head. Patient appears very depressed and anxious. She becomes tearful when speaking about her symptoms. Explained to patient about the length of time it can take a new medication to become effective. Encourage the patient to request Zyprexa Zydis as needed for symptoms and that the Hinda Glatter will be increased starting tomorrow.    Attendees:  Signature: Thedore Mins, MD 04/08/2013 4:50 PM   Signature: Richelle Ito, LCSW 04/08/2013 4:50 PM  Signature: Fransisca Kaufmann, NP 04/08/2013 4:50 PM  Signature: Joslyn Devon, RN 04/08/2013 4:50 PM  Signature: Liborio Nixon, RN 04/08/2013 4:50 PM  Signature:  04/08/2013 4:50 PM  Signature:   04/08/2013 4:50 PM  Signature:    Signature:    Signature:    Signature:    Signature:    Signature:      Scribe for  Treatment Team:   Richelle Ito, LCSW  04/08/2013 4:50 PM

## 2013-04-09 MED ORDER — TRIHEXYPHENIDYL HCL 5 MG PO TABS
5.0000 mg | ORAL_TABLET | Freq: Every day | ORAL | Status: DC
  Administered 2013-04-10 – 2013-04-13 (×4): 5 mg via ORAL
  Filled 2013-04-09 (×6): qty 1

## 2013-04-09 MED ORDER — OLANZAPINE 10 MG PO TBDP
10.0000 mg | ORAL_TABLET | Freq: Three times a day (TID) | ORAL | Status: DC | PRN
  Administered 2013-04-09 – 2013-04-12 (×5): 10 mg via ORAL
  Filled 2013-04-09 (×5): qty 1

## 2013-04-09 MED ORDER — TRAZODONE HCL 150 MG PO TABS
150.0000 mg | ORAL_TABLET | Freq: Every day | ORAL | Status: DC
  Administered 2013-04-09 – 2013-04-15 (×7): 150 mg via ORAL
  Filled 2013-04-09 (×3): qty 1
  Filled 2013-04-09: qty 3
  Filled 2013-04-09 (×6): qty 1

## 2013-04-09 MED ORDER — PALIPERIDONE ER 6 MG PO TB24
12.0000 mg | ORAL_TABLET | Freq: Every day | ORAL | Status: DC
  Administered 2013-04-10 – 2013-04-13 (×4): 12 mg via ORAL
  Filled 2013-04-09 (×6): qty 2

## 2013-04-09 NOTE — [facility] Group Notes (Signed)
[facility] LCSW Aftercare Discharge Planning Group Note   04/09/2013 11:02 AM  Participation Quality:  Did not attend    [place]

## 2013-04-09 NOTE — Progress Notes (Signed)
Patient ID: Danielle [name], female   DOB: 1970/08/22, 42 y.o.   MRN: [id number] [facility] MD Progress Note  04/09/2013 10:43 AM Danielle [name]  MRN:  [id number] Subjective:   "I am feeling overwhelmed and frustrated because I am not getting better, I am still hearing voices and seeing things."  Objective: Patient appears anxious, flat and depressed. She continues to reports seeing things that are not there and hearing voices making derogatory remarks about her. She is frustrated that her medications have not helped her as fast as she had anticipated. However, she remains complaint with her medications and has not endorsed any adverse reactions. She has been attending groups and other unit activities.  Diagnosis:   DSM5: Schizophrenia Disorders: Paranoid schizophrenia  Obsessive-Compulsive Disorders:  Trauma-Stressor Disorders: Posttraumatic Stress Disorder (309.81)  Substance/Addictive Disorders:  Depressive Disorders:  AXIS I: Schizophrenia, paranoid type  AXIS II: Deferred  AXIS III:  Past Medical History   Diagnosis  Date   .  Depressed    .  Seizures    .  Heart murmur    .  Hypothyroidism    .  Anemia    .  Headache(784.0)    .  Anxiety    .  PTSD (post-traumatic stress disorder)    .  Shortness of breath    .  Schizophrenia    .  Bipolar 1 disorder     AXIS IV: problems related to social environment and problems with primary support group  AXIS V: 41-50 serious symptoms  ADL's:  Intact  Sleep: fair  Appetite:  Good  Suicidal Ideation:  Passive SI no plan Homicidal Ideation:  Denies AEB (as evidenced by):  Psychiatric Specialty Exam: Review of Systems  Constitutional: Negative.   HENT: Negative.   Eyes: Negative.   Respiratory: Negative.   Cardiovascular: Negative.   Gastrointestinal: Negative.   Genitourinary: Negative.   Musculoskeletal: Positive for joint pain (Patient has right foot pain from previous fracture. ).  Skin: Negative.   Neurological: Negative.    Endo/Heme/Allergies: Negative.   Psychiatric/Behavioral: Positive for depression, suicidal ideas and hallucinations. Negative for memory loss and substance abuse. The patient is nervous/anxious. The patient does not have insomnia.     Blood pressure 108/77, pulse 112, temperature 98.1 F (36.7 C), temperature source Oral, resp. rate 20, height 5\' 11"  (1.803 m), weight 108.41 kg (239 lb).Body mass index is 33.35 kg/(m^2).  General Appearance: Casual  Eye Contact::  Good  Speech:  Clear and Coherent  Volume:  Normal  Mood:  Anxious and Dysphoric  Affect:  Congruent  Thought Process:  Goal Directed and Intact  Orientation:  Full (Time, Place, and Person)  Thought Content:  Hallucinations: Auditory Visual, Paranoid Ideation and Rumination  Suicidal Thoughts:  Yes.  without intent/plan  Homicidal Thoughts:  No  Memory:  Immediate;   Good Recent;   Good Remote;   Good  Judgement:  Impaired  Insight:  Shallow  Psychomotor Activity:  Decreased  Concentration:  Fair  Recall:  Good  Akathisia:  No  Handed:  Right  AIMS (if indicated):     Assets:  Communication Skills Desire for Improvement Physical Health Resilience Social Support  Sleep:  Number of Hours: 5.25   Current Medications: Current Facility-Administered Medications  Medication Dose Route Frequency Provider Last Rate Last Dose  . acetaminophen (TYLENOL) tablet 650 mg  650 mg Oral Q6H PRN Kerry Hough, PA-C   650 mg at 04/07/13 0809  . alum & mag hydroxide-simeth (  MAALOX/MYLANTA) 200-200-20 MG/5ML suspension 30 mL  30 mL Oral Q4H PRN Kerry Hough, PA-C      . divalproex (DEPAKOTE ER) 24 hr tablet 750 mg  750 mg Oral BH-qamhs Zayra Devito   750 mg at 04/09/13 0821  . lamoTRIgine (LAMICTAL) tablet 200 mg  200 mg Oral QHS Kerry Hough, PA-C   200 mg at 04/08/13 2137  . levothyroxine (SYNTHROID, LEVOTHROID) tablet 100 mcg  100 mcg Oral QAC breakfast Kerry Hough, PA-C   100 mcg at 04/09/13 0636  . magnesium  hydroxide (MILK OF MAGNESIA) suspension 30 mL  30 mL Oral Daily PRN Kerry Hough, PA-C      . naproxen (NAPROSYN) tablet 250 mg  250 mg Oral BID WC Fransisca Kaufmann, NP   250 mg at 04/09/13 8295  . OLANZapine zydis (ZYPREXA) disintegrating tablet 10 mg  10 mg Oral Q8H PRN Madden Piazza      . [START ON 04/10/2013] paliperidone (INVEGA) 24 hr tablet 12 mg  12 mg Oral Daily Carlyle Mcelrath      . traZODone (DESYREL) tablet 150 mg  150 mg Oral QHS Simuel Stebner      . [START ON 04/10/2013] trihexyphenidyl (ARTANE) tablet 5 mg  5 mg Oral Daily Canon Gola        Lab Results:  Results for orders placed during the hospital encounter of 04/06/13 (from the past 48 hour(s))  TSH     Status: None   Collection Time    04/07/13  7:49 PM      Result Value Range   TSH 2.083  0.350 - 4.500 uIU/mL   Comment: Performed at [facility]    Physical Findings: AIMS: Facial and Oral Movements Muscles of Facial Expression: None, normal Lips and Perioral Area: None, normal Jaw: None, normal Tongue: None, normal,Extremity Movements Upper (arms, wrists, hands, fingers): None, normal Lower (legs, knees, ankles, toes): None, normal, Trunk Movements Neck, shoulders, hips: None, normal, Overall Severity Severity of abnormal movements (highest score from questions above): None, normal Incapacitation due to abnormal movements: None, normal, Dental Status Current problems with teeth and/or dentures?: Yes Does patient usually wear dentures?: Yes (pt. does have with her "I lost them")  CIWA:    COWS:     Treatment Plan Summary: Daily contact with patient to assess and evaluate symptoms and progress in treatment Medication management  Plan: Continue crisis management and stabilization.  Medication management: Reviewed with patient who stated no untoward effects. Continue Depakote 750 mg every am and hs, Lamictal 200 mg hs for improved mood stability.  Increase Invega to 12 mg Daily for psychosis.   Continue Zyprexa Zydis 10 mg every eight hours as need for psychosis.  Encouraged patient to attend groups and participate in group counseling sessions and activities.  Discharge plan in progress.  Continue current treatment plan.  Address health issues: Vitals reviewed and stable. Patient reports decrease in foot pain since being started on Naproxen.  Valproic acid level on 04/13/11.  Medical Decision Making Problem Points:  Established problem,  improving (1) and Review of psycho-social stressors (1) Data Points:  Review of medication regiment & side effects (2)  I certify that inpatient services furnished can reasonably be expected to improve the patient's condition.   Thedore Mins, MD 04/09/2013, 10:43 AM

## 2013-04-09 NOTE — [facility] Group Notes (Signed)
[facility] LCSW Group Therapy  04/09/2013  1:05 PM  Type of Therapy:  Group therapy  Participation Level:  Active  Participation Quality:  Attentive  Affect:  Flat  Cognitive:  Oriented  Insight:  Limited  Engagement in Therapy:  Limited  Modes of Intervention:  Discussion, Socialization  Summary of Progress/Problems:  Chaplain was here to lead a group on themes of hope and courage. Danielle [name] stated that her hope comes from certain family members.  Some only love her conditionally, but others no matter what.  Also, she appreciates being part of this group because "we are all here for each other."   Both gave and received feedback to/from other group members.  Despite her insistence that she remains symptomatic, she is engaged, laughing and in a good mood when she is in group. [name] B 04/09/2013 1:37 PM

## 2013-04-09 NOTE — [facility] Suicide Risk Assessment (Signed)
[facility] INPATIENT:  Family/Significant Other Suicide Prevention Education  Suicide Prevention Education:  Education Completed; Husband,  Semira Stoltzfus [phone] has been identified by the patient as the family member/significant other with whom the patient will be residing, and identified as the person(s) who will aid the patient in the event of a mental health crisis (suicidal ideations/suicide attempt).  With written consent from the patient, the family member/significant other has been provided the following suicide prevention education, prior to the and/or following the discharge of the patient.  The suicide prevention education provided includes the following:  Suicide risk factors  Suicide prevention and interventions  National Suicide Hotline telephone number  [facility] assessment telephone number  [facility] Emergency Assistance 911  [facility] and/or Residential Mobile Crisis Unit telephone number  Request made of family/significant other to:  Remove weapons (e.g., guns, rifles, knives), all items previously/currently identified as safety concern.    Remove drugs/medications (over-the-counter, prescriptions, illicit drugs), all items previously/currently identified as a safety concern.  The family member/significant other verbalizes understanding of the suicide prevention education information provided.  The family member/significant other agrees to remove the items of safety concern listed above.  [name] 04/09/2013, 11:35 AM

## 2013-04-09 NOTE — Progress Notes (Signed)
Patient ID: GENAVIE BOETTGER, female   DOB: 1970/06/06, 42 y.o.   MRN: [id number] D. The patient has an anxious mood and affect. Although she was sitting in the dayroom watching TV most of the evening, she participated in very little interaction with the milieu. Voices several somatic complaints. Very focused on her insomnia. A. Met with patient 1:1. Encourage to attend evening group. Administered HS medication. R. Stated she is stilling experiencing auditory hallucinations. Upset that the doctor didn't write an order for more sleep medication. Will continue to monitor sleep.

## 2013-04-09 NOTE — Progress Notes (Signed)
[facility] Group Notes:  (Nursing/MHT/Case Management/Adjunct)  Date:  04/09/2013  Time:  8:00 p.m.   Type of Therapy:  Psychoeducational Skills  Participation Level:  Active  Participation Quality:  Appropriate  Affect:  Depressed  Cognitive:  Appropriate  Insight:  Good  Engagement in Group:  Developing/Improving  Modes of Intervention:  Education  Summary of Progress/Problems: The patient stated in group that she had a "so so" day. When asked to explain a little bit further, the patient indicated that she was concerned that the medication does not seem to be working (still hallucinating). She did however mention that she had a good visit with her husband. As a theme for the day, she verbalized that she intends to attend some "programming" (groups) as a relapse prevention.   Danielle [name] 04/09/2013, 9:25 PM

## 2013-04-09 NOTE — Progress Notes (Signed)
D   Pt did attend [place] group this evening   She said it was ok  Her speech is pressured and she is anxious and tense   She is compliant with treatment   She denies self harm thoughts at present A   Verbal support and encouragement given  Medications administered and effectiveness monitored   Q 15 min checks R   Pt safe at present

## 2013-04-09 NOTE — Progress Notes (Signed)
D: Patient appropriate and cooperative with staff; continue to present with flat/sad affect and depressed mood. Patient complained of agitation. She's attending some groups, but not all. Patient voiced that she really doesn't feel any better than yesterday; she reported not sleeping well at night.  A: Support and encouragement provided to patient. Administered scheduled medications per ordering MD. PRN Zyprexa given for agitation. Monitor Q15 minute checks for safety.  R: Patient receptive. Endorses passive SI and auditory hallucinations, but contracts for safety. Denies HI and visual hallucinations.

## 2013-04-10 DIAGNOSIS — F4310 Post-traumatic stress disorder, unspecified: Secondary | ICD-10-CM

## 2013-04-10 DIAGNOSIS — F200 Paranoid schizophrenia: Principal | ICD-10-CM

## 2013-04-10 NOTE — Progress Notes (Signed)
Patient ID: Danielle [name], female   DOB: [birth year], 42 y.o.   MRN: [id number] D. The patient has an anxious mood and affect. States that she is still hearing voices. Isolates in her room with very little interaction in the milieu. Her speech is difficult to understand, because she is not wearing her dentures. A. Met with patient 1:1. Encouraged to attend evening wrap up group. Reviewed and administered HS medications. R. The patient attended and participated in evening wrap up group. She sat with her hands over her ears throughout the group to block out the voices. Stated that her support system is her husband and her out patient group that she attends.

## 2013-04-10 NOTE — Progress Notes (Signed)
Patient ID: Danielle [name], female   DOB: 1970/10/14, 42 y.o.   MRN: [id number]  [facility] MD Progress Note  04/10/2013 10:28 AM JONESHA TSUCHIYA  MRN:  [id number] Subjective:   Less frustrated but continues to hear voices. They are not commanding. Paranoid around people.   Objective: Patient has a depressed affect. Appears not agitated today. Medication invega was increased yesterday.  Diagnosis:   DSM5: Schizophrenia Disorders: Paranoid schizophrenia  Obsessive-Compulsive Disorders:  Trauma-Stressor Disorders: Posttraumatic Stress Disorder (309.81)  Substance/Addictive Disorders:  Depressive Disorders:  AXIS I: Schizophrenia, paranoid type  AXIS II: Deferred  AXIS III:  Past Medical History   Diagnosis  Date   .  Depressed    .  Seizures    .  Heart murmur    .  Hypothyroidism    .  Anemia    .  Headache(784.0)    .  Anxiety    .  PTSD (post-traumatic stress disorder)    .  Shortness of breath    .  Schizophrenia    .  Bipolar 1 disorder     AXIS IV: problems related to social environment and problems with primary support group  AXIS V: 41-50 serious symptoms  ADL's:  Intact  Sleep: fair  Appetite:  Good  Suicidal Ideation:  Passive SI no plan Homicidal Ideation:  Denies AEB (as evidenced by):  Psychiatric Specialty Exam: Review of Systems  Constitutional: Negative.   HENT: Negative.   Eyes: Negative.   Respiratory: Negative.   Cardiovascular: Negative.   Gastrointestinal: Negative.   Genitourinary: Negative.   Musculoskeletal: Positive for joint pain (Patient has right foot pain from previous fracture. ).  Skin: Negative.   Neurological: Negative.   Endo/Heme/Allergies: Negative.   Psychiatric/Behavioral: Positive for depression, suicidal ideas and hallucinations. Negative for memory loss and substance abuse. The patient is nervous/anxious. The patient does not have insomnia.     Blood pressure 101/71, pulse 114, temperature 97.2 F (36.2 C), temperature  source Oral, resp. rate 18, height 5\' 11"  (1.803 m), weight 108.41 kg (239 lb).Body mass index is 33.35 kg/(m^2).  General Appearance: Casual  Eye Contact::  Good  Speech:  Clear and Coherent  Volume:  Normal  Mood:  Anxious and Dysphoric  Affect:  Congruent  Thought Process:  Goal Directed and Intact  Orientation:  Full (Time, Place, and Person)  Thought Content:  Hallucinations: Auditory Visual, Paranoid Ideation and Rumination  Suicidal Thoughts:  Yes.  without intent/plan  Homicidal Thoughts:  No  Memory:  Immediate;   Good Recent;   Good Remote;   Good  Judgement:  Impaired  Insight:  Shallow  Psychomotor Activity:  Decreased  Concentration:  Fair  Recall:  Good  Akathisia:  No  Handed:  Right  AIMS (if indicated):     Assets:  Communication Skills Desire for Improvement Physical Health Resilience Social Support  Sleep:  Number of Hours: 6.25   Current Medications: Current Facility-Administered Medications  Medication Dose Route Frequency Provider Last Rate Last Dose  . acetaminophen (TYLENOL) tablet 650 mg  650 mg Oral Q6H PRN Kerry Hough, PA-C   650 mg at 04/07/13 0809  . alum & mag hydroxide-simeth (MAALOX/MYLANTA) 200-200-20 MG/5ML suspension 30 mL  30 mL Oral Q4H PRN Kerry Hough, PA-C      . divalproex (DEPAKOTE ER) 24 hr tablet 750 mg  750 mg Oral BH-qamhs Mojeed Akintayo   750 mg at 04/10/13 0827  . lamoTRIgine (LAMICTAL) tablet 200 mg  200  mg Oral QHS Kerry Hough, PA-C   200 mg at 04/09/13 2130  . levothyroxine (SYNTHROID, LEVOTHROID) tablet 100 mcg  100 mcg Oral QAC breakfast Kerry Hough, PA-C   100 mcg at 04/10/13 9604  . magnesium hydroxide (MILK OF MAGNESIA) suspension 30 mL  30 mL Oral Daily PRN Kerry Hough, PA-C      . naproxen (NAPROSYN) tablet 250 mg  250 mg Oral BID WC Fransisca Kaufmann, NP   250 mg at 04/10/13 0827  . OLANZapine zydis (ZYPREXA) disintegrating tablet 10 mg  10 mg Oral Q8H PRN Mojeed Akintayo   10 mg at 04/10/13 0004  .  paliperidone (INVEGA) 24 hr tablet 12 mg  12 mg Oral Daily Mojeed Akintayo   12 mg at 04/10/13 0827  . traZODone (DESYREL) tablet 150 mg  150 mg Oral QHS Mojeed Akintayo   150 mg at 04/09/13 2130  . trihexyphenidyl (ARTANE) tablet 5 mg  5 mg Oral Daily Mojeed Akintayo   5 mg at 04/10/13 5409    Lab Results:  No results found for this or any previous visit (from the past 48 hour(s)).  Physical Findings: AIMS: Facial and Oral Movements Muscles of Facial Expression: None, normal Lips and Perioral Area: None, normal Jaw: None, normal Tongue: None, normal,Extremity Movements Upper (arms, wrists, hands, fingers): None, normal Lower (legs, knees, ankles, toes): None, normal, Trunk Movements Neck, shoulders, hips: None, normal, Overall Severity Severity of abnormal movements (highest score from questions above): None, normal Incapacitation due to abnormal movements: None, normal, Dental Status Current problems with teeth and/or dentures?: Yes Does patient usually wear dentures?: Yes (pt. does have with her "I lost them")  CIWA:    COWS:     Treatment Plan Summary: Daily contact with patient to assess and evaluate symptoms and progress in treatment Medication management  Plan: Continue crisis management and stabilization.  Medication management: Reviewed with patient who stated no untoward effects. Continue Depakote 750 mg every am and hs, Lamictal 200 mg hs for improved mood stability.  Continue Invega to 12 mg Daily for psychosis.  Continue Zyprexa Zydis 10 mg every eight hours as need for psychosis.  Encouraged patient to attend groups and participate in group counseling sessions and activities.  Some improvement since increased invega.    Valproic acid level on 04/13/11.  Medical Decision Making Problem Points:  Established problem,  improving (1) and Review of psycho-social stressors (1) Data Points:  Review of medication regiment & side effects (2)  I certify that inpatient  services furnished can reasonably be expected to improve the patient's condition.   Thresa Ross, MD 04/10/2013, 10:28 AM

## 2013-04-10 NOTE — Progress Notes (Signed)
Patient ID: Danielle [name], female   DOB: [birth year], 42 y.o.   MRN: [id number] 04-10-2013 nursing shift note: D: pt stated she was having some anxiety/agitation. She was having some auditory hallucinations. She denied any si/hi. A: staff encouraged and supported this patient.R: on her inventory sheet she wrote; requested sleep medication, appetite improving, energy low, attention improving with her depression and hopelessness both at 5. Denies any suicide ideation and hurting himself.  Physical problems in the last 24 hrs she had lightheaded, dizziness and headaches.  RN will monitor and Q 15 min ck's continue.

## 2013-04-10 NOTE — [facility] Group Notes (Signed)
[facility] Group Notes:  (Clinical Social Work)  04/10/2013  11:15-11:45AM  Summary of Progress/Problems:   The main focus of today's process group was for the patient to identify ways in which they have in the past sabotaged their own recovery and reasons they may have done this/what they received from doing it.  We then worked to identify a specific plan to avoid doing this when discharged from the hospital for this admission.  The patient expressed that she is here with auditory/visual hallucinations and suicidal thoughts.  She wants to be able to return home and help her husband around the house, as she cannot do so right now with a broken leg.  She is very happy and settled with her psychiatrist and therapist downstairs at [facility] and wants to return to their services.  Type of Therapy:  Group Therapy - Process  Participation Level:  Active  Participation Quality:  Attentive and Sharing  Affect:  Flat  Cognitive:  Appropriate and Oriented  Insight:  Engaged  Engagement in Therapy:  Engaged  Modes of Intervention:  Clarification, Education, Exploration, Discussion  Ambrose Mantle, LCSW 04/10/2013, 12:59 PM

## 2013-04-10 NOTE — Progress Notes (Signed)
Patient ID: Danielle [name], female   DOB: [birth year], 42 y.o.   MRN: [id number] Psychoeducational Group Note  Date:  04/10/2013 Time:0930am  Group Topic/Focus:  Identifying Needs:   The focus of this group is to help patients identify their personal needs that have been historically problematic and identify healthy behaviors to address their needs.  Participation Level:  Active  Participation Quality:  Appropriate  Affect:  Blunted  Cognitive:  Appropriate  Insight:  Supportive  Engagement in Group:  Supportive  Additional Comments:  Inventory and Psychoeducational group   Valente David 04/10/2013,10:14 AM

## 2013-04-10 NOTE — Progress Notes (Signed)
[facility] Group Notes:  (Nursing/MHT/Case Management/Adjunct)  Date:  04/10/2013  Time:  8:00 p.m.   Type of Therapy:  Psychoeducational Skills  Participation Level:  Active  Participation Quality:  Appropriate  Affect:  Flat  Cognitive:  Appropriate  Insight:  Improving  Engagement in Group:  Improving  Modes of Intervention:  Education  Summary of Progress/Problems: The patient expressed in group this evening that her day went well despite one disappointment. The patient shared with the group that the voices and hallucinations decreased in frequency today. On the other hand, she indicated that her husband did not come in to visit her today. . In terms of the theme for the day, she mentioned that she intends to attend her outpatient groups as a coping skill.   Hazle Coca S 04/10/2013, 10:36 PM

## 2013-04-11 NOTE — [facility] Group Notes (Signed)
[facility] Group Notes:  (Clinical Social Work)  04/11/2013   11:15am-12:00pm  Summary of Progress/Problems:  The main focus of today's process group was to listen to a variety of genres of music and to identify that different types of music provoke different responses.  The patient then was able to identify personally what was soothing for them, as well as energizing.  Handouts were used to record feelings evoked, as well as how patient can personally use this knowledge in sleep habits, with depression, and with other symptoms.  The patient expressed understanding of concepts, as well as knowledge of how each type of music affected them and how this can be used when they are at home as a tool in their recovery.  Tashia was late to group, so only got to listen to last 4 songs.  Type of Therapy:  Music Therapy   Participation Level:  Active  Participation Quality:  Attentive and Sharing  Affect:  Blunted  Cognitive:  Oriented  Insight:  Limited  Engagement in Therapy: Improving  Modes of Intervention:   Activity, Exploration  Ambrose Mantle, LCSW 04/11/2013, 12:30pm

## 2013-04-11 NOTE — Progress Notes (Signed)
Patient ID: [name], female   DOB: [birth year], 42 y.o.   MRN: [id number]  [facility] MD Progress Note  04/11/2013 10:28 AM Danielle [name]  MRN:  [id number] Subjective:   Lying in bed feels isolative and depressed but not suicidal. Endorses paranoia  Objective: Patient has a depressed affect. Appears not agitated today. Medication invega was increased yesterday.  Diagnosis:   DSM5: Schizophrenia Disorders: Paranoid schizophrenia  Obsessive-Compulsive Disorders:  Trauma-Stressor Disorders: Posttraumatic Stress Disorder (309.81)  Substance/Addictive Disorders:  Depressive Disorders:  AXIS I: Schizophrenia, paranoid type  AXIS II: Deferred  AXIS III:  Past Medical History   Diagnosis  Date   .  Depressed    .  Seizures    .  Heart murmur    .  Hypothyroidism    .  Anemia    .  Headache(784.0)    .  Anxiety    .  PTSD (post-traumatic stress disorder)    .  Shortness of breath    .  Schizophrenia    .  Bipolar 1 disorder     AXIS IV: problems related to social environment and problems with primary support group  AXIS V: 41-50 serious symptoms  ADL's:  Intact  Sleep: fair  Appetite:  Good  Suicidal Ideation:  Passive SI no plan Homicidal Ideation:  Denies AEB (as evidenced by):  Psychiatric Specialty Exam: Review of Systems  Constitutional: Negative.   HENT: Negative.   Eyes: Negative.   Respiratory: Negative.   Cardiovascular: Negative.   Gastrointestinal: Negative.   Genitourinary: Negative.   Musculoskeletal: Positive for joint pain (Patient has right foot pain from previous fracture. ).  Skin: Negative.   Neurological: Negative.   Endo/Heme/Allergies: Negative.   Psychiatric/Behavioral: Positive for depression, suicidal ideas and hallucinations. Negative for memory loss and substance abuse. The patient is nervous/anxious. The patient does not have insomnia.     Blood pressure 111/79, pulse 90, temperature 97.3 F (36.3 C), temperature source Oral, resp. rate  18, height 5\' 11"  (1.803 m), weight 108.41 kg (239 lb).Body mass index is 33.35 kg/(m^2).  General Appearance: Casual  Eye Contact::  Good  Speech:  Clear and Coherent  Volume:  Normal  Mood:  Anxious and Dysphoric  Affect:  Congruent  Thought Process:  Goal Directed and Intact  Orientation:  Full (Time, Place, and Person)  Thought Content:  Hallucinations: Auditory Visual, Paranoid Ideation and Rumination  Suicidal Thoughts:  Yes.  without intent/plan  Homicidal Thoughts:  No  Memory:  Immediate;   Good Recent;   Good Remote;   Good  Judgement:  Impaired  Insight:  Shallow  Psychomotor Activity:  Decreased  Concentration:  Fair  Recall:  Good  Akathisia:  No  Handed:  Right  AIMS (if indicated):     Assets:  Communication Skills Desire for Improvement Physical Health Resilience Social Support  Sleep:  Number of Hours: 5.5   Current Medications: Current Facility-Administered Medications  Medication Dose Route Frequency Provider Last Rate Last Dose  . acetaminophen (TYLENOL) tablet 650 mg  650 mg Oral Q6H PRN Kerry Hough, PA-C   650 mg at 04/07/13 0809  . alum & mag hydroxide-simeth (MAALOX/MYLANTA) 200-200-20 MG/5ML suspension 30 mL  30 mL Oral Q4H PRN Kerry Hough, PA-C      . divalproex (DEPAKOTE ER) 24 hr tablet 750 mg  750 mg Oral BH-qamhs Mojeed Akintayo   750 mg at 04/11/13 0817  . lamoTRIgine (LAMICTAL) tablet 200 mg  200 mg Oral QHS  Kerry Hough, PA-C   200 mg at 04/10/13 2127  . levothyroxine (SYNTHROID, LEVOTHROID) tablet 100 mcg  100 mcg Oral QAC breakfast Kerry Hough, PA-C   100 mcg at 04/11/13 1610  . magnesium hydroxide (MILK OF MAGNESIA) suspension 30 mL  30 mL Oral Daily PRN Kerry Hough, PA-C      . naproxen (NAPROSYN) tablet 250 mg  250 mg Oral BID WC Fransisca Kaufmann, NP   250 mg at 04/11/13 0817  . OLANZapine zydis (ZYPREXA) disintegrating tablet 10 mg  10 mg Oral Q8H PRN Mojeed Akintayo   10 mg at 04/11/13 0817  . paliperidone (INVEGA) 24 hr  tablet 12 mg  12 mg Oral Daily Mojeed Akintayo   12 mg at 04/11/13 0817  . traZODone (DESYREL) tablet 150 mg  150 mg Oral QHS Mojeed Akintayo   150 mg at 04/10/13 2127  . trihexyphenidyl (ARTANE) tablet 5 mg  5 mg Oral Daily Mojeed Akintayo   5 mg at 04/11/13 9604    Lab Results:  No results found for this or any previous visit (from the past 48 hour(s)).  Physical Findings: AIMS: Facial and Oral Movements Muscles of Facial Expression: None, normal Lips and Perioral Area: None, normal Jaw: None, normal Tongue: None, normal,Extremity Movements Upper (arms, wrists, hands, fingers): None, normal Lower (legs, knees, ankles, toes): None, normal, Trunk Movements Neck, shoulders, hips: None, normal, Overall Severity Severity of abnormal movements (highest score from questions above): None, normal Incapacitation due to abnormal movements: None, normal, Dental Status Current problems with teeth and/or dentures?: Yes Does patient usually wear dentures?: Yes (pt. does have with her "I lost them")  CIWA:    COWS:     Treatment Plan Summary: Daily contact with patient to assess and evaluate symptoms and progress in treatment Medication management  Plan: Continue crisis management and stabilization.  Medication management: Reviewed with patient who stated no untoward effects. Continue Depakote 750 mg every am and hs, Lamictal 200 mg hs for improved mood stability.  Continue Invega to 12 mg Daily for psychosis.  Continue Zyprexa Zydis 10 mg every eight hours as need for psychosis.  Encouraged patient to attend groups and participate in group counseling sessions and activities.  Some improvement since increased invega.    Valproic acid level on 04/13/11.  Medical Decision Making Problem Points:  Established problem,  improving (1) and Review of psycho-social stressors (1) Data Points:  Review of medication regiment & side effects (2)  I certify that inpatient services furnished can  reasonably be expected to improve the patient's condition.   Thresa Ross, MD 04/11/2013, 10:28 AM

## 2013-04-11 NOTE — Progress Notes (Signed)
Patient ID: Danielle [name], female   DOB: May 21, 1970, 41 y.o.   MRN: [id number] Psychoeducational Group Note  Date:  04/11/2013 Time:  0930am  Group Topic/Focus:  Making Healthy Choices:   The focus of this group is to help patients identify negative/unhealthy choices they were using prior to admission and identify positive/healthier coping strategies to replace them upon discharge.  Participation Level:  Did Not Attend  Participation Quality:    Affect:  Cognitive: Insight: Engagement in Group:  Additional Comments:  Inventory and Psychoeducational group- healthy support systems   Valente David 04/11/2013,10:25 AM

## 2013-04-11 NOTE — Progress Notes (Signed)
[facility] Group Notes:  (Nursing/MHT/Case Management/Adjunct)  Date:  04/11/2013  Time:  8:00 p.m.   Type of Therapy:  Psychoeducational Skills  Participation Level:  Minimal  Participation Quality:  Attentive  Affect:  Depressed  Cognitive:  Appropriate  Insight:  Improving  Engagement in Group:  Resistant  Modes of Intervention:  Education  Summary of Progress/Problems: The patient shared in group this evening that she had a good day. The patient indicated that the medication helped to "take the edge off" of her symptoms. In addition, she mentioned that she had a good visit with her husband.   Hazle Coca S 04/11/2013, 9:25 PM

## 2013-04-11 NOTE — Progress Notes (Signed)
Patient ID: Danielle [name], female   DOB: [birth year], 42 y.o.   MRN: [id number] 04-11-13 nursing shift note: D: pt has been in bed most of the day. She remains a fall risk and has a wheelchair in her room. RN has been taking medications to her room.she did not fill out an inventory sheet. She remains isolative and depressed.  A: staff continues to acknowledge, support and encourage. She was administered a Zyprexa prn this am for agitation/anxiety.   R: she denies any is/hi. RN will monitor and Q 15 min ck's continue.

## 2013-04-11 NOTE — Progress Notes (Signed)
Met with pt 1:1 who reports having a low energy day. "I assume I'm getting adjusted to the meds." Pt walking with slight limp due to booted R foot. Pain is only a 2/10. She continues to endorse voices but states they are not command in nature. Still reports seeing shadow people.  "It's more just constant back and forth - like chatter." Pt supported, encouraged. Fall precautions reviewed. Medicated per orders without difficulty. Pt verbalizes understanding. Denies SI/HI and remains safe. Lawrence [place]

## 2013-04-12 ENCOUNTER — Encounter (HOSPITAL_COMMUNITY): Payer: Self-pay | Admitting: Emergency Medicine

## 2013-04-12 LAB — CBC
HCT: 36.7 % (ref 36.0–46.0)
Hemoglobin: 12.4 g/dL (ref 12.0–15.0)
MCH: 32.6 pg (ref 26.0–34.0)
MCHC: 33.8 g/dL (ref 30.0–36.0)
MCV: 96.6 fL (ref 78.0–100.0)
Platelets: 290 10*3/uL (ref 150–400)
RBC: 3.8 MIL/uL — ABNORMAL LOW (ref 3.87–5.11)
RDW: 12.6 % (ref 11.5–15.5)
WBC: 6.7 10*3/uL (ref 4.0–10.5)

## 2013-04-12 LAB — COMPREHENSIVE METABOLIC PANEL
ALT: 7 U/L (ref 0–35)
BUN: 11 mg/dL (ref 6–23)
Calcium: 9.1 mg/dL (ref 8.4–10.5)
Chloride: 102 mEq/L (ref 96–112)
GFR calc Af Amer: >90 mL/min (ref >90)
Glucose, Bld: 124 mg/dL — ABNORMAL HIGH (ref 70–99)
Sodium: 137 mEq/L (ref 135–145)
Total Protein: 6.9 g/dL (ref 6.0–8.3)

## 2013-04-12 MED ORDER — FLUOXETINE HCL 20 MG PO CAPS
20.0000 mg | ORAL_CAPSULE | Freq: Every day | ORAL | Status: DC
  Administered 2013-04-12 – 2013-04-14 (×3): 20 mg via ORAL
  Filled 2013-04-12 (×6): qty 1

## 2013-04-12 MED ORDER — OLANZAPINE 5 MG PO TBDP
5.0000 mg | ORAL_TABLET | Freq: Three times a day (TID) | ORAL | Status: DC | PRN
  Administered 2013-04-15 – 2013-04-16 (×2): 5 mg via ORAL
  Filled 2013-04-12 (×4): qty 1

## 2013-04-12 NOTE — ED Notes (Signed)
Pt transferred from [facility] for c/o tachycardia. Found to be in SVT HR 178. Pt performed vagal maneuver and converted to HR 89. No chest pain, mild shortness of breath. Pt has right broken ankle. No hx of SVT. States tachycardia began yesterday afternoon.

## 2013-04-12 NOTE — ED Provider Notes (Signed)
CSN: [id number]     Arrival date & time 04/12/13  1435 History   None    Chief Complaint  Patient presents with  . Tachycardia   (Consider location/radiation/quality/duration/timing/severity/associated sxs/prior Treatment) HPI [name] is a 42 y.o. female w/ PMHx of Depression, Anxiety, Schizophrenia, Bipolar I, Hypothyroidism, chronic anemia, and seizures, presents to the ED from behavioral health w/ tachycardia into the 170's and mild hypotension. The patient claims that she has been feeling dizzy and lightheaded on and off today with episodic palpitations. She says she has had these feelings for some time, lasting 5-10 minutes at a time and feels that they may be associated with anxiety and panic attacks. The patient does not have any signs or symptoms of infection. She has mild SOB w/ exertion, but otherwise denies cough, diarrhea, dysuria, fever, or chills.  The patient was recently seen in the [facility] ED for suicidal ideations and was admitted to [facility] for management of her psychiatric issues. In discussion with the patient today, she denies suicidal or homicidal ideations and currently denies visual or auditory hallucinations, although she says she does have them almost daily. She claims that she is still very depressed, but feels better since being admitted to behavioral health.   Past Medical History  Diagnosis Date  . Depressed   . Seizures   . Heart murmur   . Hypothyroidism   . Anemia   . Headache(784.0)   . Anxiety   . PTSD (post-traumatic stress disorder)   . Shortness of breath   . Schizophrenia   . Bipolar 1 disorder    Past Surgical History  Procedure Laterality Date  . Tubal ligation  1996  . Foot surgery     Family History  Problem Relation Age of Onset  . Adopted: Yes   History  Substance Use Topics  . Smoking status: Former Smoker -- 1.50 packs/day for 30 years    Types: Cigarettes    Quit date: 09/20/2011  . Smokeless tobacco: Never Used   . Alcohol Use: No   OB History   Grav Para Term Preterm Abortions TAB SAB Ect Mult Living                 Review of Systems General: Denies fever, chills, diaphoresis, appetite change and fatigue.  Respiratory: Positive for SOB. Denies cough, chest tightness, and wheezing.   Cardiovascular: Positive for palpitations. Denies chest pain, and leg swelling.  Gastrointestinal: Denies nausea, vomiting, abdominal pain, diarrhea, constipation, blood in stool and abdominal distention.  Genitourinary: Denies dysuria, urgency, frequency, hematuria, flank pain and difficulty urinating.  Musculoskeletal: Denies myalgias, back pain, joint swelling, arthralgias and gait problem.  Skin: Denies pallor, rash and wounds.  Neurological: Positive for dizziness, lightheadedness. Denies seizures, syncope, weakness, numbness and headaches.  Psychiatric/Behavioral: Positive for mood changes, intermittent confusion, nervousness, sleep disturbances, and agitation.   Allergies  Tomato; Imitrex; and Mustard seed  Home Medications   Current Outpatient Rx  Name  Route  Sig  Dispense  Refill  . divalproex (DEPAKOTE ER) 500 MG 24 hr tablet   Oral   Take 2 tablets (1,000 mg total) by mouth 2 (two) times daily in the am and at bedtime.. For mood stability and seizure prevention.   120 tablet   1   . haloperidol (HALDOL) 5 MG tablet      5 mg. Take 4 tab at bed time.         . lamoTRIgine (LAMICTAL) 200 MG tablet  Oral   Take 1 tablet (200 mg total) by mouth at bedtime. For mood stability   30 tablet   1   . levothyroxine (SYNTHROID, LEVOTHROID) 100 MCG tablet   Oral   Take 100 mcg by mouth daily before breakfast.          Physical Exam Filed Vitals:   04/12/13 0730 04/12/13 0858 04/12/13 1200 04/12/13 1445  BP:  90/60  114/78  Pulse:  90 178   Temp: 97.5 F (36.4 C)   98.9 F (37.2 C)  TempSrc: Oral   Oral  Resp: 20 18 18 17   Height:      Weight:      SpO2:   96% 95%  General: Vital  signs reviewed.  Patient is a well-developed and well-nourished, in no acute distress and cooperative with exam. Alert and oriented x2.  Head: Normocephalic and atraumatic. Eyes: PERRL, EOMI, conjunctivae normal, No scleral icterus.  Neck: Supple, trachea midline, normal ROM, No JVD, masses, thyromegaly, or carotid bruit present.  Cardiovascular: RRR, S1 normal, S2 normal, no murmurs, gallops, or rubs. Pulmonary/Chest: Normal respiratory effort, CTAB, no wheezes, rales, or rhonchi. Abdominal: Soft, non-tender, non-distended, bowel sounds are normal, no masses, organomegaly, or guarding present.  Musculoskeletal: No joint deformities, erythema, or stiffness, ROM full and no nontender. Extremities: +2 pitting edema in LE's. Pulses symmetric and intact bilaterally. No cyanosis or clubbing. Neurological: A&O x3, Strength is normal and symmetric bilaterally, cranial nerve II-XII are grossly intact, no focal motor deficit, sensory intact to light touch bilaterally.  Skin: Warm, dry and intact. No rashes or erythema. Psychiatric: Depressed mood w/ normal affect. Speech is pressured, behavior is normal. Cognition and memory are generally normal.   ED Course  Procedures (including critical care time) Labs Review Labs Reviewed  COMPREHENSIVE METABOLIC PANEL - Abnormal; Notable for the following:    Glucose, Bld 124 (*)    Albumin 3.4 (*)    Total Bilirubin <0.1 (*)    GFR calc non Af Amer 83 (*)    All other components within normal limits  TSH  VALPROIC ACID LEVEL  CBC  COMPREHENSIVE METABOLIC PANEL   Imaging Review No results found.  EKG Interpretation   None       MDM   1. Post traumatic stress disorder (PTSD)   2. Schizophrenia, paranoid type   3. Major depressive disorder, recurrent    [name] is a 42 y.o. female w/ PMHx of Depression, Anxiety, Schizophrenia, Bipolar I, Hypothyroidism, chronic anemia, and seizures, presents to the ED from behavioral health w/  tachycardia into the 160's. EKG shows SVT into the 160's. Also had mild hypotension. Patient may be mildly volume depleted, but suspect tachycardia is related to anxiety/panic attacks at this time. No signs/symptoms of infection/sepsis. -Monitor on telemetry. Patient NSR at this time, rate 60-90's. -CBC wnl. -Orthostatic vital signs show no orthostatic HoTN, pulse increase from 78-98. -CMET from earlier today shows no electrolyte abnormalities. -TSH from 04/07/13 2.083, wnl.    -UA should be done at [facility] if patient has dysuria in combination w/ urine odor. Do not suspect UTI at this time.  Patient will need to follow up with cardiology if SVT persists. Stable for discharge/transfer back to behavioral health.  Courtney Paris, MD 04/12/13 [phone]

## 2013-04-12 NOTE — Progress Notes (Signed)
D: Pt presents lethargic this morning, slow, minimal interaction and c/o dizziness. MD made aware. Medications modified. Pt given Gatorade this morning for decreased b/p. Pt encouraged to use wheelchair this morning. Pt A&O x3. Pt reports having auditory and visual hallucination. Pt reports feeling depressed. A: Medications administered as ordered per MD. Pt started on Prozac for depression. Pt encouraged to increase fluids. Writer will continue to reassess pt b/p. Verbal support given. Pt encouraged attend groups. 15 minute checks performed for safety. R: Pt safety maintained.

## 2013-04-12 NOTE — ED Notes (Signed)
[place] contacted for transportation.

## 2013-04-12 NOTE — [facility] Group Notes (Signed)
[facility] LCSW Aftercare Discharge Planning Group Note   04/12/2013 11:22 AM  Participation Quality:  Minimal  Mood/Affect:  Depressed  Depression Rating:  "alot"  Anxiety Rating:  "alot"  Thoughts of Suicide:  No Will you contract for safety?   NA  Current AVH:  Yes  Plan for Discharge/Comments:  Pt c/o symptoms of psychosis, depression, and high blood pressure.  Does not appear to be making progress.  Dr told her today that we may not be able to fix the voices prior to d/c.  Husband visited over weekend, and that was nice.  Transportation Means: family  Supports:  family  [place], Danielle [name]

## 2013-04-12 NOTE — Progress Notes (Signed)
Patient ID: Danielle [name], female   DOB: [birth year], 42 y.o.   MRN: [id number] [facility] MD Progress Note  04/12/2013 11:03 AM Danielle [name]  MRN:  [id number] Subjective:   "I am feeling dizzy, agoraphobic and still hearing voices."  Objective: Patient reports that she is still feeling depressed and  anxious. She continues to reports visual and auditory hallucinations but not as bad as last week. She thinks her medications have started to kick in but some of them have been making her dizzy and lethargic. She complaint with her medications. She also reports that she had a better weekend and was happy to see her 44 year old  When he visited her with her husband on Saturday. Diagnosis:   DSM5: Schizophrenia Disorders: Paranoid schizophrenia  Obsessive-Compulsive Disorders:  Trauma-Stressor Disorders: Posttraumatic Stress Disorder (309.81)  Substance/Addictive Disorders:  Depressive Disorders:  AXIS I: Schizophrenia, paranoid type  AXIS II: Deferred  AXIS III:  Past Medical History   Diagnosis  Date   .  Depressed    .  Seizures    .  Heart murmur    .  Hypothyroidism    .  Anemia    .  Headache(784.0)    .  Anxiety    .  PTSD (post-traumatic stress disorder)    .  Shortness of breath    .  Schizophrenia    .  Bipolar 1 disorder     AXIS IV: problems related to social environment and problems with primary support group  AXIS V: 41-50 serious symptoms  ADL's:  Intact  Sleep: fair  Appetite:  Good  Suicidal Ideation:  Passive SI no plan Homicidal Ideation:  Denies AEB (as evidenced by):  Psychiatric Specialty Exam: Review of Systems  Constitutional: Negative.   HENT: Negative.   Eyes: Negative.   Respiratory: Negative.   Cardiovascular: Negative.   Gastrointestinal: Negative.   Genitourinary: Negative.   Musculoskeletal: Positive for joint pain (Patient has right foot pain from previous fracture. ).  Skin: Negative.   Neurological: Negative.   Endo/Heme/Allergies:  Negative.   Psychiatric/Behavioral: Positive for depression, suicidal ideas and hallucinations. Negative for memory loss and substance abuse. The patient is nervous/anxious. The patient does not have insomnia.     Blood pressure 90/60, pulse 90, temperature 97.5 F (36.4 C), temperature source Oral, resp. rate 18, height 5\' 11"  (1.803 m), weight 108.41 kg (239 lb).Body mass index is 33.35 kg/(m^2).  General Appearance: Casual  Eye Contact::  Good  Speech:  Clear and Coherent  Volume:  Normal  Mood:  Anxious and Dysphoric  Affect:  Congruent  Thought Process:  Goal Directed and Intact  Orientation:  Full (Time, Place, and Person)  Thought Content:  Hallucinations: Auditory Visual, Paranoid Ideation and Rumination  Suicidal Thoughts:  Yes.  without intent/plan  Homicidal Thoughts:  No  Memory:  Immediate;   Good Recent;   Good Remote;   Good  Judgement:  Impaired  Insight:  Shallow  Psychomotor Activity:  Decreased  Concentration:  Fair  Recall:  Good  Akathisia:  No  Handed:  Right  AIMS (if indicated):     Assets:  Communication Skills Desire for Improvement Physical Health Resilience Social Support  Sleep:  Number of Hours: 6   Current Medications: Current Facility-Administered Medications  Medication Dose Route Frequency Provider Last Rate Last Dose  . acetaminophen (TYLENOL) tablet 650 mg  650 mg Oral Q6H PRN Kerry Hough, PA-C   650 mg at 04/07/13 0809  .  alum & mag hydroxide-simeth (MAALOX/MYLANTA) 200-200-20 MG/5ML suspension 30 mL  30 mL Oral Q4H PRN Kerry Hough, PA-C      . divalproex (DEPAKOTE ER) 24 hr tablet 750 mg  750 mg Oral BH-qamhs Humberto Addo   750 mg at 04/12/13 0813  . lamoTRIgine (LAMICTAL) tablet 200 mg  200 mg Oral QHS Kerry Hough, PA-C   200 mg at 04/11/13 2114  . levothyroxine (SYNTHROID, LEVOTHROID) tablet 100 mcg  100 mcg Oral QAC breakfast Kerry Hough, PA-C   100 mcg at 04/12/13 1610  . magnesium hydroxide (MILK OF MAGNESIA)  suspension 30 mL  30 mL Oral Daily PRN Kerry Hough, PA-C      . naproxen (NAPROSYN) tablet 250 mg  250 mg Oral BID WC Fransisca Kaufmann, NP   250 mg at 04/12/13 0813  . OLANZapine zydis (ZYPREXA) disintegrating tablet 5 mg  5 mg Oral Q8H PRN Aristotelis Vilardi      . paliperidone (INVEGA) 24 hr tablet 12 mg  12 mg Oral Daily Latessa Tillis   12 mg at 04/12/13 0813  . traZODone (DESYREL) tablet 150 mg  150 mg Oral QHS Vitaly Wanat   150 mg at 04/11/13 2114  . trihexyphenidyl (ARTANE) tablet 5 mg  5 mg Oral Daily Gisel Vipond   5 mg at 04/12/13 9604    Lab Results:  Results for orders placed during the hospital encounter of 04/06/13 (from the past 48 hour(s))  VALPROIC ACID LEVEL     Status: None   Collection Time    04/12/13  6:30 AM      Result Value Range   Valproic Acid Lvl 78.1  50.0 - 100.0 ug/mL   Comment: Performed at [facility]  COMPREHENSIVE METABOLIC PANEL     Status: Abnormal   Collection Time    04/12/13  6:30 AM      Result Value Range   Sodium 137  135 - 145 mEq/L   Potassium 4.3  3.5 - 5.1 mEq/L   Chloride 102  96 - 112 mEq/L   CO2 22  19 - 32 mEq/L   Glucose, Bld 124 (*) 70 - 99 mg/dL   BUN 11  6 - 23 mg/dL   Creatinine, Ser 5.40  0.50 - 1.10 mg/dL   Calcium 9.1  8.4 - 98.1 mg/dL   Total Protein 6.9  6.0 - 8.3 g/dL   Albumin 3.4 (*) 3.5 - 5.2 g/dL   AST 9  0 - 37 U/L   ALT 7  0 - 35 U/L   Alkaline Phosphatase 96  39 - 117 U/L   Total Bilirubin <0.1 (*) 0.3 - 1.2 mg/dL   GFR calc non Af Amer 83 (*) >90 mL/min   GFR calc Af Amer >90  >90 mL/min   Comment: (NOTE)     The eGFR has been calculated using the CKD EPI equation.     This calculation has not been validated in all clinical situations.     eGFR's persistently <90 mL/min signify possible Chronic Kidney     Disease.     Performed at [facility]    Physical Findings: AIMS: Facial and Oral Movements Muscles of Facial Expression: None, normal Lips and Perioral Area: None,  normal Jaw: None, normal Tongue: None, normal,Extremity Movements Upper (arms, wrists, hands, fingers): None, normal Lower (legs, knees, ankles, toes): None, normal, Trunk Movements Neck, shoulders, hips: None, normal, Overall Severity Severity of abnormal movements (highest score from questions  above): None, normal Incapacitation due to abnormal movements: None, normal, Dental Status Current problems with teeth and/or dentures?: Yes Does patient usually wear dentures?: Yes (pt. does have with her "I lost them")  CIWA:    COWS:     Treatment Plan Summary: Daily contact with patient to assess and evaluate symptoms and progress in treatment Medication management  Plan: Continue crisis management and stabilization.  Medication management: Reviewed with patient who stated no untoward effects. Continue Depakote 750 mg every am and hs, Lamictal 200 mg hs for improved mood stability.  Continue Invega to 12 mg Daily for psychosis.  Decrease  Zyprexa Zydis to 5mg  every eight hours as need for psychosis due to dizziness.  Encouraged patient to attend groups and participate in group counseling sessions and activities.  Discharge plan in progress.  Continue current treatment plan.  Address health issues: Vitals reviewed and stable. Patient reports decrease in foot pain since being started on Naproxen.  Follow up Valproic acid level.  Medical Decision Making Problem Points:  Established problem,  improving (1) and Review of psycho-social stressors (1) Data Points:  Review of medication regiment & side effects (2)  I certify that inpatient services furnished can reasonably be expected to improve the patient's condition.   Thedore Mins, MD 04/12/2013, 11:03 AM

## 2013-04-12 NOTE — Progress Notes (Signed)
Pt sent to ED via EMS. Pt ekg results supraventricular tachycardia. Writer unable to detect manual B/P. Writer attempted assessing pt B/P using Dinamap and  Manually.

## 2013-04-12 NOTE — Progress Notes (Signed)
Adult Psychoeducational Group Note  Date:  04/12/2013 Time:  11:46 PM  Group Topic/Focus:  Wrap-Up Group:   The focus of this group is to help patients review their daily goal of treatment and discuss progress on daily workbooks.  Participation Level:  Minimal  Participation Quality:  Appropriate  Affect:  Flat  Cognitive:  Lacking  Insight: Limited  Engagement in Group:  Engaged  Modes of Intervention:  Support  Additional Comments:  Patient attended and participated in group tonight. She reports having an eventful day. She went to the hospital because her blood pressure was too high. While at the hospital her husband visited with her. She went to her groups and meals today. For her wellness she walk the dog and take her medication.  Lita Mains [facility] 04/12/2013, 11:46 PM

## 2013-04-12 NOTE — Progress Notes (Signed)
Recreation Therapy Notes   Date: 12.08.2014 Time: 9:30am Location: [address] Dayroom  Group Topic: Wellness  Goal Area(s) Addresses:  Patient will define components of whole wellness. Patient will verbalize benefit to self of whole wellness.  Behavioral Response: Appropriate   Intervention: Mind Map  Activity: Patients created a group flow chart relating to wellness and what makes up wellness. Patients were then asked to identify what they do to invest in each part of wellness.    Education: Wellness, Discharge Planning  Education Outcome: Acknowledges Education   Clinical Observations/Feedback: Patient with peers identified the following dimensions of wellness: Mental, Emotions, Physical and Spiritual. Patient presented with flat, withdrawn affect and appeared to "zone out" periodically and often. Patient engaged in group session to the best of her ability and was only able to offer minimal additions to group Mind Map.    Marykay Lex Fernande Treiber, LRT/CTRS  Jearl Klinefelter 04/12/2013 12:33 PM

## 2013-04-12 NOTE — Progress Notes (Signed)
D   Pt came back from the ED with a normal pulse level in the 80's   She reported the EMS Techs had her push hard like she was having a BM and that's what brought her pulse down   She reported being scared during the episode but feels calmer now   She still appears anxious and sad   She is compliant with treatment and denies suicidal and homicidal ideation A   Verbal support given   Medications administered and effectiveness monitored   Q 15 min checks R   Pt safe at present

## 2013-04-12 NOTE — ED Notes (Signed)
Pt ambulated to bathroom with 2 assist, pt still complaining of being dizzy and her urine smells.

## 2013-04-13 MED ORDER — LAMOTRIGINE 100 MG PO TABS
150.0000 mg | ORAL_TABLET | Freq: Every day | ORAL | Status: DC
  Administered 2013-04-13 – 2013-04-15 (×3): 150 mg via ORAL
  Filled 2013-04-13: qty 1.5
  Filled 2013-04-13: qty 5
  Filled 2013-04-13 (×3): qty 1.5
  Filled 2013-04-13: qty 2

## 2013-04-13 MED ORDER — PALIPERIDONE ER 3 MG PO TB24
9.0000 mg | ORAL_TABLET | Freq: Every day | ORAL | Status: DC
  Administered 2013-04-14 – 2013-04-16 (×3): 9 mg via ORAL
  Filled 2013-04-13: qty 9
  Filled 2013-04-13 (×5): qty 3

## 2013-04-13 MED ORDER — TRIHEXYPHENIDYL HCL 2 MG PO TABS
2.0000 mg | ORAL_TABLET | Freq: Every day | ORAL | Status: DC
  Administered 2013-04-14 – 2013-04-16 (×3): 2 mg via ORAL
  Filled 2013-04-13 (×4): qty 1
  Filled 2013-04-13: qty 3
  Filled 2013-04-13: qty 1

## 2013-04-13 NOTE — Tx Team (Signed)
  Interdisciplinary Treatment Plan Update   Date Reviewed:  04/13/2013  Time Reviewed:  10:55 AM  Progress in Treatment:   Attending groups: Sporadically Participating in groups: Sporadically Taking medication as prescribed: Yes  Tolerating medication: Yes Family/Significant other contact made: Yes  Patient understands diagnosis: Yes  Discussing patient identified problems/goals with staff: Yes Medical problems stabilized or resolved: Yes Denies suicidal/homicidal ideation: Yes Patient has not harmed self or others: Yes  For review of initial/current patient goals, please see plan of care.  Estimated Length of Stay:  3-5 days  Reason for Continuation of Hospitalization: Depression Medication stabilization  New Problems/Goals identified:  N/A  Discharge Plan or Barriers:   return home, follow up [facility] outpt  Additional Comments:  Attendees:  Signature: Thedore Mins, MD 04/13/2013 10:55 AM   Signature: Richelle Ito, LCSW 04/13/2013 10:55 AM  Signature: Fransisca Kaufmann, NP 04/13/2013 10:55 AM  Signature: Joslyn Devon, RN 04/13/2013 10:55 AM  Signature: Liborio Nixon, RN 04/13/2013 10:55 AM  Signature:  04/13/2013 10:55 AM  Signature:   04/13/2013 10:55 AM  Signature:    Signature:    Signature:    Signature:    Signature:    Signature:      Scribe for Treatment Team:   Richelle Ito, LCSW  04/13/2013 10:55 AM

## 2013-04-13 NOTE — Progress Notes (Signed)
Patient ID: Danielle [name], female   DOB: 1971-04-21, 42 y.o.   MRN: [id number] D: Patient has been lying in bed today, getting up for meals and groups.  Patient has not had any more episodes of SVT.  Her last pulse rate was 92.  She reports that he voices have decreased.  She reports that her depression has decreased, however, she rates her depression as an 8 on her inventory sheet.  She denies any SI/HI.  Patient is not attending to her personal hygiene.  A: continue to monitor medication management and MD orders.  Safety checks completed every 15 minutes per protocol.  R: patient remains isolative to room and has minimal contact with staff.

## 2013-04-13 NOTE — Progress Notes (Signed)
Adult Psychoeducational Group Note  Date:  04/13/2013 Time:  9:41 PM  Group Topic/Focus:  Wrap-Up Group:   The focus of this group is to help patients review their daily goal of treatment and discuss progress on daily workbooks.  Participation Level:  Active  Participation Quality:  Drowsy  Affect:  Appropriate  Cognitive:  Appropriate  Insight: Appropriate  Engagement in Group:  Engaged  Modes of Intervention:  Discussion  Additional Comments:patient states that her day was somewhat good. Patient stated that she has a visit from her husband and her blood pressure stayed down. Patient wants to find a mental health support group for her and her husband to attend as a part of her aftercare plan.  [facility] 04/13/2013, 9:41 PM

## 2013-04-13 NOTE — Progress Notes (Signed)
Patient ID: Danielle [name], female   DOB: [birth year], 42 y.o.   MRN: [id number] [facility] MD Progress Note  04/13/2013 1:36 PM Danielle [name]  MRN:  [id number] Subjective:  Patient states "I am just tired today. The voices have been decreasing. I am finally getting some relief. I am still seeing shadows but maybe a few times a day instead of all the time."   Objective:  Patient is assessed resting in bed today. She was sent to the ED yesterday for workup of SVT, which resolved with Vasalva maneuver. Today the patient is denying any dizziness, SOB, or heart palpitations. Danielle [name] is appearing less depressed and anxious. She reports getting a postive benefit from her medications that have just started helping her. Patient continues to have auditory and visual hallucinations but is reporting a gradual decrease in frequency.   Diagnosis:   DSM5: Schizophrenia Disorders: Paranoid schizophrenia  Obsessive-Compulsive Disorders:  Trauma-Stressor Disorders: Posttraumatic Stress Disorder (309.81)  Substance/Addictive Disorders:  Depressive Disorders:  AXIS I: Schizophrenia, paranoid type  AXIS II: Deferred  AXIS III:  Past Medical History   Diagnosis  Date   .  Depressed    .  Seizures    .  Heart murmur    .  Hypothyroidism    .  Anemia    .  Headache(784.0)    .  Anxiety    .  PTSD (post-traumatic stress disorder)    .  Shortness of breath    .  Schizophrenia    .  Bipolar 1 disorder     AXIS IV: problems related to social environment and problems with primary support group  AXIS V: 51-60 Moderate Symptoms  ADL's:  Intact  Sleep: fair  Appetite:  Good  Suicidal Ideation:  Passive SI no plan Homicidal Ideation:  Denies AEB (as evidenced by):  Psychiatric Specialty Exam: Review of Systems  Constitutional: Negative.   HENT: Negative.   Eyes: Negative.   Respiratory: Negative for cough, hemoptysis, sputum production, shortness of breath and wheezing.   Cardiovascular: Negative.   Negative for chest pain, palpitations, orthopnea, claudication, leg swelling and PND.  Gastrointestinal: Negative.   Genitourinary: Negative.   Musculoskeletal: Positive for joint pain (Patient has right foot pain from previous fracture. ).  Skin: Negative.   Neurological: Negative.   Endo/Heme/Allergies: Negative.   Psychiatric/Behavioral: Positive for depression, suicidal ideas and hallucinations. Negative for memory loss and substance abuse. The patient is nervous/anxious. The patient does not have insomnia.     Blood pressure 128/85, pulse 112, temperature 97.5 F (36.4 C), temperature source Oral, resp. rate 20, height 5\' 11"  (1.803 m), weight 108.41 kg (239 lb), SpO2 99.00%.Body mass index is 33.35 kg/(m^2).  General Appearance: Casual  Eye Contact::  Good  Speech:  Clear and Coherent  Volume:  Normal  Mood:  Anxious and Dysphoric  Affect:  Congruent  Thought Process:  Goal Directed and Intact  Orientation:  Full (Time, Place, and Person)  Thought Content:  Hallucinations: Auditory Visual, Paranoid Ideation and Rumination  Suicidal Thoughts:  Yes.  without intent/plan  Homicidal Thoughts:  No  Memory:  Immediate;   Good Recent;   Good Remote;   Good  Judgement:  Impaired  Insight:  Shallow  Psychomotor Activity:  Decreased  Concentration:  Fair  Recall:  Good  Akathisia:  No  Handed:  Right  AIMS (if indicated):     Assets:  Communication Skills Desire for Improvement Physical Health Resilience Social Support  Sleep:  Number of  Hours: 6.5   Current Medications: Current Facility-Administered Medications  Medication Dose Route Frequency Provider Last Rate Last Dose  . acetaminophen (TYLENOL) tablet 650 mg  650 mg Oral Q6H PRN Kerry Hough, PA-C   650 mg at 04/07/13 0809  . alum & mag hydroxide-simeth (MAALOX/MYLANTA) 200-200-20 MG/5ML suspension 30 mL  30 mL Oral Q4H PRN Kerry Hough, PA-C      . divalproex (DEPAKOTE ER) 24 hr tablet 750 mg  750 mg Oral  BH-qamhs Mojeed Akintayo   750 mg at 04/13/13 0747  . FLUoxetine (PROZAC) capsule 20 mg  20 mg Oral Daily Mojeed Akintayo   20 mg at 04/13/13 0747  . lamoTRIgine (LAMICTAL) tablet 150 mg  150 mg Oral QHS Mojeed Akintayo      . levothyroxine (SYNTHROID, LEVOTHROID) tablet 100 mcg  100 mcg Oral QAC breakfast Kerry Hough, PA-C   100 mcg at 04/13/13 1610  . magnesium hydroxide (MILK OF MAGNESIA) suspension 30 mL  30 mL Oral Daily PRN Kerry Hough, PA-C      . naproxen (NAPROSYN) tablet 250 mg  250 mg Oral BID WC Fransisca Kaufmann, NP   250 mg at 04/13/13 0747  . OLANZapine zydis (ZYPREXA) disintegrating tablet 5 mg  5 mg Oral Q8H PRN Mojeed Akintayo      . [START ON 04/14/2013] paliperidone (INVEGA) 24 hr tablet 9 mg  9 mg Oral Daily Mojeed Akintayo      . traZODone (DESYREL) tablet 150 mg  150 mg Oral QHS Mojeed Akintayo   150 mg at 04/12/13 2125  . [START ON 04/14/2013] trihexyphenidyl (ARTANE) tablet 2 mg  2 mg Oral Daily Mojeed Akintayo        Lab Results:  Results for orders placed during the hospital encounter of 04/06/13 (from the past 48 hour(s))  VALPROIC ACID LEVEL     Status: None   Collection Time    04/12/13  6:30 AM      Result Value Range   Valproic Acid Lvl 78.1  50.0 - 100.0 ug/mL   Comment: Performed at [facility]  COMPREHENSIVE METABOLIC PANEL     Status: Abnormal   Collection Time    04/12/13  6:30 AM      Result Value Range   Sodium 137  135 - 145 mEq/L   Potassium 4.3  3.5 - 5.1 mEq/L   Chloride 102  96 - 112 mEq/L   CO2 22  19 - 32 mEq/L   Glucose, Bld 124 (*) 70 - 99 mg/dL   BUN 11  6 - 23 mg/dL   Creatinine, Ser 9.60  0.50 - 1.10 mg/dL   Calcium 9.1  8.4 - 45.4 mg/dL   Total Protein 6.9  6.0 - 8.3 g/dL   Albumin 3.4 (*) 3.5 - 5.2 g/dL   AST 9  0 - 37 U/L   ALT 7  0 - 35 U/L   Alkaline Phosphatase 96  39 - 117 U/L   Total Bilirubin <0.1 (*) 0.3 - 1.2 mg/dL   GFR calc non Af Amer 83 (*) >90 mL/min   GFR calc Af Amer >90  >90 mL/min   Comment:  (NOTE)     The eGFR has been calculated using the CKD EPI equation.     This calculation has not been validated in all clinical situations.     eGFR's persistently <90 mL/min signify possible Chronic Kidney     Disease.     Performed at [facility]  CBC     Status: Abnormal   Collection Time    04/12/13  3:34 PM      Result Value Range   WBC 6.7  4.0 - 10.5 K/uL   RBC 3.80 (*) 3.87 - 5.11 MIL/uL   Hemoglobin 12.4  12.0 - 15.0 g/dL   HCT 45.4  09.8 - 11.9 %   MCV 96.6  78.0 - 100.0 fL   MCH 32.6  26.0 - 34.0 pg   MCHC 33.8  30.0 - 36.0 g/dL   RDW 14.7  82.9 - 56.2 %   Platelets 290  150 - 400 K/uL    Physical Findings: AIMS: Facial and Oral Movements Muscles of Facial Expression: None, normal Lips and Perioral Area: None, normal Jaw: None, normal Tongue: None, normal,Extremity Movements Upper (arms, wrists, hands, fingers): None, normal Lower (legs, knees, ankles, toes): None, normal, Trunk Movements Neck, shoulders, hips: None, normal, Overall Severity Severity of abnormal movements (highest score from questions above): None, normal Incapacitation due to abnormal movements: None, normal, Dental Status Current problems with teeth and/or dentures?: Yes Does patient usually wear dentures?: Yes (pt. does have with her "I lost them")  CIWA:    COWS:     Treatment Plan Summary: Daily contact with patient to assess and evaluate symptoms and progress in treatment Medication management  Plan: Continue crisis management and stabilization.  Medication management: Reviewed with patient who stated no untoward effects. Continue Depakote 750 mg every am and hs, Lamictal 200 mg hs for improved mood stability. Decrease Invega to 9 mg Daily for psychosis and Lamictal to 150 mg hs due to possible medication side effect affecting cardiac system.  Encouraged patient to attend groups and participate in group counseling sessions and activities.  Discharge plan in progress.   Continue current treatment plan.  Address health issues: Vitals reviewed and stable. Patient reports decrease in foot pain since being started on Naproxen.  Valproic Acid Level is 78.1.   Medical Decision Making Problem Points:  Established problem,  improving (1) and Review of psycho-social stressors (1) Data Points:  Review of medication regiment & side effects (2)  I certify that inpatient services furnished can reasonably be expected to improve the patient's condition.   Fransisca Kaufmann, NP-C 04/13/2013, 1:36 PM

## 2013-04-13 NOTE — [facility] Group Notes (Signed)
[facility] LCSW Group Therapy  04/13/2013 , 12:18 PM   Type of Therapy:  Group Therapy  Participation Level:  Did not attend.  In bed asleep.    Summary of Progress/Problems: Today's group focused on the term Diagnosis.  Participants were asked to define the term, and then pronounce whether it is a negative, positive or neutral term.  [name] B 04/13/2013 , 12:18 PM

## 2013-04-14 DIAGNOSIS — R45851 Suicidal ideations: Secondary | ICD-10-CM

## 2013-04-14 LAB — URINE MICROSCOPIC-ADD ON

## 2013-04-14 LAB — URINALYSIS, ROUTINE W REFLEX MICROSCOPIC
Bilirubin Urine: NEGATIVE
Glucose, UA: NEGATIVE mg/dL
Ketones, ur: NEGATIVE mg/dL
Nitrite: POSITIVE — AB
Protein, ur: NEGATIVE mg/dL
Specific Gravity, Urine: 1.015 (ref 1.005–1.030)
Urobilinogen, UA: 0.2 mg/dL (ref 0.0–1.0)
pH: 7 (ref 5.0–8.0)

## 2013-04-14 MED ORDER — NITROFURANTOIN MACROCRYSTAL 50 MG PO CAPS
100.0000 mg | ORAL_CAPSULE | Freq: Four times a day (QID) | ORAL | Status: DC
  Administered 2013-04-14 – 2013-04-16 (×9): 100 mg via ORAL
  Filled 2013-04-14 (×3): qty 1
  Filled 2013-04-14: qty 40
  Filled 2013-04-14 (×2): qty 1
  Filled 2013-04-14: qty 40
  Filled 2013-04-14 (×3): qty 1
  Filled 2013-04-14 (×2): qty 40
  Filled 2013-04-14 (×4): qty 1
  Filled 2013-04-14: qty 2

## 2013-04-14 MED ORDER — FLUOXETINE HCL 20 MG PO CAPS
20.0000 mg | ORAL_CAPSULE | Freq: Once | ORAL | Status: DC
  Administered 2013-04-14: 20 mg via ORAL
  Filled 2013-04-14: qty 1

## 2013-04-14 MED ORDER — BUPROPION HCL ER (XL) 300 MG PO TB24
300.0000 mg | ORAL_TABLET | Freq: Every day | ORAL | Status: DC
  Administered 2013-04-14 – 2013-04-16 (×3): 300 mg via ORAL
  Filled 2013-04-14 (×5): qty 1
  Filled 2013-04-14: qty 3

## 2013-04-14 MED ORDER — DIVALPROEX SODIUM ER 500 MG PO TB24
500.0000 mg | ORAL_TABLET | ORAL | Status: DC
  Administered 2013-04-14 – 2013-04-15 (×2): 500 mg via ORAL
  Filled 2013-04-14 (×4): qty 1

## 2013-04-14 MED ORDER — FLUOXETINE HCL 20 MG PO CAPS
40.0000 mg | ORAL_CAPSULE | Freq: Every day | ORAL | Status: DC
  Filled 2013-04-14: qty 2

## 2013-04-14 NOTE — Progress Notes (Signed)
Patient ID: Danielle [name], female   DOB: [birth year], 42 y.o.   MRN: [id number] D: Code blue called in cafeteria due patient incident.  Patient was observed on floor leaning against her husband.  Husband and staff stated she did not fall to the floor; the husband helped her to the floor.  Patient stated she became dizzy/light headed.  Vitals were taken: BP 130/81; 94% O2; P95.  Patient was leaning against her husband when he stated, "she's going out again."  She didn't appear to have seizure activity due to her responsiveness.  She was helped to a wheelchair and husband and MHT wheeled her to her room.  Patient was laid in bed and she started talking to this nurse and her husband.  Husband stated, "I'm used to this.  I've been going through this for years."  Dr. Lolly Mustache, her psychiatrist was present when incident occurred.  Thurman Coyer, RN was also present.  Patient has a hx of unwitnessed falls at [facility] and usually requests a wheelchair to ambulate in.  She is wearing a boot on her right foot due to a fracture.  She has been wearing it since July and it was due to come off today. Patient is currently alert and oriented.  She is visiting with her husband.

## 2013-04-14 NOTE — Progress Notes (Signed)
Patient ID: Danielle [name], female   DOB: 10-25-1970, 42 y.o.   MRN: [id number] [facility] MD Progress Note  04/14/2013 11:02 AM Danielle [name]  MRN:  [id number] Subjective:   "I am feeling tired, still depressed and has no motivation." Objective: Patient reports ongoing depressive symptoms, low energy level, feeling tired and lack of motivation. However, she reports decreased anxiety, mood lability, delusions and psychosis. Patient has difficulty with grooming and personal hygiene, she was reported by the staff to pass urine on herself accidentally yesterday. She is compliant with her medications and denies suicidal/homicidal ideation, intent or plan. Diagnosis:   DSM5: Schizophrenia Disorders: Paranoid schizophrenia  Obsessive-Compulsive Disorders:  Trauma-Stressor Disorders: Posttraumatic Stress Disorder (309.81)  Substance/Addictive Disorders:  Depressive Disorders:  AXIS I: Schizophrenia, paranoid type  AXIS II: Deferred  AXIS III:  Past Medical History   Diagnosis  Date   .  Depressed    .  Seizures    .  Heart murmur    .  Hypothyroidism    .  Anemia    .  Headache(784.0)    .  Anxiety    .  PTSD (post-traumatic stress disorder)    .  Shortness of breath    .  Schizophrenia    .  Bipolar 1 disorder     AXIS IV: problems related to social environment and problems with primary support group  AXIS V: 41-50 serious symptoms  ADL's:  Intact  Sleep: fair  Appetite:  Good  Suicidal Ideation:  Passive SI no plan Homicidal Ideation:  Denies AEB (as evidenced by):  Psychiatric Specialty Exam: Review of Systems  Constitutional: Negative.   HENT: Negative.   Eyes: Negative.   Respiratory: Negative.   Cardiovascular: Negative.   Gastrointestinal: Negative.   Genitourinary: Negative.   Musculoskeletal: Positive for joint pain (Patient has right foot pain from previous fracture. ).  Skin: Negative.   Neurological: Negative.   Endo/Heme/Allergies: Negative.    Psychiatric/Behavioral: Positive for depression and hallucinations. Negative for memory loss and substance abuse. The patient does not have insomnia.     Blood pressure 103/72, pulse 88, temperature 98.1 F (36.7 C), temperature source Oral, resp. rate 20, height 5\' 11"  (1.803 m), weight 108.41 kg (239 lb), SpO2 99.00%.Body mass index is 33.35 kg/(m^2).  General Appearance: Casual  Eye Contact::  Good  Speech:  Clear and Coherent  Volume:  Normal  Mood:  Dysphoric  Affect:  Congruent  Thought Process:  Goal Directed and Intact  Orientation:  Full (Time, Place, and Person)  Thought Content:  Hallucinations: Auditory Visual, Paranoid Ideation and Rumination  Suicidal Thoughts:  Yes.  without intent/plan  Homicidal Thoughts:  No  Memory:  Immediate;   Good Recent;   Good Remote;   Good  Judgement:  Impaired  Insight:  Shallow  Psychomotor Activity:  Decreased  Concentration:  Fair  Recall:  Good  Akathisia:  No  Handed:  Right  AIMS (if indicated):     Assets:  Communication Skills Desire for Improvement Physical Health Resilience Social Support  Sleep:  Number of Hours: 5.5   Current Medications: Current Facility-Administered Medications  Medication Dose Route Frequency Provider Last Rate Last Dose  . acetaminophen (TYLENOL) tablet 650 mg  650 mg Oral Q6H PRN Kerry Hough, PA-C   650 mg at 04/13/13 2110  . alum & mag hydroxide-simeth (MAALOX/MYLANTA) 200-200-20 MG/5ML suspension 30 mL  30 mL Oral Q4H PRN Kerry Hough, PA-C      . buPROPion (  WELLBUTRIN XL) 24 hr tablet 300 mg  300 mg Oral Daily Mitsuko Luera      . divalproex (DEPAKOTE ER) 24 hr tablet 500 mg  500 mg Oral BH-qamhs Duanne Duchesne      . lamoTRIgine (LAMICTAL) tablet 150 mg  150 mg Oral QHS Jamillah Camilo   150 mg at 04/13/13 2108  . levothyroxine (SYNTHROID, LEVOTHROID) tablet 100 mcg  100 mcg Oral QAC breakfast Kerry Hough, PA-C   100 mcg at 04/14/13 9147  . magnesium hydroxide (MILK OF  MAGNESIA) suspension 30 mL  30 mL Oral Daily PRN Kerry Hough, PA-C      . naproxen (NAPROSYN) tablet 250 mg  250 mg Oral BID WC Fransisca Kaufmann, NP   250 mg at 04/14/13 0806  . nitrofurantoin (MACRODANTIN) capsule 100 mg  100 mg Oral Q6H Fransisca Kaufmann, NP      . OLANZapine zydis (ZYPREXA) disintegrating tablet 5 mg  5 mg Oral Q8H PRN Mical Kicklighter      . paliperidone (INVEGA) 24 hr tablet 9 mg  9 mg Oral Daily Janasha Barkalow   9 mg at 04/14/13 0805  . traZODone (DESYREL) tablet 150 mg  150 mg Oral QHS Evanne Matsunaga   150 mg at 04/13/13 2108  . trihexyphenidyl (ARTANE) tablet 2 mg  2 mg Oral Daily Trevaris Pennella   2 mg at 04/14/13 8295    Lab Results:  Results for orders placed during the hospital encounter of 04/06/13 (from the past 48 hour(s))  CBC     Status: Abnormal   Collection Time    04/12/13  3:34 PM      Result Value Range   WBC 6.7  4.0 - 10.5 K/uL   RBC 3.80 (*) 3.87 - 5.11 MIL/uL   Hemoglobin 12.4  12.0 - 15.0 g/dL   HCT 62.1  30.8 - 65.7 %   MCV 96.6  78.0 - 100.0 fL   MCH 32.6  26.0 - 34.0 pg   MCHC 33.8  30.0 - 36.0 g/dL   RDW 84.6  96.2 - 95.2 %   Platelets 290  150 - 400 K/uL  URINALYSIS, ROUTINE W REFLEX MICROSCOPIC     Status: Abnormal   Collection Time    04/13/13  9:19 PM      Result Value Range   Color, Urine YELLOW  YELLOW   APPearance CLOUDY (*) CLEAR   Specific Gravity, Urine 1.015  1.005 - 1.030   pH 7.0  5.0 - 8.0   Glucose, UA NEGATIVE  NEGATIVE mg/dL   Hgb urine dipstick SMALL (*) NEGATIVE   Bilirubin Urine NEGATIVE  NEGATIVE   Ketones, ur NEGATIVE  NEGATIVE mg/dL   Protein, ur NEGATIVE  NEGATIVE mg/dL   Urobilinogen, UA 0.2  0.0 - 1.0 mg/dL   Nitrite POSITIVE (*) NEGATIVE   Leukocytes, UA SMALL (*) NEGATIVE   Comment: Performed at [facility]  URINE MICROSCOPIC-ADD ON     Status: Abnormal   Collection Time    04/13/13  9:19 PM      Result Value Range   Squamous Epithelial / LPF RARE  RARE   WBC, UA 7-10  <3 WBC/hpf    RBC / HPF 0-2  <3 RBC/hpf   Bacteria, UA MANY (*) RARE   Comment: Performed at [facility]    Physical Findings: AIMS: Facial and Oral Movements Muscles of Facial Expression: None, normal Lips and Perioral Area: None, normal Jaw: None, normal Tongue: None, normal,Extremity  Movements Upper (arms, wrists, hands, fingers): None, normal Lower (legs, knees, ankles, toes): None, normal, Trunk Movements Neck, shoulders, hips: None, normal, Overall Severity Severity of abnormal movements (highest score from questions above): None, normal Incapacitation due to abnormal movements: None, normal, Dental Status Current problems with teeth and/or dentures?: Yes Does patient usually wear dentures?: Yes (pt. does have with her "I lost them")  CIWA:    COWS:     Treatment Plan Summary: Daily contact with patient to assess and evaluate symptoms and progress in treatment Medication management  Plan: Continue crisis management and stabilization.  Medication management: Reviewed with patient who stated no untoward effects. Decrease Depakote to 500 mg every am and hs, Lamictal 150 mg hs for improved mood stability and Invega to 9 mg Daily for psychosis. Discontinue Prozac and initiate Wellbutrin XR 300mg  po daily for depression. Continue Zyprexa Zydis to 5mg  every eight hours as need for psychosis due to dizziness.  Encouraged patient to attend groups and participate in group counseling sessions and activities.  Discharge plan in progress.  Continue current treatment plan.  Address health issues: Vitals reviewed and stable. Patient reports decrease in foot pain since being started on Naproxen.  Follow up Valproic acid level.  Medical Decision Making Problem Points:  Established problem,  improving (1) and Review of psycho-social stressors (1) Data Points:  Review of medication regiment & side effects (2)  I certify that inpatient services furnished can reasonably be expected to  improve the patient's condition.   Thedore Mins, MD 04/14/2013, 11:02 AM

## 2013-04-14 NOTE — BHH Group Notes (Signed)
[facility] LCSW Aftercare Discharge Planning Group Note   04/14/2013 10:30 AM  Participation Quality:  Invited, chose not to come    [place], [place]asa Distance B

## 2013-04-14 NOTE — Progress Notes (Signed)
Adult Psychoeducational Group Note  Date:  04/14/2013 Time:  9:25 PM  Group Topic/Focus:  Wrap-Up Group:   The focus of this group is to help patients review their daily goal of treatment and discuss progress on daily workbooks.  Participation Level:  Minimal  Participation Quality:  Appropriate  Affect:  Appropriate  Cognitive:  Lacking  Insight: Limited  Engagement in Group:  Engaged  Modes of Intervention:  Support  Additional Comments:  Patient attended and participated in group tonight. She reports her day was "so so". She passed out in the cafeteria. She felt tired all day and is still feeling tired. The highlight of her day was her husband visiting with her. For her personal development she would like to get involve with a support program in the community.  Lita Mains [facility] 04/14/2013, 9:25 PM

## 2013-04-14 NOTE — Progress Notes (Signed)
Patient ID: Danielle [name], female   DOB: 1971/04/28, 42 y.o.   MRN: [id number] D: patient presents with flat affect, depressed mood.  She reports that her depression has decreased from yesterday.  She denies any SI/HI/AVH.  Patient was asked to take a bath this morning due to her poor hygiene.  She was also requested to clean her food, as she had food and drink present.  Patient stated she had washed all her clothes and that she had bathed this morning.  Patient became irrate and threw a pencil across the room.  She then came to staff with some clothing in a bag.  The clothing was put in the washer and the bag was soaked in urine.  Patient was changing sheet when nurse entered the room.  She stated, "I had an accident last night."  Patient evidently laid in her urine during the night.  Patient was informed to let staff know if she needed assistance and it was her responsibility to keep her hygiene up and keep food out of the room.  A: patient irritable and has minimal interaction with staff and others in the milieu.

## 2013-04-14 NOTE — Progress Notes (Signed)
Recreation Therapy Notes  Date: 12.10.2014 Time: 9:30am Location: [address] Dayroom  Group Topic: Leisure Education  Goal Area(s) Addresses:  Patient will identify positive leisure activities.  Patient will identify one positive benefit of participation in leisure activities.   Behavioral Response: Appropriate   Intervention: Game  Activity: Leisure ABC's.   Education:  Leisure Education, Pharmacologist, Building control surveyor.   Education Outcome: Acknowledges understanding  Clinical Observations/Feedback: Patient arrived late to group session, upon arrival patient actively listened to group discussion, but made no contributions.   Marykay Lex Cotton Beckley, LRT/CTRS  Slade Pierpoint L 04/14/2013 1:51 PM

## 2013-04-14 NOTE — Progress Notes (Signed)
D: Patient in her room in bed on first approach.  Patient states she had a pretty good day.  Patient states she was happy because her husband came to visit her.  Patient denies SI/HI but states she is hearing auditory hallucinations.  Patient verbally contracts for safety.  Patient using her wheelchair tonight. A: Staff to monitor Q 15 mins for safety.  Encouragement and support offered.  Scheduled medications administered per orders. R: Patient remains safe on the unit.  Patient attended group tonight.  Patient visible on the unit.  Patient taking administered medications.

## 2013-04-14 NOTE — Progress Notes (Signed)
D   Pt  Reports feeling some better   She requested a list of her medications and why she takes them so she can give it to her husband   She said she is close to being ready to go home   She attends group with some minimal participation  She has minimal interaction with other pts A   Encouraged pt to get out of her room more   Provided a list of medications as she requested  Administered due medications as ordered and evaluated effectiveness   Q 15 min checks R   Pt safe at present

## 2013-04-14 NOTE — ED Provider Notes (Signed)
I saw and evaluated the patient, reviewed the resident's note and I agree with the findings and plan. I personally reviewed the pt's EKGs.   EKG Interpretation    Date/Time:    Ventricular Rate:    PR Interval:    QRS Duration:   QT Interval:    QTC Calculation:   R Axis:     Text Interpretation:              42 year old female transferred from behavior health for evaluation of tachycardia. EKG prior to arrival was reviewed. Regular, narrow complex tachycardia consistent with SVT. Converted prior to arrival. Workup the emergency room has been relatively unremarkable. Patient currently with no somatic complaints. Feel she is safe for discharge back to the behavioral health facility. She needs routine outpatient cardiology followup at this point. Return precautions were discussed.  Raeford Razor, MD 04/14/13 1356

## 2013-04-14 NOTE — BHH Group Notes (Signed)
[facility] [employer] Group Therapy  04/14/2013 , 1:20 PM    Type of Therapy:  [employer] Presentation  Participation Level:  Invited, stated she did not feel up to attending   Summary of Progress/Problems:  Onalee Hua from [employer] came to present his recovery story and play the guitar.    [name] B 04/14/2013 , 1:20 PM

## 2013-04-15 MED ORDER — DIVALPROEX SODIUM ER 250 MG PO TB24
750.0000 mg | ORAL_TABLET | ORAL | Status: DC
  Administered 2013-04-15 – 2013-04-16 (×2): 750 mg via ORAL
  Filled 2013-04-15 (×6): qty 1

## 2013-04-15 NOTE — Tx Team (Signed)
  Interdisciplinary Treatment Plan Update   Date Reviewed:  04/15/2013  Time Reviewed:  2:49 PM  Progress in Treatment:   Attending groups: Yes Participating in groups: Yes Taking medication as prescribed: Yes  Tolerating medication: Yes Family/Significant other contact made: Yes  Patient understands diagnosis: Yes  Discussing patient identified problems/goals with staff: Yes Medical problems stabilized or resolved: Yes Denies suicidal/homicidal ideation: Yes Patient has not harmed self or others: Yes  For review of initial/current patient goals, please see plan of care.  Estimated Length of Stay:  Likely d/c tomorrow  Reason for Continuation of Hospitalization:   New Problems/Goals identified:  N/A  Discharge Plan or Barriers:   Return home, follow up [facility] outpt  Additional Comments:  Attendees:  Signature: Thedore Mins, MD 04/15/2013 2:49 PM   Signature: Richelle Ito, LCSW 04/15/2013 2:49 PM  Signature: Fransisca Kaufmann, NP 04/15/2013 2:49 PM  Signature: Joslyn Devon, RN 04/15/2013 2:49 PM  Signature: Liborio Nixon, RN 04/15/2013 2:49 PM  Signature:  04/15/2013 2:49 PM  Signature:   04/15/2013 2:49 PM  Signature:    Signature:    Signature:    Signature:    Signature:    Signature:      Scribe for Treatment Team:   Richelle Ito, LCSW  04/15/2013 2:49 PM

## 2013-04-15 NOTE — Progress Notes (Signed)
Adult Psychoeducational Group Note  Date:  04/15/2013 Time:  10:39 AM  Group Topic/Focus:  Leisure and lifestyle changes   Participation Level:  Active  Participation Quality:  Appropriate  Affect:  Appropriate  Cognitive:  Appropriate  Insight: Limited  Engagement in Group:  Improving  Modes of Intervention:  Discussion  Additional Comments:  Pt attended leisure and lifestyle group this morning. Today topic was stressand ways to cope with stress. Pt stated she likes to take bubble baths, play with her grandchildren, and spending time with her husband.   Angelyn Osterberg A 04/15/2013, 10:39 AM

## 2013-04-15 NOTE — Progress Notes (Signed)
[facility] Adult Case Management Discharge Plan :  Will you be returning to the same living situation after discharge: Yes,  home At discharge, do you have transportation home?:Yes,  husband Do you have the ability to pay for your medications:Yes,  insurance  Release of information consent forms completed and in the chart;  Patient's signature needed at discharge.  Patient to Follow up at: Follow-up Information   Follow up with [facility] On 04/20/2013. (Tuesday at 8:30AM with Dr Lolly Mustache   Also, contact the [employer] at [phone] to access free group and individual therapy appointments)    Contact information:   [address]  [facility]  [phone]      Patient denies SI/HI:   Yes,  yes    Safety Planning and Suicide Prevention discussed:  Yes,  yes  Ida Rogue 04/15/2013, 2:55 PM

## 2013-04-15 NOTE — BHH Group Notes (Signed)
BHH Group Notes:  (Counselor/Nursing/MHT/Case Management/Adjunct)  04/15/2013 1:15PM  Type of Therapy:  Group Therapy  Participation Level:  Active  Participation Quality:  Appropriate  Affect:  Flat  Cognitive:  Oriented  Insight:  Improving  Engagement in Group:  Limited  Engagement in Therapy:  Limited  Modes of Intervention:  Discussion, Exploration and Socialization  Summary of Progress/Problems: The topic for group was balance in life.  Pt participated in the discussion about when their life was in balance and out of balance and how this feels.  Pt discussed ways to get back in balance and short term goals they can work on to get where they want to be.  Danielle [name] begins by saying she had a siezure last night in the cafeteria.  Then states she is balanced today and looking forward to leaving tomorrow.  In my mind, there is a disconnect, but Danielle [name] assures me there is not.  "I have those seizures all the time.  And my husband is used to it.  It's no big deal" Furthermore, she knows she is balanced today "because my depression is down to a 2, and the voices are not there all the time."  States to move from unbalance to balance, she likes to take long, hot baths with a book and cigarettes.   [name] B 04/15/2013 2:26 PM

## 2013-04-15 NOTE — Progress Notes (Signed)
Patient ID: Danielle [name], female   DOB: Aug 25, 1970, 42 y.o.   MRN: [id number]  D: Patient pleasant and smiling some on approach. Reports that she feels well today. Gives depression and hopelessness "2" on scale. Currently denies any SI at present. Reports that she is suppose to go home tomorrow.  A: Staff will monitor on q 15 minute checks, follow treatment plan, and give meds as ordered. R: Cooperative on the unit.

## 2013-04-15 NOTE — Progress Notes (Signed)
Seen and agreed. Kena Limon, MD 

## 2013-04-15 NOTE — Progress Notes (Signed)
Patient ID: Danielle [name], female   DOB: 13-Mar-1971, 42 y.o.   MRN: [id number] [facility] MD Progress Note  04/15/2013 1:58 PM ELZABETH MCQUERRY  MRN:  [id number] Subjective:  Patient states "I'm not dizzy today. Last night when my husband visited he was telling me everything that was going on at home and I got really stressed. I think that is why I had the episode in the cafeteria. My daughter is going to court for stealing from [place] and there is nobody to watch the kids. I am barely hearing voices anymore. I feel ready to leave tomorrow."  Objective: Patient reports ongoing depressive symptoms, low energy level, feeling tired and lack of motivation. However, she reports decreased anxiety, mood lability, delusions and psychosis.  She is compliant with her medications and denies suicidal/homicidal ideation, intent or plan.  Diagnosis:   DSM5: Schizophrenia Disorders: Paranoid schizophrenia  Obsessive-Compulsive Disorders:  Trauma-Stressor Disorders: Posttraumatic Stress Disorder (309.81)  Substance/Addictive Disorders:  Depressive Disorders:  AXIS I: Schizophrenia, paranoid type  AXIS II: Deferred  AXIS III:  Past Medical History   Diagnosis  Date   .  Depressed    .  Seizures    .  Heart murmur    .  Hypothyroidism    .  Anemia    .  Headache(784.0)    .  Anxiety    .  PTSD (post-traumatic stress disorder)    .  Shortness of breath    .  Schizophrenia    .  Bipolar 1 disorder     AXIS IV: problems related to social environment and problems with primary support group  AXIS V: 41-50 serious symptoms  ADL's:  Intact  Sleep: fair  Appetite:  Good  Suicidal Ideation:  Passive SI no plan Homicidal Ideation:  Denies AEB (as evidenced by):  Psychiatric Specialty Exam: Review of Systems  Constitutional: Negative.   HENT: Negative.   Eyes: Negative.   Respiratory: Negative.   Cardiovascular: Negative.   Gastrointestinal: Negative.   Genitourinary: Positive for dysuria and  frequency.  Musculoskeletal: Positive for joint pain (Patient has right foot pain from previous fracture. ).  Skin: Negative.   Neurological: Negative.   Endo/Heme/Allergies: Negative.   Psychiatric/Behavioral: Positive for depression and hallucinations. Negative for memory loss and substance abuse. The patient does not have insomnia.     Blood pressure 119/79, pulse 98, temperature 97.3 F (36.3 C), temperature source Oral, resp. rate 20, height 5\' 11"  (1.803 m), weight 108.41 kg (239 lb), SpO2 99.00%.Body mass index is 33.35 kg/(m^2).  General Appearance: Casual  Eye Contact::  Good  Speech:  Clear and Coherent  Volume:  Normal  Mood:  Dysphoric  Affect:  Congruent  Thought Process:  Goal Directed and Intact  Orientation:  Full (Time, Place, and Person)  Thought Content:  Hallucinations: Auditory Visual, Paranoid Ideation and Rumination  Suicidal Thoughts:  Yes.  without intent/plan  Homicidal Thoughts:  No  Memory:  Immediate;   Good Recent;   Good Remote;   Good  Judgement:  Impaired  Insight:  Shallow  Psychomotor Activity:  Decreased  Concentration:  Fair  Recall:  Good  Akathisia:  No  Handed:  Right  AIMS (if indicated):     Assets:  Communication Skills Desire for Improvement Physical Health Resilience Social Support  Sleep:  Number of Hours: 6.25   Current Medications: Current Facility-Administered Medications  Medication Dose Route Frequency Provider Last Rate Last Dose  . acetaminophen (TYLENOL) tablet 650 mg  650  mg Oral Q6H PRN Kerry Hough, PA-C   650 mg at 04/13/13 2110  . alum & mag hydroxide-simeth (MAALOX/MYLANTA) 200-200-20 MG/5ML suspension 30 mL  30 mL Oral Q4H PRN Kerry Hough, PA-C      . buPROPion (WELLBUTRIN XL) 24 hr tablet 300 mg  300 mg Oral Daily Mojeed Akintayo   300 mg at 04/15/13 0755  . divalproex (DEPAKOTE ER) 24 hr tablet 750 mg  750 mg Oral BH-qamhs Mojeed Akintayo      . lamoTRIgine (LAMICTAL) tablet 150 mg  150 mg Oral QHS  Mojeed Akintayo   150 mg at 04/14/13 2109  . levothyroxine (SYNTHROID, LEVOTHROID) tablet 100 mcg  100 mcg Oral QAC breakfast Kerry Hough, PA-C   100 mcg at 04/15/13 1610  . magnesium hydroxide (MILK OF MAGNESIA) suspension 30 mL  30 mL Oral Daily PRN Kerry Hough, PA-C      . naproxen (NAPROSYN) tablet 250 mg  250 mg Oral BID WC Fransisca Kaufmann, NP   250 mg at 04/15/13 0756  . nitrofurantoin (MACRODANTIN) capsule 100 mg  100 mg Oral Q6H Fransisca Kaufmann, NP   100 mg at 04/15/13 1145  . OLANZapine zydis (ZYPREXA) disintegrating tablet 5 mg  5 mg Oral Q8H PRN Mojeed Akintayo   5 mg at 04/15/13 0139  . paliperidone (INVEGA) 24 hr tablet 9 mg  9 mg Oral Daily Mojeed Akintayo   9 mg at 04/15/13 0756  . traZODone (DESYREL) tablet 150 mg  150 mg Oral QHS Mojeed Akintayo   150 mg at 04/14/13 2110  . trihexyphenidyl (ARTANE) tablet 2 mg  2 mg Oral Daily Mojeed Akintayo   2 mg at 04/15/13 9604    Lab Results:  Results for orders placed during the hospital encounter of 04/06/13 (from the past 48 hour(s))  URINALYSIS, ROUTINE W REFLEX MICROSCOPIC     Status: Abnormal   Collection Time    04/13/13  9:19 PM      Result Value Range   Color, Urine YELLOW  YELLOW   APPearance CLOUDY (*) CLEAR   Specific Gravity, Urine 1.015  1.005 - 1.030   pH 7.0  5.0 - 8.0   Glucose, UA NEGATIVE  NEGATIVE mg/dL   Hgb urine dipstick SMALL (*) NEGATIVE   Bilirubin Urine NEGATIVE  NEGATIVE   Ketones, ur NEGATIVE  NEGATIVE mg/dL   Protein, ur NEGATIVE  NEGATIVE mg/dL   Urobilinogen, UA 0.2  0.0 - 1.0 mg/dL   Nitrite POSITIVE (*) NEGATIVE   Leukocytes, UA SMALL (*) NEGATIVE   Comment: Performed at [facility]  URINE MICROSCOPIC-ADD ON     Status: Abnormal   Collection Time    04/13/13  9:19 PM      Result Value Range   Squamous Epithelial / LPF RARE  RARE   WBC, UA 7-10  <3 WBC/hpf   RBC / HPF 0-2  <3 RBC/hpf   Bacteria, UA MANY (*) RARE   Comment: Performed at [facility]   URINE CULTURE     Status: None   Collection Time    04/13/13  9:19 PM      Result Value Range   Specimen Description       Value: URINE, RANDOM     Performed at [facility]   Special Requests       Value: NONE     Performed at [facility]   Culture  Setup Time  Value: 04/14/2013 12:37     Performed at [facility] Count       Value: >=100,000 COLONIES/ML     Performed at [facility]   Culture       Value: ESCHERICHIA COLI     Performed at [facility]   Report Status PENDING      Physical Findings: AIMS: Facial and Oral Movements Muscles of Facial Expression: None, normal Lips and Perioral Area: None, normal Jaw: None, normal Tongue: None, normal,Extremity Movements Upper (arms, wrists, hands, fingers): None, normal Lower (legs, knees, ankles, toes): None, normal, Trunk Movements Neck, shoulders, hips: None, normal, Overall Severity Severity of abnormal movements (highest score from questions above): None, normal Incapacitation due to abnormal movements: None, normal, Dental Status Current problems with teeth and/or dentures?: Yes Does patient usually wear dentures?: Yes (pt. does have with her "I lost them")  CIWA:    COWS:     Treatment Plan Summary: Daily contact with patient to assess and evaluate symptoms and progress in treatment Medication management  Plan: Continue crisis management and stabilization.  Medication management: Reviewed with patient who stated no untoward effects. Increase Depakote to 750 mg every am and hs, Lamictal 150 mg hs for improved mood stability and Invega to 9 mg Daily for psychosis. Continue Wellbutrin XR 300mg  po daily for depression. Encouraged patient to attend groups and participate in group counseling sessions and activities.  Discharge plan in progress. Anticipate d/c tomorrow.  Continue current treatment plan.  Address health issues: Vitals reviewed  and stable. Continue Macrodantin 100 mg QID for UTI.  Valproic acid level in am.   Medical Decision Making Problem Points:  Established problem,  improving (1) and Review of psycho-social stressors (1) Data Points:  Review of medication regiment & side effects (2)  I certify that inpatient services furnished can reasonably be expected to improve the patient's condition.   Fransisca Kaufmann, NP-C 04/15/2013, 1:58 PM

## 2013-04-16 LAB — URINE CULTURE

## 2013-04-16 LAB — VALPROIC ACID LEVEL: Valproic Acid Lvl: 70 ug/mL (ref 50.0–100.0)

## 2013-04-16 MED ORDER — TRAZODONE HCL 150 MG PO TABS: 150.0000 mg | ORAL_TABLET | Freq: Every day | ORAL | Status: DC

## 2013-04-16 MED ORDER — BUPROPION HCL ER (XL) 300 MG PO TB24: 300.0000 mg | ORAL_TABLET | Freq: Every day | ORAL | Status: DC

## 2013-04-16 MED ORDER — DIVALPROEX SODIUM ER 250 MG PO TB24: 750.0000 mg | ORAL_TABLET | ORAL | Status: DC

## 2013-04-16 MED ORDER — NITROFURANTOIN MACROCRYSTAL 100 MG PO CAPS: 100.0000 mg | ORAL_CAPSULE | Freq: Four times a day (QID) | ORAL | Status: DC

## 2013-04-16 MED ORDER — LAMOTRIGINE 150 MG PO TABS: 150.0000 mg | ORAL_TABLET | Freq: Every day | ORAL | Status: DC

## 2013-04-16 MED ORDER — LEVOTHYROXINE SODIUM 100 MCG PO TABS: 100.0000 ug | ORAL_TABLET | Freq: Every day | ORAL | Status: DC

## 2013-04-16 MED ORDER — PALIPERIDONE ER 9 MG PO TB24: 9.0000 mg | ORAL_TABLET | Freq: Every day | ORAL | Status: DC

## 2013-04-16 MED ORDER — DIVALPROEX SODIUM ER 250 MG PO TB24
750.0000 mg | ORAL_TABLET | ORAL | Status: DC
  Filled 2013-04-16 (×4): qty 18

## 2013-04-16 MED ORDER — TRIHEXYPHENIDYL HCL 2 MG PO TABS: 2.0000 mg | ORAL_TABLET | Freq: Every day | ORAL | Status: DC

## 2013-04-16 NOTE — Progress Notes (Signed)
Patient ID: Danielle [name], female   DOB: [birth year], 42 y.o.   MRN: [id number] D: Pt smiling reports "he told me I get to go home tomorrow" A: Writer encouraged pt. To attend group and report any concerns to me or staff. Staff will monitor q59min for safety. R: Pt is safe on the unit. Pt.does not attend group.

## 2013-04-16 NOTE — Progress Notes (Signed)
Patient ID: Danielle [name], female   DOB: 1970/05/08, 42 y.o.   MRN: [id number]  D: Patient reports ready for discharge. States depression improved and denies any SI.  A: Obtained all belongings, prescriptions, sample meds, and follow-up appointments. R: Patient's sister here to pick her up.

## 2013-04-16 NOTE — Progress Notes (Signed)
Seen and agreed. Jayan Raymundo, MD 

## 2013-04-16 NOTE — [facility] Suicide Risk Assessment (Signed)
Suicide Risk Assessment  Discharge Assessment     Demographic Factors:  Low socioeconomic status, Unemployed and female  Mental Status Per Nursing Assessment::   On Admission:  Suicidal ideation indicated by patient  Current Mental Status by Physician: patient denies suicidal indeation, intent or plan  Loss Factors: Financial problems/change in socioeconomic status  Historical Factors: Impulsivity  Risk Reduction Factors:   Sense of responsibility to family, Living with another person, especially a relative and Positive social support  Continued Clinical Symptoms:  Resolving delusions/psychosis and mood symptoms  Cognitive Features That Contribute To Risk:  Closed-mindedness Polarized thinking    Suicide Risk:  Minimal: No identifiable suicidal ideation.  Patients presenting with no risk factors but with morbid ruminations; may be classified as minimal risk based on the severity of the depressive symptoms  Discharge Diagnoses:   AXIS I:  Schizophrenia, paranoid type              Post traumatic stress disorder AXIS II:  Deferred AXIS III:   Past Medical History  Diagnosis Date  . Depressed   . Seizures   . Heart murmur   . Hypothyroidism   . Anemia   . Headache(784.0)   . Anxiety   . PTSD (post-traumatic stress disorder)   . Shortness of breath   . Schizophrenia   . Bipolar 1 disorder    AXIS IV:  economic problems, other psychosocial or environmental problems and problems related to social environment AXIS V:  61-70 mild symptoms  Plan Of Care/Follow-up recommendations:  Activity:  as tolerated Diet:  healthy Tests:  Depakote level: 70 Other:  patient to keep her after care appointment with Dr. Berton Mount  Is patient on multiple antipsychotic therapies at discharge:  No   Has Patient had three or more failed trials of antipsychotic monotherapy by history:  No  Recommended Plan for Multiple Antipsychotic Therapies: NA  Thedore Mins, MD 04/16/2013,  9:59 AM

## 2013-04-16 NOTE — Progress Notes (Signed)
Adult Psychoeducational Group Note  Date:  04/16/2013 Time:  11:34 AM  Group Topic/Focus:  Early Warning Signs:   The focus of this group is to help patients identify signs or symptoms they exhibit before slipping into an unhealthy state or crisis.  Participation Level:  Active  Participation Quality:  Attentive  Affect:  Flat  Cognitive:  Alert  Insight: Good  Engagement in Group:  Engaged  Modes of Intervention:  Activity, Discussion, Exploration, Socialization and Support  Additional Comments:  Pt came to group and participated and shared some of her experiences. Pt learned some of her early warning signs for relapse.   Cathlean Cower 04/16/2013, 11:34 AM

## 2013-04-16 NOTE — Discharge Summary (Signed)
Physician Discharge Summary Note  Patient:  Danielle [name] is an 42 y.o., female MRN:  [id number] DOB:  1970-10-15 Patient phone:  [phone] (home)  Patient address:   [address] [place] [address],   Date of Admission:  04/06/2013 Date of Discharge: 04/16/13  Reason for Admission:  Psychosis   Discharge Diagnoses: Principal Problem:   Schizophrenia, paranoid type Active Problems:   Post traumatic stress disorder (PTSD)   Mood disorder  Review of Systems  Constitutional: Negative.   HENT: Negative.   Eyes: Negative.   Respiratory: Negative.   Cardiovascular: Negative.   Gastrointestinal: Negative.   Genitourinary: Negative.   Musculoskeletal: Negative.   Skin: Negative.   Neurological: Negative.   Endo/Heme/Allergies: Negative.   Psychiatric/Behavioral: Positive for depression and hallucinations. Negative for suicidal ideas, memory loss and substance abuse. The patient is not nervous/anxious and does not have insomnia.     DSM5: Axis Diagnosis:   AXIS I: Schizophrenia, paranoid type  Post traumatic stress disorder  AXIS II: Deferred  AXIS III:  Past Medical History   Diagnosis  Date   .  Depressed    .  Seizures    .  Heart murmur    .  Hypothyroidism    .  Anemia    .  Headache(784.0)    .  Anxiety    .  PTSD (post-traumatic stress disorder)    .  Shortness of breath    .  Schizophrenia    .  Bipolar 1 disorder     AXIS IV: economic problems, other psychosocial or environmental problems and problems related to social environment  AXIS V: 61-70 mild symptoms  Level of Care:  OP  Hospital Course:  Danielle [name] is a 42 year old female well known to [employer] who presented to the [facility] with a family member reporting increased hallucinations and auditory hallucinations, stating that she was hearing the voices and they were telling her to kill herself. She is followed for her long history of paranoid schizophrenia by Dr. Lolly Mustache in the [facility] outpatient  clinic. Danielle [name] had 4 in patient admissions to [facility] in 2013. This will be her first admission to [facility] this year.         Danielle [name] was admitted to the adult unit where she was evaluated and her symptoms were identified. Medication management was discussed and implemented. She was encouraged to participate in unit programming. Medical problems were identified and treated appropriately. Home medication was restarted as needed.                Danielle [name] was evaluated each day by a clinical provider to ascertain the patient's response to treatment.  Improvement was noted by the patient's report of decreasing symptoms, improved sleep and appetite, affect, medication tolerance, behavior, and participation in unit programming.  Danielle [name] was asked each day to complete a self inventory noting mood, mental status, pain, new symptoms, anxiety and concerns.         She responded well to medication and being in a therapeutic and supportive environment. Positive and appropriate behavior was noted and the patient was motivated for recovery.  Danielle [name] worked closely with the treatment team and case manager to develop a discharge plan with appropriate goals. Coping skills, problem solving as well as relaxation therapies were also part of the unit programming.         By the day of discharge Danielle [name] was in much  improved condition than upon admission.  Symptoms were reported as significantly decreased or resolved completely.  The patient denied SI/HI and voiced no AVH. She was motivated to continue taking medication with a goal of continued improvement in mental health.          Danielle [name] was discharged home with a plan to follow up as noted below.  Consults:  None  Significant Diagnostic Studies:  labs: Admission labs completed.   Discharge Vitals:   Blood pressure 110/77, pulse 99, temperature 97.3 F (36.3 C), temperature source Oral, resp. rate 16, height 5\' 11"  (1.803 m), weight 108.41 kg  (239 lb), SpO2 99.00%. Body mass index is 33.35 kg/(m^2). Lab Results:   Results for orders placed during the hospital encounter of 04/06/13 (from the past 72 hour(s))  URINALYSIS, ROUTINE W REFLEX MICROSCOPIC     Status: Abnormal   Collection Time    04/13/13  9:19 PM      Result Value Range   Color, Urine YELLOW  YELLOW   APPearance CLOUDY (*) CLEAR   Specific Gravity, Urine 1.015  1.005 - 1.030   pH 7.0  5.0 - 8.0   Glucose, UA NEGATIVE  NEGATIVE mg/dL   Hgb urine dipstick SMALL (*) NEGATIVE   Bilirubin Urine NEGATIVE  NEGATIVE   Ketones, ur NEGATIVE  NEGATIVE mg/dL   Protein, ur NEGATIVE  NEGATIVE mg/dL   Urobilinogen, UA 0.2  0.0 - 1.0 mg/dL   Nitrite POSITIVE (*) NEGATIVE   Leukocytes, UA SMALL (*) NEGATIVE   Comment: Performed at [facility]  URINE MICROSCOPIC-ADD ON     Status: Abnormal   Collection Time    04/13/13  9:19 PM      Result Value Range   Squamous Epithelial / LPF RARE  RARE   WBC, UA 7-10  <3 WBC/hpf   RBC / HPF 0-2  <3 RBC/hpf   Bacteria, UA MANY (*) RARE   Comment: Performed at [facility]  URINE CULTURE     Status: None   Collection Time    04/13/13  9:19 PM      Result Value Range   Specimen Description       Value: URINE, RANDOM     Performed at [facility]   Special Requests       Value: NONE     Performed at [facility]   Culture  Setup Time       Value: 04/14/2013 12:37     Performed at [facility]   Colony Count       Value: >=100,000 COLONIES/ML     Performed at [facility]   Culture       Value: ESCHERICHIA COLI     Performed at [facility]   Report Status 04/16/2013 FINAL     Organism ID, Bacteria ESCHERICHIA COLI    VALPROIC ACID LEVEL     Status: None   Collection Time    04/16/13  6:28 AM      Result Value Range   Valproic Acid Lvl 70.0  50.0 - 100.0 ug/mL   Comment: Performed at [facility]    Physical  Findings: AIMS: Facial and Oral Movements Muscles of Facial Expression: None, normal Lips and Perioral Area: None, normal Jaw: None, normal Tongue: None, normal,Extremity Movements Upper (arms, wrists, hands, fingers): None, normal Lower (legs, knees, ankles, toes): None, normal, Trunk Movements Neck, shoulders, hips: None, normal, Overall Severity Severity  of abnormal movements (highest score from questions above): None, normal Incapacitation due to abnormal movements: None, normal Patient's awareness of abnormal movements (rate only patient's report): No Awareness, Dental Status Current problems with teeth and/or dentures?: Yes Does patient usually wear dentures?: Yes  CIWA:    COWS:     Psychiatric Specialty Exam: See Psychiatric Specialty Exam and Suicide Risk Assessment completed by Attending Physician prior to discharge.  Discharge destination:  Home  Is patient on multiple antipsychotic therapies at discharge:  No   Has Patient had three or more failed trials of antipsychotic monotherapy by history:  No  Recommended Plan for Multiple Antipsychotic Therapies: NA  Discharge Orders   Future Orders Complete By Expires   Discharge instructions  As directed    Comments:     Please follow up with your Primary Care Provider for management of further medical problems. If you also continue to have urinary symptoms such as burning after finishing antibiotics make an appointment with your MD.       Medication List    STOP taking these medications       haloperidol 5 MG tablet  Commonly known as:  HALDOL      TAKE these medications     Indication   buPROPion 300 MG 24 hr tablet  Commonly known as:  WELLBUTRIN XL  Take 1 tablet (300 mg total) by mouth daily.   Indication:  Depressive Phase of Manic-Depression     divalproex 250 MG 24 hr tablet  Commonly known as:  DEPAKOTE ER  Take 3 tablets (750 mg total) by mouth 2 (two) times daily in the am and at bedtime..    Indication:  Complex Partial Epilepsy, Manic Phase of Manic-Depression     lamoTRIgine 150 MG tablet  Commonly known as:  LAMICTAL  Take 1 tablet (150 mg total) by mouth at bedtime.   Indication:  Depression     levothyroxine 100 MCG tablet  Commonly known as:  SYNTHROID, LEVOTHROID  Take 1 tablet (100 mcg total) by mouth daily before breakfast.   Indication:  Underactive Thyroid     nitrofurantoin 100 MG capsule  Commonly known as:  MACRODANTIN  Take 1 capsule (100 mg total) by mouth every 6 (six) hours. Take one capsule every six hours until finished for UTI.   Indication:  Urinary Tract Infection     paliperidone 9 MG 24 hr tablet  Commonly known as:  INVEGA  Take 1 tablet (9 mg total) by mouth daily.   Indication:  Schizophrenia     traZODone 150 MG tablet  Commonly known as:  DESYREL  Take 1 tablet (150 mg total) by mouth at bedtime.   Indication:  Trouble Sleeping     trihexyphenidyl 2 MG tablet  Commonly known as:  ARTANE  Take 1 tablet (2 mg total) by mouth daily.   Indication:  Extrapyramidal Reaction caused by Medications           Follow-up Information   Follow up with [facility] On 04/20/2013. (Tuesday at 8:30AM with Dr Lolly Mustache   Also, contact the [employer] at [phone] to access free group and individual therapy appointments)    Contact information:   [address]  [facility]  [phone]      Follow-up recommendations:   Activity: as tolerated  Diet: healthy  Tests: Depakote level: 70  Other: patient to keep her after care appointment with Dr. Lolly Mustache   Comments:  Take all your medications as prescribed by your mental healthcare provider.  Report any adverse effects and or reactions from your medicines to your outpatient provider promptly.  Patient is instructed and cautioned to not engage in alcohol and or illegal drug use while on prescription medicines.  In the event of worsening symptoms, patient is  instructed to call the crisis hotline, 911 and or go to the nearest ED for appropriate evaluation and treatment of symptoms.  Follow-up with your primary care provider for your other medical issues, concerns and or health care needs.   Total Discharge Time:  Greater than 30 minutes.  SignedFransisca Kaufmann NP-C 04/16/2013, 10:07 AM

## 2013-04-20 ENCOUNTER — Telehealth (HOSPITAL_COMMUNITY): Payer: Self-pay

## 2013-04-20 ENCOUNTER — Ambulatory Visit (HOSPITAL_COMMUNITY): Payer: Self-pay | Admitting: Psychiatry

## 2013-04-20 NOTE — Telephone Encounter (Signed)
Husband states Invega ER  is $70 and cannot afford. Husband wants to know what affect it will have to be off medicine. She won't be on this medicine.

## 2013-04-20 NOTE — Discharge Summary (Signed)
Seen and agreed. Danielle Loseke, MD 

## 2013-04-21 NOTE — Progress Notes (Signed)
Patient Discharge Instructions:  Next Level Care Provider Has Access to the EMR, 04/21/13 Records provided to [facility] via [facility]/Epic access.  Jerelene Redden, 04/21/2013, 2:19 PM

## 2013-05-01 ENCOUNTER — Other Ambulatory Visit (HOSPITAL_COMMUNITY): Payer: Self-pay | Admitting: Psychiatry

## 2013-05-04 ENCOUNTER — Ambulatory Visit (INDEPENDENT_AMBULATORY_CARE_PROVIDER_SITE_OTHER): Payer: BC Managed Care – PPO | Admitting: Psychiatry

## 2013-05-04 ENCOUNTER — Encounter (HOSPITAL_COMMUNITY): Payer: Self-pay | Admitting: Psychiatry

## 2013-05-04 VITALS — BP 98/73 | HR 65 | Wt 228.0 lb

## 2013-05-04 DIAGNOSIS — F200 Paranoid schizophrenia: Secondary | ICD-10-CM

## 2013-05-04 DIAGNOSIS — F4310 Post-traumatic stress disorder, unspecified: Secondary | ICD-10-CM

## 2013-05-04 MED ORDER — TRAZODONE HCL 150 MG PO TABS: 150.0000 mg | ORAL_TABLET | Freq: Every day | ORAL | Status: DC

## 2013-05-04 MED ORDER — TRIHEXYPHENIDYL HCL 2 MG PO TABS: 2.0000 mg | ORAL_TABLET | Freq: Every day | ORAL | Status: DC

## 2013-05-04 MED ORDER — LAMOTRIGINE 150 MG PO TABS: 150.0000 mg | ORAL_TABLET | Freq: Every day | ORAL | Status: DC

## 2013-05-04 MED ORDER — BUPROPION HCL ER (XL) 300 MG PO TB24: 300.0000 mg | ORAL_TABLET | Freq: Every day | ORAL | Status: DC

## 2013-05-04 NOTE — Telephone Encounter (Signed)
Patient has refill. She is using half tablet
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
## 2013-05-07 ENCOUNTER — Ambulatory Visit (HOSPITAL_COMMUNITY): Payer: Self-pay | Admitting: Psychiatry

## 2013-06-01 ENCOUNTER — Ambulatory Visit (INDEPENDENT_AMBULATORY_CARE_PROVIDER_SITE_OTHER): Payer: BC Managed Care – PPO | Admitting: Psychiatry

## 2013-06-01 ENCOUNTER — Encounter (HOSPITAL_COMMUNITY): Payer: Self-pay | Admitting: Psychiatry

## 2013-06-01 VITALS — BP 95/65 | HR 75

## 2013-06-01 DIAGNOSIS — F200 Paranoid schizophrenia: Secondary | ICD-10-CM

## 2013-06-01 MED ORDER — TRIHEXYPHENIDYL HCL 2 MG PO TABS: 2.0000 mg | ORAL_TABLET | Freq: Every day | ORAL | Status: DC

## 2013-06-01 MED ORDER — TRAZODONE HCL 150 MG PO TABS: 150.0000 mg | ORAL_TABLET | Freq: Every day | ORAL | Status: DC

## 2013-06-01 MED ORDER — BUPROPION HCL ER (XL) 300 MG PO TB24: 300.0000 mg | ORAL_TABLET | Freq: Every day | ORAL | Status: DC
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
## 2013-06-18 ENCOUNTER — Other Ambulatory Visit (HOSPITAL_COMMUNITY): Payer: Self-pay | Admitting: Psychiatry

## 2013-06-20 ENCOUNTER — Other Ambulatory Visit (HOSPITAL_COMMUNITY): Payer: Self-pay | Admitting: Psychiatry

## 2013-07-26 ENCOUNTER — Other Ambulatory Visit (HOSPITAL_COMMUNITY): Payer: Self-pay | Admitting: Psychiatry

## 2013-07-26 DIAGNOSIS — F200 Paranoid schizophrenia: Secondary | ICD-10-CM

## 2013-07-26 NOTE — Telephone Encounter (Signed)
Spoke with Dr. Lolly MustacheArfeen, who gave approval for refills. Pt has appointment 08/02/13.

## 2013-08-02 ENCOUNTER — Ambulatory Visit (HOSPITAL_COMMUNITY): Payer: Self-pay | Admitting: Psychiatry

## 2013-08-03 ENCOUNTER — Encounter (HOSPITAL_COMMUNITY): Payer: Self-pay | Admitting: Psychiatry

## 2013-08-03 ENCOUNTER — Ambulatory Visit (INDEPENDENT_AMBULATORY_CARE_PROVIDER_SITE_OTHER): Payer: BC Managed Care – PPO | Admitting: Psychiatry

## 2013-08-03 VITALS — BP 110/72 | HR 74 | Ht 71.0 in | Wt 196.0 lb

## 2013-08-03 DIAGNOSIS — F200 Paranoid schizophrenia: Secondary | ICD-10-CM

## 2013-08-03 DIAGNOSIS — F4310 Post-traumatic stress disorder, unspecified: Secondary | ICD-10-CM

## 2013-08-03 MED ORDER — TRAZODONE HCL 150 MG PO TABS: 150.0000 mg | ORAL_TABLET | Freq: Every day | ORAL | Status: DC

## 2013-08-03 MED ORDER — DIVALPROEX SODIUM ER 250 MG PO TB24: 750.0000 mg | ORAL_TABLET | ORAL | Status: DC

## 2013-08-03 MED ORDER — TRIHEXYPHENIDYL HCL 2 MG PO TABS: ORAL_TABLET | ORAL | Status: DC

## 2013-08-03 MED ORDER — LAMOTRIGINE 150 MG PO TABS: ORAL_TABLET | ORAL | Status: DC

## 2013-08-03 MED ORDER — BUPROPION HCL ER (XL) 300 MG PO TB24: ORAL_TABLET | ORAL | Status: DC

## 2013-08-03 MED ORDER — HALOPERIDOL 5 MG PO TABS: 5.0000 mg | ORAL_TABLET | Freq: Every day | ORAL | Status: DC
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
## 2013-10-19 ENCOUNTER — Ambulatory Visit (INDEPENDENT_AMBULATORY_CARE_PROVIDER_SITE_OTHER): Payer: BC Managed Care – PPO | Admitting: Psychiatry

## 2013-10-19 ENCOUNTER — Encounter (HOSPITAL_COMMUNITY): Payer: Self-pay | Admitting: Psychiatry

## 2013-10-19 VITALS — BP 110/73 | HR 67 | Ht 69.5 in | Wt 184.6 lb

## 2013-10-19 DIAGNOSIS — F200 Paranoid schizophrenia: Secondary | ICD-10-CM

## 2013-10-19 MED ORDER — LAMOTRIGINE 150 MG PO TABS: ORAL_TABLET | ORAL | Status: DC

## 2013-10-19 MED ORDER — DIVALPROEX SODIUM ER 500 MG PO TB24: 500.0000 mg | ORAL_TABLET | Freq: Two times a day (BID) | ORAL | Status: DC

## 2013-10-19 MED ORDER — TRIHEXYPHENIDYL HCL 2 MG PO TABS: ORAL_TABLET | ORAL | Status: DC

## 2013-10-19 MED ORDER — TRAZODONE HCL 150 MG PO TABS: 150.0000 mg | ORAL_TABLET | Freq: Every day | ORAL | Status: DC

## 2013-10-19 MED ORDER — HALOPERIDOL 5 MG PO TABS: 5.0000 mg | ORAL_TABLET | Freq: Two times a day (BID) | ORAL | Status: DC
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
## 2013-11-03 ENCOUNTER — Ambulatory Visit (HOSPITAL_COMMUNITY): Payer: Self-pay | Admitting: Psychiatry

## 2013-11-22 ENCOUNTER — Other Ambulatory Visit (HOSPITAL_COMMUNITY): Payer: Self-pay | Admitting: Psychiatry

## 2013-11-23 ENCOUNTER — Encounter (HOSPITAL_COMMUNITY): Payer: Self-pay | Admitting: Psychiatry

## 2013-11-23 ENCOUNTER — Ambulatory Visit (INDEPENDENT_AMBULATORY_CARE_PROVIDER_SITE_OTHER): Payer: BC Managed Care – PPO | Admitting: Psychiatry

## 2013-11-23 VITALS — BP 96/58 | HR 63 | Ht 69.5 in | Wt 189.2 lb

## 2013-11-23 DIAGNOSIS — F200 Paranoid schizophrenia: Secondary | ICD-10-CM

## 2013-11-23 MED ORDER — HALOPERIDOL 5 MG PO TABS: 5.0000 mg | ORAL_TABLET | Freq: Two times a day (BID) | ORAL | Status: DC

## 2013-11-23 MED ORDER — LAMOTRIGINE 150 MG PO TABS: ORAL_TABLET | ORAL | Status: DC

## 2013-11-23 MED ORDER — TRIHEXYPHENIDYL HCL 2 MG PO TABS: ORAL_TABLET | ORAL | Status: DC

## 2013-11-23 MED ORDER — TRAZODONE HCL 150 MG PO TABS: 150.0000 mg | ORAL_TABLET | Freq: Every day | ORAL | Status: DC
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
## 2013-12-11 ENCOUNTER — Other Ambulatory Visit (HOSPITAL_COMMUNITY): Payer: Self-pay | Admitting: Psychiatry

## 2013-12-11 DIAGNOSIS — F200 Paranoid schizophrenia: Secondary | ICD-10-CM

## 2013-12-11 MED ORDER — DIVALPROEX SODIUM ER 500 MG PO TB24: 500.0000 mg | ORAL_TABLET | Freq: Two times a day (BID) | ORAL | Status: DC

## 2013-12-14 ENCOUNTER — Other Ambulatory Visit (HOSPITAL_COMMUNITY): Payer: Self-pay | Admitting: Psychiatry

## 2014-01-19 ENCOUNTER — Telehealth (HOSPITAL_COMMUNITY): Payer: Self-pay

## 2014-01-20 ENCOUNTER — Other Ambulatory Visit (HOSPITAL_COMMUNITY): Payer: Self-pay | Admitting: *Deleted

## 2014-01-20 ENCOUNTER — Other Ambulatory Visit (HOSPITAL_COMMUNITY): Payer: Self-pay | Admitting: Psychiatry

## 2014-01-20 DIAGNOSIS — F200 Paranoid schizophrenia: Secondary | ICD-10-CM

## 2014-01-20 MED ORDER — HALOPERIDOL 5 MG PO TABS: 5.0000 mg | ORAL_TABLET | Freq: Two times a day (BID) | ORAL | Status: DC

## 2014-01-24 ENCOUNTER — Ambulatory Visit (HOSPITAL_COMMUNITY): Payer: Self-pay | Admitting: Psychiatry

## 2014-02-08 ENCOUNTER — Ambulatory Visit (INDEPENDENT_AMBULATORY_CARE_PROVIDER_SITE_OTHER): Payer: BC Managed Care – PPO | Admitting: Psychiatry

## 2014-02-08 ENCOUNTER — Encounter (HOSPITAL_COMMUNITY): Payer: Self-pay | Admitting: Psychiatry

## 2014-02-08 VITALS — BP 117/72 | HR 69 | Ht 70.0 in | Wt 218.8 lb

## 2014-02-08 DIAGNOSIS — F200 Paranoid schizophrenia: Secondary | ICD-10-CM

## 2014-02-08 DIAGNOSIS — F4310 Post-traumatic stress disorder, unspecified: Secondary | ICD-10-CM

## 2014-02-08 MED ORDER — HALOPERIDOL 5 MG PO TABS: ORAL_TABLET | ORAL | Status: DC

## 2014-02-08 MED ORDER — LAMOTRIGINE 150 MG PO TABS: ORAL_TABLET | ORAL | Status: DC

## 2014-02-08 MED ORDER — TRAZODONE HCL 150 MG PO TABS: 150.0000 mg | ORAL_TABLET | Freq: Every day | ORAL | Status: DC

## 2014-02-08 MED ORDER — DIVALPROEX SODIUM ER 500 MG PO TB24: 500.0000 mg | ORAL_TABLET | Freq: Two times a day (BID) | ORAL | Status: DC

## 2014-02-08 MED ORDER — HYDROXYZINE HCL 50 MG PO TABS: 50.0000 mg | ORAL_TABLET | Freq: Every day | ORAL | Status: DC
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
## 2014-03-04 ENCOUNTER — Other Ambulatory Visit (HOSPITAL_COMMUNITY): Payer: Self-pay | Admitting: Psychiatry

## 2014-03-11 ENCOUNTER — Encounter (HOSPITAL_COMMUNITY): Payer: Self-pay | Admitting: Psychiatry

## 2014-03-11 ENCOUNTER — Ambulatory Visit (INDEPENDENT_AMBULATORY_CARE_PROVIDER_SITE_OTHER): Payer: BC Managed Care – PPO | Admitting: Psychiatry

## 2014-03-11 VITALS — BP 117/74 | HR 76 | Ht 71.0 in | Wt 203.6 lb

## 2014-03-11 DIAGNOSIS — F200 Paranoid schizophrenia: Secondary | ICD-10-CM

## 2014-03-11 DIAGNOSIS — F4310 Post-traumatic stress disorder, unspecified: Secondary | ICD-10-CM

## 2014-03-11 MED ORDER — DIVALPROEX SODIUM ER 500 MG PO TB24: 500.0000 mg | ORAL_TABLET | Freq: Two times a day (BID) | ORAL | Status: DC

## 2014-03-11 MED ORDER — HALOPERIDOL 5 MG PO TABS: ORAL_TABLET | ORAL | Status: DC

## 2014-03-11 MED ORDER — HYDROXYZINE HCL 50 MG PO TABS: 50.0000 mg | ORAL_TABLET | Freq: Every day | ORAL | Status: DC

## 2014-03-11 MED ORDER — TRAZODONE HCL 150 MG PO TABS: 150.0000 mg | ORAL_TABLET | Freq: Every day | ORAL | Status: DC

## 2014-03-11 MED ORDER — LAMOTRIGINE 150 MG PO TABS
ORAL_TABLET | ORAL | Status: DC
Start: 2014-03-11 — End: 2014-05-04
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
## 2014-04-08 ENCOUNTER — Encounter (HOSPITAL_COMMUNITY): Payer: Self-pay | Admitting: Psychiatry

## 2014-04-08 ENCOUNTER — Ambulatory Visit (INDEPENDENT_AMBULATORY_CARE_PROVIDER_SITE_OTHER): Payer: BC Managed Care – PPO | Admitting: Psychiatry

## 2014-04-08 VITALS — BP 130/72 | HR 65 | Ht 71.0 in | Wt 200.6 lb

## 2014-04-08 DIAGNOSIS — F200 Paranoid schizophrenia: Secondary | ICD-10-CM

## 2014-04-08 DIAGNOSIS — F4310 Post-traumatic stress disorder, unspecified: Secondary | ICD-10-CM

## 2014-04-08 MED ORDER — HYDROXYZINE HCL 50 MG PO TABS: 50.0000 mg | ORAL_TABLET | Freq: Every day | ORAL | Status: DC

## 2014-04-08 MED ORDER — DIVALPROEX SODIUM ER 500 MG PO TB24: 500.0000 mg | ORAL_TABLET | Freq: Two times a day (BID) | ORAL | Status: DC

## 2014-04-08 MED ORDER — HALOPERIDOL 5 MG PO TABS: ORAL_TABLET | ORAL | Status: DC

## 2014-04-08 MED ORDER — TRAZODONE HCL 100 MG PO TABS
100.0000 mg | ORAL_TABLET | Freq: Every day | ORAL | Status: DC
Start: 2014-04-08 — End: 2014-05-13
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
## 2014-04-13 ENCOUNTER — Encounter (HOSPITAL_COMMUNITY): Payer: Self-pay | Admitting: Clinical

## 2014-04-13 ENCOUNTER — Ambulatory Visit (INDEPENDENT_AMBULATORY_CARE_PROVIDER_SITE_OTHER): Payer: BC Managed Care – PPO | Admitting: Clinical

## 2014-04-13 DIAGNOSIS — F209 Schizophrenia, unspecified: Secondary | ICD-10-CM

## 2014-04-13 DIAGNOSIS — F200 Paranoid schizophrenia: Secondary | ICD-10-CM

## 2014-04-13 DIAGNOSIS — F4310 Post-traumatic stress disorder, unspecified: Secondary | ICD-10-CM
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
## 2014-04-20 ENCOUNTER — Ambulatory Visit (INDEPENDENT_AMBULATORY_CARE_PROVIDER_SITE_OTHER): Payer: BC Managed Care – PPO | Admitting: Clinical

## 2014-04-20 DIAGNOSIS — F4310 Post-traumatic stress disorder, unspecified: Secondary | ICD-10-CM

## 2014-04-20 DIAGNOSIS — F200 Paranoid schizophrenia: Secondary | ICD-10-CM | POA: Diagnosis not present

## 2014-04-20 NOTE — Progress Notes (Signed)
   THERAPIST PROGRESS NOTE  Session Time: 50 min 10:05 - 10:55  Participation Level: Active  Behavioral Response: DisheveledDrowsyNA and Depressed  Type of Therapy: Individual Therapy  Treatment Goals addressed: Coping and Diagnosis: Schizophria symptoms  Interventions: Motivational Interviewing and Other: Grounding techniques  Summary: Danielle [name] is a 43 y.o. female who presents with schizophrenia .   Suicidal/Homicidal: Nowithout intent/plan  Therapist Response:  Danielle [name] accompanied by her Danielle [name] met with clinician for an individual session. Danielle [name] shared about her week's events and her mental health symptoms. Danielle [name] shared that she has been having a difficult time lately because of the voices. Danielle [name] shared that she had been home a lot and that she tries to stay on his sleep schedule. He works 2pm - 2 am. Horticulturist, commercial discussed her desire to change her thought patterns.Clinician introduced and explained grounding techniques, what they are, the purpose of them, and how to use them. Danielle [name], Danielle [name], and clinician clinician practiced the techniques together.Danielle [name] and Danielle [name] asked clarifying questions which clinician answered. Danielle [name] and Danielle [name] agreed to practice the grounding techniques (2)  every day until next session.   ** Danielle [name] went out ( sleep state) twice durring session and Danielle [name] re awoke her. He shared that this is a common behavior for her. She was alert the rest of the session.   Clinician recommended that they look into [facility] as a possibility for sociallization  Plan: Return again in 1 week.  Diagnosis: Axis I: Schizophrenia and PTSD       Isela Stantz A, LCSW 04/20/2014

## 2014-04-21 ENCOUNTER — Encounter (HOSPITAL_COMMUNITY): Payer: Self-pay | Admitting: Clinical

## 2014-05-02 ENCOUNTER — Encounter (HOSPITAL_COMMUNITY): Payer: Self-pay | Admitting: Clinical

## 2014-05-02 ENCOUNTER — Ambulatory Visit (INDEPENDENT_AMBULATORY_CARE_PROVIDER_SITE_OTHER): Payer: BC Managed Care – PPO | Admitting: Clinical

## 2014-05-02 ENCOUNTER — Other Ambulatory Visit (HOSPITAL_COMMUNITY): Payer: Self-pay | Admitting: Psychiatry

## 2014-05-02 DIAGNOSIS — F4310 Post-traumatic stress disorder, unspecified: Secondary | ICD-10-CM

## 2014-05-02 DIAGNOSIS — F200 Paranoid schizophrenia: Secondary | ICD-10-CM
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
## 2014-05-04 ENCOUNTER — Other Ambulatory Visit (HOSPITAL_COMMUNITY): Payer: Self-pay | Admitting: Psychiatry

## 2014-05-04 DIAGNOSIS — F200 Paranoid schizophrenia: Secondary | ICD-10-CM

## 2014-05-04 MED ORDER — LAMOTRIGINE 150 MG PO TABS: ORAL_TABLET | ORAL | Status: DC

## 2014-05-09 ENCOUNTER — Ambulatory Visit (INDEPENDENT_AMBULATORY_CARE_PROVIDER_SITE_OTHER): Payer: BLUE CROSS/BLUE SHIELD | Admitting: Clinical

## 2014-05-09 DIAGNOSIS — F4310 Post-traumatic stress disorder, unspecified: Secondary | ICD-10-CM | POA: Diagnosis not present

## 2014-05-09 DIAGNOSIS — F200 Paranoid schizophrenia: Secondary | ICD-10-CM

## 2014-05-10 ENCOUNTER — Encounter (HOSPITAL_COMMUNITY): Payer: Self-pay | Admitting: Clinical

## 2014-05-10 NOTE — Progress Notes (Signed)
   THERAPIST PROGRESS NOTE  Session Time: 11:00- 12:00  Participation Level: Active  Behavioral Response: CasualAlertAnxious and calm and happy at times  Type of Therapy: Individual Therapy  Treatment Goals addressed: Anxiety and Diagnosis: Schizophrenia, emotional regulation, calming skills and healthy coping skills  Interventions: Motivational Interviewing, Strength-based and Other: Grounding techniques  Summary: Danielle [name] is a 43 y.o. female who presents with PTSD and Schizophrenia.   Suicidal/Homicidal: Nowithout intent/plan  Therapist Response:  Danielle [name] met with clinician for an individual sessions. She discussed her week's events and her psychiatric symptoms. Danielle [name] shared that her symptoms were worse after the last session. Client and clinician agreed to work on grounding more. Client and clinician discussed her symptoms. She shared that she has been experiencing more visual and auditory hallucinations. Danielle [name] and clinician discussed her medications and Danielle [name] shared that she was seeing her psychiatrist on Friday and would talk to the doctor about the possibility of adjusting her medications. Danielle [name] shared that she was having a lot of fear about her husband [name] returning to work . She shared that the voices and shadow people are much worse when he is gone. Danielle [name] shared that the grounding techniques were helping but not able to fully stop a panic attack. Client and clinician discussed the times and places she does feel most grounded. Client and clinician explored ways she could use this and additional grounding techniques to improve her anxiety. Danielle [name] formulated and agreed to practice a safety statement in conjunction with other grounding techniques until next session.    Plan: Return again in 1 week.  Diagnosis: Axis I: PTSD and schizophrenia        , A, LCSW 05/10/2014   

## 2014-05-13 ENCOUNTER — Encounter (HOSPITAL_COMMUNITY): Payer: Self-pay | Admitting: Psychiatry

## 2014-05-13 ENCOUNTER — Ambulatory Visit (INDEPENDENT_AMBULATORY_CARE_PROVIDER_SITE_OTHER): Payer: BLUE CROSS/BLUE SHIELD | Admitting: Psychiatry

## 2014-05-13 VITALS — BP 94/67 | HR 74 | Wt 199.0 lb

## 2014-05-13 DIAGNOSIS — F200 Paranoid schizophrenia: Secondary | ICD-10-CM

## 2014-05-13 DIAGNOSIS — F4310 Post-traumatic stress disorder, unspecified: Secondary | ICD-10-CM

## 2014-05-13 MED ORDER — HALOPERIDOL 5 MG PO TABS: ORAL_TABLET | ORAL | Status: DC

## 2014-05-13 MED ORDER — HYDROXYZINE HCL 50 MG PO TABS: 50.0000 mg | ORAL_TABLET | Freq: Every day | ORAL | Status: DC

## 2014-05-13 MED ORDER — LAMOTRIGINE 200 MG PO TABS: 200.0000 mg | ORAL_TABLET | Freq: Every day | ORAL | Status: DC

## 2014-05-13 MED ORDER — DIVALPROEX SODIUM ER 500 MG PO TB24: ORAL_TABLET | ORAL | Status: DC

## 2014-05-13 MED ORDER — TRAZODONE HCL 100 MG PO TABS: 100.0000 mg | ORAL_TABLET | Freq: Every day | ORAL | Status: DC
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
## 2014-05-17 ENCOUNTER — Other Ambulatory Visit (HOSPITAL_COMMUNITY): Payer: Self-pay | Admitting: Psychiatry

## 2014-05-18 ENCOUNTER — Emergency Department (HOSPITAL_COMMUNITY)
Admission: EM | Admit: 2014-05-18 | Discharge: 2014-05-20 | Disposition: A | Discharge status: Home / Self Care | Payer: BLUE CROSS/BLUE SHIELD | Attending: Emergency Medicine | Admitting: Emergency Medicine

## 2014-05-18 ENCOUNTER — Ambulatory Visit (HOSPITAL_COMMUNITY)
Admission: RE | Admit: 2014-05-18 | Discharge: 2014-05-18 | Disposition: A | Discharge status: Home / Self Care | Payer: BLUE CROSS/BLUE SHIELD | Attending: Psychiatry | Admitting: Psychiatry

## 2014-05-18 ENCOUNTER — Encounter (HOSPITAL_COMMUNITY): Payer: Self-pay | Admitting: Emergency Medicine

## 2014-05-18 ENCOUNTER — Ambulatory Visit (INDEPENDENT_AMBULATORY_CARE_PROVIDER_SITE_OTHER): Payer: BLUE CROSS/BLUE SHIELD | Admitting: Clinical

## 2014-05-18 ENCOUNTER — Encounter (HOSPITAL_COMMUNITY): Payer: Self-pay | Admitting: Clinical

## 2014-05-18 DIAGNOSIS — E039 Hypothyroidism, unspecified: Secondary | ICD-10-CM | POA: Diagnosis not present

## 2014-05-18 DIAGNOSIS — Z87891 Personal history of nicotine dependence: Secondary | ICD-10-CM | POA: Diagnosis not present

## 2014-05-18 DIAGNOSIS — F209 Schizophrenia, unspecified: Secondary | ICD-10-CM | POA: Diagnosis not present

## 2014-05-18 DIAGNOSIS — F200 Paranoid schizophrenia: Secondary | ICD-10-CM | POA: Diagnosis not present

## 2014-05-18 DIAGNOSIS — I959 Hypotension, unspecified: Secondary | ICD-10-CM | POA: Diagnosis not present

## 2014-05-18 DIAGNOSIS — R011 Cardiac murmur, unspecified: Secondary | ICD-10-CM | POA: Insufficient documentation

## 2014-05-18 DIAGNOSIS — F419 Anxiety disorder, unspecified: Secondary | ICD-10-CM | POA: Insufficient documentation

## 2014-05-18 DIAGNOSIS — Z79899 Other long term (current) drug therapy: Secondary | ICD-10-CM | POA: Insufficient documentation

## 2014-05-18 DIAGNOSIS — Z7982 Long term (current) use of aspirin: Secondary | ICD-10-CM | POA: Diagnosis not present

## 2014-05-18 DIAGNOSIS — F4310 Post-traumatic stress disorder, unspecified: Secondary | ICD-10-CM | POA: Diagnosis not present

## 2014-05-18 DIAGNOSIS — G40909 Epilepsy, unspecified, not intractable, without status epilepticus: Secondary | ICD-10-CM | POA: Insufficient documentation

## 2014-05-18 DIAGNOSIS — F319 Bipolar disorder, unspecified: Secondary | ICD-10-CM | POA: Insufficient documentation

## 2014-05-18 DIAGNOSIS — Z008 Encounter for other general examination: Secondary | ICD-10-CM | POA: Diagnosis present

## 2014-05-18 LAB — CBC
HCT: 36.3 % (ref 36.0–46.0)
Hemoglobin: 12.2 g/dL (ref 12.0–15.0)
MCH: 31 pg (ref 26.0–34.0)
MCHC: 33.6 g/dL (ref 30.0–36.0)
MCV: 92.1 fL (ref 78.0–100.0)
Platelets: 237 10*3/uL (ref 150–400)
RBC: 3.94 MIL/uL (ref 3.87–5.11)
RDW: 13 % (ref 11.5–15.5)
WBC: 5.9 10*3/uL (ref 4.0–10.5)

## 2014-05-18 LAB — SALICYLATE LEVEL: Salicylate Lvl: <4 mg/dL (ref 2.8–20.0)

## 2014-05-18 LAB — RAPID URINE DRUG SCREEN, HOSP PERFORMED
Amphetamines: NOT DETECTED
BENZODIAZEPINES: NOT DETECTED
Barbiturates: NOT DETECTED
COCAINE: NOT DETECTED
Opiates: NOT DETECTED
Tetrahydrocannabinol: NOT DETECTED

## 2014-05-18 LAB — COMPREHENSIVE METABOLIC PANEL
ALK PHOS: 63 U/L (ref 39–117)
ALT: 11 U/L (ref 0–35)
ANION GAP: 8 (ref 5–15)
AST: 15 U/L (ref 0–37)
Albumin: 4.1 g/dL (ref 3.5–5.2)
BILIRUBIN TOTAL: 0.3 mg/dL (ref 0.3–1.2)
BUN: 21 mg/dL (ref 6–23)
CHLORIDE: 108 meq/L (ref 96–112)
CO2: 20 mmol/L (ref 19–32)
Calcium: 9.1 mg/dL (ref 8.4–10.5)
Creatinine, Ser: 0.87 mg/dL (ref 0.50–1.10)
GFR, EST NON AFRICAN AMERICAN: 79 mL/min — AB (ref >90)
Glucose, Bld: 103 mg/dL — ABNORMAL HIGH (ref 70–99)
Potassium: 4.2 mmol/L (ref 3.5–5.1)
Sodium: 136 mmol/L (ref 135–145)
Total Protein: 7.1 g/dL (ref 6.0–8.3)

## 2014-05-18 LAB — ETHANOL

## 2014-05-18 LAB — URINE MICROSCOPIC-ADD ON

## 2014-05-18 LAB — URINALYSIS, ROUTINE W REFLEX MICROSCOPIC
BILIRUBIN URINE: NEGATIVE
Glucose, UA: NEGATIVE mg/dL
Ketones, ur: NEGATIVE mg/dL
NITRITE: NEGATIVE
Protein, ur: NEGATIVE mg/dL
SPECIFIC GRAVITY, URINE: 1.016 (ref 1.005–1.030)
UROBILINOGEN UA: 0.2 mg/dL (ref 0.0–1.0)
pH: 7 (ref 5.0–8.0)

## 2014-05-18 LAB — ACETAMINOPHEN LEVEL

## 2014-05-18 MED ORDER — TRAZODONE HCL 100 MG PO TABS
100.0000 mg | ORAL_TABLET | Freq: Every day | ORAL | Status: DC
  Administered 2014-05-18 – 2014-05-19 (×2): 100 mg via ORAL
  Filled 2014-05-18 (×2): qty 1

## 2014-05-18 MED ORDER — ONDANSETRON HCL 4 MG PO TABS: 4.0000 mg | ORAL_TABLET | Freq: Three times a day (TID) | ORAL | Status: DC | PRN

## 2014-05-18 MED ORDER — ASPIRIN EC 81 MG PO TBEC
81.0000 mg | DELAYED_RELEASE_TABLET | Freq: Every day | ORAL | Status: DC
  Administered 2014-05-19 – 2014-05-20 (×2): 81 mg via ORAL
  Filled 2014-05-18 (×3): qty 1

## 2014-05-18 MED ORDER — BENZTROPINE MESYLATE 1 MG PO TABS
1.0000 mg | ORAL_TABLET | Freq: Two times a day (BID) | ORAL | Status: DC
  Administered 2014-05-18 – 2014-05-20 (×4): 1 mg via ORAL
  Filled 2014-05-18 (×4): qty 1

## 2014-05-18 MED ORDER — LEVOTHYROXINE SODIUM 75 MCG PO TABS
75.0000 ug | ORAL_TABLET | Freq: Every day | ORAL | Status: DC
  Administered 2014-05-19 – 2014-05-20 (×2): 75 ug via ORAL
  Filled 2014-05-18 (×3): qty 1

## 2014-05-18 MED ORDER — HALOPERIDOL 5 MG PO TABS
5.0000 mg | ORAL_TABLET | Freq: Three times a day (TID) | ORAL | Status: DC
  Administered 2014-05-18 – 2014-05-19 (×2): 5 mg via ORAL
  Filled 2014-05-18 (×2): qty 1

## 2014-05-18 MED ORDER — HYDROXYZINE HCL 25 MG PO TABS
50.0000 mg | ORAL_TABLET | Freq: Every day | ORAL | Status: DC
  Administered 2014-05-18 – 2014-05-19 (×2): 50 mg via ORAL
  Filled 2014-05-18 (×2): qty 2

## 2014-05-18 MED ORDER — NICOTINE 21 MG/24HR TD PT24: 21.0000 mg | MEDICATED_PATCH | Freq: Every day | TRANSDERMAL | Status: DC

## 2014-05-18 MED ORDER — LAMOTRIGINE 200 MG PO TABS
200.0000 mg | ORAL_TABLET | Freq: Every day | ORAL | Status: DC
  Administered 2014-05-18 – 2014-05-20 (×3): 200 mg via ORAL
  Filled 2014-05-18 (×3): qty 1

## 2014-05-18 MED ORDER — ACETAMINOPHEN 325 MG PO TABS
650.0000 mg | ORAL_TABLET | ORAL | Status: DC | PRN
Start: 2014-05-18 — End: 2014-05-20
  Administered 2014-05-18: 650 mg via ORAL
  Filled 2014-05-18: qty 2

## 2014-05-18 MED ORDER — LORAZEPAM 1 MG PO TABS
1.0000 mg | ORAL_TABLET | Freq: Three times a day (TID) | ORAL | Status: DC | PRN
  Administered 2014-05-18: 1 mg via ORAL
  Filled 2014-05-18: qty 1

## 2014-05-18 MED ORDER — DIVALPROEX SODIUM ER 500 MG PO TB24
500.0000 mg | ORAL_TABLET | Freq: Every day | ORAL | Status: DC
  Administered 2014-05-19: 500 mg via ORAL
  Filled 2014-05-18: qty 1

## 2014-05-18 MED ORDER — ALUM & MAG HYDROXIDE-SIMETH 200-200-20 MG/5ML PO SUSP: 30.0000 mL | ORAL | Status: DC | PRN

## 2014-05-18 MED ORDER — IBUPROFEN 200 MG PO TABS: 600.0000 mg | ORAL_TABLET | Freq: Three times a day (TID) | ORAL | Status: DC | PRN

## 2014-05-18 MED ORDER — LEVOTHYROXINE SODIUM 100 MCG PO TABS
100.0000 ug | ORAL_TABLET | Freq: Every day | ORAL | Status: DC
Start: 2014-05-19 — End: 2014-05-18

## 2014-05-18 MED ORDER — ZOLPIDEM TARTRATE 5 MG PO TABS: 5.0000 mg | ORAL_TABLET | Freq: Every evening | ORAL | Status: DC | PRN

## 2014-05-18 MED ORDER — TOPIRAMATE 25 MG PO TABS
25.0000 mg | ORAL_TABLET | Freq: Two times a day (BID) | ORAL | Status: DC
  Administered 2014-05-18 – 2014-05-20 (×4): 25 mg via ORAL
  Filled 2014-05-18 (×4): qty 1

## 2014-05-18 NOTE — ED Notes (Signed)
Called [facility] to give report asked to call back in 30 minutes after they finished discharging multiple patients.

## 2014-05-18 NOTE — BH Assessment (Signed)
Per Morrie SheldonAshley, patient accepted to [facility] by Dr. Frederik PearUma Thoratakora after 7am 05/19/2013. The nursing report # is [id number]. [facility] to transport patient to the facility.

## 2014-05-18 NOTE — ED Notes (Addendum)
Pt was with psychiatrist and was told to come here due to SI and depression [facility]. Pt does not have a plan. Denies HI.

## 2014-05-18 NOTE — ED Notes (Signed)
Pt is aware of behavioral health protocol, husband stated that he will take belongings with him. No belongings will be locked up. Pt has been wanded.
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
## 2014-05-18 NOTE — Progress Notes (Signed)
Error in Documentation---Wrong EMTALA Note on Wrong pt.
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
## 2014-05-18 NOTE — Progress Notes (Signed)
   THERAPIST PROGRESS NOTE  Session Time: 10:00 -10:17  Participation Level: Minimal  Behavioral Response: DisheveledConfused and LethargicDepressed and Worthless  Type of Therapy: Individual Therapy  Treatment Goals addressed: Diagnosis: Schizophrenia and suicidal ideation  Interventions: Motivational Interviewing  Summary: ZAKYRIA METZINGER is a 44 y.o. female who presents with Schizophrenia and PTSD.   Suicidal/Homicidal: Yes - asking permission to go to heaven from her loved ones. She is also experiencing heavy hallucinations with command voices that tell her she is bad and to hurt herself  Therapist Response: Margarie and her husband met with clinician at clients regularly scheduled appointment. Lakoda was not able to talk much due to her symptoms so her husband husband shared that [place] had a very difficult week. She had been hallucinating heavily. Her medication was adjusted Friday but the hallucinations have continued and have gotten worse. Lemon has been asking loved ones if it is okay for her to go to heaven. Her hallucinations include command voices and shadow people. She is both scared and unable to engage in her usual manner.   Clinician walked client and husband to the inpatient unit.  Clinician worked with Ava in inpatient to get her assessed and accepted. Ava agreed that she meet criteria. Clinician left Darrion and her husband with Ava for her assessment and processing.    Plan:Inpatient care  Diagnosis: Axis I: Schizophrenia and PTSD      Sydelle Sherfield A, LCSW 05/18/2014

## 2014-05-18 NOTE — BH Assessment (Signed)
Writer informed the Press photographerCharge Nurse (Tim) [facility]  Patient reports that the, "shadow people have been telling her to kill herself.

## 2014-05-18 NOTE — [facility] Counselor (Addendum)
[facility] [place]  Danielle PhoenixBrandi Afiya [name], [facility] Triage Specialist

## 2014-05-18 NOTE — ED Notes (Signed)
[facility]

## 2014-05-19 DIAGNOSIS — F200 Paranoid schizophrenia: Secondary | ICD-10-CM | POA: Insufficient documentation

## 2014-05-19 DIAGNOSIS — R45851 Suicidal ideations: Secondary | ICD-10-CM

## 2014-05-19 DIAGNOSIS — F319 Bipolar disorder, unspecified: Secondary | ICD-10-CM

## 2014-05-19 DIAGNOSIS — F4310 Post-traumatic stress disorder, unspecified: Secondary | ICD-10-CM

## 2014-05-19 DIAGNOSIS — F39 Unspecified mood [affective] disorder: Secondary | ICD-10-CM

## 2014-05-19 LAB — VALPROIC ACID LEVEL: Valproic Acid Lvl: 28.3 ug/mL — ABNORMAL LOW (ref 50.0–100.0)

## 2014-05-19 MED ORDER — HALOPERIDOL 5 MG PO TABS
10.0000 mg | ORAL_TABLET | Freq: Every day | ORAL | Status: DC
  Administered 2014-05-19: 10 mg via ORAL
  Filled 2014-05-19: qty 2

## 2014-05-19 MED ORDER — HALOPERIDOL 5 MG PO TABS
5.0000 mg | ORAL_TABLET | Freq: Two times a day (BID) | ORAL | Status: DC
  Administered 2014-05-19 – 2014-05-20 (×2): 5 mg via ORAL
  Filled 2014-05-19 (×2): qty 1

## 2014-05-19 MED ORDER — DIVALPROEX SODIUM ER 500 MG PO TB24
500.0000 mg | ORAL_TABLET | Freq: Two times a day (BID) | ORAL | Status: DC
  Administered 2014-05-19 – 2014-05-20 (×2): 500 mg via ORAL
  Filled 2014-05-19 (×4): qty 1

## 2014-05-19 MED ORDER — CLOPIDOGREL BISULFATE 75 MG PO TABS
75.0000 mg | ORAL_TABLET | Freq: Every day | ORAL | Status: DC
  Administered 2014-05-19 – 2014-05-20 (×2): 75 mg via ORAL
  Filled 2014-05-19 (×2): qty 1

## 2014-05-19 MED ORDER — CIPROFLOXACIN HCL 500 MG PO TABS
500.0000 mg | ORAL_TABLET | Freq: Two times a day (BID) | ORAL | Status: DC
  Administered 2014-05-19 – 2014-05-20 (×3): 500 mg via ORAL
  Filled 2014-05-19 (×3): qty 1

## 2014-05-19 MED ORDER — DIVALPROEX SODIUM ER 500 MG PO TB24
1000.0000 mg | ORAL_TABLET | Freq: Once | ORAL | Status: AC
  Administered 2014-05-19: 1000 mg via ORAL
  Filled 2014-05-19: qty 2

## 2014-05-19 NOTE — ED Notes (Signed)
Dr Silverio LayYao into see

## 2014-05-19 NOTE — ED Notes (Signed)
Pt is being IVC'd

## 2014-05-19 NOTE — ED Notes (Signed)
Pt is aware that she has been accepted to [name] but is refusing to go. Will notify TTS.

## 2014-05-19 NOTE — ED Notes (Signed)
Up on the phone 

## 2014-05-19 NOTE — ED Notes (Signed)
Josephine NP into see.  Pt and NP aware of new orders.  Pt to have repeat depekote level in AM, and transfer to [facility] suspended until pt has theraputic level.  Level to be checked daily in AM

## 2014-05-19 NOTE — ED Notes (Signed)
Dr Silverio LayYao updated and will eval

## 2014-05-19 NOTE — ED Notes (Signed)
NAD, resting quietly, is eating lunch

## 2014-05-19 NOTE — ED Notes (Signed)
NAd, alert, eating lunch.

## 2014-05-19 NOTE — Consult Note (Signed)
[facility] Face-to-Face Psychiatry Consult   Reason for Consult: Suicide ideation Referring Physician:  EDP Danielle [name] is an 44 y.o. female. Total Time spent with patient: 1 hour  Assessment: DSM5  Mood Disorder NOS, Bipolar Disorder and PTSD   Past Medical History  Diagnosis Date  . Depressed   . Seizures   . Heart murmur   . Hypothyroidism   . Anemia   . Headache(784.0)   . Anxiety   . PTSD (post-traumatic stress disorder)   . Shortness of breath   . Schizophrenia   . Bipolar 1 disorder      Plan:  Recommend psychiatric Inpatient admission when medically cleared. Was [facility] admission is deferred now because had a neurological episode where she lost conciousness  Subjective:   Danielle [name] is a 44 y.o. female patient admitted with .  HPI: Caucasian female was seen this am who came for feeling suicidal.  Patient has a hx of Bipolar disorder and stated that she is compliant with her medications.  Patient sees Dr Adele Schilder for her [place] was seen seen last month.  Patient also has a hx of seizure disorder and occasionally goes into mild seizures where she loses consciousness.  Patient was accepted this morning to go to [facility] but this plan is no long holding because patient had an episode of mini seizure.  Patient's Depakote level came back subtherapeutic.  EDP Darl Householder spoke with Patient's Neurologist and have made changes to her Depakote dosage.  Patient will be kept in the ER overnight for reevaluation and repeat Depakote level in am denies SI/HI/AVH at this time.  HPI Elements:   Location:  Suicide ideation, Bipolar disorder, PTSD. Quality:  Suicide ideation, feelings of hopelessness and helplessness.. Severity:  severe. Timing:  Acute. Duration:  Chronic mental illness. Context:  Seeking treatment for depression and Bi[polar disorder..  Past Psychiatric History: Past Medical History  Diagnosis Date  . Depressed   . Seizures   . Heart murmur   . Hypothyroidism   .  Anemia   . Headache(784.0)   . Anxiety   . PTSD (post-traumatic stress disorder)   . Shortness of breath   . Schizophrenia   . Bipolar 1 disorder     reports that she quit smoking about 2 years ago. Her smoking use included Cigarettes. She has a 45 pack-year smoking history. She has never used smokeless tobacco. She reports that she does not drink alcohol or use illicit drugs. Family History  Problem Relation Age of Onset  . Adopted: Yes           Allergies:   Allergies  Allergen Reactions  . Tomato Rash    Pt reports that she breaks out in a rash if she eats tomato based foods but can eat a whole tomato  . Imitrex [Sumatriptan Base] Hives  . Mustard Seed Rash    Also allergic to [facility]    ACT Assessment Complete:  Yes:    Educational Status    Risk to Self: Risk to self with the past 6 months Is patient at risk for suicide?: Yes Substance abuse history and/or treatment for substance abuse?: No  Risk to Others:    Abuse:    Prior Inpatient Therapy:    Prior Outpatient Therapy:    Additional Information:      Objective: Blood pressure 101/73, pulse 75, temperature 98.1 F (36.7 C), temperature source Oral, resp. rate 16, SpO2 96 %.There is no weight on file to calculate  BMI. Results for orders placed or performed during the hospital encounter of 05/18/14 (from the past 72 hour(s))  Acetaminophen level     Status: Abnormal   Collection Time: 05/18/14 11:40 AM  Result Value Ref Range   Acetaminophen (Tylenol), Serum <10.0 (L) 10 - 30 ug/mL    Comment:        THERAPEUTIC CONCENTRATIONS VARY SIGNIFICANTLY. A RANGE OF 10-30 ug/mL MAY BE AN EFFECTIVE CONCENTRATION FOR MANY PATIENTS. HOWEVER, SOME ARE BEST TREATED AT CONCENTRATIONS OUTSIDE THIS RANGE. ACETAMINOPHEN CONCENTRATIONS >150 ug/mL AT 4 HOURS AFTER INGESTION AND >50 ug/mL AT 12 HOURS AFTER INGESTION ARE OFTEN ASSOCIATED WITH TOXIC REACTIONS.   CBC     Status: None   Collection Time: 05/18/14 11:40 AM   Result Value Ref Range   WBC 5.9 4.0 - 10.5 K/uL   RBC 3.94 3.87 - 5.11 MIL/uL   Hemoglobin 12.2 12.0 - 15.0 g/dL   HCT 36.3 36.0 - 46.0 %   MCV 92.1 78.0 - 100.0 fL   MCH 31.0 26.0 - 34.0 pg   MCHC 33.6 30.0 - 36.0 g/dL   RDW 13.0 11.5 - 15.5 %   Platelets 237 150 - 400 K/uL  Comprehensive metabolic panel     Status: Abnormal   Collection Time: 05/18/14 11:40 AM  Result Value Ref Range   Sodium 136 135 - 145 mmol/L    Comment: Please note change in reference range.   Potassium 4.2 3.5 - 5.1 mmol/L    Comment: Please note change in reference range.   Chloride 108 96 - 112 mEq/L   CO2 20 19 - 32 mmol/L   Glucose, Bld 103 (H) 70 - 99 mg/dL   BUN 21 6 - 23 mg/dL   Creatinine, Ser 0.87 0.50 - 1.10 mg/dL   Calcium 9.1 8.4 - 10.5 mg/dL   Total Protein 7.1 6.0 - 8.3 g/dL   Albumin 4.1 3.5 - 5.2 g/dL   AST 15 0 - 37 U/L   ALT 11 0 - 35 U/L   Alkaline Phosphatase 63 39 - 117 U/L   Total Bilirubin 0.3 0.3 - 1.2 mg/dL   GFR calc non Af Amer 79 (L) >90 mL/min   GFR calc Af Amer >90 >90 mL/min    Comment: (NOTE) The eGFR has been calculated using the CKD EPI equation. This calculation has not been validated in all clinical situations. eGFR's persistently <90 mL/min signify possible Chronic Kidney Disease.    Anion gap 8 5 - 15  Ethanol (ETOH)     Status: None   Collection Time: 05/18/14 11:40 AM  Result Value Ref Range   Alcohol, Ethyl (B) <5 0 - 9 mg/dL    Comment:        LOWEST DETECTABLE LIMIT FOR SERUM ALCOHOL IS 11 mg/dL FOR MEDICAL PURPOSES ONLY   Salicylate level     Status: None   Collection Time: 05/18/14 11:40 AM  Result Value Ref Range   Salicylate Lvl <7.6 2.8 - 20.0 mg/dL  Urine Drug Screen     Status: None   Collection Time: 05/18/14 11:48 AM  Result Value Ref Range   Opiates NONE DETECTED NONE DETECTED   Cocaine NONE DETECTED NONE DETECTED   Benzodiazepines NONE DETECTED NONE DETECTED   Amphetamines NONE DETECTED NONE DETECTED   Tetrahydrocannabinol NONE  DETECTED NONE DETECTED   Barbiturates NONE DETECTED NONE DETECTED    Comment:        DRUG SCREEN FOR MEDICAL PURPOSES ONLY.  IF CONFIRMATION IS  NEEDED FOR ANY PURPOSE, NOTIFY LAB WITHIN 5 DAYS.        LOWEST DETECTABLE LIMITS FOR URINE DRUG SCREEN Drug Class       Cutoff (ng/mL) Amphetamine      1000 Barbiturate      200 Benzodiazepine   782 Tricyclics       423 Opiates          300 Cocaine          300 THC              50   Urinalysis, Routine w reflex microscopic     Status: Abnormal   Collection Time: 05/18/14  7:44 PM  Result Value Ref Range   Color, Urine YELLOW YELLOW   APPearance CLEAR CLEAR   Specific Gravity, Urine 1.016 1.005 - 1.030   pH 7.0 5.0 - 8.0   Glucose, UA NEGATIVE NEGATIVE mg/dL   Hgb urine dipstick TRACE (A) NEGATIVE   Bilirubin Urine NEGATIVE NEGATIVE   Ketones, ur NEGATIVE NEGATIVE mg/dL   Protein, ur NEGATIVE NEGATIVE mg/dL   Urobilinogen, UA 0.2 0.0 - 1.0 mg/dL   Nitrite NEGATIVE NEGATIVE   Leukocytes, UA MODERATE (A) NEGATIVE  Urine microscopic-add on     Status: Abnormal   Collection Time: 05/18/14  7:44 PM  Result Value Ref Range   Squamous Epithelial / LPF RARE RARE   WBC, UA 7-10 <3 WBC/hpf   RBC / HPF 3-6 <3 RBC/hpf   Bacteria, UA MANY (A) RARE  Valproic acid level     Status: Abnormal   Collection Time: 05/19/14  8:45 AM  Result Value Ref Range   Valproic Acid Lvl 28.3 (L) 50.0 - 100.0 ug/mL    Comment: Performed at [facility] are reviewed and are pertinent for Sub herapeutic Depakote level  Current Facility-Administered Medications  Medication Dose Route Frequency Provider Last Rate Last Dose  . acetaminophen (TYLENOL) tablet 650 mg  650 mg Oral Q4H PRN Carrie Mew, PA-C   650 mg at 05/18/14 1647  . alum & mag hydroxide-simeth (MAALOX/MYLANTA) 200-200-20 MG/5ML suspension 30 mL  30 mL Oral PRN Carrie Mew, PA-C      . aspirin EC tablet 81 mg  81 mg Oral Q breakfast Waylan Boga, NP   81 mg at 05/19/14 0851   . benztropine (COGENTIN) tablet 1 mg  1 mg Oral BID Waylan Boga, NP   1 mg at 05/19/14 0951  . ciprofloxacin (CIPRO) tablet 500 mg  500 mg Oral BID Wandra Arthurs, MD   500 mg at 05/19/14 0851  . clopidogrel (PLAVIX) tablet 75 mg  75 mg Oral Daily Wandra Arthurs, MD      . divalproex (DEPAKOTE ER) 24 hr tablet 1,000 mg  1,000 mg Oral Once Wandra Arthurs, MD      . divalproex (DEPAKOTE ER) 24 hr tablet 500 mg  500 mg Oral BID Wandra Arthurs, MD      . haloperidol (HALDOL) tablet 10 mg  10 mg Oral QHS Delfin Gant, NP      . haloperidol (HALDOL) tablet 5 mg  5 mg Oral BID Delfin Gant, NP      . hydrOXYzine (ATARAX/VISTARIL) tablet 50 mg  50 mg Oral QHS Waylan Boga, NP   50 mg at 05/18/14 2121  . ibuprofen (ADVIL,MOTRIN) tablet 600 mg  600 mg Oral Q8H PRN Carrie Mew, PA-C      . lamoTRIgine (LAMICTAL) tablet  200 mg  200 mg Oral Daily Waylan Boga, NP   200 mg at 05/19/14 0951  . levothyroxine (SYNTHROID, LEVOTHROID) tablet 75 mcg  75 mcg Oral QAC breakfast Waylan Boga, NP   75 mcg at 05/19/14 0803  . LORazepam (ATIVAN) tablet 1 mg  1 mg Oral Q8H PRN Carrie Mew, PA-C   1 mg at 05/18/14 1647  . nicotine (NICODERM CQ - dosed in mg/24 hours) patch 21 mg  21 mg Transdermal Daily Carrie Mew, PA-C   21 mg at 05/18/14 1400  . ondansetron (ZOFRAN) tablet 4 mg  4 mg Oral Q8H PRN Carrie Mew, PA-C      . topiramate (TOPAMAX) tablet 25 mg  25 mg Oral BID Waylan Boga, NP   25 mg at 05/19/14 0951  . traZODone (DESYREL) tablet 100 mg  100 mg Oral QHS Waylan Boga, NP   100 mg at 05/18/14 2121   Current Outpatient Prescriptions  Medication Sig Dispense Refill  . aspirin EC 81 MG tablet Take 81 mg by mouth daily with breakfast.    . clopidogrel (PLAVIX) 75 MG tablet Take 75 mg by mouth daily.    . divalproex (DEPAKOTE ER) 500 MG 24 hr tablet TAKE 1 TABLET (500 MG TOTAL) BY MOUTH 2 (TWO) TIMES DAILY. 60 tablet 1  . haloperidol (HALDOL) 5 MG tablet TAKE 1 TAB TWICE A DAY AND 2 AT BED TIME.  (Patient taking differently: Take 5-10 mg by mouth 3 (three) times daily. Takes 51m in the morning and evening, then 17mat bedtime) 120 tablet 1  . hydrOXYzine (ATARAX/VISTARIL) 50 MG tablet Take 1 tablet (50 mg total) by mouth at bedtime. 30 tablet 1  . lamoTRIgine (LAMICTAL) 200 MG tablet Take 1 tablet (200 mg total) by mouth daily. TAKE 1 TABLET (150 MG TOTAL) BY MOUTH AT BEDTIME. (Patient taking differently: Take 200 mg by mouth daily. ) 30 tablet 1  . topiramate (TOPAMAX) 25 MG tablet Take 25 mg by mouth 2 (two) times daily.     . traZODone (DESYREL) 100 MG tablet Take 1 tablet (100 mg total) by mouth at bedtime. 30 tablet 1  . levothyroxine (SYNTHROID, LEVOTHROID) 100 MCG tablet Take 1 tablet (100 mcg total) by mouth daily before breakfast. (Patient not taking: Reported on 05/18/2014)    . [DISCONTINUED] zolpidem (AMBIEN) 5 MG tablet Take 1 tablet (5 mg total) by mouth at bedtime as needed for sleep. 10 tablet 0    Psychiatric Specialty Exam:     Blood pressure 101/73, pulse 75, temperature 98.1 F (36.7 C), temperature source Oral, resp. rate 16, SpO2 96 %.There is no weight on file to calculate BMI.  General Appearance: Casual  Eye Contact::  Good  Speech:  Clear and Coherent and Normal Rate  Volume:  Normal  Mood:  Angry, Anxious, Depressed and Hopeless  Affect:  Congruent, Depressed, Flat and Tearful  Thought Process:  Coherent, Goal Directed and Intact  Orientation:  Full (Time, Place, and Person)  Thought Content:  suicidal, no plan  Suicidal Thoughts:  Yes.  without intent/plan  Homicidal Thoughts:  No  Memory:  Immediate;   Good  Judgement:  Fair  Insight:  Good  Psychomotor Activity:  Normal  Concentration:  Good  Recall:  NA  Fund of Knowledge:Good  Language: Good  Akathisia:  NA  Handed:  Right  AIMS (if indicated):     Assets:  Desire for Improvement  Sleep:      Musculoskeletal: Strength & Muscle  Tone: within normal limits Gait & Station: normal Patient  leans: N/A  Treatment Plan Summary: Daily contact with patient to assess and evaluate symptoms and progress in treatment Medication management  Delfin Gant   PMHNP-BC 05/19/2014 3:10 PM  Patient seen, evaluated and I agree with notes by Nurse Practitioner. Corena Pilgrim, MD

## 2014-05-19 NOTE — ED Notes (Signed)
Up to the bathroom and to the phone

## 2014-05-19 NOTE — ED Notes (Signed)
Danielle [name] notified that pt will not transport today.  Pt will have to be re-processed  For admission

## 2014-05-19 NOTE — ED Notes (Addendum)
Pt sitting on the side of the bed eating, talking to her husband.  Pt the layed down on her lt side and became unresponsive, no tonic/colonic activity noted.  Episode last approx 30 sec.  Pt became responsive, sat up but was not able to talk. Pt then again layed onto her lt side and was unresponsive for approx 30 sec.  Pt then sat up, was able to talk.   Husband reports that she has been having episodes like this since they have been together ([id number]) and she has been evaluated multiple times and that this is typical of her episodes. He reports that they occur sporatically.  He reports that they think it is some type of seizure activity.

## 2014-05-19 NOTE — ED Notes (Signed)
Pt reports that stress and anxiety make the episodes worse and more frequent.

## 2014-05-19 NOTE — ED Notes (Signed)
Pt c/o of dizzyness short while ago.  Pt eating snack, reports dizzyness has resolved.

## 2014-05-19 NOTE — ED Notes (Signed)
[facility] contacted-transport cancelled

## 2014-05-19 NOTE — ED Provider Notes (Addendum)
  Physical Exam  BP 101/73 mmHg  Pulse 75  Temp(Src) 98.1 F (36.7 C) (Oral)  Resp 16  SpO2 96%  Physical Exam  ED Course  Procedures  MDM Patient was accepted at [facility]. However, had an episode when she felt light headed and became unresponsive for about 30 seconds. When I evaluated her, she is back to baseline. She states that she gets this with her previous seizure. No chest pain prior to episode. I think likely seizure activity. Depakote this AM is 28, but she wasn't given night time dose last night. BP was low initially but now is 101/70 with no intervention. I called Dr. Cyril Mourningamillo, who recommend giving depakote 1000 mg now and continue current dose. She will need depakote level qAM until therapeutic before she can be placed. Of note, she may have UTI on UA, will add cipro for 7 days. Will monitor closely.      Richardean Canalavid H Aristotelis Vilardi, MD 05/19/14 1501  Richardean Canalavid H Caelin Rayl, MD 05/19/14 [phone]

## 2014-05-19 NOTE — ED Notes (Signed)
Up on the phone, pt is aware that she is going to be transferred to [facility] today.  Husband is going to bring her clothes to take w/ her.

## 2014-05-19 NOTE — ED Notes (Signed)
Up to the bathroom 

## 2014-05-19 NOTE — ED Notes (Signed)
Dr Mervyn SkeetersA, and Josephone NP in to see

## 2014-05-19 NOTE — ED Notes (Signed)
[facility] notified of transport 

## 2014-05-20 LAB — URINE CULTURE

## 2014-05-20 LAB — VALPROIC ACID LEVEL: Valproic Acid Lvl: 99 ug/mL (ref 50.0–100.0)

## 2014-05-20 MED ORDER — WHITE PETROLATUM GEL
Status: DC | PRN
  Filled 2014-05-20: qty 28.35
  Filled 2014-05-20 (×3): qty 5

## 2014-05-20 NOTE — ED Notes (Signed)
Patient pleasant on approach. Patient alert and oriented. Depakote level in normal range. Patient expressing that she doesn't want to go to [facility] but didn't give a reason why. Patient answering most questions quickly if its about other things besides mood and SI but has some thought blocking when trying to ask about SI. Still has some passive SI. Will continue to monitor.

## 2014-05-20 NOTE — Consult Note (Signed)
[facility] Face-to-Face Psychiatry Consult   Reason for Consult: Suicide ideation Referring Physician:  EDP Danielle [name] is an 44 y.o. female. Total Time spent with patient: 1 hour  Assessment: DSM5  Mood Disorder NOS, Bipolar Disorder and PTSD   Past Medical History  Diagnosis Date  . Depressed   . Seizures   . Heart murmur   . Hypothyroidism   . Anemia   . Headache(784.0)   . Anxiety   . PTSD (post-traumatic stress disorder)   . Shortness of breath   . Schizophrenia   . Bipolar 1 disorder      Plan:  Recommend psychiatric Inpatient admission when medically cleared. Was accepted at [facility] admission is deferred now because had a neurological episode where she lost conciousness  Subjective:   Danielle [name] is a 44 y.o. female patient admitted with .  HPI: Caucasian female was seen this am who came for feeling suicidal.  Patient has a hx of Bipolar disorder and stated that she is compliant with her medications.  Patient sees Dr Adele Schilder for her [place] was seen seen last month.  Patient also has a hx of seizure disorder and occasionally goes into mild seizures where she loses consciousness.  Patient was accepted this [facility] to [facility] to [facility] but this plan is no long holding because patient had an episode of mini seizure.  Patient's Depakote level came back subtherapeutic.  EDP Darl Householder spoke with Patient's Neurologist and have made changes to her Depakote dosage.  Patient will be kept in the ER overnight for reevaluation and repeat Depakote level in am denies SI/HI/AVH at this time.  Today patient's Depakote level came up to 99.  Patient is still feeling suicidal.  She has been accepted at [facility] and will be transferred as soon as transportation is here.   HPI Elements:   Location:  Suicide ideation, Bipolar disorder, PTSD. Quality:  Suicide ideation, feelings of hopelessness and helplessness.. Severity:  severe. Timing:  Acute. Duration:  Chronic mental illness. Context:  Seeking treatment for  depression and Bi[polar disorder..  Past Psychiatric History: Past Medical History  Diagnosis Date  . Depressed   . Seizures   . Heart murmur   . Hypothyroidism   . Anemia   . Headache(784.0)   . Anxiety   . PTSD (post-traumatic stress disorder)   . Shortness of breath   . Schizophrenia   . Bipolar 1 disorder     reports that she quit smoking about 2 years ago. Her smoking use included Cigarettes. She has a 45 pack-year smoking history. She has never used smokeless tobacco. She reports that she does not drink alcohol or use illicit drugs. Family History  Problem Relation Age of Onset  . Adopted: Yes           Allergies:   Allergies  Allergen Reactions  . Tomato Rash    Pt reports that she breaks out in a rash if she eats tomato based foods but can eat a whole tomato  . Imitrex [Sumatriptan Base] Hives  . Mustard Seed Rash    Also allergic to [facility]    ACT Assessment Complete:  Yes:    Educational Status    Risk to Self: Risk to self with the past 6 months Is patient at risk for suicide?: Yes Substance abuse history and/or treatment for substance abuse?: No  Risk to Others:    Abuse:    Prior Inpatient Therapy:    Prior Outpatient Therapy:    Additional  Information:      Objective: Blood pressure 101/63, pulse 67, temperature 98.2 F (36.8 C), temperature source Oral, resp. rate 18, SpO2 98 %.There is no weight on file to calculate BMI. Results for orders placed or performed during the hospital encounter of 05/18/14 (from the past 72 hour(s))  Acetaminophen level     Status: Abnormal   Collection Time: 05/18/14 11:40 AM  Result Value Ref Range   Acetaminophen (Tylenol), Serum <10.0 (L) 10 - 30 ug/mL    Comment:        THERAPEUTIC CONCENTRATIONS VARY SIGNIFICANTLY. A RANGE OF 10-30 ug/mL MAY BE AN EFFECTIVE CONCENTRATION FOR MANY PATIENTS. HOWEVER, SOME ARE BEST TREATED AT CONCENTRATIONS OUTSIDE THIS RANGE. ACETAMINOPHEN CONCENTRATIONS >150 ug/mL AT 4  HOURS AFTER INGESTION AND >50 ug/mL AT 12 HOURS AFTER INGESTION ARE OFTEN ASSOCIATED WITH TOXIC REACTIONS.   CBC     Status: None   Collection Time: 05/18/14 11:40 AM  Result Value Ref Range   WBC 5.9 4.0 - 10.5 K/uL   RBC 3.94 3.87 - 5.11 MIL/uL   Hemoglobin 12.2 12.0 - 15.0 g/dL   HCT 36.3 36.0 - 46.0 %   MCV 92.1 78.0 - 100.0 fL   MCH 31.0 26.0 - 34.0 pg   MCHC 33.6 30.0 - 36.0 g/dL   RDW 13.0 11.5 - 15.5 %   Platelets 237 150 - 400 K/uL  Comprehensive metabolic panel     Status: Abnormal   Collection Time: 05/18/14 11:40 AM  Result Value Ref Range   Sodium 136 135 - 145 mmol/L    Comment: Please note change in reference range.   Potassium 4.2 3.5 - 5.1 mmol/L    Comment: Please note change in reference range.   Chloride 108 96 - 112 mEq/L   CO2 20 19 - 32 mmol/L   Glucose, Bld 103 (H) 70 - 99 mg/dL   BUN 21 6 - 23 mg/dL   Creatinine, Ser 0.87 0.50 - 1.10 mg/dL   Calcium 9.1 8.4 - 10.5 mg/dL   Total Protein 7.1 6.0 - 8.3 g/dL   Albumin 4.1 3.5 - 5.2 g/dL   AST 15 0 - 37 U/L   ALT 11 0 - 35 U/L   Alkaline Phosphatase 63 39 - 117 U/L   Total Bilirubin 0.3 0.3 - 1.2 mg/dL   GFR calc non Af Amer 79 (L) >90 mL/min   GFR calc Af Amer >90 >90 mL/min    Comment: (NOTE) The eGFR has been calculated using the CKD EPI equation. This calculation has not been validated in all clinical situations. eGFR's persistently <90 mL/min signify possible Chronic Kidney Disease.    Anion gap 8 5 - 15  Ethanol (ETOH)     Status: None   Collection Time: 05/18/14 11:40 AM  Result Value Ref Range   Alcohol, Ethyl (B) <5 0 - 9 mg/dL    Comment:        LOWEST DETECTABLE LIMIT FOR SERUM ALCOHOL IS 11 mg/dL FOR MEDICAL PURPOSES ONLY   Salicylate level     Status: None   Collection Time: 05/18/14 11:40 AM  Result Value Ref Range   Salicylate Lvl <1.6 2.8 - 20.0 mg/dL  Urine Drug Screen     Status: None   Collection Time: 05/18/14 11:48 AM  Result Value Ref Range   Opiates NONE DETECTED  NONE DETECTED   Cocaine NONE DETECTED NONE DETECTED   Benzodiazepines NONE DETECTED NONE DETECTED   Amphetamines NONE DETECTED NONE DETECTED  Tetrahydrocannabinol NONE DETECTED NONE DETECTED   Barbiturates NONE DETECTED NONE DETECTED    Comment:        DRUG SCREEN FOR MEDICAL PURPOSES ONLY.  IF CONFIRMATION IS NEEDED FOR ANY PURPOSE, NOTIFY LAB WITHIN 5 DAYS.        LOWEST DETECTABLE LIMITS FOR URINE DRUG SCREEN Drug Class       Cutoff (ng/mL) Amphetamine      1000 Barbiturate      200 Benzodiazepine   009 Tricyclics       233 Opiates          300 Cocaine          300 THC              50   Urine culture     Status: None   Collection Time: 05/18/14 11:48 AM  Result Value Ref Range   Specimen Description URINE, CLEAN CATCH    Special Requests NONE    Colony Count      70,000 COLONIES/ML Performed at [facility]    Culture      Multiple bacterial morphotypes present, none predominant. Suggest appropriate recollection if clinically indicated. Performed at [facility]    Report Status 05/20/2014 FINAL   Urinalysis, Routine w reflex microscopic     Status: Abnormal   Collection Time: 05/18/14  7:44 PM  Result Value Ref Range   Color, Urine YELLOW YELLOW   APPearance CLEAR CLEAR   Specific Gravity, Urine 1.016 1.005 - 1.030   pH 7.0 5.0 - 8.0   Glucose, UA NEGATIVE NEGATIVE mg/dL   Hgb urine dipstick TRACE (A) NEGATIVE   Bilirubin Urine NEGATIVE NEGATIVE   Ketones, ur NEGATIVE NEGATIVE mg/dL   Protein, ur NEGATIVE NEGATIVE mg/dL   Urobilinogen, UA 0.2 0.0 - 1.0 mg/dL   Nitrite NEGATIVE NEGATIVE   Leukocytes, UA MODERATE (A) NEGATIVE  Urine microscopic-add on     Status: Abnormal   Collection Time: 05/18/14  7:44 PM  Result Value Ref Range   Squamous Epithelial / LPF RARE RARE   WBC, UA 7-10 <3 WBC/hpf   RBC / HPF 3-6 <3 RBC/hpf   Bacteria, UA MANY (A) RARE  Valproic acid level     Status: Abnormal   Collection Time: 05/19/14  8:45 AM  Result  Value Ref Range   Valproic Acid Lvl 28.3 (L) 50.0 - 100.0 ug/mL    Comment: Performed at [facility]  Valproic acid level     Status: None   Collection Time: 05/20/14  5:30 AM  Result Value Ref Range   Valproic Acid Lvl 99.0 50.0 - 100.0 ug/mL    Comment: Performed at [facility] are reviewed and are pertinent for Sub herapeutic Depakote level  Current Facility-Administered Medications  Medication Dose Route Frequency Provider Last Rate Last Dose  . acetaminophen (TYLENOL) tablet 650 mg  650 mg Oral Q4H PRN Carrie Mew, PA-C   650 mg at 05/18/14 1647  . alum & mag hydroxide-simeth (MAALOX/MYLANTA) 200-200-20 MG/5ML suspension 30 mL  30 mL Oral PRN Carrie Mew, PA-C      . aspirin EC tablet 81 mg  81 mg Oral Q breakfast Waylan Boga, NP   81 mg at 05/20/14 0824  . benztropine (COGENTIN) tablet 1 mg  1 mg Oral BID Waylan Boga, NP   1 mg at 05/20/14 0825  . ciprofloxacin (CIPRO) tablet 500 mg  500 mg Oral BID Wandra Arthurs, MD  500 mg at 05/20/14 0824  . clopidogrel (PLAVIX) tablet 75 mg  75 mg Oral Daily Wandra Arthurs, MD   75 mg at 05/20/14 0825  . divalproex (DEPAKOTE ER) 24 hr tablet 500 mg  500 mg Oral BID Wandra Arthurs, MD   500 mg at 05/20/14 0623  . haloperidol (HALDOL) tablet 10 mg  10 mg Oral QHS Delfin Gant, NP   10 mg at 05/19/14 2104  . haloperidol (HALDOL) tablet 5 mg  5 mg Oral BID Delfin Gant, NP   5 mg at 05/20/14 0824  . hydrOXYzine (ATARAX/VISTARIL) tablet 50 mg  50 mg Oral QHS Waylan Boga, NP   50 mg at 05/19/14 2104  . ibuprofen (ADVIL,MOTRIN) tablet 600 mg  600 mg Oral Q8H PRN Carrie Mew, PA-C      . lamoTRIgine (LAMICTAL) tablet 200 mg  200 mg Oral Daily Waylan Boga, NP   200 mg at 05/20/14 0825  . levothyroxine (SYNTHROID, LEVOTHROID) tablet 75 mcg  75 mcg Oral QAC breakfast Waylan Boga, NP   75 mcg at 05/20/14 0824  . LORazepam (ATIVAN) tablet 1 mg  1 mg Oral Q8H PRN Carrie Mew, PA-C   1 mg at 05/18/14 1647  . nicotine  (NICODERM CQ - dosed in mg/24 hours) patch 21 mg  21 mg Transdermal Daily Carrie Mew, PA-C   21 mg at 05/18/14 1400  . ondansetron (ZOFRAN) tablet 4 mg  4 mg Oral Q8H PRN Carrie Mew, PA-C      . topiramate (TOPAMAX) tablet 25 mg  25 mg Oral BID Waylan Boga, NP   25 mg at 05/20/14 0824  . traZODone (DESYREL) tablet 100 mg  100 mg Oral QHS Waylan Boga, NP   100 mg at 05/19/14 2103  . white petrolatum (VASELINE) gel   Topical PRN Benjamine Mola, FNP       Current Outpatient Prescriptions  Medication Sig Dispense Refill  . aspirin EC 81 MG tablet Take 81 mg by mouth daily with breakfast.    . clopidogrel (PLAVIX) 75 MG tablet Take 75 mg by mouth daily.    . divalproex (DEPAKOTE ER) 500 MG 24 hr tablet TAKE 1 TABLET (500 MG TOTAL) BY MOUTH 2 (TWO) TIMES DAILY. 60 tablet 1  . haloperidol (HALDOL) 5 MG tablet TAKE 1 TAB TWICE A DAY AND 2 AT BED TIME. (Patient taking differently: Take 5-10 mg by mouth 3 (three) times daily. Takes $RemoveBefo'5mg'[id number]$  in the morning and evening, then $RemoveBefor'10mg'[id number]$  at bedtime) 120 tablet 1  . hydrOXYzine (ATARAX/VISTARIL) 50 MG tablet Take 1 tablet (50 mg total) by mouth at bedtime. 30 tablet 1  . lamoTRIgine (LAMICTAL) 200 MG tablet Take 1 tablet (200 mg total) by mouth daily. TAKE 1 TABLET (150 MG TOTAL) BY MOUTH AT BEDTIME. (Patient taking differently: Take 200 mg by mouth daily. ) 30 tablet 1  . topiramate (TOPAMAX) 25 MG tablet Take 25 mg by mouth 2 (two) times daily.     . traZODone (DESYREL) 100 MG tablet Take 1 tablet (100 mg total) by mouth at bedtime. 30 tablet 1  . levothyroxine (SYNTHROID, LEVOTHROID) 100 MCG tablet Take 1 tablet (100 mcg total) by mouth daily before breakfast. (Patient not taking: Reported on 05/18/2014)    . [DISCONTINUED] zolpidem (AMBIEN) 5 MG tablet Take 1 tablet (5 mg total) by mouth at bedtime as needed for sleep. 10 tablet 0    Psychiatric Specialty Exam:     Blood pressure 101/63,  pulse 67, temperature 98.2 F (36.8 C), temperature source Oral,  resp. rate 18, SpO2 98 %.There is no weight on file to calculate BMI.  General Appearance: Casual  Eye Contact::  Good  Speech:  Clear and Coherent and Normal Rate  Volume:  Normal  Mood:  Angry, Anxious, Depressed and Hopeless  Affect:  Congruent, Depressed, Flat and Tearful  Thought Process:  Coherent, Goal Directed and Intact  Orientation:  Full (Time, Place, and Person)  Thought Content:  suicidal, no plan  Suicidal Thoughts:  Yes.  without intent/plan  Homicidal Thoughts:  No  Memory:  Immediate;   Good  Judgement:  Fair  Insight:  Good  Psychomotor Activity:  Normal  Concentration:  Good  Recall:  NA  Fund of Knowledge:Good  Language: Good  Akathisia:  NA  Handed:  Right  AIMS (if indicated):     Assets:  Desire for Improvement  Sleep:      Musculoskeletal: Strength & Muscle Tone: within normal limits Gait & Station: normal Patient leans: N/A  Treatment Plan Summary: Daily contact with patient to assess and evaluate symptoms and progress in treatment Medication management./ Patient have been accepted at [facility] and will be transferred as soon as transportation is available.  Delfin Gant   PMHNP-BC 05/20/2014 5:50 PM   Patient seen, evaluated and I agree with notes by Nurse Practitioner. Corena Pilgrim, MD

## 2014-05-20 NOTE — ED Notes (Signed)
Patient stable for discharge. Transferred with sheriff to [facility] at this time.

## 2014-05-21 LAB — LAMOTRIGINE LEVEL: Lamotrigine Lvl: 9 ug/mL (ref 4.0–18.0)

## 2014-06-01 ENCOUNTER — Ambulatory Visit (INDEPENDENT_AMBULATORY_CARE_PROVIDER_SITE_OTHER): Payer: BLUE CROSS/BLUE SHIELD | Admitting: Psychiatry

## 2014-06-01 ENCOUNTER — Encounter (HOSPITAL_COMMUNITY): Payer: Self-pay | Admitting: Psychiatry

## 2014-06-01 VITALS — BP 94/70 | HR 76 | Ht 71.0 in | Wt 203.6 lb

## 2014-06-01 DIAGNOSIS — F200 Paranoid schizophrenia: Secondary | ICD-10-CM

## 2014-06-01 DIAGNOSIS — F4310 Post-traumatic stress disorder, unspecified: Secondary | ICD-10-CM

## 2014-06-01 MED ORDER — BENZTROPINE MESYLATE 1 MG PO TABS: 1.0000 mg | ORAL_TABLET | Freq: Two times a day (BID) | ORAL | Status: DC

## 2014-06-01 MED ORDER — TRAZODONE HCL 150 MG PO TABS: 150.0000 mg | ORAL_TABLET | Freq: Every day | ORAL | Status: DC

## 2014-06-01 MED ORDER — HALOPERIDOL 5 MG PO TABS: ORAL_TABLET | ORAL | Status: DC

## 2014-06-01 NOTE — Progress Notes (Signed)
[facility] [phone] Progress Note  Danielle [name] [id number] 44 y.o.  06/01/2014 1:57 PM  Chief Complaint:  I was admitted at old [facility] and released on Monday.         History of Present Illness:  Danielle [name] came for her followup appointment with her husband.   She was again admitted at old [facility] on generally 15th because of suicidal thoughts and increase in the hallucination.  She was seen in the office one week before her admission .  She was complaining of increased anxiety since she started counseling.  Her medications were again changed at old [facility] .  She is no longer taking Lamictal and hydroxyzine.  Her Haldol was decreased and her trazodone is increased.  Patient is sleeping better.  She continues to have hallucination but it is less intense and less frequent from the past.  She endorse nightmares, dreams and some time paranoia.  However she denies any suicidal thoughts or homicidal thought.  There has been a lot of changes in her medication in past few months.  She has more struggle in last few weeks since she started counseling .  However she does not want to stop therapy because she realizes it will help her eventually.  Her husband is very supportive.  She is taking Depakote as prescribed.  Her last Depakote level was 99 however 1 day before this level was taken her Depakote was very low.  Patient denies any agitation, anger, violence or any self abusive behavior.  She lives with her husband who is very supportive.  Her appetite is okay.  Her vitals are stable.    Suicidal Ideation: No Plan Formed: No Patient has means to carry out plan: No  Homicidal Ideation: No Plan Formed: No Patient has means to carry out plan: No   Psychiatric: Agitation: No Hallucination: Yes Depressed Mood: No Insomnia: Yes Hypersomnia: No Altered Concentration: No Feels Worthless: No Grandiose Ideas: No Belief In Special Powers: No New/Increased Substance  Abuse: No Compulsions: No  Neurologic: Headache: Yes Seizure: Yes Paresthesias: No  Medical history Patient has history of sleep apnea, seizure disorder, obesity, hypothyroidism and headache.  Her primary care physician is Dr. Owens Shark.  She is seeing Dr. Maude Leriche at [facility] neurology for the management of seizures.    Outpatient Encounter Prescriptions as of 06/01/2014  Medication Sig  . aspirin EC 81 MG tablet Take 81 mg by mouth daily with breakfast.  . benztropine (COGENTIN) 1 MG tablet Take 1 tablet (1 mg total) by mouth 2 (two) times daily.  . clopidogrel (PLAVIX) 75 MG tablet Take 75 mg by mouth daily.  . divalproex (DEPAKOTE ER) 500 MG 24 hr tablet TAKE 1 TABLET (500 MG TOTAL) BY MOUTH 2 (TWO) TIMES DAILY.  . haloperidol (HALDOL) 5 MG tablet TAKE 1 TAB AM AND 2 AT BED TIME.  Marland Kitchen levothyroxine (SYNTHROID, LEVOTHROID) 100 MCG tablet Take 1 tablet (100 mcg total) by mouth daily before breakfast. (Patient not taking: Reported on 05/18/2014)  . topiramate (TOPAMAX) 25 MG tablet Take 25 mg by mouth 2 (two) times daily.   . traZODone (DESYREL) 150 MG tablet Take 1 tablet (150 mg total) by mouth at bedtime.  . [DISCONTINUED] haloperidol (HALDOL) 5 MG tablet TAKE 1 TAB TWICE A DAY AND 2 AT BED TIME. (Patient taking differently: Take 5-10 mg by mouth 3 (three) times daily. Takes 26m in the morning and evening, then 183mat bedtime)  . [DISCONTINUED] hydrOXYzine (ATARAX/VISTARIL) 50 MG tablet  Take 1 tablet (50 mg total) by mouth at bedtime.  . [DISCONTINUED] lamoTRIgine (LAMICTAL) 200 MG tablet Take 1 tablet (200 mg total) by mouth daily. TAKE 1 TABLET (150 MG TOTAL) BY MOUTH AT BEDTIME. (Patient taking differently: Take 200 mg by mouth daily. )  . [DISCONTINUED] traZODone (DESYREL) 100 MG tablet Take 1 tablet (100 mg total) by mouth at bedtime.    Past Psychiatric History/Hospitalization(s): Patient has multiple hospitalization .  Her last discharge was May 30, 2014 from old [facility].  She has history of cutting herself and drowning herself .  In the past she had tried Zoloft, amitriptyline, Wellbutrin and Invega.  Anxiety: Yes Bipolar Disorder: No Depression: Yes Mania: No Psychosis: Yes Schizophrenia: Yes Personality Disorder: No Hospitalization for psychiatric illness: Yes History of Electroconvulsive Shock Therapy: No Prior Suicide Attempts: No  Physical Exam: Constitutional:  BP 94/70 mmHg  Pulse 76  Ht _0  (1.803 m)  Wt 203 lb 9.6 oz (92.352 kg)  BMI 28.41 kg/m2  General Appearance: Patient is obese female who is fairly groomed.  She is minimally cooperative but I'm not acute distress.  Recent Results (from the past 2160 hour(s))  Acetaminophen level     Status: Abnormal   Collection Time: 05/18/14 11:40 AM  Result Value Ref Range   Acetaminophen (Tylenol), Serum <10.0 (L) 10 - 30 ug/mL    Comment:        THERAPEUTIC CONCENTRATIONS VARY SIGNIFICANTLY. A RANGE OF 10-30 ug/mL MAY BE AN EFFECTIVE CONCENTRATION FOR MANY PATIENTS. HOWEVER, SOME ARE BEST TREATED AT CONCENTRATIONS OUTSIDE THIS RANGE. ACETAMINOPHEN CONCENTRATIONS >150 ug/mL AT 4 HOURS AFTER INGESTION AND >50 ug/mL AT 12 HOURS AFTER INGESTION ARE OFTEN ASSOCIATED WITH TOXIC REACTIONS.   CBC     Status: None   Collection Time: 05/18/14 11:40 AM  Result Value Ref Range   WBC 5.9 4.0 - 10.5 K/uL   RBC 3.94 3.87 - 5.11 MIL/uL   Hemoglobin 12.2 12.0 - 15.0 g/dL   HCT 36.3 36.0 - 46.0 %   MCV 92.1 78.0 - 100.0 fL   MCH 31.0 26.0 - 34.0 pg   MCHC 33.6 30.0 - 36.0 g/dL   RDW 13.0 11.5 - 15.5 %   Platelets 237 150 - 400 K/uL  Comprehensive metabolic panel     Status: Abnormal   Collection Time: 05/18/14 11:40 AM  Result Value Ref Range   Sodium 136 135 - 145 mmol/L    Comment: Please note change in reference range.   Potassium 4.2 3.5 - 5.1 mmol/L    Comment: Please note change in reference range.   Chloride 108 96 - 112 mEq/L   CO2 20 19 - 32 mmol/L   Glucose, Bld  103 (H) 70 - 99 mg/dL   BUN 21 6 - 23 mg/dL   Creatinine, Ser 0.87 0.50 - 1.10 mg/dL   Calcium 9.1 8.4 - 10.5 mg/dL   Total Protein 7.1 6.0 - 8.3 g/dL   Albumin 4.1 3.5 - 5.2 g/dL   AST 15 0 - 37 U/L   ALT 11 0 - 35 U/L   Alkaline Phosphatase 63 39 - 117 U/L   Total Bilirubin 0.3 0.3 - 1.2 mg/dL   GFR calc non Af Amer 79 (L) >90 mL/min   GFR calc Af Amer >90 >90 mL/min    Comment: (NOTE) The eGFR has been calculated using the CKD EPI equation. This calculation has not been validated in all clinical situations. eGFR's persistently <90 mL/min signify possible  Chronic Kidney Disease.    Anion gap 8 5 - 15  Ethanol (ETOH)     Status: None   Collection Time: 05/18/14 11:40 AM  Result Value Ref Range   Alcohol, Ethyl (B) <5 0 - 9 mg/dL    Comment:        LOWEST DETECTABLE LIMIT FOR SERUM ALCOHOL IS 11 mg/dL FOR MEDICAL PURPOSES ONLY   Salicylate level     Status: None   Collection Time: 05/18/14 11:40 AM  Result Value Ref Range   Salicylate Lvl <8.1 2.8 - 20.0 mg/dL  Urine Drug Screen     Status: None   Collection Time: 05/18/14 11:48 AM  Result Value Ref Range   Opiates NONE DETECTED NONE DETECTED   Cocaine NONE DETECTED NONE DETECTED   Benzodiazepines NONE DETECTED NONE DETECTED   Amphetamines NONE DETECTED NONE DETECTED   Tetrahydrocannabinol NONE DETECTED NONE DETECTED   Barbiturates NONE DETECTED NONE DETECTED    Comment:        DRUG SCREEN FOR MEDICAL PURPOSES ONLY.  IF CONFIRMATION IS NEEDED FOR ANY PURPOSE, NOTIFY LAB WITHIN 5 DAYS.        LOWEST DETECTABLE LIMITS FOR URINE DRUG SCREEN Drug Class       Cutoff (ng/mL) Amphetamine      1000 Barbiturate      200 Benzodiazepine   275 Tricyclics       170 Opiates          300 Cocaine          300 THC              50   Urine culture     Status: None   Collection Time: 05/18/14 11:48 AM  Result Value Ref Range   Specimen Description URINE, CLEAN CATCH    Special Requests NONE    Colony Count      70,000  COLONIES/ML Performed at [facility]    Culture      Multiple bacterial morphotypes present, none predominant. Suggest appropriate recollection if clinically indicated. Performed at [facility]    Report Status 05/20/2014 FINAL   Urinalysis, Routine w reflex microscopic     Status: Abnormal   Collection Time: 05/18/14  7:44 PM  Result Value Ref Range   Color, Urine YELLOW YELLOW   APPearance CLEAR CLEAR   Specific Gravity, Urine 1.016 1.005 - 1.030   pH 7.0 5.0 - 8.0   Glucose, UA NEGATIVE NEGATIVE mg/dL   Hgb urine dipstick TRACE (A) NEGATIVE   Bilirubin Urine NEGATIVE NEGATIVE   Ketones, ur NEGATIVE NEGATIVE mg/dL   Protein, ur NEGATIVE NEGATIVE mg/dL   Urobilinogen, UA 0.2 0.0 - 1.0 mg/dL   Nitrite NEGATIVE NEGATIVE   Leukocytes, UA MODERATE (A) NEGATIVE  Urine microscopic-add on     Status: Abnormal   Collection Time: 05/18/14  7:44 PM  Result Value Ref Range   Squamous Epithelial / LPF RARE RARE   WBC, UA 7-10 <3 WBC/hpf   RBC / HPF 3-6 <3 RBC/hpf   Bacteria, UA MANY (A) RARE  Valproic acid level     Status: Abnormal   Collection Time: 05/19/14  8:45 AM  Result Value Ref Range   Valproic Acid Lvl 28.3 (L) 50.0 - 100.0 ug/mL    Comment: Performed at [facility]  Lamotrigine level     Status: None   Collection Time: 05/19/14  8:45 AM  Result Value Ref Range   Lamotrigine Lvl 9.0 4.0 - 18.0  mcg/mL    Comment: Performed at [facility]  Valproic acid level     Status: None   Collection Time: 05/20/14  5:30 AM  Result Value Ref Range   Valproic Acid Lvl 99.0 50.0 - 100.0 ug/mL    Comment: Performed at [facility]    Musculoskeletal: Strength & Muscle Tone: Patient is in a wheelchair because of wearing foot. Gait & Station: She is using wheelchair because of the pain and for cast Patient leans: N/A Review of Systems  Psychiatric/Behavioral: Positive for hallucinations. The patient is nervous/anxious.    Mental status  examination  Patient is fairly groomed and dressed.  She is cooperative and maintained fair eye contact.  Her speech is fast and at times rambling .  She holds her husband hand in the session.  Her psychomotor activity is decreased.  Her attention and concentration is remains distracted.  She denies any active or passive suicidal thoughts or homicidal thought.  She endorse hallucination but denies any obsessive thoughts or any delusions.  She has no tremors, shakes or any EPS.  Her thought process is circumstantial.  Her psychomotor activity is decreased.  Her fund of knowledge is below average.  She's alert and oriented x3. Her insight judgment is fair. Her impulse control is okay  Review of Psycho-Social Stressors (1), Review or order clinical lab tests (1), Decision to obtain old records (1), Review and summation of old records (2), Established Problem, Worsening (2), Review of Last Therapy Session (1), Review of Medication Regimen & Side Effects (2) and Review of New Medication or Change in Dosage (2)  Assessment: Axis I: Schizophrenia chronic paranoid type,  Posttraumatic stress disorder  Axis II: Deferred  Axis III: Hypothyroidism, headache, abstract of sleep apnea, obesity and seizure disorder  Plan:  Patient continues to have result symptoms of psychosis and depression however she does not have any suicidal thoughts.  I review her discharge paper from old [facility] and recent blood work from emergency room.  She is not taking Cogentin 1 mg twice a day , Haldol 5 mg in the morning and 10 mg at bedtime, Depakote 500 mg twice a day and trazodone 150 mg at bedtime.  Her Lamictal and Vistaril has been discontinued.  She is getting Topamax from her neurologist.  Despite in the beginning worsening of the symptoms because of counseling I encouraged her to continue counseling which she agreed because it may help her long-term.  I discuss the risk and benefits of medication.  Recommended to call  us back if she has any worsening of the symptoms.  I will see her again in 4 weeks.  Time spent 25 minutes.  More than 50% of the time spent in psychoeducation, counseling and coordination of care.  Discuss safety plan that anytime having active suicidal thoughts or homicidal thoughts then patient need to call 911 or go to the local emergency room.  Danielle Shadoan T., MD 06/01/2014

## 2014-06-15 ENCOUNTER — Ambulatory Visit (HOSPITAL_COMMUNITY): Payer: Self-pay | Admitting: Clinical

## 2014-06-28 ENCOUNTER — Ambulatory Visit (INDEPENDENT_AMBULATORY_CARE_PROVIDER_SITE_OTHER): Payer: BLUE CROSS/BLUE SHIELD | Admitting: Clinical

## 2014-06-28 ENCOUNTER — Encounter (HOSPITAL_COMMUNITY): Payer: Self-pay | Admitting: Clinical

## 2014-06-28 DIAGNOSIS — F4310 Post-traumatic stress disorder, unspecified: Secondary | ICD-10-CM | POA: Diagnosis not present

## 2014-06-28 DIAGNOSIS — F200 Paranoid schizophrenia: Secondary | ICD-10-CM

## 2014-06-29 ENCOUNTER — Ambulatory Visit (INDEPENDENT_AMBULATORY_CARE_PROVIDER_SITE_OTHER): Payer: BLUE CROSS/BLUE SHIELD | Admitting: Psychiatry

## 2014-06-29 ENCOUNTER — Other Ambulatory Visit (HOSPITAL_COMMUNITY): Payer: Self-pay | Admitting: Psychiatry

## 2014-06-29 ENCOUNTER — Encounter (HOSPITAL_COMMUNITY): Payer: Self-pay | Admitting: Clinical

## 2014-06-29 ENCOUNTER — Encounter (HOSPITAL_COMMUNITY): Payer: Self-pay | Admitting: Psychiatry

## 2014-06-29 VITALS — BP 82/62 | HR 74 | Ht 71.0 in | Wt 196.6 lb

## 2014-06-29 DIAGNOSIS — F200 Paranoid schizophrenia: Secondary | ICD-10-CM

## 2014-06-29 DIAGNOSIS — F4310 Post-traumatic stress disorder, unspecified: Secondary | ICD-10-CM

## 2014-06-29 MED ORDER — TRAZODONE HCL 100 MG PO TABS: 100.0000 mg | ORAL_TABLET | Freq: Every day | ORAL | Status: DC

## 2014-06-29 MED ORDER — BENZTROPINE MESYLATE 0.5 MG PO TABS: 0.5000 mg | ORAL_TABLET | Freq: Two times a day (BID) | ORAL | Status: DC

## 2014-06-29 NOTE — Progress Notes (Signed)
[facility] [phone] Progress Note  Danielle [name] [id number] 44 y.o.  06/29/2014 10:32 AM  Chief Complaint:  I was again admitted for low blood pressure.           History of Present Illness:  Danielle [name] came for her followup appointment with her husband.   Patient was admitted at [facility] 2 days ago because of low blood pressure.  She was feeling weak, tired and complaining of leg cramps.  She was given IV fluids however no medicines were changed.  Today her blood pressure is 80/60 and her pulse was 72.  Her husband told that she's been running this blood pressure for past few days and sometimes she does complain of dizziness and feeling tired but no leShe was a.  g cramps.  She scheduled to see her primary care physician a few days who is also doing more testing to rule out causes of low blood pressure.  Patient and her husband told that none of the physician believed that psychotropic medication causing low blood pressure.  However she is taking multiple psychotropic medication that could be contrgain admittlow blood pressure.  Overall patient is doing better psychiatrically.  Her hallucination and paranoia is less intense and less frequent.  She sleeping good.  She denies any suicidal thoughts.  She still have nightmares and flashback but they're less intense and less frequent from the past.  She started seeing a counselor in this office and that is also helping her a lot.  She endorse her appetite is okay but sometimes she does get fatigued and tired.  Patient and her husband reluctant to change psychotropic medication however I do believe reducing trazodone and Cogentin may help improve per pressure.  Suicidal Ideation: No Plan Formed: No Patient has means to carry out plan: No  Homicidal Ideation: No Plan Formed: No Patient has means to carry out plan: No   Psychiatric: Agitation: No Hallucination: Yes Depressed Mood: No Insomnia: No Hypersomnia: No Altered  Concentration: No Feels Worthless: No Grandiose Ideas: No Belief In Special Powers: No New/Increased Substance Abuse: No Compulsions: No  Neurologic: Headache: Yes Seizure: Yes Paresthesias: No  Medical history Patient has history of sleep apnea, seizure disorder, obesity, hypothyroidism and headache.  Her primary care physician is Dr. Owens Shark.  She is seeing Dr. Maude Leriche at [facility] neurology for the management of seizures.    Outpatient Encounter Prescriptions as of 06/29/2014  Medication Sig  . aspirin EC 81 MG tablet Take 81 mg by mouth daily with breakfast.  . benztropine (COGENTIN) 0.5 MG tablet Take 1 tablet (0.5 mg total) by mouth 2 (two) times daily.  . clopidogrel (PLAVIX) 75 MG tablet Take 75 mg by mouth daily.  . divalproex (DEPAKOTE ER) 500 MG 24 hr tablet TAKE 1 TABLET (500 MG TOTAL) BY MOUTH 2 (TWO) TIMES DAILY.  . haloperidol (HALDOL) 5 MG tablet TAKE 1 TAB AM AND 2 AT BED TIME.  Marland Kitchen levothyroxine (SYNTHROID, LEVOTHROID) 100 MCG tablet Take 1 tablet (100 mcg total) by mouth daily before breakfast.  . topiramate (TOPAMAX) 25 MG tablet Take 25 mg by mouth 2 (two) times daily.   . traZODone (DESYREL) 100 MG tablet Take 1 tablet (100 mg total) by mouth at bedtime.  . [DISCONTINUED] benztropine (COGENTIN) 1 MG tablet Take 1 tablet (1 mg total) by mouth 2 (two) times daily.  . [DISCONTINUED] traZODone (DESYREL) 150 MG tablet Take 1 tablet (150 mg total) by mouth at bedtime.    Past Psychiatric History/Hospitalization(s):  Patient has multiple hospitalization .  Her last discharge was May 30, 2014 from old [facility].  She has history of cutting herself and drowning herself .  In the past she had tried Zoloft, amitriptyline, Wellbutrin and Invega.  Anxiety: Yes Bipolar Disorder: No Depression: Yes Mania: No Psychosis: Yes Schizophrenia: Yes Personality Disorder: No Hospitalization for psychiatric illness: Yes History of Electroconvulsive Shock Therapy: No Prior  Suicide Attempts: No  Physical Exam: Constitutional:  BP 82/62 mmHg  Pulse 74  Ht _0  (1.803 m)  Wt 196 lb 9.6 oz (89.177 kg)  BMI 27.43 kg/m2  General Appearance: Patient is obese female who is fairly groomed.  She is minimally cooperative but I'm not acute distress.  Recent Results (from the past 2160 hour(s))  Acetaminophen level     Status: Abnormal   Collection Time: 05/18/14 11:40 AM  Result Value Ref Range   Acetaminophen (Tylenol), Serum <10.0 (L) 10 - 30 ug/mL    Comment:        THERAPEUTIC CONCENTRATIONS VARY SIGNIFICANTLY. A RANGE OF 10-30 ug/mL MAY BE AN EFFECTIVE CONCENTRATION FOR MANY PATIENTS. HOWEVER, SOME ARE BEST TREATED AT CONCENTRATIONS OUTSIDE THIS RANGE. ACETAMINOPHEN CONCENTRATIONS >150 ug/mL AT 4 HOURS AFTER INGESTION AND >50 ug/mL AT 12 HOURS AFTER INGESTION ARE OFTEN ASSOCIATED WITH TOXIC REACTIONS.   CBC     Status: None   Collection Time: 05/18/14 11:40 AM  Result Value Ref Range   WBC 5.9 4.0 - 10.5 K/uL   RBC 3.94 3.87 - 5.11 MIL/uL   Hemoglobin 12.2 12.0 - 15.0 g/dL   HCT 36.3 36.0 - 46.0 %   MCV 92.1 78.0 - 100.0 fL   MCH 31.0 26.0 - 34.0 pg   MCHC 33.6 30.0 - 36.0 g/dL   RDW 13.0 11.5 - 15.5 %   Platelets 237 150 - 400 K/uL  Comprehensive metabolic panel     Status: Abnormal   Collection Time: 05/18/14 11:40 AM  Result Value Ref Range   Sodium 136 135 - 145 mmol/L    Comment: Please note change in reference range.   Potassium 4.2 3.5 - 5.1 mmol/L    Comment: Please note change in reference range.   Chloride 108 96 - 112 mEq/L   CO2 20 19 - 32 mmol/L   Glucose, Bld 103 (H) 70 - 99 mg/dL   BUN 21 6 - 23 mg/dL   Creatinine, Ser 0.87 0.50 - 1.10 mg/dL   Calcium 9.1 8.4 - 10.5 mg/dL   Total Protein 7.1 6.0 - 8.3 g/dL   Albumin 4.1 3.5 - 5.2 g/dL   AST 15 0 - 37 U/L   ALT 11 0 - 35 U/L   Alkaline Phosphatase 63 39 - 117 U/L   Total Bilirubin 0.3 0.3 - 1.2 mg/dL   GFR calc non Af Amer 79 (L) >90 mL/min   GFR calc Af Amer >90  >90 mL/min    Comment: (NOTE) The eGFR has been calculated using the CKD EPI equation. This calculation has not been validated in all clinical situations. eGFR's persistently <90 mL/min signify possible Chronic Kidney Disease.    Anion gap 8 5 - 15  Ethanol (ETOH)     Status: None   Collection Time: 05/18/14 11:40 AM  Result Value Ref Range   Alcohol, Ethyl (B) <5 0 - 9 mg/dL    Comment:        LOWEST DETECTABLE LIMIT FOR SERUM ALCOHOL IS 11 mg/dL FOR MEDICAL PURPOSES ONLY   Salicylate level  Status: None   Collection Time: 05/18/14 11:40 AM  Result Value Ref Range   Salicylate Lvl <4.2 2.8 - 20.0 mg/dL  Urine Drug Screen     Status: None   Collection Time: 05/18/14 11:48 AM  Result Value Ref Range   Opiates NONE DETECTED NONE DETECTED   Cocaine NONE DETECTED NONE DETECTED   Benzodiazepines NONE DETECTED NONE DETECTED   Amphetamines NONE DETECTED NONE DETECTED   Tetrahydrocannabinol NONE DETECTED NONE DETECTED   Barbiturates NONE DETECTED NONE DETECTED    Comment:        DRUG SCREEN FOR MEDICAL PURPOSES ONLY.  IF CONFIRMATION IS NEEDED FOR ANY PURPOSE, NOTIFY LAB WITHIN 5 DAYS.        LOWEST DETECTABLE LIMITS FOR URINE DRUG SCREEN Drug Class       Cutoff (ng/mL) Amphetamine      1000 Barbiturate      200 Benzodiazepine   683 Tricyclics       419 Opiates          300 Cocaine          300 THC              50   Urine culture     Status: None   Collection Time: 05/18/14 11:48 AM  Result Value Ref Range   Specimen Description URINE, CLEAN CATCH    Special Requests NONE    Colony Count      70,000 COLONIES/ML Performed at [facility]    Culture      Multiple bacterial morphotypes present, none predominant. Suggest appropriate recollection if clinically indicated. Performed at [facility]    Report Status 05/20/2014 FINAL   Urinalysis, Routine w reflex microscopic     Status: Abnormal   Collection Time: 05/18/14  7:44 PM  Result Value Ref  Range   Color, Urine YELLOW YELLOW   APPearance CLEAR CLEAR   Specific Gravity, Urine 1.016 1.005 - 1.030   pH 7.0 5.0 - 8.0   Glucose, UA NEGATIVE NEGATIVE mg/dL   Hgb urine dipstick TRACE (A) NEGATIVE   Bilirubin Urine NEGATIVE NEGATIVE   Ketones, ur NEGATIVE NEGATIVE mg/dL   Protein, ur NEGATIVE NEGATIVE mg/dL   Urobilinogen, UA 0.2 0.0 - 1.0 mg/dL   Nitrite NEGATIVE NEGATIVE   Leukocytes, UA MODERATE (A) NEGATIVE  Urine microscopic-add on     Status: Abnormal   Collection Time: 05/18/14  7:44 PM  Result Value Ref Range   Squamous Epithelial / LPF RARE RARE   WBC, UA 7-10 <3 WBC/hpf   RBC / HPF 3-6 <3 RBC/hpf   Bacteria, UA MANY (A) RARE  Valproic acid level     Status: Abnormal   Collection Time: 05/19/14  8:45 AM  Result Value Ref Range   Valproic Acid Lvl 28.3 (L) 50.0 - 100.0 ug/mL    Comment: Performed at [facility]  Lamotrigine level     Status: None   Collection Time: 05/19/14  8:45 AM  Result Value Ref Range   Lamotrigine Lvl 9.0 4.0 - 18.0 mcg/mL    Comment: Performed at [facility]  Valproic acid level     Status: None   Collection Time: 05/20/14  5:30 AM  Result Value Ref Range   Valproic Acid Lvl 99.0 50.0 - 100.0 ug/mL    Comment: Performed at [facility]    Musculoskeletal: Strength & Muscle Tone: Patient is in a wheelchair because of wearing foot. Gait & Station: She is using  wheelchair because of the pain and for cast Patient leans: N/A Review of Systems  Constitutional: Positive for malaise/fatigue.  Skin: Negative for itching and rash.  Neurological: Positive for dizziness and headaches.  Psychiatric/Behavioral: Positive for hallucinations. Negative for suicidal ideas and substance abuse. The patient does not have insomnia.    Mental status examination  Patient is fairly groomed and dressed.  She is cooperative and maintained fair eye contact.  Her speech is fast and at times rambling .  Her psychomotor activity is  decreased.   she described her mood tired and her affect is mood appropriate.  Her attention and concentration is remains distracted.  She denies any active or passive suicidal thoughts or homicidal thought.  She endorse hallucination but denies any obsessive thoughts or any delusions.  She has no tremors, shakes or any EPS.  Her thought process is circumstantial.  Her psychomotor activity is decreased.  Her fund of knowledge is below average.  She's alert and oriented x3. Her insight judgment is fair. Her impulse control is okay  Review of Psycho-Social Stressors (1), Review or order clinical lab tests (1), Review and summation of old records (2), New Problem, with no additional work-up planned (3), Review of Last Therapy Session (1), Review of Medication Regimen & Side Effects (2) and Review of New Medication or Change in Dosage (2)  Assessment: Axis I: Schizophrenia chronic paranoid type,  Posttraumatic stress disorder  Axis II: Deferred  Axis III: Hypothyroidism, headache, abstract of sleep apnea, obesity and seizure disorder  Plan:   I reviewed records from her previous hospitalization due to low blood pressure.  She also has anemia .  Her blood sugar was normal.  I discussed with the patient and her husband to try low-dose trazodone and Cogentin to see if her depression gets better.  After some encouragement patient agreed.  I will reduce trazodone 100 mg at bedtime and Cogentin 0.5 mg twice a day.  Patient does not want to change her Depakote and Haldol dosage.  I recommended to continue counseling in this office.  Recommended to call us back if she has any question or any concern.  Her last Depakote level was 99.  She has no tremors or shakes.  Patient is scheduled to see her primary care physician for further workup and to rule out the cause of anemia.  She is getting Topamax from her neurologist.  I will see her again in 2 months .   Time spent 25 minutes.  More than 50% of the time spent in  psychoeducation, counseling and coordination of care.  Discuss safety plan that anytime having active suicidal thoughts or homicidal thoughts then patient need to call 911 or go to the local emergency room.  Tayvien Kane T., MD 06/29/2014

## 2014-06-29 NOTE — Progress Notes (Signed)
   THERAPIST PROGRESS NOTE  Session Time: 9:05 - 9:50  Participation Level: Active as could  Behavioral Response: DisheveledDrowsyDepressed  Type of Therapy: Individual Therapy  Treatment Goals addressed: improve psychiatric symptoms  Interventions: Motivational Interviewing and Other: Grounding and mindfulness  Summary: Danielle [name] is a 44 y.o. female who presents with Schizophrenia and PTSD.   Suicidal/Homicidal: Nowithout intent/plan  Therapist Response:  Stefannie met with clinician for an individual session. Agam discussed her psychiatric symptoms and her current life events. Nayzeth's husband accompanied her to this appointment. Trinady shared that she was upset with clinician. The last time Latania was in clinician's office Christiann was suicidal and experiencing a lot of hallucinations. Clinician walked with Florentine and Husband to the second floor to be admitted in inpatient care. Client and husband reported that Sameen was not admitted but was instead transferred to [facility]. Husband explained that the remodel upstairs had limited the beds and the transfer was necessary. Lunah was upset because she had felt clinician had lied to her about being admitted upstairs. Client and clinician discussed the situation and clinician apologized to [facility] for the change. Kerianna and clinician discussed her return home. Charrie shared that she had been sick lately with a flu. Husband reported that he also had the flu and would be returning to work today. Dajane and clinician discussed her healthy coping skills. Reverie shared that she was having trouble remembering them. Client and clinician reviewed some of her grounding techniques together. Clinician introduced a new one. Client agreed to practice them until next session.   Plan: Return again in 2 weeks.  Diagnosis: Axis I: Schizophrenia and PTSD       Letica Giaimo A, LCSW 06/29/2014

## 2014-07-12 ENCOUNTER — Ambulatory Visit (HOSPITAL_COMMUNITY): Payer: Self-pay | Admitting: Psychiatry

## 2014-07-13 ENCOUNTER — Encounter (HOSPITAL_COMMUNITY): Payer: Self-pay | Admitting: Clinical

## 2014-07-13 ENCOUNTER — Ambulatory Visit (INDEPENDENT_AMBULATORY_CARE_PROVIDER_SITE_OTHER): Payer: BLUE CROSS/BLUE SHIELD | Admitting: Clinical

## 2014-07-13 DIAGNOSIS — F4310 Post-traumatic stress disorder, unspecified: Secondary | ICD-10-CM

## 2014-07-13 DIAGNOSIS — F200 Paranoid schizophrenia: Secondary | ICD-10-CM | POA: Diagnosis not present
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
## 2014-07-26 ENCOUNTER — Encounter (HOSPITAL_COMMUNITY): Payer: Self-pay | Admitting: Clinical

## 2014-07-26 ENCOUNTER — Ambulatory Visit (INDEPENDENT_AMBULATORY_CARE_PROVIDER_SITE_OTHER): Payer: BLUE CROSS/BLUE SHIELD | Admitting: Clinical

## 2014-07-26 DIAGNOSIS — F4310 Post-traumatic stress disorder, unspecified: Secondary | ICD-10-CM

## 2014-07-26 DIAGNOSIS — F200 Paranoid schizophrenia: Secondary | ICD-10-CM

## 2014-07-26 NOTE — Progress Notes (Signed)
   THERAPIST PROGRESS NOTE  Session Time: 9:04 -10:02  Participation Level: Active  Behavioral Response: DisheveledAlertNA  Type of Therapy: Individual Therapy  Treatment Goals addressed: Improve Psychiatric Symptoms, Emotional Regulation Skills, Calming Skills, Healthy Coping Skills  Interventions: Motivational Interviewing, Cognitive Behavior Therapy and Grounding/Mindfulness Skills  Summary: Danielle [name] is a 44 y.o. female who presents with Schizophrenia and PTSD.   Suicidal/Homicidal: No - without intent/plan  Therapist Response:  Danielle [name] met with clinician for an individual session. Danielle [name] discussed her psychiatric symptoms and her current life events. Danielle [name] shared that she was having less hallucinations than usual. She shared that she continues to have fear and anxiety about her upcoming move. She shared that she was going with her husband and son to visit her Mother in [name]. She shared that she was both happy and anxious about the idea. Danielle [name] shared she liked to go to the beach, but was anxious about seeing her Mother. Danielle [name] shared that they have had very sparse contact. She shared that it also reminded her that her father has passed away and that she misses him very much. Danielle [name]'s husband joined the session about 20 minutes in.Client and clinician discussed grounding techniques. Danielle [name] shared that she continues to practice and could use them if she gets too stressed at her mother's house.Her husband shared that they had bought Danielle [name] a very special necklace with her father's image engraved on it and [name] uses that to calm herself too. Danielle [name] shared her anxiety about moving to her new house. Client , husband and clinician discussed ways that they could make the transition easier. Client and clinician discussed other moves she has made and how she adjusted  Plan: Return again in 2 weeks.  Diagnosis:     Axis I: Schizophrenia and PTSD    Siah Steely A, LCSW 07/26/2014

## 2014-07-27 ENCOUNTER — Other Ambulatory Visit (HOSPITAL_COMMUNITY): Payer: Self-pay | Admitting: Psychiatry

## 2014-07-27 DIAGNOSIS — F200 Paranoid schizophrenia: Secondary | ICD-10-CM

## 2014-07-27 MED ORDER — HALOPERIDOL 5 MG PO TABS: ORAL_TABLET | ORAL | Status: DC

## 2014-07-27 NOTE — Telephone Encounter (Signed)
Met with Dr. Adele Schilder to question if okay to have patient's Haldol and Depakote ER refilled for 1 time as patient returns for next evaluation on 08/29/14.  Refill authorizations approved and reordered to [facility] in [place].

## 2014-08-12 ENCOUNTER — Ambulatory Visit (HOSPITAL_COMMUNITY): Payer: Self-pay | Admitting: Clinical

## 2014-08-20 ENCOUNTER — Other Ambulatory Visit (HOSPITAL_COMMUNITY): Payer: Self-pay | Admitting: Psychiatry

## 2014-08-22 NOTE — Telephone Encounter (Signed)
done

## 2014-08-22 NOTE — Telephone Encounter (Signed)
Dr. Lolly MustacheArfeen,   Called patient to check if she will have enough Haldol until her appointment on 08-29-14. Husband answered the phone, we have clearance to talk to husband. Husband stated Danielle [name] only has two tablets left.  Husband stated he is given Shikha the Haldol as prescribed, not sure why he is running out of medication.  Request for refill, will be out tomorrow, only two tablets left. Okay to refill?  Next appointment 08-29-14.

## 2014-08-23 ENCOUNTER — Ambulatory Visit (INDEPENDENT_AMBULATORY_CARE_PROVIDER_SITE_OTHER): Payer: BLUE CROSS/BLUE SHIELD | Admitting: Clinical

## 2014-08-23 DIAGNOSIS — F4310 Post-traumatic stress disorder, unspecified: Secondary | ICD-10-CM | POA: Diagnosis not present

## 2014-08-23 DIAGNOSIS — F200 Paranoid schizophrenia: Secondary | ICD-10-CM

## 2014-08-24 ENCOUNTER — Encounter (HOSPITAL_COMMUNITY): Payer: Self-pay | Admitting: Clinical

## 2014-08-24 NOTE — Progress Notes (Signed)
   THERAPIST PROGRESS NOTE  Session Time: 9:00 -9:56  Participation Level: Active  Behavioral Response: CasualAlert and LethargicNA  Type of Therapy: Individual Therapy  Treatment Goals addressed: Improve Psychiatric Symptoms, Emotional Regulation Skills,   Interventions: Motivational Interviewing and Cognitive Behavior Therapy   Summary: Danielle [name] is a 44 y.o. female who presents with Schizophrenia and PTSD.   Suicidal/Homicidal: No - without intent/plan  Therapist Response:  Raelea met with clinician for an individual session. Malesha discussed her psychiatric symptoms, her homework and her current life events. Nixie came to session with her husband and her favorite stuffed animal. Earleen shared that she was practicing her grounding techniques sometime. She shared that she had a nice visit with her mother which made her happy due to the previously strained relationship. She also shared about moving into her new home. Caelie stated that the she was feeling upset because of being caught in the middle of her husband and son when they were upset with each other. Client and clinician discussed the dynamics and her response. Client and clinician discussed her thoughts and her emotions. Client and clinician discussed how she could tell them that they would need to speak to each other. Aryiana and clinician discussed how Krysteena might convey when she is also upset with either of them. Client and clinician discussed healthy coping for the emotions that might arise telling them to speak to each other and while waiting for them to do so. Abbee seemed unsure of her ability to do so. Client and clinician agreed to talk about setting boundaries and dealing with emotions more in her future sessions.    Plan: Return again in 2 weeks.  Diagnosis:     Axis I: Schizophrenia and PTSD    Stevie Charter A, LCSW 08/24/2014

## 2014-08-29 ENCOUNTER — Ambulatory Visit (INDEPENDENT_AMBULATORY_CARE_PROVIDER_SITE_OTHER): Payer: BLUE CROSS/BLUE SHIELD | Admitting: Psychiatry

## 2014-08-29 ENCOUNTER — Encounter (HOSPITAL_COMMUNITY): Payer: Self-pay | Admitting: Psychiatry

## 2014-08-29 VITALS — BP 88/66 | HR 79 | Ht 70.5 in | Wt 194.6 lb

## 2014-08-29 DIAGNOSIS — F4310 Post-traumatic stress disorder, unspecified: Secondary | ICD-10-CM

## 2014-08-29 DIAGNOSIS — F200 Paranoid schizophrenia: Secondary | ICD-10-CM

## 2014-08-29 MED ORDER — TRAZODONE HCL 100 MG PO TABS: 100.0000 mg | ORAL_TABLET | Freq: Every day | ORAL | Status: DC

## 2014-08-29 MED ORDER — BENZTROPINE MESYLATE 0.5 MG PO TABS: 0.5000 mg | ORAL_TABLET | Freq: Two times a day (BID) | ORAL | Status: DC

## 2014-08-29 MED ORDER — DIVALPROEX SODIUM ER 500 MG PO TB24: ORAL_TABLET | ORAL | Status: DC
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
## 2014-09-14 ENCOUNTER — Encounter (HOSPITAL_COMMUNITY): Payer: Self-pay | Admitting: Clinical

## 2014-09-14 ENCOUNTER — Ambulatory Visit (INDEPENDENT_AMBULATORY_CARE_PROVIDER_SITE_OTHER): Payer: BLUE CROSS/BLUE SHIELD | Admitting: Clinical

## 2014-09-14 DIAGNOSIS — F200 Paranoid schizophrenia: Secondary | ICD-10-CM | POA: Diagnosis not present

## 2014-09-14 DIAGNOSIS — F4310 Post-traumatic stress disorder, unspecified: Secondary | ICD-10-CM

## 2014-09-14 NOTE — Progress Notes (Signed)
   THERAPIST PROGRESS NOTE  Session Time: 9:04- 9:59  Participation Level: Active  Behavioral Response: CasualAlertNA  Type of Therapy: Individual Therapy  Treatment Goals addressed: Improve Psychiatric Symptoms, Emotional Regulation Skills, Calming Skills, Healthy Coping Skills  Interventions: Motivational Interviewing, Cognitive Behavior Therapy and Grounding/Mindfulness Skills  Summary: Danielle [name] is a 44 y.o. female who presents with Schizophrenia and PTSD.   Suicidal/Homicidal: No - without intent/plan  Therapist Response:  [name] met with clinician for an individual session. [name] discussed her psychiatric symptoms, her current life events and her homework. [name] shared that she has continued to do her grounding techniques. Client and clinician discussed her keeping a gratitude journal. [name] agreed to do so until next session and also to continue her grounding techniques. Client and clinician discussed and practiced some grounding techniques together. [name] shared about adjusting to her new home. [name] had a lot of fear about the transition but today seemed calmer and smiled more than in prior sessions. [name] shared about her relationship with her son. She shared about her mother's day. She shared that she felt less stuck in the middle of her son and her husband. She shared that she was practicing her skills. [name] asked to work on deeper topics next session. Clinician agreed if [name] is ready next session then deeper topics could be approached and grounding techniques practiced.   Plan: Return again in 2 weeks.  Diagnosis:     Axis I: Schizophrenia and PTSD    Asaiah Hunnicutt A, LCSW 09/14/2014

## 2014-09-19 ENCOUNTER — Other Ambulatory Visit (HOSPITAL_COMMUNITY): Payer: Self-pay | Admitting: Psychiatry

## 2014-10-17 ENCOUNTER — Ambulatory Visit (INDEPENDENT_AMBULATORY_CARE_PROVIDER_SITE_OTHER): Payer: BLUE CROSS/BLUE SHIELD | Admitting: Clinical

## 2014-10-17 ENCOUNTER — Encounter (HOSPITAL_COMMUNITY): Payer: Self-pay | Admitting: Clinical

## 2014-10-17 DIAGNOSIS — F4310 Post-traumatic stress disorder, unspecified: Secondary | ICD-10-CM | POA: Diagnosis not present

## 2014-10-17 DIAGNOSIS — F200 Paranoid schizophrenia: Secondary | ICD-10-CM

## 2014-10-17 NOTE — Progress Notes (Signed)
   THERAPIST PROGRESS NOTE  Session Time: 11:04 -12:00  Participation Level: Active  Behavioral Response: Casual and DisheveledDrowsy and LethargicDepressed  Type of Therapy: Individual Therapy  Treatment Goals addressed: Improve Psychiatric Symptoms,  Healthy Coping Skills  Interventions: Motivational Interviewing, Cognitive Behavior Therapy and Grounding/Mindfulness Skills  Summary: Danielle [name] is a 44 y.o. female who presents with Schizophrenia and PTSD.   Suicidal/Homicidal: No - without intent/plan  Therapist Response:  [name] met with clinician for an individual session. [name] discussed her psychiatric symptoms and her current life events. [name] was more lethargic than usual and repeated her statements two and three times.[name] shared that she had a "medium" week. She shared that her mood had been "kinda blah". She shared that she did not have any really negative events tat have affected her mood. She shared that nothing really bad or really good went on. She shared that she is still adjusting to the new house. She shared that she had been feeling tired lately. She brought a stuffed animal into the session - a Tigger. She shared about her stuffed animal and how she uses it as a healthy coping skill. Client and clinician discussed about when it is used and how it helps. Client and clinician discussed other healthy coping skills. [name] and clinician discussed whether or not the same skills work on anxiety work on sadness. [name] shared that some did and some did not. Client and clinician discussed her thoughts about what made the difference. [name] shared that the sessions help her  Plan: Return again in 2 weeks.  Diagnosis:     Axis I: Schizophrenia and PTSD    Lavine Hargrove A, LCSW 10/17/2014

## 2014-10-20 ENCOUNTER — Encounter (HOSPITAL_COMMUNITY): Payer: Self-pay | Admitting: Clinical

## 2014-10-21 ENCOUNTER — Other Ambulatory Visit (HOSPITAL_COMMUNITY): Payer: Self-pay | Admitting: Psychiatry

## 2014-10-24 ENCOUNTER — Other Ambulatory Visit (HOSPITAL_COMMUNITY): Payer: Self-pay | Admitting: Psychiatry

## 2014-10-24 DIAGNOSIS — F200 Paranoid schizophrenia: Secondary | ICD-10-CM

## 2014-10-24 MED ORDER — DIVALPROEX SODIUM ER 500 MG PO TB24: ORAL_TABLET | ORAL | Status: DC

## 2014-10-26 ENCOUNTER — Telehealth (HOSPITAL_COMMUNITY): Payer: Self-pay

## 2014-10-26 ENCOUNTER — Ambulatory Visit (HOSPITAL_COMMUNITY): Payer: Self-pay | Admitting: Psychiatry

## 2014-10-26 DIAGNOSIS — F200 Paranoid schizophrenia: Secondary | ICD-10-CM

## 2014-10-26 MED ORDER — HALOPERIDOL 5 MG PO TABS: ORAL_TABLET | ORAL | Status: DC

## 2014-10-26 NOTE — Telephone Encounter (Signed)
Met with Dr. Adele Schilder who authorized a new order for patient's prescribed Haldol plus one refill as patient does not return for next appointment with Dr. Adele Schilder until 12/01/14.  Patient has an appointment with therapist on 11/14/14.  Spoke to Mr. Thull to inform of new order plus one refill sent to the [facility] in [place] as Mr. Remillard had requested.   Will see patient 12/01/14 and patient to call back if any problems prior to then.

## 2014-10-26 NOTE — Telephone Encounter (Signed)
Telephone message received from [facility] requesting a refill of patient's Haldol be sent into [facility] [place] as patient was rescheduled from 11/01/14 due to provider going to be out of the office that date and has no refills, last ordered for one month on 09/21/14.  Requests refill be sent this date.  Patient rescheduled with provider on 11/15/14.

## 2014-10-31 ENCOUNTER — Ambulatory Visit (INDEPENDENT_AMBULATORY_CARE_PROVIDER_SITE_OTHER): Payer: BLUE CROSS/BLUE SHIELD | Admitting: Clinical

## 2014-10-31 ENCOUNTER — Encounter (HOSPITAL_COMMUNITY): Payer: Self-pay | Admitting: Clinical

## 2014-10-31 ENCOUNTER — Other Ambulatory Visit (HOSPITAL_COMMUNITY): Payer: Self-pay | Admitting: *Deleted

## 2014-10-31 DIAGNOSIS — F4310 Post-traumatic stress disorder, unspecified: Secondary | ICD-10-CM | POA: Diagnosis not present

## 2014-10-31 DIAGNOSIS — F200 Paranoid schizophrenia: Secondary | ICD-10-CM

## 2014-10-31 MED ORDER — TRAZODONE HCL 100 MG PO TABS: 100.0000 mg | ORAL_TABLET | Freq: Every day | ORAL | Status: DC
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
## 2014-10-31 NOTE — Telephone Encounter (Signed)
Pt came for therapy appointment and asked that her Trazodone be called in for refill since her appointment had been rescheduled for July with Dr. Lolly MustacheArfeen. Chart reviewed, refill appropriate for 1 fill. [facility]

## 2014-11-01 ENCOUNTER — Ambulatory Visit (HOSPITAL_COMMUNITY): Payer: Self-pay | Admitting: Psychiatry

## 2014-11-14 ENCOUNTER — Ambulatory Visit (INDEPENDENT_AMBULATORY_CARE_PROVIDER_SITE_OTHER): Payer: BLUE CROSS/BLUE SHIELD | Admitting: Clinical

## 2014-11-14 ENCOUNTER — Encounter (HOSPITAL_COMMUNITY): Payer: Self-pay | Admitting: Clinical

## 2014-11-14 DIAGNOSIS — F200 Paranoid schizophrenia: Secondary | ICD-10-CM | POA: Diagnosis not present

## 2014-11-14 DIAGNOSIS — F4310 Post-traumatic stress disorder, unspecified: Secondary | ICD-10-CM
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
## 2014-11-30 ENCOUNTER — Other Ambulatory Visit (HOSPITAL_COMMUNITY): Payer: Self-pay | Admitting: Psychiatry

## 2014-11-30 DIAGNOSIS — F200 Paranoid schizophrenia: Secondary | ICD-10-CM

## 2014-12-01 ENCOUNTER — Ambulatory Visit (HOSPITAL_COMMUNITY): Payer: Self-pay | Admitting: Psychiatry

## 2014-12-02 NOTE — Telephone Encounter (Signed)
Met with Dr. Adele Schilder who authorized a one time refill of patient's prescribed Cogentin and a new one time order [facility]scribed [facility] [place].

## 2014-12-13 ENCOUNTER — Encounter (HOSPITAL_COMMUNITY): Payer: Self-pay | Admitting: Clinical

## 2014-12-13 ENCOUNTER — Ambulatory Visit (INDEPENDENT_AMBULATORY_CARE_PROVIDER_SITE_OTHER): Payer: BLUE CROSS/BLUE SHIELD | Admitting: Clinical

## 2014-12-13 DIAGNOSIS — F4310 Post-traumatic stress disorder, unspecified: Secondary | ICD-10-CM

## 2014-12-13 DIAGNOSIS — F200 Paranoid schizophrenia: Secondary | ICD-10-CM | POA: Diagnosis not present

## 2014-12-13 NOTE — Progress Notes (Signed)
   THERAPIST PROGRESS NOTE  Session Time: 8:02 -8:58  Participation Level: Active  Behavioral Response: DisheveledLethargicDepressed  Type of Therapy: Individual Therapy  Treatment Goals addressed: Improve Psychiatric Symptoms, Emotional Regulation Skills, Calming Skills,   Interventions: Motivational Interviewing, Cognitive Behavior Therapy and Grounding/Mindfulness Skills  Summary: Danielle [name] is a 44 y.o. female who presents with Schizophrenia and PTSD.   Suicidal/Homicidal: No - without intent/plan  Therapist Response:  Danielle [name] met with clinician for an individual session.Danielle [name]'s husband, Danielle [name] joined the session for support. [name] discussed her psychiatric symptoms, her current life events and her homework. [name] had "blanked out" when coming to the building and passed out and collapsed. [name] and her husband shared that a nurse had helped them and had retrieved a wheelchair for Danielle [name].  Danielle [name] agreed to practice her techniques at home. Danielle [name] shared that her husband changed job shifts and everyone is trying to adjust to the new schedule.   Danielle [name] shared that she and her husband had gone to an amusement park for their anniversary. They shared that it was nice but not without some issues. Danielle [name] shared that she likes the house that she had put some but not all her pictures up. She then asked clinician if she could talk about the past and her trauma. Danielle [name] and clinician reviewed how to do that. Client and clinician agreed to begin by completing a grounding technique and then to talk some and then to ground and to do mindfulness techniques to help her calm and regulate her emotions. [name] and clinician practiced a grounding technique together. [name] took a long time to complete the grounding technique. Danielle [name] looked confused after. Clinician asked if Danielle [name] still felt like talking about the trauma or did she want to wait for another time. Danielle [name] shared that she would prefer to wait. Danielle [name] and  clinician discussed the fact that Danielle [name] was in charge. Danielle [name] and clinician did a very brief grounding technique. Danielle [name] agreed to practice the grounding and mindfulness techniques until next session.      Plan: Return again in 2 weeks.  Diagnosis:     Axis I: Schizophrenia and PTSD    Burnette Valenti A, LCSW 12/13/2014

## 2014-12-16 ENCOUNTER — Other Ambulatory Visit (HOSPITAL_COMMUNITY): Payer: Self-pay | Admitting: Psychiatry

## 2014-12-16 DIAGNOSIS — F200 Paranoid schizophrenia: Secondary | ICD-10-CM

## 2014-12-19 ENCOUNTER — Telehealth (HOSPITAL_COMMUNITY): Payer: Self-pay

## 2014-12-19 NOTE — Telephone Encounter (Signed)
Met with Dr. Tadepalli who authorized a one time refill of patient's Depakote as Dr. Arfeen is out this date.  [facility] [place]  Patient to see Dr. Arfeen on 12/28/14. 

## 2014-12-19 NOTE — Telephone Encounter (Signed)
Medication refill - left message patient's Depakote [facility] [place] this date after message was received with request.

## 2014-12-27 ENCOUNTER — Telehealth (HOSPITAL_COMMUNITY): Payer: Self-pay

## 2014-12-27 ENCOUNTER — Ambulatory Visit (HOSPITAL_COMMUNITY): Payer: Self-pay | Admitting: Clinical

## 2014-12-28 ENCOUNTER — Ambulatory Visit (HOSPITAL_COMMUNITY): Payer: Self-pay | Admitting: Psychiatry

## 2014-12-29 ENCOUNTER — Telehealth (HOSPITAL_COMMUNITY): Payer: Self-pay

## 2014-12-29 ENCOUNTER — Other Ambulatory Visit (HOSPITAL_COMMUNITY): Payer: Self-pay | Admitting: Psychiatry

## 2014-12-29 DIAGNOSIS — F200 Paranoid schizophrenia: Secondary | ICD-10-CM

## 2014-12-29 MED ORDER — HALOPERIDOL 5 MG PO TABS: ORAL_TABLET | ORAL | Status: DC

## 2014-12-29 MED ORDER — BENZTROPINE MESYLATE 0.5 MG PO TABS: ORAL_TABLET | ORAL | Status: DC

## 2014-12-29 MED ORDER — TRAZODONE HCL 100 MG PO TABS: 100.0000 mg | ORAL_TABLET | Freq: Every day | ORAL | Status: DC

## 2014-12-29 NOTE — Telephone Encounter (Signed)
Medication management - Called [name] back to inform patient's 90 day order for Haldol medication was [facility] [place].  [name] had left a message earlier today questioning if new order had been sent and stated at next visit he would like to change the pharmacy they use.  Reported they had figured out where the prescription was and had picked it up.

## 2015-01-02 ENCOUNTER — Ambulatory Visit (INDEPENDENT_AMBULATORY_CARE_PROVIDER_SITE_OTHER): Payer: BLUE CROSS/BLUE SHIELD | Admitting: Psychiatry

## 2015-01-02 ENCOUNTER — Encounter (HOSPITAL_COMMUNITY): Payer: Self-pay | Admitting: Psychiatry

## 2015-01-02 VITALS — BP 101/76 | HR 72 | Ht 71.0 in | Wt 194.0 lb

## 2015-01-02 DIAGNOSIS — F4310 Post-traumatic stress disorder, unspecified: Secondary | ICD-10-CM

## 2015-01-02 DIAGNOSIS — F200 Paranoid schizophrenia: Secondary | ICD-10-CM | POA: Diagnosis not present

## 2015-01-02 MED ORDER — HALOPERIDOL 5 MG PO TABS: ORAL_TABLET | ORAL | Status: DC

## 2015-01-02 MED ORDER — DIVALPROEX SODIUM ER 500 MG PO TB24: ORAL_TABLET | ORAL | Status: DC

## 2015-01-02 MED ORDER — BENZTROPINE MESYLATE 0.5 MG PO TABS: ORAL_TABLET | ORAL | Status: DC

## 2015-01-02 MED ORDER — TRAZODONE HCL 100 MG PO TABS: 100.0000 mg | ORAL_TABLET | Freq: Every day | ORAL | Status: DC
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
## 2015-01-03 ENCOUNTER — Emergency Department (HOSPITAL_COMMUNITY)
Admission: EM | Admit: 2015-01-03 | Discharge: 2015-01-04 | Disposition: A | Discharge status: Other Acute Inpatient Hospital | Payer: BLUE CROSS/BLUE SHIELD | Attending: Emergency Medicine | Admitting: Emergency Medicine

## 2015-01-03 ENCOUNTER — Encounter (HOSPITAL_COMMUNITY): Payer: Self-pay | Admitting: Emergency Medicine

## 2015-01-03 DIAGNOSIS — F209 Schizophrenia, unspecified: Secondary | ICD-10-CM

## 2015-01-03 DIAGNOSIS — F419 Anxiety disorder, unspecified: Secondary | ICD-10-CM | POA: Diagnosis not present

## 2015-01-03 DIAGNOSIS — Z7982 Long term (current) use of aspirin: Secondary | ICD-10-CM | POA: Insufficient documentation

## 2015-01-03 DIAGNOSIS — Z79899 Other long term (current) drug therapy: Secondary | ICD-10-CM | POA: Insufficient documentation

## 2015-01-03 DIAGNOSIS — Z87891 Personal history of nicotine dependence: Secondary | ICD-10-CM | POA: Diagnosis not present

## 2015-01-03 DIAGNOSIS — R011 Cardiac murmur, unspecified: Secondary | ICD-10-CM | POA: Insufficient documentation

## 2015-01-03 DIAGNOSIS — F319 Bipolar disorder, unspecified: Secondary | ICD-10-CM | POA: Diagnosis not present

## 2015-01-03 DIAGNOSIS — F200 Paranoid schizophrenia: Secondary | ICD-10-CM | POA: Diagnosis not present

## 2015-01-03 DIAGNOSIS — Z862 Personal history of diseases of the blood and blood-forming organs and certain disorders involving the immune mechanism: Secondary | ICD-10-CM | POA: Diagnosis not present

## 2015-01-03 DIAGNOSIS — F333 Major depressive disorder, recurrent, severe with psychotic symptoms: Secondary | ICD-10-CM | POA: Diagnosis not present

## 2015-01-03 DIAGNOSIS — Z3202 Encounter for pregnancy test, result negative: Secondary | ICD-10-CM | POA: Insufficient documentation

## 2015-01-03 DIAGNOSIS — E039 Hypothyroidism, unspecified: Secondary | ICD-10-CM | POA: Diagnosis not present

## 2015-01-03 DIAGNOSIS — R45851 Suicidal ideations: Secondary | ICD-10-CM | POA: Diagnosis present

## 2015-01-03 LAB — CBC
HEMATOCRIT: 34.3 % — AB (ref 36.0–46.0)
HEMOGLOBIN: 11.5 g/dL — AB (ref 12.0–15.0)
MCH: 31.8 pg (ref 26.0–34.0)
MCHC: 33.5 g/dL (ref 30.0–36.0)
MCV: 94.8 fL (ref 78.0–100.0)
Platelets: 246 10*3/uL (ref 150–400)
RBC: 3.62 MIL/uL — ABNORMAL LOW (ref 3.87–5.11)
RDW: 13 % (ref 11.5–15.5)
WBC: 5.7 10*3/uL (ref 4.0–10.5)

## 2015-01-03 LAB — COMPREHENSIVE METABOLIC PANEL
ALBUMIN: 4.1 g/dL (ref 3.5–5.0)
ALT: 9 U/L — ABNORMAL LOW (ref 14–54)
ANION GAP: 9 (ref 5–15)
AST: 16 U/L (ref 15–41)
Alkaline Phosphatase: 46 U/L (ref 38–126)
BILIRUBIN TOTAL: 0.3 mg/dL (ref 0.3–1.2)
BUN: 15 mg/dL (ref 6–20)
CHLORIDE: 111 mmol/L (ref 101–111)
CO2: 17 mmol/L — ABNORMAL LOW (ref 22–32)
Calcium: 9.1 mg/dL (ref 8.9–10.3)
Creatinine, Ser: 0.94 mg/dL (ref 0.44–1.00)
GFR calc Af Amer: >60 mL/min (ref >60)
GLUCOSE: 98 mg/dL (ref 65–99)
POTASSIUM: 3.7 mmol/L (ref 3.5–5.1)
Sodium: 137 mmol/L (ref 135–145)
TOTAL PROTEIN: 7.2 g/dL (ref 6.5–8.1)

## 2015-01-03 LAB — URINALYSIS, ROUTINE W REFLEX MICROSCOPIC
BILIRUBIN URINE: NEGATIVE
Glucose, UA: NEGATIVE mg/dL
KETONES UR: NEGATIVE mg/dL
NITRITE: NEGATIVE
Protein, ur: NEGATIVE mg/dL
Specific Gravity, Urine: 1.018 (ref 1.005–1.030)
UROBILINOGEN UA: 1 mg/dL (ref 0.0–1.0)
pH: 6.5 (ref 5.0–8.0)

## 2015-01-03 LAB — RAPID URINE DRUG SCREEN, HOSP PERFORMED
Amphetamines: NOT DETECTED
BARBITURATES: NOT DETECTED
Benzodiazepines: NOT DETECTED
Cocaine: NOT DETECTED
OPIATES: NOT DETECTED
TETRAHYDROCANNABINOL: NOT DETECTED

## 2015-01-03 LAB — PREGNANCY, URINE: PREG TEST UR: NEGATIVE

## 2015-01-03 LAB — VALPROIC ACID LEVEL: VALPROIC ACID LVL: 34 ug/mL — AB (ref 50.0–100.0)

## 2015-01-03 LAB — URINE MICROSCOPIC-ADD ON

## 2015-01-03 LAB — SALICYLATE LEVEL: Salicylate Lvl: <4 mg/dL (ref 2.8–30.0)

## 2015-01-03 LAB — ETHANOL

## 2015-01-03 LAB — ACETAMINOPHEN LEVEL: Acetaminophen (Tylenol), Serum: <10 ug/mL — ABNORMAL LOW (ref 10–30)

## 2015-01-03 MED ORDER — HALOPERIDOL 5 MG PO TABS: 5.0000 mg | ORAL_TABLET | Freq: Two times a day (BID) | ORAL | Status: DC

## 2015-01-03 MED ORDER — LEVOTHYROXINE SODIUM 75 MCG PO TABS
75.0000 ug | ORAL_TABLET | Freq: Every day | ORAL | Status: DC
  Administered 2015-01-04: 75 ug via ORAL
  Filled 2015-01-03 (×2): qty 1

## 2015-01-03 MED ORDER — HALOPERIDOL 5 MG PO TABS: 10.0000 mg | ORAL_TABLET | Freq: Every day | ORAL | Status: DC

## 2015-01-03 MED ORDER — BENZTROPINE MESYLATE 1 MG PO TABS
0.5000 mg | ORAL_TABLET | Freq: Two times a day (BID) | ORAL | Status: DC
  Administered 2015-01-03 – 2015-01-04 (×2): 0.5 mg via ORAL
  Filled 2015-01-03 (×2): qty 1

## 2015-01-03 MED ORDER — HALOPERIDOL 5 MG PO TABS
10.0000 mg | ORAL_TABLET | Freq: Once | ORAL | Status: AC
  Administered 2015-01-03: 10 mg via ORAL
  Filled 2015-01-03 (×2): qty 2

## 2015-01-03 MED ORDER — DIVALPROEX SODIUM ER 500 MG PO TB24
500.0000 mg | ORAL_TABLET | Freq: Two times a day (BID) | ORAL | Status: DC
  Administered 2015-01-03 – 2015-01-04 (×2): 500 mg via ORAL
  Filled 2015-01-03 (×3): qty 1

## 2015-01-03 MED ORDER — HALOPERIDOL 5 MG PO TABS: 5.0000 mg | ORAL_TABLET | Freq: Every day | ORAL | Status: DC

## 2015-01-03 MED ORDER — LORAZEPAM 1 MG PO TABS
1.0000 mg | ORAL_TABLET | Freq: Once | ORAL | Status: AC
  Administered 2015-01-03: 1 mg via ORAL
  Filled 2015-01-03: qty 1

## 2015-01-03 MED ORDER — HALOPERIDOL 5 MG PO TABS
5.0000 mg | ORAL_TABLET | Freq: Every day | ORAL | Status: DC
  Administered 2015-01-04: 5 mg via ORAL
  Filled 2015-01-03: qty 1

## 2015-01-03 MED ORDER — TRAZODONE HCL 100 MG PO TABS
100.0000 mg | ORAL_TABLET | Freq: Every day | ORAL | Status: DC
  Administered 2015-01-03: 100 mg via ORAL
  Filled 2015-01-03: qty 1

## 2015-01-03 MED ORDER — TOPIRAMATE 100 MG PO TABS
100.0000 mg | ORAL_TABLET | Freq: Two times a day (BID) | ORAL | Status: DC
  Administered 2015-01-03 – 2015-01-04 (×2): 100 mg via ORAL
  Filled 2015-01-03 (×2): qty 1

## 2015-01-03 MED ORDER — ASPIRIN EC 81 MG PO TBEC
81.0000 mg | DELAYED_RELEASE_TABLET | Freq: Every day | ORAL | Status: DC
  Administered 2015-01-04: 81 mg via ORAL
  Filled 2015-01-03 (×2): qty 1
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
## 2015-01-03 NOTE — ED Notes (Signed)
Pt's belongings going with her female visitor whom she resides with.

## 2015-01-03 NOTE — BH Assessment (Signed)
Assessment Note  Danielle [name] is an 44 y.o. female with history of depression, anxiety, PTSD, Schizophrenia, and Bipolar I Disorder. Per ED notes, "Pt's family states that pt was out of her medications due to miscommunication between them and her psychiatrist. Pt has been having delusions and SI. Pt was seen last Monday by her doctor and pt has symptoms then. Pt's family states that she started back on her meds on Thursday. Her doctor told them that they needed to give the symptoms a few more days to get the medication back in her system."   Patient reports suicidal thoughts onset today. She has a plan to cut herself with a knife. She has access to means. She has tried to harm herself 3x's in the past (cut self, jump off building, drown self). She is unable to contract for safety today. She has a hx of self mutilating behaviors (cutting). She denies HI. Patient has auditory hallucinations of voices, "They tell me I'm stupid and worthless". She denies alcohol and drug use.   Patient's outpatient psychiatrist is Dr. Lolly Mustache. Per Dr. Lolly Mustache, patient to remain in the ED overnight. Pt to be re-evaluated in the morning by psychiatry. Dr. Lolly Mustache has requested patient to remain on all medications documented in his consult note 01/03/2015. Dr. Lolly Mustache does not want any of patient's psych medications changed and would like her to continue medication regimen while she is in the ED. Patient's nurse-Rochell and EDP-Dr. Jeanella Anton made aware.    Axis I:  Depression, Anxiety, PTSD, Schizophrenia, and Bipolar I Disorder. Axis II: Deferred Axis III:  Past Medical History  Diagnosis Date  . Depressed   . Seizures   . Heart murmur   . Hypothyroidism   . Anemia   . Headache(784.0)   . Anxiety   . PTSD (post-traumatic stress disorder)   . Shortness of breath   . Schizophrenia   . Bipolar 1 disorder    Axis IV: other psychosocial or environmental problems, problems related to social environment, problems with  access to health care services and problems with primary support group Axis V: 31-40 impairment in reality testing  Past Medical History:  Past Medical History  Diagnosis Date  . Depressed   . Seizures   . Heart murmur   . Hypothyroidism   . Anemia   . Headache(784.0)   . Anxiety   . PTSD (post-traumatic stress disorder)   . Shortness of breath   . Schizophrenia   . Bipolar 1 disorder     Past Surgical History  Procedure Laterality Date  . Tubal ligation  1996  . Foot surgery      Family History:  Family History  Problem Relation Age of Onset  . Adopted: Yes    Social History:  reports that she quit smoking about 3 years ago. Her smoking use included Cigarettes. She has a 45 pack-year smoking history. She has never used smokeless tobacco. She reports that she does not drink alcohol or use illicit drugs.  Additional Social History:  Alcohol / Drug Use Pain Medications: SEE MAR Prescriptions: SEE MAR Over the Counter: SEE MAR History of alcohol / drug use?: No history of alcohol / drug abuse  CIWA: CIWA-Ar BP: 100/72 mmHg Pulse Rate: 69 COWS:    Allergies:  Allergies  Allergen Reactions  . Tomato Rash    Pt reports that she breaks out in a rash if she eats tomato based foods but can eat a whole tomato  . Imitrex [Sumatriptan Base] Hives  .  Mustard Seed Rash    Also allergic to [facility] Medications:  (Not in a hospital admission)  OB/GYN Status:  No LMP recorded. Patient is postmenopausal.  General Assessment Data Location of Assessment: [facility] TTS Assessment: In system Is this a Tele or Face-to-Face Assessment?: Face-to-Face Is this an Initial Assessment or a Re-assessment for this encounter?: Initial Assessment Marital status: Married [place] name:  (unk) Is patient pregnant?: No Pregnancy Status: No Living Arrangements: Spouse/significant other Can pt return to current living arrangement?: Yes Admission Status: Voluntary Is patient capable of  signing voluntary admission?: Yes Referral Source: Self/Family/Friend Insurance type:  Herbalist)  Medical Screening Exam [facility] Walk-[facility] ONLY) Medical Exam completed: No  Crisis Care Plan Living Arrangements: Spouse/significant other Name of Psychiatrist:  (No psychiatrist ) Name of Therapist:  (n/a)  Education Status Is patient currently in school?: No Current Grade:  (n/a) Highest grade of school patient has completed:  (n/a) Name of school:  (n/a) Contact person:  (n/a)  Risk to self with the past 6 months Suicidal Ideation: Yes-Currently Present Has patient been a risk to self within the past 6 months prior to admission? : Yes Suicidal Intent: Yes-Currently Present Has patient had any suicidal intent within the past 6 months prior to admission? : Yes Is patient at risk for suicide?: Yes Suicidal Plan?: Yes-Currently Present Has patient had any suicidal plan within the past 6 months prior to admission? : Yes Specify Current Suicidal Plan:  (n/a) Access to Means: Yes Specify Access to Suicidal Means:  (knife) What has been your use of drugs/alcohol within the last 12 months?:  (patient denies) Previous Attempts/Gestures: Yes How many times?:  (3x's) Other Self Harm Risks:  (patient denies ) Triggers for Past Attempts: Other (Comment) (depression) Intentional Self Injurious Behavior: None Family Suicide History: Unknown Recent stressful life event(s): Other (Comment) ("My medications were mixed up and I didn't have them x1 week) Persecutory voices/beliefs?: No Depression: Yes Depression Symptoms: Feeling angry/irritable, Feeling worthless/self pity, Loss of interest in usual pleasures, Guilt, Tearfulness, Fatigue, Isolating, Insomnia, Despondent Substance abuse history and/or treatment for substance abuse?: No Suicide prevention information given to non-admitted patients: Not applicable  Risk to Others within the past 6 months Homicidal Ideation: No Does patient have any  lifetime risk of violence toward others beyond the six months prior to admission? : No Thoughts of Harm to Others: No Current Homicidal Intent: No Current Homicidal Plan: No Access to Homicidal Means: No Identified Victim:  (n/a) History of harm to others?: No Assessment of Violence: None Noted Violent Behavior Description:  (patient calm and cooperative ) Does patient have access to weapons?: Yes (Comment) Criminal Charges Pending?: No Does patient have a court date: No Is patient on probation?: No  Psychosis Hallucinations: Auditory ("Voices tell me I'm stupid and to hurt myself") Delusions: None noted  Mental Status Report Appearance/Hygiene: Disheveled Eye Contact: Good Motor Activity: Freedom of movement Speech: Logical/coherent Level of Consciousness: Alert Mood: Depressed Affect: Appropriate to circumstance Anxiety Level: None Thought Processes: Coherent, Relevant Judgement: Impaired Orientation: Person, Place, Time, Situation Obsessive Compulsive Thoughts/Behaviors: None  Cognitive Functioning Concentration: Decreased Memory: Recent Intact, Remote Intact IQ: Average Insight: Fair Impulse Control: Good Appetite: Fair Weight Loss:  (none reported) Weight Gain:  (none reported) Sleep: Decreased Total Hours of Sleep:  (varies ) Vegetative Symptoms: None  ADLScreening [facility]) Patient's cognitive ability adequate to safely complete daily activities?: Yes Patient able to express need for assistance with ADLs?: Yes Independently performs  ADLs?: Yes (appropriate for developmental age)  Prior Inpatient Therapy Prior Inpatient Therapy: Yes Prior Therapy Dates:  (patient unable to recall dates) Prior Therapy Facilty/Provider(s):  ([facility], [facility], and [facility]) Reason for Treatment:  (suicidal thoughts, depression, med managment, anxiety)  Prior Outpatient Therapy Prior Outpatient Therapy: Yes Prior Therapy Dates:  (current) Prior Therapy  Facilty/Provider(s):  ([place] outpatient ) Reason for Treatment:  (med managment ) Does patient have an ACCT team?: No Does patient have Intensive In-House Services?  : No Does patient have [facility] services? : No Does patient have P4CC services?: No  ADL Screening (condition at time of admission) Patient's cognitive ability adequate to safely complete daily activities?: Yes Is the patient deaf or have difficulty hearing?: No Does the patient have difficulty seeing, even when wearing glasses/contacts?: No Does the patient have difficulty concentrating, remembering, or making decisions?: Yes Patient able to express need for assistance with ADLs?: Yes Does the patient have difficulty dressing or bathing?: No Independently performs ADLs?: Yes (appropriate for developmental age) Communication: Independent Dressing (OT): Independent Grooming: Independent Feeding: Independent Bathing: Independent Toileting: Independent In/Out Bed: Independent Walks in Home: Independent Does the patient have difficulty walking or climbing stairs?: No Weakness of Legs: None Weakness of Arms/Hands: None  Home Assistive Devices/Equipment Home Assistive Devices/Equipment: None    Abuse/Neglect Assessment (Assessment to be complete while patient is alone) Physical Abuse: Denies Verbal Abuse: Denies Sexual Abuse: Denies Exploitation of patient/patient's resources: Denies Self-Neglect: Denies Values / Beliefs Cultural Requests During Hospitalization: None Spiritual Requests During Hospitalization: None   Advance Directives (For Healthcare) Does patient have an advance directive?: No Would patient like information on creating an advanced directive?: No - patient declined information    Additional Information 1:1 In Past 12 Months?: No CIRT Risk: No Elopement Risk: No Does patient have medical clearance?: No     Disposition:  Per Dr. Lolly Mustache, patient to remain in the ED overnight. Pt to be  re-evaluated in the morning by psychiatry.  *Dr. Lolly Mustache has requested patient to remain on all medications documented in his consult note 01/03/2015. Dr. Lolly Mustache does not want any of patient's psych medications changed and would like her to continue medication regimen while she is in the ED. Patient's nurse and EDP-Dr. Jeanella Anton made aware.      On Site Evaluation by:   Reviewed with Physician:    Melynda Ripple [facility] 01/03/2015 7:22 PM

## 2015-01-03 NOTE — BH Assessment (Signed)
Per Dr. Lolly Mustache, patient to remain in the ED overnight. Pt to be re-evaluated in the morning by psychiatry.  *Dr. Lolly Mustache has requested patient to remain on all medications documented in his consult note 01/03/2015. Dr. Lolly Mustache does not want any of patient's psych medications changed and would like her to continue medication regimen while she is in the ED. Dr. Lolly Mustache stated that changes to patient's psych medications may result in medical issues. Dr. Lolly Mustache would like patient observed overnight while she is taking these medications. Patient's nurse and EDP-Dr. Jeanella Anton made aware of Dr. Sheela Stack request and concerns.

## 2015-01-03 NOTE — ED Notes (Addendum)
Patient's contacts: [name] [phone]                                  Camelia Eng    [phone]

## 2015-01-03 NOTE — ED Notes (Signed)
Patient states she is having SI to "cut both my wrists."  She states she ran out of her medication and has decompensated.  Patient appears sedated.  She is cooperative.  She is a high fall risk due to hx of falls in prior hospitalizations.

## 2015-01-03 NOTE — ED Provider Notes (Signed)
CSN: [id number]     Arrival date & time 01/03/15  1637 History   First MD Initiated Contact with Patient 01/03/15 1711     Chief Complaint  Patient presents with  . Suicidal     The history is provided by the patient. No language interpreter was used.   Danielle [name] presents for evaluation of SI and hallucinations.  She has a hx/o schizophrenia and BPd/o.  She ran out of her meds last Saturday and for the last week she has experienced ongoing auditory hallucinations with voices that are telling her she is a bad person.  She also sees shadows at times.  She endorses SI and today she tried to get a knife.  She saw her psychiatrist five days ago and restarted her haldol and is taking it as prescribed but has persistent hallucinations.  She was recently diagnosed with a UTI 2 weeks ago and started on bactrim and is continuing her medications as prescribed.  No reports of fevers, vomiting, abdominal pain.    Past Medical History  Diagnosis Date  . Depressed   . Seizures   . Heart murmur   . Hypothyroidism   . Anemia   . Headache(784.0)   . Anxiety   . PTSD (post-traumatic stress disorder)   . Shortness of breath   . Schizophrenia   . Bipolar 1 disorder    Past Surgical History  Procedure Laterality Date  . Tubal ligation  1996  . Foot surgery     Family History  Problem Relation Age of Onset  . Adopted: Yes   Social History  Substance Use Topics  . Smoking status: Former Smoker -- 1.50 packs/day for 30 years    Types: Cigarettes    Quit date: 09/20/2011  . Smokeless tobacco: Never Used  . Alcohol Use: No   OB History    No data available     Review of Systems  All other systems reviewed and are negative.     Allergies  Tomato; Imitrex; and Mustard seed  Home Medications   Prior to Admission medications   Medication Sig Start Date End Date Taking? Authorizing Provider  aspirin EC 81 MG tablet Take 81 mg by mouth daily with breakfast.   Yes Historical Provider, MD   benztropine (COGENTIN) 0.5 MG tablet TAKE 1 TABLET (0.5 MG TOTAL) BY MOUTH 2 (TWO) TIMES DAILY. Patient taking differently: Take 0.5 mg by mouth 2 (two) times daily.  01/02/15  Yes Cleotis Nipper, MD  divalproex (DEPAKOTE ER) 500 MG 24 hr tablet TAKE 1 TABLET (500 MG TOTAL) BY MOUTH 2 (TWO) TIMES DAILY. Patient taking differently: Take 500 mg by mouth 2 (two) times daily.  01/02/15  Yes Cleotis Nipper, MD  haloperidol (HALDOL) 5 MG tablet TAKE 1 TAB AM AND 2 AT BEDTIME. Patient taking differently: Take 5-10 mg by mouth 2 (two) times daily. TAKE 1 TAB AM AND 2 AT BEDTIME. 01/02/15  Yes Cleotis Nipper, MD  levothyroxine (SYNTHROID, LEVOTHROID) 75 MCG tablet Take 75 mcg by mouth daily before breakfast.  08/09/14  Yes Historical Provider, MD  sulfamethoxazole-trimethoprim (BACTRIM DS,SEPTRA DS) 800-160 MG per tablet Take 1 tablet by mouth 2 (two) times daily.   Yes Historical Provider, MD  topiramate (TOPAMAX) 100 MG tablet Take 100 mg by mouth 2 (two) times daily.   Yes Historical Provider, MD  traZODone (DESYREL) 100 MG tablet Take 1 tablet (100 mg total) by mouth at bedtime. 01/02/15  Yes Cleotis Nipper, MD  BP 100/72 mmHg  Pulse 69  Temp(Src) 98.2 F (36.8 C) (Oral)  Resp 17  SpO2 98% Physical Exam  Constitutional: She is oriented to person, place, and time. She appears well-developed and well-nourished.  HENT:  Head: Normocephalic and atraumatic.  Cardiovascular: Normal rate and regular rhythm.   Murmur heard. SEM  Pulmonary/Chest: Effort normal and breath sounds normal. No respiratory distress.  Abdominal: Soft. There is no tenderness. There is no rebound and no guarding.  Musculoskeletal: She exhibits no edema or tenderness.  Neurological: She is alert and oriented to person, place, and time.  Skin: Skin is warm and dry.  Psychiatric:  Paranoid and anxious, appears to be hallucinating on exam.    Nursing note and vitals reviewed.   ED Course  Procedures (including critical care  time) Labs Review Labs Reviewed  COMPREHENSIVE METABOLIC PANEL - Abnormal; Notable for the following:    CO2 17 (*)    ALT 9 (*)    All other components within normal limits  ACETAMINOPHEN LEVEL - Abnormal; Notable for the following:    Acetaminophen (Tylenol), Serum <10 (*)    All other components within normal limits  CBC - Abnormal; Notable for the following:    RBC 3.62 (*)    Hemoglobin 11.5 (*)    HCT 34.3 (*)    All other components within normal limits  VALPROIC ACID LEVEL - Abnormal; Notable for the following:    Valproic Acid Lvl 34 (*)    All other components within normal limits  ETHANOL  SALICYLATE LEVEL  URINE RAPID DRUG SCREEN, HOSP PERFORMED  URINALYSIS, ROUTINE W REFLEX MICROSCOPIC (NOT AT [facility])  PREGNANCY, URINE    Imaging Review No results found. I have personally reviewed and evaluated these images and lab results as part of my medical decision-making.   EKG Interpretation None      MDM   Final diagnoses:  Schizophrenia, unspecified type    Patient with history of schizophrenia and bipolar disorder here for auditory hallucinations and SI after being out of her medications for several days. She is currently on her medications again in due for her evening doses. She is hallucinating on evaluation in the emergency department and appears fearful when these hallucinations occur. She is medically cleared for psychiatric evaluation.  Tilden Fossa, MD 01/03/15 [phone]

## 2015-01-03 NOTE — ED Notes (Signed)
Pt's family states that pt was out of her medications due to miscommunication between them and her PCP.  Pt has been having delusions and SI.  Pt was seen last Monday by her doctor and pt has symptoms then.  Pt's family states that she started back on her meds on Thursday.Her doctor told them that they needed to give the symptoms a few more days to get the medication back in her system.  Pt today got a knife so family decided they better bring her in for eval.

## 2015-01-04 DIAGNOSIS — F200 Paranoid schizophrenia: Secondary | ICD-10-CM

## 2015-01-04 DIAGNOSIS — F333 Major depressive disorder, recurrent, severe with psychotic symptoms: Secondary | ICD-10-CM

## 2015-01-04 MED ORDER — SULFAMETHOXAZOLE-TRIMETHOPRIM 800-160 MG PO TABS
1.0000 | ORAL_TABLET | Freq: Two times a day (BID) | ORAL | Status: DC
  Administered 2015-01-04: 1 via ORAL
  Filled 2015-01-04: qty 1

## 2015-01-04 MED ORDER — IBUPROFEN 200 MG PO TABS
600.0000 mg | ORAL_TABLET | Freq: Four times a day (QID) | ORAL | Status: DC | PRN
  Administered 2015-01-04: 600 mg via ORAL
  Filled 2015-01-04: qty 3

## 2015-01-04 NOTE — ED Notes (Signed)
Pt is scheduled for d/c to [facility] as per MD's order. Report called to Chelsea Aus, RN and [facility]. [facility] [employer] notified of pt's d/c status. Pt is aware and in agreement with d/c order.  Pt was lowered to floor earlier this shift at approximately 1825 by her husband and GDP related to pseudoseizure. No injuries noted. Vitals done and WNL. Pt was alert and oriented during this episode. Support, encouragement and availability offered.  Safety maintained on Q 15 minutes checks as ordered. Pt lying in bed awake at present. No distress to note at this time.

## 2015-01-04 NOTE — BH Assessment (Signed)
Patient accepted to [facility]), per Alecia Lemming. Patient accepted by psychiatrist-Dr. Estill Cotta. Nursing report # [phone]. [facility] will provide transport to the facility.

## 2015-01-04 NOTE — ED Notes (Signed)
Pt transported to [facility] by [facility] transportation service for continuation of specialized care.Patient has no belongings. Pt left in no acute distress. 

## 2015-01-04 NOTE — Progress Notes (Signed)
Per psychiatrist, patient recommended for inpatient treatment.   Olga Coaster, LCSW  Clinical Social Work  [employer] [phone]

## 2015-01-04 NOTE — Progress Notes (Signed)
Pt visible in bed majority of this morning. Pt oriented to self, place and time. Pt denied HI, endorsed SI without a plan; verbally contracted for safety. Pt reported AVH when assessed, told writer "I see shadows and the voices are telling me that I'm not good enough, I don't deserve to live".  Pt OOB to bathroom and back to bed, gait steady, no fall event to note at this time. Pt compliant with scheduled medications as ordered. Support, availability and encouragement provided to this pt. Safety maintained on Q 15 minutes checks as ordered without gestures or incident of self injurious behavior to note thus far this shift.

## 2015-01-04 NOTE — ED Notes (Signed)
Patient returned to department by [facility] transport team. Thayer Ohm ( [facility] staff) reported that he took wrong patient out of department that he was suppose to pick up a patient going to [facility]. Patient returned to treatment area 28 and informed of delay of transport. [facility] transport team called and informed that they would be here in 15 min to transport patient to [facility]

## 2015-01-04 NOTE — BH Assessment (Signed)
[facility] Assessment Progress Note  The following facilities have been contacted to seek placement for this pt, with results as noted:  Beds available, information sent, decision pending:  [facility]  No beds available, but accepting referrals for future consideration:  [facility]  At capacity:  [facility] Alanda Slim, [place] Triage Specialist [phone]

## 2015-01-04 NOTE — Consult Note (Signed)
[facility] Face-to-Face Psychiatry Consult   Reason for Consult:  MDD, Recurrent, severe with Psychotic symptoms Referring Physician:  EDP Patient Identification: Danielle [name] MRN:  [id number] Principal Diagnosis: Major depressive disorder, recurrent, severe with psychotic features Diagnosis:   Patient Active Problem List   Diagnosis Date Noted  . Major depressive disorder, recurrent, severe with psychotic features [F33.3] 01/04/2015    Priority: High  . Schizophrenia, unspecified type [F20.9]   . Mood disorder [F39] 04/06/2013  . Headache(784.0) [R51] 06/04/2012  . Seizure [R56.9] 06/04/2012  . Hypothyroidism [E03.9] 06/04/2012  . OSA (obstructive sleep apnea) [G47.33] 08/29/2011  . Major depressive disorder, recurrent [F33.9] 07/31/2011  . Post traumatic stress disorder (PTSD) [F43.10] 07/31/2011  . Schizophrenia, paranoid type [F20.0] 07/30/2011    Total Time spent with patient: 1 hour  Subjective:   Danielle [name] is a 44 y.o. female patient admitted with MDD, Recurrent, severe with Psychotic symptoms  HPI:  Caucasian female, 44 years old was evaluated for suicidal ideation, hearing voices and seeing shadows.  She is Dr Adele Schilder Patient and states she is Compliant with her medications.  Patient reports that the voices are calling her dummy and stupid and that she is seeing shadows.   She has been hospitalized several times in the past at other facilities.  She reports poor sleep and fair appetite,.  She also stated that she feels like cutting her wrist as a means of killing her self.   Patient could not remember her last hospitalization but she was at our [facility] December 2014 but sees Dr Adele Schilder at [facility].  Patient cannot contract for safety and has been accepted for admission.  HPI Elements:   Location:  MDD, Recurrent, severe with Psychosis, Schizophrenis, Paranoid type by hx, . Quality:  severe. Severity:  severe. Timing:  acute. Duration:  Chronic mental  illness. Context:  Brought in for evaluataion of exacerbation of depression with Psychosis..  Past Medical History:  Past Medical History  Diagnosis Date  . Depressed   . Seizures   . Heart murmur   . Hypothyroidism   . Anemia   . Headache(784.0)   . Anxiety   . PTSD (post-traumatic stress disorder)   . Shortness of breath   . Schizophrenia   . Bipolar 1 disorder     Past Surgical History  Procedure Laterality Date  . Tubal ligation  1996  . Foot surgery     Family History:  Family History  Problem Relation Age of Onset  . Adopted: Yes   Social History:  History  Alcohol Use No     History  Drug Use No    Social History   Social History  . Marital Status: Married    Spouse Name: N/A  . Number of Children: 2  . Years of Education: N/A   Occupational History  . unemployed    Social History Main Topics  . Smoking status: Former Smoker -- 1.50 packs/day for 30 years    Types: Cigarettes    Quit date: 09/20/2011  . Smokeless tobacco: Never Used  . Alcohol Use: No  . Drug Use: No  . Sexual Activity: Yes    Birth Control/ Protection: Surgical   Other Topics Concern  . None   Social History Narrative   Pt is adopted. Unsure of family history.   Additional Social History:    Pain Medications: SEE MAR Prescriptions: SEE MAR Over the Counter: SEE MAR History of alcohol / drug use?: No history of alcohol /  drug abuse                     Allergies:   Allergies  Allergen Reactions  . Tomato Rash    Pt reports that she breaks out in a rash if she eats tomato based foods but can eat a whole tomato  . Imitrex [Sumatriptan Base] Hives  . Mustard Seed Rash    Also allergic to [employer]:  Results for orders placed or performed during the hospital encounter of 01/03/15 (from the past 48 hour(s))  Comprehensive metabolic panel     Status: Abnormal   Collection Time: 01/03/15  5:21 PM  Result Value Ref Range   Sodium 137 135 - 145 mmol/L    Potassium 3.7 3.5 - 5.1 mmol/L   Chloride 111 101 - 111 mmol/L   CO2 17 (L) 22 - 32 mmol/L   Glucose, Bld 98 65 - 99 mg/dL   BUN 15 6 - 20 mg/dL   Creatinine, Ser 0.94 0.44 - 1.00 mg/dL   Calcium 9.1 8.9 - 10.3 mg/dL   Total Protein 7.2 6.5 - 8.1 g/dL   Albumin 4.1 3.5 - 5.0 g/dL   AST 16 15 - 41 U/L   ALT 9 (L) 14 - 54 U/L   Alkaline Phosphatase 46 38 - 126 U/L   Total Bilirubin 0.3 0.3 - 1.2 mg/dL   GFR calc non Af Amer >60 >60 mL/min   GFR calc Af Amer >60 >60 mL/min    Comment: (NOTE) The eGFR has been calculated using the CKD EPI equation. This calculation has not been validated in all clinical situations. eGFR's persistently <60 mL/min signify possible Chronic Kidney Disease.    Anion gap 9 5 - 15  CBC     Status: Abnormal   Collection Time: 01/03/15  5:21 PM  Result Value Ref Range   WBC 5.7 4.0 - 10.5 K/uL   RBC 3.62 (L) 3.87 - 5.11 MIL/uL   Hemoglobin 11.5 (L) 12.0 - 15.0 g/dL   HCT 34.3 (L) 36.0 - 46.0 %   MCV 94.8 78.0 - 100.0 fL   MCH 31.8 26.0 - 34.0 pg   MCHC 33.5 30.0 - 36.0 g/dL   RDW 13.0 11.5 - 15.5 %   Platelets 246 150 - 400 K/uL  Ethanol (ETOH)     Status: None   Collection Time: 01/03/15  5:22 PM  Result Value Ref Range   Alcohol, Ethyl (B) <5 <5 mg/dL    Comment:        LOWEST DETECTABLE LIMIT FOR SERUM ALCOHOL IS 5 mg/dL FOR MEDICAL PURPOSES ONLY   Salicylate level     Status: None   Collection Time: 01/03/15  5:22 PM  Result Value Ref Range   Salicylate Lvl <0.0 2.8 - 30.0 mg/dL  Acetaminophen level     Status: Abnormal   Collection Time: 01/03/15  5:22 PM  Result Value Ref Range   Acetaminophen (Tylenol), Serum <10 (L) 10 - 30 ug/mL    Comment:        THERAPEUTIC CONCENTRATIONS VARY SIGNIFICANTLY. A RANGE OF 10-30 ug/mL MAY BE AN EFFECTIVE CONCENTRATION FOR MANY PATIENTS. HOWEVER, SOME ARE BEST TREATED AT CONCENTRATIONS OUTSIDE THIS RANGE. ACETAMINOPHEN CONCENTRATIONS >150 ug/mL AT 4 HOURS AFTER INGESTION AND >50 ug/mL AT  12 HOURS AFTER INGESTION ARE OFTEN ASSOCIATED WITH TOXIC REACTIONS.   Valproic acid level     Status: Abnormal   Collection Time: 01/03/15  5:31 PM  Result  Value Ref Range   Valproic Acid Lvl 34 (L) 50.0 - 100.0 ug/mL  Urine rapid drug screen (hosp performed) (Not at [facility])     Status: None   Collection Time: 01/03/15  5:32 PM  Result Value Ref Range   Opiates NONE DETECTED NONE DETECTED   Cocaine NONE DETECTED NONE DETECTED   Benzodiazepines NONE DETECTED NONE DETECTED   Amphetamines NONE DETECTED NONE DETECTED   Tetrahydrocannabinol NONE DETECTED NONE DETECTED   Barbiturates NONE DETECTED NONE DETECTED    Comment:        DRUG SCREEN FOR MEDICAL PURPOSES ONLY.  IF CONFIRMATION IS NEEDED FOR ANY PURPOSE, NOTIFY LAB WITHIN 5 DAYS.        LOWEST DETECTABLE LIMITS FOR URINE DRUG SCREEN Drug Class       Cutoff (ng/mL) Amphetamine      1000 Barbiturate      200 Benzodiazepine   419 Tricyclics       379 Opiates          300 Cocaine          300 THC              50   Urinalysis, Routine w reflex microscopic (not at [facility])     Status: Abnormal   Collection Time: 01/03/15  5:32 PM  Result Value Ref Range   Color, Urine YELLOW YELLOW   APPearance CLOUDY (A) CLEAR   Specific Gravity, Urine 1.018 1.005 - 1.030   pH 6.5 5.0 - 8.0   Glucose, UA NEGATIVE NEGATIVE mg/dL   Hgb urine dipstick SMALL (A) NEGATIVE   Bilirubin Urine NEGATIVE NEGATIVE   Ketones, ur NEGATIVE NEGATIVE mg/dL   Protein, ur NEGATIVE NEGATIVE mg/dL   Urobilinogen, UA 1.0 0.0 - 1.0 mg/dL   Nitrite NEGATIVE NEGATIVE   Leukocytes, UA LARGE (A) NEGATIVE  Pregnancy, urine     Status: None   Collection Time: 01/03/15  5:32 PM  Result Value Ref Range   Preg Test, Ur NEGATIVE NEGATIVE    Comment:        THE SENSITIVITY OF THIS METHODOLOGY IS >20 mIU/mL.   Urine microscopic-add on     Status: Abnormal   Collection Time: 01/03/15  5:32 PM  Result Value Ref Range   Squamous Epithelial / LPF MANY (A) RARE   WBC,  UA 11-20 <3 WBC/hpf   Bacteria, UA FEW (A) RARE    Vitals: Blood pressure 97/51, pulse 64, temperature 97.6 F (36.4 C), temperature source Oral, resp. rate 18, SpO2 100 %.  Risk to Self: Suicidal Ideation: Yes-Currently Present Suicidal Intent: Yes-Currently Present Is patient at risk for suicide?: Yes Suicidal Plan?: Yes-Currently Present Specify Current Suicidal Plan:  (n/a) Access to Means: Yes Specify Access to Suicidal Means:  (knife) What has been your use of drugs/alcohol within the last 12 months?:  (patient denies) How many times?:  (3x's) Other Self Harm Risks:  (patient denies ) Triggers for Past Attempts: Other (Comment) (depression) Intentional Self Injurious Behavior: None Risk to Others: Homicidal Ideation: No Thoughts of Harm to Others: No Current Homicidal Intent: No Current Homicidal Plan: No Access to Homicidal Means: No Identified Victim:  (n/a) History of harm to others?: No Assessment of Violence: None Noted Violent Behavior Description:  (patient calm and cooperative ) Does patient have access to weapons?: Yes (Comment) Criminal Charges Pending?: No Does patient have a court date: No Prior Inpatient Therapy: Prior Inpatient Therapy: Yes Prior Therapy Dates:  (patient unable to recall dates) Prior Therapy  Facilty/Provider(s):  ([place], [place], and [facility]) Reason for Treatment:  (suicidal thoughts, depression, med managment, anxiety) Prior Outpatient Therapy: Prior Outpatient Therapy: Yes Prior Therapy Dates:  (current) Prior Therapy Facilty/Provider(s):  ([facility] ) Reason for Treatment:  (med managment ) Does patient have an ACCT team?: No Does patient have Intensive In-House Services?  : No Does patient have [facility] services? : No Does patient have [facility] services?: No  Current Facility-Administered Medications  Medication Dose Route Frequency Provider Last Rate Last Dose  . aspirin EC tablet 81 mg  81 mg Oral Q breakfast Lurena Nida, NP   81 mg at 01/04/15 0835  . benztropine (COGENTIN) tablet 0.5 mg  0.5 mg Oral BID Quintella Reichert, MD   0.5 mg at 01/04/15 1005  . divalproex (DEPAKOTE ER) 24 hr tablet 500 mg  500 mg Oral BID Quintella Reichert, MD   500 mg at 01/04/15 1005  . haloperidol (HALDOL) tablet 10 mg  10 mg Oral QHS Lurena Nida, NP      . haloperidol (HALDOL) tablet 5 mg  5 mg Oral Daily Lurena Nida, NP   5 mg at 01/04/15 1005  . ibuprofen (ADVIL,MOTRIN) tablet 600 mg  600 mg Oral Q6H PRN Delfin Gant, NP   600 mg at 01/04/15 1006  . levothyroxine (SYNTHROID, LEVOTHROID) tablet 75 mcg  75 mcg Oral QAC breakfast Quintella Reichert, MD   75 mcg at 01/04/15 [phone]  . topiramate (TOPAMAX) tablet 100 mg  100 mg Oral BID Lurena Nida, NP   100 mg at 01/04/15 1005  . traZODone (DESYREL) tablet 100 mg  100 mg Oral QHS Quintella Reichert, MD   100 mg at 01/03/15 2109   Current Outpatient Prescriptions  Medication Sig Dispense Refill  . aspirin EC 81 MG tablet Take 81 mg by mouth daily with breakfast.    . benztropine (COGENTIN) 0.5 MG tablet TAKE 1 TABLET (0.5 MG TOTAL) BY MOUTH 2 (TWO) TIMES DAILY. (Patient taking differently: Take 0.5 mg by mouth 2 (two) times daily. ) 60 tablet 2  . divalproex (DEPAKOTE ER) 500 MG 24 hr tablet TAKE 1 TABLET (500 MG TOTAL) BY MOUTH 2 (TWO) TIMES DAILY. (Patient taking differently: Take 500 mg by mouth 2 (two) times daily. ) 60 tablet 2  . haloperidol (HALDOL) 5 MG tablet TAKE 1 TAB AM AND 2 AT BEDTIME. (Patient taking differently: Take 5-10 mg by mouth 2 (two) times daily. TAKE 1 TAB AM AND 2 AT BEDTIME.) 90 tablet 2  . levothyroxine (SYNTHROID, LEVOTHROID) 75 MCG tablet Take 75 mcg by mouth daily before breakfast.   5  . sulfamethoxazole-trimethoprim (BACTRIM DS,SEPTRA DS) 800-160 MG per tablet Take 1 tablet by mouth 2 (two) times daily.    Marland Kitchen topiramate (TOPAMAX) 100 MG tablet Take 100 mg by mouth 2 (two) times daily.    . traZODone (DESYREL) 100 MG tablet Take 1 tablet (100 mg total)  by mouth at bedtime. 30 tablet 2  . [DISCONTINUED] zolpidem (AMBIEN) 5 MG tablet Take 1 tablet (5 mg total) by mouth at bedtime as needed for sleep. 10 tablet 0    Musculoskeletal: Strength & Muscle Tone: within normal limits Gait & Station: normal Patient leans: N/A  Psychiatric Specialty Exam: Physical Exam  ROS  Blood pressure 97/51, pulse 64, temperature 97.6 F (36.4 C), temperature source Oral, resp. rate 18, SpO2 100 %.There is no weight on file to calculate BMI.  General Appearance: Casual  Eye Contact::  Good  Speech:  Clear and Coherent and Normal Rate  Volume:  Normal  Mood:  Anxious and Depressed  Affect:  Congruent and Depressed  Thought Process:  Coherent, Goal Directed and Intact  Orientation:  Full (Time, Place, and Person)  Thought Content:  Hallucinations: Auditory Visual  Suicidal Thoughts:  Yes.  without intent/plan  Homicidal Thoughts:  No  Memory:  Immediate;   Good Recent;   Good Remote;   Good  Judgement:  Fair  Insight:  Good  Psychomotor Activity:  Psychomotor Retardation  Concentration:  Good  Recall:  NA  Fund of Knowledge:Fair  Language: Fair  Akathisia:  NA  Handed:  Right  AIMS (if indicated):     Assets:  Desire for Improvement  ADL's:  Intact  Cognition: WNL  Sleep:      Medical Decision Making: Review of Psycho-Social Stressors (1), Established Problem, Worsening (2), Review of Medication Regimen & Side Effects (2) and Review of New Medication or Change in Dosage (2)  Treatment Plan Summary: Daily contact with patient to assess and evaluate symptoms and progress in treatment and Medication management  Plan:  Resume all home medications. Disposition:  Admit, seek placement  Delfin Gant   PMHNP-BC 01/04/2015 10:19 AM Patient seen face-to-face for psychiatric evaluation, chart reviewed and case discussed with the physician extender and developed treatment plan. Reviewed the information documented and agree with the treatment  plan. Corena Pilgrim, MD

## 2015-01-10 ENCOUNTER — Ambulatory Visit (HOSPITAL_COMMUNITY): Payer: Self-pay | Admitting: Clinical

## 2015-01-24 ENCOUNTER — Encounter (HOSPITAL_COMMUNITY): Payer: Self-pay | Admitting: Clinical

## 2015-01-24 ENCOUNTER — Ambulatory Visit (INDEPENDENT_AMBULATORY_CARE_PROVIDER_SITE_OTHER): Payer: BLUE CROSS/BLUE SHIELD | Admitting: Clinical

## 2015-01-24 DIAGNOSIS — F200 Paranoid schizophrenia: Secondary | ICD-10-CM

## 2015-01-24 DIAGNOSIS — F4310 Post-traumatic stress disorder, unspecified: Secondary | ICD-10-CM | POA: Diagnosis not present

## 2015-01-31 ENCOUNTER — Ambulatory Visit (INDEPENDENT_AMBULATORY_CARE_PROVIDER_SITE_OTHER): Payer: BLUE CROSS/BLUE SHIELD | Admitting: Psychiatry

## 2015-01-31 ENCOUNTER — Encounter (HOSPITAL_COMMUNITY): Payer: Self-pay | Admitting: Psychiatry

## 2015-01-31 VITALS — BP 122/77 | HR 75 | Ht 72.0 in | Wt 197.0 lb

## 2015-01-31 DIAGNOSIS — F4310 Post-traumatic stress disorder, unspecified: Secondary | ICD-10-CM | POA: Diagnosis not present

## 2015-01-31 DIAGNOSIS — F200 Paranoid schizophrenia: Secondary | ICD-10-CM | POA: Diagnosis not present

## 2015-01-31 MED ORDER — HALOPERIDOL 10 MG PO TABS: 10.0000 mg | ORAL_TABLET | Freq: Two times a day (BID) | ORAL | Status: DC

## 2015-01-31 MED ORDER — DIVALPROEX SODIUM ER 500 MG PO TB24: 500.0000 mg | ORAL_TABLET | Freq: Two times a day (BID) | ORAL | Status: DC

## 2015-01-31 NOTE — Progress Notes (Signed)
   THERAPIST PROGRESS NOTE  Session Time: 4:30 -5:00  Participation Level: active then  Minimal   Behavioral Response: DisheveledConfusedAnxious  Type of Therapy: Individual Therapy  Treatment Goals addressed: Improve Psychiatric Symptoms, Emotional Regulation Skills, Calming Skills, Healthy Coping Skills  Interventions: Motivational Interviewing, Cognitive Behavior Therapy and Grounding/Mindfulness Skills  Summary: Danielle [name] is a 44 y.o. female who presents with Schizophrenia and PTSD.   Suicidal/Homicidal: No - without intent/plan  Therapist Response:  Shakela met with clinician for an individual session. Sianna's husband joined the session to support her. Najai discussed her psychiatric symptoms and her current life events. Kaliah shared that she had a lot to share with clinician. She shared that she had gone to the hospital recently after running out of medication and being able unable to get a refill. She shared that it was okay in the hospital for her. She shared that she had stayed for several days for medication management. Client and clinician discussed her feelings and emotions she shared that she was having less hallucinations she shared that she is medication for her panic attacks. Jasslyn started to say that she wanted to talk about something else. But when both clinician and husband had asked her what she wanted to talk about she stared off into space and then closed her eyes. When asked again she said she could not remember. When asked if it was time for the end of the session she said yes. But as husband was helping her into a wheelchair. She asked what she had done wrong. clinician and has been both assured her that she had not done anything wrong.  Plan: Return again in 2 weeks.  Diagnosis:     Axis I: Schizophrenia and PTSD       Powell,Frances A, LCSW 01/24/2015
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
## 2015-02-13 ENCOUNTER — Other Ambulatory Visit (HOSPITAL_COMMUNITY): Payer: Self-pay | Admitting: Psychiatry

## 2015-02-20 ENCOUNTER — Telehealth (HOSPITAL_COMMUNITY): Payer: Self-pay

## 2015-02-20 DIAGNOSIS — F200 Paranoid schizophrenia: Secondary | ICD-10-CM

## 2015-02-20 MED ORDER — TRAZODONE HCL 100 MG PO TABS: 100.0000 mg | ORAL_TABLET | Freq: Every day | ORAL | Status: DC

## 2015-02-20 NOTE — Telephone Encounter (Signed)
Met with Dr. Adele Schilder who authorized a one time refill of patient's prescribed Trazodone and a new one time order e-scribed to patient's [facility] in [place]. Called patient to verify a new order for her Trazodone had been authorized by Dr. Adele Schilder and [facility] [place].

## 2015-02-21 ENCOUNTER — Ambulatory Visit (INDEPENDENT_AMBULATORY_CARE_PROVIDER_SITE_OTHER): Payer: BLUE CROSS/BLUE SHIELD | Admitting: Clinical

## 2015-02-21 DIAGNOSIS — F200 Paranoid schizophrenia: Secondary | ICD-10-CM | POA: Diagnosis not present

## 2015-02-21 DIAGNOSIS — F4310 Post-traumatic stress disorder, unspecified: Secondary | ICD-10-CM
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
## 2015-02-24 ENCOUNTER — Encounter (HOSPITAL_COMMUNITY): Payer: Self-pay | Admitting: Clinical

## 2015-02-28 ENCOUNTER — Encounter (HOSPITAL_COMMUNITY): Payer: Self-pay | Admitting: Clinical

## 2015-02-28 ENCOUNTER — Ambulatory Visit (INDEPENDENT_AMBULATORY_CARE_PROVIDER_SITE_OTHER): Payer: BLUE CROSS/BLUE SHIELD | Admitting: Clinical

## 2015-02-28 DIAGNOSIS — F200 Paranoid schizophrenia: Secondary | ICD-10-CM | POA: Diagnosis not present

## 2015-02-28 DIAGNOSIS — F4310 Post-traumatic stress disorder, unspecified: Secondary | ICD-10-CM | POA: Diagnosis not present
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
## 2015-03-15 ENCOUNTER — Ambulatory Visit (INDEPENDENT_AMBULATORY_CARE_PROVIDER_SITE_OTHER): Payer: BLUE CROSS/BLUE SHIELD | Admitting: Psychiatry

## 2015-03-15 ENCOUNTER — Other Ambulatory Visit (HOSPITAL_COMMUNITY): Payer: Self-pay | Admitting: Psychiatry

## 2015-03-15 VITALS — BP 128/89 | HR 78 | Ht 71.0 in | Wt 193.4 lb

## 2015-03-15 DIAGNOSIS — F4310 Post-traumatic stress disorder, unspecified: Secondary | ICD-10-CM | POA: Diagnosis not present

## 2015-03-15 DIAGNOSIS — F200 Paranoid schizophrenia: Secondary | ICD-10-CM

## 2015-03-15 MED ORDER — TRAZODONE HCL 100 MG PO TABS: 100.0000 mg | ORAL_TABLET | Freq: Every day | ORAL | Status: DC

## 2015-03-15 MED ORDER — DIVALPROEX SODIUM ER 500 MG PO TB24: 500.0000 mg | ORAL_TABLET | Freq: Two times a day (BID) | ORAL | Status: DC

## 2015-03-15 MED ORDER — BENZTROPINE MESYLATE 0.5 MG PO TABS: 0.5000 mg | ORAL_TABLET | Freq: Two times a day (BID) | ORAL | Status: DC

## 2015-03-15 MED ORDER — HALOPERIDOL 5 MG PO TABS: 5.0000 mg | ORAL_TABLET | Freq: Two times a day (BID) | ORAL | Status: DC

## 2015-03-16 ENCOUNTER — Encounter (HOSPITAL_COMMUNITY): Payer: Self-pay | Admitting: Psychiatry

## 2015-03-16 NOTE — Telephone Encounter (Signed)
Received [facility] from [facility] for Cogentin 0.5mg , #60. Per Dr. Lolly MustacheArfeen, medication request is denied. Prescription for Cogentin 0.5mg , #60 was sent to pharmacy on 03/15/15 with 2 additional refills. Pt is schedule for a follow up appt on 05/15/15.
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
## 2015-03-21 ENCOUNTER — Encounter (HOSPITAL_COMMUNITY): Payer: Self-pay | Admitting: Clinical

## 2015-03-21 ENCOUNTER — Ambulatory Visit (INDEPENDENT_AMBULATORY_CARE_PROVIDER_SITE_OTHER): Payer: BLUE CROSS/BLUE SHIELD | Admitting: Clinical

## 2015-03-21 DIAGNOSIS — F4310 Post-traumatic stress disorder, unspecified: Secondary | ICD-10-CM | POA: Diagnosis not present

## 2015-03-21 DIAGNOSIS — F200 Paranoid schizophrenia: Secondary | ICD-10-CM | POA: Diagnosis not present

## 2015-03-21 NOTE — Progress Notes (Signed)
   THERAPIST PROGRESS NOTE  Session Time: 4:30 -5:25  Participation Level: Active  Behavioral Response: CasualAlertDepressed  Type of Therapy: Individual Therapy  Treatment Goals addressed: Improve Psychiatric Symptoms, Emotional Regulation Skills, Calming Skills, Healthy Coping Skills  Interventions: Motivational Interviewing, Cognitive Behavior Therapy and Grounding/Mindfulness Skills  Summary: [name] is a 44 y.o. female who presents with Schizophrenia and PTSD.   Suicidal/Homicidal: No - without intent/plan  Therapist Response:  [name] met with clinician for an individual session. [name]'s daughter and son joined the session to support her (son for 5 min , daughter for about 15 minutes at the begin. [name] discussed her psychiatric symptoms, her current life events. [name] introduced her children and then shared that she would like to discuss her father (it is the anniversary of his death.) [name]'s son chose to leave the room when she said she would like to discuss her Father. [name]'s daughter stayed in the room briefly. [name] and her daughter shared about [name]'s father. They miss him very much. [name] shared that she felt very guilty about his death because she was not there. [name]'s daughter assured her it was not her fault.  [name]'s daughter were crying. Clinician had [name] teach her daughter the 10x3 grounding technique. They practiced it together and [name]'s daughter was centered again. She kissed her Mom and said " I know the rest of the session will be fine. I will see you at home." [name] and clinician discussed the grounding technique. [name] shared more about her father's death. Clinician asked open ended questions. [name] and clinician discussed her thoughts and feelings about her guilt. Client and clinician discussed the fact that he died in the night after she had gone home and said she would be back in the morning. [name] shared that he wouldn't have wanted her to see him die but  also felt horrible about not being there. Client and clinician discussed the truth that everyone got to say good bye before he died. She shared he wouldn't have wanted her to see him die. [name] recognized that she had nothing to be guilty of. She then shared some of the good memories of her father. [name] and clinician discussed the emotional regulation and calming skills they used during the session. [name] and clinician discussed how she could focus on her positive memories to help her balance her since of loss.  Plan: Return again in 2 weeks.  Diagnosis:     Axis I: Schizophrenia and PTSD   Keslyn Teater A, LCSW 03/21/2015

## 2015-04-05 ENCOUNTER — Ambulatory Visit (HOSPITAL_COMMUNITY): Payer: Self-pay | Admitting: Psychiatry

## 2015-04-24 ENCOUNTER — Ambulatory Visit (INDEPENDENT_AMBULATORY_CARE_PROVIDER_SITE_OTHER): Payer: BLUE CROSS/BLUE SHIELD | Admitting: Clinical

## 2015-04-24 ENCOUNTER — Encounter (HOSPITAL_COMMUNITY): Payer: Self-pay | Admitting: Clinical

## 2015-04-24 DIAGNOSIS — F200 Paranoid schizophrenia: Secondary | ICD-10-CM

## 2015-04-24 DIAGNOSIS — F4310 Post-traumatic stress disorder, unspecified: Secondary | ICD-10-CM

## 2015-04-24 NOTE — Progress Notes (Signed)
   THERAPIST PROGRESS NOTE  Session Time: 4:30 -5:30  Participation Level: Active  Behavioral Response: DisheveledAlertDepressed  Type of Therapy: Individual Therapy  Treatment Goals addressed: Improve Psychiatric Symptoms, Emotional Regulation Skills, Calming Skills, Healthy Coping Skills  Interventions: Motivational Interviewing, Cognitive Behavior Therapy and Grounding/Mindfulness Skills  Summary: Danielle [name] is a 44 y.o. female who presents with Schizophrenia and PTSD.   Suicidal/Homicidal: No - without intent/plan  Therapist Response:  Danielle [name] met with clinician for an individual session. Danielle [name]'s husband joined the session to support her. Danielle [name] discussed her psychiatric symptoms and her current life events. Danielle [name] and clinician reviewed and updated her assessment. Danielle [name] reflected on the progress she made, the progress she would like to make, and her continued need for therapy both to maintain progress and to improve her coping skills. She shared that her brother had sent some Christmas funds to the family, but had excluded her daughter. She shared that her daughter is currently participating in a program so that she can regain custody of her children. She shared that she is proud of her daughter for doing so.  She shared that her brother excluded her daughter from the funds and this made her feel really bad and responsible. Client and clinician discussed her daughter's issue and the likelihood that money could be a trigger for her , taking her farther away from her goal.  . Client and clinician discussed healthy coping skills to deal with the challenges related to her daughter, the inability to see her grandbabies and holiday stress in general. Danielle [name] and clinician practiced a grounding skill together. Danielle [name] shared about her other children and positive changes she sees in them. Danielle [name] shared about feeling loved by her husband.   Plan: Return again in 2 weeks.  Diagnosis:     Axis I:  Schizophrenia and PTSD    Iniko Robles A, LCSW 04/24/2015
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
## 2015-05-14 ENCOUNTER — Other Ambulatory Visit (HOSPITAL_COMMUNITY): Payer: Self-pay | Admitting: Psychiatry

## 2015-05-15 ENCOUNTER — Ambulatory Visit (HOSPITAL_COMMUNITY): Payer: Self-pay | Admitting: Psychiatry

## 2015-05-16 ENCOUNTER — Other Ambulatory Visit (HOSPITAL_COMMUNITY): Payer: Self-pay | Admitting: Psychiatry

## 2015-05-16 ENCOUNTER — Telehealth (HOSPITAL_COMMUNITY): Payer: Self-pay

## 2015-05-16 DIAGNOSIS — F200 Paranoid schizophrenia: Secondary | ICD-10-CM

## 2015-05-16 MED ORDER — HALOPERIDOL 5 MG PO TABS: 5.0000 mg | ORAL_TABLET | Freq: Two times a day (BID) | ORAL | Status: DC

## 2015-05-16 MED ORDER — BENZTROPINE MESYLATE 0.5 MG PO TABS: 0.5000 mg | ORAL_TABLET | Freq: Two times a day (BID) | ORAL | Status: DC

## 2015-05-16 MED ORDER — TRAZODONE HCL 100 MG PO TABS: 100.0000 mg | ORAL_TABLET | Freq: Every day | ORAL | Status: DC

## 2015-05-16 MED ORDER — DIVALPROEX SODIUM ER 500 MG PO TB24: 500.0000 mg | ORAL_TABLET | Freq: Two times a day (BID) | ORAL | Status: DC

## 2015-05-16 NOTE — Telephone Encounter (Signed)
Message left for patient's husband new orders were e-scribed into patient's [facility] in [place] this date by Dr. Lolly MustacheArfeen.

## 2015-05-16 NOTE — Telephone Encounter (Signed)
Telephone call with patient's husband who requests to go back up on patient's Haldol po medication.  States patient has had increased positive auditory hallucinations with depression as these are telling her she is no good.  Discussed with Dr. Lolly MustacheArfeen who reports patient can go up on Haldol to 5 mg in the morning and 7.5 mg at night only but does not want to go higher due to fall risk and problems in the past with hypertension.  Agreed to inform patient's husband to try this for a few days and then to call back if still problems but to keep appointment on 06/02/15.  Called Mr. Amedeo PlentyMensch back and provided instructions which he stated understanding and will call back with report of how this works with patient's symptoms.

## 2015-06-02 ENCOUNTER — Encounter (HOSPITAL_COMMUNITY): Payer: Self-pay | Admitting: Psychiatry

## 2015-06-02 ENCOUNTER — Telehealth (HOSPITAL_COMMUNITY): Payer: Self-pay | Admitting: *Deleted

## 2015-06-02 ENCOUNTER — Ambulatory Visit (INDEPENDENT_AMBULATORY_CARE_PROVIDER_SITE_OTHER): Payer: BLUE CROSS/BLUE SHIELD | Admitting: Psychiatry

## 2015-06-02 VITALS — BP 114/77 | HR 62 | Ht 71.0 in | Wt 208.0 lb

## 2015-06-02 DIAGNOSIS — F200 Paranoid schizophrenia: Secondary | ICD-10-CM | POA: Diagnosis not present

## 2015-06-02 DIAGNOSIS — F4310 Post-traumatic stress disorder, unspecified: Secondary | ICD-10-CM | POA: Diagnosis not present

## 2015-06-02 MED ORDER — ARIPIPRAZOLE 10 MG PO TBDP: 10.0000 mg | ORAL_TABLET | Freq: Every day | ORAL | Status: DC

## 2015-06-02 MED ORDER — TRAZODONE HCL 100 MG PO TABS: 100.0000 mg | ORAL_TABLET | Freq: Every day | ORAL | Status: DC

## 2015-06-02 MED ORDER — DIVALPROEX SODIUM ER 500 MG PO TB24: 500.0000 mg | ORAL_TABLET | Freq: Two times a day (BID) | ORAL | Status: DC
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
## 2015-06-02 NOTE — Telephone Encounter (Signed)
Called [phone] spoke with Shorteu W. Initiated prior authorization for aripiprazole. Was told that someone would fax request for more information once members policy is reviewed. Explained that office is closed on weekend and asked for urgent request. Was still told that we would get a call or fax when approved or if more information is required.

## 2015-06-06 ENCOUNTER — Ambulatory Visit (INDEPENDENT_AMBULATORY_CARE_PROVIDER_SITE_OTHER): Payer: BLUE CROSS/BLUE SHIELD | Admitting: Clinical

## 2015-06-06 ENCOUNTER — Encounter (HOSPITAL_COMMUNITY): Payer: Self-pay | Admitting: Clinical

## 2015-06-06 DIAGNOSIS — F4310 Post-traumatic stress disorder, unspecified: Secondary | ICD-10-CM

## 2015-06-06 DIAGNOSIS — F200 Paranoid schizophrenia: Secondary | ICD-10-CM

## 2015-06-06 NOTE — Progress Notes (Signed)
   THERAPIST PROGRESS NOTE  Session Time: 4:30 - 5:25  Participation Level: Active  Behavioral Response: DisheveledConfusedAnxious and Depressed  Type of Therapy: Individual Therapy  Treatment Goals addressed: Improve Psychiatric Symptoms,  Calming Skills,   Interventions: Motivational Interviewing,  Therapy and Grounding/Mindfulness Skills  Summary: Danielle [name] is a 45 y.o. female who presents with Schizophrenia and PTSD.   Suicidal/Homicidal: No - without intent/plan  Therapist Response:  [name] met with clinician for an individual session. [name]'s husband joined the session to support her. [name] discussed her psychiatric symptoms and her current life events. [name] voice was coming in and out - allowing her talk at sometimes and other times only wisper.She shared that she has been falling more. She shared that the doctors are trying to figure out the cause of the falls and will be looking into the issue with her voice. [name] also passes out daily for a minute or two. She did this while in clinician's office. Husband shared that this had happened in the doctor's office and scans and blood test were done, but there is no known cause at this point.  She shared that she was experiencing more hallucinations than usual. She was actively hallucinating during the session.  She shared she had recently been experiencing  Nightmares about her past. [name] hit her leg. Client and clinician used a grounding technique which helped briefly. Client and clinician and husband discussed whether or not she needed hospitalization for med management. All agreed that they know what to do if things got worse ( if she was self harming or a threat to others, or hallucinations increased). [name] stated she would prefer to be at home while medication is managed. [name] and clinician used another grounding technique. Client, and husband agreed to follow safety plan as needed.  Plan: Return again in 2 weeks.  Diagnosis:      Axis I: Schizophrenia and PTSD    Kenlee Maler A, LCSW 06/06/2015

## 2015-06-07 ENCOUNTER — Encounter (HOSPITAL_COMMUNITY): Payer: Self-pay | Admitting: Emergency Medicine

## 2015-06-07 ENCOUNTER — Telehealth (HOSPITAL_COMMUNITY): Payer: Self-pay

## 2015-06-07 ENCOUNTER — Emergency Department (HOSPITAL_COMMUNITY)
Admission: EM | Admit: 2015-06-07 | Discharge: 2015-06-07 | Payer: BLUE CROSS/BLUE SHIELD | Attending: Emergency Medicine | Admitting: Emergency Medicine

## 2015-06-07 ENCOUNTER — Inpatient Hospital Stay
Admission: EM | Admit: 2015-06-07 | Discharge: 2015-06-19 | DRG: 885 | Disposition: A | Discharge status: Home / Self Care | Payer: BLUE CROSS/BLUE SHIELD | Source: Intra-hospital | Attending: Psychiatry | Admitting: Psychiatry

## 2015-06-07 DIAGNOSIS — E7220 Disorder of urea cycle metabolism, unspecified: Secondary | ICD-10-CM | POA: Diagnosis present

## 2015-06-07 DIAGNOSIS — E039 Hypothyroidism, unspecified: Secondary | ICD-10-CM | POA: Insufficient documentation

## 2015-06-07 DIAGNOSIS — R45851 Suicidal ideations: Secondary | ICD-10-CM | POA: Insufficient documentation

## 2015-06-07 DIAGNOSIS — R011 Cardiac murmur, unspecified: Secondary | ICD-10-CM | POA: Insufficient documentation

## 2015-06-07 DIAGNOSIS — F17200 Nicotine dependence, unspecified, uncomplicated: Secondary | ICD-10-CM | POA: Diagnosis present

## 2015-06-07 DIAGNOSIS — F419 Anxiety disorder, unspecified: Secondary | ICD-10-CM | POA: Diagnosis not present

## 2015-06-07 DIAGNOSIS — F209 Schizophrenia, unspecified: Secondary | ICD-10-CM | POA: Diagnosis present

## 2015-06-07 DIAGNOSIS — F4310 Post-traumatic stress disorder, unspecified: Secondary | ICD-10-CM | POA: Diagnosis not present

## 2015-06-07 DIAGNOSIS — G43909 Migraine, unspecified, not intractable, without status migrainosus: Secondary | ICD-10-CM | POA: Diagnosis present

## 2015-06-07 DIAGNOSIS — F319 Bipolar disorder, unspecified: Secondary | ICD-10-CM | POA: Insufficient documentation

## 2015-06-07 DIAGNOSIS — Z87891 Personal history of nicotine dependence: Secondary | ICD-10-CM | POA: Diagnosis not present

## 2015-06-07 DIAGNOSIS — Z7982 Long term (current) use of aspirin: Secondary | ICD-10-CM | POA: Diagnosis not present

## 2015-06-07 DIAGNOSIS — F200 Paranoid schizophrenia: Principal | ICD-10-CM | POA: Diagnosis present

## 2015-06-07 DIAGNOSIS — Z79899 Other long term (current) drug therapy: Secondary | ICD-10-CM | POA: Insufficient documentation

## 2015-06-07 DIAGNOSIS — Z008 Encounter for other general examination: Secondary | ICD-10-CM | POA: Diagnosis present

## 2015-06-07 DIAGNOSIS — R296 Repeated falls: Secondary | ICD-10-CM | POA: Diagnosis present

## 2015-06-07 DIAGNOSIS — Z862 Personal history of diseases of the blood and blood-forming organs and certain disorders involving the immune mechanism: Secondary | ICD-10-CM | POA: Insufficient documentation

## 2015-06-07 DIAGNOSIS — W19XXXA Unspecified fall, initial encounter: Secondary | ICD-10-CM

## 2015-06-07 LAB — SALICYLATE LEVEL

## 2015-06-07 LAB — RAPID URINE DRUG SCREEN, HOSP PERFORMED
Amphetamines: NOT DETECTED
Barbiturates: NOT DETECTED
Benzodiazepines: NOT DETECTED
COCAINE: NOT DETECTED
OPIATES: NOT DETECTED
Tetrahydrocannabinol: NOT DETECTED

## 2015-06-07 LAB — CBC
HCT: 36.9 % (ref 36.0–46.0)
Hemoglobin: 12.1 g/dL (ref 12.0–15.0)
MCH: 31.2 pg (ref 26.0–34.0)
MCHC: 32.8 g/dL (ref 30.0–36.0)
MCV: 95.1 fL (ref 78.0–100.0)
PLATELETS: 243 10*3/uL (ref 150–400)
RBC: 3.88 MIL/uL (ref 3.87–5.11)
RDW: 12.6 % (ref 11.5–15.5)
WBC: 7.6 10*3/uL (ref 4.0–10.5)

## 2015-06-07 LAB — ETHANOL

## 2015-06-07 LAB — COMPREHENSIVE METABOLIC PANEL
ALK PHOS: 50 U/L (ref 38–126)
ALT: 13 U/L — AB (ref 14–54)
ANION GAP: 9 (ref 5–15)
AST: 17 U/L (ref 15–41)
Albumin: 4.3 g/dL (ref 3.5–5.0)
BUN: 21 mg/dL — ABNORMAL HIGH (ref 6–20)
CALCIUM: 8.9 mg/dL (ref 8.9–10.3)
CHLORIDE: 108 mmol/L (ref 101–111)
CO2: 18 mmol/L — ABNORMAL LOW (ref 22–32)
CREATININE: 0.84 mg/dL (ref 0.44–1.00)
Glucose, Bld: 91 mg/dL (ref 65–99)
Potassium: 4.1 mmol/L (ref 3.5–5.1)
Sodium: 135 mmol/L (ref 135–145)
Total Bilirubin: 0.3 mg/dL (ref 0.3–1.2)
Total Protein: 7.5 g/dL (ref 6.5–8.1)

## 2015-06-07 LAB — ACETAMINOPHEN LEVEL

## 2015-06-07 LAB — VALPROIC ACID LEVEL: Valproic Acid Lvl: 66 ug/mL (ref 50.0–100.0)

## 2015-06-07 MED ORDER — DIVALPROEX SODIUM ER 500 MG PO TB24
500.0000 mg | ORAL_TABLET | Freq: Two times a day (BID) | ORAL | Status: DC
Start: 2015-06-08 — End: 2015-06-08
  Administered 2015-06-08: 500 mg via ORAL
  Filled 2015-06-07: qty 1

## 2015-06-07 MED ORDER — NICOTINE 21 MG/24HR TD PT24: 21.0000 mg | MEDICATED_PATCH | Freq: Every day | TRANSDERMAL | Status: DC | PRN

## 2015-06-07 MED ORDER — LEVOTHYROXINE SODIUM 75 MCG PO TABS
75.0000 ug | ORAL_TABLET | Freq: Every day | ORAL | Status: DC
  Filled 2015-06-07: qty 1

## 2015-06-07 MED ORDER — NICOTINE 21 MG/24HR TD PT24
21.0000 mg | MEDICATED_PATCH | Freq: Every day | TRANSDERMAL | Status: DC | PRN
  Filled 2015-06-07: qty 1

## 2015-06-07 MED ORDER — BENZTROPINE MESYLATE 1 MG PO TABS
0.5000 mg | ORAL_TABLET | Freq: Two times a day (BID) | ORAL | Status: DC
  Administered 2015-06-07: 0.5 mg via ORAL
  Filled 2015-06-07 (×2): qty 1

## 2015-06-07 MED ORDER — LORAZEPAM 1 MG PO TABS
1.0000 mg | ORAL_TABLET | Freq: Once | ORAL | Status: AC
  Administered 2015-06-07: 1 mg via ORAL
  Filled 2015-06-07: qty 1

## 2015-06-07 MED ORDER — MAGNESIUM HYDROXIDE 400 MG/5ML PO SUSP: 30.0000 mL | Freq: Every day | ORAL | Status: DC | PRN

## 2015-06-07 MED ORDER — TRAZODONE HCL 100 MG PO TABS
100.0000 mg | ORAL_TABLET | Freq: Every day | ORAL | Status: DC
  Administered 2015-06-08 – 2015-06-18 (×11): 100 mg via ORAL
  Filled 2015-06-07 (×12): qty 1

## 2015-06-07 MED ORDER — ALUM & MAG HYDROXIDE-SIMETH 200-200-20 MG/5ML PO SUSP: 30.0000 mL | ORAL | Status: DC | PRN

## 2015-06-07 MED ORDER — ARIPIPRAZOLE 10 MG PO TABS
10.0000 mg | ORAL_TABLET | Freq: Every day | ORAL | Status: DC
  Administered 2015-06-07: 10 mg via ORAL
  Filled 2015-06-07: qty 1

## 2015-06-07 MED ORDER — TOPIRAMATE 25 MG PO TABS
100.0000 mg | ORAL_TABLET | Freq: Two times a day (BID) | ORAL | Status: DC
Start: 2015-06-08 — End: 2015-06-08
  Administered 2015-06-08: 100 mg via ORAL
  Filled 2015-06-07: qty 4

## 2015-06-07 MED ORDER — DIVALPROEX SODIUM ER 500 MG PO TB24
500.0000 mg | ORAL_TABLET | Freq: Two times a day (BID) | ORAL | Status: DC
  Administered 2015-06-07: 500 mg via ORAL
  Filled 2015-06-07 (×2): qty 1

## 2015-06-07 MED ORDER — HALOPERIDOL 5 MG PO TABS
5.0000 mg | ORAL_TABLET | Freq: Two times a day (BID) | ORAL | Status: DC
  Administered 2015-06-08: 5 mg via ORAL
  Filled 2015-06-07: qty 1

## 2015-06-07 MED ORDER — LEVOTHYROXINE SODIUM 75 MCG PO TABS
75.0000 ug | ORAL_TABLET | Freq: Every day | ORAL | Status: DC
  Administered 2015-06-08 – 2015-06-19 (×12): 75 ug via ORAL
  Filled 2015-06-07: qty 1
  Filled 2015-06-07: qty 3
  Filled 2015-06-07 (×4): qty 1
  Filled 2015-06-07: qty 3
  Filled 2015-06-07 (×7): qty 1

## 2015-06-07 MED ORDER — ONDANSETRON HCL 4 MG PO TABS: 4.0000 mg | ORAL_TABLET | Freq: Three times a day (TID) | ORAL | Status: DC | PRN

## 2015-06-07 MED ORDER — ASPIRIN EC 81 MG PO TBEC
81.0000 mg | DELAYED_RELEASE_TABLET | Freq: Every day | ORAL | Status: DC
  Administered 2015-06-08 – 2015-06-19 (×12): 81 mg via ORAL
  Filled 2015-06-07 (×14): qty 1

## 2015-06-07 MED ORDER — IBUPROFEN 600 MG PO TABS
600.0000 mg | ORAL_TABLET | Freq: Three times a day (TID) | ORAL | Status: DC | PRN
  Administered 2015-06-09 – 2015-06-19 (×14): 600 mg via ORAL
  Filled 2015-06-07 (×17): qty 1

## 2015-06-07 MED ORDER — ARIPIPRAZOLE 10 MG PO TABS
10.0000 mg | ORAL_TABLET | Freq: Every day | ORAL | Status: DC
  Administered 2015-06-08 – 2015-06-09 (×2): 10 mg via ORAL
  Filled 2015-06-07 (×2): qty 1

## 2015-06-07 MED ORDER — ACETAMINOPHEN 325 MG PO TABS: 650.0000 mg | ORAL_TABLET | ORAL | Status: DC | PRN

## 2015-06-07 MED ORDER — IBUPROFEN 200 MG PO TABS
600.0000 mg | ORAL_TABLET | Freq: Three times a day (TID) | ORAL | Status: DC | PRN
  Administered 2015-06-07: 600 mg via ORAL
  Filled 2015-06-07: qty 3

## 2015-06-07 MED ORDER — TOPIRAMATE 100 MG PO TABS
100.0000 mg | ORAL_TABLET | Freq: Two times a day (BID) | ORAL | Status: DC
  Administered 2015-06-07: 100 mg via ORAL
  Filled 2015-06-07 (×2): qty 1

## 2015-06-07 MED ORDER — ASPIRIN EC 81 MG PO TBEC
81.0000 mg | DELAYED_RELEASE_TABLET | Freq: Every day | ORAL | Status: DC
  Filled 2015-06-07: qty 1

## 2015-06-07 MED ORDER — TRAZODONE HCL 100 MG PO TABS
100.0000 mg | ORAL_TABLET | Freq: Every day | ORAL | Status: DC
  Administered 2015-06-07: 100 mg via ORAL
  Filled 2015-06-07: qty 1

## 2015-06-07 MED ORDER — BENZTROPINE MESYLATE 1 MG PO TABS
0.5000 mg | ORAL_TABLET | Freq: Two times a day (BID) | ORAL | Status: DC
Start: 2015-06-08 — End: 2015-06-08
  Administered 2015-06-08: 0.5 mg via ORAL
  Filled 2015-06-07: qty 1

## 2015-06-07 MED ORDER — HALOPERIDOL 5 MG PO TABS
5.0000 mg | ORAL_TABLET | Freq: Two times a day (BID) | ORAL | Status: DC
  Administered 2015-06-07: 5 mg via ORAL
  Filled 2015-06-07 (×2): qty 1

## 2015-06-07 MED ORDER — AMMONIA AROMATIC IN INHA
RESPIRATORY_TRACT | Status: AC
  Filled 2015-06-07: qty 10

## 2015-06-07 NOTE — ED Notes (Signed)
Pt spoke in a soft whisper upon arrival to [facility]. Pt later started talking in a normal voice. Pt reports hallucinations as auditory saying "no good, stupid" and "dumb." Pt also reports visual hallucinations as "shadow people." Pt denies hi. She reports thoughts to hurt herself by cutting and denies si. Pt contracts with staff for safety.

## 2015-06-07 NOTE — BH Assessment (Signed)
Patient has been accepted to [facility].  Accepting physician is Dr. Ardyth Harps.  Attending Physician will be Dr. Jennet Maduro.  Patient has been assigned to room 322, by [facility] Charge Nurse Victorino Dike.  Call report to [phone].  Representative/Transfer Coordinator is Kalliope Riesen.  [facility] ER Staff (Jospehine, Nurse Practitioner) made aware of acceptance.   Patient can transfer to the unit after 9pm. That's when her bed will be available.

## 2015-06-07 NOTE — ED Notes (Signed)
Pt has upper dentures and is wearing them on the unit.

## 2015-06-07 NOTE — ED Notes (Signed)
Pt was able to ambulated into [facility] room w/o difficulty.  Pt started smiling when she saw [facility] staff.

## 2015-06-07 NOTE — BH Assessment (Addendum)
Assessment Note  Danielle [name] is a 45 year old white female that reports Si with a plan to overdose or cut herself with a knife.  Patient reports that she does not feel safe.   Patient reports that the voices are telling her to harm herself.  Patient reports that her psychiatric medication is not working.  Patient reports that she receives services for [facility] with Dr. Lolly Mustache   Patient denies HI/Substance Abuse.  Patient denies physical, sexual or emotional abuse.  Patient reports a history of psychiatric hospitalizations.  Patient reports that she has a history of suicide attempts by drowning taking pills and cutting herself.  During the assessment the patient began to hit her let repeatedly.  The patient husband had to calm her down.      Diagnosis: Schizophrenia   Past Medical History:  Past Medical History  Diagnosis Date  . Depressed   . Seizures (HCC)   . Heart murmur   . Hypothyroidism   . Anemia   . Headache(784.0)   . Anxiety   . PTSD (post-traumatic stress disorder)   . Shortness of breath   . Schizophrenia (HCC)   . Bipolar 1 disorder [facility])     Past Surgical History  Procedure Laterality Date  . Tubal ligation  1996  . Foot surgery      Family History:  Family History  Problem Relation Age of Onset  . Adopted: Yes    Social History:  reports that she quit smoking about 3 years ago. Her smoking use included Cigarettes. She has a 45 pack-year smoking history. She has never used smokeless tobacco. She reports that she does not drink alcohol or use illicit drugs.  Additional Social History:  Alcohol / Drug Use History of alcohol / drug use?: No history of alcohol / drug abuse  CIWA: CIWA-Ar BP: 107/76 mmHg Pulse Rate: 67 COWS:    Allergies:  Allergies  Allergen Reactions  . Tomato Rash    Pt reports that she breaks out in a rash if she eats tomato based foods but can eat a whole tomato  . Imitrex [Sumatriptan Base] Hives  .  Mustard Seed Rash    Also allergic to [facility] Medications:  (Not in a hospital admission)  OB/GYN Status:  No LMP recorded. Patient is postmenopausal.  General Assessment Data Location of Assessment: [facility] ED TTS Assessment: In system Is this a Tele or Face-to-Face Assessment?: Face-to-Face Is this an Initial Assessment or a Re-assessment for this encounter?: Initial Assessment Marital status: Married [place] name: NA Is patient pregnant?: No Pregnancy Status: No Living Arrangements: Spouse/significant other Can pt return to current living arrangement?: Yes Admission Status: Voluntary Is patient capable of signing voluntary admission?: Yes Referral Source: Self/Family/Friend Insurance type: Scientist, research (physical sciences) Exam [facility] Walk-in ONLY) Medical Exam completed: Yes  Crisis Care Plan Living Arrangements: Spouse/significant other Legal Guardian:  (NA) Name of Psychiatrist: [facility]  Name of Therapist: [facility]  Education Status Is patient currently in school?: No Current Grade: NA Highest grade of school patient has completed: NA Name of school: NA Contact person: NA  Risk to self with the past 6 months Suicidal Ideation: Yes-Currently Present Has patient been a risk to self within the past 6 months prior to admission? : No Suicidal Intent: Yes-Currently Present Has patient had any suicidal intent within the past 6 months prior to admission? : Yes Is patient at risk for suicide?:  Yes Suicidal Plan?: Yes-Currently Present Has patient had any suicidal plan within the past 6 months prior to admission? : Yes Specify Current Suicidal Plan: Taking pills or cutting herself Access to Means: Yes Specify Access to Suicidal Means: Pills and Knife What has been your use of drugs/alcohol within the last 12 months?: None Reported Previous Attempts/Gestures: Yes How many times?: 3 Other Self Harm Risks: None Reported  Triggers for  Past Attempts: Unpredictable (Hearing voices) Intentional Self Injurious Behavior: None Family Suicide History: No Recent stressful life event(s):  (Hearing voices telling her to harm herself) Persecutory voices/beliefs?: Yes Depression: Yes Depression Symptoms: Despondent, Loss of interest in usual pleasures, Feeling worthless/self pity Substance abuse history and/or treatment for substance abuse?: No Suicide prevention information given to non-admitted patients: Yes  Risk to Others within the past 6 months Homicidal Ideation: No Does patient have any lifetime risk of violence toward others beyond the six months prior to admission? : No Thoughts of Harm to Others: No Current Homicidal Intent: No Current Homicidal Plan: No Access to Homicidal Means: No Identified Victim: None Reported History of harm to others?: No Assessment of Violence: None Noted Violent Behavior Description: None Reported Does patient have access to weapons?: No Criminal Charges Pending?: No Does patient have a court date: No Is patient on probation?: No  Psychosis Hallucinations: Auditory, Visual Delusions: None noted  Mental Status Report Appearance/Hygiene: Disheveled, Poor hygiene Eye Contact: Poor Motor Activity: Freedom of movement, Restlessness Speech: Logical/coherent Level of Consciousness: Alert Mood: Depressed, Anxious, Suspicious Affect: Anxious, Blunted, Depressed Anxiety Level: None Thought Processes: Coherent, Relevant Judgement: Unimpaired Orientation: Person, Place, Time, Situation Obsessive Compulsive Thoughts/Behaviors: None  Cognitive Functioning Concentration: Decreased Memory: Recent Intact, Remote Intact IQ: Average Insight: Fair Impulse Control: Poor Appetite: Fair Weight Loss: 0 Weight Gain: 0 Sleep: Decreased Total Hours of Sleep: 5 Vegetative Symptoms: Decreased grooming, Staying in bed  ADLScreening [facility] Assessment Services) Patient's cognitive ability adequate  to safely complete daily activities?: Yes Patient able to express need for assistance with ADLs?: Yes Independently performs ADLs?: Yes (appropriate for developmental age)  Prior Inpatient Therapy Prior Inpatient Therapy: Yes Prior Therapy Dates: 2016 Prior Therapy Facilty/Provider(s): [facility] Reason for Treatment: Psychosis  Prior Outpatient Therapy Prior Outpatient Therapy: Yes Prior Therapy Dates: Ongoing  Prior Therapy Facilty/Provider(s): [facility]  Reason for Treatment: Medication Management  Does patient have an ACCT team?: No Does patient have Intensive In-House Services?  : No Does patient have [facility] services? : No Does patient have P4CC services?: No  ADL Screening (condition at time of admission) Patient's cognitive ability adequate to safely complete daily activities?: Yes Is the patient deaf or have difficulty hearing?: No Does the patient have difficulty seeing, even when wearing glasses/contacts?: No Does the patient have difficulty concentrating, remembering, or making decisions?: Yes Patient able to express need for assistance with ADLs?: Yes Does the patient have difficulty dressing or bathing?: No Independently performs ADLs?: Yes (appropriate for developmental age) Does the patient have difficulty walking or climbing stairs?: No Weakness of Legs: None Weakness of Arms/Hands: None  Home Assistive Devices/Equipment Home Assistive Devices/Equipment: None    Abuse/Neglect Assessment (Assessment to be complete while patient is alone) Physical Abuse: Denies Verbal Abuse: Denies Sexual Abuse: Denies Exploitation of patient/patient's resources: Denies Self-Neglect: Denies Values / Beliefs Cultural Requests During Hospitalization: None Spiritual Requests During Hospitalization: None Consults Spiritual Care Consult Needed: No Social Work Consult Needed: No Merchant navy officer (For Healthcare) Does patient have an advance directive?: No Would  patient like information on creating an advanced directive?: No - patient declined information    Additional Information 1:1 In Past 12 Months?: No CIRT Risk: No Elopement Risk: No Does patient have medical clearance?: Yes     Disposition: Per Dr. Jannifer Franklin - patient meets criteria for inpatient hospitalization.  TTS will seek placement.  Disposition Initial Assessment Completed for this Encounter: Yes Disposition of Patient: Inpatient treatment program Type of inpatient treatment program: Adult (Per Dr. Jannifer Franklin - patient meets criteria for inpatient hosp)  On Site Evaluation by:   Reviewed with Physician:    Phillip Heal LaVerne 06/07/2015 11:41 AM

## 2015-06-07 NOTE — ED Provider Notes (Signed)
CSN: [id number]     Arrival date & time 06/07/15  0903 History   First MD Initiated Contact with Patient 06/07/15 1010     Chief Complaint  Patient presents with  . Medical Clearance     (Consider location/radiation/quality/duration/timing/severity/associated sxs/prior Treatment) HPI   Danielle [name] is a 45 y.o. female who presents for evaluation of suicidal ideation, and "I want to hurt myself". Patient has been feeling suicidal for several days. Her psychiatrist has tried to start Abilify, but secondary to insurance problems. She has not been able to fill the prescription. She was in the psychiatrist's office yesterday, and today was instructed to come here "to get admitted". She is taking her Haldol as prescribed. She has a history of suicide attempts by drowning taking pills and cutting herself. She denies recent fever, chills, nausea, vomiting, weakness or dizziness. There are no other known modifying factors.   Past Medical History  Diagnosis Date  . Depressed   . Seizures (HCC)   . Heart murmur   . Hypothyroidism   . Anemia   . Headache(784.0)   . Anxiety   . PTSD (post-traumatic stress disorder)   . Shortness of breath   . Schizophrenia (HCC)   . Bipolar 1 disorder [facility])    Past Surgical History  Procedure Laterality Date  . Tubal ligation  1996  . Foot surgery     Family History  Problem Relation Age of Onset  . Adopted: Yes   Social History  Substance Use Topics  . Smoking status: Former Smoker -- 1.50 packs/day for 30 years    Types: Cigarettes    Quit date: 09/20/2011  . Smokeless tobacco: Never Used  . Alcohol Use: No   OB History    No data available     Review of Systems  All other systems reviewed and are negative.     Allergies  Tomato; Imitrex; and Mustard seed  Home Medications   Prior to Admission medications   Medication Sig Start Date End Date Taking? Authorizing Provider  aspirin EC 81 MG tablet Take 81 mg by mouth daily with  breakfast.   Yes Historical Provider, MD  benztropine (COGENTIN) 0.5 MG tablet Take 1 tablet (0.5 mg total) by mouth 2 (two) times daily. 05/16/15  Yes Cleotis Nipper, MD  divalproex (DEPAKOTE ER) 500 MG 24 hr tablet Take 1 tablet (500 mg total) by mouth 2 (two) times daily. 06/02/15  Yes Cleotis Nipper, MD  haloperidol (HALDOL) 5 MG tablet Take 5 mg by mouth 2 (two) times daily. 05/29/15  Yes Historical Provider, MD  HYDROcodone-acetaminophen (NORCO/VICODIN) 5-325 MG tablet Take 1-2 tablets by mouth every 6 (six) hours as needed for moderate pain.  05/18/15  Yes Historical Provider, MD  ibuprofen (ADVIL,MOTRIN) 800 MG tablet Take 800 mg by mouth every 8 (eight) hours as needed for headache or moderate pain.  05/18/15  Yes Historical Provider, MD  levothyroxine (SYNTHROID, LEVOTHROID) 75 MCG tablet Take 75 mcg by mouth daily before breakfast.  08/09/14  Yes Historical Provider, MD  rizatriptan (MAXALT) 10 MG tablet Take 10 mg by mouth as directed. Take 10 mg by mouth at onset of headache may repeat in 2 hours 05/11/15  Yes Historical Provider, MD  topiramate (TOPAMAX) 100 MG tablet Take 100 mg by mouth 2 (two) times daily.   Yes Historical Provider, MD  traZODone (DESYREL) 100 MG tablet Take 1 tablet (100 mg total) by mouth at bedtime. 06/02/15  Yes Cleotis Nipper, MD  aripiprazole (ABILIFY) 10 MG disintegrating tablet Take 1 tablet (10 mg total) by mouth daily. 06/02/15   Cleotis Nipper, MD   BP 99/72 mmHg  Pulse 70  Temp(Src) 98 F (36.7 C) (Oral)  Resp 16  SpO2 98% Physical Exam  Constitutional: She is oriented to person, place, and time. She appears well-developed.  Overweight  HENT:  Head: Normocephalic and atraumatic.  Right Ear: External ear normal.  Left Ear: External ear normal.  Eyes: Conjunctivae and EOM are normal. Pupils are equal, round, and reactive to light.  Neck: Normal range of motion and phonation normal. Neck supple.  Cardiovascular: Normal rate, regular rhythm and normal heart  sounds.   Pulmonary/Chest: Effort normal and breath sounds normal. She exhibits no bony tenderness.  Abdominal: Soft. There is no tenderness.  Musculoskeletal: Normal range of motion.  Neurological: She is alert and oriented to person, place, and time. No cranial nerve deficit or sensory deficit. She exhibits normal muscle tone. Coordination normal.  Skin: Skin is warm, dry and intact.  Psychiatric: Her behavior is normal. Judgment and thought content normal.  Depressed appearing  Nursing note and vitals reviewed.   ED Course  Procedures (including critical care time) Medications  ARIPiprazole (ABILIFY) tablet 10 mg (10 mg Oral Given 06/07/15 1146)  aspirin EC tablet 81 mg (not administered)  benztropine (COGENTIN) tablet 0.5 mg (0.5 mg Oral Not Given 06/07/15 1147)  divalproex (DEPAKOTE ER) 24 hr tablet 500 mg (500 mg Oral Not Given 06/07/15 1148)  haloperidol (HALDOL) tablet 5 mg (5 mg Oral Not Given 06/07/15 1148)  levothyroxine (SYNTHROID, LEVOTHROID) tablet 75 mcg (not administered)  topiramate (TOPAMAX) tablet 100 mg (100 mg Oral Not Given 06/07/15 1149)  traZODone (DESYREL) tablet 100 mg (not administered)  acetaminophen (TYLENOL) tablet 650 mg (not administered)  ibuprofen (ADVIL,MOTRIN) tablet 600 mg (not administered)  nicotine (NICODERM CQ - dosed in mg/24 hours) patch 21 mg (not administered)  ondansetron (ZOFRAN) tablet 4 mg (not administered)  alum & mag hydroxide-simeth (MAALOX/MYLANTA) 200-200-20 MG/5ML suspension 30 mL (not administered)    Patient Vitals for the past 24 hrs:  BP Temp Temp src Pulse Resp SpO2  06/07/15 1319 99/72 mmHg - - 70 16 98 %  06/07/15 0930 107/76 mmHg - - 67 16 98 %  06/07/15 0917 104/80 mmHg 98 F (36.7 C) Oral 66 18 99 %   13:40- patient had a brief episode of syncope, during which it was felt that she was alert. I reassessed the patient, she was sitting in a chair, talking, then stopped talking, had her head drift backwards, and then was noted to  have blinking of her eyes. She did not lose consciousness and during this period of decreased responsiveness, she was able to follow commands. After about a minute. She will come, was able to speak.  3:50 PM Reevaluation with update and discussion. After initial assessment and treatment, an updated evaluation reveals no change in clinical status. Patient is medically cleared for treatment by the psychiatric service.Mancel Bale L     Labs Review Labs Reviewed  COMPREHENSIVE METABOLIC PANEL - Abnormal; Notable for the following:    CO2 18 (*)    BUN 21 (*)    ALT 13 (*)    All other components within normal limits  ACETAMINOPHEN LEVEL - Abnormal; Notable for the following:    Acetaminophen (Tylenol), Serum <10 (*)    All other components within normal limits  ETHANOL  SALICYLATE LEVEL  CBC  URINE RAPID DRUG SCREEN,  HOSP PERFORMED  VALPROIC ACID LEVEL    Imaging Review No results found. I have personally reviewed and evaluated these images and lab results as part of my medical decision-making.   EKG Interpretation None      MDM   Final diagnoses:  Suicidal ideation  Schizophrenia, unspecified type (HCC)    Schizophrenia with suicidal ideation, which is recurrent. Patient is a does not have a clear plan for suicide. She's been having difficulty changing to a new medication for schizophrenia. She may improve after treatment with Abilify.   Nursing Notes Reviewed/ Care Coordinated Applicable Imaging Reviewed Interpretation of Laboratory Data incorporated into ED treatment   Plan: As per TTS in conjunction with oncoming provider team.  Mancel Bale, MD 06/07/15 [phone]

## 2015-06-07 NOTE — ED Notes (Signed)
Per pt, states they switched her Haldol to Abilify-unable to get Abilify due to pre authorization from insurance company-states she has been hitting herself and having increased anxiety-saw Psychiatrist yesterday-states patient wants to be admitted

## 2015-06-07 NOTE — ED Notes (Signed)
Bed: WBH37 Expected date:  Expected time:  Means of arrival:  Comments: Triage 4  

## 2015-06-07 NOTE — ED Notes (Signed)
Attempted blood draw with no success.     

## 2015-06-07 NOTE — ED Notes (Signed)
Pt has been changed into scrubs and wanded by security.  

## 2015-06-07 NOTE — BH Assessment (Addendum)
[facility] Assessment Progress Note  Per Dahlia Byes, NP, this pt requires psychiatric hospitalization at this time. Julieanne Cotton has been informed of pt's acceptance to [facility] and she concurs with this decision. Pt has signed Voluntary Admission and Consent for Treatment, as well as Consent to Release Information to Kathryne Sharper, MD and to Elana Alm, LCSW, her outpatient providers at the [facility], and a notification call has been placed. Pt's nurse, Jan, has been notified and agrees to call report to [phone]. Pt is to be transported via [facility] after 21:00.  Doylene Canning, MA Triage Specialist [phone]    Addendum:  Signed admission paperwork has been faxed to [phone].  Doylene Canning, MA Triage Specialist [phone]

## 2015-06-07 NOTE — ED Notes (Addendum)
Upon Security entering the room to wand the Pt, Pt had a syncopal episode.  Pt did not fall and was caught by security.  Pt quickly awakened when this RN arrived w/ ammonia.  Pt's husband reports "she has done this a couple times a day for 20 years or so."  Pt mouthed to her husband that she cannot talk.  Again, he reports that this has gone on for a long time and that her voice will return.  Effie Shy MD made aware, evaluated the Pt, and reported that she remains medically cleared.   Pt's husband took all belongings home.

## 2015-06-07 NOTE — ED Notes (Signed)
Pt reported to Clinical research associate that she was having a panic attack. Writer sat with pt and worked on deep breathing exercises and focusing. Pt appeared to be calming and a few minutes later used her fists to hit herself in the legs. Called EDP and received an order for PO ativan. Medication given as ordered. Safety maintained in the [facility].

## 2015-06-07 NOTE — BH Assessment (Signed)
Per Dr. Jannifer Franklin - patient meets criteria for inpatient hospitalization.

## 2015-06-08 ENCOUNTER — Encounter: Payer: Self-pay | Admitting: Psychiatry

## 2015-06-08 DIAGNOSIS — F17200 Nicotine dependence, unspecified, uncomplicated: Secondary | ICD-10-CM | POA: Diagnosis present

## 2015-06-08 DIAGNOSIS — E7220 Disorder of urea cycle metabolism, unspecified: Secondary | ICD-10-CM | POA: Diagnosis present

## 2015-06-08 DIAGNOSIS — F200 Paranoid schizophrenia: Principal | ICD-10-CM

## 2015-06-08 LAB — LIPID PANEL
CHOL/HDL RATIO: 6.1 ratio
CHOLESTEROL: 218 mg/dL — AB (ref 0–200)
HDL: 36 mg/dL — ABNORMAL LOW (ref >40)
LDL CALC: 139 mg/dL — AB (ref 0–99)
TRIGLYCERIDES: 215 mg/dL — AB (ref <150)
VLDL: 43 mg/dL — AB (ref 0–40)

## 2015-06-08 LAB — VITAMIN B12: VITAMIN B 12: 246 pg/mL (ref 180–914)

## 2015-06-08 LAB — VALPROIC ACID LEVEL: Valproic Acid Lvl: 62 ug/mL (ref 50.0–100.0)

## 2015-06-08 LAB — AMMONIA: Ammonia: 69 umol/L — ABNORMAL HIGH (ref 9–35)

## 2015-06-08 LAB — HEMOGLOBIN A1C: HEMOGLOBIN A1C: 5.5 % (ref 4.0–6.0)

## 2015-06-08 LAB — FOLATE: FOLATE: 5.5 ng/mL — AB (ref >5.9)

## 2015-06-08 LAB — TSH: TSH: 1.782 u[IU]/mL (ref 0.350–4.500)

## 2015-06-08 MED ORDER — LACTULOSE 10 GM/15ML PO SOLN
30.0000 g | Freq: Two times a day (BID) | ORAL | Status: DC
  Administered 2015-06-08 – 2015-06-12 (×9): 30 g via ORAL
  Filled 2015-06-08 (×11): qty 45

## 2015-06-08 MED ORDER — FOLIC ACID 1 MG PO TABS
1.0000 mg | ORAL_TABLET | Freq: Every day | ORAL | Status: DC
  Administered 2015-06-08 – 2015-06-19 (×12): 1 mg via ORAL
  Filled 2015-06-08 (×13): qty 1

## 2015-06-08 MED ORDER — CYANOCOBALAMIN 1000 MCG/ML IJ SOLN
1000.0000 ug | Freq: Every day | INTRAMUSCULAR | Status: AC
  Administered 2015-06-09 – 2015-06-11 (×3): 1000 ug via INTRAMUSCULAR
  Filled 2015-06-08 (×3): qty 1

## 2015-06-08 MED ORDER — CYANOCOBALAMIN 500 MCG PO TABS
1000.0000 ug | ORAL_TABLET | Freq: Every day | ORAL | Status: DC
  Administered 2015-06-11 – 2015-06-19 (×9): 1000 ug via ORAL
  Filled 2015-06-08 (×12): qty 2

## 2015-06-08 MED ORDER — TOPIRAMATE 100 MG PO TABS
100.0000 mg | ORAL_TABLET | Freq: Every day | ORAL | Status: DC
  Administered 2015-06-09 – 2015-06-14 (×6): 100 mg via ORAL
  Filled 2015-06-08 (×2): qty 4
  Filled 2015-06-08: qty 1
  Filled 2015-06-08: qty 4
  Filled 2015-06-08 (×3): qty 1
  Filled 2015-06-08 (×2): qty 4

## 2015-06-08 NOTE — Progress Notes (Signed)
Recreation Therapy Notes  Date: 02.02.17 Time: 3:00 pm Location: Craft Room  Group Topic: Leisure Education  Goal Area(s) Addresses:  Patient will identify things they are grateful for. Patient will identify how being grateful can influence decision making.  Behavioral Response: Did not attend  Intervention: Grateful Wheel  Activity: Patients were given an "I Am Grateful For" worksheet and instructed to list 2-3 things they are grateful for under each category.  Education: LRT educated patients on ways they can put leisure back into their schedule.  Education Outcome: Patient did not attend group.  Clinical Observations/Feedback: Patient did not attend group.  Jacquelynn Cree, LRT/CTRS 06/08/2015 3:49 PM

## 2015-06-08 NOTE — Plan of Care (Signed)
Problem: Alteration in mood Goal: LTG-Patient reports reduction in suicidal thoughts (Patient reports reduction in suicidal thoughts and is able to verbalize a safety plan for whenever patient is feeling suicidal)  Outcome: Progressing Patient denies SI/HI.      

## 2015-06-08 NOTE — Progress Notes (Signed)
D:  Patient presents with flat affect.  Rates depression as 8/10.  Denies SI at this time.  Verbalizes that at time she looses her voice.  Speech is soft and slow.  Patient requires one person assist when getting up.  Up to dayroom earlier and was sitting in wheelchair with head down and would not respond to anyone.  Wheeled back to room.  Pupils equal round and reactive to light.  Vital signs obtained.  BP-101/64, pulse-72, respirations 20.  Assisted patient to bed.  Patient states "I feel dizzy"  Instructed not to get out of bed without calling for assistance.  Verbalized understanding.   A:  Support and encouragement offered.  Safety maintained.  Wheelchair provided.   R.  Responsive to treatment plan.

## 2015-06-08 NOTE — Plan of Care (Signed)
Problem: Alteration in mood Goal: LTG-Pt's behavior demonstrates decreased signs of depression (Patient's behavior demonstrates decreased signs of depression to the point the patient is safe to return home and continue treatment in an outpatient setting)  Outcome: Not Progressing Presents with sad flat affect. Rates depression as a 8/10

## 2015-06-08 NOTE — Progress Notes (Signed)
Recreation Therapy Notes  LRT spoke with patient's nurse regarding patient's assessment. Patient's nurse reported LRT should wait a day due to patient's behavior.  Jacquelynn Cree, LRT/CTRS 06/08/2015 4:45 PM

## 2015-06-08 NOTE — H&P (Addendum)
Psychiatric Admission Assessment Adult  Patient Identification: Danielle [name] MRN:  [id number] Date of Evaluation:  06/08/2015 Chief Complaint:  Schizophrenia Principal Diagnosis: Paranoid schizophrenia (HCC) Diagnosis:   Patient Active Problem List   Diagnosis Date Noted  . Tobacco use disorder [F17.200] 06/08/2015  . Paranoid schizophrenia (HCC) [F20.0]   . Migraine headache [G43.909] 06/04/2012  . Seizure (HCC) [R56.9] 06/04/2012  . Hypothyroidism [E03.9] 06/04/2012  . OSA (obstructive sleep apnea) [G47.33] 08/29/2011  . Post traumatic stress disorder (PTSD) [F43.10] 07/31/2011   History of Present Illness:  Identifying data. [name] is a 45 year old female with a history of paranoid schizophrenia.  Chief complaint. "I will fall in the most peculiar circumstances. It is embarrassing."  History of present illness. Information was obtained from the patient and the chart. The patient has a long history of schizophrenia. She has been in the care of Dr. Lolly Mustache for several years now and relatively stable on a combination of Haldol, Depakote, and trazodone. Recently the patient started complaining of increased paranoia venting her from leaving the house and auditory hallucinations. Dr. Lolly Mustache plan to taper the Haldol of and switch the patient to Abilify however the patient could not get her Abilify as of yet due to prior approval process. She became increasingly paranoid, depressed, hallucinating, and thinking of hurting herself. She admits that she does not want to die but has been very frustrated with no access to necessary medications and deterioration of her general health. For extended periods of time she has been unable to walk and has been falling frequently. She describes it as episodes of dizziness. She has been seen by neurologist rule out seizures. She is now under investigation children by cardiologist with a plan to do Holter monitoring next week. The patient cannot function very  well at home. Apparently she does not use a walker but walks along the walls. She's been falling frequently with head bumps and cuts. She is very frustrated because falls happen randomly in most embarrassing circumstances. She has been trying to hurt herself by hitting herself. As she is sitting in the wheelchair she started hitting her legs. She also experiences periods of confusion. She noticed difficulties with her speech. Indeed her speech is slurred. She reports many symptoms of depression with poor sleep, decreased appetite, anhedonia, feeling of guilt and hopelessness worthlessness, poor energy and concentration, social isolation, crying spells and passive suicidal ideation. She reports increased anxiety symptoms of PTSD type. She experiences more hallucinations and paranoia since Haldol dose was lowered. She denies any alcohol or illicit substance use.  Past psychiatric history. She was diagnosed with schizophrenia many years ago. She was in the hospital 3 or 4 times before always after suicide attempt. She had attempted suicide by overdose and drowning. She has been tried on multiple medications including Risperdal, Vega, Haldol, Zoloft, Wellbutrin, amitriptyline, trazodone. She is not historian and did not recognize the name of Risperdal thinking that her son was treated with Risperdal. In review of her records it is clear that she has been on Risperdal for extensive periods of time.   Family psychiatric history. She was adopted. She has a son with developmental disability.  Social history. She has a high school diploma. [place] The family relocated to [place] after her father passed away and they no longer afford to live in [place]. She lives with her husband and her son. Apparently she does not have disability. She has private  insurance. She does not qualify for Medicaid.  Total Time spent with patient: 1 hour  Past Psychiatric  History: Schizophrenia.  Risk to Self: Is patient at risk for suicide?: No Risk to Others:   Prior Inpatient Therapy:   Prior Outpatient Therapy:    Alcohol Screening:   Substance Abuse History in the last 12 months:  No. Consequences of Substance Abuse: NA Previous Psychotropic Medications: Yes  Psychological Evaluations: No  Past Medical History:  Past Medical History  Diagnosis Date  . Depressed   . Seizures (HCC)   . Heart murmur   . Hypothyroidism   . Anemia   . Headache(784.0)   . Anxiety   . PTSD (post-traumatic stress disorder)   . Shortness of breath   . Schizophrenia (HCC)   . Bipolar 1 disorder [facility])     Past Surgical History  Procedure Laterality Date  . Tubal ligation  1996  . Foot surgery     Family History:  Family History  Problem Relation Age of Onset  . Adopted: Yes   Family Psychiatric  History: Son with developmental disability. Tobacco Screening: [phone])::1)@ Social History:  History  Alcohol Use No     History  Drug Use No    Additional Social History:                           Allergies:   Allergies  Allergen Reactions  . Tomato Rash    Pt reports that she breaks out in a rash if she eats tomato based foods but can eat a whole tomato  . Imitrex [Sumatriptan Base] Hives  . Mustard Seed Rash    Also allergic to [employer]:  Results for orders placed or performed during the hospital encounter of 06/07/15 (from the past 48 hour(s))  Lipid panel     Status: Abnormal   Collection Time: 06/08/15  8:14 AM  Result Value Ref Range   Cholesterol 218 (H) 0 - 200 mg/dL   Triglycerides 161 (H) <150 mg/dL   HDL 36 (L) >09 mg/dL   Total CHOL/HDL Ratio 6.1 RATIO   VLDL 43 (H) 0 - 40 mg/dL   LDL Cholesterol 604 (H) 0 - 99 mg/dL    Comment:        Total Cholesterol/HDL:CHD Risk Coronary Heart Disease Risk Table                     Men   Women  1/2 Average Risk   3.4   3.3  Average Risk       5.0   4.4  2 X  Average Risk   9.6   7.1  3 X Average Risk  23.4   11.0        Use the calculated Patient Ratio above and the CHD Risk Table to determine the patient's CHD Risk.        ATP III CLASSIFICATION (LDL):  <100     mg/dL   Optimal  540-981  mg/dL   Near or Above                    Optimal  130-159  mg/dL   Borderline  191-478  mg/dL   High  >295     mg/dL   Very High   TSH     Status: None   Collection Time: 06/08/15  8:15 AM  Result Value Ref Range   TSH  1.782 0.350 - 4.500 uIU/mL  Valproic acid level     Status: None   Collection Time: 06/08/15  8:15 AM  Result Value Ref Range   Valproic Acid Lvl 62 50.0 - 100.0 ug/mL    Metabolic Disorder Labs:  Lab Results  Component Value Date   HGBA1C 5.9* 02/05/2012   MPG 123* 02/05/2012   MPG 114 09/22/2011   No results found for: PROLACTIN Lab Results  Component Value Date   CHOL 218* 06/08/2015   TRIG 215* 06/08/2015   HDL 36* 06/08/2015   CHOLHDL 6.1 06/08/2015   VLDL 43* 06/08/2015   LDLCALC 139* 06/08/2015   LDLCALC UNABLE TO CALCULATE IF TRIGLYCERIDE OVER 400 mg/dL 81/19/1478    Current Medications: Current Facility-Administered Medications  Medication Dose Route Frequency Provider Last Rate Last Dose  . ARIPiprazole (ABILIFY) tablet 10 mg  10 mg Oral Daily Jimmy Footman, MD   10 mg at 06/08/15 1033  . aspirin EC tablet 81 mg  81 mg Oral Q breakfast Jimmy Footman, MD   81 mg at 06/08/15 1038  . divalproex (DEPAKOTE ER) 24 hr tablet 500 mg  500 mg Oral BID Jimmy Footman, MD   500 mg at 06/08/15 1033  . ibuprofen (ADVIL,MOTRIN) tablet 600 mg  600 mg Oral Q8H PRN Jimmy Footman, MD      . levothyroxine (SYNTHROID, LEVOTHROID) tablet 75 mcg  75 mcg Oral QAC breakfast Jimmy Footman, MD   75 mcg at 06/08/15 1033  . magnesium hydroxide (MILK OF MAGNESIA) suspension 30 mL  30 mL Oral Daily PRN Jimmy Footman, MD      . nicotine (NICODERM CQ - dosed in mg/24  hours) patch 21 mg  21 mg Transdermal Daily PRN Jimmy Footman, MD      . Melene Muller ON 06/09/2015] topiramate (TOPAMAX) tablet 100 mg  100 mg Oral QHS Baldomero Mirarchi B Nyeli Holtmeyer, MD      . traZODone (DESYREL) tablet 100 mg  100 mg Oral QHS Jimmy Footman, MD       PTA Medications: Prescriptions prior to admission  Medication Sig Dispense Refill Last Dose  . aripiprazole (ABILIFY) 10 MG disintegrating tablet Take 1 tablet (10 mg total) by mouth daily. 30 tablet 0 prior auth  . aspirin EC 81 MG tablet Take 81 mg by mouth daily with breakfast.   06/07/2015 at Unknown time  . benztropine (COGENTIN) 0.5 MG tablet Take 1 tablet (0.5 mg total) by mouth 2 (two) times daily. 60 tablet 0 06/07/2015 at Unknown time  . divalproex (DEPAKOTE ER) 500 MG 24 hr tablet Take 1 tablet (500 mg total) by mouth 2 (two) times daily. 180 tablet 0 06/07/2015 at Unknown time  . haloperidol (HALDOL) 5 MG tablet Take 5 mg by mouth 2 (two) times daily.  0 06/07/2015 at Unknown time  . ibuprofen (ADVIL,MOTRIN) 800 MG tablet Take 800 mg by mouth every 8 (eight) hours as needed for headache or moderate pain.   0 Past Week at Unknown time  . levothyroxine (SYNTHROID, LEVOTHROID) 75 MCG tablet Take 75 mcg by mouth daily before breakfast.   5 06/07/2015 at Unknown time  . topiramate (TOPAMAX) 100 MG tablet Take 100 mg by mouth 2 (two) times daily.   06/07/2015 at Unknown time  . traZODone (DESYREL) 100 MG tablet Take 1 tablet (100 mg total) by mouth at bedtime. 90 tablet 0 06/06/2015 at Unknown time    Musculoskeletal: Strength & Muscle Tone: decreased Gait & Station: unsteady Patient leans: N/A  Psychiatric Specialty Exam:  Physical exam performed in the emergency room and agree with the findings. Physical Exam  Nursing note and vitals reviewed.   Review of Systems  Musculoskeletal: Positive for falls.  Neurological: Positive for headaches.    Blood pressure 101/64, pulse 72, temperature 97.8 F (36.6 C), temperature  source Oral, resp. rate 20, height 5\' 11"  (1.803 m), weight 94.348 kg (208 lb), SpO2 96 %.Body mass index is 29.02 kg/(m^2).  See SRA.                                                  Sleep:  Number of Hours: 5.75     Treatment Plan Summary: Daily contact with patient to assess and evaluate symptoms and progress in treatment and Medication management   [name] is a 45 year old female with a history of paranoid schizophrenia admitted for suicidal ideation and self-injurious behavior in the context of physical illness.  1. Suicidal ideation. The patient assures me that she does not want to die, yet she is so frustrated With frequent falls and inability to walk that she is considering hurting herself. She's been repeatedly hitting her legs.   2. Psychosis. The patient has been maintained on Haldol for several years now. Apparently it stopped working and the patient started experiencing auditory hallucinations and paranoia again. Dr. Lolly Mustache, her primary psychiatrist attempted to switch her to Abilify. At the patient did not have access to Abilify as of yet as it needed prior approval. She believes that it may be improved by mouth. We will start Abilify and check with her pharmacy for the availability. There is an option of giving her Abilify maintenance injection. The patient has also been on Depakote. VPA level is 62 today. We will check ammonia level as it could be responsible for slurred speech, confusion, and falls.   3. Insomnia. We will continue trazodone.  4. Hypothyroidism. We'll continue Synthroid.  5. Smoking. She is only content patch.  6. Metabolic syndrome. Lipid panel is elevated. TSH is normal. Hemoglobin A1c and prolactin are pending. We will also check vitamin B12 and folate acid.  7. Falls. She was seen by a neurologist in the community and seizure disorder was ruled out. She is awaiting Holter monitoring in the community. We are checking ammonia level.  If it's normal will ask cardiology for a consult to see if we can do Holter monitor while in the hospital.   8.  Headaches. She is on Topamax.   9. Disposition. She will be discharged to home with her family. She will follow up with Dr. Lolly Mustache.   Observation Level/Precautions:  15 minute checks  Laboratory:  CBC Chemistry Profile UDS UA  Psychotherapy:    Medications:    Consultations:    Discharge Concerns:    Estimated LOS:  Other:     I certify that inpatient services furnished can reasonably be expected to improve the patient's condition.    Kristine Linea, MD 2/2/201711:55 AM

## 2015-06-08 NOTE — Progress Notes (Signed)
  Admission Note:  45 yr female who presents [facility] in acute distress for the treatment of AVH and Depression. Nurse was called to the sally port before patient could make it to the unit because patient had an asisted fall. Upon examination no injuries noted full ROME x 4 PERRLA no head injury. The security officer stated "that the patient slid out of the wheel chair and she was lowered down". Pt appears flat and depressed. Pt was noted responding to internal stimuli, patient was speaking in different voice tones sometimes high and sometimes  inaudible. Patient was not cooperative with admission process. Patient denies SI/HI upon admission. Skin was assessed and found to be clear of any abnormal marks apart from some redness under bilateral breast.  PT searched and no contraband found, POC and unit policies explained to patient and understanding verbalized. Patient refused to sign the consent form. Food and fluids offered, and fluids accepted. Pt had no additional questions or concerns.15 minutes checks maintained,

## 2015-06-08 NOTE — [facility] Suicide Risk Assessment (Signed)
[facility] Admission Suicide Risk Assessment   Nursing information obtained from:    Demographic factors:    Current Mental Status:    Loss Factors:    Historical Factors:    Risk Reduction Factors:     Total Time spent with patient: 1 hour Principal Problem: Paranoid schizophrenia (HCC) Diagnosis:   Patient Active Problem List   Diagnosis Date Noted  . Tobacco use disorder [F17.200] 06/08/2015  . Paranoid schizophrenia (HCC) [F20.0]   . Migraine headache [G43.909] 06/04/2012  . Seizure (HCC) [R56.9] 06/04/2012  . Hypothyroidism [E03.9] 06/04/2012  . OSA (obstructive sleep apnea) [G47.33] 08/29/2011  . Post traumatic stress disorder (PTSD) [F43.10] 07/31/2011   Subjective Data: Depression, hallucinations, suicidal ideation, self-injurious behaviors, falls.  Continued Clinical Symptoms:    The "Alcohol Use Disorders Identification Test", Guidelines for Use in Primary Care, Second Edition.  World Science writer [facility]). Score between 0-7:  no or low risk or alcohol related problems. Score between 8-15:  moderate risk of alcohol related problems. Score between 16-19:  high risk of alcohol related problems. Score 20 or above:  warrants further diagnostic evaluation for alcohol dependence and treatment.   CLINICAL FACTORS:   Severe Anxiety and/or Agitation Schizophrenia:   Paranoid or undifferentiated type Currently Psychotic   Musculoskeletal: Strength & Muscle Tone: decreased Gait & Station: unsteady Patient leans: N/A  Psychiatric Specialty Exam: Review of Systems  Musculoskeletal: Positive for falls.  Neurological: Positive for headaches.  Psychiatric/Behavioral: Positive for depression, suicidal ideas and hallucinations.  All other systems reviewed and are negative.   Blood pressure 101/64, pulse 72, temperature 97.8 F (36.6 C), temperature source Oral, resp. rate 20, height  (1.803 m), weight 94.348 kg (208 lb), SpO2 96 %.Body mass index is 29.02 kg/(m^2).   General Appearance: Disheveled  Eye Solicitor::  Fair  Speech:  Slurred  Volume:  Normal  Mood:  Dysphoric  Affect:  Tearful  Thought Process:  Goal Directed  Orientation:  Full (Time, Place, and Person)  Thought Content:  Delusions, Hallucinations: Auditory and Paranoid Ideation  Suicidal Thoughts:  Yes.  with intent/plan  Homicidal Thoughts:  No  Memory:  Immediate;   Fair Recent;   Fair Remote;   Fair  Judgement:  Fair  Insight:  Present  Psychomotor Activity:  Decreased  Concentration:  Fair  Recall:  [employer] of Knowledge:Fair  Language: Fair  Akathisia:  No  Handed:  Right  AIMS (if indicated):     Assets:  Communication Skills Desire for Improvement Financial Resources/Insurance Housing Intimacy Resilience Social Support  Sleep:  Number of Hours: 5.75  Cognition: WNL  ADL's:  Intact    COGNITIVE FEATURES THAT CONTRIBUTE TO RISK:  None    SUICIDE RISK:   Moderate:  Frequent suicidal ideation with limited intensity, and duration, some specificity in terms of plans, no associated intent, good self-control, limited dysphoria/symptomatology, some risk factors present, and identifiable protective factors, including available and accessible social support.  PLAN OF CARE: Hospital admission, medication management, discharge planning.  [name] is a 45 year old female with a history of paranoid schizophrenia admitted for suicidal ideation and self-injurious behavior in the context of physical illness.  1. Suicidal ideation. The patient assures me that she does not want to die, yet she is so frustrated With frequent falls and inability to walk that she is considering hurting herself. She's been repeatedly hitting her legs.   2. Psychosis. The patient has been maintained on Haldol for several years now. Apparently it stopped working  and the patient started experiencing auditory hallucinations and paranoia again. Dr. Lolly Mustache, her primary psychiatrist attempted to switch  her to Abilify. At the patient did not have access to Abilify as of yet as it needed prior approval. She believes that it may be improved by mouth. We will start Abilify and check with her pharmacy for the availability. There is an option of giving her Abilify maintenance injection. The patient has also been on Depakote. VPA level is 62 today. We will check ammonia level as it could be responsible for slurred speech, confusion, and falls.   3. Insomnia. We will continue trazodone.  4. Hypothyroidism. We'll continue Synthroid.  5. Smoking. She is only content patch.  6. Metabolic syndrome. Lipid panel is elevated. TSH is normal. Hemoglobin A1c and prolactin are pending.   7. Falls. She was seen by a neurologist in the community and seizure disorder was ruled out. She is awaiting Holter monitoring in the community. We are checking ammonia level. If it's normal will ask cardiology for a consult to see if we can do Holter monitor while in the hospital.   8. Disposition. She will be discharged to home with her family. She will follow up with Dr. Lolly Mustache.  I certify that inpatient services furnished can reasonably be expected to improve the patient's condition.   Kristine Linea, MD 06/08/2015, 11:43 AM

## 2015-06-08 NOTE — [facility] Group Notes (Signed)
[facility] LCSW Group Therapy  06/08/2015 3:50 PM  Type of Therapy:  Group Therapy  Participation Level:  Did Not Attend   Modes of Intervention:  Discussion, Education, Socialization and Support  Summary of Progress/Problems: Balance in life: Patients will discuss the concept of balance and how it looks and feels to be unbalanced. Pt will identify areas in their life that is unbalanced and ways to become more balanced.    Danielle [name] L Jacklin Zwick MSW, LCSWA  06/08/2015, 3:50 PM   

## 2015-06-09 ENCOUNTER — Telehealth (HOSPITAL_COMMUNITY): Payer: Self-pay

## 2015-06-09 LAB — URINALYSIS COMPLETE WITH MICROSCOPIC (ARMC ONLY)
BILIRUBIN URINE: NEGATIVE
Bacteria, UA: NONE SEEN
Glucose, UA: NEGATIVE mg/dL
KETONES UR: NEGATIVE mg/dL
NITRITE: NEGATIVE
PH: 6 (ref 5.0–8.0)
Protein, ur: NEGATIVE mg/dL
SPECIFIC GRAVITY, URINE: 1.015 (ref 1.005–1.030)

## 2015-06-09 LAB — MAGNESIUM: Magnesium: 1.8 mg/dL (ref 1.7–2.4)

## 2015-06-09 LAB — PROLACTIN: PROLACTIN: 39.1 ng/mL — AB (ref 4.8–23.3)

## 2015-06-09 LAB — AMMONIA: Ammonia: 52 umol/L — ABNORMAL HIGH (ref 9–35)

## 2015-06-09 MED ORDER — ARIPIPRAZOLE 10 MG PO TABS
20.0000 mg | ORAL_TABLET | Freq: Every day | ORAL | Status: DC
  Administered 2015-06-10 – 2015-06-19 (×10): 20 mg via ORAL
  Filled 2015-06-09 (×12): qty 2

## 2015-06-09 MED ORDER — LORAZEPAM 1 MG PO TABS
1.0000 mg | ORAL_TABLET | ORAL | Status: DC | PRN
  Administered 2015-06-09 – 2015-06-16 (×17): 1 mg via ORAL
  Filled 2015-06-09 (×18): qty 1

## 2015-06-09 MED ORDER — LITHIUM CARBONATE 300 MG PO CAPS
300.0000 mg | ORAL_CAPSULE | Freq: Three times a day (TID) | ORAL | Status: DC
  Administered 2015-06-09 – 2015-06-19 (×31): 300 mg via ORAL
  Filled 2015-06-09 (×33): qty 1

## 2015-06-09 NOTE — Progress Notes (Signed)
  I received a call from Dr. Jennet Maduro regarding the patient's recurrent falls for the past 20 years which have increased in frequency. She was admitted for suicidal ideation and says the falls are very frustrating to her and that was the reasoning for her ideations.  She has an appointment with her outpatient Cardiologist next week to get a Holter Monitor. She has been evaluated by Neurology for a possible seizure disorder but no abnormalities were identified.  In speaking with Dr. Jennet Maduro, we reviewed her BMET and CBC which were normal and did not display any electrolyte abnormalities. I recommended obtaining an EKG and checking a Magnesium level. We cannot connect her to telemetry due to her being in the Inpatient Behavioral Medicine Unit.  I discussed the patient with Dr. Mariah Milling who agreed with checking a Mg level and obtaining a BMET. Would also recommend getting an echocardiogram over the weekend or early next week if the patient is still admitted to assess LV function and possible valve abnormalities. Unfortunately, the inpatient echo tech is not here today.  Will revisit the patient's case on Monday and make further recommendations.  Signed, Ellsworth Lennox, PA-C [facility] 06/09/2015, 4:36 PM

## 2015-06-09 NOTE — Progress Notes (Signed)
Recreation Therapy Notes  INPATIENT RECREATION THERAPY ASSESSMENT  Patient Details Name: Danielle [name] MRN: [id number] DOB: 03-05-1971 Today's Date: 06/09/2015  Patient Stressors: Friends, Other (Comment) (Lack of supportive friends; finances)  Coping Skills:   Isolate, Avoidance, Music, Sports, Other (Comment) (3x10 - coping skills she learn in therapy)  Personal Challenges: Anger, Communication, Concentration, Decision-Making, Expressing Yourself, Problem-Solving, Self-Esteem/Confidence, Social Interaction, Stress Management  Leisure Interests (2+):  Individual - TV, Individual - Other (Comment) (Take baths)  Awareness of Community Resources:  No  Community Resources:     Current Use:    If no, Barriers?:    Patient Strengths:  No  Patient Identified Areas of Improvement:  Anxiety, cope with schizophrenia  Current Recreation Participation:  Nothing  Patient Goal for Hospitalization:  To get th where she doens't want to hurt herself and get medicine straight  [place] of Residence:  [place] of Residence:  [place]   Current [place] (including self-harm):  Yes - thoughts of self-harm  Current HI:  No  Consent to Intern Participation: N/A   Danielle [name] 06/09/2015, 5:02 PM

## 2015-06-09 NOTE — Progress Notes (Signed)
Patient affect is brighter today.  Verbalizes that she feels better and that depression is better.  States that she does feel like cutting her legs because that helps her to relieve stress.  Verbal contracts to not do any self harm.  Utilizing wheelchair to move around unit without difficulty.  Safety maintained.  Support and encouragement offered.

## 2015-06-09 NOTE — [facility] Suicide Risk Assessment (Signed)
[facility] INPATIENT:  Family/Significant Other Suicide Prevention Education  Suicide Prevention Education:  Education Completed; Jinelle Butchko has been identified by the patient as the family member/significant other with whom the patient will be residing, and identified as the person(s) who will aid the patient in the event of a mental health crisis (suicidal ideations/suicide attempt).  With written consent from the patient, the family member/significant other has been provided the following suicide prevention education, prior to the and/or following the discharge of the patient.  The suicide prevention education provided includes the following:  Suicide risk factors  Suicide prevention and interventions  National Suicide Hotline telephone number  [facility] assessment telephone number  [facility] Emergency Assistance 911  [facility] and/or Residential Mobile Crisis Unit telephone number  Request made of family/significant other to:  Remove weapons (e.g., guns, rifles, knives), all items previously/currently identified as safety concern.    Remove drugs/medications (over-the-counter, prescriptions, illicit drugs), all items previously/currently identified as a safety concern.  The family member/significant other verbalizes understanding of the suicide prevention education information provided.  The family member/significant other agrees to remove the items of safety concern listed above.  [employer] MSW, LCSWA  06/09/2015, 4:15 PM

## 2015-06-09 NOTE — [facility] Group Notes (Signed)
[facility] LCSW Group Therapy  06/09/2015 2:47 PM  Type of Therapy:  Group Therapy  Participation Level:  Did Not Attend  Modes of Intervention:  Discussion, Education, Socialization and Support  Summary of Progress/Problems: Feelings around Relapse. Group members discussed the meaning of relapse and shared personal stories of relapse, how it affected them and others, and how they perceived themselves during this time. Group members were encouraged to identify triggers, warning signs and coping skills used when facing the possibility of relapse. Social supports were discussed and explored in detail.   Carmie Lanpher L Sorcha Rotunno  MSW, LCSWA   06/09/2015, 2:47 PM  

## 2015-06-09 NOTE — Progress Notes (Signed)
[facility] MD Progress Note  06/09/2015 4:04 PM Danielle [name]  MRN:  [id number]  Subjective:  Danielle [name] has a history of paranoid schizophrenia, possibly schizoaffective disorder. She was admitted for suicidal ideation in the context of recent medication adjustments. She also has a history of frequent falls that she finds very embarrassing and wants to kill herself over it. Danielle [name] discontinued Haldol and started her on Abilify recently but the patient had no access to Abilify due to prior approval process. She came to the hospital confused, with slurred speech, and difficulties walking. She has been on Depakote. Her ammonia was elevated. We discontinued Depakote and started lithium. She has a long history of falls possibly for 20 years. She has been using a scooter and an walker at home. Some of her falls resulted in head injuries and as well as broken bones. I asked Danielle [name], cardiology for consultation.  The patient today is very tearful. She complains of a panic attack lasting for several hours. She is still depressed, anxious, suicidal. She moves about in a wheelchair. She participates in programming. Sleep and appetite are fair.   Principal Problem: Paranoid schizophrenia (HCC) Diagnosis:   Patient Active Problem List   Diagnosis Date Noted  . Tobacco use disorder [F17.200] 06/08/2015  . Hyperammonemia (HCC) [E72.20] 06/08/2015  . Paranoid schizophrenia (HCC) [F20.0]   . Migraine headache [G43.909] 06/04/2012  . Seizure (HCC) [R56.9] 06/04/2012  . Hypothyroidism [E03.9] 06/04/2012  . OSA (obstructive sleep apnea) [G47.33] 08/29/2011  . Post traumatic stress disorder (PTSD) [F43.10] 07/31/2011   Total Time spent with patient: 30 minutes  Past Psychiatric History: Schizophrenia.   Past Medical History:  Past Medical History  Diagnosis Date  . Depressed   . Seizures (HCC)   . Heart murmur   . Hypothyroidism   . Anemia   . Headache(784.0)   . Anxiety   . PTSD (post-traumatic stress  disorder)   . Shortness of breath   . Schizophrenia (HCC)   . Bipolar 1 disorder [facility])     Past Surgical History  Procedure Laterality Date  . Tubal ligation  1996  . Foot surgery     Family History:  Family History  Problem Relation Age of Onset  . Adopted: Yes   Family Psychiatric  History: See H&P.   Social History:  History  Alcohol Use No     History  Drug Use No    Social History   Social History  . Marital Status: Married    Spouse Name: N/A  . Number of Children: 2  . Years of Education: N/A   Occupational History  . unemployed    Social History Main Topics  . Smoking status: Former Smoker -- 1.50 packs/day for 30 years    Types: Cigarettes    Quit date: 09/20/2011  . Smokeless tobacco: Never Used  . Alcohol Use: No  . Drug Use: No  . Sexual Activity: Yes    Birth Control/ Protection: Surgical   Other Topics Concern  . None   Social History Narrative   Pt is adopted. Unsure of family history.   Additional Social History:                         Sleep: Fair  Appetite:  Fair  Current Medications: Current Facility-Administered Medications  Medication Dose Route Frequency Provider Last Rate Last Dose  . [START ON 06/10/2015] ARIPiprazole (ABILIFY) tablet 20 mg  20 mg Oral Daily Barnaby Rippeon B  Foster Sonnier, MD      . aspirin EC tablet 81 mg  81 mg Oral Q breakfast Jimmy Footman, MD   81 mg at 06/09/15 1610  . cyanocobalamin ((VITAMIN B-12)) injection 1,000 mcg  1,000 mcg Intramuscular Q0600 Shari Prows, MD   1,000 mcg at 06/09/15 0650  . [START ON 06/11/2015] cyanocobalamin tablet 1,000 mcg  1,000 mcg Oral Daily Azlee Monforte B Stephaun Million, MD      . folic acid (FOLVITE) tablet 1 mg  1 mg Oral Daily Nikayla Madaris B Evoleht Hovatter, MD   1 mg at 06/09/15 0922  . ibuprofen (ADVIL,MOTRIN) tablet 600 mg  600 mg Oral Q8H PRN Jimmy Footman, MD      . lactulose (CHRONULAC) 10 GM/15ML solution 30 g  30 g Oral BID Shari Prows, MD    30 g at 06/09/15 0921  . levothyroxine (SYNTHROID, LEVOTHROID) tablet 75 mcg  75 mcg Oral QAC breakfast Jimmy Footman, MD   75 mcg at 06/08/15 1033  . lithium carbonate capsule 300 mg  300 mg Oral TID WC Marta Bouie B Cella Cappello, MD      . LORazepam (ATIVAN) tablet 1 mg  1 mg Oral Q4H PRN Shamaya Kauer B Derrin Currey, MD      . magnesium hydroxide (MILK OF MAGNESIA) suspension 30 mL  30 mL Oral Daily PRN Jimmy Footman, MD      . nicotine (NICODERM CQ - dosed in mg/24 hours) patch 21 mg  21 mg Transdermal Daily PRN Jimmy Footman, MD      . topiramate (TOPAMAX) tablet 100 mg  100 mg Oral QHS Suriya Kovarik B Arnetha Silverthorne, MD      . traZODone (DESYREL) tablet 100 mg  100 mg Oral QHS Jimmy Footman, MD   100 mg at 06/08/15 2251    Lab Results:  Results for orders placed or performed during the hospital encounter of 06/07/15 (from the past 48 hour(s))  Lipid panel     Status: Abnormal   Collection Time: 06/08/15  8:14 AM  Result Value Ref Range   Cholesterol 218 (H) 0 - 200 mg/dL   Triglycerides 960 (H) <150 mg/dL   HDL 36 (L) >45 mg/dL   Total CHOL/HDL Ratio 6.1 RATIO   VLDL 43 (H) 0 - 40 mg/dL   LDL Cholesterol 409 (H) 0 - 99 mg/dL    Comment:        Total Cholesterol/HDL:CHD Risk Coronary Heart Disease Risk Table                     Men   Women  1/2 Average Risk   3.4   3.3  Average Risk       5.0   4.4  2 X Average Risk   9.6   7.1  3 X Average Risk  23.4   11.0        Use the calculated Patient Ratio above and the CHD Risk Table to determine the patient's CHD Risk.        ATP III CLASSIFICATION (LDL):  <100     mg/dL   Optimal  811-914  mg/dL   Near or Above                    Optimal  130-159  mg/dL   Borderline  782-956  mg/dL   High  >213     mg/dL   Very High   Hemoglobin A1c     Status: None   Collection Time: 06/08/15  8:14 AM  Result Value Ref Range   Hgb A1c MFr Bld 5.5 4.0 - 6.0 %  Prolactin     Status: Abnormal   Collection Time:  06/08/15  8:14 AM  Result Value Ref Range   Prolactin 39.1 (H) 4.8 - 23.3 ng/mL    Comment: (NOTE) Performed At: [facility] [address] [place], [place] [id number] Danielle Homer MD WU:[id number]   TSH     Status: None   Collection Time: 06/08/15  8:15 AM  Result Value Ref Range   TSH 1.782 0.350 - 4.500 uIU/mL  Valproic acid level     Status: None   Collection Time: 06/08/15  8:15 AM  Result Value Ref Range   Valproic Acid Lvl 62 50.0 - 100.0 ug/mL  Vitamin B12     Status: None   Collection Time: 06/08/15  8:15 AM  Result Value Ref Range   Vitamin B-12 246 180 - 914 pg/mL    Comment: (NOTE) This assay is not validated for testing neonatal or myeloproliferative syndrome specimens for Vitamin B12 levels. Performed at [facility]   Ammonia     Status: Abnormal   Collection Time: 06/08/15 12:57 PM  Result Value Ref Range   Ammonia 69 (H) 9 - 35 umol/L  Folate     Status: Abnormal   Collection Time: 06/08/15 12:57 PM  Result Value Ref Range   Folate 5.5 (L) >5.9 ng/mL  Ammonia     Status: Abnormal   Collection Time: 06/09/15  2:23 PM  Result Value Ref Range   Ammonia 52 (H) 9 - 35 umol/L    Physical Findings: AIMS:  , ,  ,  ,    CIWA:    COWS:     Musculoskeletal: Strength & Muscle Tone: within normal limits Gait & Station: unsteady Patient leans: N/A  Psychiatric Specialty Exam: Review of Systems  Constitutional: Positive for malaise/fatigue.  Musculoskeletal: Positive for falls.  Psychiatric/Behavioral: Positive for depression and suicidal ideas. The patient is nervous/anxious.   All other systems reviewed and are negative.   Blood pressure 89/54, pulse 65, temperature 97.8 F (36.6 C), temperature source Oral, resp. rate 18, height 5\' 11"  (1.803 m), weight 94.348 kg (208 lb), SpO2 96 %.Body mass index is 29.02 kg/(m^2).  General Appearance: Disheveled  Eye Solicitor::  Fair  Speech:  Slurred  Volume:  Decreased  Mood:  Anxious,  Depressed and Hopeless  Affect:  Labile and Tearful  Thought Process:  Goal Directed  Orientation:  Full (Time, Place, and Person)  Thought Content:  Delusions and Paranoid Ideation  Suicidal Thoughts:  Yes.  with intent/plan  Homicidal Thoughts:  No  Memory:  Immediate;   Fair Recent;   Fair Remote;   Fair  Judgement:  Fair  Insight:  Shallow  Psychomotor Activity:  Decreased  Concentration:  Fair  Recall:  [employer] of Knowledge:Fair  Language: Fair  Akathisia:  No  Handed:  Right  AIMS (if indicated):     Assets:  Communication Skills Desire for Improvement Financial Resources/Insurance Housing Resilience Social Support  ADL's:  Intact  Cognition: WNL  Sleep:  Number of Hours: 6.15   Treatment Plan Summary: Daily contact with patient to assess and evaluate symptoms and progress in treatment and Medication management   [name] is a 45 year old female with a history of paranoid schizophrenia admitted for suicidal ideation and self-injurious behavior in the context of physical illness.  1. Suicidal ideation. The patient assures me that she does not  want to die, yet she is so frustrated with frequent falls and inability to walk that she is considering hurting herself. She's been repeatedly hitting her legs.   2. Psychosis. The patient has been maintained on Haldol for several years now. Apparently it stopped working and the patient started experiencing auditory hallucinations and paranoia again. Dr. Lolly Mustache, her primary psychiatrist attempted to switch her to Abilify. At the patient did not have access to Abilify as of yet as it needed prior approval. She believes that it may be improved by mouth. We will start Abilify and check with her pharmacy for the availability. There is an option of giving her Abilify maintenance injection. The patient has also been on Depakote. VPA level is 62 today, ammonia 69 yesterday, 52 today. Depakote was discontinued. She is on lactulose. I will  increase Abilify and start Lithium.  3. Insomnia. We will continue trazodone.  4. Hypothyroidism. We'll continue Synthroid.  5. Smoking. She is on nicotine patch.  6. Metabolic syndrome. Lipid panel is elevated. Hemoglobin A1c TSH are normal. Prolactin is pending.   7. Low vitamin B12 and folate. We started both.   8. Falls. She was seen by a neurologist in the community and seizure disorder was ruled out. She is awaiting Holter monitoring in the community. Cardiology consult is greatly appreciated. I ordered EKG and Mg.    8. Headaches. She is on Topamax. Husband reports that she started falling when Topamax was increased from 100 to 200 mg. It is unlikely that she had Topamax for 20 years.  9. Disposition. She will be discharged to home with her family. She will follow up with Dr. Lolly Mustache.   Observation Level/Precautions: 15 minute checks  Laboratory: CBC Chemistry Profile UDS UA  Psychotherapy:   Medications:   Consultations:   Discharge Concerns:   Estimated LOS:  Other:    I certify that inpatient services furnished can reasonably be expected to improve the patient's condition.   Kristine Linea, MD 2/2/201711:55 AM       Revision History     Date/Time User Provider Type Action   06/08/2015 3:12 PM Shari Prows, MD Psychiatrist Addend   06/08/2015 12:11 PM Shari Prows, MD Psychiatrist Sign   View Details Report          Kristine Linea, MD 06/09/2015, 4:04 PM

## 2015-06-09 NOTE — Plan of Care (Signed)
Problem: Alteration in mood Goal: LTG-Patient reports reduction in suicidal thoughts (Patient reports reduction in suicidal thoughts and is able to verbalize a safety plan for whenever patient is feeling suicidal)  Outcome: Progressing Patient denies SI/HI.      

## 2015-06-09 NOTE — Progress Notes (Signed)
Physical Therapy Evaluation Patient Details Name: Danielle [name] MRN: [id number] DOB: 1971/04/16 Today's Date: 06/09/2015   History of Present Illness  [name] is a 45 year old female with a history of paranoid schizophrenia admitted for suicidal ideation and self-injurious behavior in the context of physical illness.  Clinical Impression  Pt presents with lethargy, generalized weakness, and R LE extremity pain/weakness affecting her gait and balance.  Pt has a history of R LE trauma from a fall for which she had surgical intervention and rehab, although unclear of how Danielle [name] ago she sustained her injury.  Pt reports pain in her R leg with active movement and has noticeable weakness especially in her hip abductors when not formally assessing strength.  With strength testing pt was more inconsistent with her strength in DF/PF and knee ex/flex.  Due to pt's recent lack of activity due to her mental illness pt has generalized weakness and decreased balance.  Pt would benefit form acute PT services to address objective findings and f/u services with HHPT after discharge.    Follow Up Recommendations Home health PT    Equipment Recommendations  Rolling walker with 5" wheels    Recommendations for Other Services       Precautions / Restrictions Precautions Precautions: Fall Precaution Comments: High Restrictions Weight Bearing Restrictions: No      Mobility  Bed Mobility Overal bed mobility: Modified Independent                Transfers Overall transfer level: Independent Equipment used: Rolling walker (2 wheeled)             General transfer comment: Slow to rise from seated, pushing up on RW with B UE's  Ambulation/Gait Ambulation/Gait assistance: Supervision Ambulation Distance (Feet): 60 Feet Assistive device: Rolling walker (2 wheeled) Gait Pattern/deviations: Step-through pattern     General Gait Details: Slow gait with heavy lean on RW through UE's; grossly  equal step length bilaterally with no frank balance deviations; one episode of non-responsiveness (blank stare) while walking, pt responde to nurse telling pt to keep walking.  Stairs            Wheelchair Mobility    Modified Rankin (Stroke Patients Only)       Balance Overall balance assessment: Needs assistance Sitting-balance support: Feet supported Sitting balance-Leahy Scale: Good     Standing balance support: Bilateral upper extremity supported Standing balance-Leahy Scale: Good                               Pertinent Vitals/Pain Pain Assessment: 0-10 Pain Location: R upper leg/thigh Pain Descriptors / Indicators: Guarding;Aching Pain Intervention(s): Limited activity within patient's tolerance;Monitored during session    Home Living Family/patient expects to be discharged to:: Private residence Living Arrangements: Spouse/significant other   Type of Home: House Home Access: Stairs to enter     Home Layout: One level Home Equipment: Environmental consultant - 2 wheels Additional Comments: Husband works during the day, so pt alone.    Prior Function Level of Independence: Independent         Comments: Pt reports spending most of her day in bed.     Hand Dominance        Extremity/Trunk Assessment               Lower Extremity Assessment: Generalized weakness;RLE deficits/detail RLE Deficits / Details: decreased stregth in hip abductors and ankle DF's noted; knee strength for flexion and  extension inconsistent       Communication   Communication: No difficulties  Cognition Arousal/Alertness: Awake/alert Behavior During Therapy: Flat affect (episode of "freezing" while walking) Overall Cognitive Status: History of cognitive impairments - at baseline                      General Comments General comments (skin integrity, edema, etc.): visible areas c/d/i    Exercises Other Exercises Other Exercises: ABD/ADD x 5 reps; bilaterally  bridging x 5 reps; heel slides      Assessment/Plan    PT Assessment Patient needs continued PT services  PT Diagnosis Difficulty walking;Generalized weakness   PT Problem List    PT Treatment Interventions DME instruction;Gait training;Functional mobility training;Therapeutic activities;Therapeutic exercise   PT Goals (Current goals can be found in the Care Plan section) Acute Rehab PT Goals Patient Stated Goal: None stated. PT Goal Formulation: With patient Time For Goal Achievement: 06/23/15 Potential to Achieve Goals: Fair    Frequency Min 2X/week   Barriers to discharge        Co-evaluation               End of Session Equipment Utilized During Treatment: Gait belt Activity Tolerance: Patient limited by fatigue;Patient limited by pain Patient left: in bed Nurse Communication: Mobility status         Time: 4098-1191 PT Time Calculation (min) (ACUTE ONLY): 30 min   Charges:   PT Evaluation $PT Eval Low Complexity: 1 Procedure PT Treatments $Therapeutic Exercise: 8-22 mins   PT G Codes:        Danielle [name] A Danielle [name], PT 06/09/2015, 6:08 PM

## 2015-06-09 NOTE — Progress Notes (Signed)
Recreation Therapy Notes  Date: 02.03.17 Time: 3:00 pm Location: Craft Room  Group Topic: Communication, Problem Solving, Teamwork  Goal Area(s) Addresses:  Patient will work in teams towards shared goal. Patient will verbalize skills needed to make activities successful. Patient will verbalize benefit of using skills identify to reach post d/c goals.  Behavioral Response: Left early  Intervention: Landing Pad  Activity: Patients were given 15 straws and about 2.5-3 feet of tape and instructed to build a contraption to catch a golf ball dropped from about 4 feet.  Education: LRT educated patients on why these skills are important.  Education Outcome: In group clarification offered  Clinical Observations/Feedback: Patient left group at approximately 3:18 pm. Patient returned, but did not contribute to group discussion.  Jacquelynn Cree, LRT/CTRS 06/09/2015 4:51 PM

## 2015-06-09 NOTE — Telephone Encounter (Signed)
Received a fax from [employer], patients medication, Aripiprazole ODT  tablets has been approved from 06/07/2015 - 06/06/2016

## 2015-06-09 NOTE — [facility] Group Notes (Signed)
[facility] Group Notes:  (Nursing/MHT/Case Management/Adjunct)  Date:  06/09/2015  Time:  2:56 PM  Type of Therapy:  Group Therapy  Participation Level:  Active  Participation Quality:  Appropriate, Attentive and Supportive  Affect:  Appropriate  Cognitive:  Alert, Appropriate and Oriented  Insight:  Appropriate  Engagement in Group:  Engaged  Modes of Intervention:  Activity  Summary of Progress/Problems:  Cotey Rakes De'Chelle Yardley Beltran 06/09/2015, 2:56 PM

## 2015-06-09 NOTE — Tx Team (Signed)
Initial Interdisciplinary Treatment Plan   PATIENT STRESSORS: Marital or family conflict   PATIENT STRENGTHS: Average or above average intelligence Communication skills Motivation for treatment/growth   PROBLEM LIST: Problem List/Patient Goals Date to be addressed Date deferred Reason deferred Estimated date of resolution  "[name]"                                                       DISCHARGE CRITERIA:  Improved stabilization in mood, thinking, and/or behavior  PRELIMINARY DISCHARGE PLAN: Return to previous living arrangement  PATIENT/FAMIILY INVOLVEMENT: This treatment plan has been presented to and reviewed with the patient, Danielle [name], and/or family member, .  The patient and family have been given the opportunity to ask questions and make suggestions.  Danielle [name] 06/09/2015, 7:18 PM

## 2015-06-09 NOTE — [facility] Group Notes (Signed)
[facility] LCSW Aftercare Discharge Planning Group Note   06/09/2015 2:14 PM  Participation Quality: Active  Mood/Affect:  Anxious  Depression Rating: Patient rated her depression as a 6 out of 10.  Anxiety Rating: Patient rated her anxiety as a 9 out of 10.  Thoughts of Suicide:  No Will you contract for safety?   NA  Current AVH:  No  Plan for Discharge/Comments: Patient attended and participated in group discussion appropriately. Patient identified her goals to speak with her doctor and assigned CSW. Patient plans to return to her residence with her spouse and son. She will follow up with her outpatient services provider Dr. Kathryne Sharper with [facility]. Patient left early due to anxiety from the amount of members in the room.   Transportation Means: Patient reported that her spouse will provide transportation at discharge.   Supports: Patient participates in outpatient services and has family support.  Jenel Lucks, CSW Intern 06/09/15

## 2015-06-09 NOTE — Progress Notes (Signed)
D: Pt denies SI/HI/AVH. Pt is pleasant and cooperative, affect is flat and sad but brightens upon approach. Pt stated "she feels better" Patient appears less anxious.   A: Pt was offered support and encouragement. Pt was given scheduled medications. Pt was encouraged to attend groups. Q 15 minute checks were done for safety.  R:Pt did not attend group.  Pt is taking medication. Pt has no complaints.Pt receptive to treatment and safety maintained on unit.

## 2015-06-09 NOTE — Tx Team (Signed)
Interdisciplinary Treatment Plan Update (Adult)  Date:  06/09/2015 Time Reviewed:  12:11 PM  Progress in Treatment: Attending groups: Yes. Participating in groups:  Yes. Taking medication as prescribed:  Yes. Tolerating medication:  Yes. Family/Significant othe contact made:  No, will contact:  Arlyn Dunning  Patient understands diagnosis:  Yes. Discussing patient identified problems/goals with staff:  Yes. Medical problems stabilized or resolved:  Yes. Denies suicidal/homicidal ideation: Yes. Issues/concerns per patient self-inventory:  Yes. Other:  New problem(s) identified: No, Describe:  NA  Discharge Plan or Barriers: Pt plans to return home and follow up with outpatient.    Reason for Continuation of Hospitalization: Depression Hallucinations Medication stabilization Suicidal ideation  Comments:The patient has a long history of schizophrenia. She has been in the care of Dr. Adele Schilder for several years now and relatively stable on a combination of Haldol, Depakote, and trazodone. Recently the patient started complaining of increased paranoia venting her from leaving the house and auditory hallucinations. Dr. Adele Schilder plan to taper the Haldol of and switch the patient to Abilify however the patient could not get her Abilify as of yet due to prior approval process. She became increasingly paranoid, depressed, hallucinating, and thinking of hurting herself. She admits that she does not want to die but has been very frustrated with no access to necessary medications and deterioration of her general health. For extended periods of time she has been unable to walk and has been falling frequently. She describes it as episodes of dizziness. She has been seen by neurologist rule out seizures. She is now under investigation children by cardiologist with a plan to do Holter monitoring next week. The patient cannot function very well at home. Apparently she does not use a walker but walks along the  walls. She's been falling frequently with head bumps and cuts. She is very frustrated because falls happen randomly in most embarrassing circumstances. She has been trying to hurt herself by hitting herself. As she is sitting in the wheelchair she started hitting her legs. She also experiences periods of confusion. She noticed difficulties with her speech. Indeed her speech is slurred. She reports many symptoms of depression with poor sleep, decreased appetite, anhedonia, feeling of guilt and hopelessness worthlessness, poor energy and concentration, social isolation, crying spells and passive suicidal ideation. She reports increased anxiety symptoms of PTSD type. She experiences more hallucinations and paranoia since Haldol dose was lowered. She denies any alcohol or illicit substance use.  Estimated length of stay: 7 days   New goal(s): NA  Review of initial/current patient goals per problem list:   1.  Goal(s): Patient will participate in aftercare plan * Met:  * Target date: at discharge * As evidenced by: Patient will participate within aftercare plan AEB aftercare provider and housing plan at discharge being identified.   2.  Goal (s): Patient will exhibit decreased depressive symptoms and suicidal ideations. * Met:  *  Target date: at discharge * As evidenced by: Patient will utilize self rating of depression at 3 or below and demonstrate decreased signs of depression or be deemed stable for discharge by MD.   3.  Goal(s): Patient will demonstrate decreased signs and symptoms of anxiety. * Met:  * Target date: at discharge * As evidenced by: Patient will utilize self rating of anxiety at 3 or below and demonstrated decreased signs of anxiety, or be deemed stable for discharge by MD 4.  Goal (s): Patient will demonstrate decreased symptoms of psychosis. * Met: No  *  Target date: at discharge * As evidenced by: Patient will not endorse signs of psychosis or be deemed stable for  discharge by MD.   Attendees: Patient:  Danielle [name] 2/3/201712:11 PM  Family:   2/3/201712:11 PM  Physician:   Dr. Bary Leriche  2/3/201712:11 PM  Nursing:   Silva Bandy, RN 2/3/201712:11 PM  Case Manager:   2/3/201712:11 PM  Counselor:   2/3/201712:11 PM  Other:  Wray Kearns, [place] 2/3/201712:11 PM  Other:  Everitt Amber, [place] 2/3/201712:11 PM  Other:   2/3/201712:11 PM  Other:  2/3/201712:11 PM  Other:  2/3/201712:11 PM  Other:  2/3/201712:11 PM  Other:  2/3/201712:11 PM  Other:  2/3/201712:11 PM  Other:  2/3/201712:11 PM  Other:   2/3/201712:11 PM   Scribe for Treatment Team:   Campbell Stall Persis Graffius,MSW, [place]  06/09/2015, 12:11 PM

## 2015-06-09 NOTE — Plan of Care (Signed)
Problem: Alteration in thought process Goal: LTG-Patient has not harmed self or others in at least 2 days Outcome: Not Progressing No self harm although verbalizes "I feel like I want to stab by legs"  Verbalizes that she will not do it

## 2015-06-10 ENCOUNTER — Inpatient Hospital Stay: Payer: BLUE CROSS/BLUE SHIELD

## 2015-06-10 NOTE — Plan of Care (Signed)
Problem: Alteration in mood Goal: LTG-Pt's behavior demonstrates decreased signs of depression (Patient's behavior demonstrates decreased signs of depression to the point the patient is safe to return home and continue treatment in an outpatient setting)  Outcome: Not Progressing Patient reports that she is not feeling better today than yesterday

## 2015-06-10 NOTE — Progress Notes (Signed)
Patient was found by this writer on the floor next to her bed, lying on her left side.  Patient stated that she had fallen while transferring from her wheelchair to her bed.  Patient stated that she hit her head.  Patient was asked to point to where she hit her head.  There was no redness or swelling in the area to which the patient pointed.  The entirety of the patient's head was examined for signs of trauma.  None were found at the time of the fall.  Patient denied any pain at the time of the fall.  Patient was helped to the chair and vital signs were taken.  Neurocheck was done.  Vital signs were normal.  Patient was at her baseline neurologically.  CT scan of the head was ordered by physician.  For a few minutes during the patient's CT scan, she would not respond to commands by the technician.  Her respirations were normal, but she would not open her eyes or answer the technician when she was addressed.  After a few minutes, patient was completely responsive and cooperative again.  Patient continues to be watched closely at this time.

## 2015-06-10 NOTE — Progress Notes (Signed)
[facility] MD Progress Note  06/10/2015 6:16 PM Danielle [name]  MRN:  [id number]  Subjective:  The patient reports still having problems with auditory hallucinations telling her that she is worthless. He says she is also having some visual hallucinations of seeing "shadow people". She does not want to kill herself even though she wants to hit herself. She will try to use her fist against her thighs to hit herself. The patient continues to have problems with weakness and falls and fell earlier today. She did hit her head. CT of the head did show advanced frontal lobe loss. No muscle aches or pain. She was placed with a one-to-one sitter. The patient did sleep fairly well. Vital signs have been stable and she denies any other new somatic complaints. Appetite is fair. The patient has been reclusive to her room.   Past psychiatric history. She was diagnosed with schizophrenia many years ago. She was in the hospital 3 or 4 times before always after suicide attempt. She had attempted suicide by overdose and drowning. She has been tried on multiple medications including Risperdal, Vega, Haldol, Zoloft, Wellbutrin, amitriptyline, trazodone. She is not historian and did not recognize the name of Risperdal thinking that her son was treated with Risperdal. In review of her records it is clear that she has been on Risperdal for extensive periods of time.   Family psychiatric history. She was adopted. She has a son with developmental disability.  Social history. She has a high school diploma. [place] The family relocated to [place] after her father passed away and they no longer afford to live in [place]. She lives with her husband and her son. Apparently she does not have disability. She has Media pl[employer]. She does not qualify for Medicaid.   Principal Problem: Paranoid schizophrenia (HCC) Diagnosis:   Patient Active Problem List   Diagnosis Date Noted    . Tobacco use disorder [F17.200] 06/08/2015  . Hyperammonemia (HCC) [E72.20] 06/08/2015  . Paranoid schizophrenia (HCC) [F20.0]   . Migraine headache [G43.909] 06/04/2012  . Seizure (HCC) [R56.9] 06/04/2012  . Hypothyroidism [E03.9] 06/04/2012  . OSA (obstructive sleep apnea) [G47.33] 08/29/2011  . Post traumatic stress disorder (PTSD) [F43.10] 07/31/2011   Total Time spent with patient: 30 minutes  Past Psychiatric History: Schizophrenia.   Past Medical History:  Past Medical History  Diagnosis Date  . Depressed   . Seizures (HCC)   . Heart murmur   . Hypothyroidism   . Anemia   . Headache(784.0)   . Anxiety   . PTSD (post-traumatic stress disorder)   . Shortness of breath   . Schizophrenia (HCC)   . Bipolar 1 disorder [facility])     Past Surgical History  Procedure Laterality Date  . Tubal ligation  1996  . Foot surgery     Family History:  Family History  Problem Relation Age of Onset  . Adopted: Yes   Family Psychiatric  History: See H&P.   Social History:  History  Alcohol Use No     History  Drug Use No    Social History   Social History  . Marital Status: Married    Spouse Name: N/A  . Number of Children: 2  . Years of Education: N/A   Occupational History  . unemployed    Social History Main Topics  . Smoking status: Former Smoker -- 1.50 packs/day for 30 years    Types: Cigarettes  Quit date: 09/20/2011  . Smokeless tobacco: Never Used  . Alcohol Use: No  . Drug Use: No  . Sexual Activity: Yes    Birth Control/ Protection: Surgical   Other Topics Concern  . None   Social History Narrative   Pt is adopted. Unsure of family history.      Sleep: Fair  Appetite:  Fair  Current Medications: Current Facility-Administered Medications  Medication Dose Route Frequency Provider Last Rate Last Dose  . ARIPiprazole (ABILIFY) tablet 20 mg  20 mg Oral Daily Shari Prows, MD   20 mg at 06/10/15 0837  . aspirin EC tablet 81 mg  81 mg  Oral Q breakfast Jimmy Footman, MD   81 mg at 06/10/15 0837  . cyanocobalamin ((VITAMIN B-12)) injection 1,000 mcg  1,000 mcg Intramuscular Q0600 Shari Prows, MD   1,000 mcg at 06/10/15 0641  . [START ON 06/11/2015] cyanocobalamin tablet 1,000 mcg  1,000 mcg Oral Daily Jolanta B Pucilowska, MD      . folic acid (FOLVITE) tablet 1 mg  1 mg Oral Daily Jolanta B Pucilowska, MD   1 mg at 06/10/15 0837  . ibuprofen (ADVIL,MOTRIN) tablet 600 mg  600 mg Oral Q8H PRN Jimmy Footman, MD   600 mg at 06/09/15 1959  . lactulose (CHRONULAC) 10 GM/15ML solution 30 g  30 g Oral BID Shari Prows, MD   30 g at 06/10/15 1610  . levothyroxine (SYNTHROID, LEVOTHROID) tablet 75 mcg  75 mcg Oral QAC breakfast Jimmy Footman, MD   75 mcg at 06/10/15 [phone]  . lithium carbonate capsule 300 mg  300 mg Oral TID WC Jolanta B Pucilowska, MD   300 mg at 06/10/15 1645  . LORazepam (ATIVAN) tablet 1 mg  1 mg Oral Q4H PRN Shari Prows, MD   1 mg at 06/10/15 1421  . magnesium hydroxide (MILK OF MAGNESIA) suspension 30 mL  30 mL Oral Daily PRN Jimmy Footman, MD      . nicotine (NICODERM CQ - dosed in mg/24 hours) patch 21 mg  21 mg Transdermal Daily PRN Jimmy Footman, MD      . topiramate (TOPAMAX) tablet 100 mg  100 mg Oral QHS Shari Prows, MD   100 mg at 06/09/15 2138  . traZODone (DESYREL) tablet 100 mg  100 mg Oral QHS Jimmy Footman, MD   100 mg at 06/09/15 2138    Lab Results:  Results for orders placed or performed during the hospital encounter of 06/07/15 (from the past 48 hour(s))  Ammonia     Status: Abnormal   Collection Time: 06/09/15  2:23 PM  Result Value Ref Range   Ammonia 52 (H) 9 - 35 umol/L  Magnesium     Status: None   Collection Time: 06/09/15  2:23 PM  Result Value Ref Range   Magnesium 1.8 1.7 - 2.4 mg/dL    Physical Findings: AIMS:  , ,  ,  ,    CIWA:    COWS:     Musculoskeletal: Strength &  Muscle Tone: within normal limits Gait & Station: unsteady Patient leans: N/A  Psychiatric Specialty Exam: Review of Systems  Constitutional: Positive for malaise/fatigue. Negative for fever, chills, weight loss and diaphoresis.  HENT: Negative for congestion, ear discharge, ear pain, hearing loss, nosebleeds and tinnitus.   Eyes: Negative.  Negative for blurred vision, double vision, photophobia, pain and discharge.  Respiratory: Negative.  Negative for cough, hemoptysis, sputum production, shortness of breath and stridor.  Cardiovascular: Negative.  Negative for chest pain, palpitations, orthopnea, claudication and leg swelling.  Gastrointestinal: Negative.  Negative for heartburn, nausea, vomiting, abdominal pain, diarrhea, constipation and blood in stool.  Genitourinary: Negative.  Negative for dysuria, urgency and frequency.  Skin: Negative.  Negative for itching and rash.  Neurological: Positive for headaches. Negative for weakness.       Weakness and frequent falls  Endo/Heme/Allergies: Negative for environmental allergies and polydipsia. Does not bruise/bleed easily.  Psychiatric/Behavioral: Positive for depression and suicidal ideas. The patient is nervous/anxious.   All other systems reviewed and are negative.   Blood pressure 99/64, pulse 85, temperature 98.6 F (37 C), temperature source Oral, resp. rate 16, height 5\' 11"  (1.803 m), weight 94.348 kg (208 lb), SpO2 100 %.Body mass index is 29.02 kg/(m^2).  General Appearance: Disheveled  Eye Solicitor::  Fair  Speech:  Slurred  Volume:  Decreased  Mood:  Anxious, Depressed and Hopeless  Affect:  Depressed  Thought Process:  Goal Directed  Orientation:  Full (Time, Place, and Person)  Thought Content:  Delusions and Paranoid Ideation  Suicidal Thoughts:  Yes.  with intent/plan  Homicidal Thoughts:  No  Memory:  Immediate;   Fair Recent;   Fair Remote;   Fair  Judgement:  Fair  Insight:  Shallow  Psychomotor Activity:   Decreased  Concentration:  Fair  Recall:  [employer] of Knowledge:Fair  Language: Fair  Akathisia:  No  Handed:  Right  AIMS (if indicated):     Assets:  Communication Skills Desire for Improvement Financial Resources/Insurance Housing Resilience Social Support  ADL's:  Intact  Cognition: WNL  Sleep:  Number of Hours: 7.25   Treatment Plan Summary: Daily contact with patient to assess and evaluate symptoms and progress in treatment and Medication management   [name] is a 45 year old female with a history of paranoid schizophrenia admitted for suicidal ideation and self-injurious behavior in the context of physical illness.  1. Suicidal ideation. The patient assures me that she does not want to die, yet she is so frustrated with frequent falls and inability to walk that she is considering hurting herself. She's been repeatedly hitting her legs.   2. Psychosis. The patient has been maintained on Haldol for several years now. Apparently it stopped working and the patient started experiencing auditory hallucinations and paranoia again. Dr. Lolly Mustache, her primary psychiatrist attempted to switch her to Abilify. At the patient did not have access to Abilify as of yet as it needed prior approval. She believes that it may be improved by mouth. We will start Abilify and check with her pharmacy for the availability. There is an option of giving her Abilify maintenance injection. The patient has also been on Depakote. VPA level is 62, ammonia of 69 now decreased to 52. Depakote was discontinued but will check Free VPA level as that may have been contributing to toxicity. She is on lactulose. I will increase Abilify and start Lithium.  3. Insomnia. We will continue trazodone.  4. Hypothyroidism. We'll continue Synthroid.  5. Smoking. She is on nicotine patch.  6. Metabolic syndrome. Lipid panel is elevated. Hemoglobin A1c TSH are normal. Prolactin is pending.   7. Low vitamin B12 and folate.  We started both.   8. Falls. She was seen by a neurologist in the community and seizure disorder was ruled out. She is awaiting Holter monitoring in the community. Cardiology consult is greatly appreciated. I ordered EKG and Mg.    8. Headaches. She is  on Topamax. Husband reports that she started falling when Topamax was increased from 100 to 200 mg. It is unlikely that she had Topamax for 20 years.  9. Disposition. She will be discharged to home with her family. She will follow up with Dr. Lolly Mustache.   Observation Level/Precautions: 15 minute checks  Laboratory: CBC Chemistry Profile UDS UA  Psychotherapy:   Medications:   Consultations:   Discharge Concerns:   Estimated LOS:  Other:    I certify that inpatient services furnished can reasonably be expected to improve the patient's condition.   Kristine Linea, MD 2/2/201711:55 AM       Revision History     Date/Time User Provider Type Action   06/08/2015 3:12 PM Shari Prows, MD Psychiatrist Addend   06/08/2015 12:11 PM Shari Prows, MD Psychiatrist Sign   View Details Report          Levora Angel, MD 06/10/2015, 6:16 PM [facility] MD Progress Note  06/10/2015 6:18 PM Danielle [name]  MRN:  [id number]  Subjective:  Ms. Corriveau has a history of paranoid schizophrenia, possibly schizoaffective disorder. She was admitted for suicidal ideation in the context of recent medication adjustments. She also has a history of frequent falls that she finds very embarrassing and wants to kill herself over it. Dr.Arfeed discontinued Haldol and started her on Abilify recently but the patient had no access to Abilify due to prior approval process. She came to the hospital confused, with slurred speech, and difficulties walking. She has been on Depakote. Her ammonia was elevated. We discontinued Depakote and started lithium. She has a long history of falls possibly for 20 years. She has been using a scooter  and an walker at home. Some of her falls resulted in head injuries and as well as broken bones. I asked Dr. Mariah Milling, cardiology for consultation.  The patient today is very tearful. She complains of a panic attack lasting for several hours. She is still depressed, anxious, suicidal. She moves about in a wheelchair. She participates in programming. Sleep and appetite are fair.   Principal Problem: Paranoid schizophrenia (HCC) Diagnosis:   Patient Active Problem List   Diagnosis Date Noted  . Tobacco use disorder [F17.200] 06/08/2015  . Hyperammonemia (HCC) [E72.20] 06/08/2015  . Paranoid schizophrenia (HCC) [F20.0]   . Migraine headache [G43.909] 06/04/2012  . Seizure (HCC) [R56.9] 06/04/2012  . Hypothyroidism [E03.9] 06/04/2012  . OSA (obstructive sleep apnea) [G47.33] 08/29/2011  . Post traumatic stress disorder (PTSD) [F43.10] 07/31/2011   Total Time spent with patient: 30 minutes  Past Psychiatric History: Schizophrenia.   Past Medical History:  Past Medical History  Diagnosis Date  . Depressed   . Seizures (HCC)   . Heart murmur   . Hypothyroidism   . Anemia   . Headache(784.0)   . Anxiety   . PTSD (post-traumatic stress disorder)   . Shortness of breath   . Schizophrenia (HCC)   . Bipolar 1 disorder [facility])     Past Surgical History  Procedure Laterality Date  . Tubal ligation  1996  . Foot surgery     Family History:  Family History  Problem Relation Age of Onset  . Adopted: Yes   Family Psychiatric  History: See H&P.   Social History:  History  Alcohol Use No     History  Drug Use No    Social History   Social History  . Marital Status: Married    Spouse Name: N/A  . Number of Children:  2  . Years of Education: N/A   Occupational History  . unemployed    Social History Main Topics  . Smoking status: Former Smoker -- 1.50 packs/day for 30 years    Types: Cigarettes    Quit date: 09/20/2011  . Smokeless tobacco: Never Used  . Alcohol Use: No    . Drug Use: No  . Sexual Activity: Yes    Birth Control/ Protection: Surgical   Other Topics Concern  . None   Social History Narrative   Pt is adopted. Unsure of family history.   Additional Social History:                         Sleep: Fair  Appetite:  Fair  Current Medications: Current Facility-Administered Medications  Medication Dose Route Frequency Provider Last Rate Last Dose  . ARIPiprazole (ABILIFY) tablet 20 mg  20 mg Oral Daily Shari Prows, MD   20 mg at 06/10/15 0837  . aspirin EC tablet 81 mg  81 mg Oral Q breakfast Jimmy Footman, MD   81 mg at 06/10/15 0837  . cyanocobalamin ((VITAMIN B-12)) injection 1,000 mcg  1,000 mcg Intramuscular Q0600 Shari Prows, MD   1,000 mcg at 06/10/15 0641  . [START ON 06/11/2015] cyanocobalamin tablet 1,000 mcg  1,000 mcg Oral Daily Jolanta B Pucilowska, MD      . folic acid (FOLVITE) tablet 1 mg  1 mg Oral Daily Jolanta B Pucilowska, MD   1 mg at 06/10/15 0837  . ibuprofen (ADVIL,MOTRIN) tablet 600 mg  600 mg Oral Q8H PRN Jimmy Footman, MD   600 mg at 06/09/15 1959  . lactulose (CHRONULAC) 10 GM/15ML solution 30 g  30 g Oral BID Shari Prows, MD   30 g at 06/10/15 1610  . levothyroxine (SYNTHROID, LEVOTHROID) tablet 75 mcg  75 mcg Oral QAC breakfast Jimmy Footman, MD   75 mcg at 06/10/15 [phone]  . lithium carbonate capsule 300 mg  300 mg Oral TID WC Jolanta B Pucilowska, MD   300 mg at 06/10/15 1645  . LORazepam (ATIVAN) tablet 1 mg  1 mg Oral Q4H PRN Shari Prows, MD   1 mg at 06/10/15 1421  . magnesium hydroxide (MILK OF MAGNESIA) suspension 30 mL  30 mL Oral Daily PRN Jimmy Footman, MD      . nicotine (NICODERM CQ - dosed in mg/24 hours) patch 21 mg  21 mg Transdermal Daily PRN Jimmy Footman, MD      . topiramate (TOPAMAX) tablet 100 mg  100 mg Oral QHS Shari Prows, MD   100 mg at 06/09/15 2138  . traZODone (DESYREL) tablet  100 mg  100 mg Oral QHS Jimmy Footman, MD   100 mg at 06/09/15 2138    Lab Results:  Results for orders placed or performed during the hospital encounter of 06/07/15 (from the past 48 hour(s))  Ammonia     Status: Abnormal   Collection Time: 06/09/15  2:23 PM  Result Value Ref Range   Ammonia 52 (H) 9 - 35 umol/L  Magnesium     Status: None   Collection Time: 06/09/15  2:23 PM  Result Value Ref Range   Magnesium 1.8 1.7 - 2.4 mg/dL    Physical Findings: AIMS:  , ,  ,  ,    CIWA:    COWS:     Musculoskeletal: Strength & Muscle Tone: within normal limits Gait & Station: unsteady Patient leans: N/A  Psychiatric Specialty Exam: Review of Systems  Constitutional: Positive for malaise/fatigue.  Musculoskeletal: Positive for falls.  Psychiatric/Behavioral: Positive for depression and suicidal ideas. The patient is nervous/anxious.   All other systems reviewed and are negative.   Blood pressure 99/64, pulse 85, temperature 98.6 F (37 C), temperature source Oral, resp. rate 16, height 5\' 11"  (1.803 m), weight 94.348 kg (208 lb), SpO2 100 %.Body mass index is 29.02 kg/(m^2).  General Appearance: Disheveled  Eye Solicitor::  Fair  Speech:  Slurred  Volume:  Decreased  Mood:  Anxious, Depressed and Hopeless  Affect:  Labile and Tearful  Thought Process:  Goal Directed  Orientation:  Full (Time, Place, and Person)  Thought Content:  Delusions and Paranoid Ideation  Suicidal Thoughts:  Yes.  with intent/plan  Homicidal Thoughts:  No  Memory:  Immediate;   Fair Recent;   Fair Remote;   Fair  Judgement:  Fair  Insight:  Shallow  Psychomotor Activity:  Decreased  Concentration:  Fair  Recall:  [employer] of Knowledge:Fair  Language: Fair  Akathisia:  No  Handed:  Right  AIMS (if indicated):     Assets:  Communication Skills Desire for Improvement Financial Resources/Insurance Housing Resilience Social Support  ADL's:  Intact  Cognition: WNL  Sleep:  Number  of Hours: 7.25   Treatment Plan Summary: Daily contact with patient to assess and evaluate symptoms and progress in treatment and Medication management   [name] is a 45 year old female with a history of paranoid schizophrenia admitted for suicidal ideation and self-injurious behavior in the context of physical illness.  1. Suicidal ideation. The patient assures me that she does not want to die, yet she is so frustrated with frequent falls and inability to walk that she is considering hurting herself. She's been repeatedly hitting her legs.   2. Psychosis. The patient has been maintained on Haldol for several years now. Apparently it stopped working and the patient started experiencing auditory hallucinations and paranoia again. Dr. Lolly Mustache, her primary psychiatrist attempted to switch her to Abilify. At the patient did not have access to Abilify as of yet as it needed prior approval. She believes that it may be improved by mouth. We will start Abilify and check with her pharmacy for the availability. There is an option of giving her Abilify maintenance injection. The patient has also been on Depakote. VPA level is 62 today, ammonia 69 yesterday, 52 today. Depakote was discontinued. She is on lactulose. She is on Abilify 20mg  po daily and Lithium 300mg  po TID for mood stabilization. Will check lithium level and BUN/creatinine.  3. Insomnia. We will continue trazodone 100mg  po nightly for insomnia.  4. Hypothyroidism. We'll continue Synthroid po daily  5. S/P Fall: The patient fell transferring from her wheelchair to the bed. She was placed with a one-to-one sitter. The patient did better. CT of the head however did show frontal lobe atrophy advanced for her age.   5. Smoking. She is on nicotine patch.  6. Metabolic syndrome. Lipid panel is elevated. Hemoglobin A1c TSH are normal. Prolactin is pending.   7. Low vitamin B12 and folate. We started both.   8. Falls. She was seen by a  neurologist in the community and seizure disorder was ruled out. She is awaiting Holter monitoring in the community. Cardiology consult is greatly appreciated. I ordered EKG and Mg.    8. Headaches. She is on Topamax. Husband reports that she started falling when Topamax was increased from 100  to 200 mg. It is unlikely that she had Topamax for 20 years.  9. Disposition. She will be discharged to home with her family. She will follow up with Dr. Lolly Mustache.   Observation Level/Precautions: 15 minute checks                 I certify that inpatient services furnished can reasonably be expected to improve the patient's condition.   Kristine Linea, MD 2/2/201711:55 AM       Revision History     Date/Time User Provider Type Action   06/08/2015 3:12 PM Shari Prows, MD Psychiatrist Addend   06/08/2015 12:11 PM Shari Prows, MD Psychiatrist Sign   View Details Report          Levora Angel, MD 06/10/2015, 6:18 PM
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
## 2015-06-10 NOTE — Progress Notes (Signed)
Patient is visiting with the chaplain today at snack time

## 2015-06-10 NOTE — Plan of Care (Signed)
Problem: Ineffective individual coping Goal: STG: Patient will remain free from self harm Outcome: Progressing Pt remains free from harm  Problem: Alteration in thought process Goal: LTG-Patient verbalizes understanding importance med regimen (Patient verbalizes understanding of importance of medication regimen and need to continue outpatient care.)  Outcome: Progressing Pt requests information on medications and asks staff questions

## 2015-06-10 NOTE — [facility] Group Notes (Signed)
[facility] LCSW Group Therapy  06/10/2015 3:25 PM  Type of Therapy:  Group Therapy  Participation Level:  Did Not Attend  Modes of Intervention:  Discussion, Education, Socialization and Support  Summary of Progress/Problems: Boundaries: Patients defined boundaries and discussed the importance of them. Patients identified their own boundaries and how they feel when they are crossed. Patients discussed ways to create and/ or improve their personal boundaries.   Ritchard Paragas L Jorge Amparo MSW, LCSWA  06/10/2015, 3:25 PM  

## 2015-06-10 NOTE — Plan of Care (Signed)
Problem: Alteration in thought process Goal: LTG-Patient is able to perceive the environment accurately Outcome: Not Progressing Patient reports she continues to experience auditory and visual hallucinations this shift.  However, patient is able to respond to calls for snacks and meals.

## 2015-06-10 NOTE — Progress Notes (Signed)
D:  Patient is alert on the unit this shift.  Patient wheels herself to snack time and meal times.  Patient endorses vague suicidal ideation this shift.  Patient endorses auditory hallucinations telling her "you are stupid, you are dumb."  Patient endorses visual hallucinations of "shadow people" at the present time.   A:  Scheduled medications are administered to patient as per MD orders.  Emotional support and encouragement are provided.  Patient is maintained on q.15 minute safety checks.  Patient is informed to notify staff with questions or concerns. R:  No adverse medication reactions are noted.  Patient is cooperative with medication administration and treatment plan today.  Patient is receptive, calm and cooperative on the unit at this time.  Patient does not interact with others on the unit this shift.  Patient contracts for safety at this time.  Patient remains safe at this time.

## 2015-06-10 NOTE — Progress Notes (Signed)
D: Pt affect is anxious and depressed this evening. Pt denies SI/HI. Pt states that she hears voices "telling me I'm dumb and stupid" and sees "shadow people." Pt c/o back pain rated a 7 out of 10, which she requests medication for. Pt requests PRN Ativan with nighttime medications. Pt seen out in hallway overnight, stating "I just want to sleep and I'm not able to." Pt requests another Ativan. Writer informed her that it was too soon to give her Ativan, as the order is for every 4 hours. Pt then began to cry and use her fists to hit herself in the legs. A: Writer sat with pt and worked on deep breathing exercises. PRN Ativan given at later time. Medications administered with education. Medication information printed and given to pt upon request. q15 minute safety checks maintained. R: Pt appears calmer and returned to room. Pt remains free from harm.

## 2015-06-11 ENCOUNTER — Other Ambulatory Visit (HOSPITAL_COMMUNITY): Payer: Self-pay | Admitting: Psychiatry

## 2015-06-11 LAB — BASIC METABOLIC PANEL
ANION GAP: 8 (ref 5–15)
BUN: 14 mg/dL (ref 6–20)
CO2: 20 mmol/L — AB (ref 22–32)
Calcium: 9.6 mg/dL (ref 8.9–10.3)
Chloride: 108 mmol/L (ref 101–111)
Creatinine, Ser: 0.8 mg/dL (ref 0.44–1.00)
GFR calc Af Amer: >60 mL/min (ref >60)
GFR calc non Af Amer: >60 mL/min (ref >60)
GLUCOSE: 107 mg/dL — AB (ref 65–99)
POTASSIUM: 4.4 mmol/L (ref 3.5–5.1)
Sodium: 136 mmol/L (ref 135–145)

## 2015-06-11 LAB — AMMONIA: Ammonia: 37 umol/L — ABNORMAL HIGH (ref 9–35)

## 2015-06-11 LAB — CBC WITH DIFFERENTIAL/PLATELET
Basophils Absolute: 0.1 10*3/uL (ref 0–0.1)
Basophils Relative: 1 %
Eosinophils Absolute: 0.1 10*3/uL (ref 0–0.7)
Eosinophils Relative: 1 %
HEMATOCRIT: 38.2 % (ref 35.0–47.0)
HEMOGLOBIN: 13.1 g/dL (ref 12.0–16.0)
LYMPHS ABS: 1.9 10*3/uL (ref 1.0–3.6)
LYMPHS PCT: 24 %
MCH: 31.3 pg (ref 26.0–34.0)
MCHC: 34.3 g/dL (ref 32.0–36.0)
MCV: 91.3 fL (ref 80.0–100.0)
MONOS PCT: 7 %
Monocytes Absolute: 0.6 10*3/uL (ref 0.2–0.9)
NEUTROS ABS: 5.3 10*3/uL (ref 1.4–6.5)
NEUTROS PCT: 67 %
Platelets: 242 10*3/uL (ref 150–440)
RBC: 4.19 MIL/uL (ref 3.80–5.20)
RDW: 12.6 % (ref 11.5–14.5)
WBC: 8 10*3/uL (ref 3.6–11.0)

## 2015-06-11 NOTE — [facility] Group Notes (Signed)
[facility] LCSW Group Therapy  06/11/2015 2:51 PM  Type of Therapy:  Group Therapy  Participation Level:  Minimal  Participation Quality:  Attentive  Affect:  Flat  Cognitive:  Alert  Insight:  Improving  Engagement in Therapy:  Improving  Modes of Intervention:  Activity, Discussion, Education, Socialization and Support  Summary of Progress/Problems:Feelings around diagnosis: Patients discussed what mental health means to them and how it has impacted their lives. Patients discussed receiving a diagnosis and how it made them feel. They were encouraged to share experiences related to mental health diagnosis and how other people have reacted. Patient attended group and stayed the entire time. She sat quietly and listened to other group members.   Danielle [name] L Shaan Rhoads  MSW, LCSWA   06/11/2015, 2:51 PM  

## 2015-06-11 NOTE — Progress Notes (Signed)
Patient is depressed & anxious but calm & cooperative.Positive for auditory hallucination telling her that she is "stupid & dumb".Denies suicidal ideation.Stated that she wants to go to groups but she is not comfortable in groups.She was in & out of groups.Compliant with medications.Appetite good,low energy level.Sitter for safety.

## 2015-06-11 NOTE — Progress Notes (Addendum)
Rapid response was called after Pt had a syncope episode while on phone with husband and was hard to arouse at approximately. 2100. Vital signs were stable, Nero checked performed. WNL. A&O x 4.  Pt was in Wheelchair so there was no fall. Pt awoke and drank juice and had a snack. At approximately 2130 Pt had another syncope episode while in the bed. Vitals assessed and were stable. O2 stat was 100%. Respirations 18. Pt awoke and had more juice. A&O x 4, Neuro check performed, WNL. .  Doctor on call Paged. Contractor notified. Will continue to monitor. 1:1 sitter at bedside.

## 2015-06-11 NOTE — Progress Notes (Signed)
D: Pt affect is anxious and depressed this evening. Pt denies SI/HI. Cooperative with medications and is currently 1:1 with sitter for safety. Ibuprofen given for headache and PRN ativan given for anxiety. A:  Q15 minute checks maintained for safety as well as 1:1 supervision. Medication given as prescribed. Encouragement and support provided. R: Pt remains free from harm. Affect depressed and sad. Sitter states that she is able to ambulate without much assistance to bathroom. Will continue to monitor.

## 2015-06-11 NOTE — Plan of Care (Signed)
Problem: Alteration in thought process Goal: STG-Patient does not respond to command hallucinations Outcome: Progressing Not responding to hallucinations.
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
## 2015-06-11 NOTE — [facility] Group Notes (Signed)
[facility] Group Notes:  (Nursing/MHT/Case Management/Adjunct)  Date:  06/11/2015  Time: 8:30AM  Type of Therapy:  Community Meeting   Participation Level:  Did Not Attend  Danielle [name] 06/11/2015, 6:18 PM

## 2015-06-11 NOTE — Progress Notes (Signed)
   06/11/15 2100  Clinical Encounter Type  Visited With Patient  Visit Type Code  Referral From Nurse  Consult/Referral To Chaplain  Spiritual Encounters  Spiritual Needs Prayer  Stress Factors  Patient Stress Factors Health changes  Responded to rapid response for Ms. Garceau who has fainted. She was initially nonresponsive and disoriented. Later she asked for prayer, which we prayed. Chap. Sherion Dooly G. Irfan Veal, ext. [phone]

## 2015-06-12 ENCOUNTER — Other Ambulatory Visit (HOSPITAL_COMMUNITY): Payer: Self-pay | Admitting: Psychiatry

## 2015-06-12 LAB — LITHIUM LEVEL: LITHIUM LVL: 0.7 mmol/L (ref 0.60–1.20)

## 2015-06-12 NOTE — [facility] Group Notes (Signed)
[facility] Group Notes:  (Nursing/MHT/Case Management/Adjunct)  Date:  06/12/2015  Time:  1:23 PM  Type of Therapy:  Psychoeducational Skills  Participation Level:  Active  Participation Quality:  Appropriate  Affect:  Appropriate  Cognitive:  Appropriate  Insight:  Appropriate  Engagement in Group:  Engaged  Modes of Intervention:  Discussion and Education  Summary of Progress/Problems:  Danielle [name] 06/12/2015, 1:23 PM

## 2015-06-12 NOTE — Plan of Care (Signed)
Problem: Alteration in thought process Goal: LTG-Patient behavior demonstrates decreased signs psychosis (Patient behavior demonstrates decreased signs of psychosis to the point the patient is safe to return home and continue treatment in an outpatient setting.)  Outcome: Not Progressing Positive for AV hallucinations.

## 2015-06-12 NOTE — [facility] Group Notes (Signed)
[facility] LCSW Group Therapy  06/12/2015 3:25 PM  Type of Therapy:  Group Therapy  Participation Level:  Active  Participation Quality:  Appropriate and Attentive  Affect:  Appropriate and Tearful  Cognitive:  Alert, Appropriate and Oriented  Insight:  Improving  Engagement in Therapy:  Improving  Modes of Intervention:  Discussion, Socialization and Support  Summary of Progress/Problems: Patient attended and participated in group discussion appropriately at first introducing herself and sharing that during an introductory exercise that her 'dream vacation' spot would be "[place]" and shared that she has family in [place]. Patient shared that she lost her father and has dealt with grief and loss and became tearful in group. Patient left group after she shared and did not return with 30 minutes remaining in group.   Lulu Riding, MSW, LCSWA 06/12/2015, 3:25 PM

## 2015-06-12 NOTE — Progress Notes (Signed)
[facility] MD Progress Note  06/12/2015 12:13 PM Danielle [name]  MRN:  [id number]  Subjective:  Danielle [name] is still very depressed and hopeless with suicidal ideation. She is extremely frustrated about her falls. During Danielle weekend she fell several times. She has Actuary. She hit her head and head CT scan was performed that shows atrophy of frontal lobes more advanced for her age. Several times she lost consciousness briefly and became limp intervention. Cardiology input is greatly appreciated. She has no other somatic complaints. She tolerates switched from Depakote to lithium quite well. Ammonia was elevated yesterday. We are waiting for free Depakote level but Danielle [name] will not be placed back on Depakote in any case. She does not seem psychotic but complains of seeing "shadows" and hearing voices. They are not commanding.   Principal Problem: Paranoid schizophrenia ([place]) Diagnosis:   [name] Active Problem List   Diagnosis Date Noted  . Tobacco use disorder [F17.200] 06/08/2015  . Hyperammonemia ([place]) [E72.20] 06/08/2015  . Paranoid schizophrenia ([place]) [F20.0]   . Migraine headache [G43.909] 06/04/2012  . Seizure ([place]) [R56.9] 06/04/2012  . Hypothyroidism [E03.9] 06/04/2012  . OSA (obstructive sleep apnea) [G47.33] 08/29/2011  . Post traumatic stress disorder (PTSD) [F43.10] 07/31/2011   Total Time spent with [name]: 20 minutes  Past Psychiatric History: schizophrenia.  Past Medical History:  Past Medical History  Diagnosis Date  . Depressed   . Seizures ([place])   . Heart murmur   . Hypothyroidism   . Anemia   . Headache(784.0)   . Anxiety   . PTSD (post-traumatic stress disorder)   . Shortness of breath   . Schizophrenia ([place])   . Bipolar 1 disorder [facility])     Past Surgical History  Procedure Laterality Date  . Tubal ligation  1996  . Foot surgery     Family History:  Family History  Problem Relation Age of Onset  . Adopted: Yes   Family Psychiatric  His[place] H&P. Social  History:  History  Alcohol Use No     History  Drug Use No    Social History   Social History  . Marital Status: Married    Spouse Name: N/A  . Number of Children: 2  . Years of Education: N/A   Occupational History  . unemployed    Social History Main Topics  . Smoking status: Former Smoker -- 1.50 packs/day for 30 years    Types: Cigarettes    Quit date: 09/20/2011  . Smokeless tobacco: Never Used  . Alcohol Use: No  . Drug Use: No  . Sexual Activity: Yes    Birth Control/ Protection: Surgical   Other Topics Concern  . None   Social History Narrative   Pt is adopted. Unsure of family history.   Additional Social History:                         Sleep: Fair  Appetite:  Fair  Current Medications: Current Facility-Administered Medications  Medication Dose Route Frequency Provider Last Rate Last Dose  . ARIPiprazole (ABILIFY) tablet 20 mg  20 mg Oral Daily Clovis Fredrickson, MD   20 mg at 06/12/15 0852  . aspirin EC tablet 81 mg  81 mg Oral Q breakfast Hildred Priest, MD   81 mg at 06/12/15 0851  . cyanocobalamin tablet 1,000 mcg  1,000 mcg Oral Daily Clovis Fredrickson, MD   1,000 mcg at 06/12/15 0852  . folic acid (FOLVITE) tablet 1  mg  1 mg Oral Daily Clovis Fredrickson, MD   1 mg at 06/12/15 0851  . ibuprofen (ADVIL,MOTRIN) tablet 600 mg  600 mg Oral Q8H PRN Hildred Priest, MD   600 mg at 06/12/15 0235  . lactulose (CHRONULAC) 10 GM/15ML solution 30 g  30 g Oral BID Clovis Fredrickson, MD   30 g at 06/12/15 0853  . levothyroxine (SYNTHROID, LEVOTHROID) tablet 75 mcg  75 mcg Oral QAC breakfast Hildred Priest, MD   75 mcg at 06/12/15 1610  . lithium carbonate capsule 300 mg  300 mg Oral TID WC Ivania Teagarden B Priya Matsen, MD   300 mg at 06/12/15 0851  . LORazepam (ATIVAN) tablet 1 mg  1 mg Oral Q4H PRN Clovis Fredrickson, MD   1 mg at 06/11/15 2208  . magnesium hydroxide (MILK OF MAGNESIA) suspension 30 mL  30 mL Oral  Daily PRN Hildred Priest, MD      . nicotine (NICODERM CQ - dosed in mg/24 hours) patch 21 mg  21 mg Transdermal Daily PRN Hildred Priest, MD      . topiramate (TOPAMAX) tablet 100 mg  100 mg Oral QHS Clovis Fredrickson, MD   100 mg at 06/11/15 2207  . traZODone (DESYREL) tablet 100 mg  100 mg Oral QHS Hildred Priest, MD   100 mg at 06/11/15 2208    Lab Results:  Results for orders placed or performed during Danielle hospital encounter of 06/07/15 (from Danielle past 48 hour(s))  Basic metabolic panel     Status: Abnormal   Collection Time: 06/11/15  9:31 AM  Result Value Ref Range   Sodium 136 135 - 145 mmol/L   Potassium 4.4 3.5 - 5.1 mmol/L   Chloride 108 101 - 111 mmol/L   CO2 20 (L) 22 - 32 mmol/L   Glucose, Bld 107 (H) 65 - 99 mg/dL   BUN 14 6 - 20 mg/dL   Creatinine, Ser 0.80 0.44 - 1.00 mg/dL   Calcium 9.6 8.9 - 10.3 mg/dL   GFR calc non Af Amer >60 >60 mL/min   GFR calc Af Amer >60 >60 mL/min    Comment: (NOTE) Danielle eGFR has been calculated using Danielle CKD EPI equation. This calculation has not been validated in all clinical situations. eGFR's persistently <60 mL/min signify possible Chronic Kidney Disease.    Anion gap 8 5 - 15  CBC with Differential/Platelet     Status: None   Collection Time: 06/11/15  9:31 AM  Result Value Ref Range   WBC 8.0 3.6 - 11.0 K/uL   RBC 4.19 3.80 - 5.20 MIL/uL   Hemoglobin 13.1 12.0 - 16.0 g/dL   HCT 38.2 35.0 - 47.0 %   MCV 91.3 80.0 - 100.0 fL   MCH 31.3 26.0 - 34.0 pg   MCHC 34.3 32.0 - 36.0 g/dL   RDW 12.6 11.5 - 14.5 %   Platelets 242 150 - 440 K/uL   Neutrophils Relative % 67 %   Neutro Abs 5.3 1.4 - 6.5 K/uL   Lymphocytes Relative 24 %   Lymphs Abs 1.9 1.0 - 3.6 K/uL   Monocytes Relative 7 %   Monocytes Absolute 0.6 0.2 - 0.9 K/uL   Eosinophils Relative 1 %   Eosinophils Absolute 0.1 0 - 0.7 K/uL   Basophils Relative 1 %   Basophils Absolute 0.1 0 - 0.1 K/uL  Ammonia     Status: Abnormal    Collection Time: 06/11/15  9:31 AM  Result Value Ref  Range   Ammonia 37 (H) 9 - 35 umol/L  Lithium level     Status: None   Collection Time: 06/12/15  6:38 AM  Result Value Ref Range   Lithium Lvl 0.70 0.60 - 1.20 mmol/L    Physical Findings: AIMS:  , ,  ,  ,    CIWA:    COWS:     Musculoskeletal: Strength & Muscle Tone: within normal limits Gait & Station: normal [name] leans: N/A  Psychiatric Specialty Exam: Review of Systems  Musculoskeletal: Positive for falls.  All other systems reviewed and are negative.   Blood pressure 92/58, pulse 69, temperature 97.9 F (36.6 C), temperature source Oral, resp. rate 18, height _0  (1.803 m), weight 94.348 kg (208 lb), SpO2 99 %.Body mass index is 29.02 kg/(m^2).  General Appearance: Casual  Eye Contact::  Fair  Speech:  Clear and Coherent  Volume:  Normal  Mood:  Anxious  Affect:  Appropriate  Thought Process:  Goal Directed  Orientation:  Full (Time, Place, and Person)  Thought Content:  Hallucinations: Auditory  Suicidal Thoughts:  Yes.  with intent/plan  Homicidal Thoughts:  No  Memory:  Immediate;   Fair Recent;   Fair Remote;   Fair  Judgement:  Fair  Insight:  Fair  Psychomotor Activity:  Normal  Concentration:  Fair  Recall:  [employer] of Knowledge:Fair  Language: Fair  Akathisia:  No  Handed:  Right  AIMS (if indicated):     Assets:  Communication Skills Desire for Improvement Financial Resources/Insurance Housing Intimacy Resilience Social Support  ADL's:  Intact  Cognition: WNL  Sleep:  Number of Hours: 6.45   Treatment Plan Summary: Daily contact with [name] to assess and evaluate symptoms and progress in treatment and Medication management   Danielle [name] is a 45 year old female with a history of paranoid schizophrenia admitted for suicidal ideation and self-injurious behavior in Danielle context of physical illness.  1. Suicidal ideation. Danielle [name] assures me that she does not want to die, yet she  is so frustrated with frequent falls and inability to walk that she is considering hurting herself. She's been repeatedly hitting her legs.   2. Psychosis. Danielle [name] has been maintained on Haldol for several years now. Apparently it stopped working and Danielle [name] started experiencing auditory hallucinations and paranoia again. Dr. Adele Schilder, her primary psychiatrist attempted to switch her to Abilify. At Danielle [name] did not have access to Abilify as of yet as it needed prior approval. She believes that it may be improved by now. There is an option of giving her Abilify maintenance injection. She is taking Abilify for psychosis here.   3. Mood. Danielle [name] has also been on Depakote. VPA level was 62, ammonia 69. Depakote was discontinued. She is on lactulose. Lithium was started for mood stabilization. Lithium level 0.7, BUN/creatinine are normal.  4. Insomnia. We will continue trazodone.  5. Hypothyroidism. We'll continue Synthroid 13mg po daily  6. Falls. Danielle [name] has a history of falls leading to multiple injuries. She fell in Danielle hospital several times. She fell over Danielle weekend again. She was placed with a one-to-one sitter. CT of Danielle head did show frontal lobe atrophy advanced for her age.   7. R/A arrhythmia. Cardiology consult completed. Awaiting Echo results. BP low today again.   8. Smoking. She is on nicotine patch.  9. Metabolic syndrome. Lipid panel is elevated. Hemoglobin A1c TSH are normal. Prolactin 39.1 in a [name] with a history of  recent Haldol treatment.    10. Low vitamin B12 and folate. We started both.   11. Headaches. She is on Topamax. Husband reports that she started falling when Topamax was increased from 100 to 200 mg. It is unlikely that she had Topamax for 20 years.  12. Disposition. She will be discharged to home with her family. She will follow up with Dr. Adele Schilder.  Orson Slick, MD 06/12/2015, 12:13 PM

## 2015-06-12 NOTE — Progress Notes (Signed)
Recreation Therapy Notes  Date: 02.06.17 Time: 3:00 pm Location: Craft Room  Group Topic: Wellness  Goal Area(s) Addresses:  Patient will identify at least one item per dimension of health. Patient will examine areas they are deficient in.  Behavioral Response: Attentive, Interactive  Intervention: 6 Dimensions of Health  Activity: Patients were given a sheets with the definitions of the 6 Dimensions of Health on it and a worksheet with the 6 Dimensions written out. Patients were instructed to write 2-3 things they are currently doing in each category.  Education: LRT educated patients on ways they can increase areas they are deficient in.  Education Outcome: In group clarification offered  Clinical Observations/Feedback: Patient completed activity by writing at least 1 item in each category with assistance from LRT. Patient contributed to group discussion by stating ways she can increase certain categories and how this activity is related to her admission.  Jacquelynn Cree, LRT/CTRS 06/12/2015 4:28 PM

## 2015-06-12 NOTE — Progress Notes (Signed)
Pt had a syncope episode at approximately 0230 when she woke up and needed to use the bathroom. Pt was able to ambulate with one assist. When sitting back down in the wheelchair Pt fainted. Vitals assessed and were WNL 109/77, 88 pulse; 18 Respirations; O2 100%. Pupils reactive to light. Episode lasted about 3 minutes. No injuries noted. Pt requested water and stated she had a headache that she rated a 6 on 0-10 pain scale. Ibuprofen administered. Charge nurse notified. Will continue to monitor.

## 2015-06-12 NOTE — Progress Notes (Signed)
D: Pt denies SI and AVH. Affect depressed. Two syncope episodes this evening (lasting about 5 minutes in duration each) with no fall. No injuries noted. Doctor and house supervisor notified. Vitals are WNL and Pt is A&O x 4. Appetite is good. Fluids provided. Pt able to ambulate to bathroom with assistance.  A: Medications given as prescribed. Sitter at bedside to maintain safety. Encouragement and support provided. Q15 minute checks maintained for safety.  R: Pt tearful after syncope episodes. Compliant with medications. Denies pain. Appetite fair. Currently resting with eyes closed. Will continue to monitor.

## 2015-06-12 NOTE — Progress Notes (Signed)
She is still depressed & anxious.Positive for AV hallucinations.Stated that she saw the shadow of people standing in the bath room & the voices tells that she is stupid.Attended groups after much encouragement.One "black out' episode during this shift.She woke up in 2 minutes.Compliant with medications.

## 2015-06-12 NOTE — [facility] Group Notes (Signed)
[facility] LCSW Aftercare Discharge Planning Group Note   06/12/2015 1:00 PM  Participation Quality:  Patient attended group and participated sharing that her SMART goal is to "get my medications straight so I can discharge". Patient left group stating it was too crowded for her.   Mood/Affect:  Depressed   Thoughts of Suicide:  Negative Will you contract for safety?   Yes  Current AVH:  No   Lulu Riding, MSW, [employer]

## 2015-06-13 ENCOUNTER — Inpatient Hospital Stay
Admission: EM | Admit: 2015-06-13 | Discharge: 2015-06-13 | Disposition: A | Discharge status: Home / Self Care | Payer: BLUE CROSS/BLUE SHIELD | Source: Intra-hospital | Attending: Psychiatry | Admitting: Psychiatry

## 2015-06-13 MED ORDER — OLANZAPINE 10 MG PO TABS
10.0000 mg | ORAL_TABLET | Freq: Every day | ORAL | Status: DC
  Administered 2015-06-13 – 2015-06-14 (×2): 10 mg via ORAL
  Filled 2015-06-13 (×2): qty 1

## 2015-06-13 NOTE — [facility] Group Notes (Signed)
[facility] LCSW Group Therapy  06/13/2015 3:24 PM  Type of Therapy:  Group Therapy  Participation Level:  Active  Participation Quality:  Attentive and Sharing  Affect:  Depressed  Cognitive:  Alert  Insight:  Improving  Engagement in Therapy:  Improving  Modes of Intervention:  Socialization and Support  Summary of Progress/Problems: Patient attended and participated in group discussion appropriately introducing herself and sharing that her self care activity at home is "reading and taking a bubble bath". Patient was attentive throughout group but was called out early by staff after 30 minutes of group.  Lulu Riding, MSW, LCSWA 06/13/2015, 3:24 PM

## 2015-06-13 NOTE — [facility] Group Notes (Signed)
[facility] Group Notes:  (Nursing/MHT/Case Management/Adjunct)  Date:  06/13/2015  Time:  2:15 PM  Type of Therapy:  Psychoeducational Skills  Participation Level:  Minimal  Participation Quality:  Appropriate  Affect:  Appropriate  Cognitive:  Appropriate  Insight:  Appropriate  Engagement in Group:  Supportive  Modes of Intervention:  Discussion and Education  Summary of Progress/Problems:  Marquette Old 06/13/2015, 2:15 PM

## 2015-06-13 NOTE — Progress Notes (Signed)
Patient visiting with husband and Tech reports patient  has brief time of head down and not responding with eyes closed. MD, nurse down to patient. Patient awake, no distress. Alert and calls out for husband Danielle [name]. MD and husband discuss plan of care. Safety maintained.

## 2015-06-13 NOTE — Progress Notes (Signed)
*  PRELIMINARY RESULTS* Echocardiogram 2D Echocardiogram has been performed.  Georgann Housekeeper Hege 06/13/2015, 11:51 AM

## 2015-06-13 NOTE — [facility] Group Notes (Signed)
[facility] Group Notes:  (Nursing/MHT/Case Management/Adjunct)  Date:  06/13/2015  Time:  10:43 PM  Type of Therapy:  Evening Wrap-up Group  Participation Level:  Minimal  Participation Quality:  Appropriate  Affect:  Appropriate  Cognitive:  Appropriate  Insight:  Improving  Engagement in Group:  Improving  Modes of Intervention:  Pt. wants to work on discharge.  Summary of Progress/Problems:  Danielle [name] 06/13/2015, 10:43 PM

## 2015-06-13 NOTE — Progress Notes (Signed)
[facility] MD Progress Note  06/13/2015 8:01 AM [name]  MRN:  [id number]  Subjective:  Danielle [name] sustained another fall this morning. She was in a wheelchair with one-to-one sitter but went limp and was assisted gently to the floor. She also urinated on herself. When she came to her senses she was very upset. Multiple falls are very distressing to her but the fact that she was incontinent put her in despair. She was consulted by cardiology and is awaiting cardiac echo this morning. The patient reportedly was investigated for seizure disorder by her primary neurologist in [place]. According to the patient seizures leveled out. I am not sure if she had EEG video monitoring. She feels depressed, hopeless and worthless. Her hallucinations have been improving and she only hears members. She had no somatic complaints. She participates in programming.   Principal Problem: Paranoid schizophrenia ([place]) Diagnosis:   Patient Active Problem List   Diagnosis Date Noted  . Tobacco use disorder [F17.200] 06/08/2015  . Hyperammonemia ([place]) [E72.20] 06/08/2015  . Paranoid schizophrenia ([place]) [F20.0]   . Migraine headache [G43.909] 06/04/2012  . Seizure ([place]) [R56.9] 06/04/2012  . Hypothyroidism [E03.9] 06/04/2012  . OSA (obstructive sleep apnea) [G47.33] 08/29/2011  . Post traumatic stress disorder (PTSD) [F43.10] 07/31/2011   Total Time spent with patient: 20 minutes  Past Psychiatric History:  schizophrenia.  Past Medical History:  Past Medical History  Diagnosis Date  . Depressed   . Seizures ([place])   . Heart murmur   . Hypothyroidism   . Anemia   . Headache(784.0)   . Anxiety   . PTSD (post-traumatic stress disorder)   . Shortness of breath   . Schizophrenia ([place])   . Bipolar 1 disorder [facility])     Past Surgical History  Procedure Laterality Date  . Tubal ligation  1996  . Foot surgery     Family History:  Family History  Problem Relation Age of Onset  . Adopted: Yes   Family  Psychiatric  History:  see H&P. Social History:  History  Alcohol Use No     History  Drug Use No    Social History   Social History  . Marital Status: Married    Spouse Name: N/A  . Number of Children: 2  . Years of Education: N/A   Occupational History  . unemployed    Social History Main Topics  . Smoking status: Former Smoker -- 1.50 packs/day for 30 years    Types: Cigarettes    Quit date: 09/20/2011  . Smokeless tobacco: Never Used  . Alcohol Use: No  . Drug Use: No  . Sexual Activity: Yes    Birth Control/ Protection: Surgical   Other Topics Concern  . None   Social History Narrative   Pt is adopted. Unsure of family history.   Additional Social History:                         Sleep: Fair  Appetite:  Fair  Current Medications: Current Facility-Administered Medications  Medication Dose Route Frequency Provider Last Rate Last Dose  . ARIPiprazole (ABILIFY) tablet 20 mg  20 mg Oral Daily Clovis Fredrickson, MD   20 mg at 06/12/15 0852  . aspirin EC tablet 81 mg  81 mg Oral Q breakfast Hildred Priest, MD   81 mg at 06/12/15 0851  . cyanocobalamin tablet 1,000 mcg  1,000 mcg Oral Daily Teagan Heidrick B Connery Shiffler, MD   1,000 mcg at  06/12/15 0211  . folic acid (FOLVITE) tablet 1 mg  1 mg Oral Daily Terril Chestnut B Mylin Gignac, MD   1 mg at 06/12/15 0851  . ibuprofen (ADVIL,MOTRIN) tablet 600 mg  600 mg Oral Q8H PRN Hildred Priest, MD   600 mg at 06/13/15 0137  . levothyroxine (SYNTHROID, LEVOTHROID) tablet 75 mcg  75 mcg Oral QAC breakfast Hildred Priest, MD   75 mcg at 06/13/15 1552  . lithium carbonate capsule 300 mg  300 mg Oral TID WC [name] Suchocki B Rodert Hinch, MD   300 mg at 06/12/15 1629  . LORazepam (ATIVAN) tablet 1 mg  1 mg Oral Q4H PRN Clovis Fredrickson, MD   1 mg at 06/12/15 2142  . magnesium hydroxide (MILK OF MAGNESIA) suspension 30 mL  30 mL Oral Daily PRN Hildred Priest, MD      . nicotine (NICODERM CQ -  dosed in mg/24 hours) patch 21 mg  21 mg Transdermal Daily PRN Hildred Priest, MD      . topiramate (TOPAMAX) tablet 100 mg  100 mg Oral QHS Clovis Fredrickson, MD   100 mg at 06/12/15 2140  . traZODone (DESYREL) tablet 100 mg  100 mg Oral QHS Hildred Priest, MD   100 mg at 06/12/15 2141    Lab Results:  Results for orders placed or performed during the hospital encounter of 06/07/15 (from the past 48 hour(s))  Basic metabolic panel     Status: Abnormal   Collection Time: 06/11/15  9:31 AM  Result Value Ref Range   Sodium 136 135 - 145 mmol/L   Potassium 4.4 3.5 - 5.1 mmol/L   Chloride 108 101 - 111 mmol/L   CO2 20 (L) 22 - 32 mmol/L   Glucose, Bld 107 (H) 65 - 99 mg/dL   BUN 14 6 - 20 mg/dL   Creatinine, Ser 0.80 0.44 - 1.00 mg/dL   Calcium 9.6 8.9 - 10.3 mg/dL   GFR calc non Af Amer >60 >60 mL/min   GFR calc Af Amer >60 >60 mL/min    Comment: (NOTE) The eGFR has been calculated using the CKD EPI equation. This calculation has not been validated in all clinical situations. eGFR's persistently <60 mL/min signify possible Chronic Kidney Disease.    Anion gap 8 5 - 15  CBC with Differential/Platelet     Status: None   Collection Time: 06/11/15  9:31 AM  Result Value Ref Range   WBC 8.0 3.6 - 11.0 K/uL   RBC 4.19 3.80 - 5.20 MIL/uL   Hemoglobin 13.1 12.0 - 16.0 g/dL   HCT 38.2 35.0 - 47.0 %   MCV 91.3 80.0 - 100.0 fL   MCH 31.3 26.0 - 34.0 pg   MCHC 34.3 32.0 - 36.0 g/dL   RDW 12.6 11.5 - 14.5 %   Platelets 242 150 - 440 K/uL   Neutrophils Relative % 67 %   Neutro Abs 5.3 1.4 - 6.5 K/uL   Lymphocytes Relative 24 %   Lymphs Abs 1.9 1.0 - 3.6 K/uL   Monocytes Relative 7 %   Monocytes Absolute 0.6 0.2 - 0.9 K/uL   Eosinophils Relative 1 %   Eosinophils Absolute 0.1 0 - 0.7 K/uL   Basophils Relative 1 %   Basophils Absolute 0.1 0 - 0.1 K/uL  Ammonia     Status: Abnormal   Collection Time: 06/11/15  9:31 AM  Result Value Ref Range   Ammonia 37 (H) 9  - 35 umol/L  Lithium level  Status: None   Collection Time: 06/12/15  6:38 AM  Result Value Ref Range   Lithium Lvl 0.70 0.60 - 1.20 mmol/L    Physical Findings: AIMS:  , ,  ,  ,    CIWA:    COWS:     Musculoskeletal: Strength & Muscle Tone: within normal limits Gait & Station: unsteady Patient leans: N/A  Psychiatric Specialty Exam: Review of Systems  Musculoskeletal: Positive for falls.  All other systems reviewed and are negative.   Blood pressure 95/58, pulse 72, temperature 99.2 F (37.3 C), temperature source Oral, resp. rate 18, height 5' 11"  (1.803 m), weight 94.348 kg (208 lb), SpO2 96 %.Body mass index is 29.02 kg/(m^2).  General Appearance: Fairly Groomed  Engineer, water::  Good  Speech:  Slurred  Volume:  Normal  Mood:  Anxious, Hopeless and Worthless  Affect:  Congruent  Thought Process:  Goal Directed  Orientation:  Full (Time, Place, and Person)  Thought Content:  Hallucinations: Auditory  Suicidal Thoughts:  Yes.  with intent/plan  Homicidal Thoughts:  No  Memory:  Immediate;   Fair Recent;   Fair Remote;   Fair  Judgement:  Fair  Insight:  Fair  Psychomotor Activity:  Normal  Concentration:  Fair  Recall:  [employer] of Knowledge:Fair  Language: Fair  Akathisia:  No  Handed:  Right  AIMS (if indicated):     Assets:  Communication Skills Desire for Improvement Financial Resources/Insurance Housing Intimacy Resilience Social Support  ADL's:  Intact  Cognition: WNL  Sleep:  Number of Hours: 5.5   Treatment Plan Summary: Daily contact with patient to assess and evaluate symptoms and progress in treatment and Medication management   Danielle [name] is a 45 year old female with a history of paranoid schizophrenia admitted for suicidal ideation and self-injurious behavior in the context of physical illness.  1. Suicidal ideation. The patient assures me that she does not want to die, yet she is so frustrated with frequent falls and inability to walk  that she is considering hurting herself. She's been repeatedly hitting her legs.   2. Psychosis. The patient had been maintained on Haldol for several years. This was recently switched to Abilify. We continue Abilify.   3. Mood. The patient has also been on Depakote. VPA level was 62, ammonia 69. Depakote was discontinued and Lithium started for mood stabilization. Lithium level 0.7, BUN/creatinine are normal.  4. Insomnia. We will continue trazodone.  5. Hypothyroidism. We'll continue Synthroid 54mg po daily  6. Falls. The patient has a history of falls leading to multiple injuries. She fell in the hospital several times. She fell over the weekend and this morning again. This time with incontinence. She has one-to-one sActuary CT of the head did show frontal lobe atrophy advanced for her age.If cardiology work up is negative will ask Neurology consult.   7. R/A arrhythmia. Cardiology consult completed. Awaiting Echo results. BP low today again.   8. Smoking. She is on nicotine patch.  9. Metabolic syndrome. Lipid panel is elevated. Hemoglobin A1c TSH are normal. Prolactin 39.1 in a patient with a history of recent Haldol treatment.   10. Low vitamin B12 and folate. We started both.   11. Headaches. She is on Topamax. Husband reports that she started falling when Topamax was increased from 100 to 200 mg.   12. PT. the patient is deconditioned. They recommended home PT and rolling walker.   13. Disposition. She will be discharged to home with her family. She  will follow up with Dr. Adele Schilder.  Orson Slick, MD 06/13/2015, 8:01 AM

## 2015-06-13 NOTE — Progress Notes (Signed)
D: Patient has stayed isolative to room. Very sad and depressed affect. States she sees a shadowy figure and has voices still present telling her that she's stupid. The only thoughts she said of hurting herself was "hitting my legs." C/o HA at a 7.5 A: Medication given with education. Encouragement provided. PRN pain medication given.  R: Patient was compliant with medications. She was remains calm and cooperative. Safety maintained with 1:1 sitter.

## 2015-06-13 NOTE — Progress Notes (Addendum)
Writer spoke with Dorene Sorrow for echocardiogram and Dorene Sorrow to come to unit to test patient and will notify writer when he is on his way. Patient remains with depressed affect, cooperative with meals, meds and plan of care. No SI/HI at this time. Patient is slightly demanding, slightly irritable. Annoyed writer asked her to take her meds prior to am therapy group. Ambulance person for her not being able to meet her daily goal of attending therapy groups today. Good adls this time. Endorses AH/VH at this time. Does not appear to be responding to internal stimuli. Remains 1:1 with staff rt history of falls. Using wheelchair and assist x1 with toileting and cares. Safety maintained at this time.

## 2015-06-13 NOTE — Progress Notes (Signed)
D: Pt +ve AVH- shadows and derogatory voices telling her she's worthless stupid and dumb . Pt is pleasant and cooperative. Pt was ordered Zyprexa 10 mg QHS due to increasing voices. Pt stated she was doing better and going to groups.   A: Pt was offered support and encouragement. Pt was given scheduled medications. Pt was encourage to attend groups. Q 15 minute checks were done for safety.   R: Pt is taking medication. Pt has no complaints.Pt receptive to treatment and safety maintained on unit.

## 2015-06-13 NOTE — Care Management Note (Signed)
Case Management Note  Patient Details  Name: Danielle [name] MRN: [id number] Date of Birth: 11-22-1970  Subjective/Objective:      [name] is a 45 year old female with a history of paranoid schizophrenia admitted for suicidal ideation and self-injurious behavior in the context of physical illness.       Pt with report of falls since last PT visit with no acute injury. Pt for echo this morning. Pt currently modified independent with bed mobility and transfers and has been using wheel chair on unit for pt safety due to falls. Pt able to ambulate with slow gait speed and reciprocal gait pattern with c/o dizziness after first 20'. Pt required seated rest break x2. Pt instructed in therex for LE strengthening. Cont with POC.                  Action/Plan: Follow Up Recommendations-   Home health PT        Home health PT    Equipment Recommendations-  Rolling walker with 5" wheels          Rolling walker with 5" wheels          Expected Discharge Date:   06/16/15               Expected Discharge Plan:   Home with Home Health Care and OP MH LOC  In-House Referral:   [facility]  Discharge planning Services     Post Acute Care Choice:   No preference Choice offered to:   [facility]  DME Arranged:    DME Agency:     HH Arranged:   Yes HH Agency:   [facility]  Status of Service:   Pending  Medicare Important Message Given:    Date Medicare IM Given:    Medicare IM give by:    Date Additional Medicare IM Given:    Additional Medicare Important Message give by:     If discussed at Long Length of Stay Meetings, dates discussed:    Additional Comments:  Georgena Spurling, RN 06/13/2015, 2:36 PM

## 2015-06-13 NOTE — Progress Notes (Signed)
Patient had another "black out" spell this morning while in wheelchair. While trying to awaken the patient she slid out of the wheelchair and was assisted into the floor. VS stable. Patient did not hit head. MD notified.

## 2015-06-13 NOTE — Plan of Care (Signed)
Problem: Ineffective individual coping Goal: STG: Patient will remain free from self harm Outcome: Progressing Pt safe on the unit at this time     

## 2015-06-13 NOTE — Plan of Care (Signed)
Problem: [facility] Participation in Recreation Therapeutic Interventions Goal: STG-Patient will demonstrate improved self esteem by identif STG: Self-Esteem - Within 4 treatment sessions, patient will verbalize at least 5 positive affirmation statements in each of 2 treatment sessions to increase self-esteem post d/c.  Outcome: Progressing Treatment Session 1; Completed 1 out of 2: At approximately 10:00 am, LRT met with patient in craft room. Patient verbalized 5 positive affirmation statements. Patient reported it felt "almost like a lie". LRT encouraged patient to continue saying positive affirmation statements. Intervention Used: I Am statements  Leonette Monarch, LRT/CTRS 02.07.17 11:35 am Goal: STG-Other Recreation Therapy Goal (Specify) STG: Stress Management - Within 4 treatment sessions, patient will verbalize understanding of the stress management techniques in each of 2 treatment sessions to increase stress management skills post d/c.  Outcome: Progressing Treatment Session 1; Completed 1 out of 2: At approximately 10:00 am, LRT met with patient in craft room. LRT educated and provided patient with handouts on stress management techniques. Patient verbalized understanding. LRT encouraged patient to read over and practice the stress management techniques. Intervention Used: Stress Management handouts  Leonette Monarch, LRT/CTRS 02.07.17 11:37 am

## 2015-06-13 NOTE — Progress Notes (Signed)
Physical Therapy Treatment Patient Details Name: Danielle [name] MRN: [id number] DOB: November 29, 1970 Today's Date: 06/13/2015    History of Present Illness [name] is a 45 year old female with a history of paranoid schizophrenia admitted for suicidal ideation and self-injurious behavior in the context of physical illness.    PT Comments    Pt with report of falls since last PT visit with no acute injury.  Pt for echo this morning.  Pt currently modified independent with bed mobility and transfers and has been using wheel chair on unit for pt safety due to falls.  Pt able to ambulate with slow gait speed and reciprocal gait pattern with c/o dizziness after first 20'.  Pt required seated rest break x2.  Pt instructed in therex for LE strengthening.  Cont with POC.   Follow Up Recommendations  Home health PT     Equipment Recommendations  Rolling walker with 5" wheels    Recommendations for Other Services       Precautions / Restrictions Precautions Precautions: Fall Precaution Comments: High Restrictions Weight Bearing Restrictions: No    Mobility  Bed Mobility Overal bed mobility: Modified Independent             General bed mobility comments: sup<>sit  Transfers Overall transfer level: Modified independent Equipment used: Rolling walker (2 wheeled)             General transfer comment: Slow rise from sitting to standing, uses B UE's to push down on RW, instructed to place hands on seated surface for safety.  Ambulation/Gait Ambulation/Gait assistance: Min guard Ambulation Distance (Feet): 80 Feet Assistive device: Rolling walker (2 wheeled) Gait Pattern/deviations: Step-through pattern;Shuffle     General Gait Details: Slow gait with heavy lean on RW.  Facial grimmacing during gait but unclear if pt in pain or tired.  Pt required 2 seated rest breaks x1 min due to dizziness.   Stairs            Wheelchair Mobility    Modified Rankin (Stroke Patients  Only)       Balance Overall balance assessment: Needs assistance Sitting-balance support: Feet supported Sitting balance-Leahy Scale: Good     Standing balance support: Bilateral upper extremity supported;During functional activity Standing balance-Leahy Scale: Good                      Cognition Arousal/Alertness: Awake/alert Behavior During Therapy: Flat affect Overall Cognitive Status: History of cognitive impairments - at baseline                      Exercises      General Comments        Pertinent Vitals/Pain Pain Assessment: 0-10 Pain Location: R lower leg Pain Intervention(s): Limited activity within patient's tolerance;Monitored during session    Home Living                      Prior Function            PT Goals (current goals can now be found in the care plan section) Acute Rehab PT Goals Patient Stated Goal: None stated. PT Goal Formulation: With patient Time For Goal Achievement: 06/23/15 Potential to Achieve Goals: Fair Progress towards PT goals: Progressing toward goals    Frequency  Min 2X/week    PT Plan Current plan remains appropriate    Co-evaluation             End of Session Equipment  Utilized During Treatment: Gait belt Activity Tolerance: Patient limited by fatigue Patient left: in bed     Time: 1255-1328 PT Time Calculation (min) (ACUTE ONLY): 33 min  Charges:  $Gait Training: 8-22 mins $Therapeutic Exercise: 8-22 mins                    G Codes:      Shaquina Gillham A Melita Villalona, PT 2015/07/11, 1:41 PM

## 2015-06-13 NOTE — Plan of Care (Signed)
Problem: Alteration in thought process Goal: STG-Patient is able to discuss thoughts with staff Outcome: Progressing Patient is able to describe to me the auditory and visual hallucinations that she's having.

## 2015-06-13 NOTE — Plan of Care (Signed)
Problem: Consults Goal: [facility] General Treatment Patient Education Outcome: Progressing Cooperative with meds and plan of care.      

## 2015-06-13 NOTE — Progress Notes (Signed)
Recreation Therapy Notes  Date: 02.07.17 Time: 3:00 pm Location: Craft Room  Group Topic: Self-expression  Goal Area(s) Addresses:  Patient will be able to identify a color that represents each emotion. Patient will verbalize benefit of using art as a means of self-expression. Patient will verbalize one emotion experienced while participating in activity.  Behavioral Response: Attentive, Interactive  Intervention: The Colors Within Me  Activity: Patients were given a blank face worksheet and instructed to analyze the emotions they were experiencing, pick a color for each emotion, and show on the face how much of that emotion they were experiencing.  Education: LRT educated patients on different forms of self-expression.  Education Outcome: Acknowledges education/In group clarification offered  Clinical Observations/Feedback: Patient completed activity by picking colors for each emotion she is experiencing and showing on the worksheet how much of that emotion she was experiencing. Patient contributed to group discussion by stating what emotions she was feeling, and that it was helpful to see her emotions on paper.  Jacquelynn Cree, LRT/CTRS 06/13/2015 4:18 PM

## 2015-06-13 NOTE — [facility] Group Notes (Signed)
[facility] Group Notes:  (Nursing/MHT/Case Management/Adjunct)  Date:  06/13/2015  Time:  4:08 AM  Type of Therapy:  Group Therapy  Participation Level:  Active  Participation Quality:  Appropriate  Affect:  Appropriate  Cognitive:  Appropriate  Insight:  Improving  Engagement in Group:  Developing/Improving  Modes of Intervention:  n/a  Summary of Progress/Problems: Pt was able to share goal, but needed time gathering thoughts.   Fanny Skates Bristal Steffy 06/13/2015, 4:08 AM

## 2015-06-13 NOTE — Progress Notes (Signed)
*  PRELIMINARY RESULTS* Echocardiogram 2D Echocardiogram has been performed.  Danielle [name] 06/13/2015, 11:50 AM

## 2015-06-14 NOTE — Progress Notes (Signed)
Recreation Therapy Notes  Date: 02.08.17 Time: 3:00 pm Location: Craft Room  Group Topic: Self-esteem  Goal Area(s) Addresses:  Patient will be able to identify benefit of self-esteem. Patient will be able to identify ways to increase self-esteem.  Behavioral Response: Attentive, Interactive  Intervention: Self-Portrait  Activity: Patients were instructed to draw their self-portrait of how they felt. Patients were then instructed to write their name on a piece of paper and a positive trait. Patient's passed the paper around the room and wrote positive traits about peers. Patient drew their self-portrait again after they read the positive traits.  Education:LRT educated patients on ways they can increase their self-esteem.  Education Outcome: In group clarification offered   Clinical Observations/Feedback: Patient completed activity by drawing both self-portraits and writing something positive about herself and the peers. Patient contributed to group discussion by stating how it felt to read the positive traits.  Jacquelynn Cree, LRT/CTRS 06/14/2015 5:52 PM

## 2015-06-14 NOTE — Tx Team (Signed)
Interdisciplinary Treatment Plan Update (Adult)  Date:  06/14/2015 Time Reviewed:  2:19 PM  Progress in Treatment: Attending groups: Intermittently Participating in groups:  Intermittently Taking medication as prescribed:  Yes. Tolerating medication:  Yes. Family/Significant othe contact made:  CSW spoke with the pt's husband Patient understands diagnosis:  Yes. Discussing patient identified problems/goals with staff:  Yes. Medical problems stabilized or resolved:  Yes. Denies suicidal/homicidal ideation: Yes. Issues/concerns per patient self-inventory:  Yes. Other:  New problem(s) identified: No, Describe:  NA  Discharge Plan or Barriers: Pt plans to return home and follow up with outpatient at [facility] for medication management and therapy and with [facility] for her medical hospital follow up  Reason for Continuation of Hospitalization: Depression Hallucinations Medication stabilization Suicidal ideation  Comments:The patient has a long history of schizophrenia. She has been in the care of Dr. Adele Schilder for several years now and relatively stable on a combination of Haldol, Depakote, and trazodone. Recently the patient started complaining of increased paranoia venting her from leaving the house and auditory hallucinations. Dr. Adele Schilder plan to taper the Haldol of and switch the patient to Abilify however the patient could not get her Abilify as of yet due to prior approval process. She became increasingly paranoid, depressed, hallucinating, and thinking of hurting herself. She admits that she does not want to die but has been very frustrated with no access to necessary medications and deterioration of her general health. For extended periods of time she has been unable to walk and has been falling frequently. She describes it as episodes of dizziness. She has been seen by neurologist rule out seizures. She is now under investigation children by cardiologist with a plan  to do Holter monitoring next week. The patient cannot function very well at home. Apparently she does not use a walker but walks along the walls. She's been falling frequently with head bumps and cuts. She is very frustrated because falls happen randomly in most embarrassing circumstances. She has been trying to hurt herself by hitting herself. As she is sitting in the wheelchair she started hitting her legs. She also experiences periods of confusion. She noticed difficulties with her speech. Indeed her speech is slurred. She reports many symptoms of depression with poor sleep, decreased appetite, anhedonia, feeling of guilt and hopelessness worthlessness, poor energy and concentration, social isolation, crying spells and passive suicidal ideation. She reports increased anxiety symptoms of PTSD type. She experiences more hallucinations and paranoia since Haldol dose was lowered. She denies any alcohol or illicit substance use.  Estimated length of stay: 7 days   New goal(s): NA  Review of initial/current patient goals per problem list:   1.  Goal(s): Patient will participate in aftercare plan * Met: No * Target date: at discharge * As evidenced by: Patient will participate within aftercare plan AEB aftercare provider and housing plan at discharge being identified. * 2/8: Pt plans to return home and follow up with outpatient at [facility] for medication management and therapy and with [facility] for her medical hospital follow up   2.  Goal (s): Patient will exhibit decreased depressive symptoms and suicidal ideations. * Met: No *  Target date: at discharge * As evidenced by: Patient will utilize self rating of depression at 3 or below and demonstrate decreased signs of depression or be deemed stable for discharge by MD. * 2/8: Goal progressing.  Pt denies SI/HI.  Pt reports she is safe for discharge. *  3.  Goal(s): Patient will demonstrate decreased signs and symptoms of  anxiety. * Met: No * Target date: at discharge * As evidenced by: Patient will utilize self rating of anxiety at 3 or below and demonstrated decreased signs of anxiety, or be deemed stable for discharge by MD * 2/8: Goal progressing. *  4.  Goal (s): Patient will demonstrate decreased symptoms of psychosis. * Met: No  *  Target date: at discharge * As evidenced by: Patient will not endorse signs of psychosis or be deemed stable for discharge by MD.  * 2/8: Goal progressing. *   Attendees: Patient:  Danielle [name] 2/8/20172:19 PM  Family:   2/8/20172:19 PM  Physician:   Dr. Bary Leriche  2/8/20172:19 PM  Nursing:   Elige Radon, RN 2/8/20172:19 PM  Case Manager:   2/8/20172:19 PM  Counselor:   2/8/20172:19 PM  Other:  Alphonse Guild , Keyser 2/8/20172:19 PM  Nursing:   Polly Cobia, RN 2/8/20172:19 PM  Nursing:   Carolynn Sayers, RN 2/8/20172:19 PM  Other:  2/8/20172:19 PM  Other:  2/8/20172:19 PM  Other:  2/8/20172:19 PM  Other:  2/8/20172:19 PM  Other:  2/8/20172:19 PM  Other:  2/8/20172:19 PM  Other:   2/8/20172:19 PM   Scribe for Treatment Team:   Alphonse Guild ,MSW, LCSWA  06/14/2015, 2:19 PM

## 2015-06-14 NOTE — [facility] Group Notes (Signed)
[facility] Group Notes:  (Nursing/MHT/Case Management/Adjunct)  Date:  06/14/2015  Time:  2:16 PM  Type of Therapy:  Psychoeducational Skills  Participation Level:  Did Not Attend   Lynelle Smoke [facility] 06/14/2015, 2:16 PM

## 2015-06-14 NOTE — Progress Notes (Signed)
[facility] MD Progress Note  06/14/2015 9:40 PM Danielle [name]  MRN:  [id number]  Subjective:  Danielle [name] is in great distress complaining of loud auditory hallucinations thatstarted last night. She was given Zyprexa in addition to Abilify.There were no "falls' today. She has 1:1 sitter. Sleep and appetite are good. Good group participation.  Principal Problem: Paranoid schizophrenia (HCC) Diagnosis:   Patient Active Problem List   Diagnosis Date Noted  . Tobacco use disorder [F17.200] 06/08/2015  . Hyperammonemia (HCC) [E72.20] 06/08/2015  . Paranoid schizophrenia (HCC) [F20.0]   . Migraine headache [G43.909] 06/04/2012  . Seizure (HCC) [R56.9] 06/04/2012  . Hypothyroidism [E03.9] 06/04/2012  . OSA (obstructive sleep apnea) [G47.33] 08/29/2011  . Post traumatic stress disorder (PTSD) [F43.10] 07/31/2011   Total Time spent with patient: 20 minutes  Past Psychiatric History: schizophrenia.  Past Medical History:  Past Medical History  Diagnosis Date  . Depressed   . Seizures (HCC)   . Heart murmur   . Hypothyroidism   . Anemia   . Headache(784.0)   . Anxiety   . PTSD (post-traumatic stress disorder)   . Shortness of breath   . Schizophrenia (HCC)   . Bipolar 1 disorder [facility])     Past Surgical History  Procedure Laterality Date  . Tubal ligation  1996  . Foot surgery     Family History:  Family History  Problem Relation Age of Onset  . Adopted: Yes   Family Psychiatric  History: See H&P. Social History:  History  Alcohol Use No     History  Drug Use No    Social History   Social History  . Marital Status: Married    Spouse Name: N/A  . Number of Children: 2  . Years of Education: N/A   Occupational History  . unemployed    Social History Main Topics  . Smoking status: Former Smoker -- 1.50 packs/day for 30 years    Types: Cigarettes    Quit date: 09/20/2011  . Smokeless tobacco: Never Used  . Alcohol Use: No  . Drug Use: No  . Sexual Activity: Yes   Birth Control/ Protection: Surgical   Other Topics Concern  . None   Social History Narrative   Pt is adopted. Unsure of family history.   Additional Social History:                         Sleep: Fair  Appetite:  Fair  Current Medications: Current Facility-Administered Medications  Medication Dose Route Frequency Provider Last Rate Last Dose  . ARIPiprazole (ABILIFY) tablet 20 mg  20 mg Oral Daily Shari Prows, MD   20 mg at 06/14/15 0905  . aspirin EC tablet 81 mg  81 mg Oral Q breakfast Jimmy Footman, MD   81 mg at 06/14/15 0905  . cyanocobalamin tablet 1,000 mcg  1,000 mcg Oral Daily Shari Prows, MD   1,000 mcg at 06/14/15 0907  . folic acid (FOLVITE) tablet 1 mg  1 mg Oral Daily Shari Prows, MD   1 mg at 06/14/15 0905  . ibuprofen (ADVIL,MOTRIN) tablet 600 mg  600 mg Oral Q8H PRN Jimmy Footman, MD   600 mg at 06/14/15 0649  . levothyroxine (SYNTHROID, LEVOTHROID) tablet 75 mcg  75 mcg Oral QAC breakfast Jimmy Footman, MD   75 mcg at 06/14/15 [phone]  . lithium carbonate capsule 300 mg  300 mg Oral TID WC [facility] Prows, MD   300  mg at 06/14/15 1633  . LORazepam (ATIVAN) tablet 1 mg  1 mg Oral Q4H PRN Shari Prows, MD   1 mg at 06/14/15 2133  . magnesium hydroxide (MILK OF MAGNESIA) suspension 30 mL  30 mL Oral Daily PRN Jimmy Footman, MD      . nicotine (NICODERM CQ - dosed in mg/24 hours) patch 21 mg  21 mg Transdermal Daily PRN Jimmy Footman, MD      . OLANZapine (ZYPREXA) tablet 10 mg  10 mg Oral QHS Shari Prows, MD   10 mg at 06/14/15 2133  . topiramate (TOPAMAX) tablet 100 mg  100 mg Oral QHS Shari Prows, MD   100 mg at 06/14/15 2133  . traZODone (DESYREL) tablet 100 mg  100 mg Oral QHS Jimmy Footman, MD   100 mg at 06/14/15 2135    Lab Results: No results found for this or any previous visit (from the past 48 hour(s)).  Physical  Findings: AIMS:  , ,  ,  ,    CIWA:    COWS:     Musculoskeletal: Strength & Muscle Tone: within normal limits Gait & Station: normal Patient leans: N/A  Psychiatric Specialty Exam: Review of Systems  Musculoskeletal: Positive for falls.  All other systems reviewed and are negative.   Blood pressure 138/94, pulse 117, temperature 98.3 F (36.8 C), temperature source Oral, resp. rate 20, height 5\' 11"  (1.803 m), weight 94.348 kg (208 lb), SpO2 100 %.Body mass index is 29.02 kg/(m^2).  General Appearance: Fairly Groomed  Patent attorney::  Good  Speech:  Slurred  Volume:  Normal  Mood:  Depressed  Affect:  Blunt  Thought Process:  Goal Directed  Orientation:  Full (Time, Place, and Person)  Thought Content:  Hallucinations: Auditory  Suicidal Thoughts:  Yes.  with intent/plan  Homicidal Thoughts:  No  Memory:  Immediate;   Fair Recent;   Fair Remote;   Fair  Judgement:  Fair  Insight:  Fair  Psychomotor Activity:  Normal  Concentration:  Fair  Recall:  [employer] of Knowledge:Fair  Language: Fair  Akathisia:  No  Handed:  Right  AIMS (if indicated):     Assets:  Communication Skills Desire for Improvement Financial Resources/Insurance Housing Intimacy Resilience Social Support  ADL's:  Intact  Cognition: WNL  Sleep:  Number of Hours: 6.15   Treatment Plan Summary: Daily contact with patient to assess and evaluate symptoms and progress in treatment and Medication management   Danielle [name] is a 45 year old female with a history of paranoid schizophrenia admitted for suicidal ideation and self-injurious behavior in the context of physical illness.  1. Suicidal ideation. The patient assures me that she does not want to die, yet she is so frustrated with frequent falls and inability to walk that she is considering hurting herself. She's been repeatedly hitting her legs.   2. Psychosis. The patient had been maintained on Haldol for several years. This was recently switched  to Abilify. We continue Abilify but added Zyprexa 10 mg nightly.  3. Mood. The patient has also been on Depakote. VPA level was 62, ammonia 69. Depakote was discontinued and Lithium started for mood stabilization. Lithium level 0.7, BUN/creatinine are normal.  4. Insomnia. We will continue trazodone.  5. Hypothyroidism. We'll continue Synthroid po daily  6. Falls. The patient has a history of falls leading to multiple injuries. She fell in the hospital several times. She fell over the weekend and this morning again. This  time with incontinence. She has one-to-one Comptroller. 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   7. R/A arrhythmia. Cardiology consult is greatly appreciated. Cardiac Echo is unremarkable.    8. Smoking. She is on nicotine patch.  9. Metabolic syndrome. Lipid panel is elevated. Hemoglobin A1c TSH are normal. Prolactin 39.1 in a patient with a history of recent Haldol treatment.   10. Low vitamin B12 and folate. We started both.   11. Headaches. She is on Topamax. Husband reports that she started falling when Topamax was increased from 100 to 200 mg.   12. PT. the patient is deconditioned. They recommended home PT and rolling walker.   13. R/O nacolepsy. Will ask neurology consult.  14. Disposition. She will be discharged to home with her family. She will follow up with Dr. Lolly Mustache.   Kristine Linea, MD 06/14/2015, 9:40 PM

## 2015-06-14 NOTE — [facility] Group Notes (Signed)
[facility] LCSW Group Therapy  06/14/2015 3:25 PM  Type of Therapy:  Group Therapy  Participation Level:  Minimal  Participation Quality:  Attentive  Affect:  Depressed  Cognitive:  Alert and Oriented  Insight:  Improving  Engagement in Therapy:  Improving  Modes of Intervention:  Socialization and Support  Summary of Progress/Problems: Patient arrived 15 minutes late and did not get to introduce herself but was attentive throughout group discussion. Patient is not comfortable in crowds and did not participate but did stay throughout group session.   Lulu Riding, MSW, LCSWA 06/14/2015, 3:25 PM

## 2015-06-14 NOTE — [facility] Group Notes (Signed)
[facility] LCSW Aftercare Discharge Planning Group Note   06/14/2015 2:52 PM  Participation Quality:  Patient attended and participated in group discussion introducing herself and sharing her SMART goal is to "get my medications back on track".   Mood/Affect:  Depressed  Depression Rating:  5  Anxiety Rating:  10  Thoughts of Suicide:  Negative Will you contract for safety?   Yes  Current AVH:  No  Plan for Discharge/Comments:  Discharge home with family and follow up with outpatient provider  Transportation Means: family to pick up  Supports:family, outpatient provider  Lulu Riding, MSW, LCSWA

## 2015-06-14 NOTE — Plan of Care (Signed)
Problem: Consults Goal: [facility] Treatment Patient Education Outcome: Progressing Cooperative with meds and plan of care. Encouraged to rest when not feeling good.

## 2015-06-15 MED ORDER — OLANZAPINE 10 MG PO TABS
20.0000 mg | ORAL_TABLET | Freq: Every day | ORAL | Status: DC
  Administered 2015-06-15 – 2015-06-18 (×4): 20 mg via ORAL
  Filled 2015-06-15 (×4): qty 2

## 2015-06-15 MED ORDER — TOPIRAMATE 100 MG PO TABS
200.0000 mg | ORAL_TABLET | Freq: Every day | ORAL | Status: DC
  Administered 2015-06-15 – 2015-06-18 (×4): 200 mg via ORAL
  Filled 2015-06-15 (×10): qty 2

## 2015-06-15 NOTE — Progress Notes (Signed)
Patient with appropriate affect, cooperative behavior with meals, meds and plan of care. No SI/HI/AVH at this time. Remains 1:1 with staff, remains on falls risk. Safety maintained at this time. Rests in bed during free time. Attends therapy groups, eats meals in dayroom. Minimal interaction with peers. Verbalizes needs appropriately with staff.

## 2015-06-15 NOTE — Plan of Care (Signed)
Problem: Alteration in mood Goal: LTG-Patient reports reduction in suicidal thoughts (Patient reports reduction in suicidal thoughts and is able to verbalize a safety plan for whenever patient is feeling suicidal)  Outcome: Not Progressing Pt SI-contracts for safety     

## 2015-06-15 NOTE — Progress Notes (Signed)
Recreation Therapy Notes  Date: 02.09.17 Time: 3:00 pm Location: Craft Room  Group Topic: Leisure Education  Goal Area(s) Addresses:  Patient will identify activities for each letter of the alphabet. Patient will verbalize ability to integrate positive leisure into life post d/c. Patient will verbalize ability to use leisure as a Associate Professor.  Behavioral Response: Attentive, Interactive  Intervention: Leisure Alphabet  Activity: Patients were given a Leisure Information systems manager and instructed to write healthy leisure activities for each letter of the alphabet.  Education: LRT educated patients on what they need to participate in leisure.  Education Outcome: Acknowledges education/In group clarification offered  Clinical Observations/Feedback: Patient completed activity by writing healthy leisure activities. Patient contributed to group discussion by stating healthy leisure activities.   Jacquelynn Cree, LRT/CTRS 06/15/2015 4:19 PM

## 2015-06-15 NOTE — Progress Notes (Signed)
D: Pt SI/AVH, denies HI. Pt is pleasant and cooperative. Pt stated she was a little better, but scared about falling out.   A: Pt was offered support and encouragement. Pt was given scheduled medications. Pt was encourage to attend groups. Q 15 minute checks were done for safety.   R:Pt attends groups and interacts well with peers and staff. Pt is taking medication. Pt has no complaints.Pt receptive to treatment and safety maintained on unit.

## 2015-06-15 NOTE — Progress Notes (Signed)
Patient visiting with husband at this time. Writer called to dayroom, staff stating patient's head and shoulders are slumped over. Patient easily aroused when Teacher, early years/pre begin to wheel patient to room. Patient clinging on to husband, crying, kissing him, and hugging him. Limit set as patient is scaring and making peers and other visitors scared. Patient to room to compose self. Safety maintained.

## 2015-06-15 NOTE — Progress Notes (Signed)
[facility] MD Progress Note  06/15/2015 12:59 PM Danielle [name]  MRN:  [id number]  Subjective:  Danielle [name] was feeling better today. Auditory hallucinations are improving with additional Zyprexa. She is not paranoid. Mood is improving, affect is brighter. She smiled for the first time today. She complains of daily headache that resolves with Motrin. She is on Topamax for headache prevention reports that headaches are less frequent and severe but she still gets migraines. She is allergic to Imitrex. She had another fainting episodes today with urinary incontinence. This is the second time she was incontinent. According to the patient and the husband it never happened before. I spoke with Dr. Thad Ranger our neurologist. She agreed that the patient was thoroughly investigated for seizure disorder and narcolepsy. Both conditions were ruled out by her outpatient neurologist in [facility]. Fllowing discharge she will undergo tentatively Holter monitoring.   Principal Problem: Paranoid schizophrenia (HCC) Diagnosis:   Patient Active Problem List   Diagnosis Date Noted  . Tobacco use disorder [F17.200] 06/08/2015  . Hyperammonemia (HCC) [E72.20] 06/08/2015  . Paranoid schizophrenia (HCC) [F20.0]   . Migraine headache [G43.909] 06/04/2012  . Seizure (HCC) [R56.9] 06/04/2012  . Hypothyroidism [E03.9] 06/04/2012  . OSA (obstructive sleep apnea) [G47.33] 08/29/2011  . Post traumatic stress disorder (PTSD) [F43.10] 07/31/2011   Total Time spent with patient: 20 minutes  Past Psychiatric History: Schizophrenia.  Past Medical History:  Past Medical History  Diagnosis Date  . Depressed   . Seizures (HCC)   . Heart murmur   . Hypothyroidism   . Anemia   . Headache(784.0)   . Anxiety   . PTSD (post-traumatic stress disorder)   . Shortness of breath   . Schizophrenia (HCC)   . Bipolar 1 disorder [facility])     Past Surgical History  Procedure Laterality Date  . Tubal ligation  1996  . Foot surgery      Family History:  Family History  Problem Relation Age of Onset  . Adopted: Yes   Family Psychiatric  History: See H&P. Social History:  History  Alcohol Use No     History  Drug Use No    Social History   Social History  . Marital Status: Married    Spouse Name: N/A  . Number of Children: 2  . Years of Education: N/A   Occupational History  . unemployed    Social History Main Topics  . Smoking status: Former Smoker -- 1.50 packs/day for 30 years    Types: Cigarettes    Quit date: 09/20/2011  . Smokeless tobacco: Never Used  . Alcohol Use: No  . Drug Use: No  . Sexual Activity: Yes    Birth Control/ Protection: Surgical   Other Topics Concern  . None   Social History Narrative   Pt is adopted. Unsure of family history.   Additional Social History:                         Sleep: Fair  Appetite:  Fair  Current Medications: Current Facility-Administered Medications  Medication Dose Route Frequency Provider Last Rate Last Dose  . ARIPiprazole (ABILIFY) tablet 20 mg  20 mg Oral Daily Tegan Britain B Majour Frei, MD   20 mg at 06/15/15 0945  . aspirin EC tablet 81 mg  81 mg Oral Q breakfast Jimmy Footman, MD   81 mg at 06/15/15 0946  . cyanocobalamin tablet 1,000 mcg  1,000 mcg Oral Daily Shari Prows, MD  1,000 mcg at 06/15/15 0946  . folic acid (FOLVITE) tablet 1 mg  1 mg Oral Daily Miliana Gangwer B Wilborn Membreno, MD   1 mg at 06/15/15 0946  . ibuprofen (ADVIL,MOTRIN) tablet 600 mg  600 mg Oral Q8H PRN Jimmy Footman, MD   600 mg at 06/15/15 0946  . levothyroxine (SYNTHROID, LEVOTHROID) tablet 75 mcg  75 mcg Oral QAC breakfast Jimmy Footman, MD   75 mcg at 06/15/15 0708  . lithium carbonate capsule 300 mg  300 mg Oral TID WC Rajvi Armentor B Taniqua Issa, MD   300 mg at 06/15/15 1201  . LORazepam (ATIVAN) tablet 1 mg  1 mg Oral Q4H PRN Shari Prows, MD   1 mg at 06/15/15 1201  . magnesium hydroxide (MILK OF MAGNESIA)  suspension 30 mL  30 mL Oral Daily PRN Jimmy Footman, MD      . nicotine (NICODERM CQ - dosed in mg/24 hours) patch 21 mg  21 mg Transdermal Daily PRN Jimmy Footman, MD      . OLANZapine (ZYPREXA) tablet 20 mg  20 mg Oral QHS Ardeth Repetto B Andrew Blasius, MD      . topiramate (TOPAMAX) tablet 200 mg  200 mg Oral QHS Isebella Upshur B Dashiell Franchino, MD      . traZODone (DESYREL) tablet 100 mg  100 mg Oral QHS Jimmy Footman, MD   100 mg at 06/14/15 2135    Lab Results: No results found for this or any previous visit (from the past 48 hour(s)).  Physical Findings: AIMS:  , ,  ,  ,    CIWA:    COWS:     Musculoskeletal: Strength & Muscle Tone: within normal limits and flaccid Gait & Station: unsteady Patient leans: N/A  Psychiatric Specialty Exam: Review of Systems  Musculoskeletal: Positive for falls.  All other systems reviewed and are negative.   Blood pressure 108/72, pulse 84, temperature 98.6 F (37 C), temperature source Oral, resp. rate 18, height 5\' 11"  (1.803 m), weight 94.348 kg (208 lb), SpO2 100 %.Body mass index is 29.02 kg/(m^2).  General Appearance: Casual  Eye Contact::  Good  Speech:  Clear and Coherent  Volume:  Normal  Mood:  Euthymic  Affect:  Appropriate  Thought Process:  Goal Directed  Orientation:  Full (Time, Place, and Person)  Thought Content:  Hallucinations: Auditory  Suicidal Thoughts:  No  Homicidal Thoughts:  No  Memory:  Immediate;   Fair Recent;   Fair Remote;   Fair  Judgement:  Fair  Insight:  Fair  Psychomotor Activity:  Normal  Concentration:  Fair  Recall:  [employer] of Knowledge:Fair  Language: Fair  Akathisia:  No  Handed:  Right  AIMS (if indicated):     Assets:  Communication Skills Desire for Improvement Financial Resources/Insurance Housing Intimacy Resilience Social Support  ADL's:  Intact  Cognition: WNL  Sleep:  Number of Hours: 6.45   Treatment Plan Summary: Daily contact with patient to  assess and evaluate symptoms and progress in treatment and Medication management   Danielle [name] is a 45 year old female with a history of paranoid schizophrenia admitted for suicidal ideation and self-injurious behavior in the context of physical illness.  1. Suicidal ideation. The patient assures me that she does not want to die, yet she is so frustrated with frequent falls and inability to walk that she is considering hurting herself. She's been repeatedly hitting her legs.   2. Psychosis. The patient had been maintained on Haldol for several years. This was recently  switched to Abilify. We continued Abilify but added Zyprexa. I will increase Zyprexa to 20 mg at bedtime. The patient insists on keeping Abilify.   3. Mood. The patient has also been on Depakote. VPA level was 62, ammonia 69. Depakote was discontinued and Lithium started for mood stabilization. Lithium level 0.7, BUN/creatinine are normal.  4. Insomnia. We will continue trazodone.  5. Hypothyroidism. We'll continue Synthroid po daily  6. Falls. The patient has a history of falls leading to multiple injuries. She fell in the hospital several times. She fell over the weekend and this morning again. This time with incontinence. She has one-to-one Comptroller. 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 She awaits 30-day Holter monitoring after discharge.   7. R/A arrhythmia. Cardiology consult is greatly appreciated. Cardiac Echo is unremarkable.   8. Smoking. She is on nicotine patch.  9. Metabolic syndrome. Lipid panel is elevated. Hemoglobin A1c TSH are normal. Prolactin 39.1 in a patient with a history of recent Haldol treatment.   10. Low vitamin B12 and folate. We started both.   11. Headaches. She is on Topamax. We will increase it to 200 mg.    12. PT. the patient is deconditioned. They recommended home PT and  rolling walker.   13. R/O nacolepsy. Will ask neurology consult.  14. Disposition. She will be discharged to home with her family. She will follow up with Dr. Lolly Mustache.    Kristine Linea, MD 06/15/2015, 12:59 PM

## 2015-06-15 NOTE — Plan of Care (Signed)
Problem: [facility] Participation in Recreation Therapeutic Interventions Goal: STG-Patient will demonstrate improved self esteem by identif STG: Self-Esteem - Within 4 treatment sessions, patient will verbalize at least 5 positive affirmation statements in each of 2 treatment sessions to increase self-esteem post d/c.  Outcome: Completed/Met Date Met:  06/15/15 Treatment Session 2; Completed 2 out of 2: At approximately 1:25 pm, LRT met with patient in patient room. Patient verbalized 5 positive affirmation statements. Patient reported it felt "good". LRT encouraged patient to continue saying positive affirmation statements. Intervention Used: I Am statements  Leonette Monarch, LRT/CTRS 02.09.17 2:22 pm Goal: STG-Other Recreation Therapy Goal (Specify) STG: Stress Management - Within 4 treatment sessions, patient will verbalize understanding of the stress management techniques in each of 2 treatment sessions to increase stress management skills post d/c.  Outcome: Completed/Met Date Met:  06/15/15 Treatment Session 2; Completed 2 out of 2: At approximately 1:25 pm, LRT met with patient in patient room. Patient reported she read over some of the stress management techniques. Patient verbalized understanding. LRT encouraged patient to practice the stress management techniques. Patient requested for enlarged print stress management techniques. LRT provided patient with them. Intervention Used: Stress Management handouts  Leonette Monarch, LRT/CTRS 02.09.17 2:24 pm

## 2015-06-15 NOTE — [facility] Group Notes (Signed)
[facility] LCSW Group Therapy  06/15/2015 4:31 PM  Type of Therapy:  Group Therapy  Participation Level:  Active  Participation Quality:  Appropriate and Attentive  Affect:  Appropriate  Cognitive:  Alert, Appropriate and Oriented  Insight:  Improving  Engagement in Therapy:  Improving  Modes of Intervention:  Socialization and Support  Summary of Progress/Problems: Patient attended group and participated appropriately introducing herself and sharing that her dream vacation would be "[place] the wildlife". Patient was attentive throughout group discussion and participated sharing that her medication is out of balance and was able to identify with other group members is feelings of depression.   Lulu Riding, MSW, LCSWA 06/15/2015, 4:31 PM

## 2015-06-15 NOTE — Plan of Care (Signed)
Problem: Ineffective individual coping Goal: STG: Patient will remain free from self harm Outcome: Progressing Attending therapy and safety maintained.

## 2015-06-16 LAB — VALPROIC ACID LEVEL, FREE: VALPROIC ACID FREE: 12.7 ug/mL (ref 6.0–22.0)

## 2015-06-16 MED ORDER — CYANOCOBALAMIN 1000 MCG PO TABS: 1000.0000 ug | ORAL_TABLET | Freq: Every day | ORAL | Status: DC

## 2015-06-16 MED ORDER — LITHIUM CARBONATE 300 MG PO CAPS: 300.0000 mg | ORAL_CAPSULE | Freq: Three times a day (TID) | ORAL | Status: DC

## 2015-06-16 MED ORDER — TRAZODONE HCL 100 MG PO TABS: 100.0000 mg | ORAL_TABLET | Freq: Every day | ORAL | Status: DC

## 2015-06-16 MED ORDER — HYDROXYZINE HCL 25 MG PO TABS
25.0000 mg | ORAL_TABLET | Freq: Three times a day (TID) | ORAL | Status: DC | PRN
  Administered 2015-06-16 – 2015-06-18 (×5): 25 mg via ORAL
  Filled 2015-06-16 (×5): qty 1

## 2015-06-16 MED ORDER — FOLIC ACID 1 MG PO TABS: 1.0000 mg | ORAL_TABLET | Freq: Every day | ORAL | Status: DC

## 2015-06-16 MED ORDER — LEVOTHYROXINE SODIUM 75 MCG PO TABS: 75.0000 ug | ORAL_TABLET | Freq: Every day | ORAL | Status: DC

## 2015-06-16 MED ORDER — ARIPIPRAZOLE 20 MG PO TABS: 20.0000 mg | ORAL_TABLET | Freq: Every day | ORAL | Status: DC

## 2015-06-16 MED ORDER — OLANZAPINE 20 MG PO TABS: 20.0000 mg | ORAL_TABLET | Freq: Every day | ORAL | Status: DC

## 2015-06-16 MED ORDER — TOPIRAMATE 200 MG PO TABS: 200.0000 mg | ORAL_TABLET | Freq: Every day | ORAL | Status: DC

## 2015-06-16 MED ORDER — HYDROXYZINE HCL 25 MG PO TABS: 25.0000 mg | ORAL_TABLET | Freq: Three times a day (TID) | ORAL | Status: DC | PRN

## 2015-06-16 MED ORDER — IBUPROFEN 800 MG PO TABS: 800.0000 mg | ORAL_TABLET | Freq: Three times a day (TID) | ORAL | Status: DC | PRN

## 2015-06-16 MED ORDER — ASPIRIN EC 81 MG PO TBEC: 81.0000 mg | DELAYED_RELEASE_TABLET | Freq: Every day | ORAL | Status: DC

## 2015-06-16 NOTE — Progress Notes (Signed)
NUTRITION NOTE:   Danielle [name] is a 45 y.o. female with Paranoid schizophrenia (HCC). Pt triggered for assessment due to LOS  PMH:  Past Medical History  Diagnosis Date  . Depressed   . Seizures (HCC)   . Heart murmur   . Hypothyroidism   . Anemia   . Headache(784.0)   . Anxiety   . PTSD (post-traumatic stress disorder)   . Shortness of breath   . Schizophrenia (HCC)   . Bipolar 1 disorder (HCC)     Diet Order: Regular  Current Nutrition: recorded po intake >95% of meals on average  Anthropometrics:  Body mass index is 29.02 kg/(m^2).   Pt is at no nutrition risk due to diagnosis/current problem, BMI of 29, Regular diet order, >95% po intake. No nutrition intervention warranted at this time. Will sign off. Please re-consult RD if nutritional issues arise.   Romelle Starcher MS, RD, LDN [phone] Pager  [phone] Weekend/On-Call Pager

## 2015-06-16 NOTE — [facility] Group Notes (Signed)
[facility] Group Notes:  (Nursing/MHT/Case Management/Adjunct)  Date:  06/16/2015  Time:  12:31 PM  Type of Therapy:  Group Therapy  Participation Level:  Active  Participation Quality:  Appropriate and Attentive  Affect:  Appropriate  Cognitive:  Alert, Appropriate and Oriented  Insight:  Appropriate  Engagement in Group:  Engaged  Modes of Intervention:  Activity  Summary of Progress/Problems:  Rosemarie Galvis De'Chelle Aithan Farrelly 06/16/2015, 12:31 PM

## 2015-06-16 NOTE — Progress Notes (Signed)
[facility] MD Progress Note  06/16/2015 11:15 AM Danielle [name]  MRN:  [id number]  Subjective:  Danielle [name] is a 45 year old female with a history of schizophrenia admitted for worsening of psychosis in the context of recent medication changes by her primary psychiatrist. She is currently on 2 antipsychotics Abilify started by her psychiatrist in the community and Zyprexa and recently when the patient started complaining of worsening of auditory hallucinations and commands. For the past 20 years she has had "fainting episodes". They are brief spells during which the patient seems to lose consciousness and goes limp. She urinated twice in the hospital during such episodes. She had extensive neurological and cardiac evaluation in the community. No cause for her spells was established. Seizure disorder, narcolepsy and his cardiac problems with rule out. She will have 30 day Holter monitoring in the community following discharge.   Danielle [name] continues to hallucinate but the voices are now at "more manageable level". He still feels very anxious and paced Ativan at least twice daily. Yesterday she had a fainting episode with her husband was around and another one when she got upset. Sleep and appetite are okay. There are no somatic symptoms. She is asking for a different medication for anxiety for what she describes are most likely panic attacks. She does look more relaxed. She still has sitter or safety and uses wheelchair. There are no somatic complaints. She tolerates medications well. Good group participation.  Principal Problem: Paranoid schizophrenia (HCC) Diagnosis:   Patient Active Problem List   Diagnosis Date Noted  . Tobacco use disorder [F17.200] 06/08/2015  . Hyperammonemia (HCC) [E72.20] 06/08/2015  . Paranoid schizophrenia (HCC) [F20.0]   . Migraine headache [G43.909] 06/04/2012  . Seizure (HCC) [R56.9] 06/04/2012  . Hypothyroidism [E03.9] 06/04/2012  . OSA (obstructive sleep apnea) [G47.33]  08/29/2011  . Post traumatic stress disorder (PTSD) [F43.10] 07/31/2011   Total Time spent with patient: 20 minutes  Past Psychiatric History: Schizophrenia.  Past Medical History:  Past Medical History  Diagnosis Date  . Depressed   . Seizures (HCC)   . Heart murmur   . Hypothyroidism   . Anemia   . Headache(784.0)   . Anxiety   . PTSD (post-traumatic stress disorder)   . Shortness of breath   . Schizophrenia (HCC)   . Bipolar 1 disorder [facility])     Past Surgical History  Procedure Laterality Date  . Tubal ligation  1996  . Foot surgery     Family History:  Family History  Problem Relation Age of Onset  . Adopted: Yes   Family Psychiatric  History: None reported. Social History:  History  Alcohol Use No     History  Drug Use No    Social History   Social History  . Marital Status: Married    Spouse Name: N/A  . Number of Children: 2  . Years of Education: N/A   Occupational History  . unemployed    Social History Main Topics  . Smoking status: Former Smoker -- 1.50 packs/day for 30 years    Types: Cigarettes    Quit date: 09/20/2011  . Smokeless tobacco: Never Used  . Alcohol Use: No  . Drug Use: No  . Sexual Activity: Yes    Birth Control/ Protection: Surgical   Other Topics Concern  . None   Social History Narrative   Pt is adopted. Unsure of family history.   Additional Social History:  Sleep: Fair  Appetite:  Fair  Current Medications: Current Facility-Administered Medications  Medication Dose Route Frequency Provider Last Rate Last Dose  . ARIPiprazole (ABILIFY) tablet 20 mg  20 mg Oral Daily Shari Prows, MD   20 mg at 06/16/15 0839  . aspirin EC tablet 81 mg  81 mg Oral Q breakfast Jimmy Footman, MD   81 mg at 06/16/15 0839  . cyanocobalamin tablet 1,000 mcg  1,000 mcg Oral Daily Shari Prows, MD   1,000 mcg at 06/16/15 0839  . folic acid (FOLVITE) tablet 1 mg  1 mg Oral  Daily Adeliz Tonkinson B Ahja Martello, MD   1 mg at 06/16/15 0839  . ibuprofen (ADVIL,MOTRIN) tablet 600 mg  600 mg Oral Q8H PRN Jimmy Footman, MD   600 mg at 06/16/15 0202  . levothyroxine (SYNTHROID, LEVOTHROID) tablet 75 mcg  75 mcg Oral QAC breakfast Jimmy Footman, MD   75 mcg at 06/16/15 0656  . lithium carbonate capsule 300 mg  300 mg Oral TID WC Christl Fessenden B Keshara Kiger, MD   300 mg at 06/16/15 1104  . LORazepam (ATIVAN) tablet 1 mg  1 mg Oral Q4H PRN Shari Prows, MD   1 mg at 06/16/15 1104  . magnesium hydroxide (MILK OF MAGNESIA) suspension 30 mL  30 mL Oral Daily PRN Jimmy Footman, MD      . nicotine (NICODERM CQ - dosed in mg/24 hours) patch 21 mg  21 mg Transdermal Daily PRN Jimmy Footman, MD      . OLANZapine (ZYPREXA) tablet 20 mg  20 mg Oral QHS Shari Prows, MD   20 mg at 06/15/15 2212  . topiramate (TOPAMAX) tablet 200 mg  200 mg Oral QHS Aevah Stansbery B Sanel Stemmer, MD   200 mg at 06/15/15 2212  . traZODone (DESYREL) tablet 100 mg  100 mg Oral QHS Jimmy Footman, MD   100 mg at 06/15/15 2212    Lab Results: No results found for this or any previous visit (from the past 48 hour(s)).  Physical Findings: AIMS:  , ,  ,  ,    CIWA:    COWS:     Musculoskeletal: Strength & Muscle Tone: within normal limits Gait & Station: unsteady Patient leans: N/A  Psychiatric Specialty Exam: Review of Systems  Musculoskeletal: Positive for falls.  Psychiatric/Behavioral: The patient is nervous/anxious.   All other systems reviewed and are negative.   Blood pressure 95/62, pulse 86, temperature 98.6 F (37 C), temperature source Oral, resp. rate 20, height  (1.803 m), weight 94.348 kg (208 lb), SpO2 95 %.Body mass index is 29.02 kg/(m^2).  General Appearance: Casual  Eye Contact::  Good  Speech:  Slurred  Volume:  Normal  Mood:  Euthymic  Affect:  Appropriate  Thought Process:  Goal Directed  Orientation:  Full (Time,  Place, and Person)  Thought Content:  Hallucinations: Auditory  Suicidal Thoughts:  No  Homicidal Thoughts:  No  Memory:  Immediate;   Fair Recent;   Fair Remote;   Fair  Judgement:  Fair  Insight:  Fair  Psychomotor Activity:  Normal  Concentration:  Fair  Recall:  [employer] of Knowledge:Fair  Language: Fair  Akathisia:  No  Handed:  Right  AIMS (if indicated):     Assets:  Communication Skills Desire for Improvement Financial Resources/Insurance Housing Intimacy Resilience Social Support  ADL's:  Intact  Cognition: WNL  Sleep:  Number of Hours: 6.75   Treatment Plan Summary: Daily contact with patient to  assess and evaluate symptoms and progress in treatment and Medication management   Danielle [name] is a 45 year old female with a history of paranoid schizophrenia admitted for suicidal ideation and self-injurious behavior in the context of physical illness.  1. Suicidal ideation. The patient assures me that she does not want to die, yet she is so frustrated with frequent falls and inability to walk that she is considering hurting herself. She's been repeatedly hitting her legs.   2. Psychosis. The patient had been maintained on Haldol for several years. This was recently switched to Abilify. We continued Abilify but added Zyprexa. I will increase Zyprexa to 20 mg at bedtime. The patient insists on keeping Abilify.   3. Mood. The patient has also been on Depakote. VPA level was 62, ammonia 69. Depakote was discontinued and Lithium started for mood stabilization. Lithium level 0.7, BUN/creatinine are normal.  4. Insomnia. We continued trazodone.  5. Hypothyroidism. We continued Synthroid po daily  6. Falls. The patient has a history of falls leading to multiple injuries. She fell in the hospital several times. She fell over the weekend and this morning again. This time with incontinence. She has one-to-one Comptroller. CT of the head did show frontal lobe atrophy advanced for  her age.Both cardiology work up here and neurology work up in the community were negative. She awaits 30-day Holter monitoring after discharge.   7. R/A arrhythmia. Cardiology consult is greatly appreciated. Cardiac Echo is unremarkable.   8. Smoking. She is on nicotine patch.  9. Metabolic syndrome. Lipid panel is elevated. Hemoglobin A1c TSH are normal. Prolactin 39.1 in a patient with a history of recent Haldol treatment.   10. Low vitamin B12 and folate. We started both.   11. Headaches. She is on Topamax. We increased it to 200 mg.   12. PT. the patient is deconditioned. They recommended home PT and a rolling walker.   13. R/O nacolepsy. Spoke with Dr. Thad Ranger. She did not suggest any studies as the patient did have EEG video monitoring and sleep study done already.  14. Anxiety. She has been getting Ativan 1 mg as needed on average twice daily at noon and at night. We will switch to Vistaril as the patient is high fall risk.   15. Disposition. She will be discharged to home with her family. She will follow up with Dr. Lolly Mustache. Anticipated discharge on Monday.     Kristine Linea, MD 06/16/2015, 11:15 AM

## 2015-06-16 NOTE — Plan of Care (Signed)
Problem: Alteration in mood Goal: LTG-Patient reports reduction in suicidal thoughts (Patient reports reduction in suicidal thoughts and is able to verbalize a safety plan for whenever patient is feeling suicidal)  Outcome: Progressing Patient denies SI.      

## 2015-06-16 NOTE — [facility] Group Notes (Signed)
[facility] LCSW Group Therapy  06/16/2015 4:35 PM  Type of Therapy:  Group Therapy  Participation Level:  Active  Participation Quality:  Appropriate and Attentive  Affect:  Appropriate  Cognitive:  Alert, Appropriate and Oriented  Insight:  Engaged  Engagement in Therapy:  Engaged  Modes of Intervention:  Discussion, Socialization and Support  Summary of Progress/Problems: Patient attended and participated appropriately in group discussion introducing herself and participating in an introductory exercise sharing 3 things she would take on a deserted [place]. Patient shared that she has struggled with changes when her father passed away and having to change roles and to manage daily activities that he once did. Patient was able to offer support to other group members who had similar experiences.    Lulu Riding, MSW, LCSWA 06/16/2015, 4:35 PM

## 2015-06-16 NOTE — Progress Notes (Signed)
D: Pt denies SI/HI/AVH. Pt is pleasant and cooperative with care, he appears less anxious and he is interacting with peers and staff appropriately.  A: Pt was offered support and encouragement. Pt was given scheduled medications. Pt was encouraged to attend groups. Q 15 minute checks were done for safety.  R:Pt attends groups and interacts well with peers and staff. Pt is taking medication. Pt receptive to treatment and safety maintained on unit.

## 2015-06-16 NOTE — [facility] Group Notes (Signed)
[facility] LCSW Aftercare Discharge Planning Group Note   06/16/2015 4:15 PM  Participation Quality:  Patient was called to group but did not attend    Lulu Riding, MSW, LCSWA

## 2015-06-16 NOTE — Progress Notes (Signed)
Patient ID: Danielle [name], female   DOB: 1970-05-24, 45 y.o.   MRN: [id number] CSW spoke with the pt's husband and informed him that the pt will be discharging Monday February 13th.  Pt's husband requested that she be discharged at 5:30pm when the pt can arrive after work.  CSW confirmed that this would be possible and that the CSW would update the pt's husband Monday morning.

## 2015-06-16 NOTE — Progress Notes (Signed)
Physical Therapy Treatment Patient Details Name: Danielle [name] MRN: [id number] DOB: 01/17/1971 Today's Date: 06/16/2015    History of Present Illness [name] is a 45 year old female with a history of paranoid schizophrenia admitted for suicidal ideation and self-injurious behavior in the context of physical illness.    PT Comments    Pt demonstrates ability to increase ambulation distance on this date. Attempted short distance ambulation however is too unsteady and returned to rolling walker. Pt does present with low resting BP at 94/41. Drops to 87/56 with standing. Pt has history of syncope of unknown etiology. Pt will benefit from skilled PT services to address deficits in strength, balance, and mobility in order to return to full function at home.    Follow Up Recommendations  Home health PT     Equipment Recommendations  Rolling walker with 5" wheels    Recommendations for Other Services       Precautions / Restrictions Precautions Precautions: Fall Restrictions Weight Bearing Restrictions: No    Mobility  Bed Mobility               General bed mobility comments: Received upright in wheelchair from group therapy. Bed mobility not observed  Transfers Overall transfer level: Modified independent Equipment used: Rolling walker (2 wheeled)             General transfer comment: Pt demonstrates good sequencing with sit to stand. No evidence for instability  Ambulation/Gait Ambulation/Gait assistance: Min guard Ambulation Distance (Feet): 240 Feet [phone]) Assistive device: Rolling walker (2 wheeled) Gait Pattern/deviations: Step-through pattern;Antalgic Gait velocity: Decreased but functional Gait velocity interpretation: <1.8 ft/sec, indicative of risk for recurrent falls General Gait Details: Pt again demonstrates heavy UE reliance on rolling walker but able to correct with cues. Pt reports one episode of dizziness and provided seated rest break. Chair  follow for entire distance due to history of syncope. Pt has one staring episode where she is slow to respond to questions however does nto close eyes or appear to be dizziness or with presyncopal symptoms. vitals monitroed intermittently and remain WNL. Attempted short distance ambulation without assistive device however pt leans heavily to the R and intermittently reaches for the wall due to unsteadiness. Pt reports chronic RLE pain from injury   Stairs            Wheelchair Mobility    Modified Rankin (Stroke Patients Only)       Balance Overall balance assessment: Needs assistance Sitting-balance support: Feet supported;No upper extremity supported Sitting balance-Leahy Scale: Good     Standing balance support: Bilateral upper extremity supported Standing balance-Leahy Scale: Fair                      Cognition Arousal/Alertness: Awake/alert Behavior During Therapy: Flat affect Overall Cognitive Status: History of cognitive impairments - at baseline                      Exercises General Exercises - Lower Extremity Long Arc Quad: Strengthening;Both;Seated;15 reps Heel Slides: Strengthening;Both;Seated;15 reps Hip ABduction/ADduction: Strengthening;Both;Seated;15 reps Hip Flexion/Marching: Strengthening;Both;Seated;15 reps Heel Raises: Strengthening;Both;Seated;15 reps    General Comments        Pertinent Vitals/Pain Pain Assessment: No/denies pain    Home Living                      Prior Function            PT Goals (current goals can now  be found in the care plan section) Acute Rehab PT Goals Patient Stated Goal: None stated. PT Goal Formulation: With patient Time For Goal Achievement: 06/23/15 Potential to Achieve Goals: Fair Progress towards PT goals: Progressing toward goals    Frequency  Min 2X/week    PT Plan Current plan remains appropriate    Co-evaluation             End of Session Equipment Utilized  During Treatment: Gait belt Activity Tolerance: Patient tolerated treatment well Patient left: with nursing/sitter in room (In wheelchair, RN assisting to bathroom)     Time: 6962-9528 PT Time Calculation (min) (ACUTE ONLY): 23 min  Charges:  $Gait Training: 8-22 mins $Therapeutic Exercise: 8-22 mins                    G Codes:      Sharalyn Ink Shenae Bonanno PT, DPT   Ketan Renz 06/16/2015, 12:22 PM

## 2015-06-16 NOTE — Progress Notes (Signed)
D:  Per pt self inventory pt reports sleeping good, appetite good, energy level normal, ability to pay attention good, rates depression at a 0 out of 10, hopelessness at a 3 out of 10, anxiety at a 5 out of 10, denies SI/HI/VH, endorses AH, contracts for safety, goal today: "to see the doctor", pt still on 1:1 sitter for safety, flat/depressed during interaction.     A:  Emotional support provided, Encouraged pt to continue with treatment plan and attend all group activities, q15 min checks maintained for safety.  R:  Pt is receptive, going to groups, pleasant and cooperative with staff and other patients on the unit.

## 2015-06-17 NOTE — [facility] Group Notes (Signed)
[facility] Group Notes:  (Nursing/MHT/Case Management/Adjunct)  Date:  06/17/2015  Time:  10:11 AM  Type of Therapy:  Psychoeducational Skills  Participation Level:  Active  Participation Quality:  Appropriate  Affect:  Appropriate  Cognitive:  Alert  Insight:  Appropriate  Engagement in Group:  Engaged  Modes of Intervention:  Education  Summary of Progress/Problems:  Lorre Munroe 06/17/2015, 10:11 AM

## 2015-06-17 NOTE — [facility] Group Notes (Signed)
[facility] LCSW Group Therapy  06/17/2015 12:15 PM  Type of Therapy:  Group Therapy  Participation Level:  Active  Participation Quality:  Attentive  Affect:  Appropriate  Cognitive:  Alert  Insight:  Improving  Engagement in Therapy:  Improving  Modes of Intervention:  Discussion, Education, Socialization and Support  Summary of Progress/Problems: Balance in life: Patients will discuss the concept of balance and how it looks and feels to be unbalanced. Pt will identify areas in their life that is unbalanced and ways to become more balanced.  Blyss attended group and stayed the entire time. She reports feeling less dizzy and less depressed. She discussed her support system and the difficulties she has experienced with her medication.   [employer] MSW, LCSWA  06/17/2015, 12:15 PM

## 2015-06-17 NOTE — Progress Notes (Signed)
Pt is 1:1 for safety. House supervisor, Water quality scientist and MD made aware that there is no staff on the unit available to be able to sit with Pt. Exclusively.

## 2015-06-17 NOTE — Progress Notes (Signed)
D: Pt denies SI/HI/AVH. Pt is pleasant and cooperative, affect bright and appropriate.  Pt stated earlier she had a headache, and was medicated with ibuprofen per standing order. Patient appears less anxious, has 1:1 sitter at bedside for safety; patient is interacting with peers and staff appropriately.  A: Pt was offered support and encouragement. Pt was given scheduled medications. Pt was encouraged to attend groups. Q 15 minute checks were done for safety.  R:Pt attends evening groups and interacts well with peers and staff. Pt is complaint with medication. Pt receptive to treatment and safety maintained on unit.

## 2015-06-17 NOTE — Progress Notes (Signed)
[facility] MD Progress Note  06/17/2015 4:29 PM Danielle [name]  MRN:  [id number]  Subjective:  Danielle [name] is a 45 year old female with a history of schizophrenia admitted for worsening of psychosis in the context of recent medication changes by her primary psychiatrist. Patient was seen for follow-up. She continues to have auditory hallucinations but the voices are improving. She has fainting episodes when her husband was around and also when she got upset. She has started improving on her medications. She continues to have a Recruitment consultant and uses a wheelchair. She reported that she is tolerating the medications well. She is also attending the groups on a regular basis. She slept well last night. She currently denied having any suicidal homicidal ideations or plans.    Principal Problem: Paranoid schizophrenia (HCC) Diagnosis:   Patient Active Problem List   Diagnosis Date Noted  . Tobacco use disorder [F17.200] 06/08/2015  . Hyperammonemia (HCC) [E72.20] 06/08/2015  . Paranoid schizophrenia (HCC) [F20.0]   . Migraine headache [G43.909] 06/04/2012  . Seizure (HCC) [R56.9] 06/04/2012  . Hypothyroidism [E03.9] 06/04/2012  . OSA (obstructive sleep apnea) [G47.33] 08/29/2011  . Post traumatic stress disorder (PTSD) [F43.10] 07/31/2011   Total Time spent with patient: 20 minutes  Past Psychiatric History: Schizophrenia.  Past Medical History:  Past Medical History  Diagnosis Date  . Depressed   . Seizures (HCC)   . Heart murmur   . Hypothyroidism   . Anemia   . Headache(784.0)   . Anxiety   . PTSD (post-traumatic stress disorder)   . Shortness of breath   . Schizophrenia (HCC)   . Bipolar 1 disorder [facility])     Past Surgical History  Procedure Laterality Date  . Tubal ligation  1996  . Foot surgery     Family History:  Family History  Problem Relation Age of Onset  . Adopted: Yes   Family Psychiatric  History: None reported. Social History:  History  Alcohol Use No      History  Drug Use No    Social History   Social History  . Marital Status: Married    Spouse Name: N/A  . Number of Children: 2  . Years of Education: N/A   Occupational History  . unemployed    Social History Main Topics  . Smoking status: Former Smoker -- 1.50 packs/day for 30 years    Types: Cigarettes    Quit date: 09/20/2011  . Smokeless tobacco: Never Used  . Alcohol Use: No  . Drug Use: No  . Sexual Activity: Yes    Birth Control/ Protection: Surgical   Other Topics Concern  . None   Social History Narrative   Pt is adopted. Unsure of family history.   Additional Social History:                         Sleep: Fair  Appetite:  Fair  Current Medications: Current Facility-Administered Medications  Medication Dose Route Frequency Provider Last Rate Last Dose  . ARIPiprazole (ABILIFY) tablet 20 mg  20 mg Oral Daily Shari Prows, MD   20 mg at 06/17/15 0826  . aspirin EC tablet 81 mg  81 mg Oral Q breakfast Jimmy Footman, MD   81 mg at 06/17/15 0826  . cyanocobalamin tablet 1,000 mcg  1,000 mcg Oral Daily Shari Prows, MD   1,000 mcg at 06/17/15 0826  . folic acid (FOLVITE) tablet 1 mg  1 mg Oral Daily Jolanta B  Pucilowska, MD   1 mg at 06/17/15 0826  . hydrOXYzine (ATARAX/VISTARIL) tablet 25 mg  25 mg Oral TID PRN Shari Prows, MD   25 mg at 06/17/15 0826  . ibuprofen (ADVIL,MOTRIN) tablet 600 mg  600 mg Oral Q8H PRN Jimmy Footman, MD   600 mg at 06/16/15 2010  . levothyroxine (SYNTHROID, LEVOTHROID) tablet 75 mcg  75 mcg Oral QAC breakfast Jimmy Footman, MD   75 mcg at 06/17/15 0700  . lithium carbonate capsule 300 mg  300 mg Oral TID WC Jolanta B Pucilowska, MD   300 mg at 06/17/15 1144  . magnesium hydroxide (MILK OF MAGNESIA) suspension 30 mL  30 mL Oral Daily PRN Jimmy Footman, MD      . nicotine (NICODERM CQ - dosed in mg/24 hours) patch 21 mg  21 mg Transdermal Daily PRN  Jimmy Footman, MD      . OLANZapine (ZYPREXA) tablet 20 mg  20 mg Oral QHS Shari Prows, MD   20 mg at 06/16/15 2208  . topiramate (TOPAMAX) tablet 200 mg  200 mg Oral QHS Jolanta B Pucilowska, MD   200 mg at 06/16/15 2242  . traZODone (DESYREL) tablet 100 mg  100 mg Oral QHS Jimmy Footman, MD   100 mg at 06/16/15 2208    Lab Results: No results found for this or any previous visit (from the past 48 hour(s)).  Physical Findings: AIMS:  , ,  ,  ,    CIWA:    COWS:     Musculoskeletal: Strength & Muscle Tone: within normal limits Gait & Station: unsteady Patient leans: N/A  Psychiatric Specialty Exam: Review of Systems  Musculoskeletal: Positive for falls.  Psychiatric/Behavioral: The patient is nervous/anxious.   All other systems reviewed and are negative.   Blood pressure 90/60, pulse 62, temperature 98.1 F (36.7 C), temperature source Oral, resp. rate 18, height 5\' 11"  (1.803 m), weight 208 lb (94.348 kg), SpO2 95 %.Body mass index is 29.02 kg/(m^2).  General Appearance: Casual  Eye Contact::  Good  Speech:  Slurred  Volume:  Normal  Mood:  Euthymic  Affect:  Appropriate  Thought Process:  Goal Directed  Orientation:  Full (Time, Place, and Person)  Thought Content:  Hallucinations: Auditory  Suicidal Thoughts:  No  Homicidal Thoughts:  No  Memory:  Immediate;   Fair Recent;   Fair Remote;   Fair  Judgement:  Fair  Insight:  Fair  Psychomotor Activity:  Normal  Concentration:  Fair  Recall:  [employer] of Knowledge:Fair  Language: Fair  Akathisia:  No  Handed:  Right  AIMS (if indicated):     Assets:  Communication Skills Desire for Improvement Financial Resources/Insurance Housing Intimacy Resilience Social Support  ADL's:  Intact  Cognition: WNL  Sleep:  Number of Hours: 7.75   Treatment Plan Summary: Daily contact with patient to assess and evaluate symptoms and progress in treatment and Medication management   Ms.  [name] is a 45 year old female with a history of paranoid schizophrenia admitted for suicidal ideation and self-injurious behavior in the context of physical illness.  1. Suicidal ideation. Improving  2. Psychosis. The patient had been maintained on Haldol for several years. This was recently switched to Abilify. We continued Abilify but added Zyprexa. I will increase Zyprexa to 20 mg at bedtime. The patient insists on keeping Abilify.   3. Mood. The patient has also been on Depakote. VPA level was 62, ammonia 69. Depakote was discontinued and Lithium  started for mood stabilization. Lithium level 0.7, BUN/creatinine are normal.  4. Insomnia. We continued trazodone.  5. Hypothyroidism. We continued Synthroid po daily  6. Falls. The patient has a history of falls leading to multiple injuries. She fell in the hospital several times. She fell over the weekend and this morning again. This time with incontinence. She has one-to-one Comptroller. 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 She awaits 30-day Holter monitoring after discharge.   7. R/A arrhythmia. Cardiology consult is greatly appreciated. Cardiac Echo is unremarkable.   8. Smoking. She is on nicotine patch.  9. Metabolic syndrome. Lipid panel is elevated. Hemoglobin A1c TSH are normal. Prolactin 39.1 in a patient with a history of recent Haldol treatment.   10. Low vitamin B12 and folate. We started both.   11. Headaches. She is on Topamax. We increased it to 200 mg.   12. PT. the patient is deconditioned. They recommended home PT and a rolling walker.   13. R/O nacolepsy. Spoke with Dr. Thad Ranger. She did not suggest any studies as the patient did have EEG video monitoring and sleep study done already.  14. Anxiety. She has been getting Ativan 1 mg as needed on average twice daily at noon and at night. We will switch to  Vistaril as the patient is high fall risk.   15. Disposition. She will be discharged to home with her family. She will follow up with Dr. Lolly Mustache. Anticipated discharge on Monday.     Brandy Hale, MD 06/17/2015, 4:29 PM

## 2015-06-17 NOTE — Progress Notes (Signed)
Sitter placed with Pt @ 1130 pm

## 2015-06-17 NOTE — Progress Notes (Addendum)
D:  Per pt self inventory pt reports sleeping good, appetite good, energy level normal, ability to pay attention good, rates depression at a 0 out of 10, hopelessness at a 0 out of 10, anxiety at a 5 out of 10, denies SI/HI/AVH, goal today: "Speak with the doctor", anxious during interaction, pt still has order for sitter at bedside, pt uses walker to ambulate.     A:  Emotional support provided, Encouraged pt to continue with treatment plan and attend all group activities, q15 min checks maintained for safety.  R:  Pt is receptive, going to groups, pleasant and cooperative with staff and other patients on the unit.

## 2015-06-17 NOTE — Plan of Care (Signed)
Problem: Alteration in mood Goal: LTG-Patient reports reduction in suicidal thoughts (Patient reports reduction in suicidal thoughts and is able to verbalize a safety plan for whenever patient is feeling suicidal)  Outcome: Progressing Patient denies SI.      

## 2015-06-18 DIAGNOSIS — R296 Repeated falls: Secondary | ICD-10-CM

## 2015-06-18 MED ORDER — BUSPIRONE HCL 5 MG PO TABS: 5.0000 mg | ORAL_TABLET | Freq: Two times a day (BID) | ORAL | Status: DC

## 2015-06-18 MED ORDER — BUSPIRONE HCL 5 MG PO TABS
5.0000 mg | ORAL_TABLET | Freq: Two times a day (BID) | ORAL | Status: DC
  Administered 2015-06-18 – 2015-06-19 (×3): 5 mg via ORAL
  Filled 2015-06-18 (×4): qty 1

## 2015-06-18 NOTE — [facility] Suicide Risk Assessment (Addendum)
[facility] Discharge Suicide Risk Assessment   Principal Problem: Paranoid schizophrenia [facility]) Discharge Diagnoses:  Patient Active Problem List   Diagnosis Date Noted  . Frequent falls [R29.6] 06/18/2015  . Tobacco use disorder [F17.200] 06/08/2015  . Hyperammonemia (HCC) [E72.20] 06/08/2015  . Paranoid schizophrenia (HCC) [F20.0]   . Migraine headache [G43.909] 06/04/2012  . Seizure (HCC) [R56.9] 06/04/2012  . Hypothyroidism [E03.9] 06/04/2012  . OSA (obstructive sleep apnea) [G47.33] 08/29/2011  . Post traumatic stress disorder (PTSD) [F43.10] 07/31/2011    Total Time spent with patient: 30 minutes  Musculoskeletal: Strength & Muscle Tone: within normal limits Gait & Station: unsteady Patient leans: N/A  Psychiatric Specialty Exam: Review of Systems  Musculoskeletal: Positive for falls.  All other systems reviewed and are negative.   Blood pressure 92/66, pulse 69, temperature 97.9 F (36.6 C), temperature source Oral, resp. rate 18, height  (1.803 m), weight 94.348 kg (208 lb), SpO2 95 %.Body mass index is 29.02 kg/(m^2).  General Appearance: Casual  Eye Contact::  Good  Speech:  Clear and Coherent409  Volume:  Normal  Mood:  Euthymic  Affect:  Appropriate  Thought Process:  Goal Directed  Orientation:  Full (Time, Place, and Person)  Thought Content:  Hallucinations: Auditory  Suicidal Thoughts:  No  Homicidal Thoughts:  No  Memory:  Immediate;   Fair Recent;   Fair Remote;   Fair  Judgement:  Fair  Insight:  Fair  Psychomotor Activity:  Normal  Concentration:  Fair  Recall:  [employer] of Knowledge:Fair  Language: Fair  Akathisia:  No  Handed:  Right  AIMS (if indicated):     Assets:  Communication Skills Desire for Improvement Financial Resources/Insurance Housing Intimacy Resilience Social Support  Sleep:  Number of Hours: 5.15  Cognition: WNL  ADL's:  Intact   Mental Status Per Nursing Assessment::   On Admission:     Demographic Factors:   Caucasian  Loss Factors: Decline in physical health  Historical Factors: Prior suicide attempts and Family history of mental illness or substance abuse  Risk Reduction Factors:   Sense of responsibility to family, Living with another person, especially a relative, Positive social support and Positive therapeutic relationship  Continued Clinical Symptoms:  Schizophrenia:   Paranoid or undifferentiated type  Cognitive Features That Contribute To Risk:  None    Suicide Risk:  Minimal: No identifiable suicidal ideation.  Patients presenting with no risk factors but with morbid ruminations; may be classified as minimal risk based on the severity of the depressive symptoms  Follow-up Information    Call [facility] .   Why:  Please arrive on February 17th at 10am for your hospital follow up appointment for medication managment and therapy upon discharge.   Contact information:   [address] [name], [place] [address] Phone: [phone] Fax:[phone] Email:  Fax: [phone]      Follow up with [facility] .   Why:  Please arrive for your cardiology appointment with Dr. Lowell Guitar on March 7th at 1pm for a new patient consult with your medication list i.d. and insurance   Contact information:   [address] [place], [place] [address] [address] Phone: [phone] Fax: [phone]      Plan Of Care/Follow-up recommendations:  Activity:  as tolerated. Diet:  low sodium heart healthy. Other:  keep follow up appointments.  Kristine Linea, MD 06/18/2015, 6:23 PM

## 2015-06-18 NOTE — [facility] Group Notes (Signed)
[facility] LCSW Group Therapy  06/18/2015 3:41 PM  Type of Therapy:  Group Therapy  Participation Level:  Minimal  Participation Quality:  Attentive  Affect:  Appropriate  Cognitive:  Alert  Insight:  Improving  Engagement in Therapy:  Improving  Modes of Intervention:  Discussion, Education, Socialization and Support  Summary of Progress/Problems: Todays topic: Grudges  Patients will be encouraged to discuss their thoughts, feelings, and behaviors as to why one holds on to grudges and reasons why people have grudges. Patients will process the impact of grudges on their daily lives and identify thoughts and feelings related to holding grudges. Patients will identify feelings and thoughts related to what life would look like without grudges. Pt attended group and stayed the entire time. She sat quietly and listened to other group members.   Korah Hufstedler L Maurie Musco MSW, LCSWA  06/18/2015, 3:41 PM   

## 2015-06-18 NOTE — Progress Notes (Signed)
[facility] MD Progress Note  06/18/2015 6:49 PM Danielle [name]  MRN:  [id number]  Subjective:  Danielle [name] is a 45 year old female with a history of schizophrenia admitted for worsening of psychosis in the context of recent medication changes by her primary psychiatrist. Patient was seen for follow-up. She appeared much calmer and alert during this morning. She reported that she is not experiencing any auditory or visual hallucinations. She reported that she is able to walk by herself and is not using the walker. She was asking about the Vistaril and reported that the Vistaril is not prescribed at frequent intervals as she is experiencing some anxiety. She was also asking about the lorazepam prescription. She reported that she wants a medication for her anxiety as she feels anxious. She denied having any mood swings anger anxiety or paranoia at this time. She is looking forward to be discharged   Principal Problem: Paranoid schizophrenia (HCC) Diagnosis:   Patient Active Problem List   Diagnosis Date Noted  . Frequent falls [R29.6] 06/18/2015  . Tobacco use disorder [F17.200] 06/08/2015  . Hyperammonemia (HCC) [E72.20] 06/08/2015  . Paranoid schizophrenia (HCC) [F20.0]   . Migraine headache [G43.909] 06/04/2012  . Seizure (HCC) [R56.9] 06/04/2012  . Hypothyroidism [E03.9] 06/04/2012  . OSA (obstructive sleep apnea) [G47.33] 08/29/2011  . Post traumatic stress disorder (PTSD) [F43.10] 07/31/2011   Total Time spent with patient: 20 minutes  Past Psychiatric History: Schizophrenia.  Past Medical History:  Past Medical History  Diagnosis Date  . Depressed   . Seizures (HCC)   . Heart murmur   . Hypothyroidism   . Anemia   . Headache(784.0)   . Anxiety   . PTSD (post-traumatic stress disorder)   . Shortness of breath   . Schizophrenia (HCC)   . Bipolar 1 disorder [facility])     Past Surgical History  Procedure Laterality Date  . Tubal ligation  1996  . Foot surgery     Family History:   Family History  Problem Relation Age of Onset  . Adopted: Yes   Family Psychiatric  History: None reported. Social History:  History  Alcohol Use No     History  Drug Use No    Social History   Social History  . Marital Status: Married    Spouse Name: N/A  . Number of Children: 2  . Years of Education: N/A   Occupational History  . unemployed    Social History Main Topics  . Smoking status: Former Smoker -- 1.50 packs/day for 30 years    Types: Cigarettes    Quit date: 09/20/2011  . Smokeless tobacco: Never Used  . Alcohol Use: No  . Drug Use: No  . Sexual Activity: Yes    Birth Control/ Protection: Surgical   Other Topics Concern  . None   Social History Narrative   Pt is adopted. Unsure of family history.   Additional Social History:                         Sleep: Fair  Appetite:  Fair  Current Medications: Current Facility-Administered Medications  Medication Dose Route Frequency Provider Last Rate Last Dose  . ARIPiprazole (ABILIFY) tablet 20 mg  20 mg Oral Daily Jolanta B Pucilowska, MD   20 mg at 06/18/15 0901  . aspirin EC tablet 81 mg  81 mg Oral Q breakfast Jimmy Footman, MD   81 mg at 06/18/15 0900  . busPIRone (BUSPAR) tablet 5 mg  5 mg Oral BID Brandy Hale, MD   5 mg at 06/18/15 1159  . cyanocobalamin tablet 1,000 mcg  1,000 mcg Oral Daily Jolanta B Pucilowska, MD   1,000 mcg at 06/18/15 0900  . folic acid (FOLVITE) tablet 1 mg  1 mg Oral Daily Jolanta B Pucilowska, MD   1 mg at 06/18/15 0901  . hydrOXYzine (ATARAX/VISTARIL) tablet 25 mg  25 mg Oral TID PRN Shari Prows, MD   25 mg at 06/18/15 0007  . ibuprofen (ADVIL,MOTRIN) tablet 600 mg  600 mg Oral Q8H PRN Jimmy Footman, MD   600 mg at 06/18/15 0903  . levothyroxine (SYNTHROID, LEVOTHROID) tablet 75 mcg  75 mcg Oral QAC breakfast Jimmy Footman, MD   75 mcg at 06/18/15 [phone]  . lithium carbonate capsule 300 mg  300 mg Oral TID WC Jolanta B  Pucilowska, MD   300 mg at 06/18/15 1620  . magnesium hydroxide (MILK OF MAGNESIA) suspension 30 mL  30 mL Oral Daily PRN Jimmy Footman, MD      . nicotine (NICODERM CQ - dosed in mg/24 hours) patch 21 mg  21 mg Transdermal Daily PRN Jimmy Footman, MD      . OLANZapine (ZYPREXA) tablet 20 mg  20 mg Oral QHS Shari Prows, MD   20 mg at 06/17/15 2156  . topiramate (TOPAMAX) tablet 200 mg  200 mg Oral QHS Shari Prows, MD   200 mg at 06/17/15 2156  . traZODone (DESYREL) tablet 100 mg  100 mg Oral QHS Jimmy Footman, MD   100 mg at 06/17/15 2156    Lab Results: No results found for this or any previous visit (from the past 48 hour(s)).  Physical Findings: AIMS:  , ,  ,  ,    CIWA:    COWS:     Musculoskeletal: Strength & Muscle Tone: within normal limits Gait & Station: unsteady Patient leans: N/A  Psychiatric Specialty Exam: Review of Systems  Musculoskeletal: Positive for falls.  Psychiatric/Behavioral: The patient is nervous/anxious.   All other systems reviewed and are negative.   Blood pressure 92/66, pulse 69, temperature 97.9 F (36.6 C), temperature source Oral, resp. rate 18, height  (1.803 m), weight 208 lb (94.348 kg), SpO2 95 %.Body mass index is 29.02 kg/(m^2).  General Appearance: Casual  Eye Contact::  Good  Speech:  Clear and Coherent  Volume:  Normal  Mood:  Euthymic  Affect:  Appropriate  Thought Process:  Goal Directed  Orientation:  Full (Time, Place, and Person)  Thought Content:  Hallucinations: Auditory  Suicidal Thoughts:  No  Homicidal Thoughts:  No  Memory:  Immediate;   Fair Recent;   Fair Remote;   Fair  Judgement:  Fair  Insight:  Fair  Psychomotor Activity:  Normal  Concentration:  Fair  Recall:  [employer] of Knowledge:Fair  Language: Fair  Akathisia:  No  Handed:  Right  AIMS (if indicated):     Assets:  Communication Skills Desire for Improvement Financial  Resources/Insurance Housing Intimacy Resilience Social Support  ADL's:  Intact  Cognition: WNL  Sleep:  Number of Hours: 5.15   Treatment Plan Summary: Daily contact with patient to assess and evaluate symptoms and progress in treatment and Medication management   Danielle [name] is a 45 year old female with a history of paranoid schizophrenia admitted for suicidal ideation and self-injurious behavior in the context of physical illness.  1. Suicidal ideation. Denied   2. Psychosis. The patient had been maintained  on Haldol for several years. This was recently switched to Abilify. We continued Abilify but added Zyprexa. I will increase Zyprexa to 20 mg at bedtime. The patient insists on keeping Abilify.   3. Mood. The patient has also been on Depakote. VPA level was 62, ammonia 69. Depakote was discontinued and Lithium started for mood stabilization. Lithium level 0.7, BUN/creatinine are normal.  4. Insomnia. We continued trazodone.  5. Hypothyroidism. We continued Synthroid po daily  6. Falls. None reported   7. R/A arrhythmia. Cardiology consult is greatly appreciated. Cardiac Echo is unremarkable.   8. Smoking. She is on nicotine patch.  9. Metabolic syndrome. Lipid panel is elevated. Hemoglobin A1c TSH are normal. Prolactin 39.1 in a patient with a history of recent Haldol treatment.   10. Low vitamin B12 and folate. We started both.   11. Headaches. She is on Topamax. We increased it to 200 mg.   12. PT. Normal walking.   13. R/O nacolepsy. Spoke with Dr. Thad Ranger. She did not suggest any studies as the patient did have EEG video monitoring and sleep study done already.  14. Anxiety. Continue vistaril prn.   15. Disposition. She will be discharged to home with her family. She will follow up with Dr. Lolly Mustache. Anticipated discharge on Monday.     Brandy Hale, MD 06/18/2015, 6:49 PM

## 2015-06-18 NOTE — Discharge Summary (Signed)
Physician Discharge Summary Note  Patient:  Danielle [name] is an 45 y.o., female MRN:  [id number] DOB:  1971-03-18 Patient phone:  [phone] (home)  Patient address:   [address] [place] [address],  Total Time spent with patient: 30 minutes  Date of Admission:  06/07/2015 Date of Discharge: 06/19/2015  Reason for Admission:  Psychosis.  Identifying data. Danielle [name] is a 45 year old female with a history of paranoid schizophrenia.  Chief complaint. "I will fall in the most peculiar circumstances. It is embarrassing."  History of present illness. Information was obtained from the patient and the chart. The patient has a long history of schizophrenia. She has been in the care of Dr. Lolly Mustache for several years now and relatively stable on a combination of Haldol, Depakote, and trazodone. Recently the patient started complaining of increased paranoia venting her from leaving the house and auditory hallucinations. Dr. Lolly Mustache plan to taper the Haldol of and switch the patient to Abilify however the patient could not get her Abilify as of yet due to prior approval process. She became increasingly paranoid, depressed, hallucinating, and thinking of hurting herself. She admits that she does not want to die but has been very frustrated with no access to necessary medications and deterioration of her general health. For extended periods of time she has been unable to walk and has been falling frequently. She describes it as episodes of dizziness. She has been seen by neurologist rule out seizures. She is now under investigation children by cardiologist with a plan to do Holter monitoring next week. The patient cannot function very well at home. Apparently she does not use a walker but walks along the walls. She's been falling frequently with head bumps and cuts. She is very frustrated because falls happen randomly in most embarrassing circumstances. She has been trying to hurt herself by hitting  herself. As she is sitting in the wheelchair she started hitting her legs. She also experiences periods of confusion. She noticed difficulties with her speech. Indeed her speech is slurred. She reports many symptoms of depression with poor sleep, decreased appetite, anhedonia, feeling of guilt and hopelessness worthlessness, poor energy and concentration, social isolation, crying spells and passive suicidal ideation. She reports increased anxiety symptoms of PTSD type. She experiences more hallucinations and paranoia since Haldol dose was lowered. She denies any alcohol or illicit substance use.  Past psychiatric history. She was diagnosed with schizophrenia many years ago. She was in the hospital 3 or 4 times before always after suicide attempt. She had attempted suicide by overdose and drowning. She has been tried on multiple medications including Risperdal, Vega, Haldol, Zoloft, Wellbutrin, amitriptyline, trazodone. She is not historian and did not recognize the name of Risperdal thinking that her son was treated with Risperdal. In review of her records it is clear that she has been on Risperdal for extensive periods of time.   Family psychiatric history. She was adopted. She has a son with developmental disability.  Social history. She has a high school diploma. [place] The family relocated to [place] after her father passed away and they no longer afford to live in [place]. She lives with her husband and her son. Apparently she does not have disability. She has Media planner. She does not qualify for Medicaid.  Principal Problem: Paranoid schizophrenia [facility]) Discharge Diagnoses: Patient Active Problem List   Diagnosis Date Noted  . Frequent falls [R29.6] 06/18/2015  .  Tobacco use disorder [F17.200] 06/08/2015  . Hyperammonemia (HCC) [E72.20] 06/08/2015  . Paranoid schizophrenia (HCC) [F20.0]   . Migraine headache [G43.909]  06/04/2012  . Seizure (HCC) [R56.9] 06/04/2012  . Hypothyroidism [E03.9] 06/04/2012  . OSA (obstructive sleep apnea) [G47.33] 08/29/2011  . Post traumatic stress disorder (PTSD) [F43.10] 07/31/2011    Past Psychiatric History: schizophrenia.  Past Medical History:  Past Medical History  Diagnosis Date  . Depressed   . Seizures (HCC)   . Heart murmur   . Hypothyroidism   . Anemia   . Headache(784.0)   . Anxiety   . PTSD (post-traumatic stress disorder)   . Shortness of breath   . Schizophrenia (HCC)   . Bipolar 1 disorder [facility])     Past Surgical History  Procedure Laterality Date  . Tubal ligation  1996  . Foot surgery     Family History:  Family History  Problem Relation Age of Onset  . Adopted: Yes   Family Psychiatric  History: son with autism. Social History:  History  Alcohol Use No     History  Drug Use No    Social History   Social History  . Marital Status: Married    Spouse Name: N/A  . Number of Children: 2  . Years of Education: N/A   Occupational History  . unemployed    Social History Main Topics  . Smoking status: Former Smoker -- 1.50 packs/day for 30 years    Types: Cigarettes    Quit date: 09/20/2011  . Smokeless tobacco: Never Used  . Alcohol Use: No  . Drug Use: No  . Sexual Activity: Yes    Birth Control/ Protection: Surgical   Other Topics Concern  . None   Social History Narrative   Pt is adopted. Unsure of family history.    Hospital Course:    Danielle [name] is a 45 year old female with a history of paranoid schizophrenia admitted for suicidal ideation and self-injurious behavior in the context of physical illness.  1. Suicidal ideation. This has resolved. The patient is able to contract for safety.  2. Psychosis. The patient had been maintained on Haldol for several years. This was recently switched to Abilify. We continued Abilify but added Zyprexa for persisting auditory hallucinations. The patient insistd on keeping  Abilify.   3. Mood. The patient has also been on Depakote. VPA level was 62, ammonia 69. Depakote was discontinued and Lithium started for mood stabilization. Lithium level 0.7, BUN/creatinine are normal.  4. Insomnia. We continued trazodone.  5. Hypothyroidism. We continued Synthroid po daily.  6. Falls. The patient has a history of falls leading to multiple injuries. She fell in the hospital several times. She has one-to-one Comptroller. 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   7. R/A arrhythmia. Cardiology consult is greatly appreciated. Cardiac Echo is unremarkable.She awaits 30-day Holter monitoring in the community after discharge.   8. Smoking. She is on nicotine patch.  9. Metabolic syndrome. Lipid panel is elevated. Hemoglobin A1c TSH are normal. Prolactin 39.1 in a patient with a history of recent Haldol treatment.   10. Low vitamin B12 and folate. We started both.   11. Headaches. She is on Topamax. We increased it to 200 mg.   12. PT. The patient is deconditioned. They recommended home PT and a rolling walker.   13. R/O nacolepsy. Spoke with Dr. Thad Ranger. She did not suggest any studies as  the patient did have EEG video monitoring and sleep study done already.  14. Anxiety. She has been getting Ativan 1 mg as needed on average twice daily at noon and at night. We switched to Vistaril and BuSpar as the patient is high fall risk.   15. Disposition. She was discharged to home with her family. She will follow up with Dr. Lolly Mustache.   Physical Findings: AIMS:  , ,  ,  ,    CIWA:    COWS:     Musculoskeletal: Strength & Muscle Tone: within normal limits Gait & Station: unsteady Patient leans: N/A  Psychiatric Specialty Exam: Review of Systems  Musculoskeletal: Positive for falls.  All other systems reviewed and are negative.   Blood pressure 92/66, pulse 69, temperature 97.9 F (36.6 C),  temperature source Oral, resp. rate 18, height 5\' 11"  (1.803 m), weight 94.348 kg (208 lb), SpO2 95 %.Body mass index is 29.02 kg/(m^2).  See SRA.                                                  Sleep:  Number of Hours: 5.15   Have you used any form of tobacco in the last 30 days? (Cigarettes, Smokeless Tobacco, Cigars, and/or Pipes): No  Has this patient used any form of tobacco in the last 30 days? (Cigarettes, Smokeless Tobacco, Cigars, and/or Pipes) Yes, Yes, A prescription for an FDA-approved tobacco cessation medication was offered at discharge and the patient refused  Metabolic Disorder Labs:  Lab Results  Component Value Date   HGBA1C 5.5 06/08/2015   MPG 123* 02/05/2012   MPG 114 09/22/2011   Lab Results  Component Value Date   PROLACTIN 39.1* 06/08/2015   Lab Results  Component Value Date   CHOL 218* 06/08/2015   TRIG 215* 06/08/2015   HDL 36* 06/08/2015   CHOLHDL 6.1 06/08/2015   VLDL 43* 06/08/2015   LDLCALC 139* 06/08/2015   LDLCALC UNABLE TO CALCULATE IF TRIGLYCERIDE OVER 400 mg/dL 16/02/9603    See Psychiatric Specialty Exam and Suicide Risk Assessment completed by Attending Physician prior to discharge.  Discharge destination:  Home  Is patient on multiple antipsychotic therapies at discharge:  Yes,   Do you recommend tapering to monotherapy for antipsychotics?  Yes   Has Patient had three or more failed trials of antipsychotic monotherapy by history:  No  Recommended Plan for Multiple Antipsychotic Therapies: Taper to monotherapy as described:  discontinue Abilify and continue Zyprexa.  Discharge Instructions    Diet - low sodium heart healthy    Complete by:  As directed      Increase activity slowly    Complete by:  As directed             Medication List    STOP taking these medications        aripiprazole 10 MG disintegrating tablet  Commonly known as:  ABILIFY  Replaced by:  ARIPiprazole 20 MG tablet      benztropine 0.5 MG tablet  Commonly known as:  COGENTIN     divalproex 500 MG 24 hr tablet  Commonly known as:  DEPAKOTE ER     haloperidol 5 MG tablet  Commonly known as:  HALDOL      TAKE these medications      Indication   ARIPiprazole 20 MG tablet  Commonly known  as:  ABILIFY  Take 1 tablet (20 mg total) by mouth daily.   Indication:  Schizophrenia     aspirin EC 81 MG tablet  Take 1 tablet (81 mg total) by mouth daily with breakfast.   Indication:  Heart Attack affecting Area Damaged by Previous Attack     busPIRone 5 MG tablet  Commonly known as:  BUSPAR  Take 1 tablet (5 mg total) by mouth 2 (two) times daily.   Indication:  Anxiety Disorder     cyanocobalamin 1000 MCG tablet  Take 1 tablet (1,000 mcg total) by mouth daily.   Indication:  Inadequate Vitamin B12     folic acid 1 MG tablet  Commonly known as:  FOLVITE  Take 1 tablet (1 mg total) by mouth daily.   Indication:  Deficiency of Folic Acid in the Diet     hydrOXYzine 25 MG tablet  Commonly known as:  ATARAX/VISTARIL  Take 1 tablet (25 mg total) by mouth 3 (three) times daily as needed for anxiety.   Indication:  Anxiety Neurosis     ibuprofen 800 MG tablet  Commonly known as:  ADVIL,MOTRIN  Take 1 tablet (800 mg total) by mouth every 8 (eight) hours as needed for headache or moderate pain.   Indication:  Migraine Headache     levothyroxine 75 MCG tablet  Commonly known as:  SYNTHROID, LEVOTHROID  Take 1 tablet (75 mcg total) by mouth daily before breakfast.   Indication:  Underactive Thyroid     lithium carbonate 300 MG capsule  Take 1 capsule (300 mg total) by mouth 3 (three) times daily with meals.   Indication:  Manic-Depression     OLANZapine 20 MG tablet  Commonly known as:  ZYPREXA  Take 1 tablet (20 mg total) by mouth at bedtime.   Indication:  Schizophrenia     topiramate 200 MG tablet  Commonly known as:  TOPAMAX  Take 1 tablet (200 mg total) by mouth at bedtime.   Indication:   Migraine Headache     traZODone 100 MG tablet  Commonly known as:  DESYREL  Take 1 tablet (100 mg total) by mouth at bedtime.   Indication:  Trouble Sleeping           Follow-up Information    Call [facility] .   Why:  Please arrive on February 17th at 10am for your hospital follow up appointment for medication managment and therapy upon discharge.   Contact information:   [address] [place], [place] [address] Phone: [phone] Fax:[phone] Email:  Fax: [phone]      Follow up with [facility] .   Why:  Please arrive for your cardiology appointment with Dr. Lowell Guitar on March 7th at 1pm for a new patient consult with your medication list i.d. and insurance   Contact information:   [address] [place], [place] [address] [address] Phone: [phone] Fax: [phone]      Follow-up recommendations:  Activity:  as tolerated. Diet:  low sodium heart healthy. Other:  keep follow up appointments.  Comments:    Signed: Kristine Linea, MD 06/18/2015, 6:28 PM

## 2015-06-18 NOTE — Plan of Care (Signed)
Problem: Ineffective individual coping Goal: STG: Pt will be able to identify effective and ineffective STG: Pt will be able to identify effective and ineffective coping patterns  Outcome: Progressing Patient attended all groups today, noted with bright affect and interacted appropriately with peers.

## 2015-06-18 NOTE — Progress Notes (Signed)
D: Pt denies SI/AVH. Stated that she had a nice visit with her family and is looking forward to going home on Monday. Wants to speak to Doctor about prescribed Vistaril before discharge. Affect brighter. Interaction appropriate amongst peers and staff. Seen socializing in dayroom. Ambulating well with walker.  A: Encouragement and support provided. Medications given as prescribed. Q15 minute checks maintained for safety. 1:1 sitter at bedside. R: Pt affect noted to be brighter. Gait noted to be improved. Med compliant. Pt remains safe on unit. Will continue to monitor.

## 2015-06-18 NOTE — Progress Notes (Signed)
Patient in room with sitter up to 0845. An order to d/c sitter called in as patient noted to be more clear, with bright affect, much more steady gait but still using a walker. Patient pleasant, attended all groups, interacted appropriately with peers and staff. Pt denied SI and agreed to talk to verbalize her feelings to nursing staff.

## 2015-06-19 ENCOUNTER — Other Ambulatory Visit (HOSPITAL_COMMUNITY): Payer: Self-pay | Admitting: Psychiatry

## 2015-06-19 NOTE — Progress Notes (Signed)
Recreation Therapy Notes  Date: 02.13.17 Time: 3:00 pm Location: Craft Room  Group Topic: Self-expression  Goal Area(s) Addresses:  Patient will identify one color per emotion listed on wheel. Patient will verbalize benefit of use art as a means of self-expression. Patient will verbalize one emotion experienced during session.  Behavioral Response: Attentive, Interactive  Intervention: Emotion Wheel  Activity: Patients were given an Arboriculturist with 7 different emotions and instructed to pick a color for each emotion.  Education: LRT educated group on different forms of self-expression.  Education Outcome: Acknowledges education/In group clarification offered  Clinical Observations/Feedback: Patient completed activity by picking a color for each emotion. Patient contributed to group discussion by stating what colors she picked for certain emotions and why, what emotions she felt while she was coloring, and what her mind was focused on.  Jacquelynn Cree, LRT/CTRS 06/19/2015 4:42 PM

## 2015-06-19 NOTE — Telephone Encounter (Signed)
Patient is in the hospital

## 2015-06-19 NOTE — Progress Notes (Signed)
D:  Patient expresses readiness for discharge today.  Patient denies any pain currently.  Patient denies suicidal ideation or homicidal ideation.  Patient endorses mild auditory and visual hallucinations currently. A:  Medications and instructions for their use were reviewed with the patient and she voiced understanding.  Discharge instructions and follow up were reviewed with the patient.  Patient's belongings were returned upon her leaving the unit. R:  Patient signed for the return of her belongings.  Patient was cooperative with the discharge process.  Patient expressed understanding of discharge instructions, follow up, medications and their use.  Patient was escorted off the unit.  Patient remains safe at the time of discharge.

## 2015-06-19 NOTE — Progress Notes (Signed)
  [facility] Adult Case Management Discharge Plan :  Will you be returning to the same living situation after discharge:  Yes,  pt will be returning home to [place], [place] to live with her husband At discharge, do you have transportation home?: Yes,  pt will be picked up by her husband Do you have the ability to pay for your medications: Yes,  pt will be provided with prescriptions at discharge  Release of information consent forms completed and in the chart;  Patient's signature needed at discharge.  Patient to Follow up at: Follow-up Information    Call [facility] .   Why:  Please arrive on February 17th at 10am for your hospital follow up appointment for medication managment and therapy upon discharge.   Contact information:   [address] [place], [place] [address] Phone: [phone] Fax:[phone] Email:  Fax: [phone]      Follow up with [facility] .   Why:  Please arrive for your cardiology appointment with Dr. Lowell Guitar on March 7th at 1pm for a new patient consult with your medication list i.d. and insurance   Contact information:   [address] [place], [place] [address] [address] Phone: [phone] Fax: [phone]      Next level of care provider has access to [facility]:no  Safety Planning and Suicide Prevention discussed: Yes,  completed with pt  Have you used any form of tobacco in the last 30 days? (Cigarettes, Smokeless Tobacco, Cigars, and/or Pipes): No  Has patient been referred to the Quitline?: N/A patient is not a smoker  Patient has been referred for addiction treatment: N/A  Danielle [name] 06/19/2015, 9:49 AM

## 2015-06-19 NOTE — Plan of Care (Signed)
Problem: Alteration in thought process Goal: STG-Patient is able to follow short directions Outcome: Progressing Patient was asked to come to medication room for meds and she was able to do so.

## 2015-06-19 NOTE — Progress Notes (Signed)
D: Patient appears much brighter on the unit. She states she's ready for discharge. She says she's stilling having voices and visions but they're back to being manageable. She still rates pain at a 7 in her head. She denies SI/HI.  A: Medication was given with education. Patient was new to [facility] and asked for print out information so that was provided. Encouragement was provided.  R: She was compliant with medication. She has remained calm and cooperative. Safety maintained with 15 min checks.

## 2015-06-19 NOTE — [facility] Group Notes (Signed)
[facility] LCSW Group Therapy  06/19/2015 2:59 PM  Type of Therapy:  Group Therapy  Participation Level:  Active  Participation Quality:  Appropriate and Attentive  Affect:  Appropriate  Cognitive:  Alert, Appropriate and Oriented  Insight:  Engaged  Engagement in Therapy:  Engaged  Modes of Intervention:  Discussion, Socialization and Support  Summary of Progress/Problems: Patient attended and participated in group discussion appropriately introducing herself and sharing during an introductory exercise that her hobby is "collecting [employer] (a local [employer]) memorabilia". Patient shared that she has struggled with anxiety and depression and could not get out of bed and reports that she has a good support system with a husband at home. Patient shared the way she copes and has forced herself to take medications and go to groups when she did not feel like doing anything and was able to offer support to new group members as she is discharging today.    Lulu Riding, MSW, LCSWA 06/19/2015, 2:59 PM

## 2015-06-19 NOTE — [facility] Group Notes (Signed)
[facility] LCSW Aftercare Discharge Planning Group Note   06/19/2015 10:42 AM  Participation Quality:  Patient attended group and participated appropriately introducing herself and sharing her SMART goal is to "discharge home and I have a plan to see the doctor and my husband is supportive."   Mood/Affect:  Appropriate  Depression Rating:  0  Anxiety Rating:  4 patient reports her anxiety stays at a 4 and this is her baseline  Thoughts of Suicide:  No Will you contract for safety?   NA  Current AVH:  No  Plan for Discharge/Comments:  Discharge home with husband and follow up with her outpatient provider.  Transportation Means: husband  Supports:husband, outpatient provider, church  [place], [place] T, MSW, [employer]

## 2015-06-19 NOTE — Tx Team (Signed)
Interdisciplinary Treatment Plan Update (Adult)  Date:  06/19/2015 Time Reviewed:  9:44 AM  Progress in Treatment: Attending groups: Yes Participating in groups:  Yes Taking medication as prescribed:  Yes. Tolerating medication:  Yes. Family/Significant othe contact made:  CSW spoke with the pt's husband Patient understands diagnosis:  Yes. Discussing patient identified problems/goals with staff:  Yes. Medical problems stabilized or resolved:  Yes. Denies suicidal/homicidal ideation: Yes. Issues/concerns per patient self-inventory:  Yes. Other:  New problem(s) identified: No, Describe:  NA  Discharge Plan or Barriers: Pt plans to return home and follow up with outpatient at [facility] for medication management and therapy and with [facility] for her medical hospital follow up  Reason for Continuation of Hospitalization: Depression Hallucinations Medication stabilization Suicidal ideation  Comments:The patient has a long history of schizophrenia. She has been in the care of Dr. Adele Schilder for several years now and relatively stable on a combination of Haldol, Depakote, and trazodone. Recently the patient started complaining of increased paranoia venting her from leaving the house and auditory hallucinations. Dr. Adele Schilder plan to taper the Haldol of and switch the patient to Abilify however the patient could not get her Abilify as of yet due to prior approval process. She became increasingly paranoid, depressed, hallucinating, and thinking of hurting herself. She admits that she does not want to die but has been very frustrated with no access to necessary medications and deterioration of her general health. For extended periods of time she has been unable to walk and has been falling frequently. She describes it as episodes of dizziness. She has been seen by neurologist rule out seizures. She is now under investigation children by cardiologist with a plan to do Holter  monitoring next week. The patient cannot function very well at home. Apparently she does not use a walker but walks along the walls. She's been falling frequently with head bumps and cuts. She is very frustrated because falls happen randomly in most embarrassing circumstances. She has been trying to hurt herself by hitting herself. As she is sitting in the wheelchair she started hitting her legs. She also experiences periods of confusion. She noticed difficulties with her speech. Indeed her speech is slurred. She reports many symptoms of depression with poor sleep, decreased appetite, anhedonia, feeling of guilt and hopelessness worthlessness, poor energy and concentration, social isolation, crying spells and passive suicidal ideation. She reports increased anxiety symptoms of PTSD type. She experiences more hallucinations and paranoia since Haldol dose was lowered. She denies any alcohol or illicit substance use.  Estimated length of stay: 7 days   New goal(s): NA  Review of initial/current patient goals per problem list:   1.  Goal(s): Patient will participate in aftercare plan * Met: Yes * Target date: at discharge * As evidenced by: Patient will participate within aftercare plan AEB aftercare provider and housing plan at discharge being identified. * 2/8: Pt plans to return home and follow up with outpatient at [facility] for medication management and therapy and with [facility] for her medical hospital follow up * 2/13: 2/8: Pt plans to return home and follow up with outpatient at [facility] for medication management and therapy and with [facility] for her medical hospital follow up   2.  Goal (s): Patient will exhibit decreased depressive symptoms and suicidal ideations. * Met: Adequate for discharge per MD.   Target date: at discharge * As evidenced by: Patient will utilize self rating of depression at 3  or below and demonstrate decreased signs of  depression or be deemed stable for discharge by MD. * 2/8: Goal progressing.  Pt denies SI/HI.  Pt reports she is safe for discharge. * 2/13: Adequate for discharge per MD.  Pt denies SI/HI.  Pt reports she is safe for discharge.    3.  Goal(s): Patient will demonstrate decreased signs and symptoms of anxiety. * Met: Adequate for discharge per MD.  Target date: at discharge * As evidenced by: Patient will utilize self rating of anxiety at 3 or below and demonstrated decreased signs of anxiety, or be deemed stable for discharge by MD * 2/8: Goal progressing. * 2/13: Adequate for discharge per MD. *   4.  Goal (s): Patient will demonstrate decreased symptoms of psychosis. * Met: Adequate for discharge per MD.   Target date: at discharge * As evidenced by: Patient will not endorse signs of psychosis or be deemed stable for discharge by MD.  * 2/8: Goal progressing. * 2/13: Adequate for discharge per MD.  Attendees: Patient:   2/13/20179:44 AM  Family:   2/13/20179:44 AM  Physician:   Dr. Bary Leriche  2/13/20179:44 AM  Nursing:   , RN 2/13/20179:44 AM  Case Manager:   2/13/20179:44 AM  Counselor:   2/13/20179:44 AM  Other:  Claudine Mouton, LCSWA 2/13/20179:44 AM  Nursing:   Lucile Shutters, RN 2/13/20179:44 AM  Nursing:   Nicanor Bake, RN 2/13/20179:44 AM  Other:  2/13/20179:44 AM  Other:  2/13/20179:44 AM  Other:  2/13/20179:44 AM  Other:  2/13/20179:44 AM  Other:  2/13/20179:44 AM  Other:  2/13/20179:44 AM  Other:   2/13/20179:44 AM   Scribe for Treatment Team:   Alphonse Guild Axel Meas,MSW, LCSWA  06/19/2015, 9:44 AM

## 2015-06-20 NOTE — Progress Notes (Signed)
Recreation Therapy Notes  INPATIENT RECREATION TR PLAN  Patient Details Name: RAYSSA ATHA MRN: [id number] DOB: Jul 11, 1970 Today's Date: 06/20/2015  Rec Therapy Plan Is patient appropriate for Therapeutic Recreation?: Yes Treatment times per week: At least once a week TR Treatment/Interventions: 1:1 session, Group participation (Comment) (Appropriate participation in daily recreation therapy tx)  Discharge Criteria Pt will be discharged from therapy if:: Treatment goals are met, Discharged Treatment plan/goals/alternatives discussed and agreed upon by:: Patient/family  Discharge Summary Short term goals set: See Care Plan Short term goals met: Complete Progress toward goals comments: One-to-one attended Which groups?: Self-esteem, Wellness, Leisure education, Other (Comment) (Self-expression) One-to-one attended: Self-esteem, stress management Reason goals not met: N/A Therapeutic equipment acquired: None Reason patient discharged from therapy: Discharge from hospital Pt/family agrees with progress & goals achieved: Yes Date patient discharged from therapy: 06/19/15   Leonette Monarch, LRT/CTRS 06/20/2015, 8:35 AM

## 2015-06-23 ENCOUNTER — Encounter (HOSPITAL_COMMUNITY): Payer: Self-pay | Admitting: Psychiatry

## 2015-06-23 ENCOUNTER — Ambulatory Visit (INDEPENDENT_AMBULATORY_CARE_PROVIDER_SITE_OTHER): Payer: BLUE CROSS/BLUE SHIELD | Admitting: Psychiatry

## 2015-06-23 VITALS — BP 127/85 | HR 77 | Ht 71.0 in | Wt 214.0 lb

## 2015-06-23 DIAGNOSIS — F200 Paranoid schizophrenia: Secondary | ICD-10-CM

## 2015-06-23 DIAGNOSIS — F4310 Post-traumatic stress disorder, unspecified: Secondary | ICD-10-CM | POA: Diagnosis not present

## 2015-06-23 MED ORDER — BUSPIRONE HCL 5 MG PO TABS: 5.0000 mg | ORAL_TABLET | Freq: Two times a day (BID) | ORAL | Status: DC

## 2015-06-23 MED ORDER — OLANZAPINE 20 MG PO TABS: 20.0000 mg | ORAL_TABLET | Freq: Every day | ORAL | Status: DC

## 2015-06-23 MED ORDER — TRAZODONE HCL 100 MG PO TABS: 100.0000 mg | ORAL_TABLET | Freq: Every day | ORAL | Status: DC

## 2015-06-23 MED ORDER — LITHIUM CARBONATE 300 MG PO CAPS: 300.0000 mg | ORAL_CAPSULE | Freq: Three times a day (TID) | ORAL | Status: DC

## 2015-06-23 MED ORDER — ARIPIPRAZOLE 20 MG PO TABS: 20.0000 mg | ORAL_TABLET | Freq: Every day | ORAL | Status: DC
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
## 2015-06-29 ENCOUNTER — Telehealth (HOSPITAL_COMMUNITY): Payer: Self-pay

## 2015-06-29 NOTE — Telephone Encounter (Signed)
Fax received form [facility] and Aripiprazole  has been approved from 06/28/2015 - 06/27/2016

## 2015-07-03 ENCOUNTER — Ambulatory Visit (INDEPENDENT_AMBULATORY_CARE_PROVIDER_SITE_OTHER): Payer: BLUE CROSS/BLUE SHIELD | Admitting: Clinical

## 2015-07-03 ENCOUNTER — Encounter (HOSPITAL_COMMUNITY): Payer: Self-pay | Admitting: Clinical

## 2015-07-03 DIAGNOSIS — F200 Paranoid schizophrenia: Secondary | ICD-10-CM | POA: Diagnosis not present

## 2015-07-03 DIAGNOSIS — F4310 Post-traumatic stress disorder, unspecified: Secondary | ICD-10-CM

## 2015-07-03 NOTE — Progress Notes (Signed)
   THERAPIST PROGRESS NOTE  Session Time: 4:32 -5:30  Participation Level: Active  Behavioral Response: CasualAlertexcited  Type of Therapy: Individual Therapy  Treatment Goals addressed: Improve Psychiatric Symptoms, Emotional Regulation Skills, Calming Skills, Healthy Coping Skills  Interventions: Motivational Interviewing, Cognitive Behavior Therapy and Grounding/Mindfulness Skills  Summary: Danielle [name] is a 45 y.o. female who presents with Schizophrenia and PTSD.   Suicidal/Homicidal: No - without intent/plan  Therapist Response:  Danielle [name] met with clinician for an individual session. Danielle [name]'s husband joined the session to support her. Danielle [name] discussed her psychiatric symptoms, her current life events. Danielle [name] shared that she had been in the hospital after last session. She shared that he symptoms increased after last session. She shared that in the hospital some of her levels were too high which increased her symptoms. She shared that they adjusted her medications and she was doing better. She was very happy and alert. She stated that she was able to participate in a cook out with her family yesterday and this made her very happy. Danielle [name] stated that the difficult time for her is when everyone is gone. Client and clinician reviewed the coping skills she is currently using. Grounding and mindfulness techniques ( which help calm her and interrupt negative thoughts). Danielle [name] asked for additional skills. Client and clinician discussed how she currently spends her day and when her symptoms are more severe. Clinician introduced some additional skills. Danielle [name] shared about the ones she was most likely to use. Client and clinician also discussed the possibility of crafting or taking up a hobby. Danielle [name] shared that she does word searches but additional hobbies would help. Client and clinician discussed additional possibilities. Client and clinician discussed possibly of listening to music rather than TV so that  she could move around and be more active. Danielle [name] agreed to try the things discussed and report back on her experience.   Plan: Return again in 2 weeks.  Diagnosis:     Axis I: Schizophrenia and PTSD    [name], LCSW 07/03/2015

## 2015-07-12 ENCOUNTER — Encounter (HOSPITAL_COMMUNITY): Payer: Self-pay | Admitting: Clinical

## 2015-07-12 ENCOUNTER — Ambulatory Visit (INDEPENDENT_AMBULATORY_CARE_PROVIDER_SITE_OTHER): Payer: BLUE CROSS/BLUE SHIELD | Admitting: Clinical

## 2015-07-12 DIAGNOSIS — F4310 Post-traumatic stress disorder, unspecified: Secondary | ICD-10-CM

## 2015-07-12 DIAGNOSIS — F200 Paranoid schizophrenia: Secondary | ICD-10-CM | POA: Diagnosis not present
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
## 2015-07-15 ENCOUNTER — Other Ambulatory Visit (HOSPITAL_COMMUNITY): Payer: Self-pay | Admitting: Psychiatry

## 2015-07-18 ENCOUNTER — Telehealth (HOSPITAL_COMMUNITY): Payer: Self-pay

## 2015-07-18 ENCOUNTER — Other Ambulatory Visit (HOSPITAL_COMMUNITY): Payer: Self-pay | Admitting: Psychiatry

## 2015-07-18 ENCOUNTER — Ambulatory Visit (HOSPITAL_COMMUNITY): Payer: Self-pay | Admitting: Clinical

## 2015-07-18 DIAGNOSIS — F200 Paranoid schizophrenia: Secondary | ICD-10-CM

## 2015-07-18 MED ORDER — ARIPIPRAZOLE 20 MG PO TABS: 20.0000 mg | ORAL_TABLET | Freq: Every day | ORAL | Status: DC

## 2015-07-18 NOTE — Telephone Encounter (Signed)
Met with Dr. Adele Schilder who approved a one time refill of patient's Abilify 20 mg, one a day, #30 with no refills and e-scribed order as authorized to patient's [facility] in [place].

## 2015-07-24 ENCOUNTER — Ambulatory Visit (HOSPITAL_COMMUNITY): Payer: Self-pay | Admitting: Clinical

## 2015-07-26 ENCOUNTER — Ambulatory Visit (INDEPENDENT_AMBULATORY_CARE_PROVIDER_SITE_OTHER): Payer: BLUE CROSS/BLUE SHIELD | Admitting: Clinical

## 2015-07-26 ENCOUNTER — Encounter (HOSPITAL_COMMUNITY): Payer: Self-pay | Admitting: Clinical

## 2015-07-26 DIAGNOSIS — F200 Paranoid schizophrenia: Secondary | ICD-10-CM | POA: Diagnosis not present

## 2015-07-26 DIAGNOSIS — F4310 Post-traumatic stress disorder, unspecified: Secondary | ICD-10-CM
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
## 2015-08-02 ENCOUNTER — Ambulatory Visit (HOSPITAL_COMMUNITY): Payer: Self-pay | Admitting: Clinical

## 2015-08-04 ENCOUNTER — Ambulatory Visit (INDEPENDENT_AMBULATORY_CARE_PROVIDER_SITE_OTHER): Payer: BLUE CROSS/BLUE SHIELD | Admitting: Psychiatry

## 2015-08-04 ENCOUNTER — Encounter (HOSPITAL_COMMUNITY): Payer: Self-pay | Admitting: Psychiatry

## 2015-08-04 VITALS — BP 116/82 | HR 91 | Ht 71.0 in | Wt 229.4 lb

## 2015-08-04 DIAGNOSIS — F4310 Post-traumatic stress disorder, unspecified: Secondary | ICD-10-CM

## 2015-08-04 DIAGNOSIS — F200 Paranoid schizophrenia: Secondary | ICD-10-CM | POA: Diagnosis not present

## 2015-08-04 MED ORDER — BUSPIRONE HCL 5 MG PO TABS: 5.0000 mg | ORAL_TABLET | Freq: Two times a day (BID) | ORAL | Status: DC

## 2015-08-04 MED ORDER — LITHIUM CARBONATE 300 MG PO CAPS: 300.0000 mg | ORAL_CAPSULE | Freq: Three times a day (TID) | ORAL | Status: DC

## 2015-08-04 MED ORDER — TRAZODONE HCL 100 MG PO TABS: 100.0000 mg | ORAL_TABLET | Freq: Every day | ORAL | Status: DC

## 2015-08-04 MED ORDER — OLANZAPINE 10 MG PO TABS: 10.0000 mg | ORAL_TABLET | Freq: Every day | ORAL | Status: DC

## 2015-08-04 MED ORDER — ARIPIPRAZOLE 20 MG PO TABS: 20.0000 mg | ORAL_TABLET | Freq: Every day | ORAL | Status: DC
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
## 2015-08-07 ENCOUNTER — Ambulatory Visit (HOSPITAL_COMMUNITY): Payer: Self-pay | Admitting: Clinical

## 2015-08-09 ENCOUNTER — Ambulatory Visit (HOSPITAL_COMMUNITY): Payer: Self-pay | Admitting: Clinical

## 2015-08-21 ENCOUNTER — Telehealth (HOSPITAL_COMMUNITY): Payer: Self-pay

## 2015-08-21 ENCOUNTER — Ambulatory Visit (HOSPITAL_COMMUNITY): Payer: Self-pay | Admitting: Clinical

## 2015-08-21 NOTE — Telephone Encounter (Signed)
I called the patients husband back and let him know that it would be okay to go up to 15 mg for now.

## 2015-08-21 NOTE — Telephone Encounter (Signed)
She can try 15 mg Zyprexa because 20 mg was making her tremulous and falling

## 2015-08-21 NOTE — Telephone Encounter (Signed)
Patients husband is calling, he said that at the last visit you reduced the Zyprexa to 10 mg and now patient is having a hard time sleeping. He wants to know if they can go back up to the 20 mg. Please review and advise, thank you

## 2015-08-24 ENCOUNTER — Ambulatory Visit (INDEPENDENT_AMBULATORY_CARE_PROVIDER_SITE_OTHER): Payer: BLUE CROSS/BLUE SHIELD | Admitting: Clinical

## 2015-08-24 ENCOUNTER — Encounter (HOSPITAL_COMMUNITY): Payer: Self-pay | Admitting: Clinical

## 2015-08-24 DIAGNOSIS — F4310 Post-traumatic stress disorder, unspecified: Secondary | ICD-10-CM | POA: Diagnosis not present

## 2015-08-24 DIAGNOSIS — F200 Paranoid schizophrenia: Secondary | ICD-10-CM | POA: Diagnosis not present

## 2015-08-24 NOTE — Progress Notes (Signed)
   THERAPIST PROGRESS NOTE  Session Time: 4:33 - 5:30  Participation Level: Active  Behavioral Response: NeatAlertNA and Depressed  Type of Therapy: Individual Therapy  Treatment Goals addressed: Improve Psychiatric Symptoms, Emotional Regulation Skills,  Healthy Coping Skills, discuss and process traumatic event,   Interventions: Motivational Interviewing, Cognitive Behavior Therapy   Summary: REMMIE BEMBENEK is a 45 y.o. female who presents with Schizophrenia and PTSD.   Suicidal/Homicidal: No - without intent/plan  Therapist Response:  Bettymae met with clinician for an individual session.  Kamya discussed her psychiatric symptoms, her current life events and her homework. Clayton shared that she has been practicing her grounding techniques. She shared that she enjoyed her vacation to [place]  To visit her Mother. She shared that she has been having some nightmares (PTSD) but that the nightmares are changing. Client and clinician discussed her thoughts and feelings about the nightmares. Client and clinician agreed that she was doing very good work. Kaelan was able to attend the session without her husband or another support. Keyondra also shared that she was feeling some depression since returning from [place] because her daughter has been having some difficulties and getting into trouble.  Zahra also shared that she was concerned because she received a letter saying that she had an issue with her breast. Client and clinician discussed what was in Hanalei's power to control and what was not. Client and clinician discussed healthy coping and emotional regulation skills. Krystalle reviewed the ones that work for her and clinician offered some reminders of what she has used in the past that helped her. Though Lilee was depressed about her daughter and worried about her health she was overall in a better mood and able to discuss difficult things without being overwhelmed. Heer announced to her husband in the  waiting room that she had done really good work today  Plan: Return again in 2 weeks.  Diagnosis:     Axis I: Schizophrenia, paranoid type and PTSD    Kingjames Coury A, LCSW 08/24/2015

## 2015-09-01 ENCOUNTER — Other Ambulatory Visit (HOSPITAL_COMMUNITY): Payer: Self-pay | Admitting: Psychiatry

## 2015-09-04 ENCOUNTER — Other Ambulatory Visit (HOSPITAL_COMMUNITY): Payer: Self-pay | Admitting: Psychiatry

## 2015-09-04 ENCOUNTER — Telehealth (HOSPITAL_COMMUNITY): Payer: Self-pay

## 2015-09-04 ENCOUNTER — Ambulatory Visit (HOSPITAL_COMMUNITY): Payer: Self-pay | Admitting: Clinical

## 2015-09-04 DIAGNOSIS — F200 Paranoid schizophrenia: Secondary | ICD-10-CM

## 2015-09-04 MED ORDER — OLANZAPINE 10 MG PO TABS: 10.0000 mg | ORAL_TABLET | Freq: Every day | ORAL | Status: DC

## 2015-09-04 MED ORDER — BUSPIRONE HCL 5 MG PO TABS: 5.0000 mg | ORAL_TABLET | Freq: Two times a day (BID) | ORAL | Status: DC

## 2015-09-04 MED ORDER — ARIPIPRAZOLE 20 MG PO TABS: 20.0000 mg | ORAL_TABLET | Freq: Every day | ORAL | Status: DC

## 2015-09-04 NOTE — Telephone Encounter (Signed)
Dr. Lolly MustacheArfeen sent prescriptions to the pharmacy

## 2015-09-04 NOTE — Telephone Encounter (Signed)
Patients husband is calling, Patient needs a refill on Zyprexa, Buspar, and Abilify. She will be out today, patient has a follow up appointment on 5/5 - please review and advise, thank you

## 2015-09-07 ENCOUNTER — Ambulatory Visit (HOSPITAL_COMMUNITY): Payer: Self-pay | Admitting: Clinical

## 2015-09-08 ENCOUNTER — Ambulatory Visit (INDEPENDENT_AMBULATORY_CARE_PROVIDER_SITE_OTHER): Payer: BLUE CROSS/BLUE SHIELD | Admitting: Psychiatry

## 2015-09-08 ENCOUNTER — Encounter (HOSPITAL_COMMUNITY): Payer: Self-pay | Admitting: Psychiatry

## 2015-09-08 VITALS — BP 130/82 | HR 74 | Ht 71.0 in | Wt 241.6 lb

## 2015-09-08 DIAGNOSIS — F4310 Post-traumatic stress disorder, unspecified: Secondary | ICD-10-CM

## 2015-09-08 DIAGNOSIS — F200 Paranoid schizophrenia: Secondary | ICD-10-CM | POA: Diagnosis not present

## 2015-09-08 MED ORDER — LITHIUM CARBONATE 300 MG PO CAPS: 300.0000 mg | ORAL_CAPSULE | Freq: Three times a day (TID) | ORAL | Status: DC

## 2015-09-08 MED ORDER — TRAZODONE HCL 100 MG PO TABS: 100.0000 mg | ORAL_TABLET | Freq: Every day | ORAL | Status: DC

## 2015-09-08 MED ORDER — CHLORPROMAZINE HCL 25 MG PO TABS: 25.0000 mg | ORAL_TABLET | Freq: Every day | ORAL | Status: DC
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
## 2015-09-21 ENCOUNTER — Encounter (HOSPITAL_COMMUNITY): Payer: Self-pay | Admitting: Clinical

## 2015-09-21 ENCOUNTER — Ambulatory Visit (INDEPENDENT_AMBULATORY_CARE_PROVIDER_SITE_OTHER): Payer: BLUE CROSS/BLUE SHIELD | Admitting: Clinical

## 2015-09-21 DIAGNOSIS — F4310 Post-traumatic stress disorder, unspecified: Secondary | ICD-10-CM

## 2015-09-21 DIAGNOSIS — F200 Paranoid schizophrenia: Secondary | ICD-10-CM | POA: Diagnosis not present
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
## 2015-09-22 ENCOUNTER — Other Ambulatory Visit (HOSPITAL_COMMUNITY): Payer: Self-pay | Admitting: Psychiatry

## 2015-09-22 ENCOUNTER — Telehealth (HOSPITAL_COMMUNITY): Payer: Self-pay

## 2015-09-22 DIAGNOSIS — F200 Paranoid schizophrenia: Secondary | ICD-10-CM

## 2015-09-22 MED ORDER — LITHIUM CARBONATE 300 MG PO CAPS: 300.0000 mg | ORAL_CAPSULE | Freq: Three times a day (TID) | ORAL | Status: DC

## 2015-09-22 NOTE — Telephone Encounter (Signed)
Patients husband calling the pharmacy is requesting a 90 day on the Lithium - okay to send?

## 2015-09-22 NOTE — Telephone Encounter (Signed)
This was completed by Dr. Lolly MustacheArfeen

## 2015-09-27 ENCOUNTER — Other Ambulatory Visit (HOSPITAL_COMMUNITY): Payer: Self-pay | Admitting: Psychiatry

## 2015-09-29 ENCOUNTER — Encounter (HOSPITAL_COMMUNITY): Payer: Self-pay | Admitting: Psychiatry

## 2015-09-29 ENCOUNTER — Ambulatory Visit (INDEPENDENT_AMBULATORY_CARE_PROVIDER_SITE_OTHER): Payer: BLUE CROSS/BLUE SHIELD | Admitting: Psychiatry

## 2015-09-29 VITALS — BP 118/70 | HR 82 | Ht 71.0 in | Wt 240.8 lb

## 2015-09-29 DIAGNOSIS — F200 Paranoid schizophrenia: Secondary | ICD-10-CM | POA: Diagnosis not present

## 2015-09-29 DIAGNOSIS — F4310 Post-traumatic stress disorder, unspecified: Secondary | ICD-10-CM

## 2015-09-29 MED ORDER — ARIPIPRAZOLE 20 MG PO TABS: 20.0000 mg | ORAL_TABLET | Freq: Every day | ORAL | Status: DC

## 2015-09-29 MED ORDER — HYDROXYZINE HCL 25 MG PO TABS: 25.0000 mg | ORAL_TABLET | Freq: Every day | ORAL | Status: DC

## 2015-09-29 MED ORDER — BUSPIRONE HCL 5 MG PO TABS: 5.0000 mg | ORAL_TABLET | Freq: Two times a day (BID) | ORAL | Status: DC
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
## 2015-10-01 ENCOUNTER — Other Ambulatory Visit (HOSPITAL_COMMUNITY): Payer: Self-pay | Admitting: Psychiatry

## 2015-10-05 ENCOUNTER — Other Ambulatory Visit (HOSPITAL_COMMUNITY): Payer: Self-pay | Admitting: Psychiatry

## 2015-10-18 ENCOUNTER — Encounter (HOSPITAL_COMMUNITY): Payer: Self-pay | Admitting: Clinical

## 2015-10-18 ENCOUNTER — Telehealth (HOSPITAL_COMMUNITY): Payer: Self-pay

## 2015-10-18 ENCOUNTER — Ambulatory Visit (INDEPENDENT_AMBULATORY_CARE_PROVIDER_SITE_OTHER): Payer: BLUE CROSS/BLUE SHIELD | Admitting: Clinical

## 2015-10-18 DIAGNOSIS — F4310 Post-traumatic stress disorder, unspecified: Secondary | ICD-10-CM

## 2015-10-18 DIAGNOSIS — F200 Paranoid schizophrenia: Secondary | ICD-10-CM | POA: Diagnosis not present

## 2015-10-18 NOTE — Progress Notes (Signed)
   THERAPIST PROGRESS NOTE  Session Time: 4:30 - 5:28  Participation Level: Active  Behavioral Response: DisheveledConfusedAnxious and Depressed having hallucinations  Type of Therapy: Individual Therapy  Treatment Goals addressed: Improve Psychiatric Symptoms, Emotional Regulation Skills, Calming Skills, Healthy Coping Skills,   Interventions: Motivational Interviewing,  Grounding/Mindfulness Skills, psychoeducation  Summary: Danielle [name] is a 45 y.o. female who presents with Schizophrenia and PTSD.   Suicidal/Homicidal: No - without intent/plan  Therapist Response:  [name] met with clinician for an individual session. [name]'s husband joined the session to support her. [name] discussed her psychiatric symptoms and her current life events. [name] shared that she has been scared a lot lately. She shared her symptoms have gotten worse since last session. She has been experiencing panic attacks even when others are home. She shared that she feels like she should be happy and listed some of the good things in her life, she smiled as she did but then became agitated and began staring off and hyperventilating. Client, clinician and husband did 4-7-8 breathing together and she calmed some and returned to normal breathing. This happened twice during session. Client, clinician and husband discussed the fact that she changed medicine due to the amount of weight she had been gaining. They shared that her symptoms have been increasing. [name] denied any current suicidal or homicidal ideation. Client, clinician and husband reviewed what she should due should she become suicidal or have increased symptoms. [name] stated that she has an appointment with her psychiatrist and would like to remain at home until then if she could.  Plan: Return again in 2 weeks.  Diagnosis:     Axis I: Schizophrenia, paranoid type and PTSD    Crystin Lechtenberg A, LCSW 10/18/2015

## 2015-10-18 NOTE — Telephone Encounter (Signed)
Patient came in to see Tomma LightningFrankie today and her husband came to my office to discuss medications. He states patient has been having up to 5-6 panic attacks during the day and is having nightmares that keep her up at night. They tried increasing the Visteril to 50 mg, but that did not help. Husband states that she was much better on the Zyprexa, but she can no longer take it. Please review and advise, thank you

## 2015-10-19 MED ORDER — OLANZAPINE 5 MG PO TABS: 5.0000 mg | ORAL_TABLET | Freq: Every day | ORAL | Status: DC

## 2015-10-19 NOTE — Telephone Encounter (Signed)
I spoke with dr. Lolly MustacheArfeen, he is going to start the patient back on Zyprexa 5 mg 1 po qd as needed. I called patients husband and told him what the new order was. I explained that this is as needed and that patient will need to watch what she eats. He voiced his understanding and said they will try it for a week and then call me back to let me know.

## 2015-10-24 ENCOUNTER — Telehealth (HOSPITAL_COMMUNITY): Payer: Self-pay

## 2015-10-24 NOTE — Telephone Encounter (Signed)
Called patients husband and let him know - I expressed the importance of a healthy diet as the patient gained quite a bit of weight last time she was on the Zyprexa. He voiced his understanding and said he would call back if this does not work.
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
## 2015-10-24 NOTE — Telephone Encounter (Signed)
She can try Zyprexa 10 mg

## 2015-11-08 ENCOUNTER — Ambulatory Visit (INDEPENDENT_AMBULATORY_CARE_PROVIDER_SITE_OTHER): Payer: BLUE CROSS/BLUE SHIELD | Admitting: Clinical

## 2015-11-08 ENCOUNTER — Other Ambulatory Visit (HOSPITAL_COMMUNITY): Payer: Self-pay

## 2015-11-08 DIAGNOSIS — F200 Paranoid schizophrenia: Secondary | ICD-10-CM | POA: Diagnosis not present

## 2015-11-08 DIAGNOSIS — F4310 Post-traumatic stress disorder, unspecified: Secondary | ICD-10-CM

## 2015-11-08 MED ORDER — OLANZAPINE 7.5 MG PO TABS: 15.0000 mg | ORAL_TABLET | Freq: Every day | ORAL | Status: DC

## 2015-11-08 NOTE — Telephone Encounter (Signed)
Patient came in to see Tomma LightningFrankie today and reports having hallucinations, panic attacks, not sleeping. I spoke with Dr. Lolly MustacheArfeen about this and he had me change the patients Zyprexa from 5 mg 1 po qd to 15 mg 1 po qd. The new order was sent to the pharmacy and the patient was informed

## 2015-11-10 ENCOUNTER — Ambulatory Visit (INDEPENDENT_AMBULATORY_CARE_PROVIDER_SITE_OTHER): Payer: BLUE CROSS/BLUE SHIELD | Admitting: Psychiatry

## 2015-11-10 ENCOUNTER — Encounter (HOSPITAL_COMMUNITY): Payer: Self-pay | Admitting: Psychiatry

## 2015-11-10 VITALS — BP 120/72 | HR 70 | Ht 71.0 in | Wt 242.6 lb

## 2015-11-10 DIAGNOSIS — F200 Paranoid schizophrenia: Secondary | ICD-10-CM | POA: Diagnosis not present

## 2015-11-10 MED ORDER — LITHIUM CARBONATE 300 MG PO CAPS: 300.0000 mg | ORAL_CAPSULE | Freq: Three times a day (TID) | ORAL | Status: DC

## 2015-11-10 MED ORDER — ARIPIPRAZOLE 30 MG PO TABS: 30.0000 mg | ORAL_TABLET | Freq: Every day | ORAL | Status: DC

## 2015-11-10 MED ORDER — TRAZODONE HCL 100 MG PO TABS: 100.0000 mg | ORAL_TABLET | Freq: Every day | ORAL | Status: DC

## 2015-11-10 MED ORDER — HYDROXYZINE HCL 25 MG PO TABS: 25.0000 mg | ORAL_TABLET | Freq: Every day | ORAL | Status: DC

## 2015-11-10 MED ORDER — CLONAZEPAM 1 MG PO TABS: ORAL_TABLET | ORAL | Status: DC

## 2015-11-10 MED ORDER — BUSPIRONE HCL 5 MG PO TABS: 5.0000 mg | ORAL_TABLET | Freq: Two times a day (BID) | ORAL | Status: DC
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
## 2015-11-12 ENCOUNTER — Encounter (HOSPITAL_COMMUNITY): Payer: Self-pay | Admitting: Clinical

## 2015-11-12 NOTE — Progress Notes (Signed)
   THERAPIST PROGRESS NOTE  Session Time: 4:30 -5:25  Participation Level: Active  Behavioral Response: DisheveledConfusedAnxious and Depressed having hallucinations  Type of Therapy: Individual Therapy  Treatment Goals addressed: Improve Psychiatric Symptoms, Emotional Regulation Skills, Calming Skills, Healthy Coping Skills,   Interventions: Motivational Interviewing, Grounding/Mindfulness Skills, psychoeducation  Summary: Danielle [name] is a 45 y.o. female who presents with Schizophrenia and PTSD.   Suicidal/Homicidal: No - without intent/plan  Therapist Response:  Danielle [name] met with clinician for an individual session. Danielle [name]'s son joined the session to support her. Danielle [name] discussed her psychiatric symptoms and her current life events. Danielle [name] shared that she continues to be scared. She shared she has had increased hallucination, nightmares and panic attacks. Clinician spoke to the nurse who conveyed the information to psychiatrist. She will be seeing him on Friday. Sawsan and clinician discussed the possibility  Of going inpatient to get her meds regulated. Danielle [name] stated that she did not want to go inpatient as she is not currently suicidal or homicidal and she wants to be out for her birthday and because her daughter is going to court. Client and clinician discussed her safety plan. Danielle [name] and clinician discussed and reviewed grounding and coping techniques. Client, her son and clinician practiced some grounding techniques together. Danielle [name] shared she was glad her son would know them because he could help her when she was home. Client and clinician discussed her daughters upcoming court date because she shared it was adding a lot of stress and worry and causing a lot of anxiety for Danielle [name]. Through discussion Danielle [name] was able to recognize that her daughters out come was dependent on her daughter and that she could continue to love her, but that her daughter had to take responsibility for her actions.  Client and clinician discussed strategies she could use to interrupt catastrophic thought and replace them with more realistic, healthier thoughts. Danielle [name], son and clinician reviewed and practiced grounding techniques. Dayami's husband arrived after session and he and clinician reviewed what to do if her symptoms increased or became unmanageable.     Plan: Return again in 2 weeks.  Diagnosis:Axis I: Schizophrenia, paranoid type and PTSD    Aldona Bryner A, LCSW 11/08/15

## 2015-11-22 ENCOUNTER — Ambulatory Visit (INDEPENDENT_AMBULATORY_CARE_PROVIDER_SITE_OTHER): Payer: BLUE CROSS/BLUE SHIELD | Admitting: Clinical

## 2015-11-22 ENCOUNTER — Encounter (HOSPITAL_COMMUNITY): Payer: Self-pay | Admitting: Clinical

## 2015-11-22 DIAGNOSIS — F200 Paranoid schizophrenia: Secondary | ICD-10-CM

## 2015-11-22 DIAGNOSIS — F4310 Post-traumatic stress disorder, unspecified: Secondary | ICD-10-CM

## 2015-11-22 NOTE — Progress Notes (Signed)
   THERAPIST PROGRESS NOTE  Session Time: 4:25 - 5:25  Participation Level: Active  Behavioral Response: CasualAlertDepressed and hallucinations  Type of Therapy: Individual Therapy  Treatment Goals addressed: Improve Psychiatric Symptoms, Emotional Regulation Skills, Calming Skills, Healthy Coping Skills, discuss and process traumatic event, reduced irrational worries and fears  Interventions: Motivational Interviewing, Cognitive Behavior Therapy and Grounding/Mindfulness Skills, psychoeducation  Summary: Danielle [name] is a 45 y.o. female who presents with Schizophrenia and PTSD.   Suicidal/Homicidal: No - without intent/plan  Therapist Response:  [name] met with clinician for an individual session.[name] discussed her psychiatric symptoms, her current life events and her homework. [name] shared that she has been practicing her 478 breathing and her 10x3 category game sometimes and it helps if her symptoms are not too bad. [name]'s speech was slurred (possible side affect of her medications).  [name]  shared that she has continued to have a lot of psychiatric symptoms - hallucinations (voices) horrible sleep , anxiety and nightmares, in addition to passive suicidal thoughts.  She shared  About the difficulty she was having coping with her symptoms. Client and clinician did a grounding technique together. [name] shared about some of the good events in her life.  [name] would close her eyes and talk to someone clinician couldn't see. [name] became frightened. Clinician suggest client and clinician go outside together. ([name]'s husband was not in session and would pick her up after). [name] and clinician went outside in where her husband would see [place] but we could continue to talk privately. [name] relaxed as client and clinician played I spy (grounding) outside. She shared that it felt better to be outside. [name] and clinician discussed whether or not inpatient was necessary to adjust her meds. She denied  any current suicidal or homicidal ideation He husband arrived this was also discussed with him. He shared that the meds were being adjusted and he would follow through with their safety plan if she did not improve.   Plan: Return again in 2 weeks.  Diagnosis:     Axis I: Schizophrenia, paranoid type and PTSD   Windel Keziah A, LCSW 11/22/2015

## 2015-12-06 ENCOUNTER — Ambulatory Visit (INDEPENDENT_AMBULATORY_CARE_PROVIDER_SITE_OTHER): Payer: BLUE CROSS/BLUE SHIELD | Admitting: Clinical

## 2015-12-06 DIAGNOSIS — F200 Paranoid schizophrenia: Secondary | ICD-10-CM | POA: Diagnosis not present

## 2015-12-06 DIAGNOSIS — F4310 Post-traumatic stress disorder, unspecified: Secondary | ICD-10-CM

## 2015-12-06 NOTE — Progress Notes (Signed)
   THERAPIST PROGRESS NOTE  Session Time: 4:30 -4:55  Participation Level: Active  Behavioral Response: CasualAlertAnxious and Depressed  Type of Therapy: Individual Therapy  Treatment Goals addressed: Improve Psychiatric Symptoms, Emotional Regulation Skills,   Interventions: Motivational Interviewing,  and Grounding/Mindfulness Skills,   Summary: Danielle [name] is a 45 y.o. female who presents with Schizophrenia and PTSD.   Suicidal/Homicidal: No - without intent/plan  Therapist Response:  Danielle [name] met with clinician for an individual session. Danielle [name]'s husband joined the session toward the end of the session to support her. Danielle [name] discussed her psychiatric symptoms and her current life events. Danielle [name] shared that she has not been doing well. She shared that her medications are not working well and she has not yet been able to get in with a psychiatrist. Client and clinician discussed her symptoms - slurred speech, nightmares, hallucinations. Danielle [name] became frightened in the session because of a hallucination. Client and clinician did a grounding technique together. Clinician, client and husband discussed the possibility of inpatient care for med management. Danielle [name] shared that she did not want to go inpatient and that she was neither suicidal or homicidal. Nurse was not available at this time. Clinician stated she would talk to nurse in the morning to get help for Danielle [name]. Clinician encouraged her and her husband to also call nurse and follow through. Client clinician and husband discussed safety plan.    Plan: Return again in 2 weeks.  Diagnosis:     Axis I: Schizophrenia, paranoid type and PTSD    Danielle [name] A, LCSW 12/06/2015

## 2015-12-08 ENCOUNTER — Ambulatory Visit (INDEPENDENT_AMBULATORY_CARE_PROVIDER_SITE_OTHER): Payer: BLUE CROSS/BLUE SHIELD | Admitting: Psychiatry

## 2015-12-08 ENCOUNTER — Encounter (HOSPITAL_COMMUNITY): Payer: Self-pay | Admitting: Clinical

## 2015-12-08 ENCOUNTER — Encounter (HOSPITAL_COMMUNITY): Payer: Self-pay | Admitting: Psychiatry

## 2015-12-08 VITALS — BP 118/72 | HR 66 | Ht 71.0 in | Wt 242.0 lb

## 2015-12-08 DIAGNOSIS — F4310 Post-traumatic stress disorder, unspecified: Secondary | ICD-10-CM

## 2015-12-08 DIAGNOSIS — F200 Paranoid schizophrenia: Secondary | ICD-10-CM

## 2015-12-08 MED ORDER — BUSPIRONE HCL 10 MG PO TABS: 10.0000 mg | ORAL_TABLET | Freq: Three times a day (TID) | ORAL | Status: DC

## 2015-12-08 MED ORDER — LITHIUM CARBONATE 300 MG PO CAPS: 300.0000 mg | ORAL_CAPSULE | Freq: Three times a day (TID) | ORAL | 0 refills | Status: DC

## 2015-12-08 MED ORDER — DIAZEPAM 5 MG PO TABS: 5.0000 mg | ORAL_TABLET | Freq: Every day | ORAL | 0 refills | Status: DC

## 2015-12-08 MED ORDER — PRAZOSIN HCL 1 MG PO CAPS: 1.0000 mg | ORAL_CAPSULE | Freq: Every day | ORAL | 1 refills | Status: DC

## 2015-12-08 MED ORDER — ARIPIPRAZOLE 30 MG PO TABS: 30.0000 mg | ORAL_TABLET | Freq: Every day | ORAL | 0 refills | Status: DC
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
## 2015-12-20 ENCOUNTER — Ambulatory Visit (INDEPENDENT_AMBULATORY_CARE_PROVIDER_SITE_OTHER): Payer: BLUE CROSS/BLUE SHIELD | Admitting: Clinical

## 2015-12-20 DIAGNOSIS — F200 Paranoid schizophrenia: Secondary | ICD-10-CM | POA: Diagnosis not present

## 2015-12-20 DIAGNOSIS — F4310 Post-traumatic stress disorder, unspecified: Secondary | ICD-10-CM

## 2015-12-20 NOTE — Progress Notes (Signed)
   THERAPIST PROGRESS NOTE  Session Time: 4:21 - 5:10   Participation Level: Active  Behavioral Response: DisheveledAlertAnxious and Depressed  Type of Therapy: Individual Therapy  Treatment Goals addressed: Improve Psychiatric Symptoms, Emotional Regulation Skills, Calming Skills, Healthy Coping Skills,   Interventions: Motivational Interviewing, Cognitive Behavior Therapy and Grounding/Mindfulness Skills, psychoeducation  Summary: Danielle [name] is a 45 y.o. female who presents with Schizophrenia and PTSD.   Suicidal/Homicidal: No - without intent/plan  Therapist Response:  Mayvis met with clinician for an individual session. [name]'s husband joined the session to support her. [name] discussed her psychiatric symptoms and , her current life events . Clinician asked open ended questions. [name] shared that she had broncitus. She shared that she was sleeping better, but her fear and hallucinations continue to be bad. [name] shared some about her symptoms and the things she is doing to cope. [name] was bothered by hallucinations in session. Client and clinician practiced a grounding techniques together.  Plan: Return again in 2 weeks.  Diagnosis:     Axis I: Schizophrenia, paranoid type and PTSD    Tabius Rood A, LCSW 12/20/2015

## 2015-12-23 ENCOUNTER — Emergency Department (HOSPITAL_COMMUNITY)
Admission: EM | Admit: 2015-12-23 | Discharge: 2015-12-25 | Disposition: A | Discharge status: Home / Self Care | Payer: BLUE CROSS/BLUE SHIELD | Attending: Emergency Medicine | Admitting: Emergency Medicine

## 2015-12-23 ENCOUNTER — Emergency Department (HOSPITAL_COMMUNITY): Payer: BLUE CROSS/BLUE SHIELD

## 2015-12-23 ENCOUNTER — Encounter (HOSPITAL_COMMUNITY): Payer: Self-pay

## 2015-12-23 DIAGNOSIS — Z7982 Long term (current) use of aspirin: Secondary | ICD-10-CM | POA: Diagnosis not present

## 2015-12-23 DIAGNOSIS — R51 Headache: Secondary | ICD-10-CM | POA: Insufficient documentation

## 2015-12-23 DIAGNOSIS — Z87891 Personal history of nicotine dependence: Secondary | ICD-10-CM | POA: Diagnosis not present

## 2015-12-23 DIAGNOSIS — J40 Bronchitis, not specified as acute or chronic: Secondary | ICD-10-CM | POA: Diagnosis not present

## 2015-12-23 DIAGNOSIS — R45851 Suicidal ideations: Secondary | ICD-10-CM | POA: Diagnosis not present

## 2015-12-23 DIAGNOSIS — Z79899 Other long term (current) drug therapy: Secondary | ICD-10-CM | POA: Insufficient documentation

## 2015-12-23 DIAGNOSIS — Z791 Long term (current) use of non-steroidal anti-inflammatories (NSAID): Secondary | ICD-10-CM | POA: Insufficient documentation

## 2015-12-23 DIAGNOSIS — F200 Paranoid schizophrenia: Secondary | ICD-10-CM | POA: Diagnosis not present

## 2015-12-23 DIAGNOSIS — E039 Hypothyroidism, unspecified: Secondary | ICD-10-CM | POA: Insufficient documentation

## 2015-12-23 DIAGNOSIS — R440 Auditory hallucinations: Secondary | ICD-10-CM | POA: Insufficient documentation

## 2015-12-23 LAB — CBC
HEMATOCRIT: 42.5 % (ref 36.0–46.0)
HEMOGLOBIN: 13.6 g/dL (ref 12.0–15.0)
MCH: 28.2 pg (ref 26.0–34.0)
MCHC: 32 g/dL (ref 30.0–36.0)
MCV: 88 fL (ref 78.0–100.0)
Platelets: 400 10*3/uL (ref 150–400)
RBC: 4.83 MIL/uL (ref 3.87–5.11)
RDW: 15.8 % — ABNORMAL HIGH (ref 11.5–15.5)
WBC: 20.2 10*3/uL — ABNORMAL HIGH (ref 4.0–10.5)

## 2015-12-23 LAB — COMPREHENSIVE METABOLIC PANEL
ALBUMIN: 4.2 g/dL (ref 3.5–5.0)
ALK PHOS: 93 U/L (ref 38–126)
ALT: 12 U/L — AB (ref 14–54)
AST: 19 U/L (ref 15–41)
Anion gap: 9 (ref 5–15)
BUN: 10 mg/dL (ref 6–20)
CALCIUM: 9.5 mg/dL (ref 8.9–10.3)
CO2: 18 mmol/L — AB (ref 22–32)
CREATININE: 0.93 mg/dL (ref 0.44–1.00)
Chloride: 111 mmol/L (ref 101–111)
GFR calc non Af Amer: >60 mL/min (ref >60)
GLUCOSE: 145 mg/dL — AB (ref 65–99)
Potassium: 3.3 mmol/L — ABNORMAL LOW (ref 3.5–5.1)
SODIUM: 138 mmol/L (ref 135–145)
Total Bilirubin: 0.4 mg/dL (ref 0.3–1.2)
Total Protein: 7.6 g/dL (ref 6.5–8.1)

## 2015-12-23 LAB — URINALYSIS, ROUTINE W REFLEX MICROSCOPIC
BILIRUBIN URINE: NEGATIVE
Glucose, UA: NEGATIVE mg/dL
KETONES UR: NEGATIVE mg/dL
NITRITE: NEGATIVE
PROTEIN: NEGATIVE mg/dL
Specific Gravity, Urine: 1.015 (ref 1.005–1.030)
pH: 6 (ref 5.0–8.0)

## 2015-12-23 LAB — URINE MICROSCOPIC-ADD ON
Bacteria, UA: NONE SEEN
RBC / HPF: NONE SEEN RBC/hpf (ref 0–5)

## 2015-12-23 LAB — SALICYLATE LEVEL: Salicylate Lvl: <4 mg/dL (ref 2.8–30.0)

## 2015-12-23 LAB — RAPID URINE DRUG SCREEN, HOSP PERFORMED
Amphetamines: NOT DETECTED
BARBITURATES: NOT DETECTED
Benzodiazepines: POSITIVE — AB
Cocaine: NOT DETECTED
Opiates: NOT DETECTED
TETRAHYDROCANNABINOL: NOT DETECTED

## 2015-12-23 LAB — ACETAMINOPHEN LEVEL: Acetaminophen (Tylenol), Serum: <10 ug/mL — ABNORMAL LOW (ref 10–30)

## 2015-12-23 LAB — HCG, QUANTITATIVE, PREGNANCY: hCG, Beta Chain, Quant, S: <1 m[IU]/mL (ref <5)

## 2015-12-23 LAB — ETHANOL: Alcohol, Ethyl (B): <5 mg/dL (ref <5)

## 2015-12-23 MED ORDER — ARIPIPRAZOLE 10 MG PO TABS
30.0000 mg | ORAL_TABLET | Freq: Every day | ORAL | Status: DC
  Administered 2015-12-23 – 2015-12-25 (×3): 30 mg via ORAL
  Filled 2015-12-23 (×4): qty 3

## 2015-12-23 MED ORDER — BUSPIRONE HCL 10 MG PO TABS
10.0000 mg | ORAL_TABLET | Freq: Two times a day (BID) | ORAL | Status: DC
  Administered 2015-12-23 – 2015-12-25 (×4): 10 mg via ORAL
  Filled 2015-12-23 (×4): qty 1

## 2015-12-23 MED ORDER — ONDANSETRON HCL 4 MG PO TABS: 4.0000 mg | ORAL_TABLET | Freq: Three times a day (TID) | ORAL | Status: DC | PRN

## 2015-12-23 MED ORDER — VITAMIN D (ERGOCALCIFEROL) 1.25 MG (50000 UNIT) PO CAPS: 50000.0000 [IU] | ORAL_CAPSULE | ORAL | Status: DC

## 2015-12-23 MED ORDER — PRAVASTATIN SODIUM 10 MG PO TABS
10.0000 mg | ORAL_TABLET | Freq: Every day | ORAL | Status: DC
  Administered 2015-12-23 – 2015-12-24 (×2): 10 mg via ORAL
  Filled 2015-12-23 (×2): qty 1

## 2015-12-23 MED ORDER — FOLIC ACID 1 MG PO TABS
1.0000 mg | ORAL_TABLET | Freq: Every day | ORAL | Status: DC
  Administered 2015-12-23 – 2015-12-25 (×3): 1 mg via ORAL
  Filled 2015-12-23 (×4): qty 1

## 2015-12-23 MED ORDER — BENZONATATE 100 MG PO CAPS: 100.0000 mg | ORAL_CAPSULE | Freq: Three times a day (TID) | ORAL | Status: DC | PRN

## 2015-12-23 MED ORDER — POTASSIUM CHLORIDE CRYS ER 20 MEQ PO TBCR
20.0000 meq | EXTENDED_RELEASE_TABLET | Freq: Once | ORAL | Status: AC
  Administered 2015-12-23: 20 meq via ORAL
  Filled 2015-12-23: qty 1

## 2015-12-23 MED ORDER — ZOLPIDEM TARTRATE 5 MG PO TABS
5.0000 mg | ORAL_TABLET | Freq: Every evening | ORAL | Status: DC | PRN
  Administered 2015-12-23: 5 mg via ORAL
  Filled 2015-12-23 (×2): qty 1

## 2015-12-23 MED ORDER — ALUM & MAG HYDROXIDE-SIMETH 200-200-20 MG/5ML PO SUSP: 30.0000 mL | ORAL | Status: DC | PRN

## 2015-12-23 MED ORDER — IBUPROFEN 200 MG PO TABS
600.0000 mg | ORAL_TABLET | Freq: Three times a day (TID) | ORAL | Status: DC | PRN
Start: 2015-12-23 — End: 2015-12-23

## 2015-12-23 MED ORDER — PRAZOSIN HCL 1 MG PO CAPS
1.0000 mg | ORAL_CAPSULE | Freq: Every day | ORAL | Status: DC
  Administered 2015-12-23 – 2015-12-24 (×2): 1 mg via ORAL
  Filled 2015-12-23 (×2): qty 1

## 2015-12-23 MED ORDER — GUAIFENESIN ER 600 MG PO TB12
1200.0000 mg | ORAL_TABLET | Freq: Two times a day (BID) | ORAL | Status: DC
  Administered 2015-12-23 – 2015-12-25 (×4): 1200 mg via ORAL
  Filled 2015-12-23 (×4): qty 2

## 2015-12-23 MED ORDER — ASPIRIN EC 81 MG PO TBEC
81.0000 mg | DELAYED_RELEASE_TABLET | Freq: Every day | ORAL | Status: DC
  Administered 2015-12-24 – 2015-12-25 (×2): 81 mg via ORAL
  Filled 2015-12-23 (×2): qty 1

## 2015-12-23 MED ORDER — HYDROXYZINE HCL 25 MG PO TABS
25.0000 mg | ORAL_TABLET | Freq: Every day | ORAL | Status: DC
  Administered 2015-12-23 – 2015-12-24 (×2): 25 mg via ORAL
  Filled 2015-12-23 (×2): qty 1

## 2015-12-23 MED ORDER — LITHIUM CARBONATE 300 MG PO CAPS
300.0000 mg | ORAL_CAPSULE | Freq: Three times a day (TID) | ORAL | Status: DC
  Administered 2015-12-23 – 2015-12-25 (×5): 300 mg via ORAL
  Filled 2015-12-23 (×5): qty 1

## 2015-12-23 MED ORDER — LEVOTHYROXINE SODIUM 75 MCG PO TABS
75.0000 ug | ORAL_TABLET | Freq: Every day | ORAL | Status: DC
  Administered 2015-12-24 – 2015-12-25 (×2): 75 ug via ORAL
  Filled 2015-12-23 (×2): qty 1

## 2015-12-23 MED ORDER — CYCLOBENZAPRINE HCL 10 MG PO TABS
10.0000 mg | ORAL_TABLET | Freq: Two times a day (BID) | ORAL | Status: DC
  Administered 2015-12-23 – 2015-12-25 (×4): 10 mg via ORAL
  Filled 2015-12-23 (×4): qty 1

## 2015-12-23 MED ORDER — DIAZEPAM 5 MG PO TABS
5.0000 mg | ORAL_TABLET | Freq: Every day | ORAL | Status: DC
  Administered 2015-12-23 – 2015-12-24 (×2): 5 mg via ORAL
  Filled 2015-12-23 (×2): qty 1

## 2015-12-23 MED ORDER — PREDNISONE 20 MG PO TABS: 20.0000 mg | ORAL_TABLET | ORAL | Status: DC

## 2015-12-23 MED ORDER — VITAMIN B-12 1000 MCG PO TABS
1000.0000 ug | ORAL_TABLET | Freq: Every day | ORAL | Status: DC
Start: 2015-12-24 — End: 2015-12-25
  Administered 2015-12-24 – 2015-12-25 (×2): 1000 ug via ORAL
  Filled 2015-12-23 (×2): qty 1

## 2015-12-23 MED ORDER — TOPIRAMATE 100 MG PO TABS
200.0000 mg | ORAL_TABLET | Freq: Two times a day (BID) | ORAL | Status: DC
  Administered 2015-12-23 – 2015-12-25 (×4): 200 mg via ORAL
  Filled 2015-12-23 (×4): qty 2

## 2015-12-23 MED ORDER — ACETAMINOPHEN 325 MG PO TABS
650.0000 mg | ORAL_TABLET | ORAL | Status: DC | PRN
  Administered 2015-12-25: 650 mg via ORAL
  Filled 2015-12-23: qty 2

## 2015-12-23 NOTE — ED Notes (Addendum)
PT UNABLE TO REMOVE YELLOW RING FROM LEFT 4TH FINGER. ALL BELONGINGS GIVEN TO THE HUSBAND TO TAKE HOME EXCEPT 2 ADULT DIAPERS.

## 2015-12-23 NOTE — ED Provider Notes (Signed)
[facility]-EMERGENCY DEPT Provider Note   CSN: [id number] Arrival date & time: 12/23/15  1101     History   Chief Complaint Chief Complaint  Patient presents with  . Suicidal    HPI Danielle [name] is a 45 y.o. female.  Danielle [name] is a 46 y.o. female with history of anemia, anxiety, bipolar 1 disorder, hypothyroidism, schizophrenia, SOB presents to ED with complaint of suicidal thoughts, paranoia, and hallucinations. Patient reports she is hearing voices tell her she is "stupid, dumb, and shouldn't live." She typically always hears voices; however, they have progressively worsened. She also endorses seeing "shadow people." She states she has suicidal ideations, but does not have a plan. No access to weapons. She denies homicidal ideations. She is paranoid and scared. She endorses depression, anhedonia, anxiousness, sleep disturbances, decrease concentration. She was hospitalized in February 2017 and per husband when she was discharged she was doing very well. However, over the last several months additional adjustments have been made to her medications leading to increase in hallucinations and suicidal thoughts. Patient denies recreational drug use, alcohol use, or tobacco use. She endorses a headache onset today, left sided, 6/10. She has a history of migraines and states this is the exact same as her usual migraines.       Past Medical History:  Diagnosis Date  . Anemia   . Anxiety   . Bipolar 1 disorder (HCC)   . Depressed   . Headache(784.0)   . Heart murmur   . Hypothyroidism   . PTSD (post-traumatic stress disorder)   . Schizophrenia (HCC)   . Seizures (HCC)   . Shortness of breath     Patient Active Problem List   Diagnosis Date Noted  . Frequent falls 06/18/2015  . Tobacco use disorder 06/08/2015  . Hyperammonemia (HCC) 06/08/2015  . Paranoid schizophrenia (HCC)   . Migraine headache 06/04/2012  . Seizure (HCC) 06/04/2012  . Hypothyroidism 06/04/2012  . OSA  (obstructive sleep apnea) 08/29/2011  . Post traumatic stress disorder (PTSD) 07/31/2011    Past Surgical History:  Procedure Laterality Date  . FOOT SURGERY    . TUBAL LIGATION  1996    OB History    No data available       Home Medications    Prior to Admission medications   Medication Sig Start Date End Date Taking? Authorizing Provider  ARIPiprazole (ABILIFY) 30 MG tablet Take 1 tablet (30 mg total) by mouth daily. 12/08/15  Yes Leata Mouse, MD  aspirin EC 81 MG tablet Take 1 tablet (81 mg total) by mouth daily with breakfast. 06/16/15  Yes Jolanta B Pucilowska, MD  benzonatate (TESSALON) 100 MG capsule Take 100 mg by mouth 3 (three) times daily as needed for cough.   Yes Historical Provider, MD  busPIRone (BUSPAR) 10 MG tablet Take 10 mg by mouth 2 (two) times daily.   Yes Historical Provider, MD  cyanocobalamin 1000 MCG tablet Take 1 tablet (1,000 mcg total) by mouth daily. 06/16/15  Yes Jolanta B Pucilowska, MD  cyclobenzaprine (FLEXERIL) 10 MG tablet Take 10 mg by mouth 2 (two) times daily.   Yes Historical Provider, MD  diazepam (VALIUM) 5 MG tablet Take 1 tablet (5 mg total) by mouth at bedtime. 12/08/15 12/07/16 Yes Leata Mouse, MD  eletriptan (RELPAX) 40 MG tablet Take 40 mg by mouth as needed for migraine or headache. May repeat in 2 hours if headache persists or recurs.   Yes Historical Provider, MD  folic acid (FOLVITE) 1  MG tablet Take 1 tablet (1 mg total) by mouth daily. 06/16/15  Yes Jolanta B Pucilowska, MD  guaiFENesin (MUCINEX) 600 MG 12 hr tablet Take 1,200 mg by mouth 2 (two) times daily.   Yes Historical Provider, MD  hydrOXYzine (ATARAX/VISTARIL) 25 MG tablet Take 1 tablet (25 mg total) by mouth at bedtime. 11/10/15  Yes Cleotis Nipper, MD  levothyroxine (SYNTHROID, LEVOTHROID) 75 MCG tablet Take 1 tablet (75 mcg total) by mouth daily before breakfast. 06/16/15  Yes Jolanta B Pucilowska, MD  lithium carbonate 300 MG capsule Take 1 capsule (300 mg  total) by mouth 3 (three) times daily with meals. 12/08/15  Yes Leata Mouse, MD  lovastatin (MEVACOR) 20 MG tablet Take 20 mg by mouth at bedtime.   Yes Historical Provider, MD  prazosin (MINIPRESS) 1 MG capsule Take 1 capsule (1 mg total) by mouth at bedtime. 12/08/15  Yes Leata Mouse, MD  predniSONE (DELTASONE) 20 MG tablet Take 20 mg by mouth as directed.   Yes Historical Provider, MD  topiramate (TOPAMAX) 100 MG tablet Take 200 mg by mouth 2 (two) times daily.   Yes Historical Provider, MD  Vitamin D, Ergocalciferol, (DRISDOL) 50000 units CAPS capsule Take 50,000 Units by mouth every 7 (seven) days. saturday   Yes Historical Provider, MD  ibuprofen (ADVIL,MOTRIN) 800 MG tablet Take 1 tablet (800 mg total) by mouth every 8 (eight) hours as needed for headache or moderate pain. Patient not taking: Reported on 12/23/2015 06/16/15   Shari Prows, MD  topiramate (TOPAMAX) 200 MG tablet Take 1 tablet (200 mg total) by mouth at bedtime. Patient not taking: Reported on 12/23/2015 06/16/15   Shari Prows, MD    Family History Family History  Problem Relation Age of Onset  . Adopted: Yes    Social History Social History  Substance Use Topics  . Smoking status: Former Smoker    Packs/day: 1.50    Years: 30.00    Types: Cigarettes    Quit date: 09/20/2011  . Smokeless tobacco: Never Used  . Alcohol use No     Allergies   Tomato; Imitrex [sumatriptan base]; and Mustard seed   Review of Systems Review of Systems  Constitutional: Negative for fever.  HENT: Negative for congestion and sore throat.   Eyes: Negative for discharge.  Respiratory: Positive for cough ( recently diagnosed with bronchitis). Negative for shortness of breath.   Cardiovascular: Negative for chest pain.  Gastrointestinal: Negative for abdominal pain, nausea and vomiting.  Genitourinary: Negative for dysuria and hematuria.  Skin: Negative for rash.  Neurological: Positive for  headaches (  h/o migraines, 6/10, left sided ).  Psychiatric/Behavioral: Positive for hallucinations, sleep disturbance and suicidal ideas. The patient is nervous/anxious.      Physical Exam Updated Vital Signs BP 113/77 (BP Location: Left Arm)   Pulse 71   Temp 97.9 F (36.6 C) (Oral)   Resp 20   Ht 5\' 11"  (1.803 m)   Wt 109.8 kg   SpO2 98%   BMI 33.75 kg/m   Physical Exam  Constitutional: She appears well-developed and well-nourished. No distress.  HENT:  Head: Normocephalic and atraumatic.  Mouth/Throat: Oropharynx is clear and moist. No oropharyngeal exudate.  Eyes: Conjunctivae and EOM are normal. Pupils are equal, round, and reactive to light. Right eye exhibits no discharge. Left eye exhibits no discharge. No scleral icterus.  Neck: Normal range of motion. Neck supple.  Cardiovascular: Normal rate, regular rhythm, normal heart sounds and intact distal pulses.  No murmur heard. Pulmonary/Chest: Effort normal and breath sounds normal. No respiratory distress. She has no wheezes. She has no rales.  Abdominal: Soft. Bowel sounds are normal. She exhibits no distension. There is no tenderness. There is no rebound and no guarding.  Musculoskeletal: Normal range of motion. She exhibits edema. She exhibits no tenderness.  Lymphadenopathy:    She has no cervical adenopathy.  Neurological: She is alert. Coordination normal.  Skin: Skin is warm and dry. She is not diaphoretic.  Psychiatric: Her mood appears anxious. She expresses suicidal ideation.  Appears scared and timid     ED Treatments / Results  Labs (all labs ordered are listed, but only abnormal results are displayed) Labs Reviewed  COMPREHENSIVE METABOLIC PANEL - Abnormal; Notable for the following:       Result Value   Potassium 3.3 (*)    CO2 18 (*)    Glucose, Bld 145 (*)    ALT 12 (*)    All other components within normal limits  ACETAMINOPHEN LEVEL - Abnormal; Notable for the following:    Acetaminophen  (Tylenol), Serum <10 (*)    All other components within normal limits  CBC - Abnormal; Notable for the following:    WBC 20.2 (*)    RDW 15.8 (*)    All other components within normal limits  URINE RAPID DRUG SCREEN, HOSP PERFORMED - Abnormal; Notable for the following:    Benzodiazepines POSITIVE (*)    All other components within normal limits  URINALYSIS, ROUTINE W REFLEX MICROSCOPIC (NOT AT [facility]) - Abnormal; Notable for the following:    APPearance CLOUDY (*)    Hgb urine dipstick LARGE (*)    Leukocytes, UA TRACE (*)    All other components within normal limits  URINE MICROSCOPIC-ADD ON - Abnormal; Notable for the following:    Squamous Epithelial / LPF 6-30 (*)    All other components within normal limits  ETHANOL  SALICYLATE LEVEL  HCG, QUANTITATIVE, PREGNANCY    EKG  EKG Interpretation None       Radiology Dg Chest 2 View  Result Date: 12/23/2015 CLINICAL DATA:  Medical clearance.  No chest complaints today. EXAM: CHEST  2 VIEW COMPARISON:  None. FINDINGS: Healed left rib fractures. The heart, hila, mediastinum, lungs, and pleura otherwise demonstrate no acute abnormalities. Mild scar or atelectasis in the lung bases. IMPRESSION: No active cardiopulmonary disease. Electronically Signed   By: Gerome Samavid  Williams III M.D   On: 12/23/2015 17:53    Procedures Procedures (including critical care time)  Medications Ordered in ED Medications  acetaminophen (TYLENOL) tablet 650 mg (not administered)  zolpidem (AMBIEN) tablet 5 mg (not administered)  ondansetron (ZOFRAN) tablet 4 mg (not administered)  alum & mag hydroxide-simeth (MAALOX/MYLANTA) 200-200-20 MG/5ML suspension 30 mL (not administered)  ARIPiprazole (ABILIFY) tablet 30 mg (30 mg Oral Given 12/23/15 2027)  aspirin EC tablet 81 mg (not administered)  benzonatate (TESSALON) capsule 100 mg (not administered)  Vitamin D (Ergocalciferol) (DRISDOL) capsule 50,000 Units (not administered)  topiramate (TOPAMAX) tablet  200 mg (200 mg Oral Given 12/23/15 2025)  predniSONE (DELTASONE) tablet 20 mg (not administered)  prazosin (MINIPRESS) capsule 1 mg (not administered)  pravastatin (PRAVACHOL) tablet 10 mg (not administered)  lithium carbonate capsule 300 mg (300 mg Oral Given 12/23/15 1746)  levothyroxine (SYNTHROID, LEVOTHROID) tablet 75 mcg (not administered)  hydrOXYzine (ATARAX/VISTARIL) tablet 25 mg (25 mg Oral Given 12/23/15 2027)  guaiFENesin (MUCINEX) 12 hr tablet 1,200 mg (not administered)  diazepam (VALIUM) tablet 5  mg (5 mg Oral Given 12/23/15 2025)  cyclobenzaprine (FLEXERIL) tablet 10 mg (10 mg Oral Given 12/23/15 2026)  busPIRone (BUSPAR) tablet 10 mg (10 mg Oral Given 12/23/15 2026)  vitamin B-12 (CYANOCOBALAMIN) tablet 1,000 mcg (not administered)  folic acid (FOLVITE) tablet 1 mg (not administered)  potassium chloride SA (K-DUR,KLOR-CON) CR tablet 20 mEq (20 mEq Oral Given 12/23/15 1746)     Initial Impression / Assessment and Plan / ED Course  I have reviewed the triage vital signs and the nursing notes.  Pertinent labs & imaging results that were available during my care of the patient were reviewed by me and considered in my medical decision making (see chart for details).  Clinical Course  Value Comment By Time  WBC: (!) 20.2 Will check CXR and urine Lona KettleAshley Laurel Meyer, PA-C 08/19 1752  DG Chest 2 View Normal cardiac silhouette. No evidence of consolidation, effusion, or PTX.  Lona Kettleshley Laurel Meyer, [place] 08/19 1816    Patient is afebrile and non-toxic appearing. Vital signs are stable. Physical exam re-assuring. Will check basic labs. TTS consult regarding disposition.   Etoh, acetaminophen, salicylate nml. hcg negative. UDS positive for BZDs. CMP remarkable for hyperglycemia without evidence of DKA. Mildly low potassium - PO potassium given.CBC remarkable for leukocytosis. Will check CXR and UA.   CXR negative for acute cardiopulmonary process. U/A negative for UTI. Unclear source of  elevated WBC. Patient is medically cleared.     Final Clinical Impressions(s) / ED Diagnoses   Final diagnoses:  Suicidal thoughts    New Prescriptions New Prescriptions   No medications on file     Lona Kettleshley Laurel Meyer, PA-C 12/23/15 2045    Charlynne Panderavid Hsienta Yao, MD 12/24/15 [phone]

## 2015-12-23 NOTE — ED Triage Notes (Signed)
PT C/O FEELING SUICIDAL W/O A PLAN. PT STS SHE HAS BEEN HEARING VOICES "TELLING HER THAT SHE IS STUPID, DUMB, AND SHE IS NOT WORTH OF LIVING." PER  HER HUSBAND, "SINCE HER PSYCH DOCTOR HAS BEEN ADJUSTING HER MEDICATIONS, SHE HAS BEEN GETTING WORSE FOR MONTHS." DENIES HI.
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
## 2015-12-24 DIAGNOSIS — F200 Paranoid schizophrenia: Secondary | ICD-10-CM | POA: Diagnosis not present
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
## 2015-12-24 NOTE — Progress Notes (Signed)
Pt reports she does not use her CPAP at night at home due to it making her feel claustrophobic.  Pt resting comfortably.  Sleeping periodically.  No snoring present.  Will continue to monitor.  Alert and oriented x4.  Barrie LymeVance, Jamisen Hawes E RN 9:34 PM 12/24/2015

## 2015-12-24 NOTE — Progress Notes (Signed)
Disposition CSW completed patient referrals for the following inpatient psych facilities:  [facility]  CSW will continue to follow patient for placement needs.  Seward SpeckLeo Jevante Hollibaugh [facility] SpringfieldCSW,LCAS Behavioral Health Disposition CSW [phone]

## 2015-12-24 NOTE — BH Assessment (Signed)
[facility].

## 2015-12-25 ENCOUNTER — Encounter (HOSPITAL_COMMUNITY): Payer: Self-pay | Admitting: Clinical

## 2015-12-25 ENCOUNTER — Ambulatory Visit (HOSPITAL_COMMUNITY)
Admission: RE | Admit: 2015-12-25 | Discharge: 2015-12-25 | Disposition: A | Discharge status: Home / Self Care | Payer: BLUE CROSS/BLUE SHIELD | Source: Home / Self Care | Attending: Psychiatry | Admitting: Psychiatry

## 2015-12-25 DIAGNOSIS — F329 Major depressive disorder, single episode, unspecified: Secondary | ICD-10-CM

## 2015-12-25 DIAGNOSIS — E039 Hypothyroidism, unspecified: Secondary | ICD-10-CM | POA: Insufficient documentation

## 2015-12-25 DIAGNOSIS — R0602 Shortness of breath: Secondary | ICD-10-CM

## 2015-12-25 DIAGNOSIS — R569 Unspecified convulsions: Secondary | ICD-10-CM

## 2015-12-25 DIAGNOSIS — R51 Headache: Secondary | ICD-10-CM

## 2015-12-25 DIAGNOSIS — D649 Anemia, unspecified: Secondary | ICD-10-CM | POA: Insufficient documentation

## 2015-12-25 DIAGNOSIS — R011 Cardiac murmur, unspecified: Secondary | ICD-10-CM

## 2015-12-25 DIAGNOSIS — F4310 Post-traumatic stress disorder, unspecified: Secondary | ICD-10-CM | POA: Insufficient documentation

## 2015-12-25 DIAGNOSIS — F200 Paranoid schizophrenia: Secondary | ICD-10-CM | POA: Diagnosis not present

## 2015-12-25 DIAGNOSIS — F319 Bipolar disorder, unspecified: Secondary | ICD-10-CM | POA: Insufficient documentation

## 2015-12-25 DIAGNOSIS — F419 Anxiety disorder, unspecified: Secondary | ICD-10-CM | POA: Insufficient documentation

## 2015-12-25 DIAGNOSIS — F209 Schizophrenia, unspecified: Secondary | ICD-10-CM | POA: Insufficient documentation

## 2015-12-25 NOTE — [facility] Suicide Risk Assessment (Addendum)
Suicide Risk Assessment  Discharge Assessment   [facility]   Principal Problem: Paranoid schizophrenia [facility](HCC) Discharge Diagnoses:  Patient Active Problem List   Diagnosis Date Noted  . Paranoid schizophrenia ([facility]) [F20.0]     Priority: High  . Frequent falls [R29.6] 06/18/2015  . Tobacco use disorder [F17.200] 06/08/2015  . Hyperammonemia ([facility]) [E72.20] 06/08/2015  . Migraine headache [G43.909] 06/04/2012  . Seizure ([facility]) [R56.9] 06/04/2012  . Hypothyroidism [E03.9] 06/04/2012  . OSA (obstructive sleep apnea) [G47.33] 08/29/2011  . Post traumatic stress disorder (PTSD) [F43.10] 07/31/2011    Total Time spent with patient: 45 minutes  Musculoskeletal: Strength & Muscle Tone: within normal limits Gait & Station: normal Patient leans: N/A  Psychiatric Specialty Exam: Physical Exam  Constitutional: She is oriented to person, place, and time. She appears well-developed and well-nourished.  HENT:  Head: Normocephalic.  Neck: Normal range of motion.  Respiratory: Effort normal.  Musculoskeletal: Normal range of motion.  Neurological: She is alert and oriented to person, place, and time.  Skin: Skin is warm and dry.  Psychiatric: Her speech is normal and behavior is normal. Judgment and thought content normal. Cognition and memory are normal. She exhibits a depressed mood.    Review of Systems  Constitutional: Negative.   HENT: Negative.   Eyes: Negative.   Respiratory: Negative.   Cardiovascular: Negative.   Gastrointestinal: Negative.   Genitourinary: Negative.   Musculoskeletal: Negative.   Skin: Negative.   Neurological: Negative.   Endo/Heme/Allergies: Negative.   Psychiatric/Behavioral: Positive for depression.    Blood pressure 102/73, pulse 64, temperature 97.5 F (36.4 C), temperature source Oral, resp. rate 18, height 5\' 11"  (1.803 m), weight 109.8 kg (242 lb), SpO2 97 %.Body mass index is 33.75 kg/m.  General Appearance: Casual   Eye Contact:  Good  Speech:  Normal Rate  Volume:  Normal  Mood:  Depressed  Affect:  Congruent  Thought Process:  Coherent and Descriptions of Associations: Intact  Orientation:  Full (Time, Place, and Person)  Thought Content:  WDL  Suicidal Thoughts:  No  Homicidal Thoughts:  No  Memory:  Immediate;   Good Recent;   Good Remote;   Good  Judgement:  Fair  Insight:  Fair  Psychomotor Activity:  Normal  Concentration:  Concentration: Good and Attention Span: Good  Recall:  Good  Fund of Knowledge:  Good  Language:  Good  Akathisia:  No  Handed:  Right  AIMS (if indicated):     Assets:  Housing Intimacy Leisure Time Physical Health Resilience Social Support  ADL's:  Intact  Cognition:  WNL  Sleep:       Mental Status Per Nursing Assessment::   On Admission:   suicidal ideations  Demographic Factors:  Caucasian  Loss Factors: NA  Historical Factors: NA  Risk Reduction Factors:   Responsible for children under 47 years of age, Sense of responsibility to family, Living with another person, especially a relative, Positive social support and Positive therapeutic relationship  Continued Clinical Symptoms:  Depression, moderate to mild  Cognitive Features That Contribute To Risk:  None    Suicide Risk:  Minimal: No identifiable suicidal ideation.  Patients presenting with no risk factors but with morbid ruminations; may be classified as minimal risk based on the severity of the depressive symptoms    Plan Of Care/Follow-up recommendations:  Activity:  as tolerated Diet:  heart healthy diet  LORD, JAMISON, NP 12/25/2015, 11:53 AM

## 2015-12-25 NOTE — Discharge Instructions (Addendum)
For your ongoing behavioral health needs, you are advised to [facility]  Your next appointment is scheduled for Friday, 01/12/2016 at 10:15 am:       [facility]      [address]      [place], [place] [address]      [phone]  Another option to consider is the [facility] Hospital[facility] at [facility] [place].  This program meets several hours a day, several days a week.  Call them to ask about the program or to enroll in the program:       [facility]      [address]      [place], [place] [address]      [phone]
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
## 2015-12-25 NOTE — ED Notes (Signed)
Patient requested medication for headache after getting up going to the bathroom.

## 2015-12-25 NOTE — BH Assessment (Addendum)
[facility] Assessment Progress Note  Per Thedore MinsMojeed Akintayo, MD, this pt does not require psychiatric hospitalization at this time.  [facility]  This Clinical research associatewriter spoke to pt to ask if she is interested in the Mental Health [facility]patient Program [facility], and she has declined this option.  She is [facility]able [facility] where she sees Kathryne SharperSyed Arfeen, MD, moved to a sooner date, however, [facility] reports that [facility] are no sooner appointments.  Her next scheduled appointment is set for Friday, 01/12/2016 at 10:15.  This has been included in pt's discharge instructions.  Pt's nurse, Sue Lushndrea, has been notified.  Doylene Canninghomas Safina Huard, MA Triage Specialist [phone]   Addendum:  Pt's husband presents at the ED and asks to speak to this writer.  Pt has signed Consent to Release Information to him.  I report to him that pt does not present a life threatening danger to herself or others, and therefore does not meet criteria for hospitalization.  He reports that pt was stabilized following a recent admission to [facility], but that due to weight gain, Dr Lolly MustacheArfeen had to change the medications and she has not been a stable since.  I have offered to include information about the Partial Hospitalization Program at [facility] for them to consider as a higher level option for medication management.  This information has been included in pt's discharge instructions.  Sue Lushndrea has once again been notified.  Doylene Canninghomas Roshard Rezabek, MA Triage Specialist [phone]

## 2015-12-25 NOTE — Consult Note (Signed)
[facility] Consult   Reason for Consult:  Suicidal ideations Referring Physician:  EDP Patient Identification: Danielle [name] MRN:  [id number] Principal Diagnosis: Paranoid schizophrenia [facility]) Diagnosis:   Patient Active Problem List   Diagnosis Date Noted  . Paranoid schizophrenia ([place]) [F20.0]     Priority: High  . Frequent falls [R29.6] 06/18/2015  . Tobacco use disorder [F17.200] 06/08/2015  . Hyperammonemia ([place]) [E72.20] 06/08/2015  . Migraine headache [G43.909] 06/04/2012  . Seizure ([place]) [R56.9] 06/04/2012  . Hypothyroidism [E03.9] 06/04/2012  . OSA (obstructive sleep apnea) [G47.33] 08/29/2011  . Post traumatic stress disorder (PTSD) [F43.10] 07/31/2011    Total Time spent with patient: 45 minutes  Subjective:   Danielle [name] is a 45 y.o. female patient does not warrant admission.  HPI:  45 yo female who has been in the ED since Friday with suicidal ideations.  She is well known to the facility and providers as she is here about 4 times a month.  Patient of Dr. Adele Schilder [place] but has an appointment with him on 9/3.  She lives with her husband and child.  Prior to entering her room today, she was calmly watching television with her legs crossed.  When I said she looked much better, she reported having panic attacks but none observed.  History of personality disorder.  Patient has stabilized for discharge.  No suicidal/homicidal ideations, hallucinations, and alcohol/drug abuse.    Past Psychiatric History: schizophrenia  Risk to Self: Suicidal Ideation: Yes-Currently Present Suicidal Intent: Yes-Currently Present Is patient at risk for suicide?: Yes Suicidal Plan?: No Access to Means: No What has been your use of drugs/alcohol within the last 12 months?: NA How many times?: 4 Other Self Harm Risks: tried cut wrists Triggers for Past Attempts: None known Intentional Self Injurious Behavior: None Risk to Others: Homicidal Ideation:  No Thoughts of Harm to Others: No Current Homicidal Intent: No Current Homicidal Plan: No Access to Homicidal Means: No Identified Victim: NA History of harm to others?: No Assessment of Violence: None Noted Violent Behavior Description: NA Does patient have access to weapons?: No Criminal Charges Pending?: No Does patient have a court date: No Prior Inpatient Therapy: Prior Inpatient Therapy: Yes Prior Therapy Dates: 2017 Prior Therapy Facilty/Provider(s): [facility] Reason for Treatment: schizophrenia Prior Outpatient Therapy: Prior Outpatient Therapy: No Prior Therapy Dates: NA Prior Therapy Facilty/Provider(s): NA Reason for Treatment: nA Does patient have an ACCT team?: No Does patient have Intensive In-House Services?  : No Does patient have [facility] services? : No Does patient have P4CC services?: No  Past Medical History:  Past Medical History:  Diagnosis Date  . Anemia   . Anxiety   . Bipolar 1 disorder ([place])   . Depressed   . Headache(784.0)   . Heart murmur   . Hypothyroidism   . PTSD (post-traumatic stress disorder)   . Schizophrenia ([place])   . Seizures ([place])   . Shortness of breath     Past Surgical History:  Procedure Laterality Date  . FOOT SURGERY    . TUBAL LIGATION  1996   Family History:  Family History  Problem Relation Age of Onset  . Adopted: Yes   Family Psychiatric  History: none Social History:  History  Alcohol Use No     History  Drug Use No    Social History   Social History  . Marital status: Married    Spouse name: N/A  . Number of children: 2  .  Years of education: N/A   Occupational History  . unemployed Not Employed   Social History Main Topics  . Smoking status: Former Smoker    Packs/day: 1.50    Years: 30.00    Types: Cigarettes    Quit date: 09/20/2011  . Smokeless tobacco: Never Used  . Alcohol use No  . Drug use: No  . Sexual activity: Yes    Birth control/ protection: Surgical   Other Topics Concern  .  None   Social History Narrative   Pt is adopted. Unsure of family history.   Additional Social History:    Allergies:   Allergies  Allergen Reactions  . Tomato Rash    Pt reports that she breaks out in a rash if she eats tomato based foods but can eat a whole tomato  . Imitrex [Sumatriptan Base] Hives  . Mustard Seed Rash    Also allergic to [employer]:  Results for orders placed or performed during the hospital encounter of 12/23/15 (from the past 48 hour(s))  Rapid urine drug screen (hospital performed)     Status: Abnormal   Collection Time: 12/23/15 11:26 AM  Result Value Ref Range   Opiates NONE DETECTED NONE DETECTED   Cocaine NONE DETECTED NONE DETECTED   Benzodiazepines POSITIVE (A) NONE DETECTED   Amphetamines NONE DETECTED NONE DETECTED   Tetrahydrocannabinol NONE DETECTED NONE DETECTED   Barbiturates NONE DETECTED NONE DETECTED    Comment:        DRUG SCREEN FOR MEDICAL PURPOSES ONLY.  IF CONFIRMATION IS NEEDED FOR ANY PURPOSE, NOTIFY LAB WITHIN 5 DAYS.        LOWEST DETECTABLE LIMITS FOR URINE DRUG SCREEN Drug Class       Cutoff (ng/mL) Amphetamine      1000 Barbiturate      200 Benzodiazepine   009 Tricyclics       381 Opiates          300 Cocaine          300 THC              50   Urinalysis, Routine w reflex microscopic     Status: Abnormal   Collection Time: 12/23/15 11:26 AM  Result Value Ref Range   Color, Urine YELLOW YELLOW   APPearance CLOUDY (A) CLEAR   Specific Gravity, Urine 1.015 1.005 - 1.030   pH 6.0 5.0 - 8.0   Glucose, UA NEGATIVE NEGATIVE mg/dL   Hgb urine dipstick LARGE (A) NEGATIVE   Bilirubin Urine NEGATIVE NEGATIVE   Ketones, ur NEGATIVE NEGATIVE mg/dL   Protein, ur NEGATIVE NEGATIVE mg/dL   Nitrite NEGATIVE NEGATIVE   Leukocytes, UA TRACE (A) NEGATIVE  Urine microscopic-add on     Status: Abnormal   Collection Time: 12/23/15 11:26 AM  Result Value Ref Range   Squamous Epithelial / LPF 6-30 (A) NONE SEEN   WBC, UA 0-5  0 - 5 WBC/hpf   RBC / HPF NONE SEEN 0 - 5 RBC/hpf   Bacteria, UA NONE SEEN NONE SEEN  Comprehensive metabolic panel     Status: Abnormal   Collection Time: 12/23/15 12:06 PM  Result Value Ref Range   Sodium 138 135 - 145 mmol/L   Potassium 3.3 (L) 3.5 - 5.1 mmol/L   Chloride 111 101 - 111 mmol/L   CO2 18 (L) 22 - 32 mmol/L   Glucose, Bld 145 (H) 65 - 99 mg/dL   BUN 10 6 - 20 mg/dL  Creatinine, Ser 0.93 0.44 - 1.00 mg/dL   Calcium 9.5 8.9 - 10.3 mg/dL   Total Protein 7.6 6.5 - 8.1 g/dL   Albumin 4.2 3.5 - 5.0 g/dL   AST 19 15 - 41 U/L   ALT 12 (L) 14 - 54 U/L   Alkaline Phosphatase 93 38 - 126 U/L   Total Bilirubin 0.4 0.3 - 1.2 mg/dL   GFR calc non Af Amer >60 >60 mL/min   GFR calc Af Amer >60 >60 mL/min    Comment: (NOTE) The eGFR has been calculated using the CKD EPI equation. This calculation has not been validated in all clinical situations. eGFR's persistently <60 mL/min signify possible Chronic Kidney Disease.    Anion gap 9 5 - 15  Ethanol     Status: None   Collection Time: 12/23/15 12:06 PM  Result Value Ref Range   Alcohol, Ethyl (B) <5 <5 mg/dL    Comment:        LOWEST DETECTABLE LIMIT FOR SERUM ALCOHOL IS 5 mg/dL FOR MEDICAL PURPOSES ONLY   Salicylate level     Status: None   Collection Time: 12/23/15 12:06 PM  Result Value Ref Range   Salicylate Lvl <8.4 2.8 - 30.0 mg/dL  Acetaminophen level     Status: Abnormal   Collection Time: 12/23/15 12:06 PM  Result Value Ref Range   Acetaminophen (Tylenol), Serum <10 (L) 10 - 30 ug/mL    Comment:        THERAPEUTIC CONCENTRATIONS VARY SIGNIFICANTLY. A RANGE OF 10-30 ug/mL MAY BE AN EFFECTIVE CONCENTRATION FOR MANY PATIENTS. HOWEVER, SOME ARE BEST TREATED AT CONCENTRATIONS OUTSIDE THIS RANGE. ACETAMINOPHEN CONCENTRATIONS >150 ug/mL AT 4 HOURS AFTER INGESTION AND >50 ug/mL AT 12 HOURS AFTER INGESTION ARE OFTEN ASSOCIATED WITH TOXIC REACTIONS.   cbc     Status: Abnormal   Collection Time: 12/23/15  12:06 PM  Result Value Ref Range   WBC 20.2 (H) 4.0 - 10.5 K/uL   RBC 4.83 3.87 - 5.11 MIL/uL   Hemoglobin 13.6 12.0 - 15.0 g/dL   HCT 42.5 36.0 - 46.0 %   MCV 88.0 78.0 - 100.0 fL   MCH 28.2 26.0 - 34.0 pg   MCHC 32.0 30.0 - 36.0 g/dL   RDW 15.8 (H) 11.5 - 15.5 %   Platelets 400 150 - 400 K/uL  hCG, quantitative, pregnancy     Status: None   Collection Time: 12/23/15 12:06 PM  Result Value Ref Range   hCG, Beta Chain, Quant, S <1 <5 mIU/mL    Comment:          GEST. AGE      CONC.  (mIU/mL)   <=1 WEEK        5 - 50     2 WEEKS       50 - 500     3 WEEKS       100 - 10,000     4 WEEKS     1,000 - 30,000     5 WEEKS     3,500 - 115,000   6-8 WEEKS     12,000 - 270,000    12 WEEKS     15,000 - 220,000        FEMALE AND NON-PREGNANT FEMALE:     LESS THAN 5 mIU/mL     Current Facility-Administered Medications  Medication Dose Route Frequency Provider Last Rate Last Dose  . acetaminophen (TYLENOL) tablet 650 mg  650 mg Oral Q4H PRN Macario Golds  Meyer, PA-C   650 mg at 12/25/15 0359  . alum & mag hydroxide-simeth (MAALOX/MYLANTA) 200-200-20 MG/5ML suspension 30 mL  30 mL Oral PRN Roxanna Mew, PA-C      . ARIPiprazole (ABILIFY) tablet 30 mg  30 mg Oral Daily Roxanna Mew, PA-C   30 mg at 12/25/15 1020  . aspirin EC tablet 81 mg  81 mg Oral Q breakfast Roxanna Mew, PA-C   81 mg at 12/25/15 0746  . benzonatate (TESSALON) capsule 100 mg  100 mg Oral TID PRN Roxanna Mew, PA-C      . busPIRone (BUSPAR) tablet 10 mg  10 mg Oral TID Ambrose Finland, MD      . busPIRone (BUSPAR) tablet 10 mg  10 mg Oral BID Roxanna Mew, PA-C   10 mg at 12/25/15 1020  . cyclobenzaprine (FLEXERIL) tablet 10 mg  10 mg Oral BID Roxanna Mew, PA-C   10 mg at 12/25/15 1020  . folic acid (FOLVITE) tablet 1 mg  1 mg Oral Daily Roxanna Mew, PA-C   1 mg at 12/25/15 1020  . guaiFENesin (MUCINEX) 12 hr tablet 1,200 mg  1,200 mg Oral BID Roxanna Mew,  PA-C   1,200 mg at 12/25/15 1020  . hydrOXYzine (ATARAX/VISTARIL) tablet 25 mg  25 mg Oral QHS Roxanna Mew, PA-C   25 mg at 12/24/15 2142  . levothyroxine (SYNTHROID, LEVOTHROID) tablet 75 mcg  75 mcg Oral QAC breakfast Roxanna Mew, PA-C   75 mcg at 12/25/15 0746  . lithium carbonate capsule 300 mg  300 mg Oral TID WC Roxanna Mew, PA-C   300 mg at 12/25/15 0746  . ondansetron (ZOFRAN) tablet 4 mg  4 mg Oral Q8H PRN Roxanna Mew, PA-C      . pravastatin (PRAVACHOL) tablet 10 mg  10 mg Oral q1800 [facility], PA-C   10 mg at 12/24/15 1818  . prazosin (MINIPRESS) capsule 1 mg  1 mg Oral QHS Roxanna Mew, PA-C   1 mg at 12/24/15 2143  . predniSONE (DELTASONE) tablet 20 mg  20 mg Oral UD Roxanna Mew, PA-C      . topiramate (TOPAMAX) tablet 200 mg  200 mg Oral BID Roxanna Mew, PA-C   200 mg at 12/25/15 1020  . vitamin B-12 (CYANOCOBALAMIN) tablet 1,000 mcg  1,000 mcg Oral Daily Roxanna Mew, PA-C   1,000 mcg at 12/25/15 1020  . [START ON 12/30/2015] Vitamin D (Ergocalciferol) (DRISDOL) capsule 50,000 Units  50,000 Units Oral Q7 days Roxanna Mew, PA-C      . zolpidem [facility]) tablet 5 mg  5 mg Oral QHS PRN Roxanna Mew, PA-C   5 mg at 12/23/15 2305   Current Outpatient Prescriptions  Medication Sig Dispense Refill  . ARIPiprazole (ABILIFY) 30 MG tablet Take 1 tablet (30 mg total) by mouth daily. 90 tablet 0  . aspirin EC 81 MG tablet Take 1 tablet (81 mg total) by mouth daily with breakfast. 30 tablet 0  . benzonatate (TESSALON) 100 MG capsule Take 100 mg by mouth 3 (three) times daily as needed for cough.    . busPIRone (BUSPAR) 10 MG tablet Take 10 mg by mouth 2 (two) times daily.    . cyanocobalamin 1000 MCG tablet Take 1 tablet (1,000 mcg total) by mouth daily. 30 tablet 0  . cyclobenzaprine (FLEXERIL) 10 MG tablet Take 10 mg by mouth 2 (two) times daily.    Marland Kitchen  diazepam (VALIUM) 5 MG tablet Take 1 tablet (5 mg total)  by mouth at bedtime. 60 tablet 0  . eletriptan (RELPAX) 40 MG tablet Take 40 mg by mouth as needed for migraine or headache. May repeat in 2 hours if headache persists or recurs.    . folic acid (FOLVITE) 1 MG tablet Take 1 tablet (1 mg total) by mouth daily. 30 tablet 0  . guaiFENesin (MUCINEX) 600 MG 12 hr tablet Take 1,200 mg by mouth 2 (two) times daily.    . hydrOXYzine (ATARAX/VISTARIL) 25 MG tablet Take 1 tablet (25 mg total) by mouth at bedtime. 90 tablet 0  . levothyroxine (SYNTHROID, LEVOTHROID) 75 MCG tablet Take 1 tablet (75 mcg total) by mouth daily before breakfast. 30 tablet 0  . lithium carbonate 300 MG capsule Take 1 capsule (300 mg total) by mouth 3 (three) times daily with meals. 270 capsule 0  . lovastatin (MEVACOR) 20 MG tablet Take 20 mg by mouth at bedtime.    . prazosin (MINIPRESS) 1 MG capsule Take 1 capsule (1 mg total) by mouth at bedtime. 30 capsule 1  . predniSONE (DELTASONE) 20 MG tablet Take 20 mg by mouth as directed.    . topiramate (TOPAMAX) 100 MG tablet Take 200 mg by mouth 2 (two) times daily.    . Vitamin D, Ergocalciferol, (DRISDOL) 50000 units CAPS capsule Take 50,000 Units by mouth every 7 (seven) days. saturday    . ibuprofen (ADVIL,MOTRIN) 800 MG tablet Take 1 tablet (800 mg total) by mouth every 8 (eight) hours as needed for headache or moderate pain. (Patient not taking: Reported on 12/23/2015) 30 tablet 0  . topiramate (TOPAMAX) 200 MG tablet Take 1 tablet (200 mg total) by mouth at bedtime. (Patient not taking: Reported on 12/23/2015) 30 tablet 0    Musculoskeletal: Strength & Muscle Tone: within normal limits Gait & Station: normal Patient leans: N/A  Psychiatric Specialty Exam: Physical Exam  Constitutional: She is oriented to person, place, and time. She appears well-developed and well-nourished.  HENT:  Head: Normocephalic.  Neck: Normal range of motion.  Respiratory: Effort normal.  Musculoskeletal: Normal range of motion.  Neurological:  She is alert and oriented to person, place, and time.  Skin: Skin is warm and dry.  Psychiatric: Her speech is normal and behavior is normal. Judgment and thought content normal. Cognition and memory are normal. She exhibits a depressed mood.    Review of Systems  Constitutional: Negative.   HENT: Negative.   Eyes: Negative.   Respiratory: Negative.   Cardiovascular: Negative.   Gastrointestinal: Negative.   Genitourinary: Negative.   Musculoskeletal: Negative.   Skin: Negative.   Neurological: Negative.   Endo/Heme/Allergies: Negative.   Psychiatric/Behavioral: Positive for depression.    Blood pressure 102/73, pulse 64, temperature 97.5 F (36.4 C), temperature source Oral, resp. rate 18, height _0  (1.803 m), weight 109.8 kg (242 lb), SpO2 97 %.Body mass index is 33.75 kg/m.  General Appearance: Casual  Eye Contact:  Good  Speech:  Normal Rate  Volume:  Normal  Mood:  Depressed  Affect:  Congruent  Thought Process:  Coherent and Descriptions of Associations: Intact  Orientation:  Full (Time, Place, and Person)  Thought Content:  WDL  Suicidal Thoughts:  No  Homicidal Thoughts:  No  Memory:  Immediate;   Good Recent;   Good Remote;   Good  Judgement:  Fair  Insight:  Fair  Psychomotor Activity:  Normal  Concentration:  Concentration: Good and Attention Span:  Good  Recall:  Good  Fund of Knowledge:  Good  Language:  Good  Akathisia:  No  Handed:  Right  AIMS (if indicated):     Assets:  Housing Intimacy Leisure Time Physical Health Resilience Social Support  ADL's:  Intact  Cognition:  WNL  Sleep:        Treatment Plan Summary: Daily contact with patient to assess and evaluate symptoms and progress in treatment, Medication management and Plan schizophrenia, paranoid:  -Crisis stabilization -Medication management:  Continue medical medications along with Abilify 30 mg daily for depression, Buspar 10 mg TID for anxiety, Vistaril 25 mg at bedtime for  anxiety, Prazoin 1 mg at bedtime for nightmares, Lithium 300 mg TID for mood stabilization, and Ambien 5 mg for sleep. -Individual counseling  Disposition: No evidence of imminent risk to self or others at present.    Waylan Boga, NP 12/25/2015 10:43 AM  Patient seen face-to-face for psychiatric evaluation, chart reviewed and case discussed with the physician extender and developed treatment plan. Reviewed the information documented and agree with the treatment plan. Corena Pilgrim, MD

## 2015-12-28 ENCOUNTER — Ambulatory Visit (INDEPENDENT_AMBULATORY_CARE_PROVIDER_SITE_OTHER): Payer: BLUE CROSS/BLUE SHIELD | Admitting: Psychiatry

## 2015-12-28 ENCOUNTER — Encounter (HOSPITAL_COMMUNITY): Payer: Self-pay | Admitting: Psychiatry

## 2015-12-28 VITALS — BP 122/70 | HR 88 | Ht 71.0 in | Wt 238.2 lb

## 2015-12-28 DIAGNOSIS — F200 Paranoid schizophrenia: Secondary | ICD-10-CM

## 2015-12-28 MED ORDER — BUSPIRONE HCL 10 MG PO TABS
10.0000 mg | ORAL_TABLET | Freq: Two times a day (BID) | ORAL | 0 refills | Status: DC
Start: 2015-12-28 — End: 2016-01-29

## 2015-12-28 MED ORDER — DIAZEPAM 5 MG PO TABS: 5.0000 mg | ORAL_TABLET | Freq: Three times a day (TID) | ORAL | 0 refills | Status: DC | PRN

## 2015-12-28 NOTE — Progress Notes (Signed)
BH MD/PA/NP OP Progress Note  12/28/2015 4:16 PM Danielle [name]  MRN:  [id number]  Chief Complaint: not doing well Subjective:  Says voices are worse but anxiety and sleep bother her more HPI: Danielle [name] was accompanied by her husband. initially she was too anxious to talk but calmed down as the interview continued.  She said it was not the voices or the paranoia that scared her it was just a feeling of being afraid for no reason.  The voices are worse and they tell her she is bad and stupid and should die.  They annoy her but the anxiety feels worse to her.  She can get to sleep with the help af the valium but then awakes about 3 hours later and cannot get back to sleep.  Overall the valium works better than the clonazepam she says and her husband agrees.  He says she seems to have panic attacks. Visit Diagnosis: No diagnosis found.  Past Psychiatric History: no changes  Past Medical History:  Past Medical History:  Diagnosis Date  . Anemia   . Anxiety   . Bipolar 1 disorder ([facility])   . Depressed   . Headache(784.0)   . Heart murmur   . Hypothyroidism   . PTSD (post-traumatic stress disorder)   . Schizophrenia ([facility])   . Seizures ([facility])   . Shortness of breath     Past Surgical History:  Procedure Laterality Date  . FOOT SURGERY    . TUBAL LIGATION  1996    Family Psychiatric History: no changes  Family History:  Family History  Problem Relation Age of Onset  . Adopted: Yes    Social History:  Social History   Social History  . Marital status: Married    Spouse name: N/A  . Number of children: 2  . Years of education: N/A   Occupational History  . unemployed Not Employed   Social History Main Topics  . Smoking status: Former Smoker    Packs/day: 1.50    Years: 30.00    Types: Cigarettes    Quit date: 09/20/2011  . Smokeless tobacco: Never Used  . Alcohol use No  . Drug use: No  . Sexual activity: Yes    Birth control/ protection: Surgical   Other Topics  Concern  . None   Social History Narrative   Pt is adopted. Unsure of family history.    Allergies:  Allergies  Allergen Reactions  . Tomato Rash    Pt reports that she breaks out in a rash if she eats tomato based foods but can eat a whole tomato  . Imitrex [Sumatriptan Base] Hives  . Mustard Seed Rash    Also allergic to [facility]    Metabolic Disorder Labs: Lab Results  Component Value Date   HGBA1C 5.5 06/08/2015   MPG 123 (H) 02/05/2012   MPG 114 09/22/2011   Lab Results  Component Value Date   PROLACTIN 39.1 (H) 06/08/2015   Lab Results  Component Value Date   CHOL 218 (H) 06/08/2015   TRIG 215 (H) 06/08/2015   HDL 36 (L) 06/08/2015   CHOLHDL 6.1 06/08/2015   VLDL 43 (H) 06/08/2015   LDLCALC 139 (H) 06/08/2015   LDLCALC UNABLE TO CALCULATE IF TRIGLYCERIDE OVER 400 mg/dL 78/29/562105/19/2013     Current Medications: Current Outpatient Prescriptions  Medication Sig Dispense Refill  . ARIPiprazole (ABILIFY) 30 MG tablet Take 1 tablet (30 mg total) by mouth daily. 90 tablet 0  . aspirin EC 81  MG tablet Take 1 tablet (81 mg total) by mouth daily with breakfast. 30 tablet 0  . benzonatate (TESSALON) 100 MG capsule Take 100 mg by mouth 3 (three) times daily as needed for cough.    . busPIRone (BUSPAR) 10 MG tablet Take 1 tablet (10 mg total) by mouth 2 (two) times daily. 60 tablet 0  . cyanocobalamin 1000 MCG tablet Take 1 tablet (1,000 mcg total) by mouth daily. 30 tablet 0  . cyclobenzaprine (FLEXERIL) 10 MG tablet Take 10 mg by mouth 2 (two) times daily.    . diazepam (VALIUM) 5 MG tablet Take 1 tablet (5 mg total) by mouth every 8 (eight) hours as needed for anxiety. 60 tablet 0  . eletriptan (RELPAX) 40 MG tablet Take 40 mg by mouth as needed for migraine or headache. May repeat in 2 hours if headache persists or recurs.    . folic acid (FOLVITE) 1 MG tablet Take 1 tablet (1 mg total) by mouth daily. 30 tablet 0  . guaiFENesin (MUCINEX) 600 MG 12 hr tablet Take 1,200 mg by  mouth 2 (two) times daily.    . hydrOXYzine (ATARAX/VISTARIL) 25 MG tablet Take 1 tablet (25 mg total) by mouth at bedtime. 90 tablet 0  . ibuprofen (ADVIL,MOTRIN) 800 MG tablet Take 1 tablet (800 mg total) by mouth every 8 (eight) hours as needed for headache or moderate pain. (Patient not taking: Reported on 12/23/2015) 30 tablet 0  . levothyroxine (SYNTHROID, LEVOTHROID) 75 MCG tablet Take 1 tablet (75 mcg total) by mouth daily before breakfast. 30 tablet 0  . lithium carbonate 300 MG capsule Take 1 capsule (300 mg total) by mouth 3 (three) times daily with meals. 270 capsule 0  . lovastatin (MEVACOR) 20 MG tablet Take 20 mg by mouth at bedtime.    . prazosin (MINIPRESS) 1 MG capsule Take 1 capsule (1 mg total) by mouth at bedtime. 30 capsule 1  . predniSONE (DELTASONE) 20 MG tablet Take 20 mg by mouth as directed.    . topiramate (TOPAMAX) 100 MG tablet Take 200 mg by mouth 2 (two) times daily.    Marland Kitchen. topiramate (TOPAMAX) 200 MG tablet Take 1 tablet (200 mg total) by mouth at bedtime. (Patient not taking: Reported on 12/23/2015) 30 tablet 0  . Vitamin D, Ergocalciferol, (DRISDOL) 50000 units CAPS capsule Take 50,000 Units by mouth every 7 (seven) days. saturday     No current facility-administered medications for this visit.     Neurologic: Headache: Negative Seizure: Negative Paresthesias: Negative  Musculoskeletal: Strength & Muscle Tone: within normal limits Gait & Station: normal Patient leans: N/A  Psychiatric Specialty Exam: ROS  Blood pressure 122/70, pulse 88, height 5\' 11"  (1.803 m), weight 238 lb 3.2 oz (108 kg).Body mass index is 33.22 kg/m.  General Appearance: Fairly Groomed  Eye Contact:  Fair  Speech:  quiet  Volume:  Decreased  Mood:  Anxious  Affect:  Congruent  Thought Process:  Coherent  Orientation:  Full (Time, Place, and Person)  Thought Content: Logical   Suicidal Thoughts:  No  Homicidal Thoughts:  No  Memory:  Immediate;   Fair Recent;   Good Remote;    Good  Judgement:  Fair  Insight:  Fair  Psychomotor Activity:  wheel chair  Concentration:  Concentration: Good and Attention Span: Good  Recall:  Good  Fund of Knowledge: Good  Language: Good  Akathisia:  Negative  Handed:  Right  AIMS (if indicated):  0  Assets:  Communication Skills  Desire for Improvement Financial Resources/Insurance Housing Intimacy Social Support Transportation  ADL's:  Intact  Cognition: WNL  Sleep:  poor     Treatment Plan Summary: Schizophrenia:  Continue aripiprazole 30 mg for now.  Consider change if not better next visit.  Zyprexa worked well but weight gain was significant Anxiety:  I believe this may be the bigger issue at the moment and will increase the Valium to 5 mg tid and see if that helps enough Sleep:n  May take one of the Valium doses when she awakes during the night if needed Buspirone seems not to do much but will not change while trying a new dose of Valium Moodiness:  Continue Lithium 900 mg daily Return in 2 weeks   Carolanne Grumbling, MD 12/28/2015, 4:16 PM

## 2016-01-03 ENCOUNTER — Ambulatory Visit (INDEPENDENT_AMBULATORY_CARE_PROVIDER_SITE_OTHER): Payer: BLUE CROSS/BLUE SHIELD | Admitting: Clinical

## 2016-01-03 DIAGNOSIS — F200 Paranoid schizophrenia: Secondary | ICD-10-CM

## 2016-01-03 DIAGNOSIS — F4310 Post-traumatic stress disorder, unspecified: Secondary | ICD-10-CM | POA: Diagnosis not present

## 2016-01-03 NOTE — Progress Notes (Signed)
   THERAPIST PROGRESS NOTE  Session Time: 4:33 -5:30   Participation Level: Active  Behavioral Response: Casual and DisheveledAlertDepressed  Type of Therapy: Individual Therapy  Treatment Goals addressed: Improve Psychiatric Symptoms, Emotional Regulation Skills, Calming Skills, Healthy Coping Skills,   Interventions: Motivational Interviewing, Cognitive Behavior Therapy and Grounding/Mindfulness Skills, psychoeducation  Summary: Danielle [name] is [name] 45 y.o. female who presents with Schizophrenia and PTSD.   Suicidal/Homicidal: No - without intent/plan  Therapist Response:  Danielle [name] met with clinician for an individual session.  Danielle [name] discussed her psychiatric symptoms and her current life events. Danielle [name] shared that she was doing [name] little bit better than before. She shared she is sleeping better but is still having hallucinations and panic attacks. She shared her weight is slightly down ( she had gained [name] lot due to prior medication). Clinician asked open ended questions and Danielle [name] shared more about her experiences with her symptoms. Danielle [name] shared about some of the things that were increasing her stress level. Clinician asked open ended questions and Danielle [name] identified the things she could and could not change.  Client and clinician discussed coping skills. Client and clinician reviewed her grounding and mindfulness techniques. Client and clinician practiced some of the techniques together.   Plan: Return again in 2 weeks.  Diagnosis:     Axis I: Schizophrenia, paranoid type and PTSD   Danielle Bruyere A, LCSW 01/03/2016

## 2016-01-10 ENCOUNTER — Encounter (HOSPITAL_COMMUNITY): Payer: Self-pay | Admitting: Clinical

## 2016-01-11 ENCOUNTER — Ambulatory Visit (HOSPITAL_COMMUNITY): Payer: Self-pay | Admitting: Psychiatry

## 2016-01-11 ENCOUNTER — Encounter (HOSPITAL_COMMUNITY): Payer: Self-pay | Admitting: Psychiatry

## 2016-01-11 ENCOUNTER — Ambulatory Visit (INDEPENDENT_AMBULATORY_CARE_PROVIDER_SITE_OTHER): Payer: BLUE CROSS/BLUE SHIELD | Admitting: Psychiatry

## 2016-01-11 VITALS — BP 126/78 | HR 76 | Ht 71.0 in | Wt 238.0 lb

## 2016-01-11 DIAGNOSIS — F250 Schizoaffective disorder, bipolar type: Secondary | ICD-10-CM

## 2016-01-11 MED ORDER — LURASIDONE HCL 40 MG PO TABS: 40.0000 mg | ORAL_TABLET | Freq: Every day | ORAL | 0 refills | Status: DC

## 2016-01-11 NOTE — Progress Notes (Signed)
[facility] MD/PA/NP OP Progress Note  01/11/2016 11:35 AM Danielle [name]  MRN:  [id number]  Chief Complaint:  follow up with recent med changeSubjective:  Taking the diazepam at bed time helps sleep for a few hours and then takes it again to get about 3 hours more.  No change in the voices HPI: says she is taking 10 mg diazepam to get to sleep, sleeps for about 3 hours and takes the other 5 mg and gets 2 or 3 more hours.  The voices remain the same and bother her enough to want to try another medication.  The diazepam does not help with anxiety in the daytime she says.  No other new symptoms or problems to report. Visit Diagnosis: No diagnosis found.  Past Psychiatric History: no changes  Past Medical History:  Past Medical History:  Diagnosis Date  . Anemia   . Anxiety   . Bipolar 1 disorder (HCC)   . Depressed   . Headache(784.0)   . Heart murmur   . Hypothyroidism   . PTSD (post-traumatic stress disorder)   . Schizophrenia (HCC)   . Seizures (HCC)   . Shortness of breath     Past Surgical History:  Procedure Laterality Date  . FOOT SURGERY    . TUBAL LIGATION  1996    Family Psychiatric History: no changes  Family History:  Family History  Problem Relation Age of Onset  . Adopted: Yes    Social History:  Social History   Social History  . Marital status: Married    Spouse name: N/A  . Number of children: 2  . Years of education: N/A   Occupational History  . unemployed Not Employed   Social History Main Topics  . Smoking status: Former Smoker    Packs/day: 1.50    Years: 30.00    Types: Cigarettes    Quit date: 09/20/2011  . Smokeless tobacco: Never Used  . Alcohol use No  . Drug use: No  . Sexual activity: Yes    Birth control/ protection: Surgical   Other Topics Concern  . None   Social History Narrative   Pt is adopted. Unsure of family history.    Allergies:  Allergies  Allergen Reactions  . Tomato Rash    Pt reports that she breaks out in a rash  if she eats tomato based foods but can eat a whole tomato  . Imitrex [Sumatriptan Base] Hives  . Mustard Seed Rash    Also allergic to [facility]    Metabolic Disorder Labs: Lab Results  Component Value Date   HGBA1C 5.5 06/08/2015   MPG 123 (H) 02/05/2012   MPG 114 09/22/2011   Lab Results  Component Value Date   PROLACTIN 39.1 (H) 06/08/2015   Lab Results  Component Value Date   CHOL 218 (H) 06/08/2015   TRIG 215 (H) 06/08/2015   HDL 36 (L) 06/08/2015   CHOLHDL 6.1 06/08/2015   VLDL 43 (H) 06/08/2015   LDLCALC 139 (H) 06/08/2015   LDLCALC UNABLE TO CALCULATE IF TRIGLYCERIDE OVER 400 mg/dL 40/98/1191     Current Medications: Current Outpatient Prescriptions  Medication Sig Dispense Refill  . aspirin EC 81 MG tablet Take 1 tablet (81 mg total) by mouth daily with breakfast. 30 tablet 0  . benzonatate (TESSALON) 100 MG capsule Take 100 mg by mouth 3 (three) times daily as needed for cough.    . busPIRone (BUSPAR) 10 MG tablet Take 1 tablet (10 mg total) by mouth 2 (  two) times daily. 60 tablet 0  . cyanocobalamin 1000 MCG tablet Take 1 tablet (1,000 mcg total) by mouth daily. 30 tablet 0  . cyclobenzaprine (FLEXERIL) 10 MG tablet Take 10 mg by mouth 2 (two) times daily.    . diazepam (VALIUM) 5 MG tablet Take 1 tablet (5 mg total) by mouth every 8 (eight) hours as needed for anxiety. 60 tablet 0  . eletriptan (RELPAX) 40 MG tablet Take 40 mg by mouth as needed for migraine or headache. May repeat in 2 hours if headache persists or recurs.    . folic acid (FOLVITE) 1 MG tablet Take 1 tablet (1 mg total) by mouth daily. 30 tablet 0  . guaiFENesin (MUCINEX) 600 MG 12 hr tablet Take 1,200 mg by mouth 2 (two) times daily.    . hydrOXYzine (ATARAX/VISTARIL) 25 MG tablet Take 1 tablet (25 mg total) by mouth at bedtime. 90 tablet 0  . ibuprofen (ADVIL,MOTRIN) 800 MG tablet Take 1 tablet (800 mg total) by mouth every 8 (eight) hours as needed for headache or moderate pain. (Patient not  taking: Reported on 12/23/2015) 30 tablet 0  . levothyroxine (SYNTHROID, LEVOTHROID) 75 MCG tablet Take 1 tablet (75 mcg total) by mouth daily before breakfast. 30 tablet 0  . lithium carbonate 300 MG capsule Take 1 capsule (300 mg total) by mouth 3 (three) times daily with meals. 270 capsule 0  . lovastatin (MEVACOR) 20 MG tablet Take 20 mg by mouth at bedtime.    Marland Kitchen. lurasidone (LATUDA) 40 MG TABS tablet Take 1 tablet (40 mg total) by mouth daily with breakfast. 30 tablet 0  . prazosin (MINIPRESS) 1 MG capsule Take 1 capsule (1 mg total) by mouth at bedtime. 30 capsule 1  . predniSONE (DELTASONE) 20 MG tablet Take 20 mg by mouth as directed.    . topiramate (TOPAMAX) 100 MG tablet Take 200 mg by mouth 2 (two) times daily.    Marland Kitchen. topiramate (TOPAMAX) 200 MG tablet Take 1 tablet (200 mg total) by mouth at bedtime. (Patient not taking: Reported on 12/23/2015) 30 tablet 0  . Vitamin D, Ergocalciferol, (DRISDOL) 50000 units CAPS capsule Take 50,000 Units by mouth every 7 (seven) days. saturday     No current facility-administered medications for this visit.     Neurologic: Headache: Negative Seizure: Yes Paresthesias: Negative  Musculoskeletal: Strength & Muscle Tone: within normal limits Gait & Station: normal Patient leans: N/A  Psychiatric Specialty Exam: ROS  Blood pressure 126/78, pulse 76, height 5\' 11"  (1.803 m), weight 238 lb (108 kg).Body mass index is 33.19 kg/m.  General Appearance: Casual  Eye Contact:  Good  Speech:  Clear and Coherent  Volume:  Normal  Mood:  Anxious  Affect:  Congruent  Thought Process:  Coherent  Orientation:  Full (Time, Place, and Person)  Thought Content: Logical   Suicidal Thoughts:  No  Homicidal Thoughts:  No  Memory:  Immediate;   Good  Judgement:  Intact  Insight:  Fair  Psychomotor Activity:  Normal  Concentration:  Concentration: Good and Attention Span: Good  Recall:  Good  Fund of Knowledge: Good  Language: Good  Akathisia:  Negative   Handed:  Right  AIMS (if indicated):  0  Assets:  Communication Skills Desire for Improvement Financial Resources/Insurance Housing Intimacy Social Support Talents/Skills Transportation  ADL's:  Intact  Cognition: WNL  Sleep:  poor     Treatment Plan Summary:Sleep:  Will continue diazepam 10 mg hs and repeat 5 mg as needed.  Try lurasidone 40 mg which might help with sleep as well Schizophrenia:  Voices remain intense and bother her enough to want to try a new medication.  Will take 1/2 tab of the current 30 mg aripiprazole for 3 days and then discontinue.  Start lurasisone 40 mg at breakfast and if needed continue to increase as needed.  Warned about dystonia and to take Benadryl as for an allergy if needed Return to see Dr Lolly Mustache in one month for follow up and call if needed in the meantime   Carolanne Grumbling, MD 01/11/2016, 11:35 AM

## 2016-01-12 ENCOUNTER — Ambulatory Visit (HOSPITAL_COMMUNITY): Payer: Self-pay | Admitting: Psychiatry

## 2016-01-18 ENCOUNTER — Encounter (HOSPITAL_COMMUNITY): Payer: Self-pay | Admitting: Clinical

## 2016-01-18 ENCOUNTER — Ambulatory Visit (INDEPENDENT_AMBULATORY_CARE_PROVIDER_SITE_OTHER): Payer: BLUE CROSS/BLUE SHIELD | Admitting: Clinical

## 2016-01-18 DIAGNOSIS — F200 Paranoid schizophrenia: Secondary | ICD-10-CM

## 2016-01-18 DIAGNOSIS — F4310 Post-traumatic stress disorder, unspecified: Secondary | ICD-10-CM | POA: Diagnosis not present
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
## 2016-01-28 ENCOUNTER — Other Ambulatory Visit (HOSPITAL_COMMUNITY): Payer: Self-pay | Admitting: Psychiatry

## 2016-01-29 ENCOUNTER — Other Ambulatory Visit (HOSPITAL_COMMUNITY): Payer: Self-pay

## 2016-01-29 MED ORDER — DIAZEPAM 5 MG PO TABS: 5.0000 mg | ORAL_TABLET | Freq: Three times a day (TID) | ORAL | 0 refills | Status: DC | PRN

## 2016-01-29 MED ORDER — BUSPIRONE HCL 10 MG PO TABS: 10.0000 mg | ORAL_TABLET | Freq: Two times a day (BID) | ORAL | 0 refills | Status: DC
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
## 2016-01-30 ENCOUNTER — Telehealth (HOSPITAL_COMMUNITY): Payer: Self-pay | Admitting: *Deleted

## 2016-01-30 DIAGNOSIS — F250 Schizoaffective disorder, bipolar type: Secondary | ICD-10-CM
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
## 2016-02-01 ENCOUNTER — Ambulatory Visit (HOSPITAL_COMMUNITY): Payer: Self-pay | Admitting: Clinical

## 2016-02-01 MED ORDER — ARIPIPRAZOLE 10 MG PO TABS: 10.0000 mg | ORAL_TABLET | Freq: Every day | ORAL | 0 refills | Status: DC

## 2016-02-01 NOTE — Telephone Encounter (Addendum)
Met with Dr. Adele Schilder to discuss patient's current status with medications and problems with Latuda as patient's husband reported this was causing patient to hallucinate.  Dr. Adele Schilder instructed for plan for patient to stop Latuda and start Abilify 8m, one a day, #30 with no refills and for patient to keep appointment on 02/13/16.  [place]. [place]ita [place].  Agreed to send in new order and requested Mr. MBeanehave the hospital call patient's pharmacy to verify medication change.  Mr. MMeddersto call back if any problems or questions and plans to keep eval with Dr. AAdele Schilderfor 02/13/16.

## 2016-02-06 ENCOUNTER — Telehealth (HOSPITAL_COMMUNITY): Payer: Self-pay

## 2016-02-06 MED ORDER — OLANZAPINE 5 MG PO TABS: ORAL_TABLET | ORAL | 0 refills | Status: DC
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
## 2016-02-06 NOTE — Telephone Encounter (Signed)
I returned patient's husband phone call.  Husband told patient is complaining of hallucination.  He was recommended to stop Latuda and a start Abilify but he has not seen a huge improvement.  In the past she had a good response with Zyprexa along with Abilify.  However it was discontinued because she gained a lot of weight.  Patient and her husband at this time like to go back on Zyprexa because it works.  I will start Zyprexa 5 mg at bedtime and may repeat one more time needed.  However recommended to cut down his Valium to 5 mg twice a day.  I also suggested to call us back if symptoms do not improve.

## 2016-02-13 ENCOUNTER — Encounter (HOSPITAL_COMMUNITY): Payer: Self-pay | Admitting: Psychiatry

## 2016-02-13 ENCOUNTER — Ambulatory Visit (INDEPENDENT_AMBULATORY_CARE_PROVIDER_SITE_OTHER): Payer: BLUE CROSS/BLUE SHIELD | Admitting: Psychiatry

## 2016-02-13 DIAGNOSIS — F4310 Post-traumatic stress disorder, unspecified: Secondary | ICD-10-CM | POA: Diagnosis not present

## 2016-02-13 DIAGNOSIS — Z7982 Long term (current) use of aspirin: Secondary | ICD-10-CM

## 2016-02-13 DIAGNOSIS — F200 Paranoid schizophrenia: Secondary | ICD-10-CM | POA: Diagnosis not present

## 2016-02-13 DIAGNOSIS — Z79899 Other long term (current) drug therapy: Secondary | ICD-10-CM

## 2016-02-13 MED ORDER — PRAZOSIN HCL 1 MG PO CAPS: 1.0000 mg | ORAL_CAPSULE | Freq: Every day | ORAL | 0 refills | Status: DC

## 2016-02-13 MED ORDER — ALPRAZOLAM 0.5 MG PO TABS: 0.5000 mg | ORAL_TABLET | Freq: Three times a day (TID) | ORAL | 1 refills | Status: DC | PRN

## 2016-02-13 MED ORDER — HALOPERIDOL 5 MG PO TABS: ORAL_TABLET | ORAL | 0 refills | Status: DC
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
## 2016-02-14 ENCOUNTER — Other Ambulatory Visit (HOSPITAL_COMMUNITY): Payer: Self-pay

## 2016-02-14 DIAGNOSIS — F200 Paranoid schizophrenia: Secondary | ICD-10-CM

## 2016-02-14 MED ORDER — PRAZOSIN HCL 1 MG PO CAPS: 1.0000 mg | ORAL_CAPSULE | Freq: Every day | ORAL | 2 refills | Status: DC

## 2016-02-15 ENCOUNTER — Ambulatory Visit (INDEPENDENT_AMBULATORY_CARE_PROVIDER_SITE_OTHER): Payer: BLUE CROSS/BLUE SHIELD | Admitting: Clinical

## 2016-02-15 DIAGNOSIS — F200 Paranoid schizophrenia: Secondary | ICD-10-CM

## 2016-02-15 DIAGNOSIS — F4310 Post-traumatic stress disorder, unspecified: Secondary | ICD-10-CM | POA: Diagnosis not present

## 2016-02-21 ENCOUNTER — Encounter (HOSPITAL_COMMUNITY): Payer: Self-pay | Admitting: Clinical
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
## 2016-02-29 ENCOUNTER — Encounter (HOSPITAL_COMMUNITY): Payer: Self-pay | Admitting: Psychiatry

## 2016-02-29 ENCOUNTER — Ambulatory Visit (INDEPENDENT_AMBULATORY_CARE_PROVIDER_SITE_OTHER): Payer: BLUE CROSS/BLUE SHIELD | Admitting: Psychiatry

## 2016-02-29 DIAGNOSIS — Z79899 Other long term (current) drug therapy: Secondary | ICD-10-CM | POA: Diagnosis not present

## 2016-02-29 DIAGNOSIS — F200 Paranoid schizophrenia: Secondary | ICD-10-CM

## 2016-02-29 DIAGNOSIS — F250 Schizoaffective disorder, bipolar type: Secondary | ICD-10-CM

## 2016-02-29 MED ORDER — CITALOPRAM HYDROBROMIDE 10 MG PO TABS: 10.0000 mg | ORAL_TABLET | Freq: Every day | ORAL | 0 refills | Status: DC

## 2016-02-29 MED ORDER — PRAZOSIN HCL 2 MG PO CAPS: 2.0000 mg | ORAL_CAPSULE | Freq: Every day | ORAL | 0 refills | Status: DC

## 2016-02-29 MED ORDER — ARIPIPRAZOLE 20 MG PO TABS: 20.0000 mg | ORAL_TABLET | Freq: Every day | ORAL | 0 refills | Status: DC

## 2016-02-29 MED ORDER — TRAZODONE HCL 50 MG PO TABS: 50.0000 mg | ORAL_TABLET | Freq: Every evening | ORAL | 0 refills | Status: DC | PRN
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
## 2016-03-02 ENCOUNTER — Other Ambulatory Visit (HOSPITAL_COMMUNITY): Payer: Self-pay | Admitting: Psychiatry

## 2016-03-02 DIAGNOSIS — F250 Schizoaffective disorder, bipolar type: Secondary | ICD-10-CM

## 2016-03-11 ENCOUNTER — Other Ambulatory Visit (HOSPITAL_COMMUNITY): Payer: Self-pay | Admitting: Psychiatry

## 2016-03-11 DIAGNOSIS — F200 Paranoid schizophrenia: Secondary | ICD-10-CM

## 2016-03-21 ENCOUNTER — Ambulatory Visit (HOSPITAL_COMMUNITY): Payer: Self-pay | Admitting: Clinical

## 2016-03-26 ENCOUNTER — Encounter (HOSPITAL_COMMUNITY): Payer: Self-pay | Admitting: Psychiatry

## 2016-03-26 ENCOUNTER — Ambulatory Visit (INDEPENDENT_AMBULATORY_CARE_PROVIDER_SITE_OTHER): Payer: BLUE CROSS/BLUE SHIELD | Admitting: Psychiatry

## 2016-03-26 DIAGNOSIS — F419 Anxiety disorder, unspecified: Secondary | ICD-10-CM | POA: Diagnosis not present

## 2016-03-26 DIAGNOSIS — F200 Paranoid schizophrenia: Secondary | ICD-10-CM

## 2016-03-26 DIAGNOSIS — Z79899 Other long term (current) drug therapy: Secondary | ICD-10-CM | POA: Diagnosis not present

## 2016-03-26 DIAGNOSIS — F4310 Post-traumatic stress disorder, unspecified: Secondary | ICD-10-CM | POA: Diagnosis not present

## 2016-03-26 MED ORDER — ARIPIPRAZOLE 20 MG PO TABS: 20.0000 mg | ORAL_TABLET | Freq: Every day | ORAL | 2 refills | Status: DC

## 2016-03-26 MED ORDER — DIAZEPAM 5 MG PO TABS: 5.0000 mg | ORAL_TABLET | Freq: Two times a day (BID) | ORAL | 2 refills | Status: DC

## 2016-03-26 MED ORDER — CITALOPRAM HYDROBROMIDE 10 MG PO TABS: 10.0000 mg | ORAL_TABLET | Freq: Every day | ORAL | 2 refills | Status: DC

## 2016-03-26 MED ORDER — TRAZODONE HCL 50 MG PO TABS: 50.0000 mg | ORAL_TABLET | Freq: Every evening | ORAL | 2 refills | Status: DC | PRN

## 2016-03-26 MED ORDER — PRAZOSIN HCL 2 MG PO CAPS: 2.0000 mg | ORAL_CAPSULE | Freq: Every day | ORAL | 2 refills | Status: DC
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
## 2016-04-03 ENCOUNTER — Telehealth (HOSPITAL_COMMUNITY): Payer: Self-pay

## 2016-04-03 NOTE — Telephone Encounter (Signed)
Medication management - telephone message left for patient's husband this nurse received his call wanting to infomr Dr. Lolly MustacheArfeen his wife was still having a lot of problems with sleep and agreed to pass on to provider. Informed would contact collateral back once Dr. Lolly MustacheArfeen reviewed.

## 2016-04-04 ENCOUNTER — Ambulatory Visit (HOSPITAL_COMMUNITY): Payer: Self-pay | Admitting: Psychiatry

## 2016-04-04 NOTE — Telephone Encounter (Signed)
I returned phone call and spoke to the husband.  He mentioned that patient started to have nightmares at night and he preferred to go back on extra Valium at night which was helping her and sleep.  Currently patient is taking Valium 5 mg in the morning and 5 at bedtime.  I recommended to take half tablet extra at bedtime and see if it is working.  He will call us back if he needed more refills on Valium.

## 2016-04-11 ENCOUNTER — Ambulatory Visit (INDEPENDENT_AMBULATORY_CARE_PROVIDER_SITE_OTHER): Payer: BLUE CROSS/BLUE SHIELD | Admitting: Clinical

## 2016-04-11 DIAGNOSIS — F200 Paranoid schizophrenia: Secondary | ICD-10-CM

## 2016-04-11 DIAGNOSIS — F4310 Post-traumatic stress disorder, unspecified: Secondary | ICD-10-CM

## 2016-04-16 ENCOUNTER — Telehealth (HOSPITAL_COMMUNITY): Payer: Self-pay

## 2016-04-16 NOTE — Telephone Encounter (Signed)
Patient called, she said she has been taking her valium 1 during the day and 1 and 1/2 at bedtime. Since the prescription is written for 1 po bid so she is running out. Please review and advise, thank you

## 2016-04-17 ENCOUNTER — Other Ambulatory Visit (HOSPITAL_COMMUNITY): Payer: Self-pay

## 2016-04-17 DIAGNOSIS — F200 Paranoid schizophrenia: Secondary | ICD-10-CM

## 2016-04-17 MED ORDER — DIAZEPAM 5 MG PO TABS: ORAL_TABLET | ORAL | 1 refills | Status: DC

## 2016-04-17 NOTE — Telephone Encounter (Signed)
Change the Valium dosage reduction to 5 mg in the morning and 7.5 mg at bedtime.

## 2016-04-25 ENCOUNTER — Ambulatory Visit (INDEPENDENT_AMBULATORY_CARE_PROVIDER_SITE_OTHER): Payer: BLUE CROSS/BLUE SHIELD | Admitting: Clinical

## 2016-04-25 ENCOUNTER — Encounter (HOSPITAL_COMMUNITY): Payer: Self-pay | Admitting: Clinical

## 2016-04-25 DIAGNOSIS — F200 Paranoid schizophrenia: Secondary | ICD-10-CM | POA: Diagnosis not present

## 2016-04-25 DIAGNOSIS — F4310 Post-traumatic stress disorder, unspecified: Secondary | ICD-10-CM | POA: Diagnosis not present

## 2016-04-25 NOTE — Progress Notes (Signed)
THERAPIST PROGRESS NOTE  Session Time: 3:30 -4:25  Participation Level: Active  Behavioral Response: CasualAlertDepressed  Type of Therapy: Individual Therapy  Treatment Goals addressed: Improve Psychiatric Symptoms, Emotional Regulation Skills, Calming Skills, Healthy Coping Skills,  reduced irrational worries and fears  Interventions: Motivational Interviewing, Cognitive Behavior Therapy and Grounding/Mindfulness Skills, psychoeducation  Summary: Danielle [name] is a 44 y.o. female who presents with Schizophrenia and PTSD.   Suicidal/Homicidal: No - without intent/plan  Therapist Response:  Danielle [name] met with clinician for an individual session. Danielle [name] discussed her psychiatric symptoms and her current life events. Danielle [name] shared that she was doing a little bit better with her medication. She shared some about the positive aspects of her life. She also shared that there were some very challenging things going on in her life, including her daughter being admitted to the hospital and having an infection that is going to keep her there for a bit. Clinician asked open ended questions about her life events and also about her coping skills. Client and clinician discussed how to continue to improve her coping skills. Danielle [name] also shared about older son and a concern she was having for him. Clinician asked open ended questions about her life events and also about her coping skills. Danielle [name] identified what was in her power to change and what was not. Client and clinician reviewed her grounding skills and practiced some grounding techniques together. Danielle [name] remained present throughout the session.    Plan: Return again in 2 weeks.  Diagnosis:     Axis I: Schizophrenia, paranoid type and PTSD   , A, LCSW 04/25/2016  

## 2016-04-25 NOTE — Progress Notes (Signed)
   THERAPIST PROGRESS NOTE  Session Time: 4:30 - 5:30  Participation Level: Active  Behavioral Response: CasualAlertDepressed  Type of Therapy: Individual Therapy  Treatment Goals addressed: Improve Psychiatric Symptoms, Emotional Regulation Skills, Calming Skills, Healthy Coping Skills,  reduced irrational worries and fears  Interventions: Motivational Interviewing, Cognitive Behavior Therapy and Grounding/Mindfulness Skills,   Summary: Danielle [name] is a 45 y.o. female who presents with Schizophrenia and PTSD.   Suicidal/Homicidal: No - without intent/plan  Therapist Response:  Amylah met with clinician for an individual session. [name]'s husband joined the session to support her. [name] discussed her psychiatric symptoms, her current life events. [name] shared that she was really struggling emotionally. She shared that her daughters children are being taken away from her. [name] shared that it was not only very painful and complicated it was also triggering a lot of nightmares for her. She shared that her daughter is in the hospital and will be for several weeks more which is troubling in and of itself. She shared in addition her children are being removed and [name] may only have one last chance to see them. Clinician asked open ended questions and [name] shared both acceptance and guilt for not "being able to get herself together to take care of them." Clinician asked open ended questions about responsibility and her efforts to manage her mental health. [name] had the insight that she was doing all she could do and that the truth is it hurts to loose her grandchildren.Client and clinician practiced some grounding techniques together.  Plan: Return again in 2 weeks.  Diagnosis:     Axis I: Schizophrenia, paranoid type and PTSD    Cabell Lazenby A, LCSW 04/25/2016

## 2016-04-30 ENCOUNTER — Encounter (HOSPITAL_COMMUNITY): Payer: Self-pay | Admitting: Clinical

## 2016-05-09 ENCOUNTER — Ambulatory Visit (HOSPITAL_COMMUNITY): Payer: Self-pay | Admitting: Clinical

## 2016-05-11 ENCOUNTER — Other Ambulatory Visit (HOSPITAL_COMMUNITY): Payer: Self-pay | Admitting: Psychiatry

## 2016-05-11 DIAGNOSIS — F200 Paranoid schizophrenia: Secondary | ICD-10-CM

## 2016-05-15 ENCOUNTER — Other Ambulatory Visit (HOSPITAL_COMMUNITY): Payer: Self-pay | Admitting: Psychiatry

## 2016-05-16 ENCOUNTER — Telehealth (HOSPITAL_COMMUNITY): Payer: Self-pay

## 2016-05-16 DIAGNOSIS — F200 Paranoid schizophrenia: Secondary | ICD-10-CM

## 2016-05-16 MED ORDER — DIAZEPAM 5 MG PO TABS: ORAL_TABLET | ORAL | 0 refills | Status: DC

## 2016-05-16 NOTE — Telephone Encounter (Signed)
She can try valium 10 mg at bed time
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
## 2016-05-16 NOTE — Telephone Encounter (Signed)
Called in the new prescription and called patients husband to let him know.

## 2016-05-23 ENCOUNTER — Ambulatory Visit (HOSPITAL_COMMUNITY): Payer: Self-pay | Admitting: Clinical

## 2016-05-28 ENCOUNTER — Ambulatory Visit (INDEPENDENT_AMBULATORY_CARE_PROVIDER_SITE_OTHER): Payer: BLUE CROSS/BLUE SHIELD | Admitting: Clinical

## 2016-05-28 ENCOUNTER — Encounter (HOSPITAL_COMMUNITY): Payer: Self-pay | Admitting: Clinical

## 2016-05-28 DIAGNOSIS — F200 Paranoid schizophrenia: Secondary | ICD-10-CM

## 2016-05-28 DIAGNOSIS — F4310 Post-traumatic stress disorder, unspecified: Secondary | ICD-10-CM
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
## 2016-06-07 ENCOUNTER — Other Ambulatory Visit (HOSPITAL_COMMUNITY): Payer: Self-pay | Admitting: Psychiatry

## 2016-06-07 DIAGNOSIS — F200 Paranoid schizophrenia: Secondary | ICD-10-CM

## 2016-06-10 ENCOUNTER — Other Ambulatory Visit (HOSPITAL_COMMUNITY): Payer: Self-pay | Admitting: Psychiatry

## 2016-06-10 DIAGNOSIS — F200 Paranoid schizophrenia: Secondary | ICD-10-CM

## 2016-06-11 ENCOUNTER — Other Ambulatory Visit (HOSPITAL_COMMUNITY): Payer: Self-pay

## 2016-06-11 DIAGNOSIS — F200 Paranoid schizophrenia: Secondary | ICD-10-CM

## 2016-06-11 MED ORDER — DIAZEPAM 5 MG PO TABS: ORAL_TABLET | ORAL | 0 refills | Status: DC

## 2016-06-18 ENCOUNTER — Encounter (HOSPITAL_COMMUNITY): Payer: Self-pay | Admitting: Clinical

## 2016-06-18 ENCOUNTER — Ambulatory Visit (INDEPENDENT_AMBULATORY_CARE_PROVIDER_SITE_OTHER): Payer: BLUE CROSS/BLUE SHIELD | Admitting: Clinical

## 2016-06-18 DIAGNOSIS — F200 Paranoid schizophrenia: Secondary | ICD-10-CM

## 2016-06-18 DIAGNOSIS — F4310 Post-traumatic stress disorder, unspecified: Secondary | ICD-10-CM | POA: Diagnosis not present

## 2016-06-18 NOTE — Progress Notes (Signed)
   THERAPIST PROGRESS NOTE  Session Time: 3:30 - 4:28   Participation Level: Active  Behavioral Response: CasualAlertDepressed  Type of Therapy: Individual Therapy  Treatment Goals addressed: Improve Psychiatric Symptoms, Emotional Regulation Skills, Calming Skills, Healthy Coping Skills,  reduced irrational worries and fears  Interventions: Motivational Interviewing, Grounding/Mindfulness Skills,  Summary: Danielle [name] is a 46 y.o. female who presents with Schizophrenia and PTSD.   Suicidal/Homicidal: No - without intent/plan  Therapist Response:  Niley met with clinician for an individual session. Amena's husband joined the session to support her. Saraiya discussed her psychiatric symptoms, her current life events and her homework. Daianna shared that that she has been using her grounding and mindfulness techniques which been helpful but that the stressors of her life had been asked her high and caused her some increased symptoms with depression and anxiety. Clinician asked open ended questions and engage shared a bout some of her concerns a bout family members, financial issues, and she fell and bruised her chest so that the whole front of her body is in pain. Clinician asked open ended questions and Tashay shared her emotions. Clinician validated her emotions. Client and clinician discussed what was in the power for [place] and her husband to change and what was in her power. Keiera worked through Chief Executive Officer of them. She shared that it felt less stressful stressing than it did having it all run in her head. Client and clinician discussed the process of breaking things down into smaller steps. Client and clinician discussed additional grounding techniques. Client and clinician discussed lessening to and or watching funny things to improve mood. Ilka agreed to continue practicing her grounding and mindfulness techniques until next session.  Plan: Return again in 2 weeks.  Diagnosis:     Axis I:  Schizophrenia, paranoid type and PTSD   Zyler Hyson A, LCSW 06/18/2016

## 2016-06-20 ENCOUNTER — Ambulatory Visit (HOSPITAL_COMMUNITY): Payer: Self-pay | Admitting: Clinical

## 2016-06-22 ENCOUNTER — Other Ambulatory Visit (HOSPITAL_COMMUNITY): Payer: Self-pay | Admitting: Psychiatry

## 2016-06-22 DIAGNOSIS — F200 Paranoid schizophrenia: Secondary | ICD-10-CM

## 2016-06-24 ENCOUNTER — Other Ambulatory Visit (HOSPITAL_COMMUNITY): Payer: Self-pay | Admitting: Psychiatry

## 2016-06-26 ENCOUNTER — Telehealth (HOSPITAL_COMMUNITY): Payer: Self-pay

## 2016-06-26 ENCOUNTER — Ambulatory Visit (INDEPENDENT_AMBULATORY_CARE_PROVIDER_SITE_OTHER): Payer: BLUE CROSS/BLUE SHIELD | Admitting: Psychiatry

## 2016-06-26 ENCOUNTER — Encounter (HOSPITAL_COMMUNITY): Payer: Self-pay | Admitting: Psychiatry

## 2016-06-26 VITALS — BP 138/78 | HR 88 | Ht 71.0 in | Wt 268.4 lb

## 2016-06-26 DIAGNOSIS — F410 Panic disorder [episodic paroxysmal anxiety] without agoraphobia: Secondary | ICD-10-CM | POA: Diagnosis not present

## 2016-06-26 DIAGNOSIS — Z9851 Tubal ligation status: Secondary | ICD-10-CM | POA: Diagnosis not present

## 2016-06-26 DIAGNOSIS — Z7982 Long term (current) use of aspirin: Secondary | ICD-10-CM | POA: Diagnosis not present

## 2016-06-26 DIAGNOSIS — Z87891 Personal history of nicotine dependence: Secondary | ICD-10-CM

## 2016-06-26 DIAGNOSIS — Z79891 Long term (current) use of opiate analgesic: Secondary | ICD-10-CM | POA: Diagnosis not present

## 2016-06-26 DIAGNOSIS — F4310 Post-traumatic stress disorder, unspecified: Secondary | ICD-10-CM

## 2016-06-26 DIAGNOSIS — Z9889 Other specified postprocedural states: Secondary | ICD-10-CM

## 2016-06-26 DIAGNOSIS — F419 Anxiety disorder, unspecified: Secondary | ICD-10-CM

## 2016-06-26 DIAGNOSIS — Z91018 Allergy to other foods: Secondary | ICD-10-CM

## 2016-06-26 DIAGNOSIS — Z79899 Other long term (current) drug therapy: Secondary | ICD-10-CM

## 2016-06-26 DIAGNOSIS — Z888 Allergy status to other drugs, medicaments and biological substances status: Secondary | ICD-10-CM

## 2016-06-26 DIAGNOSIS — F200 Paranoid schizophrenia: Secondary | ICD-10-CM | POA: Diagnosis not present

## 2016-06-26 MED ORDER — ZIPRASIDONE HCL 20 MG PO CAPS: ORAL_CAPSULE | ORAL | 0 refills | Status: DC

## 2016-06-26 MED ORDER — PRAZOSIN HCL 2 MG PO CAPS: 2.0000 mg | ORAL_CAPSULE | Freq: Every day | ORAL | 2 refills | Status: DC

## 2016-06-26 MED ORDER — DIAZEPAM 5 MG PO TABS: ORAL_TABLET | ORAL | 0 refills | Status: DC

## 2016-06-26 MED ORDER — TRAZODONE HCL 50 MG PO TABS: 50.0000 mg | ORAL_TABLET | Freq: Every evening | ORAL | 2 refills | Status: DC | PRN

## 2016-06-26 NOTE — Progress Notes (Signed)
BH MD/PA/NP OP Progress Note  06/26/2016 10:56 AM Danielle [name]  MRN:  [id number]  Chief Complaint:  Subjective:  I'm still having panic attack.  I cannot sleep all night.  HPI: Danielle [name] came for her follow-up appointment with her husband.  She endorsed poor sleep, having anxiety and panic attack.  She continued to endorse hallucination and paranoia and some nights nightmare.  She endorse stressed about the family situation.  Her daughter is in jail 2 weeks ago because driving about tag, her 26 year old son does not do anything and recently they have told him to move out.  She endorse racing thoughts and paranoia.  Though she denies any dizziness but week ago she tripped and fell and hurt her body.  She was given pain medication which she is taking as needed.  She has gained a lot of weight in past few months.  She believes since started Abilify she is gaining weight.  In the past she had tried Zyprexa which also cause significant weight gain.  She is taking Valium 5 mg 3 times a day but she continued to endorse poor sleep.  She is taking trazodone 50 mg because her dose she could not tolerate very well.  She feel prazosin helping the nightmares but she still having get time to time.  She denies any suicidal thoughts.  She is seeing Danielle [name] for coping skills.  Visit Diagnosis:    ICD-9-CM ICD-10-CM   1. Schizophrenia, paranoid type (HCC) 295.30 F20.0 traZODone (DESYREL) 50 MG tablet     prazosin (MINIPRESS) 2 MG capsule     ziprasidone (GEODON) 20 MG capsule     diazepam (VALIUM) 5 MG tablet    Past Psychiatric History: Reviewed. Patient has multiple hospitalization mostly due to decompensation into psychiatric illness, suicidal attempt and severe depression.  Her last admission was at [facility] in October 2017 and earlier at [facility] in January 2017.  She has history of cutting herself and drowning herself .  In the past she had tried Zoloft, amitriptyline, Wellbutrin, latuda, xanax,  valium, Risperdal, lithium, Haldol, Depakote, Zyprexa  and Invega.  she developed l lithium toxicity in October 2017 .  She also tried Thorazine but she developed headaches.  Past Medical History:  Past Medical History:  Diagnosis Date  . Anemia   . Anxiety   . Bipolar 1 disorder (HCC)   . Depressed   . Headache(784.0)   . Heart murmur   . Hypothyroidism   . PTSD (post-traumatic stress disorder)   . Schizophrenia (HCC)   . Seizures (HCC)   . Shortness of breath     Past Surgical History:  Procedure Laterality Date  . FOOT SURGERY    . TUBAL LIGATION  1996    Family Psychiatric History: Reviewed.  Family History:  Family History  Problem Relation Age of Onset  . Adopted: Yes    Social History:  Social History   Social History  . Marital status: Married    Spouse name: N/A  . Number of children: 2  . Years of education: N/A   Occupational History  . unemployed Not Employed   Social History Main Topics  . Smoking status: Former Smoker    Packs/day: 1.50    Years: 30.00    Types: Cigarettes    Quit date: 09/20/2011  . Smokeless tobacco: Never Used  . Alcohol use No  . Drug use: No  . Sexual activity: Yes    Birth control/ protection: Surgical   Other  Topics Concern  . None   Social History Narrative   Pt is adopted. Unsure of family history.    Allergies:  Allergies  Allergen Reactions  . Tomato Rash    Pt reports that she breaks out in a rash if she eats tomato based foods but can eat a whole tomato  . Imitrex [Sumatriptan Base] Hives  . Mustard Seed Rash    Also allergic to [facility]    Metabolic Disorder Labs: Lab Results  Component Value Date   HGBA1C 5.5 06/08/2015   MPG 123 (H) 02/05/2012   MPG 114 09/22/2011   Lab Results  Component Value Date   PROLACTIN 39.1 (H) 06/08/2015   Lab Results  Component Value Date   CHOL 218 (H) 06/08/2015   TRIG 215 (H) 06/08/2015   HDL 36 (L) 06/08/2015   CHOLHDL 6.1 06/08/2015   VLDL 43 (H)  06/08/2015   LDLCALC 139 (H) 06/08/2015   LDLCALC UNABLE TO CALCULATE IF TRIGLYCERIDE OVER 400 mg/dL 16/02/9603     Current Medications: Current Outpatient Prescriptions  Medication Sig Dispense Refill  . albuterol (PROVENTIL HFA;VENTOLIN HFA) 108 (90 Base) MCG/ACT inhaler Inhale into the lungs.    . ARIPiprazole (ABILIFY) 20 MG tablet Take 1 tablet (20 mg total) by mouth daily. 30 tablet 2  . aspirin EC 81 MG tablet Take 1 tablet (81 mg total) by mouth daily with breakfast. 30 tablet 0  . citalopram (CELEXA) 10 MG tablet Take 1 tablet (10 mg total) by mouth daily. 30 tablet 2  . Co-Enzyme Q-10 100 MG CAPS Take by mouth.    . cyanocobalamin 1000 MCG tablet Take 1 tablet (1,000 mcg total) by mouth daily. 30 tablet 0  . cyclobenzaprine (FLEXERIL) 10 MG tablet Take 10 mg by mouth 2 (two) times daily.    . diazepam (VALIUM) 5 MG tablet 1 tab po daily and 2 tabs at bedtime 90 tablet 0  . folic acid (FOLVITE) 1 MG tablet Take 1 tablet (1 mg total) by mouth daily. 30 tablet 0  . levothyroxine (SYNTHROID, LEVOTHROID) 75 MCG tablet Take 1 tablet (75 mcg total) by mouth daily before breakfast. 30 tablet 0  . oxyCODONE-acetaminophen (PERCOCET/ROXICET) 5-325 MG tablet Take by mouth every 4 (four) hours as needed for severe pain.    . pravastatin (PRAVACHOL) 20 MG tablet Take by mouth.    . prazosin (MINIPRESS) 2 MG capsule Take 1 capsule (2 mg total) by mouth at bedtime. 30 capsule 2  . traZODone (DESYREL) 50 MG tablet Take 1 tablet (50 mg total) by mouth at bedtime as needed for sleep. 30 tablet 2  . Vitamin D, Ergocalciferol, (DRISDOL) 50000 units CAPS capsule Take 50,000 Units by mouth every 7 (seven) days. saturday    . ziprasidone (GEODON) 20 MG capsule Take I capsule in am and 20-40 mg 90 capsule 0   No current facility-administered medications for this visit.     Neurologic: Headache: Yes Seizure: No Paresthesias: No  Musculoskeletal: Strength & Muscle Tone: within normal limits Gait &  Station: normal Patient leans: N/A  Psychiatric Specialty Exam: Review of Systems  Constitutional: Positive for malaise/fatigue. Negative for weight loss.  HENT: Negative.   Respiratory: Negative for cough.   Cardiovascular: Negative for chest pain.  Gastrointestinal: Negative for heartburn.  Musculoskeletal: Positive for joint pain.  Skin: Negative.   Neurological: Positive for headaches. Negative for dizziness.  Psychiatric/Behavioral: The patient is nervous/anxious.     Blood pressure 138/78, pulse 88, height 5\' 11"  (1.803 m),  weight 268 lb 6.4 oz (121.7 kg).Body mass index is 37.43 kg/m.  General Appearance: Fairly Groomed  Eye Contact:  Fair  Speech:  fast and at times incoherent.  Volume:  Increased  Mood:  Anxious and Depressed  Affect:  Labile  Thought Process:  Descriptions of Associations: Circumstantial  Orientation:  Full (Time, Place, and Person)  Thought Content: Paranoid Ideation and Rumination   Suicidal Thoughts:  No  Homicidal Thoughts:  No  Memory:  Immediate;   Fair Recent;   Fair Remote;   Fair  Judgement:  Good  Insight:  Good  Psychomotor Activity:  Decreased  Concentration:  Concentration: Fair and Attention Span: Fair  Recall:  F[employer]ervFair  Fund of Knowledge: Fair  Language: Fair  Akathisia:  No  Handed:  Right  AIMS (if indicated):  0  Assets:  Communication Skills Desire for Improvement Housing Social Support  ADL's:  Intact  Cognition: WNL  Sleep:  Fair    Assessment: Schizophrenia chronic paranoid type.  Posttraumatic stress disorder.  Anxiety disorder NOS.  Panic attacks  Plan: Patient continued to struggle with the symptoms of depression and anxiety and panic attacks.  She is taking Valium 5 mg 3 times a day.  Though she denies any dizziness but recently fell which she believed due to trip and not taking pain medication.  I'm also concerned about her weight gain.  She had never tried Geodon, we will try Geodon 20 mg in the morning and 20-40  mg at bedtime depending on her tolerability.  Recommended to reduce Abilify 10 mg and we will eventually discontinued in the future.  For now continue Valium 5 mg 3 times a day and trazodone 50 mg as needed for insomnia and continue Celexa 10 mg daily.  I encouraged to continue therapy with Danielle LightningFrankie for coping and social skills.  Follow-up in 4 weeks. Discussed medication side effects and benefits.  Recommended to call us back if there is any question, concern or worsening of the symptoms.  Discuss safety plan that anytime having active suicidal thoughts or homicidal thoughts and she need to call 911 or go to the local emergency room.   Farron Lafond T., MD 06/26/2016, 10:56 AM

## 2016-06-26 NOTE — Telephone Encounter (Signed)
Patients pharmacy called, he is worried about an interaction between the Geodon and the Celexa. He stated that it can cause elongated q line and arrhythmia. Please review and advise, thank you

## 2016-06-27 MED ORDER — FLUOXETINE HCL 10 MG PO CAPS: 10.0000 mg | ORAL_CAPSULE | Freq: Every day | ORAL | 2 refills | Status: DC

## 2016-06-27 NOTE — Telephone Encounter (Signed)
Call pharmacy to discontinue Celexa.  We will try Prozac 10 mg and continue Geodon as prescribed.

## 2016-06-27 NOTE — Telephone Encounter (Signed)
Okay, patients Celexa has been discontinued and I sent in the Prozac. Patients husband is aware.

## 2016-07-02 ENCOUNTER — Ambulatory Visit (HOSPITAL_COMMUNITY): Payer: Self-pay | Admitting: Clinical

## 2016-07-03 ENCOUNTER — Other Ambulatory Visit (HOSPITAL_COMMUNITY): Payer: Self-pay

## 2016-07-09 ENCOUNTER — Ambulatory Visit (INDEPENDENT_AMBULATORY_CARE_PROVIDER_SITE_OTHER): Payer: BLUE CROSS/BLUE SHIELD | Admitting: Clinical

## 2016-07-09 ENCOUNTER — Encounter (HOSPITAL_COMMUNITY): Payer: Self-pay | Admitting: Clinical

## 2016-07-09 DIAGNOSIS — F200 Paranoid schizophrenia: Secondary | ICD-10-CM

## 2016-07-09 DIAGNOSIS — F4310 Post-traumatic stress disorder, unspecified: Secondary | ICD-10-CM | POA: Diagnosis not present

## 2016-07-09 NOTE — Progress Notes (Signed)
THERAPIST PROGRESS NOTE  Session Time: 4:30 - 5:25  Participation Level: Active  Behavioral Response: CasualAlertAnxious  Type of Therapy: Individual Therapy  Treatment Goals addressed: Improve Psychiatric Symptoms, Emotional Regulation Skills, Calming Skills,  Interventions: Motivational Interviewing, Cognitive Behavior Therapy, psychoeducation  Summary: Danielle [name] is a 46 y.o. female who presents with Schizophrenia and PTSD.   Suicidal/Homicidal: No - without intent/plan  Therapist Response:  Danielle [name] met with clinician for an individual session. Danielle [name]'s husband joined the session to support her. Danielle [name] discussed her psychiatric symptoms, her current life events. Clinician asked open ended questions.Danielle [name] shared that her symptoms have been increasing. She shared that she has had increased nightmares and that the PTSD events are now combining in her nightmares. She shared that the hallucinations and voices are increasing, she has had a lot of migraines and had a seizure the  other day. She shared she has an appointment with her psychiatrist and knows what to do if the symptoms become unmanageable before her appointment. Danielle [name] shared her frustration with her illness and her body's response to medication. Client and clinician reviewed and discussed her coping skills. Danielle [name] shared what was currently working and what was not. Client and clinician discussed additional coping skills and ways to improve the skills she is using.  Plan: Return again in 2 weeks.  Diagnosis:     Axis I: Schizophrenia, paranoid type and PTSD    Reynaldo Rossman A, LCSW 07/09/2016

## 2016-07-11 ENCOUNTER — Telehealth (HOSPITAL_COMMUNITY): Payer: Self-pay

## 2016-07-11 NOTE — Telephone Encounter (Signed)
Patient called and lvm for me, she states that she believes the Geodon is causing seizures, patient said she had 3 seizures back to back this morning. Please review and advise, thank you

## 2016-07-11 NOTE — Telephone Encounter (Signed)
I called patients husband and let him know that Dr. Lolly MustacheArfeen recommended seeing a Neurologist, he said that she has seen one and that these are pseudo seizures. He believes it is the 2 Geodon at night. I advised patients husband to make an appointment with neuro and if they felt it was the Geodon, we will stop it.

## 2016-07-17 ENCOUNTER — Telehealth (HOSPITAL_COMMUNITY): Payer: Self-pay

## 2016-07-17 NOTE — Telephone Encounter (Signed)
Okay, I can usually reach her husband [name]([name]) at [phone] - the other number in the chart is the house number and Bisma will frequently not answer it due to anxiety.

## 2016-07-17 NOTE — Telephone Encounter (Signed)
I returned patient's phone call and left a message to call us back. 

## 2016-07-17 NOTE — Telephone Encounter (Signed)
Call again and left message to call us back.

## 2016-07-17 NOTE — Telephone Encounter (Signed)
Patients husband is calling, he says that she is less drowsy since reducing the Geodon, however her anxiety and nightmares are increasing. Collateral is worried he may have to bring her to the hospital, but they are supposed to be going to his Grandfather[place] in [place] this weekend. He wants to know what you suggest. Please review and advise, thank you.

## 2016-07-18 ENCOUNTER — Telehealth (HOSPITAL_COMMUNITY): Payer: Self-pay | Admitting: Psychiatry

## 2016-07-18 NOTE — Telephone Encounter (Signed)
Spoke to patient's husband Delton Seeelson.  He mentioned they were out of the town due to feeling well and could not get hold on phone.  Patient tried Geodon 40 mg which helps his anxiety, panic attack, sleep but but causes pseudoseizures.  When he tried 20 mg her anxiety and panic attacks come back.  I recommended to go back on Abilify 20 mg and continue Geodon until patient tolerates well.  However if condition worst than call us back.

## 2016-07-18 NOTE — Telephone Encounter (Signed)
The best number to call is her husbands cell phone, it is [phone] - he usually always answers this phone.

## 2016-07-18 NOTE — Telephone Encounter (Signed)
I called again at [phone] to get in touch with patient or her husband but no response.  I left message to call us back.

## 2016-07-23 ENCOUNTER — Other Ambulatory Visit (HOSPITAL_COMMUNITY): Payer: Self-pay

## 2016-07-23 DIAGNOSIS — F200 Paranoid schizophrenia: Secondary | ICD-10-CM

## 2016-07-23 MED ORDER — ARIPIPRAZOLE 20 MG PO TABS: 20.0000 mg | ORAL_TABLET | Freq: Every day | ORAL | 0 refills | Status: DC

## 2016-07-25 ENCOUNTER — Ambulatory Visit (INDEPENDENT_AMBULATORY_CARE_PROVIDER_SITE_OTHER): Payer: BLUE CROSS/BLUE SHIELD | Admitting: Psychiatry

## 2016-07-25 ENCOUNTER — Encounter (HOSPITAL_COMMUNITY): Payer: Self-pay | Admitting: Psychiatry

## 2016-07-25 VITALS — BP 106/72 | HR 70 | Ht 71.0 in | Wt 252.0 lb

## 2016-07-25 DIAGNOSIS — F4310 Post-traumatic stress disorder, unspecified: Secondary | ICD-10-CM | POA: Diagnosis not present

## 2016-07-25 DIAGNOSIS — F419 Anxiety disorder, unspecified: Secondary | ICD-10-CM | POA: Diagnosis not present

## 2016-07-25 DIAGNOSIS — F200 Paranoid schizophrenia: Secondary | ICD-10-CM | POA: Diagnosis not present

## 2016-07-25 DIAGNOSIS — Z79899 Other long term (current) drug therapy: Secondary | ICD-10-CM

## 2016-07-25 DIAGNOSIS — Z87891 Personal history of nicotine dependence: Secondary | ICD-10-CM | POA: Diagnosis not present

## 2016-07-25 DIAGNOSIS — F410 Panic disorder [episodic paroxysmal anxiety] without agoraphobia: Secondary | ICD-10-CM | POA: Diagnosis not present

## 2016-07-25 MED ORDER — CLONAZEPAM 0.5 MG PO TABS: ORAL_TABLET | ORAL | 0 refills | Status: DC

## 2016-07-25 MED ORDER — LAMOTRIGINE 25 MG PO TABS: ORAL_TABLET | ORAL | 0 refills | Status: DC

## 2016-07-25 MED ORDER — PRAZOSIN HCL 2 MG PO CAPS: 2.0000 mg | ORAL_CAPSULE | Freq: Every day | ORAL | 0 refills | Status: DC

## 2016-07-25 MED ORDER — FLUOXETINE HCL 20 MG PO CAPS: 20.0000 mg | ORAL_CAPSULE | Freq: Every day | ORAL | 0 refills | Status: DC

## 2016-07-25 MED ORDER — TRAZODONE HCL 50 MG PO TABS: 50.0000 mg | ORAL_TABLET | Freq: Every evening | ORAL | 0 refills | Status: DC | PRN

## 2016-07-25 NOTE — Progress Notes (Signed)
BH MD/PA/NP OP Progress Note  07/25/2016 4:47 PM Danielle [name]  MRN:  [id number]  Chief Complaint:  Chief Complaint    Follow-up     Subjective:  I am having nightmares and panic attacks.  I cannot sleep.  HPI: Danielle [name] came for her follow-up appointment with her husband.  She has been calling us reporting having panic attacks and difficulty adjusting with the medication dosage.  If she takes her dose of Geodon she gets very sleepy tired and if she take low-dose Geodon she is having panic attacks and cannot sleep.  She is stressed about her family situation.  Her daughter recently took her pain medication and she is frustrated with her.  Today she is very labile, emotional, tearful and difficult to concentrate.  Her husband endorse that she is not able to sleep for past few days.  Patient is also very sensitive with psychiatric medication.  Either she have weight gain or dizziness.  She is seeing Tomma Lightning for counseling.  Lately she is having nightmares and flashback.  She does not feel Valium is helping her.  She is taking prazosin, trazodone, Abilify and Geodon.  Her appetite is fair.  She endorse paranoia and sometime visual hallucination but denies any suicidal thoughts or homicidal thoughts.  Visit Diagnosis:    ICD-9-CM ICD-10-CM   1. Schizophrenia, paranoid type (HCC) 295.30 F20.0 traZODone (DESYREL) 50 MG tablet     prazosin (MINIPRESS) 2 MG capsule     FLUoxetine (PROZAC) 20 MG capsule     clonazePAM (KLONOPIN) 0.5 MG tablet    Past Psychiatric History: Reviewed. Patient has multiple hospitalization due to her psychosis, suicidal attempt, severe depression.  Her last admission was at [facility] in October 2017.  She has history of cutting herself and drowning herself.  In the past she had tried Zoloft, amitriptyline, Wellbutrin, Latuda, Xanax, Valium, Risperdal, lithium, Haldol, Depakote, Zyprexa, Invega Valium and Geodon.  She also tried Thorazine but developed headaches.  She has  history of lithium toxicity.  Past Medical History:  Past Medical History:  Diagnosis Date  . Anemia   . Anxiety   . Bipolar 1 disorder (HCC)   . Depressed   . Headache(784.0)   . Heart murmur   . Hypothyroidism   . PTSD (post-traumatic stress disorder)   . Schizophrenia (HCC)   . Seizures (HCC)   . Shortness of breath     Past Surgical History:  Procedure Laterality Date  . FOOT SURGERY    . TUBAL LIGATION  1996    Family Psychiatric History: Reviewed.  Family History:  Family History  Problem Relation Age of Onset  . Adopted: Yes    Social History:  Social History   Social History  . Marital status: Married    Spouse name: N/A  . Number of children: 2  . Years of education: N/A   Occupational History  . unemployed Not Employed   Social History Main Topics  . Smoking status: Former Smoker    Packs/day: 1.50    Years: 30.00    Types: Cigarettes    Quit date: 09/20/2011  . Smokeless tobacco: Never Used  . Alcohol use No  . Drug use: No  . Sexual activity: Yes    Partners: Male    Birth control/ protection: Surgical   Other Topics Concern  . None   Social History Narrative   Pt is adopted. Unsure of family history.    Allergies:  Allergies  Allergen Reactions  . Tomato  Rash    Pt reports that she breaks out in a rash if she eats tomato based foods but can eat a whole tomato  . Imitrex [Sumatriptan Base] Hives  . Mustard Seed Rash    Also allergic to [facility]    Metabolic Disorder Labs: Lab Results  Component Value Date   HGBA1C 5.5 06/08/2015   MPG 123 (H) 02/05/2012   MPG 114 09/22/2011   Lab Results  Component Value Date   PROLACTIN 39.1 (H) 06/08/2015   Lab Results  Component Value Date   CHOL 218 (H) 06/08/2015   TRIG 215 (H) 06/08/2015   HDL 36 (L) 06/08/2015   CHOLHDL 6.1 06/08/2015   VLDL 43 (H) 06/08/2015   LDLCALC 139 (H) 06/08/2015   LDLCALC UNABLE TO CALCULATE IF TRIGLYCERIDE OVER 400 mg/dL 16/10/960405/19/2013     Current  Medications: Current Outpatient Prescriptions  Medication Sig Dispense Refill  . albuterol (PROVENTIL HFA;VENTOLIN HFA) 108 (90 Base) MCG/ACT inhaler Inhale into the lungs.    . ARIPiprazole (ABILIFY) 20 MG tablet Take 1 tablet (20 mg total) by mouth daily. 90 tablet 0  . aspirin EC 81 MG tablet Take 1 tablet (81 mg total) by mouth daily with breakfast. 30 tablet 0  . clonazePAM (KLONOPIN) 0.5 MG tablet Take 1 tab in afternoon and 2 bed time 90 tablet 0  . Co-Enzyme Q-10 100 MG CAPS Take by mouth.    . cyanocobalamin 1000 MCG tablet Take 1 tablet (1,000 mcg total) by mouth daily. 30 tablet 0  . cyclobenzaprine (FLEXERIL) 10 MG tablet Take 10 mg by mouth 2 (two) times daily.    Marland Kitchen. FLUoxetine (PROZAC) 20 MG capsule Take 1 capsule (20 mg total) by mouth daily. 30 capsule 0  . folic acid (FOLVITE) 1 MG tablet Take 1 tablet (1 mg total) by mouth daily. 30 tablet 0  . levothyroxine (SYNTHROID, LEVOTHROID) 75 MCG tablet Take 1 tablet (75 mcg total) by mouth daily before breakfast. 30 tablet 0  . pravastatin (PRAVACHOL) 20 MG tablet Take by mouth.    . prazosin (MINIPRESS) 2 MG capsule Take 1 capsule (2 mg total) by mouth at bedtime. 30 capsule 0  . traZODone (DESYREL) 50 MG tablet Take 1 tablet (50 mg total) by mouth at bedtime as needed for sleep. 30 tablet 0  . Vitamin D, Ergocalciferol, (DRISDOL) 50000 units CAPS capsule Take 50,000 Units by mouth every 7 (seven) days. saturday     No current facility-administered medications for this visit.     Neurologic: Headache: Yes Seizure: No Paresthesias: No  Musculoskeletal: Strength & Muscle Tone: within normal limits Gait & Station: unsteady, Feels dizzy sometimes. Patient leans: Front  Psychiatric Specialty Exam: Review of Systems  Constitutional: Positive for malaise/fatigue and weight loss.  HENT: Negative.   Cardiovascular: Negative.   Musculoskeletal: Positive for back pain.  Skin: Negative.   Neurological: Positive for dizziness.   Psychiatric/Behavioral: Positive for hallucinations. The patient is nervous/anxious and has insomnia.     Blood pressure 106/72, pulse 70, height 5\' 11"  (1.803 m), weight 252 lb (114.3 kg).Body mass index is 35.15 kg/m.  General Appearance: Casual and Disheveled  Eye Contact:  Minimal  Speech:  Rambling  Volume:  Normal  Mood:  Anxious, Depressed and Emotional and tearful  Affect:  Labile  Thought Process:  Descriptions of Associations: Circumstantial  Orientation:  Full (Time, Place, and Person)  Thought Content: Hallucinations: Visual, Paranoid Ideation and Rumination   Suicidal Thoughts:  No  Homicidal Thoughts:  No  Memory:  Immediate;   Fair Recent;   Fair Remote;   Fair  Judgement:  Fair  Insight:  Good  Psychomotor Activity:  Psychomotor Retardation  Concentration:  Concentration: Fair and Attention Span: Fair  Recall:  Fair  Fund of Knowledge: Good  Language: Fair  Akathisia:  No  Handed:  Right  AIMS (if indicated):  0  Assets:  Desire for Improvement Housing Social Support  ADL's:  Intact  Cognition: WNLpoor  Sleep:  Poor.      Assessment: Schizophrenia chronic paranoid type.  Post traumatic stress disorder.  Anxiety disorder NOS.  Panic attacks.  Plan: Patient is slowly decompensating.  It has been challenged as patient is very sensitive his psychiatric medication.  I recommended discontinue Geodon since she cannot tolerate higher dose.  Discontinue Valium as patient does not feel it is working.  We will try Klonopin 0.5 mg in the morning and 1 mg at bedtime.  I will also increase Prozac 20 mg daily.  Continue trazodone 50 mg at bedtime and Abilify 20 mg daily.  We will start Lamictal as patient do not recall taking this medication.  We will start 25 mg for 1 week and then 50 mg daily.  I recommended if she developed a rash and she needed to stop the medication immediately.  She has appointment to see Tomma Lightning on second for counseling.  I will see her again in 3-4  weeks.  Discussed medication side effects and benefits.  Discuss safety plan that anytime having active suicidal thoughts or homicidal thoughts then she need to call 911 or go to the local emergency room.  Follow-up in 4 weeks.  Miyana Mordecai T., MD 07/25/2016, 4:47 PM

## 2016-07-31 ENCOUNTER — Other Ambulatory Visit (HOSPITAL_COMMUNITY): Payer: Self-pay

## 2016-08-02 ENCOUNTER — Telehealth (HOSPITAL_COMMUNITY): Payer: Self-pay

## 2016-08-02 NOTE — Telephone Encounter (Signed)
Patient is calling because she does not feel like the klonopin is working, I verified some of patients medications. She is taking Flexeril, Tizanidine and Oxycodone. Per Dr. Lolly Mustache, he does not want to increase her klonopin until she comes off of the pain medication. I called the patient and let her know - she will come and see me on the 4th when she sees [place]

## 2016-08-06 ENCOUNTER — Emergency Department (HOSPITAL_COMMUNITY)
Admission: EM | Admit: 2016-08-06 | Discharge: 2016-08-07 | Disposition: A | Discharge status: Other Acute Inpatient Hospital | Payer: BLUE CROSS/BLUE SHIELD | Attending: Emergency Medicine | Admitting: Emergency Medicine

## 2016-08-06 ENCOUNTER — Telehealth (HOSPITAL_COMMUNITY): Payer: Self-pay

## 2016-08-06 ENCOUNTER — Ambulatory Visit (HOSPITAL_COMMUNITY)
Admission: RE | Admit: 2016-08-06 | Discharge: 2016-08-06 | Disposition: A | Discharge status: Home / Self Care | Payer: BLUE CROSS/BLUE SHIELD | Source: Home / Self Care | Attending: Psychiatry | Admitting: Psychiatry

## 2016-08-06 ENCOUNTER — Encounter (HOSPITAL_COMMUNITY): Payer: Self-pay | Admitting: Emergency Medicine

## 2016-08-06 DIAGNOSIS — Z79899 Other long term (current) drug therapy: Secondary | ICD-10-CM | POA: Diagnosis not present

## 2016-08-06 DIAGNOSIS — F251 Schizoaffective disorder, depressive type: Secondary | ICD-10-CM

## 2016-08-06 DIAGNOSIS — F200 Paranoid schizophrenia: Secondary | ICD-10-CM | POA: Diagnosis not present

## 2016-08-06 DIAGNOSIS — Z7982 Long term (current) use of aspirin: Secondary | ICD-10-CM | POA: Diagnosis not present

## 2016-08-06 DIAGNOSIS — Z87891 Personal history of nicotine dependence: Secondary | ICD-10-CM | POA: Diagnosis not present

## 2016-08-06 DIAGNOSIS — R45851 Suicidal ideations: Secondary | ICD-10-CM

## 2016-08-06 DIAGNOSIS — E039 Hypothyroidism, unspecified: Secondary | ICD-10-CM | POA: Insufficient documentation

## 2016-08-06 DIAGNOSIS — F410 Panic disorder [episodic paroxysmal anxiety] without agoraphobia: Secondary | ICD-10-CM

## 2016-08-06 DIAGNOSIS — F419 Anxiety disorder, unspecified: Secondary | ICD-10-CM

## 2016-08-06 LAB — COMPREHENSIVE METABOLIC PANEL
ALBUMIN: 4.4 g/dL (ref 3.5–5.0)
ALT: 9 U/L — ABNORMAL LOW (ref 14–54)
ANION GAP: 8 (ref 5–15)
AST: 13 U/L — ABNORMAL LOW (ref 15–41)
Alkaline Phosphatase: 82 U/L (ref 38–126)
BILIRUBIN TOTAL: 0.2 mg/dL — AB (ref 0.3–1.2)
BUN: 14 mg/dL (ref 6–20)
CALCIUM: 9.5 mg/dL (ref 8.9–10.3)
CO2: 22 mmol/L (ref 22–32)
Chloride: 108 mmol/L (ref 101–111)
Creatinine, Ser: 0.78 mg/dL (ref 0.44–1.00)
GFR calc Af Amer: >60 mL/min (ref >60)
GFR calc non Af Amer: >60 mL/min (ref >60)
GLUCOSE: 102 mg/dL — AB (ref 65–99)
Potassium: 3.9 mmol/L (ref 3.5–5.1)
Sodium: 138 mmol/L (ref 135–145)
TOTAL PROTEIN: 7.9 g/dL (ref 6.5–8.1)

## 2016-08-06 LAB — CBC
HEMATOCRIT: 36.4 % (ref 36.0–46.0)
Hemoglobin: 11.3 g/dL — ABNORMAL LOW (ref 12.0–15.0)
MCH: 25.8 pg — ABNORMAL LOW (ref 26.0–34.0)
MCHC: 31 g/dL (ref 30.0–36.0)
MCV: 83.1 fL (ref 78.0–100.0)
PLATELETS: 388 10*3/uL (ref 150–400)
RBC: 4.38 MIL/uL (ref 3.87–5.11)
RDW: 15.5 % (ref 11.5–15.5)
WBC: 10.8 10*3/uL — ABNORMAL HIGH (ref 4.0–10.5)

## 2016-08-06 LAB — ETHANOL: Alcohol, Ethyl (B): <5 mg/dL (ref <5)

## 2016-08-06 LAB — SALICYLATE LEVEL: Salicylate Lvl: <7 mg/dL (ref 2.8–30.0)

## 2016-08-06 LAB — RAPID URINE DRUG SCREEN, HOSP PERFORMED
Amphetamines: NOT DETECTED
BARBITURATES: NOT DETECTED
Benzodiazepines: POSITIVE — AB
Cocaine: NOT DETECTED
Opiates: NOT DETECTED
Tetrahydrocannabinol: NOT DETECTED

## 2016-08-06 LAB — ACETAMINOPHEN LEVEL

## 2016-08-06 MED ORDER — LAMOTRIGINE 25 MG PO TABS
50.0000 mg | ORAL_TABLET | Freq: Every day | ORAL | Status: DC
  Administered 2016-08-07: 50 mg via ORAL
  Filled 2016-08-06: qty 2

## 2016-08-06 MED ORDER — IBUPROFEN 200 MG PO TABS
600.0000 mg | ORAL_TABLET | Freq: Three times a day (TID) | ORAL | Status: DC | PRN
  Administered 2016-08-07: 600 mg via ORAL
  Filled 2016-08-06: qty 3

## 2016-08-06 MED ORDER — FLUOXETINE HCL 10 MG PO CAPS
10.0000 mg | ORAL_CAPSULE | Freq: Every day | ORAL | Status: DC
  Administered 2016-08-07: 10 mg via ORAL
  Filled 2016-08-06: qty 1

## 2016-08-06 MED ORDER — ARIPIPRAZOLE 10 MG PO TABS
20.0000 mg | ORAL_TABLET | Freq: Every day | ORAL | Status: DC
  Administered 2016-08-07: 20 mg via ORAL
  Filled 2016-08-06: qty 2

## 2016-08-06 MED ORDER — PRAZOSIN HCL 2 MG PO CAPS
2.0000 mg | ORAL_CAPSULE | Freq: Every day | ORAL | Status: DC
  Administered 2016-08-06: 2 mg via ORAL
  Filled 2016-08-06 (×2): qty 1

## 2016-08-06 MED ORDER — LORAZEPAM 1 MG PO TABS
1.0000 mg | ORAL_TABLET | Freq: Once | ORAL | Status: AC
  Administered 2016-08-06: 1 mg via ORAL
  Filled 2016-08-06: qty 1

## 2016-08-06 MED ORDER — ACETAMINOPHEN 325 MG PO TABS: 650.0000 mg | ORAL_TABLET | ORAL | Status: DC | PRN

## 2016-08-06 MED ORDER — TIZANIDINE HCL 4 MG PO TABS
4.0000 mg | ORAL_TABLET | Freq: Every day | ORAL | Status: DC
  Administered 2016-08-06: 8 mg via ORAL
  Filled 2016-08-06: qty 2

## 2016-08-06 MED ORDER — ACETAMINOPHEN 500 MG PO TABS
1000.0000 mg | ORAL_TABLET | Freq: Once | ORAL | Status: AC
  Administered 2016-08-06: 1000 mg via ORAL
  Filled 2016-08-06: qty 2

## 2016-08-06 MED ORDER — TOPIRAMATE 100 MG PO TABS
100.0000 mg | ORAL_TABLET | Freq: Two times a day (BID) | ORAL | Status: DC
  Administered 2016-08-07: 100 mg via ORAL
  Filled 2016-08-06 (×2): qty 1

## 2016-08-06 MED ORDER — VITAMIN B-12 1000 MCG PO TABS
1000.0000 ug | ORAL_TABLET | Freq: Every day | ORAL | Status: DC
  Administered 2016-08-07: 1000 ug via ORAL
  Filled 2016-08-06: qty 1

## 2016-08-06 MED ORDER — LEVOTHYROXINE SODIUM 75 MCG PO TABS
75.0000 ug | ORAL_TABLET | Freq: Every day | ORAL | Status: DC
Start: 2016-08-07 — End: 2016-08-07
  Administered 2016-08-07: 75 ug via ORAL
  Filled 2016-08-06: qty 1

## 2016-08-06 MED ORDER — LORAZEPAM 1 MG PO TABS
1.0000 mg | ORAL_TABLET | Freq: Three times a day (TID) | ORAL | Status: DC | PRN
  Administered 2016-08-07: 1 mg via ORAL
  Filled 2016-08-06: qty 1

## 2016-08-06 MED ORDER — CLONAZEPAM 1 MG PO TABS
1.0000 mg | ORAL_TABLET | Freq: Every evening | ORAL | Status: DC
  Administered 2016-08-06: 1 mg via ORAL
  Filled 2016-08-06: qty 1

## 2016-08-06 MED ORDER — FOLIC ACID 1 MG PO TABS
1.0000 mg | ORAL_TABLET | Freq: Every day | ORAL | Status: DC
  Administered 2016-08-06 – 2016-08-07 (×2): 1 mg via ORAL
  Filled 2016-08-06 (×2): qty 1

## 2016-08-06 MED ORDER — PRAVASTATIN SODIUM 20 MG PO TABS
20.0000 mg | ORAL_TABLET | Freq: Every day | ORAL | Status: DC
  Administered 2016-08-06: 20 mg via ORAL
  Filled 2016-08-06 (×2): qty 1

## 2016-08-06 MED ORDER — TRAZODONE HCL 50 MG PO TABS
50.0000 mg | ORAL_TABLET | Freq: Every evening | ORAL | Status: DC | PRN
  Administered 2016-08-06: 50 mg via ORAL
  Filled 2016-08-06: qty 1

## 2016-08-06 MED ORDER — ALUM & MAG HYDROXIDE-SIMETH 200-200-20 MG/5ML PO SUSP: 30.0000 mL | ORAL | Status: DC | PRN

## 2016-08-06 MED ORDER — ASPIRIN EC 81 MG PO TBEC
81.0000 mg | DELAYED_RELEASE_TABLET | Freq: Every day | ORAL | Status: DC
  Administered 2016-08-07: 81 mg via ORAL
  Filled 2016-08-06: qty 1

## 2016-08-06 NOTE — ED Notes (Signed)
Has had assessment at [facility] to ED for medical clearance-patient to go to [facility] until bed available at [facility]

## 2016-08-06 NOTE — ED Notes (Signed)
Bed: [facility]4 Expected date:  Expected time:  Means of arrival:  Comments: 

## 2016-08-06 NOTE — ED Notes (Signed)
Bed: [facility]39 Expected date:  Expected time:  Means of arrival:  Comments: Hold for triage  

## 2016-08-06 NOTE — Telephone Encounter (Signed)
Called the [facility] Danielle [name]) and explained the situation, she said to have the patient come in as a walk in. Patient is willing to sign herself in. I called patients husband and he is agreeable to this plan, he is going to bring patient in today.
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
## 2016-08-06 NOTE — ED Notes (Signed)
Bed: YQM57 Expected date:  Expected time:  Means of arrival:  Comments: Triage
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
## 2016-08-06 NOTE — Telephone Encounter (Signed)
This is a patient of Dr. Lolly Mustache, her husband [name]) called this morning to let me know she is not doing well. She wants to go inpatient - but she is not suicidal. Patient has been having increased panic attacks during the day, and nightmares at night. When she has panic attacks, she calls her husband at work non stop. He is not sure what to do, he said he does not mind bringing her to [facility] ED, but he knows that they would just end up sending her home after a few hours or a night. Patient is a complicated case, and I advised the husband that I would talk to you, but that I did not know if you would be able to change meds. Please review and advise, thank you
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
## 2016-08-06 NOTE — Telephone Encounter (Signed)
Have them come to behavioral health for walk in
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
## 2016-08-07 ENCOUNTER — Ambulatory Visit (HOSPITAL_COMMUNITY): Payer: Self-pay | Admitting: Clinical

## 2016-08-07 DIAGNOSIS — R45851 Suicidal ideations: Secondary | ICD-10-CM | POA: Diagnosis not present

## 2016-08-07 DIAGNOSIS — Z87891 Personal history of nicotine dependence: Secondary | ICD-10-CM

## 2016-08-07 DIAGNOSIS — F200 Paranoid schizophrenia: Secondary | ICD-10-CM

## 2016-08-07 MED ORDER — MIRTAZAPINE 7.5 MG PO TABS
7.5000 mg | ORAL_TABLET | Freq: Every day | ORAL | Status: DC
Start: 2016-08-07 — End: 2016-08-07
  Filled 2016-08-07: qty 1

## 2016-08-07 MED ORDER — GABAPENTIN 300 MG PO CAPS
300.0000 mg | ORAL_CAPSULE | Freq: Three times a day (TID) | ORAL | Status: DC
  Administered 2016-08-07: 300 mg via ORAL
  Filled 2016-08-07: qty 1

## 2016-08-07 NOTE — ED Notes (Signed)
Spoke with [facility] about transporting patient to [facility] [place].

## 2016-08-07 NOTE — ED Notes (Signed)
Pt was ambulatory with [facility] to be transported to [facility]. Belongings given to [facility]

## 2016-08-07 NOTE — ED Notes (Signed)
Spoke with patients spouse, [name]. He has agreed to take patents C-pap machine to [facility] after 17:00 today.

## 2016-08-07 NOTE — Consult Note (Signed)
[facility] Consult   Reason for Consult:  Suicidal ideations Referring Physician:  EDP Patient Identification: Danielle [name] MRN:  [id number] Principal Diagnosis: Paranoid schizophrenia [facility]) Diagnosis:   Patient Active Problem List   Diagnosis Date Noted  . Paranoid schizophrenia ([place]) [F20.0]     Priority: High  . Frequent falls [R29.6] 06/18/2015  . Tobacco use disorder [F17.200] 06/08/2015  . Hyperammonemia ([place]) [E72.20] 06/08/2015  . Migraine headache [G43.909] 06/04/2012  . Seizure ([place]) [R56.9] 06/04/2012  . Hypothyroidism [E03.9] 06/04/2012  . OSA (obstructive sleep apnea) [G47.33] 08/29/2011  . Post traumatic stress disorder (PTSD) [F43.10] 07/31/2011    Total Time spent with patient: 45 minutes  Subjective:   Danielle [name] is a 46 y.o. female patient admitted with suicide plan to overdose.  HPI:  46 yo female presented to the ED with suicidal ideations with plan to overdose.  Her husband is currently taking care of 1 of their 2 children at a hospital in [facility] after surgery.  Doreene became anxious with increase in depression.  No homicidal ideations,hallucinations, or substance abuse.  Continues to endorse suicidal ideations and plan.    Past Psychiatric History: schizophrenia  Risk to Self: Yes Risk to Others:  None Prior Inpatient Therapy:  Multiple times Prior Outpatient Therapy:  [facility]  Past Medical History:  Past Medical History:  Diagnosis Date  . Anemia   . Anxiety   . Bipolar 1 disorder ([place])   . Depressed   . Headache(784.0)   . Heart murmur   . Hypothyroidism   . PTSD (post-traumatic stress disorder)   . Schizophrenia ([place])   . Seizures ([place])   . Shortness of breath     Past Surgical History:  Procedure Laterality Date  . FOOT SURGERY    . TUBAL LIGATION  1996   Family History:  Family History  Problem Relation Age of Onset  . Adopted: Yes   Family Psychiatric  History: unknown Social History:  History  Alcohol  Use No     History  Drug Use No    Social History   Social History  . Marital status: Married    Spouse name: N/A  . Number of children: 2  . Years of education: N/A   Occupational History  . unemployed Not Employed   Social History Main Topics  . Smoking status: Former Smoker    Packs/day: 1.50    Years: 30.00    Types: Cigarettes    Quit date: 09/20/2011  . Smokeless tobacco: Never Used  . Alcohol use No  . Drug use: No  . Sexual activity: Yes    Partners: Male    Birth control/ protection: Surgical   Other Topics Concern  . None   Social History Narrative   Pt is adopted. Unsure of family history.   Additional Social History:    Allergies:   Allergies  Allergen Reactions  . Tomato Rash    Pt reports that she breaks out in a rash if she eats tomato based foods but can eat a whole tomato  . Imitrex [Sumatriptan Base] Hives  . Mustard Seed Rash    Also allergic to [employer]:  Results for orders placed or performed during the hospital encounter of 08/06/16 (from the past 48 hour(s))  Rapid urine drug screen (hospital performed)     Status: Abnormal   Collection Time: 08/06/16  1:31 PM  Result Value Ref Range   Opiates NONE DETECTED NONE DETECTED  Cocaine NONE DETECTED NONE DETECTED   Benzodiazepines POSITIVE (A) NONE DETECTED   Amphetamines NONE DETECTED NONE DETECTED   Tetrahydrocannabinol NONE DETECTED NONE DETECTED   Barbiturates NONE DETECTED NONE DETECTED    Comment:        DRUG SCREEN FOR MEDICAL PURPOSES ONLY.  IF CONFIRMATION IS NEEDED FOR ANY PURPOSE, NOTIFY LAB WITHIN 5 DAYS.        LOWEST DETECTABLE LIMITS FOR URINE DRUG SCREEN Drug Class       Cutoff (ng/mL) Amphetamine      1000 Barbiturate      200 Benzodiazepine   092 Tricyclics       330 Opiates          300 Cocaine          300 THC              50   Comprehensive metabolic panel     Status: Abnormal   Collection Time: 08/06/16  1:39 PM  Result Value Ref Range   Sodium  138 135 - 145 mmol/L   Potassium 3.9 3.5 - 5.1 mmol/L   Chloride 108 101 - 111 mmol/L   CO2 22 22 - 32 mmol/L   Glucose, Bld 102 (H) 65 - 99 mg/dL   BUN 14 6 - 20 mg/dL   Creatinine, Ser 0.78 0.44 - 1.00 mg/dL   Calcium 9.5 8.9 - 10.3 mg/dL   Total Protein 7.9 6.5 - 8.1 g/dL   Albumin 4.4 3.5 - 5.0 g/dL   AST 13 (L) 15 - 41 U/L   ALT 9 (L) 14 - 54 U/L   Alkaline Phosphatase 82 38 - 126 U/L   Total Bilirubin 0.2 (L) 0.3 - 1.2 mg/dL   GFR calc non Af Amer >60 >60 mL/min   GFR calc Af Amer >60 >60 mL/min    Comment: (NOTE) The eGFR has been calculated using the CKD EPI equation. This calculation has not been validated in all clinical situations. eGFR's persistently <60 mL/min signify possible Chronic Kidney Disease.    Anion gap 8 5 - 15  Ethanol     Status: None   Collection Time: 08/06/16  1:39 PM  Result Value Ref Range   Alcohol, Ethyl (B) <5 <5 mg/dL    Comment:        LOWEST DETECTABLE LIMIT FOR SERUM ALCOHOL IS 5 mg/dL FOR MEDICAL PURPOSES ONLY   Salicylate level     Status: None   Collection Time: 08/06/16  1:39 PM  Result Value Ref Range   Salicylate Lvl <0.7 2.8 - 30.0 mg/dL  Acetaminophen level     Status: Abnormal   Collection Time: 08/06/16  1:39 PM  Result Value Ref Range   Acetaminophen (Tylenol), Serum <10 (L) 10 - 30 ug/mL    Comment:        THERAPEUTIC CONCENTRATIONS VARY SIGNIFICANTLY. A RANGE OF 10-30 ug/mL MAY BE AN EFFECTIVE CONCENTRATION FOR MANY PATIENTS. HOWEVER, SOME ARE BEST TREATED AT CONCENTRATIONS OUTSIDE THIS RANGE. ACETAMINOPHEN CONCENTRATIONS >150 ug/mL AT 4 HOURS AFTER INGESTION AND >50 ug/mL AT 12 HOURS AFTER INGESTION ARE OFTEN ASSOCIATED WITH TOXIC REACTIONS.   cbc     Status: Abnormal   Collection Time: 08/06/16  1:39 PM  Result Value Ref Range   WBC 10.8 (H) 4.0 - 10.5 K/uL   RBC 4.38 3.87 - 5.11 MIL/uL   Hemoglobin 11.3 (L) 12.0 - 15.0 g/dL   HCT 36.4 36.0 - 46.0 %   MCV 83.1 78.0 - 100.0  fL   MCH 25.8 (L) 26.0 - 34.0  pg   MCHC 31.0 30.0 - 36.0 g/dL   RDW 15.5 11.5 - 15.5 %   Platelets 388 150 - 400 K/uL    Current Facility-Administered Medications  Medication Dose Route Frequency Provider Last Rate Last Dose  . acetaminophen (TYLENOL) tablet 650 mg  650 mg Oral Q4H PRN Doristine Devoid, PA-C      . alum & mag hydroxide-simeth (MAALOX/MYLANTA) 200-200-20 MG/5ML suspension 30 mL  30 mL Oral PRN Doristine Devoid, PA-C      . ARIPiprazole (ABILIFY) tablet 20 mg  20 mg Oral Daily Doristine Devoid, PA-C   20 mg at 08/07/16 1112  . aspirin EC tablet 81 mg  81 mg Oral Q breakfast Doristine Devoid, PA-C   81 mg at 08/07/16 0804  . clonazePAM (KLONOPIN) tablet 1 mg  1 mg Oral Nightly Doristine Devoid, PA-C   1 mg at 08/06/16 2158  . FLUoxetine (PROZAC) capsule 10 mg  10 mg Oral Daily Doristine Devoid, PA-C   10 mg at 08/07/16 1112  . folic acid (FOLVITE) tablet 1 mg  1 mg Oral Daily Doristine Devoid, PA-C   1 mg at 08/07/16 1113  . ibuprofen (ADVIL,MOTRIN) tablet 600 mg  600 mg Oral Q8H PRN Doristine Devoid, PA-C   600 mg at 08/07/16 0804  . lamoTRIgine (LAMICTAL) tablet 50 mg  50 mg Oral Daily Doristine Devoid, PA-C   50 mg at 08/07/16 1112  . levothyroxine (SYNTHROID, LEVOTHROID) tablet 75 mcg  75 mcg Oral QAC breakfast Doristine Devoid, PA-C   75 mcg at 08/07/16 0804  . pravastatin (PRAVACHOL) tablet 20 mg  20 mg Oral QHS Doristine Devoid, PA-C   20 mg at 08/06/16 2203  . prazosin (MINIPRESS) capsule 2 mg  2 mg Oral QHS Doristine Devoid, PA-C   2 mg at 08/06/16 2202  . tiZANidine (ZANAFLEX) tablet 4-8 mg  4-8 mg Oral QHS Doristine Devoid, PA-C   8 mg at 08/06/16 2157  . topiramate (TOPAMAX) tablet 100 mg  100 mg Oral BID Doristine Devoid, PA-C   100 mg at 08/07/16 1113  . traZODone (DESYREL) tablet 50 mg  50 mg Oral QHS PRN Doristine Devoid, PA-C   50 mg at 08/06/16 2157  . vitamin B-12 (CYANOCOBALAMIN) tablet 1,000 mcg  1,000 mcg Oral Daily Doristine Devoid, PA-C   1,000 mcg at  08/07/16 1112   Current Outpatient Prescriptions  Medication Sig Dispense Refill  . ARIPiprazole (ABILIFY) 20 MG tablet Take 1 tablet (20 mg total) by mouth daily. 90 tablet 0  . aspirin EC 81 MG tablet Take 1 tablet (81 mg total) by mouth daily with breakfast. 30 tablet 0  . clonazePAM (KLONOPIN) 0.5 MG tablet Take 1 tab in afternoon and 2 bed time 90 tablet 0  . cyanocobalamin 1000 MCG tablet Take 1 tablet (1,000 mcg total) by mouth daily. 30 tablet 0  . FLUoxetine (PROZAC) 20 MG capsule Take 1 capsule (20 mg total) by mouth daily. (Patient taking differently: Take 10 mg by mouth daily. ) 30 capsule 0  . folic acid (FOLVITE) 1 MG tablet Take 1 tablet (1 mg total) by mouth daily. 30 tablet 0  . HYDROcodone-acetaminophen (NORCO/VICODIN) 5-325 MG tablet Take 1 tablet by mouth every 6 (six) hours as needed for pain.    Marland Kitchen ibuprofen (ADVIL,MOTRIN) 800 MG tablet Take 800 mg by mouth every 8 (eight)  hours as needed (pain).    Marland Kitchen lamoTRIgine (LAMICTAL) 25 MG tablet Take 1 tab daily for 1 week and than 2 tab daily (Patient taking differently: Take 50 mg by mouth daily. Take 1 tab daily for 1 week and than 2 tab daily) 60 tablet 0  . levothyroxine (SYNTHROID, LEVOTHROID) 75 MCG tablet Take 1 tablet (75 mcg total) by mouth daily before breakfast. 30 tablet 0  . pravastatin (PRAVACHOL) 20 MG tablet Take 20 mg by mouth at bedtime.     . prazosin (MINIPRESS) 2 MG capsule Take 1 capsule (2 mg total) by mouth at bedtime. 30 capsule 0  . predniSONE (DELTASONE) 20 MG tablet Take 20-60 mg by mouth as directed. Take 32m by mouth three times daily on days 1-3, then take 267mby mouth two times daily on days 4-6, then take 2077my mouth daily on days 7-9.    . tMarland KitchenZANidine (ZANAFLEX) 4 MG tablet Take 4-8 mg by mouth at bedtime.   6  . topiramate (TOPAMAX) 50 MG tablet Take 100 mg by mouth 2 (two) times daily.    . traZODone (DESYREL) 50 MG tablet Take 1 tablet (50 mg total) by mouth at bedtime as needed for sleep. 30  tablet 0  . Vitamin D, Ergocalciferol, (DRISDOL) 50000 units CAPS capsule Take 50,000 Units by mouth every 7 (seven) days. saturday      Musculoskeletal: Strength & Muscle Tone: within normal limits Gait & Station: normal Patient leans: N/A  Psychiatric Specialty Exam: Physical Exam  Constitutional: She is oriented to person, place, and time. She appears well-developed and well-nourished.  HENT:  Head: Normocephalic.  Respiratory: Effort normal.  Musculoskeletal: Normal range of motion.  Neurological: She is alert and oriented to person, place, and time.  Psychiatric: Her speech is normal and behavior is normal. Judgment normal. Cognition and memory are normal. She exhibits a depressed mood. She expresses suicidal ideation. She expresses suicidal plans.    Review of Systems  Psychiatric/Behavioral: Positive for depression and suicidal ideas. The patient is nervous/anxious.   All other systems reviewed and are negative.   Blood pressure 112/74, pulse (!) 51, temperature 97.9 F (36.6 C), temperature source Oral, resp. rate 16, height _0  (1.803 m), weight 118.8 kg (262 lb), SpO2 97 %.Body mass index is 36.54 kg/m.  General Appearance: Casual  Eye Contact:  Fair  Speech:  Normal Rate  Volume:  Decreased  Mood:  Depressed  Affect:  Congruent  Thought Process:  Coherent and Descriptions of Associations: Intact  Orientation:  Full (Time, Place, and Person)  Thought Content:  Rumination  Suicidal Thoughts:  Yes.  with intent/plan  Homicidal Thoughts:  No  Memory:  Immediate;   Fair Recent;   Fair Remote;   Fair  Judgement:  Poor  Insight:  Fair  Psychomotor Activity:  Decreased  Concentration:  Concentration: Fair and Attention Span: Fair  Recall:  [employer] Knowledge:  Fair  Language:  Good  Akathisia:  No  Handed:  Right  AIMS (if indicated):     Assets:  Housing Intimacy Leisure Time Resilience Social Support  ADL's:  Intact  Cognition:  WNL  Sleep:         Treatment Plan Summary: Daily contact with patient to assess and evaluate symptoms and progress in treatment, Medication management and Plan schizophrenia, paranoid type:  -Crisis stabilization -Medication management:  Restarted medical medications along with Abilify 20 mg daily for mood stabilization, Klonopin 1 mg at bedtime for anxiety,  Trazodone 50 mg at bedtime PRN sleep, Prozac 20 mg daily for depression, and Prazosin 2 mg at bedtime for sleep -Individual counseling -Transfer to [facility] inpatient  Disposition: Recommend psychiatric Inpatient admission when medically cleared.  Waylan Boga, NP 08/07/2016 12:20 PM  Patient seen face-to-face for psychiatric evaluation, chart reviewed and case discussed with the physician extender and developed treatment plan. Reviewed the information documented and agree with the treatment plan. Corena Pilgrim, MD

## 2016-08-07 NOTE — BH Assessment (Addendum)
[facility] Assessment Progress Note  Per Thedore Mins, MD, this voluntary pt requires psychiatric hospitalization at this time.  This Clinical research associate has contacted several area psychiatric facilities, and at 13:45 Morrie Sheldon calls from [facility].  She reports that they are willing to accept pt, provided that she presents with her CPAP.  Once she has it in hand, ED staff is to call [facility] [phone]), and they will provide the name of the accepting physician.  Pt's nurse, Marchelle Folks, reports that she has called pt's husband to make arrangements for CPAP to be delivered.  I have spoken to pt and she agrees to transfer under voluntary status.  When the time comes, please ask EDP to enter EMTALA, and arrange for pt to be transported via [facility].  Nanine Means, DNP concurs with this decision.  Final disposition is pending as of this writing.  Doylene Canning, MA Triage Specialist [phone]  Addendum:  At 15:47 Morrie Sheldon calls from [facility], reporting that they are willing to accept pt with CPAP.  Accepting physician is Dr. Wendall Stade, and pt will go to the [facility].  Please call report to [phone].  Pt's nurse, Randa Evens, has been notified.  Pt is to be transported via [facility].  She agrees to sign consent for admission upon arrival.  Doylene Canning, [place] Triage Specialist [phone]

## 2016-08-07 NOTE — ED Notes (Signed)
Spoke to [facility], they will accept her as soon as her machine for her OSA arrives

## 2016-08-07 NOTE — ED Notes (Signed)
Awaiting her husband to bring her cpap machine so she can be tx to [facility]

## 2016-08-07 NOTE — ED Notes (Signed)
Spoke to husband, hes at hospital with their child, he will have her c pap machine here as soon as he can

## 2016-08-07 NOTE — ED Provider Notes (Signed)
Accepted for transfer.     Vitals:   08/07/16 0508 08/07/16 1428  BP: 112/74 137/85  Pulse: (!) 51 (!) 59  Resp: 16 16  Temp: 97.9 F (36.6 C) 97.7 F (36.5 C)    Medically stable for transfer   Linwood Dibbles, MD 08/07/16 1557

## 2016-08-09 ENCOUNTER — Telehealth (HOSPITAL_COMMUNITY): Payer: Self-pay

## 2016-08-09 NOTE — Telephone Encounter (Signed)
I received a call from Alric Ran at [facility] to let us know that patient is there with them, she was just transferred to [facility] ED for head pain and slurring of speech. She said she would be calling back for follow up appointments. I called her back and LVM letting her know that Patient had appointments with the doctor and the therapist, I gave her the times and dates of those appointments.

## 2016-08-20 ENCOUNTER — Encounter (HOSPITAL_COMMUNITY): Payer: Self-pay | Admitting: Clinical

## 2016-08-20 ENCOUNTER — Ambulatory Visit (INDEPENDENT_AMBULATORY_CARE_PROVIDER_SITE_OTHER): Payer: BLUE CROSS/BLUE SHIELD | Admitting: Clinical

## 2016-08-20 DIAGNOSIS — F4310 Post-traumatic stress disorder, unspecified: Secondary | ICD-10-CM

## 2016-08-20 DIAGNOSIS — F200 Paranoid schizophrenia: Secondary | ICD-10-CM | POA: Diagnosis not present

## 2016-08-20 NOTE — Progress Notes (Signed)
   THERAPIST PROGRESS NOTE  Session Time: 4:30 - 5:25  Participation Level: Active  Behavioral Response: CasualAlertAnxious and Depressed  Type of Therapy: Individual Therapy  Treatment Goals addressed: Improve Psychiatric Symptoms, Emotional Regulation Skills, Calming Skills, Healthy Coping Skills, reduced irrational worries and fears  Interventions: Motivational Interviewing, Cognitive Behavior Therapy and Grounding Skills,   Summary: Danielle [name] is [name] 46 y.o. female who presents with Schizophrenia and PTSD.   Suicidal/Homicidal: No - without intent/plan  Therapist Response:  Danielle [name] met with clinician for an individual session. Danielle [name]'s husband joined the session to support her. [name] discussed her psychiatric symptoms, her current life events. Danielle [name] shared that was hospitalized voluntarily because of suicidal ideation. Clinician asked open ended questions. She shared that she was having night terrors and panic attacks during the day. She shared that she was very upset because while at [facility] she had to go to the hospital 3 times unescorted.  She shared that she is frustrated because she would like her symptoms to decrease. Client and clinician discussed healthy coping skills she could use now that she is home. Clinician taught Danielle [name] [name] new grounding technique . Client and clinician practiced the technique together. Client and clinician discussed how she could use her grounding to help interrupt her anxious thoughts and help calm her.  Plan: Return again in 2 weeks.  Diagnosis:     Axis I: Schizophrenia, paranoid type and PTSD    Danielle Bence A, LCSW 08/20/2016

## 2016-08-21 ENCOUNTER — Other Ambulatory Visit (HOSPITAL_COMMUNITY): Payer: Self-pay | Admitting: Psychiatry

## 2016-08-21 DIAGNOSIS — F200 Paranoid schizophrenia: Secondary | ICD-10-CM

## 2016-08-29 ENCOUNTER — Encounter (HOSPITAL_COMMUNITY): Payer: Self-pay | Admitting: Psychiatry

## 2016-08-29 ENCOUNTER — Ambulatory Visit (INDEPENDENT_AMBULATORY_CARE_PROVIDER_SITE_OTHER): Payer: BLUE CROSS/BLUE SHIELD | Admitting: Psychiatry

## 2016-08-29 VITALS — BP 131/75 | HR 90 | Ht 71.0 in | Wt 252.0 lb

## 2016-08-29 DIAGNOSIS — F200 Paranoid schizophrenia: Secondary | ICD-10-CM

## 2016-08-29 DIAGNOSIS — F4310 Post-traumatic stress disorder, unspecified: Secondary | ICD-10-CM

## 2016-08-29 DIAGNOSIS — F419 Anxiety disorder, unspecified: Secondary | ICD-10-CM

## 2016-08-29 DIAGNOSIS — Z87891 Personal history of nicotine dependence: Secondary | ICD-10-CM

## 2016-08-29 DIAGNOSIS — Z79899 Other long term (current) drug therapy: Secondary | ICD-10-CM

## 2016-08-29 DIAGNOSIS — F410 Panic disorder [episodic paroxysmal anxiety] without agoraphobia: Secondary | ICD-10-CM

## 2016-08-29 MED ORDER — LORAZEPAM 1 MG PO TABS: ORAL_TABLET | ORAL | 0 refills | Status: DC

## 2016-08-29 MED ORDER — PAROXETINE HCL ER 37.5 MG PO TB24: 37.5000 mg | ORAL_TABLET | Freq: Every morning | ORAL | 0 refills | Status: DC

## 2016-08-29 MED ORDER — PRAZOSIN HCL 1 MG PO CAPS: 1.0000 mg | ORAL_CAPSULE | Freq: Every day | ORAL | 0 refills | Status: DC

## 2016-08-29 MED ORDER — PALIPERIDONE ER 9 MG PO TB24: 9.0000 mg | ORAL_TABLET | Freq: Every day | ORAL | 0 refills | Status: DC

## 2016-08-29 NOTE — Progress Notes (Signed)
BH MD/PA/NP OP Progress Note  08/29/2016 5:08 PM Danielle [name]  MRN:  [id number]  Chief Complaint:  Subjective:  I was admitted at old [facility] hospital this month.  I stay 12 days.  My medicines are changed.    HPI: Danielle [name] came for her follow-up appointment with her husband.  Patient was again admitted to old [facility] hospital earlier this month due to decompensation.  Initially she was seen in the psychiatric emergency room because of nightmares, flashback, feeling hopelessness worthlessness and very depressed.  She was also complaining of right foot pain because of arthritis.  She was hearing voices and feeling very scared and could not function.  At old [facility] her medicines were changed.  She is no longer taking Abilify, Lamictal, Minipress, trazodone and Klonopin.  Now she is taking Invega 9 mg, Paxil 37.5 mg and imipramine 25 mg .  She also taking temazepam 15 mg at bedtime.  Patient did not see any improvement with change in her medication.  She continued to endorse paranoia, hallucination, panic and anxiety attacks.  She still have nightmares and flashback.  She remember in the old [place] she developed tachycardia and she was taken to the [facility] and now she is taking propranolol.  In past one year she had tried numerous psychiatric medication but either she developed side effects including weight gain, dizziness, postural hypotension or the don't work as good.  Patient is hoping that new medication Lorayne Bender may help her.  She still have hallucination and panic attacks but they are less intense.  She sleeping 3-4 hours.  She wants to give more time to this new medication.  However she like to go back on Minipress because her flashbacks and nightmares are not getting better with imipramine.  Also she feel very anxious and nervous during the day.  She no longer taking Klonopin.  Patient denies any suicidal thoughts or homicidal thought.  Her appetite is fair.  Her energy  level is low.  Patient denies drinking alcohol or using any illegal substances.  She lives with her husband is very supportive.   Visit Diagnosis:    ICD-9-CM ICD-10-CM   1. PTSD (post-traumatic stress disorder) 309.81 F43.10 prazosin (MINIPRESS) 1 MG capsule     paliperidone (INVEGA) 9 MG 24 hr tablet     PARoxetine (PAXIL-CR) 37.5 MG 24 hr tablet     LORazepam (ATIVAN) 1 MG tablet  2. Schizophrenia, paranoid type (Danielle [name]) 295.30 F20.0 prazosin (MINIPRESS) 1 MG capsule     paliperidone (INVEGA) 9 MG 24 hr tablet    Past Psychiatric History: Reviewed. Patient has multiple hospitalization do to psychosis, paranoia, hallucination, suicidal attempt, extreme panic attack and severe depression.  Her last admission was in April 2018 at old [facility].  She has at least 4 suicidal attempt.  She has history of cutting herself and drowning herself.  In the past she has tried multiple psychiatric medication with limited response.  She has taken Zoloft, amitriptyline, Wellbutrin, Latuda, Xanax, Valium, Risperdal, lithium, Haldol, Depakote, Zyprexa, Geodon and recently Lamictal.  At her recent hospitalization at old [place], she was started on Paxil, temazepam, imipramine and Invega.   Past Medical History:  Past Medical History:  Diagnosis Date  . Anemia   . Anxiety   . Bipolar 1 disorder ([place])   . Depressed   . Headache(784.0)   . Heart murmur   . Hypothyroidism   . PTSD (post-traumatic stress disorder)   . Schizophrenia ([place])   .  Seizures ([place])   . Shortness of breath     Past Surgical History:  Procedure Laterality Date  . FOOT SURGERY    . TUBAL LIGATION  1996    Family Psychiatric History: Reviewed.  Family History:  Family History  Problem Relation Age of Onset  . Adopted: Yes    Social History:  Social History   Social History  . Marital status: Married    Spouse name: N/A  . Number of children: 2  . Years of education: N/A   Occupational History  . unemployed  Not Employed   Social History Main Topics  . Smoking status: Former Smoker    Packs/day: 1.50    Years: 30.00    Types: Cigarettes    Quit date: 09/20/2011  . Smokeless tobacco: Never Used  . Alcohol use No  . Drug use: No  . Sexual activity: Yes    Partners: Male    Birth control/ protection: Surgical   Other Topics Concern  . Not on file   Social History Narrative   Pt is adopted. Unsure of family history.    Allergies:  Allergies  Allergen Reactions  . Tomato Rash    Pt reports that she breaks out in a rash if she eats tomato based foods but can eat a whole tomato  . Imitrex [Sumatriptan Base] Hives  . Mustard Seed Rash    Also allergic to [facility]    Metabolic Disorder Labs: Recent Results (from the past 2160 hour(s))  Rapid urine drug screen (hospital performed)     Status: Abnormal   Collection Time: 08/06/16  1:31 PM  Result Value Ref Range   Opiates NONE DETECTED NONE DETECTED   Cocaine NONE DETECTED NONE DETECTED   Benzodiazepines POSITIVE (A) NONE DETECTED   Amphetamines NONE DETECTED NONE DETECTED   Tetrahydrocannabinol NONE DETECTED NONE DETECTED   Barbiturates NONE DETECTED NONE DETECTED    Comment:        DRUG SCREEN FOR MEDICAL PURPOSES ONLY.  IF CONFIRMATION IS NEEDED FOR ANY PURPOSE, NOTIFY LAB WITHIN 5 DAYS.        LOWEST DETECTABLE LIMITS FOR URINE DRUG SCREEN Drug Class       Cutoff (ng/mL) Amphetamine      1000 Barbiturate      200 Benzodiazepine   037 Tricyclics       048 Opiates          300 Cocaine          300 THC              50   Comprehensive metabolic panel     Status: Abnormal   Collection Time: 08/06/16  1:39 PM  Result Value Ref Range   Sodium 138 135 - 145 mmol/L   Potassium 3.9 3.5 - 5.1 mmol/L   Chloride 108 101 - 111 mmol/L   CO2 22 22 - 32 mmol/L   Glucose, Bld 102 (H) 65 - 99 mg/dL   BUN 14 6 - 20 mg/dL   Creatinine, Ser 0.78 0.44 - 1.00 mg/dL   Calcium 9.5 8.9 - 10.3 mg/dL   Total Protein 7.9 6.5 - 8.1 g/dL    Albumin 4.4 3.5 - 5.0 g/dL   AST 13 (L) 15 - 41 U/L   ALT 9 (L) 14 - 54 U/L   Alkaline Phosphatase 82 38 - 126 U/L   Total Bilirubin 0.2 (L) 0.3 - 1.2 mg/dL   GFR calc non Af Amer >60 >60 mL/min  GFR calc Af Amer >60 >60 mL/min    Comment: (NOTE) The eGFR has been calculated using the CKD EPI equation. This calculation has not been validated in all clinical situations. eGFR's persistently <60 mL/min signify possible Chronic Kidney Disease.    Anion gap 8 5 - 15  Ethanol     Status: None   Collection Time: 08/06/16  1:39 PM  Result Value Ref Range   Alcohol, Ethyl (B) <5 <5 mg/dL    Comment:        LOWEST DETECTABLE LIMIT FOR SERUM ALCOHOL IS 5 mg/dL FOR MEDICAL PURPOSES ONLY   Salicylate level     Status: None   Collection Time: 08/06/16  1:39 PM  Result Value Ref Range   Salicylate Lvl <7.5 2.8 - 30.0 mg/dL  Acetaminophen level     Status: Abnormal   Collection Time: 08/06/16  1:39 PM  Result Value Ref Range   Acetaminophen (Tylenol), Serum <10 (L) 10 - 30 ug/mL    Comment:        THERAPEUTIC CONCENTRATIONS VARY SIGNIFICANTLY. A RANGE OF 10-30 ug/mL MAY BE AN EFFECTIVE CONCENTRATION FOR MANY PATIENTS. HOWEVER, SOME ARE BEST TREATED AT CONCENTRATIONS OUTSIDE THIS RANGE. ACETAMINOPHEN CONCENTRATIONS >150 ug/mL AT 4 HOURS AFTER INGESTION AND >50 ug/mL AT 12 HOURS AFTER INGESTION ARE OFTEN ASSOCIATED WITH TOXIC REACTIONS.   cbc     Status: Abnormal   Collection Time: 08/06/16  1:39 PM  Result Value Ref Range   WBC 10.8 (H) 4.0 - 10.5 K/uL   RBC 4.38 3.87 - 5.11 MIL/uL   Hemoglobin 11.3 (L) 12.0 - 15.0 g/dL   HCT 36.4 36.0 - 46.0 %   MCV 83.1 78.0 - 100.0 fL   MCH 25.8 (L) 26.0 - 34.0 pg   MCHC 31.0 30.0 - 36.0 g/dL   RDW 15.5 11.5 - 15.5 %   Platelets 388 150 - 400 K/uL   Lab Results  Component Value Date   HGBA1C 5.5 06/08/2015   MPG 123 (H) 02/05/2012   MPG 114 09/22/2011   Lab Results  Component Value Date   PROLACTIN 39.1 (H) 06/08/2015   Lab  Results  Component Value Date   CHOL 218 (H) 06/08/2015   TRIG 215 (H) 06/08/2015   HDL 36 (L) 06/08/2015   CHOLHDL 6.1 06/08/2015   VLDL 43 (H) 06/08/2015   LDLCALC 139 (H) 06/08/2015   LDLCALC UNABLE TO CALCULATE IF TRIGLYCERIDE OVER 400 mg/dL 09/22/2011     Current Medications: Current Outpatient Prescriptions  Medication Sig Dispense Refill  . aspirin EC 81 MG tablet Take 1 tablet (81 mg total) by mouth daily with breakfast. 30 tablet 0  . cyanocobalamin 1000 MCG tablet Take 1 tablet (1,000 mcg total) by mouth daily. 30 tablet 0  . cyclobenzaprine (FLEXERIL) 10 MG tablet Take 10 mg by mouth 2 (two) times daily.  6  . eletriptan (RELPAX) 40 MG tablet TAKE 1 TAB AT ONSET OF HEADACHES. MAY REPEAT IN 2 HOURS.MAX 2/24 HRS. FOR 30 DAYS  6  . folic acid (FOLVITE) 1 MG tablet Take 1 tablet (1 mg total) by mouth daily. 30 tablet 0  . HYDROcodone-acetaminophen (NORCO/VICODIN) 5-325 MG tablet Take 1 tablet by mouth every 6 (six) hours as needed for pain.    Marland Kitchen ibuprofen (ADVIL,MOTRIN) 800 MG tablet Take 800 mg by mouth every 8 (eight) hours as needed (pain).    Marland Kitchen levothyroxine (SYNTHROID, LEVOTHROID) 75 MCG tablet Take 1 tablet (75 mcg total) by mouth daily before breakfast. 30  tablet 0  . LORazepam (ATIVAN) 1 MG tablet Take 1/2 to 1 tab as needed for anxiety 60 tablet 0  . paliperidone (INVEGA) 9 MG 24 hr tablet Take 1 tablet (9 mg total) by mouth at bedtime. 30 tablet 0  . PARoxetine (PAXIL-CR) 37.5 MG 24 hr tablet Take 1 tablet (37.5 mg total) by mouth every morning. 30 tablet 0  . pravastatin (PRAVACHOL) 20 MG tablet Take 20 mg by mouth at bedtime.     . prazosin (MINIPRESS) 1 MG capsule Take 1 capsule (1 mg total) by mouth at bedtime. 30 capsule 0  . tiZANidine (ZANAFLEX) 4 MG tablet Take 4-8 mg by mouth at bedtime.   6  . topiramate (TOPAMAX) 50 MG tablet Take 100 mg by mouth 2 (two) times daily.    . Vitamin D, Ergocalciferol, (DRISDOL) 50000 units CAPS capsule Take 50,000 Units by mouth  every 7 (seven) days. saturday     No current facility-administered medications for this visit.     Neurologic: Headache: No Seizure: No Paresthesias: No  Musculoskeletal: Strength & Muscle Tone: within normal limits Gait & Station: unsteady Patient leans: Front and Backward  Psychiatric Specialty Exam: Review of Systems  Constitutional: Negative.   HENT: Negative.   Musculoskeletal: Positive for joint pain.  Skin: Negative.   Neurological: Negative for tremors.  Psychiatric/Behavioral: Positive for hallucinations. The patient is nervous/anxious and has insomnia.     Blood pressure 131/75, pulse 90, height _0  (1.803 m), weight 252 lb (114.3 kg).Body mass index is 35.15 kg/m.  General Appearance: Disheveled  Eye Contact:  Poor  Speech:  Rambling and incoherent sometimes.  Volume:  Normal  Mood:  Emotional  Affect:  Labile  Thought Process:  Descriptions of Associations: Circumstantial  Orientation:  Full (Time, Place, and Person)  Thought Content: Hallucinations: Auditory, Paranoid Ideation and Rumination   Suicidal Thoughts:  No  Homicidal Thoughts:  No  Memory:  Immediate;   Fair Recent;   Fair Remote;   Fair  Judgement:  Good  Insight:  Good  Psychomotor Activity:  Restlessness  Concentration:  Concentration: Fair and Attention Span: Fair  Recall:  [employer] of Knowledge: Fair  Language: Fair  Akathisia:  No  Handed:  Right  AIMS (if indicated):  0  Assets:  Desire for Improvement Housing Social Support  ADL's:  Intact  Cognition: Impaired,  Mild  Sleep:  Poor     Assessment: Schizophrenia chronic paranoid type.  Posttraumatic stress disorder.  Anxiety disorder NOS.  Panic attacks.  Plan: I review records from emergency room and discharge medication from old [facility].  We do not have discharge summary but patient brought medication bottles.  As per patient and her husband patient continues to have flashback, anxiety and nightmares.  She  like to go back on Minipress.  She is taking propranolol for tachycardia .  I will start Minipress 1 mg at bedtime for nightmares however if patient started to have dizziness or postural hypotension then she should call us immediately.  I will also discontinue imipramine since it is not working.  I will start Ativan 1 mg half to one tablet twice a day as needed for anxiety.  Continue Paxil 37.5 mg daily .  Patient like to give more time to new medication Invega 9 mg.  Patient has no tremors or any shakes or any concerns from new medication.  Encouraged to see Tharon Aquas for coping skills.  Discuss safety plan that anytime having active suicidal thoughts  or homicidal thoughts then she need to call 911 or go to the local emergency room.  Follow-up in 3-4 weeks.  Loyce Klasen T., MD 08/29/2016, 5:08 PM

## 2016-09-03 ENCOUNTER — Ambulatory Visit (HOSPITAL_COMMUNITY): Payer: Self-pay | Admitting: Clinical

## 2016-09-17 ENCOUNTER — Ambulatory Visit (INDEPENDENT_AMBULATORY_CARE_PROVIDER_SITE_OTHER): Payer: BLUE CROSS/BLUE SHIELD | Admitting: Clinical

## 2016-09-17 ENCOUNTER — Other Ambulatory Visit (HOSPITAL_COMMUNITY): Payer: Self-pay

## 2016-09-17 DIAGNOSIS — F200 Paranoid schizophrenia: Secondary | ICD-10-CM

## 2016-09-17 DIAGNOSIS — F4310 Post-traumatic stress disorder, unspecified: Secondary | ICD-10-CM | POA: Diagnosis not present

## 2016-09-17 MED ORDER — PROPRANOLOL HCL 20 MG PO TABS: 20.0000 mg | ORAL_TABLET | Freq: Three times a day (TID) | ORAL | 0 refills | Status: DC

## 2016-09-17 NOTE — Progress Notes (Signed)
   THERAPIST PROGRESS NOTE  Session Time: 4:27 - 5:25   Participation Level: Active  Behavioral Response: CasualAlertAnxious and Depressed  Type of Therapy: Individual Therapy  Treatment Goals addressed: Improve Psychiatric Symptoms, Emotional Regulation Skills, Healthy Coping Skills, reduced irrational worries and fears  Interventions: Motivational Interviewing,   Summary: Danielle [name] is a 46 y.o. female who presents with Schizophrenia and PTSD.   Suicidal/Homicidal: No - without intent/plan  Therapist Response:  Dorraine met with clinician for an individual session. Danielle [name]'s husband joined the session to support her. Danielle [name] discussed her psychiatric symptoms, her current life events. Danielle [name] shared that she has been struggling. Clinician asked open ended questions and Danielle [name] shared that she was having difficulty with her symptoms. She shared that she did not feel like her medication was working as well as she would like. Clinician suggested discussing her medication with her psychiatrist. She shared that she was also struggling because of struggles her children are having. Clinician asked open ended questions and Danielle [name] shared about some boundaries her and her husband had to set. She shared her conflicting emotions. Clinician asked open ended questions and Danielle [name] explored her thoughts and emotions. Client and clinician discussed healthy coping skills and emotional regulation skills.    Plan: Return again in 2 weeks.  Diagnosis:     Axis I: Schizophrenia, paranoid type and PTSD   Orlan Aversa A, LCSW 09/17/2016

## 2016-09-19 ENCOUNTER — Other Ambulatory Visit (HOSPITAL_COMMUNITY): Payer: Self-pay

## 2016-09-19 ENCOUNTER — Ambulatory Visit (HOSPITAL_COMMUNITY): Payer: Self-pay | Admitting: Psychiatry

## 2016-09-19 MED ORDER — PROPRANOLOL HCL 20 MG PO TABS: 20.0000 mg | ORAL_TABLET | Freq: Two times a day (BID) | ORAL | 0 refills | Status: DC

## 2016-09-20 ENCOUNTER — Other Ambulatory Visit (HOSPITAL_COMMUNITY): Payer: Self-pay

## 2016-09-20 ENCOUNTER — Other Ambulatory Visit (HOSPITAL_COMMUNITY): Payer: Self-pay | Admitting: Psychiatry

## 2016-09-20 DIAGNOSIS — F200 Paranoid schizophrenia: Secondary | ICD-10-CM

## 2016-09-20 DIAGNOSIS — F4310 Post-traumatic stress disorder, unspecified: Secondary | ICD-10-CM

## 2016-09-20 MED ORDER — PRAZOSIN HCL 1 MG PO CAPS: 1.0000 mg | ORAL_CAPSULE | Freq: Every day | ORAL | 0 refills | Status: DC

## 2016-09-20 MED ORDER — PALIPERIDONE ER 9 MG PO TB24: 9.0000 mg | ORAL_TABLET | Freq: Every day | ORAL | 0 refills | Status: DC

## 2016-09-22 ENCOUNTER — Encounter (HOSPITAL_COMMUNITY): Payer: Self-pay | Admitting: Clinical

## 2016-09-26 ENCOUNTER — Encounter (HOSPITAL_COMMUNITY): Payer: Self-pay | Admitting: Psychiatry

## 2016-09-26 ENCOUNTER — Ambulatory Visit (INDEPENDENT_AMBULATORY_CARE_PROVIDER_SITE_OTHER): Payer: BLUE CROSS/BLUE SHIELD | Admitting: Psychiatry

## 2016-09-26 VITALS — BP 118/70 | HR 70 | Ht 71.0 in | Wt 246.0 lb

## 2016-09-26 DIAGNOSIS — F200 Paranoid schizophrenia: Secondary | ICD-10-CM

## 2016-09-26 DIAGNOSIS — Z87891 Personal history of nicotine dependence: Secondary | ICD-10-CM

## 2016-09-26 DIAGNOSIS — F419 Anxiety disorder, unspecified: Secondary | ICD-10-CM | POA: Diagnosis not present

## 2016-09-26 DIAGNOSIS — F4310 Post-traumatic stress disorder, unspecified: Secondary | ICD-10-CM | POA: Diagnosis not present

## 2016-09-26 MED ORDER — PROPRANOLOL HCL 20 MG PO TABS: 20.0000 mg | ORAL_TABLET | Freq: Two times a day (BID) | ORAL | 1 refills | Status: DC

## 2016-09-26 MED ORDER — PAROXETINE HCL ER 37.5 MG PO TB24: 37.5000 mg | ORAL_TABLET | Freq: Every morning | ORAL | 1 refills | Status: DC

## 2016-09-26 MED ORDER — PRAZOSIN HCL 2 MG PO CAPS: 2.0000 mg | ORAL_CAPSULE | Freq: Every day | ORAL | 1 refills | Status: DC

## 2016-09-26 MED ORDER — LORAZEPAM 1 MG PO TABS: 1.0000 mg | ORAL_TABLET | Freq: Three times a day (TID) | ORAL | 1 refills | Status: DC | PRN

## 2016-09-26 MED ORDER — PALIPERIDONE ER 9 MG PO TB24: 9.0000 mg | ORAL_TABLET | Freq: Every day | ORAL | 1 refills | Status: DC

## 2016-09-26 NOTE — Progress Notes (Signed)
BH MD/PA/NP OP Progress Note  09/26/2016 4:39 PM Danielle [name]  MRN:  [id number]  Chief Complaint:  Chief Complaint    Follow-up     Subjective:  I'm doing better but I like to increase my Minipress.  HPI: Patient came for her follow-up appointment with her husband.  She is feeling much better since started Minipress and Ativan.  Her nightmares are less intense and less frequent.  She is sleeping better.  She denies any irritability or any crying spells and her paranoia is less intense from the past.  She still feel nervous and anxious in the day.  She is seeing [place]  She is wondering if Minipress can increase further since she is tolerating the medication and she denies any dizziness, fall, palpitation or any excessive sedation.  She is also taking Inderal for tachycardia.  Patient has no tremors, shakes or any postural hypotension.  Her appetite is okay.  Her vital signs are stable.  Today she is actually pleasant and her affect is improved from the past  Visit Diagnosis:    ICD-9-CM ICD-10-CM   1. PTSD (post-traumatic stress disorder) 309.81 F43.10 propranolol (INDERAL) 20 MG tablet     prazosin (MINIPRESS) 2 MG capsule     PARoxetine (PAXIL-CR) 37.5 MG 24 hr tablet     paliperidone (INVEGA) 9 MG 24 hr tablet     LORazepam (ATIVAN) 1 MG tablet  2. Schizophrenia, paranoid type (H[facility]) 295.30 F20.0 prazosin (MINIPRESS) 2 MG capsule     paliperidone (INVEGA) 9 MG 24 hr tablet    Past Psychiatric History: Reviewed. Patient has multiple hospitalization do to psychosis, paranoia, hallucination, suicidal attempt, extreme panic attack and severe depression.  Her last admission was in April 2018 at old [facility].  She has at least 4 suicidal attempt.  She has history of cutting herself and drowning herself.  In the past she has tried multiple psychiatric medication with limited response.  She has taken Zoloft, amitriptyline, Wellbutrin, Latuda, Xanax, Valium, Risperdal, lithium, Haldol,  Depakote, Zyprexa, Geodon and recently Lamictal.  At her recent hospitalization at old [place], she was started on Paxil, temazepam, imipramine and Invega.   Past Medical History:  Past Medical History:  Diagnosis Date  . Anemia   . Anxiety   . Bipolar 1 disorder ([facility])   . Depressed   . Headache(784.0)   . Heart murmur   . Hypothyroidism   . PTSD (post-traumatic stress disorder)   . Schizophrenia ([facility])   . Seizures ([facility])   . Shortness of breath     Past Surgical History:  Procedure Laterality Date  . FOOT SURGERY    . TUBAL LIGATION  1996    Family Psychiatric History: Reviewed.  Family History:  Family History  Problem Relation Age of Onset  . Adopted: Yes    Social History:  Social History   Social History  . Marital status: Married    Spouse name: N/A  . Number of children: 2  . Years of education: N/A   Occupational History  . unemployed Not Employed   Social History Main Topics  . Smoking status: Former Smoker    Packs/day: 1.50    Years: 30.00    Types: Cigarettes    Quit date: 09/20/2011  . Smokeless tobacco: Never Used  . Alcohol use No  . Drug use: No  . Sexual activity: Yes    Partners: Male    Birth control/ protection: Surgical   Other Topics Concern  . Not  on file   Social History Narrative   Pt is adopted. Unsure of family history.    Allergies:  Allergies  Allergen Reactions  . Tomato Rash    Pt reports that she breaks out in a rash if she eats tomato based foods but can eat a whole tomato  . Imitrex [Sumatriptan Base] Hives  . Mustard Seed Rash    [facility]    Metabolic Disorder Labs: Lab Results  Component Value Date   HGBA1C 5.5 06/08/2015   MPG 123 (H) 02/05/2012   MPG 114 09/22/2011   Lab Results  Component Value Date   PROLACTIN 39.1 (H) 06/08/2015   Lab Results  Component Value Date   CHOL 218 (H) 06/08/2015   TRIG 215 (H) 06/08/2015   HDL 36 (L) 06/08/2015   CHOLHDL 6.1 06/08/2015   VLDL 43  (H) 06/08/2015   LDLCALC 139 (H) 06/08/2015   LDLCALC UNABLE TO CALCULATE IF TRIGLYCERIDE OVER 400 mg/dL 16/10/960405/19/2013     Current Medications: Current Outpatient Prescriptions  Medication Sig Dispense Refill  . aspirin EC 81 MG tablet Take 1 tablet (81 mg total) by mouth daily with breakfast. 30 tablet 0  . eletriptan (RELPAX) 40 MG tablet TAKE 1 TAB AT ONSET OF HEADACHES. MAY REPEAT IN 2 HOURS.MAX 2/24 HRS. FOR 30 DAYS  6  . folic acid (FOLVITE) 1 MG tablet Take 1 tablet (1 mg total) by mouth daily. 30 tablet 0  . ibuprofen (ADVIL,MOTRIN) 800 MG tablet Take 800 mg by mouth every 8 (eight) hours as needed (pain).    Marland Kitchen. levothyroxine (SYNTHROID, LEVOTHROID) 75 MCG tablet Take 1 tablet (75 mcg total) by mouth daily before breakfast. 30 tablet 0  . LORazepam (ATIVAN) 1 MG tablet Take 1/2 to 1 tab as needed for anxiety 60 tablet 0  . paliperidone (INVEGA) 9 MG 24 hr tablet Take 1 tablet (9 mg total) by mouth at bedtime. 30 tablet 0  . PARoxetine (PAXIL-CR) 37.5 MG 24 hr tablet Take 1 tablet (37.5 mg total) by mouth every morning. 30 tablet 0  . pravastatin (PRAVACHOL) 20 MG tablet Take 20 mg by mouth at bedtime.     . prazosin (MINIPRESS) 1 MG capsule Take 1 capsule (1 mg total) by mouth at bedtime. 30 capsule 0  . propranolol (INDERAL) 20 MG tablet Take 1 tablet (20 mg total) by mouth 2 (two) times daily. 60 tablet 0  . tiZANidine (ZANAFLEX) 4 MG tablet Take 4-8 mg by mouth at bedtime.   6  . topiramate (TOPAMAX) 50 MG tablet Take 100 mg by mouth 2 (two) times daily.    . vitamin B-12 (CYANOCOBALAMIN) 1000 MCG tablet Take 1,000 mcg by mouth daily.    . Vitamin D, Ergocalciferol, (DRISDOL) 50000 units CAPS capsule Take 50,000 Units by mouth every 7 (seven) days. saturday     No current facility-administered medications for this visit.     Neurologic: Headache: No Seizure: No Paresthesias: No  Musculoskeletal: Strength & Muscle Tone: within normal limits Gait & Station: normal Patient  leans: N/A  Psychiatric Specialty Exam: ROS  Blood pressure 118/70, pulse 70, height 5\' 11"  (1.803 m), weight 246 lb (111.6 kg).Body mass index is 34.31 kg/m.  General Appearance: Casual  Eye Contact:  Good  Speech:  rambling  Volume:  Normal  Mood:  Anxious  Affect:  Congruent  Thought Process:  Goal Directed  Orientation:  Full (Time, Place, and Person)  Thought Content: Rumination   Suicidal Thoughts:  No  Homicidal Thoughts:  No  Memory:  Immediate;   Good Recent;   Fair Remote;   Fair  Judgement:  Fair  Insight:  Good  Psychomotor Activity:  Normal  Concentration:  Concentration: Fair and Attention Span: Fair  Recall:  [employer] of Knowledge: Fair  Language: Fair  Akathisia:  No  Handed:  Right  AIMS (if indicated):  0  Assets:  Desire for Improvement Housing Social Support  ADL's:  Intact  Cognition: WNL  Sleep:  ok   Assessment: Schizophrenia chronic paranoid type.  Posttraumatic stress disorder.  Anxiety disorder NOS.  Plan: Patient is doing much better from the last visit.  Her husband also endorsed improvement in her mood, paranoia, nightmares and hallucinations.  I will continue Paxil 37.5 mg daily, and vague 9 mg daily, Inderal 20 mg twice a day.  I would increase Minipress 2 mg and Ativan 1 mg up to 3 times a day as needed.  Reminded again for dizziness and postural hypotension.  Patient's husband is very supportive and gets blood pressure frequently before and after giving the medication.  Patient will continue to see Tomma Lightning.  Discuss safety plan that anytime having active suicidal thoughts or homicidal thoughts then she need to call 911 or go to the local emergency room.  Follow-up in 2 months.  ARFEEN,SYED T., MD 09/26/2016, 4:39 PM

## 2016-10-01 ENCOUNTER — Telehealth (HOSPITAL_COMMUNITY): Payer: Self-pay

## 2016-10-01 ENCOUNTER — Other Ambulatory Visit (HOSPITAL_COMMUNITY): Payer: Self-pay

## 2016-10-01 NOTE — Telephone Encounter (Signed)
No further increase in lorazepam.  She is taking 1 mg 3 times a day.

## 2016-10-01 NOTE — Telephone Encounter (Signed)
Patient is calling and she is asking if her Lorazepam is supposed to be increased. I told patient that the current dose in 1 mg tid prn, but patient says she thought you were going to increase the milligrams. I told patient I would speak to you and call her back. Please review and advise, thank you

## 2016-10-02 NOTE — Telephone Encounter (Signed)
I called patients husband and he said he would relay the information to Kariya. He said that he had told her it was right, but she needed verification.

## 2016-10-03 ENCOUNTER — Encounter (HOSPITAL_COMMUNITY): Payer: Self-pay | Admitting: Clinical

## 2016-10-03 ENCOUNTER — Ambulatory Visit (INDEPENDENT_AMBULATORY_CARE_PROVIDER_SITE_OTHER): Payer: BLUE CROSS/BLUE SHIELD | Admitting: Clinical

## 2016-10-03 ENCOUNTER — Emergency Department (HOSPITAL_COMMUNITY)
Admission: EM | Admit: 2016-10-03 | Discharge: 2016-10-03 | Disposition: A | Discharge status: Home / Self Care | Payer: BLUE CROSS/BLUE SHIELD | Attending: Emergency Medicine | Admitting: Emergency Medicine

## 2016-10-03 ENCOUNTER — Encounter (HOSPITAL_COMMUNITY): Payer: Self-pay

## 2016-10-03 DIAGNOSIS — R1032 Left lower quadrant pain: Secondary | ICD-10-CM

## 2016-10-03 DIAGNOSIS — K5792 Diverticulitis of intestine, part unspecified, without perforation or abscess without bleeding: Secondary | ICD-10-CM

## 2016-10-03 DIAGNOSIS — Z79899 Other long term (current) drug therapy: Secondary | ICD-10-CM | POA: Insufficient documentation

## 2016-10-03 DIAGNOSIS — Z87891 Personal history of nicotine dependence: Secondary | ICD-10-CM | POA: Diagnosis not present

## 2016-10-03 DIAGNOSIS — F4310 Post-traumatic stress disorder, unspecified: Secondary | ICD-10-CM

## 2016-10-03 DIAGNOSIS — F200 Paranoid schizophrenia: Secondary | ICD-10-CM

## 2016-10-03 DIAGNOSIS — K5790 Diverticulosis of intestine, part unspecified, without perforation or abscess without bleeding: Secondary | ICD-10-CM | POA: Diagnosis not present

## 2016-10-03 DIAGNOSIS — E039 Hypothyroidism, unspecified: Secondary | ICD-10-CM | POA: Insufficient documentation

## 2016-10-03 DIAGNOSIS — Z7982 Long term (current) use of aspirin: Secondary | ICD-10-CM | POA: Diagnosis not present

## 2016-10-03 LAB — COMPREHENSIVE METABOLIC PANEL
ALK PHOS: 84 U/L (ref 38–126)
ALT: 11 U/L — ABNORMAL LOW (ref 14–54)
AST: 14 U/L — AB (ref 15–41)
Albumin: 3.9 g/dL (ref 3.5–5.0)
Anion gap: 9 (ref 5–15)
BILIRUBIN TOTAL: 0.4 mg/dL (ref 0.3–1.2)
BUN: 13 mg/dL (ref 6–20)
CO2: 21 mmol/L — ABNORMAL LOW (ref 22–32)
Calcium: 9.4 mg/dL (ref 8.9–10.3)
Chloride: 107 mmol/L (ref 101–111)
Creatinine, Ser: 0.94 mg/dL (ref 0.44–1.00)
GFR calc Af Amer: >60 mL/min (ref >60)
Glucose, Bld: 106 mg/dL — ABNORMAL HIGH (ref 65–99)
Potassium: 4 mmol/L (ref 3.5–5.1)
Sodium: 137 mmol/L (ref 135–145)
TOTAL PROTEIN: 7.4 g/dL (ref 6.5–8.1)

## 2016-10-03 LAB — CBC
HCT: 38.6 % (ref 36.0–46.0)
Hemoglobin: 12.2 g/dL (ref 12.0–15.0)
MCH: 26 pg (ref 26.0–34.0)
MCHC: 31.6 g/dL (ref 30.0–36.0)
MCV: 82.1 fL (ref 78.0–100.0)
PLATELETS: 313 10*3/uL (ref 150–400)
RBC: 4.7 MIL/uL (ref 3.87–5.11)
RDW: 16.5 % — AB (ref 11.5–15.5)
WBC: 9.6 10*3/uL (ref 4.0–10.5)

## 2016-10-03 LAB — URINALYSIS, ROUTINE W REFLEX MICROSCOPIC
BILIRUBIN URINE: NEGATIVE
Glucose, UA: NEGATIVE mg/dL
Ketones, ur: NEGATIVE mg/dL
NITRITE: NEGATIVE
PH: 5 (ref 5.0–8.0)
Protein, ur: 30 mg/dL — AB
SPECIFIC GRAVITY, URINE: 1.013 (ref 1.005–1.030)

## 2016-10-03 LAB — I-STAT BETA HCG BLOOD, ED (MC, WL, AP ONLY): I-stat hCG, quantitative: <5 m[IU]/mL (ref <5)

## 2016-10-03 LAB — LIPASE, BLOOD: Lipase: 23 U/L (ref 11–51)

## 2016-10-03 MED ORDER — METRONIDAZOLE 500 MG PO TABS: 500.0000 mg | ORAL_TABLET | Freq: Two times a day (BID) | ORAL | 0 refills | Status: DC

## 2016-10-03 MED ORDER — ONDANSETRON 4 MG PO TBDP: 4.0000 mg | ORAL_TABLET | Freq: Three times a day (TID) | ORAL | 0 refills | Status: DC | PRN

## 2016-10-03 MED ORDER — ACETAMINOPHEN ER 650 MG PO TBCR: 650.0000 mg | EXTENDED_RELEASE_TABLET | Freq: Three times a day (TID) | ORAL | 0 refills | Status: DC | PRN

## 2016-10-03 MED ORDER — ONDANSETRON 4 MG PO TBDP
4.0000 mg | ORAL_TABLET | Freq: Once | ORAL | Status: AC
  Administered 2016-10-03: 4 mg via ORAL
  Filled 2016-10-03: qty 1

## 2016-10-03 MED ORDER — CIPROFLOXACIN HCL 500 MG PO TABS: 500.0000 mg | ORAL_TABLET | Freq: Two times a day (BID) | ORAL | 0 refills | Status: DC

## 2016-10-03 MED ORDER — OXYCODONE-ACETAMINOPHEN 5-325 MG PO TABS
1.0000 | ORAL_TABLET | Freq: Once | ORAL | Status: AC
  Administered 2016-10-03: 1 via ORAL
  Filled 2016-10-03: qty 1

## 2016-10-03 NOTE — ED Provider Notes (Signed)
[facility]-EMERGENCY DEPT Provider Note   CSN: [id number] Arrival date & time: 10/03/16  1638     History   Chief Complaint Chief Complaint  Patient presents with  . Abdominal Pain    HPI Danielle [name] is a 46 y.o. female.  HPI Pt comes in with cc of abd pain. Pt has hx of hypothyroidism and psychiatric conditions. Pt comes in with LLQ abd pain x 3 days. Pt has noted blood in the urine. Pt has no hx of renal stones. Pt reports that her pain is constant in nature. There is no frequent urination / urinary urgency / burning with urination - and pt reports that she has had UTI in the past which didn't feel the same way. PT also denies vaginal discharge or high risk sexual activity. Pt's pain is in the LLQ region and is not radiating. She has had nausea, no emesis today and she denies any bloody stool or diarrhea.  Past Medical History:  Diagnosis Date  . Anemia   . Anxiety   . Bipolar 1 disorder (HCC)   . Depressed   . Headache(784.0)   . Heart murmur   . Hypothyroidism   . PTSD (post-traumatic stress disorder)   . Schizophrenia (HCC)   . Seizures (HCC)   . Shortness of breath     Patient Active Problem List   Diagnosis Date Noted  . Frequent falls 06/18/2015  . Tobacco use disorder 06/08/2015  . Hyperammonemia (HCC) 06/08/2015  . Paranoid schizophrenia (HCC)   . Migraine headache 06/04/2012  . Seizure (HCC) 06/04/2012  . Hypothyroidism 06/04/2012  . OSA (obstructive sleep apnea) 08/29/2011  . Post traumatic stress disorder (PTSD) 07/31/2011    Past Surgical History:  Procedure Laterality Date  . FOOT SURGERY    . LEG SURGERY    . NASAL FRACTURE SURGERY    . TUBAL LIGATION  1996    OB History    No data available       Home Medications    Prior to Admission medications   Medication Sig Start Date End Date Taking? Authorizing Provider  acetaminophen (TYLENOL 8 HOUR) 650 MG CR tablet Take 1 tablet (650 mg total) by mouth every 8 (eight) hours as needed for  pain. 10/03/16   Derwood KaplanNanavati, Willetta York, MD  aspirin EC 81 MG tablet Take 1 tablet (81 mg total) by mouth daily with breakfast. 06/16/15   Pucilowska, Jolanta B, MD  ciprofloxacin (CIPRO) 500 MG tablet Take 1 tablet (500 mg total) by mouth every 12 (twelve) hours. 10/03/16   Derwood KaplanNanavati, Cailean Heacock, MD  eletriptan (RELPAX) 40 MG tablet TAKE 1 TAB AT ONSET OF HEADACHES. MAY REPEAT IN 2 HOURS.MAX 2/24 HRS. FOR 30 DAYS 07/22/16   [provider]  folic acid (FOLVITE) 1 MG tablet Take 1 tablet (1 mg total) by mouth daily. 06/16/15   Pucilowska, Jolanta B, MD  ibuprofen (ADVIL,MOTRIN) 800 MG tablet Take 800 mg by mouth every 8 (eight) hours as needed (pain).    [provider]  levothyroxine (SYNTHROID, LEVOTHROID) 75 MCG tablet Take 1 tablet (75 mcg total) by mouth daily before breakfast. 06/16/15   Pucilowska, Jolanta B, MD  LORazepam (ATIVAN) 1 MG tablet Take 1 tablet (1 mg total) by mouth 3 (three) times daily as needed for anxiety. 09/26/16   Arfeen, Phillips GroutSyed T, MD  metroNIDAZOLE (FLAGYL) 500 MG tablet Take 1 tablet (500 mg total) by mouth 2 (two) times daily. 10/03/16   Derwood KaplanNanavati, Lunette Tapp, MD  ondansetron (ZOFRAN ODT) 4 MG  disintegrating tablet Take 1 tablet (4 mg total) by mouth every 8 (eight) hours as needed for nausea or vomiting. 10/03/16   Derwood Kaplan, MD  paliperidone (INVEGA) 9 MG 24 hr tablet Take 1 tablet (9 mg total) by mouth at bedtime. 09/26/16   Arfeen, Phillips Grout, MD  PARoxetine (PAXIL-CR) 37.5 MG 24 hr tablet Take 1 tablet (37.5 mg total) by mouth every morning. 09/26/16   Arfeen, Phillips Grout, MD  pravastatin (PRAVACHOL) 20 MG tablet Take 20 mg by mouth at bedtime.  02/26/16 02/25/17  [provider]  prazosin (MINIPRESS) 2 MG capsule Take 1 capsule (2 mg total) by mouth at bedtime. 09/26/16   Arfeen, Phillips Grout, MD  propranolol (INDERAL) 20 MG tablet Take 1 tablet (20 mg total) by mouth 2 (two) times daily. 09/26/16   Arfeen, Phillips Grout, MD  tiZANidine (ZANAFLEX) 4 MG tablet Take 4-8 mg by mouth at  bedtime.  07/10/16   [provider]  topiramate (TOPAMAX) 50 MG tablet Take 100 mg by mouth 2 (two) times daily.    [provider]  vitamin B-12 (CYANOCOBALAMIN) 1000 MCG tablet Take 1,000 mcg by mouth daily. 09/05/16 09/05/17  [provider]  Vitamin D, Ergocalciferol, (DRISDOL) 50000 units CAPS capsule Take 50,000 Units by mouth every 7 (seven) days. saturday    [provider]    Family History Family History  Problem Relation Age of Onset  . Adopted: Yes    Social History Social History  Substance Use Topics  . Smoking status: Former Smoker    Packs/day: 1.50    Years: 30.00    Types: Cigarettes    Quit date: 09/20/2011  . Smokeless tobacco: Never Used  . Alcohol use No     Allergies   Tomato; Imitrex [sumatriptan base]; and Mustard seed   Review of Systems Review of Systems  Constitutional: Positive for activity change.  Gastrointestinal: Positive for abdominal pain and nausea.  Genitourinary: Positive for hematuria. Negative for dysuria.  All other systems reviewed and are negative.    Physical Exam Updated Vital Signs BP 119/81 (BP Location: Right Arm)   Pulse 64   Temp 98.8 F (37.1 C) (Oral)   Resp 19   Ht 5\' 11"  (1.803 m)   Wt 112.5 kg (248 lb)   SpO2 95%   BMI 34.59 kg/m   Physical Exam  Constitutional: She is oriented to person, place, and time. She appears well-developed.  HENT:  Head: Normocephalic and atraumatic.  Eyes: EOM are normal.  Neck: Normal range of motion. Neck supple.  Cardiovascular: Normal rate.   Pulmonary/Chest: Effort normal.  Abdominal: Soft. Bowel sounds are normal. She exhibits no distension and no mass. There is tenderness. There is guarding. There is no rebound. No hernia.  LLQ tenderness, focal, guarding with deep palpation. No rebound  Genitourinary:  Genitourinary Comments: No flank tenderness  Neurological: She is alert and oriented to person, place, and time.  Skin: Skin is warm  and dry.  Nursing note and vitals reviewed.    ED Treatments / Results  Labs (all labs ordered are listed, but only abnormal results are displayed) Labs Reviewed  COMPREHENSIVE METABOLIC PANEL - Abnormal; Notable for the following:       Result Value   CO2 21 (*)    Glucose, Bld 106 (*)    AST 14 (*)    ALT 11 (*)    All other components within normal limits  CBC - Abnormal; Notable for the following:  RDW 16.5 (*)    All other components within normal limits  URINALYSIS, ROUTINE W REFLEX MICROSCOPIC - Abnormal; Notable for the following:    APPearance CLOUDY (*)    Hgb urine dipstick LARGE (*)    Protein, ur 30 (*)    Leukocytes, UA LARGE (*)    Bacteria, UA MANY (*)    Squamous Epithelial / LPF 0-5 (*)    All other components within normal limits  LIPASE, BLOOD  I-STAT BETA HCG BLOOD, ED ([facility], [facility], [facility] ONLY)    EKG  EKG Interpretation None       Radiology No results found.  Procedures Procedures (including critical care time)  Medications Ordered in ED Medications  oxyCODONE-acetaminophen (PERCOCET/ROXICET) 5-325 MG per tablet 1 tablet (1 tablet Oral Given 10/03/16 1909)  ondansetron (ZOFRAN-ODT) disintegrating tablet 4 mg (4 mg Oral Given 10/03/16 1909)     Initial Impression / Assessment and Plan / ED Course  I have reviewed the triage vital signs and the nursing notes.  Pertinent labs & imaging results that were available during my care of the patient were reviewed by me and considered in my medical decision making (see chart for details).     PT comes in with cc of LLQ tenderness. DDx includes:  Intra abdominal abscess Thrombosis Mesenteric ischemia Diverticulitis AAA Tumors Colitis Nephrolithiasis Pyelonephritis UTI/Cystitis  Ovarian cyst TOA Ectopic pregnancy PID STD   Pt comes in with cc of LLQ abd pain. Pt has hematuria, but she denies urinary urgency/frequency and she denies pain with urination. Additionally, she reports that the   Current symptoms are not consistent with her UTI that she has had in the past. Pt also denies any vaginal discharge or bleeding. The tenderness is focal in the LLQ, and the clinical concerns are high for diverticulitis.  Results from the ER visit, including the UA discussed with the patient.  Pt is not toxic appearing and she is immunocompetent. We discussed aggressive option of getting CT abdomen to confirma a diagnosis or rule it out - or be conservative and clinically treat the symptoms as diverticulitis, and pt can return to the ER if the symptoms get worse. Husband and pt decided with the latter. Cipro will cover for most uncomplicated UTIs as well.  Strict ER return precautions have been discussed, and patient is agreeing with the plan and is comfortable with the workup done and the recommendations from the ER.  F/u With PCP in 1 week advised.  Final Clinical Impressions(s) / ED Diagnoses   Final diagnoses:  LLQ pain  Diverticulitis    New Prescriptions New Prescriptions   ACETAMINOPHEN (TYLENOL 8 HOUR) 650 MG CR TABLET    Take 1 tablet (650 mg total) by mouth every 8 (eight) hours as needed for pain.   CIPROFLOXACIN (CIPRO) 500 MG TABLET    Take 1 tablet (500 mg total) by mouth every 12 (twelve) hours.   METRONIDAZOLE (FLAGYL) 500 MG TABLET    Take 1 tablet (500 mg total) by mouth 2 (two) times daily.   ONDANSETRON (ZOFRAN ODT) 4 MG DISINTEGRATING TABLET    Take 1 tablet (4 mg total) by mouth every 8 (eight) hours as needed for nausea or vomiting.     Derwood Kaplan, MD 10/03/16 2023

## 2016-10-03 NOTE — ED Triage Notes (Signed)
Patient c/o left lower abdominal pain since yesterday and intermittent N/V x 2 days. Patient also reports vaginal spotting.

## 2016-10-03 NOTE — Discharge Instructions (Signed)
At this time, we think you have diverticulitis. PLEASE READ THE INFORMATION ON DIVERTICULITIS. Take the antibiotics provided.  Please return to the ER if your symptoms worsen; you have increased pain in the abdomen, fevers, chills, inability to keep any medications down, bloody stools. Otherwise see the outpatient doctor as requested.

## 2016-10-03 NOTE — Progress Notes (Signed)
   THERAPIST PROGRESS NOTE  Session Time: 4:15 - 4:22   Summary: Danielle [name] is a 46 y.o. female who presents with schizophrenia and PTSD   Suicidal/Homicidal: Nowithout intent/plan  Therapist Response: Danielle [name] was in nurses office when clinician retrieved her. Nurse and Danielle [name] had been discussing some abdominal pain she was having. When Danielle [name] got up to walk to clinician's office it was obvious that the pain was greater than discussed.  Nurse and clinician discussed Danielle [name]'s pain and recommended that she go to the ED. Clinician walked Danielle [name] down to her car outside the front door where her ride was waiting to take her across the street.    Plan: Return again in 2 weeks.         Damani Kelemen A, LCSW 10/03/2016

## 2016-10-05 ENCOUNTER — Emergency Department (HOSPITAL_COMMUNITY): Payer: BLUE CROSS/BLUE SHIELD

## 2016-10-05 ENCOUNTER — Emergency Department (HOSPITAL_COMMUNITY)
Admission: EM | Admit: 2016-10-05 | Discharge: 2016-10-05 | Disposition: A | Discharge status: Home / Self Care | Payer: BLUE CROSS/BLUE SHIELD | Attending: Emergency Medicine | Admitting: Emergency Medicine

## 2016-10-05 ENCOUNTER — Encounter (HOSPITAL_COMMUNITY): Payer: Self-pay | Admitting: Emergency Medicine

## 2016-10-05 DIAGNOSIS — A09 Infectious gastroenteritis and colitis, unspecified: Secondary | ICD-10-CM | POA: Insufficient documentation

## 2016-10-05 DIAGNOSIS — R1032 Left lower quadrant pain: Secondary | ICD-10-CM | POA: Insufficient documentation

## 2016-10-05 DIAGNOSIS — Z87891 Personal history of nicotine dependence: Secondary | ICD-10-CM | POA: Diagnosis not present

## 2016-10-05 DIAGNOSIS — N6310 Unspecified lump in the right breast, unspecified quadrant: Secondary | ICD-10-CM | POA: Diagnosis not present

## 2016-10-05 DIAGNOSIS — E039 Hypothyroidism, unspecified: Secondary | ICD-10-CM | POA: Diagnosis not present

## 2016-10-05 DIAGNOSIS — R197 Diarrhea, unspecified: Secondary | ICD-10-CM

## 2016-10-05 DIAGNOSIS — Z7982 Long term (current) use of aspirin: Secondary | ICD-10-CM | POA: Diagnosis not present

## 2016-10-05 DIAGNOSIS — Z79899 Other long term (current) drug therapy: Secondary | ICD-10-CM | POA: Insufficient documentation

## 2016-10-05 DIAGNOSIS — W57XXXA Bitten or stung by nonvenomous insect and other nonvenomous arthropods, initial encounter: Secondary | ICD-10-CM

## 2016-10-05 DIAGNOSIS — Z87828 Personal history of other (healed) physical injury and trauma: Secondary | ICD-10-CM

## 2016-10-05 LAB — COMPREHENSIVE METABOLIC PANEL
ALT: 14 U/L (ref 14–54)
ANION GAP: 9 (ref 5–15)
AST: 19 U/L (ref 15–41)
Albumin: 3.7 g/dL (ref 3.5–5.0)
Alkaline Phosphatase: 89 U/L (ref 38–126)
BUN: 11 mg/dL (ref 6–20)
CO2: 19 mmol/L — AB (ref 22–32)
Calcium: 8.8 mg/dL — ABNORMAL LOW (ref 8.9–10.3)
Chloride: 108 mmol/L (ref 101–111)
Creatinine, Ser: 1.07 mg/dL — ABNORMAL HIGH (ref 0.44–1.00)
GFR calc non Af Amer: >60 mL/min (ref >60)
Glucose, Bld: 115 mg/dL — ABNORMAL HIGH (ref 65–99)
Potassium: 4.1 mmol/L (ref 3.5–5.1)
SODIUM: 136 mmol/L (ref 135–145)
Total Bilirubin: 0.5 mg/dL (ref 0.3–1.2)
Total Protein: 7.1 g/dL (ref 6.5–8.1)

## 2016-10-05 LAB — URINALYSIS, ROUTINE W REFLEX MICROSCOPIC
Bilirubin Urine: NEGATIVE
GLUCOSE, UA: NEGATIVE mg/dL
Ketones, ur: NEGATIVE mg/dL
Nitrite: NEGATIVE
PH: 5 (ref 5.0–8.0)
PROTEIN: NEGATIVE mg/dL
SPECIFIC GRAVITY, URINE: 1.008 (ref 1.005–1.030)

## 2016-10-05 LAB — CBC
HEMATOCRIT: 36.5 % (ref 36.0–46.0)
HEMOGLOBIN: 11.5 g/dL — AB (ref 12.0–15.0)
MCH: 26 pg (ref 26.0–34.0)
MCHC: 31.5 g/dL (ref 30.0–36.0)
MCV: 82.6 fL (ref 78.0–100.0)
Platelets: 270 10*3/uL (ref 150–400)
RBC: 4.42 MIL/uL (ref 3.87–5.11)
RDW: 16.3 % — ABNORMAL HIGH (ref 11.5–15.5)
WBC: 8.1 10*3/uL (ref 4.0–10.5)

## 2016-10-05 LAB — LIPASE, BLOOD: LIPASE: 25 U/L (ref 11–51)

## 2016-10-05 MED ORDER — IOPAMIDOL (ISOVUE-300) INJECTION 61%
INTRAVENOUS | Status: AC
  Filled 2016-10-05: qty 100

## 2016-10-05 MED ORDER — SODIUM CHLORIDE 0.9 % IV BOLUS (SEPSIS)
1000.0000 mL | Freq: Once | INTRAVENOUS | Status: AC
  Administered 2016-10-05: 1000 mL via INTRAVENOUS

## 2016-10-05 MED ORDER — IOPAMIDOL (ISOVUE-300) INJECTION 61%
100.0000 mL | Freq: Once | INTRAVENOUS | Status: AC | PRN
  Administered 2016-10-05: 100 mL via INTRAVENOUS

## 2016-10-05 NOTE — ED Provider Notes (Signed)
[facility]-EMERGENCY DEPT Provider Note   CSN: [id number] Arrival date & time: 10/05/16  0427     History   Chief Complaint Chief Complaint  Patient presents with  . Abdominal Pain    LLQ    HPI Danielle [name] is a 46 y.o. female.  46yo F w/ PMH including bipolar d/o, schizophrenia, seizures, migraines who p/w LLQ abdominal pain and diarrhea. She reports that the LLQ abdominal pain began 5 days ago. Pain is constant, non-radiating, and worse with movement. She has never had pain like this before or any hx of diverticulitis. She has had associated diarrhea that previously had some blood but this has stopped. She was seen on 5/31 and started on cipro/flagyl for presumed diverticulitis. She has been taking these medications with no improvement in her symptoms.  No fevers, vomiting, or cough/cold symptoms; has been taking medication for nausea. No urinary symptoms. She feels dehydrated because of decreased appetite.No sick contacts or recent travel.     Abdominal Pain      Past Medical History:  Diagnosis Date  . Anemia   . Anxiety   . Bipolar 1 disorder (HCC)   . Depressed   . Headache(784.0)   . Heart murmur   . Hypothyroidism   . PTSD (post-traumatic stress disorder)   . Schizophrenia (HCC)   . Seizures (HCC)   . Shortness of breath     Patient Active Problem List   Diagnosis Date Noted  . Frequent falls 06/18/2015  . Tobacco use disorder 06/08/2015  . Hyperammonemia (HCC) 06/08/2015  . Paranoid schizophrenia (HCC)   . Migraine headache 06/04/2012  . Seizure (HCC) 06/04/2012  . Hypothyroidism 06/04/2012  . OSA (obstructive sleep apnea) 08/29/2011  . Post traumatic stress disorder (PTSD) 07/31/2011    Past Surgical History:  Procedure Laterality Date  . FOOT SURGERY    . LEG SURGERY    . NASAL FRACTURE SURGERY    . TUBAL LIGATION  1996    OB History    No data available       Home Medications    Prior to Admission medications   Medication Sig Start  Date End Date Taking? Authorizing Provider  acetaminophen (TYLENOL 8 HOUR) 650 MG CR tablet Take 1 tablet (650 mg total) by mouth every 8 (eight) hours as needed for pain. 10/03/16   Derwood Kaplan, MD  aspirin EC 81 MG tablet Take 1 tablet (81 mg total) by mouth daily with breakfast. 06/16/15   Pucilowska, Jolanta B, MD  ciprofloxacin (CIPRO) 500 MG tablet Take 1 tablet (500 mg total) by mouth every 12 (twelve) hours. 10/03/16   Derwood Kaplan, MD  eletriptan (RELPAX) 40 MG tablet TAKE 1 TAB AT ONSET OF HEADACHES. MAY REPEAT IN 2 HOURS.MAX 2/24 HRS. FOR 30 DAYS 07/22/16   [provider]  folic acid (FOLVITE) 1 MG tablet Take 1 tablet (1 mg total) by mouth daily. 06/16/15   Pucilowska, Jolanta B, MD  ibuprofen (ADVIL,MOTRIN) 800 MG tablet Take 800 mg by mouth every 8 (eight) hours as needed (pain).    [provider]  levothyroxine (SYNTHROID, LEVOTHROID) 75 MCG tablet Take 1 tablet (75 mcg total) by mouth daily before breakfast. 06/16/15   Pucilowska, Jolanta B, MD  LORazepam (ATIVAN) 1 MG tablet Take 1 tablet (1 mg total) by mouth 3 (three) times daily as needed for anxiety. 09/26/16   Arfeen, Phillips Grout, MD  metroNIDAZOLE (FLAGYL) 500 MG tablet Take 1 tablet (500 mg total) by mouth 2 (two) times  daily. 10/03/16   Derwood Kaplan, MD  ondansetron (ZOFRAN ODT) 4 MG disintegrating tablet Take 1 tablet (4 mg total) by mouth every 8 (eight) hours as needed for nausea or vomiting. 10/03/16   Derwood Kaplan, MD  paliperidone (INVEGA) 9 MG 24 hr tablet Take 1 tablet (9 mg total) by mouth at bedtime. 09/26/16   Arfeen, Phillips Grout, MD  PARoxetine (PAXIL-CR) 37.5 MG 24 hr tablet Take 1 tablet (37.5 mg total) by mouth every morning. 09/26/16   Arfeen, Phillips Grout, MD  pravastatin (PRAVACHOL) 20 MG tablet Take 20 mg by mouth at bedtime.  02/26/16 02/25/17  [provider]  prazosin (MINIPRESS) 2 MG capsule Take 1 capsule (2 mg total) by mouth at bedtime. 09/26/16   Arfeen, Phillips Grout, MD  propranolol  (INDERAL) 20 MG tablet Take 1 tablet (20 mg total) by mouth 2 (two) times daily. 09/26/16   Arfeen, Phillips Grout, MD  tiZANidine (ZANAFLEX) 4 MG tablet Take 4-8 mg by mouth at bedtime.  07/10/16   [provider]  topiramate (TOPAMAX) 50 MG tablet Take 100 mg by mouth 2 (two) times daily.    [provider]  vitamin B-12 (CYANOCOBALAMIN) 1000 MCG tablet Take 1,000 mcg by mouth daily. 09/05/16 09/05/17  [provider]  Vitamin D, Ergocalciferol, (DRISDOL) 50000 units CAPS capsule Take 50,000 Units by mouth every 7 (seven) days. saturday    [provider]    Family History Family History  Problem Relation Age of Onset  . Adopted: Yes    Social History Social History  Substance Use Topics  . Smoking status: Former Smoker    Packs/day: 1.50    Years: 30.00    Types: Cigarettes    Quit date: 09/20/2011  . Smokeless tobacco: Never Used  . Alcohol use No     Allergies   Tomato; Imitrex [sumatriptan base]; and Mustard seed   Review of Systems Review of Systems  Gastrointestinal: Positive for abdominal pain.  All other systems reviewed and are negative except that which was mentioned in HPI    Physical Exam Updated Vital Signs BP 112/87 (BP Location: Left Arm)   Pulse (!) 55   Temp 98.6 F (37 C) (Oral)   Resp 16   Ht 5\' 11"  (1.803 m)   Wt 111.1 kg (245 lb)   SpO2 100%   BMI 34.17 kg/m   Physical Exam  Constitutional: She is oriented to person, place, and time. She appears well-developed and well-nourished. No distress.  HENT:  Head: Normocephalic and atraumatic.  Moist mucous membranes  Eyes: Conjunctivae are normal. Pupils are equal, round, and reactive to light.  Neck: Neck supple.  Cardiovascular: Normal rate, regular rhythm and normal heart sounds.   No murmur heard. Pulmonary/Chest: Effort normal and breath sounds normal.  Abdominal: Soft. Bowel sounds are normal. She exhibits no distension. There is tenderness (LLQ). There is guarding  (localized to LLQ). There is no rebound.  Musculoskeletal: She exhibits no edema.  Neurological: She is alert and oriented to person, place, and time.  Fluent speech  Skin: Skin is warm and dry. Rash noted.  Scattered scabs on abdomen, arms, and legs Healing ecchymosis on R breast with central firmness, no nipple discharge, no erythema or warmth  Psychiatric: Judgment normal.  Nursing note and vitals reviewed.  Chaperone was present during exam.   ED Treatments / Results  Labs (all labs ordered are listed, but only abnormal results are displayed) Labs Reviewed  COMPREHENSIVE METABOLIC PANEL - Abnormal; Notable  for the following:       Result Value   CO2 19 (*)    Glucose, Bld 115 (*)    Creatinine, Ser 1.07 (*)    Calcium 8.8 (*)    All other components within normal limits  CBC - Abnormal; Notable for the following:    Hemoglobin 11.5 (*)    RDW 16.3 (*)    All other components within normal limits  URINALYSIS, ROUTINE W REFLEX MICROSCOPIC - Abnormal; Notable for the following:    APPearance HAZY (*)    Hgb urine dipstick MODERATE (*)    Leukocytes, UA LARGE (*)    Bacteria, UA MANY (*)    Squamous Epithelial / LPF 0-5 (*)    All other components within normal limits  LIPASE, BLOOD    EKG  EKG Interpretation None       Radiology Ct Abdomen Pelvis W Contrast  Result Date: 10/05/2016 CLINICAL DATA:  Persistent left lower quadrant pain and diarrhea. EXAM: CT ABDOMEN AND PELVIS WITH CONTRAST TECHNIQUE: Multidetector CT imaging of the abdomen and pelvis was performed using the standard protocol following bolus administration of intravenous contrast. CONTRAST:  ISOVUE-300 IOPAMIDOL (ISOVUE-300) INJECTION 61% COMPARISON:  None. FINDINGS: Lower chest: Masslike structure, perhaps complex fluid collection, within the superficial soft tissues of the right breast, incompletely imaged, measuring at least 11 cm greatest dimension. Hepatobiliary: No focal liver abnormality is  seen. No gallstones, gallbladder wall thickening, or biliary dilatation. Pancreas: Unremarkable. No pancreatic ductal dilatation or surrounding inflammatory changes. Spleen: Normal in size without focal abnormality. Adrenals/Urinary Tract: Adrenal glands appear normal. Kidneys appear normal without mass, stone or hydronephrosis. No ureteral or bladder calculi identified. Stomach/Bowel: Bowel is normal in caliber. No bowel wall thickening or evidence of bowel wall inflammation seen. Appendix is normal. Stomach appears normal, decompressed. Vascular/Lymphatic: No significant vascular findings are present. No enlarged abdominal or pelvic lymph nodes. Reproductive: Uterus and bilateral adnexa are unremarkable. Tubal ligation clips in place. Other: No free fluid or abscess collections seen within the abdomen or pelvis. No free intraperitoneal air. Musculoskeletal: No acute or suspicious osseous finding. Mild degenerative change at the lumbosacral junction. IMPRESSION: 1. Masslike structure within the superficial soft tissues of the right breast, perhaps complex fluid collection, suspicious for abscess versus necrotic carcinoma, measuring at least 11 cm greatest dimension. If any clinical symptoms suggesting breast abscess or mastitis, recommend breast ultrasound now for further characterization. If no such clinical symptoms, recommend referral to the [facility] for diagnostic mammogram and ultrasound. 2. No acute findings within the abdomen or pelvis. No source for left lower quadrant pain and diarrhea. No bowel obstruction or evidence of bowel wall inflammation seen. No mass or fluid within the left adnexa. No renal or ureteral calculi. These results were called by telephone at the time of interpretation on 10/05/2016 at 8:38 am to Dr. Frederick Peers , who verbally acknowledged these results. Electronically Signed   By: Bary Richard M.D.   On: 10/05/2016 08:41    Procedures Procedures (including critical care  time)  Medications Ordered in ED Medications  sodium chloride 0.9 % bolus 1,000 mL (0 mLs Intravenous Stopped 10/05/16 0936)  iopamidol (ISOVUE-300) 61 % injection 100 mL (100 mLs Intravenous Contrast Given 10/05/16 0819)     Initial Impression / Assessment and Plan / ED Course  I have reviewed the triage vital signs and the nursing notes.  Pertinent labs & imaging results that were available during my care of the patient were reviewed by  me and considered in my medical decision making (see chart for details).    PT w/ Persistent left lower quadrant pain, evaluated a few days ago and returns today due to persistent symptoms. She was nontoxic on exam with reassuring vital signs. She had focal left lower quadrant tenderness without any rebound or guarding. Also noted scattered scabbing red rash, I suspect bedbugs. Placed patient on contact precautions.  Labs today are similar to recent lab work, UA similar in appearance. Normal WBC count. Because the patient's symptoms have not resolved on cipro/flagyl, recommended CT for evaluation of other acute intra-abdominal process. CT was negative for any acute intra-abdominal process to explain the patient's symptoms. I was however contacted by the radiologist due to abnormality on right breast. CT showed masslike structure with possible complex fluid collection. On further questioning, the patient states that she fell and struck her right breast a few months ago and had significant bruising afterwards. It has been slowly healing since then. I suspect that her CT findings are related to fat necrosis from her trauma. She has no evidence of infection. [facility] and they state she can follow up there for right breast mammogram and evaluation.   I discussed return precautions and instructed to finish course of antibiotics given concurrent diarrhea. Also discussed bed  bug treatment at home. Patient and husband voiced understanding and she was discharged in satisfactory condition.  Final Clinical Impressions(s) / ED Diagnoses   Final diagnoses:  Breast mass, right  History of trauma of breast  LLQ pain  Diarrhea of presumed infectious origin  Bedbug bite, initial encounter    New Prescriptions Discharge Medication List as of 10/05/2016  8:56 AM       Lord Lancour, Ambrose Finlandachel Morgan, MD 10/06/16 [phone]

## 2016-10-05 NOTE — Discharge Instructions (Signed)
SCHEDULE AN APPOINTMENT AT THE [facility] CENTER OR IN [place] FOR FOLLOW UP MAMMOGRAM OF RIGHT BREAST. FOLLOW UP WITH PRIMARY CARE PROVIDER REGARDING LEFT LOWER ABDOMINAL PAIN. RETURN TO ER IF ANY FEVERS, VOMITING, BLOODY STOOLS OR WORSENING SYMPTOMS.

## 2016-10-05 NOTE — ED Triage Notes (Signed)
Patient presents with complaints of LLQ abdominal pain that started Thursday. Pt was seen here Thursday and was told to return if her pain got worse. Patient dx with diverticulitis at time of visit. Pt states she is taking her antibiotics and nausea medications. States the pain has gradually gotten worse and worse.

## 2016-10-07 ENCOUNTER — Other Ambulatory Visit (HOSPITAL_COMMUNITY): Payer: Self-pay

## 2016-10-07 ENCOUNTER — Other Ambulatory Visit: Payer: Self-pay

## 2016-10-07 DIAGNOSIS — N6310 Unspecified lump in the right breast, unspecified quadrant: Secondary | ICD-10-CM

## 2016-10-07 DIAGNOSIS — F4310 Post-traumatic stress disorder, unspecified: Secondary | ICD-10-CM

## 2016-10-07 DIAGNOSIS — F200 Paranoid schizophrenia: Secondary | ICD-10-CM

## 2016-10-07 MED ORDER — PRAZOSIN HCL 2 MG PO CAPS: 2.0000 mg | ORAL_CAPSULE | Freq: Every day | ORAL | 0 refills | Status: DC

## 2016-10-14 ENCOUNTER — Telehealth (HOSPITAL_COMMUNITY): Payer: Self-pay

## 2016-10-14 NOTE — Telephone Encounter (Signed)
Patient is calling because she is taking a trip with her family to a campground and they will be there for 3 days. She wants to know if you will prescribe 3 or 4 Xanax in case she has a really bad panic attack. Please review and advise, thank you

## 2016-10-15 NOTE — Telephone Encounter (Signed)
She is already taking Valium.  Cannot prescribe Xanax with Valium

## 2016-10-15 NOTE — Telephone Encounter (Signed)
I called and left a voicemail for her husband letting him know that Dr. Lolly MustacheArfeen is not going to give extra medications.

## 2016-10-16 ENCOUNTER — Telehealth (HOSPITAL_COMMUNITY): Payer: Self-pay

## 2016-10-16 ENCOUNTER — Ambulatory Visit (INDEPENDENT_AMBULATORY_CARE_PROVIDER_SITE_OTHER): Payer: BLUE CROSS/BLUE SHIELD | Admitting: Clinical

## 2016-10-16 ENCOUNTER — Encounter (HOSPITAL_COMMUNITY): Payer: Self-pay | Admitting: Clinical

## 2016-10-16 DIAGNOSIS — F200 Paranoid schizophrenia: Secondary | ICD-10-CM | POA: Diagnosis not present

## 2016-10-16 DIAGNOSIS — F4310 Post-traumatic stress disorder, unspecified: Secondary | ICD-10-CM | POA: Diagnosis not present

## 2016-10-16 NOTE — Telephone Encounter (Signed)
Medication management - Met with patient, in to see Audelia Acton, therapist today as she was following up on request for something to be added to her medication due to planned family camping trip in [place] in the coming weekend and is anxious about the trip.  Informed Dr. Adele Schilder responded back to [facility] phone message to him for patient from 10/14/16 that he was not willing to add Xanax due to patient already taking a benzodiazepine medication.  Discussed with patient concerns the medication would loose its effectiveness and can be very addictive with continued addition of more medication or adding another benzodiazepine as an additional solution. Informed this is not a safe practice and doubtful Dr. Adele Schilder would agree to this even with further discussion.  Agreed to question if Dr. Adele Schilder would consider maybe a weekend trial of Xanax in place of Ativan or to see if he would consider anything else such as the addition of Buspar and to call patient back prior to her leaving 10/16/16 am.  Requested patient call us back if had not heard back by the time they get ready to leave but again informed patient due to her history, there was concern for patient to be a fall risk and overuse of this type of medication and Dr. Adele Schilder was already treating with a dosage this nurse would not be sure if he would consider increasing or changing to another medication but will pass on the message and follow up with patient.  Patient stated understanding but admitted she is "scared to death" about going on this trip with other family members over the coming weekend.

## 2016-10-16 NOTE — Progress Notes (Signed)
   THERAPIST PROGRESS NOTE  Session Time: 4:35- 5:30  Participation Level: Active  Behavioral Response: CasualAlertAnxious and Depressed  Type of Therapy: Individual Therapy  Treatment Goals addressed: Improve Psychiatric Symptoms, Emotional Regulation Skills, Calming Skills, Healthy Coping Skills  Interventions: Motivational Interviewing,   Summary: Danielle [name] is a 46 y.o. female who presents with Schizophrenia and PTSD.   Suicidal/Homicidal: No - without intent/plan  Therapist Response:  Athena met with clinician for an individual session. Danielle [name]'s husband joined the session to support her. Danielle [name] discussed her psychiatric symptoms, her current life events. Danielle [name] shared that she has been having some difficulty dealing with her physical issues. She left last session directly after arriving due to abdominal pain. She shared that it is likely she has diverticulitis. She shared that she has been having other physical difficulties. She shared that she has been experiencing additional stress due to her children's behaviors. Client and clinician discussed healthy coping skills. Clinician shared with Danielle [name] clinician's intention to leave the practice. Clinician asked open ended questions and Sion shared her thoughts and emotions. Danielle [name] had a difficult time with the news. Client and clinician discussed how she has benefited from therapy. Client and clinician discussed different options for transferring to another clinician to continue therapy. Client and clinician discussed healthy coping skills  Plan: Return again in 2 weeks.  Diagnosis:     Axis I: Schizophrenia, paranoid type and PTSD    Deandra Gadson A, LCSW 10/16/2016

## 2016-10-30 ENCOUNTER — Other Ambulatory Visit (HOSPITAL_COMMUNITY): Payer: Self-pay

## 2016-10-30 DIAGNOSIS — F4310 Post-traumatic stress disorder, unspecified: Secondary | ICD-10-CM

## 2016-10-30 MED ORDER — LORAZEPAM 1 MG PO TABS: 1.0000 mg | ORAL_TABLET | Freq: Three times a day (TID) | ORAL | 0 refills | Status: DC | PRN

## 2016-11-05 ENCOUNTER — Other Ambulatory Visit (HOSPITAL_COMMUNITY): Payer: Self-pay

## 2016-11-05 DIAGNOSIS — F200 Paranoid schizophrenia: Secondary | ICD-10-CM

## 2016-11-05 DIAGNOSIS — F4310 Post-traumatic stress disorder, unspecified: Secondary | ICD-10-CM

## 2016-11-05 MED ORDER — PALIPERIDONE ER 9 MG PO TB24: 9.0000 mg | ORAL_TABLET | Freq: Every day | ORAL | 0 refills | Status: DC

## 2016-11-05 MED ORDER — PROPRANOLOL HCL 20 MG PO TABS: 20.0000 mg | ORAL_TABLET | Freq: Two times a day (BID) | ORAL | 0 refills | Status: DC

## 2016-11-05 MED ORDER — PAROXETINE HCL ER 37.5 MG PO TB24: 37.5000 mg | ORAL_TABLET | Freq: Every morning | ORAL | 0 refills | Status: DC

## 2016-11-11 ENCOUNTER — Encounter (HOSPITAL_COMMUNITY): Payer: Self-pay | Admitting: Clinical

## 2016-11-11 ENCOUNTER — Ambulatory Visit (INDEPENDENT_AMBULATORY_CARE_PROVIDER_SITE_OTHER): Payer: BLUE CROSS/BLUE SHIELD | Admitting: Clinical

## 2016-11-11 DIAGNOSIS — F4310 Post-traumatic stress disorder, unspecified: Secondary | ICD-10-CM

## 2016-11-11 DIAGNOSIS — F200 Paranoid schizophrenia: Secondary | ICD-10-CM

## 2016-11-11 NOTE — Progress Notes (Signed)
   THERAPIST PROGRESS NOTE  Session Time: 9:00 -10:00  Participation Level: Active  Behavioral Response: CasualAlertDepressed Type of Therapy: Individual Therapy  Treatment Goals addressed: Improve Psychiatric Symptoms, Emotional Regulation Skills, Calming Skills, Healthy Coping Skills,   Interventions: Motivational Interviewing,  psychoeducation  Summary: Danielle [name] is a 46 y.o. female who presents with Schizophrenia and PTSD.   Suicidal/Homicidal: No - without intent/plan  Therapist Response:  [name] met with clinician for an individual session. [name]'s husband joined the session to support her. [name] discussed her psychiatric symptoms, her current life events. [name] was very distraught about therapist upcoming departure. She began to cry and passed out. Clinician asked nurse to join session. Nurse took [name]'s vitals. Husband and [name] shared that she has been passing out frequently and they have made an appoint August 20th with the [facility] clinic to find out the cause of this problem. They shared that [name]'s psychiatric symptoms have also increased. She is experiencing Panic attack on average 4x a day with increased hallucinations and Nightmares about her past traumas. Client and clinician reviewed and discussed some calming and coping skills. Client and clinician also discussed the transition to a new clinician .    Plan: Return again in 2 weeks.  Diagnosis:     Axis I: Schizophrenia, paranoid type and PTSD    Cameo Shewell A, LCSW 11/11/2016

## 2016-11-19 ENCOUNTER — Telehealth (HOSPITAL_COMMUNITY): Payer: Self-pay

## 2016-11-19 NOTE — Telephone Encounter (Signed)
Patients husband called, he said that patient passed out yesterday and he took her to the hospital because she lost her memory. He states that her memory started to come back some last night. The ED could find nothing wrong, they did another MRI and it was the same as previous. They have an upcoming appointment with a neurologist and will be her to see you next week. Patients husband states he knows there is nothing that can be done from your standpoint, he just wanted you to know what was going on.

## 2016-11-28 ENCOUNTER — Other Ambulatory Visit (HOSPITAL_COMMUNITY): Payer: Self-pay

## 2016-11-28 ENCOUNTER — Encounter (HOSPITAL_COMMUNITY): Payer: Self-pay | Admitting: Psychiatry

## 2016-11-28 ENCOUNTER — Ambulatory Visit (INDEPENDENT_AMBULATORY_CARE_PROVIDER_SITE_OTHER): Payer: BLUE CROSS/BLUE SHIELD | Admitting: Psychiatry

## 2016-11-28 VITALS — BP 118/70 | HR 67 | Ht 70.5 in | Wt 252.0 lb

## 2016-11-28 DIAGNOSIS — F250 Schizoaffective disorder, bipolar type: Secondary | ICD-10-CM

## 2016-11-28 DIAGNOSIS — Z87891 Personal history of nicotine dependence: Secondary | ICD-10-CM

## 2016-11-28 DIAGNOSIS — F4310 Post-traumatic stress disorder, unspecified: Secondary | ICD-10-CM | POA: Diagnosis not present

## 2016-11-28 MED ORDER — LOXAPINE SUCCINATE 25 MG PO CAPS: 25.0000 mg | ORAL_CAPSULE | Freq: Every day | ORAL | 1 refills | Status: DC

## 2016-11-28 MED ORDER — CLONAZEPAM 0.5 MG PO TABS: 0.5000 mg | ORAL_TABLET | Freq: Three times a day (TID) | ORAL | 1 refills | Status: DC | PRN

## 2016-11-28 MED ORDER — LITHIUM CARBONATE ER 450 MG PO TBCR: 900.0000 mg | EXTENDED_RELEASE_TABLET | Freq: Every day | ORAL | 1 refills | Status: DC

## 2016-11-28 NOTE — Progress Notes (Signed)
BH MD/PA/NP OP Progress Note  11/28/2016 4:28 PM Danielle [name]  MRN:  [id number]   Chief Complaint:  Chief Complaint    Follow-up     Subjective:  I was hospitalized due to passing out.  I was discharged 2 days ago.  HPI: Patient came for her appointment with her niece.  She was admitted to the hospital due to passing out and she found to be tachycardic.  She was admitted at [facility].  Her medicines were adjusted.  She is taking Inderal and her Minipress was discontinued.  Her Ativan was also discontinued and she was given Klonopin 0.5 mg twice a day.  Her Topamax was reduced to 100.  She is not taking loxapine and and vague was discontinued.  Her lithium was continued.  We do not have lithium level.  Patient denies any passing out but continues to have anxiety, panic attacks and hallucination.  Though she denies any dizziness or fall but she is worried about her anxiety.  Her niece is staying with her.  Now patient is scheduled to see physician at [facility] for persistent passing out.  Her appetite is okay.  She denies any suicidal thoughts or homicidal thought.  She denies any aggressive behavior.  Visit Diagnosis:    ICD-10-CM   1. PTSD (post-traumatic stress disorder) F43.10 clonazePAM (KLONOPIN) 0.5 MG tablet    lithium carbonate (ESKALITH) 450 MG CR tablet    loxapine (LOXITANE) 25 MG capsule    Past Psychiatric History: Reviewed. Patient has multiple hospitalization do to psychosis, paranoia, hallucination, suicidal attempt, extreme panic attack and severe depression. Her last admission was in April 2018 at old [facility] She has at least 4 suicidal attempt. She has history of cutting herself and drowning herself. In the past she has tried multiple psychiatric medication with limited response. She has taken Zoloft, amitriptyline, Wellbutrin, Latuda, Xanax, Valium, Risperdal, lithium, Haldol, Depakote, Zyprexa, Geodon and recently Lamictal. At her recent  hospitalization at old [facility], she was started on Paxil, temazepam, imipramine and Invega.   Past Medical History:  Past Medical History:  Diagnosis Date  . Anemia   . Anxiety   . Bipolar 1 disorder (HCC)   . Depressed   . Headache(784.0)   . Heart murmur   . Hypothyroidism   . PTSD (post-traumatic stress disorder)   . Schizophrenia (HCC)   . Seizures (HCC)   . Shortness of breath     Past Surgical History:  Procedure Laterality Date  . FOOT SURGERY    . LEG SURGERY    . NASAL FRACTURE SURGERY    . TUBAL LIGATION  1996    Family Psychiatric History: Reviewed.  Family History:  Family History  Problem Relation Age of Onset  . Adopted: Yes    Social History:  Social History   Social History  . Marital status: Married    Spouse name: N/A  . Number of children: 2  . Years of education: N/A   Occupational History  . unemployed Not Employed   Social History Main Topics  . Smoking status: Former Smoker    Packs/day: 1.50    Years: 30.00    Types: Cigarettes    Quit date: 09/20/2011  . Smokeless tobacco: Never Used  . Alcohol use No  . Drug use: No  . Sexual activity: Yes    Partners: Male    Birth control/ protection: Surgical   Other Topics Concern  . Not on file   Social History Narrative  Pt is adopted. Unsure of family history.    Allergies:  Allergies  Allergen Reactions  . Tomato Rash    Pt reports that she breaks out in a rash if she eats tomato based foods but can eat a whole tomato  . Imitrex [Sumatriptan Base] Hives  . Mustard Seed Rash    [facility]    Metabolic Disorder Labs: Lab Results  Component Value Date   HGBA1C 5.5 06/08/2015   MPG 123 (H) 02/05/2012   MPG 114 09/22/2011   Lab Results  Component Value Date   PROLACTIN 39.1 (H) 06/08/2015   Lab Results  Component Value Date   CHOL 218 (H) 06/08/2015   TRIG 215 (H) 06/08/2015   HDL 36 (L) 06/08/2015   CHOLHDL 6.1 06/08/2015   VLDL 43 (H) 06/08/2015    LDLCALC 139 (H) 06/08/2015   LDLCALC UNABLE TO CALCULATE IF TRIGLYCERIDE OVER 400 mg/dL 40/98/119105/19/2013     Current Medications: Current Outpatient Prescriptions  Medication Sig Dispense Refill  . acetaminophen (TYLENOL 8 HOUR) 650 MG CR tablet Take 1 tablet (650 mg total) by mouth every 8 (eight) hours as needed for pain. 30 tablet 0  . aspirin EC 81 MG tablet Take 1 tablet (81 mg total) by mouth daily with breakfast. 30 tablet 0  . ciprofloxacin (CIPRO) 500 MG tablet Take 1 tablet (500 mg total) by mouth every 12 (twelve) hours. 14 tablet 0  . eletriptan (RELPAX) 40 MG tablet TAKE 1 TAB AT ONSET OF HEADACHES. MAY REPEAT IN 2 HOURS.MAX 2/24 HRS. FOR 30 DAYS  6  . folic acid (FOLVITE) 1 MG tablet Take 1 tablet (1 mg total) by mouth daily. 30 tablet 0  . ibuprofen (ADVIL,MOTRIN) 800 MG tablet Take 800 mg by mouth every 8 (eight) hours as needed (pain).    Marland Kitchen. levothyroxine (SYNTHROID, LEVOTHROID) 75 MCG tablet Take 1 tablet (75 mcg total) by mouth daily before breakfast. 30 tablet 0  . LORazepam (ATIVAN) 1 MG tablet Take 1 tablet (1 mg total) by mouth 3 (three) times daily as needed for anxiety. 90 tablet 0  . metroNIDAZOLE (FLAGYL) 500 MG tablet Take 1 tablet (500 mg total) by mouth 2 (two) times daily. 14 tablet 0  . ondansetron (ZOFRAN ODT) 4 MG disintegrating tablet Take 1 tablet (4 mg total) by mouth every 8 (eight) hours as needed for nausea or vomiting. 20 tablet 0  . paliperidone (INVEGA) 9 MG 24 hr tablet Take 1 tablet (9 mg total) by mouth at bedtime. 90 tablet 0  . PARoxetine (PAXIL-CR) 37.5 MG 24 hr tablet Take 1 tablet (37.5 mg total) by mouth every morning. 90 tablet 0  . pravastatin (PRAVACHOL) 20 MG tablet Take 20 mg by mouth at bedtime.     . prazosin (MINIPRESS) 2 MG capsule Take 1 capsule (2 mg total) by mouth at bedtime. 90 capsule 0  . propranolol (INDERAL) 20 MG tablet Take 1 tablet (20 mg total) by mouth 2 (two) times daily. 180 tablet 0  . tiZANidine (ZANAFLEX) 4 MG tablet Take  4-8 mg by mouth at bedtime.   6  . topiramate (TOPAMAX) 50 MG tablet Take 100 mg by mouth 2 (two) times daily.    . vitamin B-12 (CYANOCOBALAMIN) 1000 MCG tablet Take 1,000 mcg by mouth daily.    . Vitamin D, Ergocalciferol, (DRISDOL) 50000 units CAPS capsule Take 50,000 Units by mouth every 7 (seven) days. saturday     No current facility-administered medications for this visit.  Neurologic: Headache: Yes Seizure: No Paresthesias: No  Musculoskeletal: Strength & Muscle Tone: within normal limits Gait & Station: normal Patient leans: N/A  Psychiatric Specialty Exam: Review of Systems  Constitutional: Negative.   HENT: Negative.   Musculoskeletal: Negative.   Skin: Negative.   Neurological: Positive for dizziness.       Episodes of passing out  Psychiatric/Behavioral: The patient is nervous/anxious.     Height 5' 10.5" (1.791 m), weight 252 lb (114.3 kg).Body mass index is 35.65 kg/m.  General Appearance: Casual  Eye Contact:  Fair  Speech:  rambling  Volume:  Increased  Mood:  Anxious  Affect:  Depressed  Thought Process:  Descriptions of Associations: Circumstantial  Orientation:  Full (Time, Place, and Person)  Thought Content: Rumination   Suicidal Thoughts:  No  Homicidal Thoughts:  No  Memory:  Immediate;   Fair Recent;   Fair Remote;   Fair  Judgement:  Fair  Insight:  Fair  Psychomotor Activity:  Increased  Concentration:  Concentration: Fair and Attention Span: Fair  Recall:  FiservFair  Fund of Knowledge: Fair  Language: Good  Akathisia:  No  Handed:  Right  AIMS (if indicated):  0  Assets:  Communication Skills Desire for Improvement Housing Resilience  ADL's:  Intact  Cognition: WNL  Sleep:  fair    Assessment: Posttraumatic stress disorder.  Schizoaffective disorder bipolar type.    Plan: I reviewed records from last hospitalization and current medication.  There are a lot of changes in the medication.  She is back on Loxitane and Klonopin  which she had tried in the past.  Patient continued to have anxiety and nervousness.  She does not feel that Klonopin working.  I recommended to try 0.5 mg 3 times a day to help anxiety and panic attacks.  I'm afraid to change more medication because she has a history of fall and dizziness.  I recommended to continue these medication for at least 6 weeks to 2 months to see the response.  I encouraged [facility].  We will do a lithium level.  I will continue Paxil 37.5 mg daily, Loxitane 25 mg at bedtime, lithium 900 mg daily and she will try Klonopin 0.5 mg up to 3 times a day.  Discuss safety concern that anytime having active suicidal thoughts or homicidal thoughts and she need to call 911 or go to the local emergency room.  Follow-up in 2 months.  Time spent 25 minutes.    Rasheedah Reis T., MD 11/28/2016, 4:28 PM

## 2017-01-01 ENCOUNTER — Ambulatory Visit (INDEPENDENT_AMBULATORY_CARE_PROVIDER_SITE_OTHER): Payer: BLUE CROSS/BLUE SHIELD | Admitting: Clinical

## 2017-01-01 ENCOUNTER — Encounter (HOSPITAL_COMMUNITY): Payer: Self-pay | Admitting: Clinical

## 2017-01-01 DIAGNOSIS — F4310 Post-traumatic stress disorder, unspecified: Secondary | ICD-10-CM

## 2017-01-01 DIAGNOSIS — F209 Schizophrenia, unspecified: Secondary | ICD-10-CM

## 2017-01-01 NOTE — Progress Notes (Signed)
   THERAPIST PROGRESS NOTE  [name] Time: 4:10pm-5:05pm  Participation Level: Active  Behavioral Response: NeatAlertAnxious  Type of Therapy: Individual Therapy  Treatment Goals addressed: Improve Psychiatric Symptoms, Emotional Regulation Skills, Calming Skills, Healthy Coping Skills, discuss and process traumatic event, reduced irrational worries and fears  Interventions: Motivational Interviewing, Cognitive Behavior Therapy and Grounding/Mindfulness Skills, psychoeducation  Summary: Danielle [name] is a 46 y.o. female who presents with Schizophrenia and PTSD.   Suicidal/Homicidal: No - without intent/plan  Therapist Response:  Poet met with clinician for an individual [name]. Danielle [name]'s husband joined the [name] to support her. Danielle [name] discussed her psychiatric symptoms, her current life events. Danielle [name] shared that she has been more symptomatic with anxiety and nightmares over the past few weeks. Danielle [name] reported higher levels of stress due to family interactions and health issues. Danielle [name] reports a recent hospitalization due to heart problems where she also had psychiatric medications adjusted. Clinician built rapport with Danielle [name] and reviewed her medications. Danielle [name], but revived within a few minutes. Clinician reviewed and updated treatment plan.   Plan: Return again in 2 weeks.  Diagnosis:     Axis I: Schizophrenia, paranoid type and PTSD  Danielle Pavlov, LCSW 01/01/17

## 2017-01-15 ENCOUNTER — Encounter (HOSPITAL_COMMUNITY): Payer: Self-pay | Admitting: Licensed Clinical Social Worker

## 2017-01-15 ENCOUNTER — Ambulatory Visit (HOSPITAL_COMMUNITY): Payer: Self-pay | Admitting: Clinical

## 2017-01-15 ENCOUNTER — Ambulatory Visit (INDEPENDENT_AMBULATORY_CARE_PROVIDER_SITE_OTHER): Payer: BLUE CROSS/BLUE SHIELD | Admitting: Licensed Clinical Social Worker

## 2017-01-15 DIAGNOSIS — F200 Paranoid schizophrenia: Secondary | ICD-10-CM

## 2017-01-15 DIAGNOSIS — F4310 Post-traumatic stress disorder, unspecified: Secondary | ICD-10-CM | POA: Diagnosis not present

## 2017-01-15 NOTE — Progress Notes (Signed)
   THERAPIST PROGRESS NOTE  Session Time: 4:30pm-5:30pm  Participation Level: Active  Behavioral Response: CasualAlertEuthymic  Type of Therapy: Individual Therapy  Treatment Goals addressed: Improve Psychiatric Symptoms, Emotional Regulation Skills, Calming Skills, Healthy Coping Skills, discuss and process traumatic event, reduced irrational worries and fears  Interventions: Motivational Interviewing, Cognitive Behavior Therapy and Grounding/Mindfulness Skills, psychoeducation  Summary: Danielle [name] is a 46 y.o. female who presents with Schizophrenia and PTSD.   Suicidal/Homicidal: No - without intent/plan  Therapist Response:  [name] met with clinician for an individual session. [name]'s husband joined the session to support her. [name] discussed her psychiatric symptoms, her current life events and her homework. [name] shared that she has been doing pretty well over the past two weeks. [name] reported only one incident of passing out, which was a significant improvement over the report at the previous session. [name] reports ongoing panic attacks, which she reports are not being helped by the Klonopin. [name] also reports difficulty with sleeping. [name] and husband engaged well in discussion about concerns about the children, as well as her separation anxiety with her husband. [name] engaged well in grounding and deep breathing.   Plan: Return again in 2 weeks.  Diagnosis:     Axis I: Schizophrenia, paranoid type and PTSD   [name], LCSW 01/15/2017

## 2017-01-27 ENCOUNTER — Ambulatory Visit (INDEPENDENT_AMBULATORY_CARE_PROVIDER_SITE_OTHER): Payer: BLUE CROSS/BLUE SHIELD | Admitting: Psychiatry

## 2017-01-27 ENCOUNTER — Encounter (HOSPITAL_COMMUNITY): Payer: Self-pay | Admitting: Psychiatry

## 2017-01-27 DIAGNOSIS — Z79899 Other long term (current) drug therapy: Secondary | ICD-10-CM | POA: Diagnosis not present

## 2017-01-27 DIAGNOSIS — G4700 Insomnia, unspecified: Secondary | ICD-10-CM | POA: Diagnosis not present

## 2017-01-27 DIAGNOSIS — F4310 Post-traumatic stress disorder, unspecified: Secondary | ICD-10-CM | POA: Diagnosis not present

## 2017-01-27 DIAGNOSIS — F250 Schizoaffective disorder, bipolar type: Secondary | ICD-10-CM

## 2017-01-27 DIAGNOSIS — Z87891 Personal history of nicotine dependence: Secondary | ICD-10-CM

## 2017-01-27 MED ORDER — LOXAPINE SUCCINATE 25 MG PO CAPS: 25.0000 mg | ORAL_CAPSULE | Freq: Every day | ORAL | 0 refills | Status: DC

## 2017-01-27 MED ORDER — LITHIUM CARBONATE ER 450 MG PO TBCR: 900.0000 mg | EXTENDED_RELEASE_TABLET | Freq: Every day | ORAL | 1 refills | Status: DC

## 2017-01-27 MED ORDER — CLONAZEPAM 0.5 MG PO TABS
0.5000 mg | ORAL_TABLET | Freq: Three times a day (TID) | ORAL | 1 refills | Status: DC | PRN
Start: 2017-01-27 — End: 2017-04-01

## 2017-01-27 MED ORDER — PROPRANOLOL HCL 20 MG PO TABS: 20.0000 mg | ORAL_TABLET | Freq: Two times a day (BID) | ORAL | 0 refills | Status: DC

## 2017-01-27 MED ORDER — PAROXETINE HCL ER 37.5 MG PO TB24: 37.5000 mg | ORAL_TABLET | Freq: Every morning | ORAL | 0 refills | Status: DC

## 2017-01-27 NOTE — Progress Notes (Signed)
BH MD/PA/NP OP Progress Note  01/27/2017 1:03 PM Danielle [name]  MRN:  [id number]  Chief Complaint: I have procedure on October 23.  I'm doing much better.  I started driving short distances.  I want to try CBD oil. Chief Complaint    Follow-up     HPI: Patient came for her follow-up appointment with her husband.  She went to [facility] clinic for her passing out and disappointed because no further recommendation given.  She is scheduled to have cardiac ablation on October 23.  She is taking propranolol and cardiac exam and hoping to stop these medication once she had procedure.  She also want to try CBT oil because she heard that it helps depression.  Patient told one of her family member is taking it and she has noticed improvement in the symptoms.  She is getting propanolol because of nightmares.  Overall her mood is good but she has chronic insomnia and sometimes she does not sleep well.  She did not have a lithium level but promised to have it done next week.  She has blood work next week requested by her primary care physician.  She is taking Klonopin, Loxitane, lithium and Paxil.  Her affect is improved from the past.  She has no more dizziness or fall .  Her anxiety is much better.  She denies any hallucination or any paranoia.  She admitted appetite is increased and she had gained weight from the past.  She denies any suicidal thoughts, anger issues or any homicidal thoughts.  She is seeing [name] for therapy.    Visit Diagnosis:    ICD-10-CM   1. PTSD (post-traumatic stress disorder) F43.10 propranolol (INDERAL) 20 MG tablet    PARoxetine (PAXIL-CR) 37.5 MG 24 hr tablet    loxapine (LOXITANE) 25 MG capsule    lithium carbonate (ESKALITH) 450 MG CR tablet    clonazePAM (KLONOPIN) 0.5 MG tablet    Past Psychiatric History: Reviewed. Patient has multiple hospitalization do to psychosis, paranoia, hallucination, suicidal attempt, extreme panic attack and severe depression. Her last admission  was in April 2018 at old [facility]. She has at least 4 suicidal attempt. She has history of cutting herself and drowning herself. In the past she has tried multiple psychiatric medication with limited response. She has taken Zoloft, amitriptyline, Wellbutrin, Latuda, Xanax, Valium, Risperdal, lithium, Haldol, Depakote, Zyprexa, Geodon and recently Lamictal. At her recent hospitalization at old [facility], she was started on Paxil, temazepam, imipramine and Invega.   Past Medical History:  Past Medical History:  Diagnosis Date  . Anemia   . Anxiety   . Bipolar 1 disorder (HCC)   . Depressed   . Headache(784.0)   . Heart murmur   . Hypothyroidism   . PTSD (post-traumatic stress disorder)   . Schizophrenia (HCC)   . Seizures (HCC)   . Shortness of breath     Past Surgical History:  Procedure Laterality Date  . FOOT SURGERY    . LEG SURGERY    . NASAL FRACTURE SURGERY    . TUBAL LIGATION  1996    Family Psychiatric History: Reviewed.  Family History:  Family History  Problem Relation Age of Onset  . Adopted: Yes    Social History:  Social History   Social History  . Marital status: Married    Spouse name: N/A  . Number of children: 2  . Years of education: N/A   Occupational History  . unemployed Not Employed   Social History  Main Topics  . Smoking status: Former Smoker    Packs/day: 1.50    Years: 30.00    Types: Cigarettes    Quit date: 09/20/2011  . Smokeless tobacco: Never Used  . Alcohol use No  . Drug use: No  . Sexual activity: Yes    Partners: Male    Birth control/ protection: Surgical   Other Topics Concern  . None   Social History Narrative   Pt is adopted. Unsure of family history.    Allergies:  Allergies  Allergen Reactions  . Tomato Rash    Pt reports that she breaks out in a rash if she eats tomato based foods but can eat a whole tomato  . Imitrex [Sumatriptan Base] Hives  . Mustard Seed Rash    Also allergic to [facility]     Metabolic Disorder Labs: Lab Results  Component Value Date   HGBA1C 5.5 06/08/2015   MPG 123 (H) 02/05/2012   MPG 114 09/22/2011   Lab Results  Component Value Date   PROLACTIN 39.1 (H) 06/08/2015   Lab Results  Component Value Date   CHOL 218 (H) 06/08/2015   TRIG 215 (H) 06/08/2015   HDL 36 (L) 06/08/2015   CHOLHDL 6.1 06/08/2015   VLDL 43 (H) 06/08/2015   LDLCALC 139 (H) 06/08/2015   LDLCALC UNABLE TO CALCULATE IF TRIGLYCERIDE OVER 400 mg/dL 40/02/2724   Lab Results  Component Value Date   TSH 1.782 06/08/2015   TSH 2.083 04/07/2013    Therapeutic Level Labs: Lab Results  Component Value Date   LITHIUM 0.70 06/12/2015   Lab Results  Component Value Date   VALPROATE 12.7 06/10/2015   VALPROATE 62 06/08/2015   No components found for:  CBMZ  Current Medications: Current Outpatient Prescriptions  Medication Sig Dispense Refill  . acetaminophen (TYLENOL 8 HOUR) 650 MG CR tablet Take 1 tablet (650 mg total) by mouth every 8 (eight) hours as needed for pain. (Patient not taking: Reported on 11/28/2016) 30 tablet 0  . aspirin EC 81 MG tablet Take 1 tablet (81 mg total) by mouth daily with breakfast. 30 tablet 0  . clonazePAM (KLONOPIN) 0.5 MG tablet Take 1 tablet (0.5 mg total) by mouth 3 (three) times daily as needed for anxiety. 90 tablet 1  . diltiazem (DILACOR XR) 120 MG 24 hr capsule Take 120 mg by mouth daily.    Marland Kitchen eletriptan (RELPAX) 40 MG tablet TAKE 1 TAB AT ONSET OF HEADACHES. MAY REPEAT IN 2 HOURS.MAX 2/24 HRS. FOR 30 DAYS  6  . folic acid (FOLVITE) 1 MG tablet Take 1 tablet (1 mg total) by mouth daily. 30 tablet 0  . ibuprofen (ADVIL,MOTRIN) 800 MG tablet Take 800 mg by mouth every 8 (eight) hours as needed (pain).    Marland Kitchen levothyroxine (SYNTHROID, LEVOTHROID) 75 MCG tablet Take 1 tablet (75 mcg total) by mouth daily before breakfast. 30 tablet 0  . lithium carbonate (ESKALITH) 450 MG CR tablet Take 2 tablets (900 mg total) by mouth daily. 60 tablet 1  .  loxapine (LOXITANE) 25 MG capsule Take 1 capsule (25 mg total) by mouth at bedtime. 30 capsule 1  . PARoxetine (PAXIL-CR) 37.5 MG 24 hr tablet Take 1 tablet (37.5 mg total) by mouth every morning. 90 tablet 0  . pravastatin (PRAVACHOL) 20 MG tablet Take 20 mg by mouth at bedtime.     . propranolol (INDERAL) 20 MG tablet Take 1 tablet (20 mg total) by mouth 2 (two) times daily. 180 tablet  0  . tiZANidine (ZANAFLEX) 4 MG tablet Take 4-8 mg by mouth at bedtime.   6  . topiramate (TOPAMAX) 50 MG tablet Take 100 mg by mouth 2 (two) times daily.    . vitamin B-12 (CYANOCOBALAMIN) 1000 MCG tablet Take 1,000 mcg by mouth daily.    . Vitamin D, Ergocalciferol, (DRISDOL) 50000 units CAPS capsule Take 50,000 Units by mouth every 7 (seven) days. saturday     No current facility-administered medications for this visit.      Musculoskeletal: Strength & Muscle Tone: within normal limits Gait & Station: normal Patient leans: N/A  Psychiatric Specialty Exam: ROS  Blood pressure 106/64, pulse 67, height  (1.803 m), weight 266 lb (120.7 kg).Body mass index is 37.1 kg/m.  General Appearance: Casual and Obese  Eye Contact:  Fair  Speech:  Rambling but coherent  Volume:  Normal  Mood:  Anxious  Affect:  Labile  Thought Process:  Descriptions of Associations: Circumstantial  Orientation:  Full (Time, Place, and Person)  Thought Content: Rumination   Suicidal Thoughts:  No  Homicidal Thoughts:  No  Memory:  Immediate;   Fair Recent;   Fair Remote;   Fair  Judgement:  Fair  Insight:  Good  Psychomotor Activity:  Increased  Concentration:  Concentration: Fair and Attention Span: Fair  Recall:  [employer] of Knowledge: Fair  Language: Good  Akathisia:  No  Handed:  Right  AIMS (if indicated): not done  Assets:  Communication Skills Desire for Improvement Housing  ADL's:  Intact  Cognition: WNL  Sleep:  Fair   Screenings: AUDIT     Admission (Discharged) from 06/07/2015 in [facility] ED to [facility]  Alcohol Use Disorder Identification Test Final Score (AUDIT)  0  0       Assessment and Plan: , Schizoaffective disorder Bipolar type.  Posttraumatic stress disorder.  Patient is doing better on her current medication.  She has insomnia but I explained that adding more medication may cause dizziness and fall.  Patient is having procedure on October 23 for cardiac ablation.  I encouraged to have her procedure first.  I also encouraged to continue counseling.  Overall her affect is improved from the past.  Continue Klonopin 0.5 mg 3 times a day, Loxitane 25 mg at bedtime, lithium 900 mg daily and Paxil 37.5 mg daily.  Encourage and reinforced to have lithium level next week.  Patient like to try CBD oil and I recommended to try but if noticed any change in the symptoms that she should call us immediately.  Recommended to call us if there is any worsening of symptoms.  Follow-up in 2 months.     Rosette Bellavance T., MD 01/27/2017, 1:03 PM

## 2017-01-28 ENCOUNTER — Telehealth (HOSPITAL_COMMUNITY): Payer: Self-pay | Admitting: *Deleted

## 2017-01-28 DIAGNOSIS — F4310 Post-traumatic stress disorder, unspecified: Secondary | ICD-10-CM

## 2017-01-28 MED ORDER — PROPRANOLOL HCL 20 MG PO TABS: 20.0000 mg | ORAL_TABLET | Freq: Two times a day (BID) | ORAL | 0 refills | Status: DC

## 2017-01-28 MED ORDER — PAROXETINE HCL ER 37.5 MG PO TB24: 37.5000 mg | ORAL_TABLET | Freq: Every morning | ORAL | 0 refills | Status: DC

## 2017-01-28 MED ORDER — LOXAPINE SUCCINATE 25 MG PO CAPS: 25.0000 mg | ORAL_CAPSULE | Freq: Every day | ORAL | 0 refills | Status: DC

## 2017-01-28 MED ORDER — LITHIUM CARBONATE ER 450 MG PO TBCR: 900.0000 mg | EXTENDED_RELEASE_TABLET | Freq: Every day | ORAL | 1 refills | Status: DC

## 2017-01-28 NOTE — Telephone Encounter (Signed)
Patient called stating that medication were sent to the wrong pharmacy. [facility] [place], asking that medications be sent there. Dr. Lolly Mustache agreed to allow RN to resubmit medication to the correct pharmacy.

## 2017-01-29 ENCOUNTER — Encounter (HOSPITAL_COMMUNITY): Payer: Self-pay | Admitting: Licensed Clinical Social Worker

## 2017-01-29 ENCOUNTER — Ambulatory Visit (INDEPENDENT_AMBULATORY_CARE_PROVIDER_SITE_OTHER): Payer: BLUE CROSS/BLUE SHIELD | Admitting: Licensed Clinical Social Worker

## 2017-01-29 DIAGNOSIS — F200 Paranoid schizophrenia: Secondary | ICD-10-CM | POA: Diagnosis not present

## 2017-01-29 DIAGNOSIS — F4310 Post-traumatic stress disorder, unspecified: Secondary | ICD-10-CM

## 2017-01-29 NOTE — Progress Notes (Signed)
   THERAPIST PROGRESS NOTE  Session Time: 4:30pm-5:30pm  Participation Level: Active  Behavioral Response: CasualAlertAnxious and Depressed  Type of Therapy: Individual Therapy  Treatment Goals addressed: Improve Psychiatric Symptoms, Emotional Regulation Skills, Calming Skills, Healthy Coping Skills, discuss and process traumatic event, reduced irrational worries and fears  Interventions: Motivational Interviewing, Cognitive Behavior Therapy and Grounding/Mindfulness Skills, psychoeducation  Summary: Danielle [name] is a 46 y.o. female who presents with Schizophrenia and PTSD.   Suicidal/Homicidal: No - without intent/plan  Therapist Response:  Hertha met with clinician for an individual session. [name] discussed her psychiatric symptoms, her current life events and her homework. [name] shared that she felt very sad because she re-homed her dog today. [name] processed thoughts and feelings, not only about the shock of how the re-homing went, but also the sadness that she did not get to have the goodbye she hoped for. [name] reports she knows the new home and owner will be caring and loving to her dog, but she feels sad. [name] reports multiple panic attacks per day, as well as difficulty sleeping at night. [name] reported restless sleep for several hours and then waking at 3am, falling back to sleep from about 5-7, and then being up all day. Clinician discussed opportunities for social interactions and provided information about [facility]. However, she is not a [name] resident, so she is not eligible for that program. Clinician agreed to follow up and find [name] resources for [name]. Clinician discussed the use of meditation and guided imagery.   Plan: Return again in 2 weeks.  Diagnosis:     Axis I: Schizophrenia, paranoid type and PTSD    [name], LCSW 01/29/2017

## 2017-02-04 ENCOUNTER — Telehealth (HOSPITAL_COMMUNITY): Payer: Self-pay

## 2017-02-04 NOTE — Telephone Encounter (Signed)
Patient is calling to give you an update. She has been taking CBD oil and has gone from 3 Klonopin a day down to 1 a day. She said she feels better, less anxious and not as depressed.

## 2017-02-17 ENCOUNTER — Encounter (HOSPITAL_COMMUNITY): Payer: Self-pay | Admitting: Licensed Clinical Social Worker

## 2017-02-17 ENCOUNTER — Other Ambulatory Visit (HOSPITAL_COMMUNITY): Payer: Self-pay

## 2017-02-17 ENCOUNTER — Ambulatory Visit (INDEPENDENT_AMBULATORY_CARE_PROVIDER_SITE_OTHER): Payer: BLUE CROSS/BLUE SHIELD | Admitting: Licensed Clinical Social Worker

## 2017-02-17 DIAGNOSIS — Z79899 Other long term (current) drug therapy: Secondary | ICD-10-CM

## 2017-02-17 DIAGNOSIS — F200 Paranoid schizophrenia: Secondary | ICD-10-CM

## 2017-02-17 DIAGNOSIS — F4310 Post-traumatic stress disorder, unspecified: Secondary | ICD-10-CM | POA: Diagnosis not present

## 2017-02-17 NOTE — Progress Notes (Signed)
   THERAPIST PROGRESS NOTE  Session Time: 4:30pm-5:30pm  Participation Level: Active  Behavioral Response: CasualAlertEuthymic  Type of Therapy: Individual Therapy  Treatment Goals addressed: Improve Psychiatric Symptoms, Emotional Regulation Skills, Calming Skills, Healthy Coping Skills, discuss and process traumatic event, reduced irrational worries and fears  Interventions: Motivational Interviewing, Cognitive Behavior Therapy and Grounding/Mindfulness Skills, psychoeducation  Summary: Danielle [name] is a 46 y.o. female who presents with Schizophrenia and PTSD.   Suicidal/Homicidal: No - without intent/plan  Therapist Response:  Danielle [name] met with clinician for an individual session. Danielle [name]'s daughter joined the session to support her. Danielle [name] discussed her psychiatric symptoms, her current life events and her homework. Danielle [name] shared that she has been worried about her children, particularly her son, who was recently arrested. Danielle [name] processed her thoughts and feelings, not only about the choices of her children, but also about her choices in parenting them. Danielle [name] and daughter identified certain parenting choices that daughter reported "babied" her brother. Danielle [name] agreed that she did baby him. Danielle [name] and daughter bantered throughout session and were light-hearted in their relationships.  Nurse met with Danielle [name] to discuss a need for Lithium levels to be included in labwork done by PCP. This order was completed and given to Danielle [name] at end of session.  Danielle [name] reports worry about upcoming procedure on her heart. Clinician processed these worries and identified ways to reduce worry by focusing on positives.  Clinician also provided resources for [facility] and Day Programs for Danielle [name] [facility] at [employer] and Danielle [name].   Plan: Return again in 2 weeks.  Diagnosis:     Axis I: Schizophrenia, paranoid type and PTSD    [name] 02/17/2017

## 2017-03-10 ENCOUNTER — Telehealth (HOSPITAL_COMMUNITY): Payer: Self-pay

## 2017-03-10 DIAGNOSIS — F4310 Post-traumatic stress disorder, unspecified: Secondary | ICD-10-CM

## 2017-03-10 NOTE — Telephone Encounter (Signed)
Okay to provide 90 day supply. 

## 2017-03-10 NOTE — Telephone Encounter (Signed)
Medication refill - [facility] [place] requesting a refill of patient's prescribed Lithium, stating patient's insurance will only pay for 90 day orders.

## 2017-03-11 MED ORDER — LITHIUM CARBONATE ER 450 MG PO TBCR: 900.0000 mg | EXTENDED_RELEASE_TABLET | Freq: Every day | ORAL | 0 refills | Status: DC

## 2017-03-11 NOTE — Telephone Encounter (Signed)
A new 90 day Lithium Carbonate (Eskalith) 450 mg, take 2 tablets daily by mouth daily, total of 900 mg with no refills e-scribed to [facility] [place] per verbal approval of Dr. Lolly MustacheArfeen this date.

## 2017-03-12 ENCOUNTER — Ambulatory Visit (HOSPITAL_COMMUNITY): Payer: Self-pay | Admitting: Licensed Clinical Social Worker

## 2017-03-12 NOTE — Progress Notes (Unsigned)
   THERAPIST PROGRESS NOTE  Session Time: ***  Participation Level: {BHH PARTICIPATION LEVEL:22264}  Behavioral Response: {Appearance:22683}{BHH LEVEL OF CONSCIOUSNESS:22305}{BHH MOOD:22306}  Type of Therapy: Individual Therapy  Treatment Goals addressed: Improve Psychiatric Symptoms, Emotional Regulation Skills, Calming Skills, Healthy Coping Skills, discuss and process traumatic event, reduced irrational worries and fears  Interventions: Motivational Interviewing, Cognitive Behavior Therapy and Grounding/Mindfulness Skills, psychoeducation  Summary: Danielle [name] is a 46 y.o. female who presents with Schizophrenia and PTSD.   Suicidal/Homicidal: No - without intent/plan  Therapist Response:  Apoorva met with clinician for an individual session. [name]'s husband joined the session to support her. [name] discussed her psychiatric symptoms, her current life events and her homework. [name] shared that   Plan: Return again in 2 weeks.  Diagnosis:     Axis I: Schizophrenia, paranoid type and PTSD  [name] 03/12/2017

## 2017-04-01 ENCOUNTER — Ambulatory Visit (INDEPENDENT_AMBULATORY_CARE_PROVIDER_SITE_OTHER): Payer: BLUE CROSS/BLUE SHIELD | Admitting: Psychiatry

## 2017-04-01 ENCOUNTER — Encounter (HOSPITAL_COMMUNITY): Payer: Self-pay | Admitting: Psychiatry

## 2017-04-01 DIAGNOSIS — I471 Supraventricular tachycardia: Secondary | ICD-10-CM | POA: Diagnosis not present

## 2017-04-01 DIAGNOSIS — F419 Anxiety disorder, unspecified: Secondary | ICD-10-CM

## 2017-04-01 DIAGNOSIS — Z79899 Other long term (current) drug therapy: Secondary | ICD-10-CM | POA: Diagnosis not present

## 2017-04-01 DIAGNOSIS — F250 Schizoaffective disorder, bipolar type: Secondary | ICD-10-CM | POA: Diagnosis not present

## 2017-04-01 DIAGNOSIS — Z915 Personal history of self-harm: Secondary | ICD-10-CM

## 2017-04-01 DIAGNOSIS — Z87891 Personal history of nicotine dependence: Secondary | ICD-10-CM | POA: Diagnosis not present

## 2017-04-01 DIAGNOSIS — Z9181 History of falling: Secondary | ICD-10-CM | POA: Diagnosis not present

## 2017-04-01 DIAGNOSIS — F4310 Post-traumatic stress disorder, unspecified: Secondary | ICD-10-CM | POA: Diagnosis not present

## 2017-04-01 MED ORDER — PAROXETINE HCL ER 37.5 MG PO TB24: 37.5000 mg | ORAL_TABLET | Freq: Every morning | ORAL | 0 refills | Status: DC

## 2017-04-01 MED ORDER — CLONAZEPAM 0.5 MG PO TABS: 0.5000 mg | ORAL_TABLET | Freq: Three times a day (TID) | ORAL | 1 refills | Status: DC | PRN

## 2017-04-01 MED ORDER — LITHIUM CARBONATE ER 450 MG PO TBCR: 900.0000 mg | EXTENDED_RELEASE_TABLET | Freq: Every day | ORAL | 0 refills | Status: DC

## 2017-04-01 MED ORDER — LOXAPINE SUCCINATE 25 MG PO CAPS: 25.0000 mg | ORAL_CAPSULE | Freq: Every day | ORAL | 0 refills | Status: DC

## 2017-04-01 NOTE — Progress Notes (Signed)
BH MD/PA/NP OP Progress Note  04/01/2017 4:32 PM Danielle [name]  MRN:  [id number]  Chief Complaint: I am in rehab.  I fell and got bruise and the doctor sent me for rehab.  HPI: Patient came for her follow-up appointment with her husband.  She is currently in rehab for past few weeks.  She is not sure why she was sent to rehab but remember she fell and got bruises and she had difficulty in her leg.  She was seen at urgent care who recommended rehab.  She is doing physical therapy.  She also have cardiac ablation for SVT.  Since then she has no more dizziness but she is not sure how she fell.  She believes she slipped and did not have any vertigo or dizziness.  Overall she is feeling better.  She is using CBD oil which is helping her anxiety.  She still have nightmares.  She is no longer taking propanolol.  At the rehab center she was given Minipress but she is not sure about the doses.  She also mention that she has a lithium level at rehab center but she does not know the results.  She is compliant with Klonopin, Loxitane, lithium and Paxil.  She has no tremors or shakes.  Her affect is improved from the past.  She denies any hallucination or any paranoia.  She admitted weight gain which she believed due to lack of mobilization.  She is using walker because she feels weak and difficulty walking.  Patient denies any suicidal thoughts or homicidal thoughts.  Visit Diagnosis:    ICD-10-CM   1. PTSD (post-traumatic stress disorder) F43.10 lithium carbonate (ESKALITH) 450 MG CR tablet    clonazePAM (KLONOPIN) 0.5 MG tablet    PARoxetine (PAXIL-CR) 37.5 MG 24 hr tablet    loxapine (LOXITANE) 25 MG capsule    Past Psychiatric History: Reviewed. Patient has multiple hospitalization do to psychosis, paranoia, hallucination, suicidal attempt, extreme panic attack and severe depression. Her last admission was in April 2018 at old [facility]. She has at least 4 suicidal attempt. She has history of  cutting herself and drowning herself. In the past she has tried multiple psychiatric medication with limited response. She has taken Zoloft, amitriptyline, Wellbutrin, Latuda, Xanax, Valium, Risperdal, lithium, Haldol, Depakote, Zyprexa, Geodon and recently Lamictal. At her recent hospitalization at old [facility], she was started on Paxil, temazepam, imipramine and Invega.   Past Medical History:  Past Medical History:  Diagnosis Date  . Anemia   . Anxiety   . Bipolar 1 disorder (HCC)   . Depressed   . Headache(784.0)   . Heart murmur   . Hypothyroidism   . PTSD (post-traumatic stress disorder)   . Schizophrenia (HCC)   . Seizures (HCC)   . Shortness of breath     Past Surgical History:  Procedure Laterality Date  . FOOT SURGERY    . LEG SURGERY    . NASAL FRACTURE SURGERY    . TUBAL LIGATION  1996    Family Psychiatric History: Reviewed.  Family History:  Family History  Adopted: Yes    Social History:  Social History   Socioeconomic History  . Marital status: Married    Spouse name: None  . Number of children: 2  . Years of education: None  . Highest education level: None  Social Needs  . Financial resource strain: None  . Food insecurity - worry: None  . Food insecurity - inability: None  . Transportation needs -  medical: None  . Transportation needs - non-medical: None  Occupational History  . 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 Smoking status: Former Smoker    Packs/day: 1.50    Years: 30.00    Pack years: 45.00    Types: Cigarettes    Last attempt to quit: 09/20/2011    Years since quitting: 5.5  . Smokeless tobacco: Current User  Substance and Sexual Activity  . Alcohol use: No    Alcohol/week: 0.0 oz  . Drug use: No  . Sexual activity: Yes    Partners: Male    Birth control/protection: Surgical  Other Topics Concern  . None  Social History Narrative   Pt is adopted. Unsure of family history.    Allergies:   Allergies  Allergen Reactions  . Tomato Rash    Pt reports that she breaks out in a rash if she eats tomato based foods but can eat a whole tomato  . Imitrex [Sumatriptan Base] Hives  . Mustard Seed Rash    Also [facility] to [facility]    Metabolic Disorder Labs: Lab Results  Component Value Date   HGBA1C 5.5 06/08/2015   MPG 123 (H) 02/05/2012   MPG 114 09/22/2011   Lab Results  Component Value Date   PROLACTIN 39.1 (H) 06/08/2015   Lab Results  Component Value Date   CHOL 218 (H) 06/08/2015   TRIG 215 (H) 06/08/2015   HDL 36 (L) 06/08/2015   CHOLHDL 6.1 06/08/2015   VLDL 43 (H) 06/08/2015   LDLCALC 139 (H) 06/08/2015   LDLCALC UNABLE TO CALCULATE IF TRIGLYCERIDE OVER 400 mg/dL 78/29/562105/19/2013   Lab Results  Component Value Date   TSH 1.782 06/08/2015   TSH 2.083 04/07/2013    Therapeutic Level Labs: Lab Results  Component Value Date   LITHIUM 0.70 06/12/2015   Lab Results  Component Value Date   VALPROATE 12.7 06/10/2015   VALPROATE 62 06/08/2015   No components found for:  CBMZ  Current Medications: Current Outpatient Medications  Medication Sig Dispense Refill  . acetaminophen (TYLENOL 8 HOUR) 650 MG CR tablet Take 1 tablet (650 mg total) by mouth every 8 (eight) hours as needed for pain. 30 tablet 0  . aspirin EC 81 MG tablet Take 1 tablet (81 mg total) by mouth daily with breakfast. 30 tablet 0  . clonazePAM (KLONOPIN) 0.5 MG tablet Take 1 tablet (0.5 mg total) by mouth 3 (three) times daily as needed for anxiety. 90 tablet 1  . diltiazem (DILACOR XR) 120 MG 24 hr capsule Take 120 mg by mouth daily.    Marland Kitchen. eletriptan (RELPAX) 40 MG tablet TAKE 1 TAB AT ONSET OF HEADACHES. MAY REPEAT IN 2 HOURS.MAX 2/24 HRS. FOR 30 DAYS  6  . folic acid (FOLVITE) 1 MG tablet Take 1 tablet (1 mg total) by mouth daily. 30 tablet 0  . ibuprofen (ADVIL,MOTRIN) 800 MG tablet Take 800 mg by mouth every 8 (eight) hours as needed (pain).    Marland Kitchen. levothyroxine (SYNTHROID, LEVOTHROID) 75 MCG  tablet Take 1 tablet (75 mcg total) by mouth daily before breakfast. 30 tablet 0  . lithium carbonate (ESKALITH) 450 MG CR tablet Take 2 tablets (900 mg total) daily by mouth. 180 tablet 0  . loxapine (LOXITANE) 25 MG capsule Take 1 capsule (25 mg total) by mouth at bedtime. 90 capsule 0  . PARoxetine (PAXIL-CR) 37.5 MG 24 hr tablet Take 1 tablet (37.5 mg total) by mouth every morning. 90  tablet 0  . propranolol (INDERAL) 20 MG tablet Take 1 tablet (20 mg total) by mouth 2 (two) times daily. 180 tablet 0  . tiZANidine (ZANAFLEX) 4 MG tablet Take 4-8 mg by mouth at bedtime.   6  . topiramate (TOPAMAX) 50 MG tablet Take 100 mg by mouth 2 (two) times daily.    . vitamin B-12 (CYANOCOBALAMIN) 1000 MCG tablet Take 1,000 mcg by mouth daily.    . Vitamin D, Ergocalciferol, (DRISDOL) 50000 units CAPS capsule Take 50,000 Units by mouth every 7 (seven) days. saturday     No current facility-administered medications for this visit.      Musculoskeletal: Strength & Muscle Tone: decreased Gait & Station: unsteady Patient leans: Using walker  Psychiatric Specialty Exam: Review of Systems  Constitutional: Negative for weight loss.  HENT: Negative.   Eyes: Negative.   Respiratory: Negative.   Cardiovascular: Negative.   Genitourinary: Negative.   Musculoskeletal: Positive for back pain and joint pain.  Skin: Negative.   Neurological: Positive for weakness and headaches.  Psychiatric/Behavioral: The patient is nervous/anxious.     Blood pressure 126/72, pulse 62, height 5\' 11"  (1.803 m), weight 270 lb 9.6 oz (122.7 kg).Body mass index is 37.74 kg/m.  General Appearance: Casual and Overweight  Eye Contact:  Fair  Speech:  Fast but coherent  Volume:  Normal  Mood:  Anxious  Affect:  Labile  Thought Process:  Descriptions of Associations: Circumstantial  Orientation:  Full (Time, Place, and Person)  Thought Content: Rumination   Suicidal Thoughts:  No  Homicidal Thoughts:  No  Memory:   Immediate;   Fair Recent;   Fair Remote;   Fair  Judgement:  Good  Insight:  Good  Psychomotor Activity:  Decreased  Concentration:  Concentration: Fair and Attention Span: Fair  Recall:  [employer]Fair  Fund of Knowledge: Good  Language: Good  Akathisia:  No  Handed:  Right  AIMS (if indicated): not done  Assets:  Communication Skills Desire for Improvement Housing Resilience  ADL's:  Intact  Cognition: WNL  Sleep:  Fair   Screenings: AUDIT     [facility]  Alcohol Use Disorder Identification Test Final Score (AUDIT)  0  0       Assessment and Plan: Schizoaffective disorder bipolar type.  Posttraumatic stress disorder.  Anxiety disorder NOS.  I reviewed records from other providers including cardiology.  Patient has SVT status post ablation on October 25.  She is doing much better but recently she is in rehab due to syncopal episode and fall.  She still have nightmares and flashback.  Recently she was given Minipress but does not clear.  She is no longer taking propranolol.  We will call rehab center to reconcile the medication.  Patient doing much better with her psychiatric medication.  I will continue Paxil 37.5 mg daily, Loxitane 25 mg at bedtime, lithium 900 mg daily and Klonopin 0.5 mg 3 times a day.  Patient told she had a blood work done at rehab center and we will get a lithium level.  Encouraged to continue counseling with Shanda BumpsJessica.  Recommended to call us back if she has any question, concern or if you feel worsening of the symptoms.  Time spent 25 minutes.  More than 50% of the time spent in psychoeducation, counseling and coordination of care.  Patient was given enough time to ask questions which  were addressed and it appears that patient and has been satisfied with the answer.  Follow-up in 2-3 months.   Cleotis Nipper,  MD 04/01/2017, 4:32 PM

## 2017-04-17 ENCOUNTER — Ambulatory Visit (INDEPENDENT_AMBULATORY_CARE_PROVIDER_SITE_OTHER): Payer: BLUE CROSS/BLUE SHIELD | Admitting: Licensed Clinical Social Worker

## 2017-04-17 ENCOUNTER — Encounter (HOSPITAL_COMMUNITY): Payer: Self-pay | Admitting: Licensed Clinical Social Worker

## 2017-04-17 DIAGNOSIS — F200 Paranoid schizophrenia: Secondary | ICD-10-CM | POA: Diagnosis not present

## 2017-04-17 DIAGNOSIS — F4310 Post-traumatic stress disorder, unspecified: Secondary | ICD-10-CM | POA: Diagnosis not present

## 2017-04-17 DIAGNOSIS — F3162 Bipolar disorder, current episode mixed, moderate: Secondary | ICD-10-CM | POA: Diagnosis not present

## 2017-04-17 NOTE — Progress Notes (Signed)
   THERAPIST PROGRESS NOTE  Session Time: 1:30pm-2:00pm  Participation Level: Active  Behavioral Response: CasualConfusedDepressed  Type of Therapy: Family Therapy  Treatment Goals addressed: Improve Psychiatric Symptoms, Emotional Regulation Skills, Calming Skills, Healthy Coping Skills, discuss and process traumatic event, reduced irrational worries and fears  Interventions: Motivational Interviewing, Cognitive Behavior Therapy and Grounding/Mindfulness Skills, psychoeducation  Summary: OZA OBERLE is a 46 y.o. female who presents with Schizophrenia and PTSD.   Suicidal/Homicidal: No - without intent/plan  Therapist Response:  Erinne met with clinician for a family session. Tasheka's husband joined the session to support her. Greyson discussed her psychiatric symptoms, her current life events and her homework. Ryin shared that she has been in and out of the hospital for heart and physical concerns over the past 2 months. She has been having more "pseudoseizures", which are now accompanied by memory loss. Daleiza also spent 21 days in a rehab center for assistance with balance after a fall.  Lataja's husband reports she passed out last night and has not been able to remember anything prior to last night. Clinician spoke with Jaqlyn about her concerns regarding medications, provided psychoeducation about reasons for the medications, as well as a brief explanation of diagnoses. Kabria also had some questions about her family, which her husband was able to answer. Oliviya became upset when discussing her children and reported that she was ready to end the session.   Plan: Return again in 2 weeks.  Diagnosis:     Axis I: Schizophrenia, paranoid type and PTSD   [name], LCSW 04/17/2017

## 2017-06-02 ENCOUNTER — Ambulatory Visit (HOSPITAL_COMMUNITY): Payer: Self-pay | Admitting: Licensed Clinical Social Worker

## 2017-06-10 ENCOUNTER — Other Ambulatory Visit (HOSPITAL_COMMUNITY): Payer: Self-pay

## 2017-06-10 DIAGNOSIS — F4310 Post-traumatic stress disorder, unspecified: Secondary | ICD-10-CM

## 2017-06-10 MED ORDER — CLONAZEPAM 0.5 MG PO TABS: 0.5000 mg | ORAL_TABLET | Freq: Three times a day (TID) | ORAL | 0 refills | Status: DC | PRN

## 2017-06-10 MED ORDER — LOXAPINE SUCCINATE 25 MG PO CAPS: 25.0000 mg | ORAL_CAPSULE | Freq: Every day | ORAL | 0 refills | Status: DC

## 2017-06-25 ENCOUNTER — Ambulatory Visit (INDEPENDENT_AMBULATORY_CARE_PROVIDER_SITE_OTHER): Payer: BLUE CROSS/BLUE SHIELD | Admitting: Psychiatry

## 2017-06-25 ENCOUNTER — Encounter (HOSPITAL_COMMUNITY): Payer: Self-pay | Admitting: Psychiatry

## 2017-06-25 VITALS — BP 133/88 | HR 80 | Ht 71.0 in | Wt 266.2 lb

## 2017-06-25 DIAGNOSIS — G4700 Insomnia, unspecified: Secondary | ICD-10-CM | POA: Diagnosis not present

## 2017-06-25 DIAGNOSIS — Z915 Personal history of self-harm: Secondary | ICD-10-CM

## 2017-06-25 DIAGNOSIS — M549 Dorsalgia, unspecified: Secondary | ICD-10-CM | POA: Diagnosis not present

## 2017-06-25 DIAGNOSIS — F419 Anxiety disorder, unspecified: Secondary | ICD-10-CM | POA: Diagnosis not present

## 2017-06-25 DIAGNOSIS — F4310 Post-traumatic stress disorder, unspecified: Secondary | ICD-10-CM | POA: Diagnosis not present

## 2017-06-25 DIAGNOSIS — R450 Nervousness: Secondary | ICD-10-CM

## 2017-06-25 DIAGNOSIS — R251 Tremor, unspecified: Secondary | ICD-10-CM

## 2017-06-25 DIAGNOSIS — Z560 Unemployment, unspecified: Secondary | ICD-10-CM | POA: Diagnosis not present

## 2017-06-25 DIAGNOSIS — M2550 Pain in unspecified joint: Secondary | ICD-10-CM | POA: Diagnosis not present

## 2017-06-25 DIAGNOSIS — F515 Nightmare disorder: Secondary | ICD-10-CM | POA: Diagnosis not present

## 2017-06-25 DIAGNOSIS — Z87891 Personal history of nicotine dependence: Secondary | ICD-10-CM | POA: Diagnosis not present

## 2017-06-25 MED ORDER — PRAZOSIN HCL 2 MG PO CAPS: 2.0000 mg | ORAL_CAPSULE | Freq: Every day | ORAL | 0 refills | Status: DC

## 2017-06-25 MED ORDER — TRAZODONE HCL 50 MG PO TABS: 50.0000 mg | ORAL_TABLET | Freq: Every day | ORAL | 1 refills | Status: DC

## 2017-06-25 MED ORDER — PAROXETINE HCL ER 37.5 MG PO TB24: 37.5000 mg | ORAL_TABLET | Freq: Every morning | ORAL | 0 refills | Status: DC

## 2017-06-25 MED ORDER — CLONAZEPAM 0.5 MG PO TABS: 0.5000 mg | ORAL_TABLET | Freq: Three times a day (TID) | ORAL | 0 refills | Status: DC | PRN

## 2017-06-25 MED ORDER — LITHIUM CARBONATE ER 450 MG PO TBCR: 900.0000 mg | EXTENDED_RELEASE_TABLET | Freq: Every day | ORAL | 0 refills | Status: DC

## 2017-06-25 MED ORDER — LOXAPINE SUCCINATE 25 MG PO CAPS: 25.0000 mg | ORAL_CAPSULE | Freq: Every day | ORAL | 0 refills | Status: DC

## 2017-06-25 MED ORDER — CLONAZEPAM 0.5 MG PO TABS: 0.5000 mg | ORAL_TABLET | Freq: Three times a day (TID) | ORAL | 1 refills | Status: DC | PRN

## 2017-06-25 NOTE — Progress Notes (Signed)
BH MD/PA/NP OP Progress Note  06/25/2017 1:46 PM Luna FuseMindy S Suk  MRN:  [id number]  Chief Complaint: I am doing better but I still have insomnia.  I am taking Minipress 2 mg which is helping my nightmares.  HPI: Patient came for her follow-up appointment with her husband.  She discharged from rehab center.  She is taking her medication as prescribed but continued to have insomnia.  She is anxious about upcoming surgery for her leg.  Patient was told that her rod which was inserted may need to removed because causing swelling and pain.  Patient is pleased that she has no more dizziness since she has cardiac ablation.  She is taking Minipress 2 mg which is helping her nightmares and flashback.  She has chronic paranoia but denies any recent suicidal thoughts or homicidal thought.  She is seeing Shanda BumpsJessica but she is upset as she is unable to see Shanda BumpsJessica every week.  She has appointment next week and if she cannot get frequent appointment then she is considering to see a therapist where she can see counselor more frequently.  She is no longer using walker.  Her appetite is fair.  She lost 4 pounds since the last visit.  She is taking lithium and her last lithium level was drawn on January 26 by her primary care physician which was 1.35.  Patient does not have any toxicity of lithium.  She has mild tremors but denies any polydipsia.  Her BUN was 13 and creatinine 0.83.  She denies any agitation, anger or any self abusive behavior.  Visit Diagnosis:    ICD-10-CM   1. PTSD (post-traumatic stress disorder) F43.10 PARoxetine (PAXIL-CR) 37.5 MG 24 hr tablet    loxapine (LOXITANE) 25 MG capsule    lithium carbonate (ESKALITH) 450 MG CR tablet    traZODone (DESYREL) 50 MG tablet    clonazePAM (KLONOPIN) 0.5 MG tablet    DISCONTINUED: clonazePAM (KLONOPIN) 0.5 MG tablet    Past Psychiatric History: Reviewed. Patient has multiple hospitalization do to psychosis, paranoia, hallucination, suicidal attempt, extreme  panic attack and severe depression. Her last admission was in April 2018 at [facility]. She has at least 4 suicidal attempt. She has history of cutting herself and drowning herself. In the past she has tried multiple psychiatric medication with limited response. She has taken Zoloft, amitriptyline, Wellbutrin, Latuda, Xanax, Valium, Risperdal, lithium, Haldol, Depakote, Zyprexa, Geodon and recently Lamictal. At her recent hospitalization at [facility], she was started on Paxil, temazepam, imipramine and Invega.   Past Medical History:  Past Medical History:  Diagnosis Date  . Anemia   . Anxiety   . Bipolar 1 disorder (HCC)   . Depressed   . Headache(784.0)   . Heart murmur   . Hypothyroidism   . PTSD (post-traumatic stress disorder)   . Schizophrenia (HCC)   . Seizures (HCC)   . Shortness of breath     Past Surgical History:  Procedure Laterality Date  . FOOT SURGERY    . LEG SURGERY    . NASAL FRACTURE SURGERY    . TUBAL LIGATION  1996    Family Psychiatric History: Viewed.  Family History:  Family History  Adopted: Yes    Social History:  Social History   Socioeconomic History  . Marital status: Married    Spouse name: Not on file  . Number of children: 2  . Years of education: Not on file  . Highest education level: Not on file  Social Needs  .  Financial resource strain: Not on file  . Food insecurity - worry: Not on file  . Food insecurity - inability: Not on file  . Transportation needs - medical: Not on file  . Transportation needs - non-medical: Not on file  Occupational History  . Occupation: unemployed    Associate Professor: NOT EMPLOYED  Tobacco Use  . Smoking status: Former Smoker    Packs/day: 1.50    Years: 30.00    Pack years: 45.00    Types: Cigarettes    Last attempt to quit: 09/20/2011    Years since quitting: 5.7  . Smokeless tobacco: Current User  Substance and Sexual Activity  . Alcohol use: No    Alcohol/week: 0.0 oz  . Drug  use: No  . Sexual activity: Yes    Partners: Male    Birth control/protection: Surgical  Other Topics Concern  . Not on file  Social History Narrative   Pt is adopted. Unsure of family history.    Allergies:  Allergies  Allergen Reactions  . Tomato Rash    Pt reports that she breaks out in a rash if she eats tomato based foods but can eat a whole tomato  . Imitrex [Sumatriptan Base] Hives  . Mustard Seed Rash    Also allergic to [facility]    Metabolic Disorder Labs: Lab Results  Component Value Date   HGBA1C 5.5 06/08/2015   MPG 123 (H) 02/05/2012   MPG 114 09/22/2011   Lab Results  Component Value Date   PROLACTIN 39.1 (H) 06/08/2015   Lab Results  Component Value Date   CHOL 218 (H) 06/08/2015   TRIG 215 (H) 06/08/2015   HDL 36 (L) 06/08/2015   CHOLHDL 6.1 06/08/2015   VLDL 43 (H) 06/08/2015   LDLCALC 139 (H) 06/08/2015   LDLCALC UNABLE TO CALCULATE IF TRIGLYCERIDE OVER 400 mg/dL 16/02/9603   Lab Results  Component Value Date   TSH 1.782 06/08/2015   TSH 2.083 04/07/2013    Therapeutic Level Labs: Lab Results  Component Value Date   LITHIUM 0.70 06/12/2015   Lab Results  Component Value Date   VALPROATE 12.7 06/10/2015   VALPROATE 62 06/08/2015   No components found for:  CBMZ  Current Medications: Current Outpatient Medications  Medication Sig Dispense Refill  . acetaminophen (TYLENOL 8 HOUR) 650 MG CR tablet Take 1 tablet (650 mg total) by mouth every 8 (eight) hours as needed for pain. 30 tablet 0  . aspirin EC 81 MG tablet Take 1 tablet (81 mg total) by mouth daily with breakfast. 30 tablet 0  . clonazePAM (KLONOPIN) 0.5 MG tablet Take 1 tablet (0.5 mg total) by mouth 3 (three) times daily as needed for anxiety. 90 tablet 0  . diltiazem (DILACOR XR) 120 MG 24 hr capsule Take 120 mg by mouth daily.    Marland Kitchen eletriptan (RELPAX) 40 MG tablet TAKE 1 TAB AT ONSET OF HEADACHES. MAY REPEAT IN 2 HOURS.MAX 2/24 HRS. FOR 30 DAYS  6  . folic acid (FOLVITE) 1 MG  tablet Take 1 tablet (1 mg total) by mouth daily. 30 tablet 0  . ibuprofen (ADVIL,MOTRIN) 800 MG tablet Take 800 mg by mouth every 8 (eight) hours as needed (pain).    Marland Kitchen levothyroxine (SYNTHROID, LEVOTHROID) 75 MCG tablet Take 1 tablet (75 mcg total) by mouth daily before breakfast. 30 tablet 0  . lithium carbonate (ESKALITH) 450 MG CR tablet Take 2 tablets (900 mg total) by mouth daily. 180 tablet 0  . loxapine (LOXITANE) 25  MG capsule Take 1 capsule (25 mg total) by mouth at bedtime. 30 capsule 0  . PARoxetine (PAXIL-CR) 37.5 MG 24 hr tablet Take 1 tablet (37.5 mg total) by mouth every morning. 90 tablet 0  . prazosin (MINIPRESS) 1 MG capsule Take 1 mg by mouth at bedtime.    . topiramate (TOPAMAX) 50 MG tablet Take 100 mg by mouth 2 (two) times daily.    . vitamin B-12 (CYANOCOBALAMIN) 1000 MCG tablet Take 1,000 mcg by mouth daily.    . Vitamin D, Ergocalciferol, (DRISDOL) 50000 units CAPS capsule Take 50,000 Units by mouth every 7 (seven) days. saturday     No current facility-administered medications for this visit.      Musculoskeletal: Strength & Muscle Tone: within normal limits Gait & Station: normal Patient leans: N/A  Psychiatric Specialty Exam: Review of Systems  Constitutional: Negative for weight loss.  Musculoskeletal: Positive for back pain and joint pain.  Skin: Negative.   Psychiatric/Behavioral: The patient is nervous/anxious.     Blood pressure 133/88, pulse 80, height 5\' 11"  (1.803 m), weight 266 lb 3.2 oz (120.7 kg).There is no height or weight on file to calculate BMI.  General Appearance: Casual and Overweight.  Eye Contact:  Fair  Speech:  Fast and rambling  Volume:  Normal  Mood:  Anxious  Affect:  Congruent  Thought Process:  Descriptions of Associations: Circumstantial  Orientation:  Full (Time, Place, and Person)  Thought Content: Paranoid Ideation and Rumination   Suicidal Thoughts:  No  Homicidal Thoughts:  No  Memory:  Immediate;   Fair Recent;    Fair Remote;   Fair  Judgement:  Good  Insight:  Good  Psychomotor Activity:  Decreased  Concentration:  Concentration: Fair and Attention Span: Fair  Recall:  [employer] of Knowledge: Good  Language: Good  Akathisia:  No  Handed:  Right  AIMS (if indicated): not done  Assets:  Communication Skills Desire for Improvement Housing Resilience Social Support  ADL's:  Intact  Cognition: WNL  Sleep:  Fair   Screenings: AUDIT     [facility]  Alcohol Use Disorder Identification Test Final Score (AUDIT)  0  0       Assessment and Plan: Schizoaffective disorder bipolar type.  Posttraumatic stress disorder.  Anxiety disorder NOS.  I reviewed records from her primary care physician including blood work results.  Her lithium level was 1.35 which was done on January 26.  Her BUN is 13 and creatinine 0.83.  Patient has no tremors or any lithium toxicity.  We will repeat the lithium level tomorrow morning.  Her condition is overall improved but she still have insomnia.  In the past she had a good response with trazodone.  I recommended to restart trazodone 50 mg at bedtime however if she started to have dizziness then she need to stop the medication immediately.  I also believe she should see therapist regularly and if some reason cannot see more frequently then she should see a therapist out of [facility] system where she can get frequent appointments.  Recommended to call us back if she has any question or any concern.  Follow-up in 2 months.  Time spent 25 minutes.  More than 50% of the time spent in psychoeducation, counseling and coordination of care.   Cleotis Nipper, MD 06/25/2017, 1:46 PM

## 2017-06-26 ENCOUNTER — Encounter (HOSPITAL_COMMUNITY): Payer: Self-pay | Admitting: Licensed Clinical Social Worker

## 2017-06-26 ENCOUNTER — Ambulatory Visit (HOSPITAL_COMMUNITY): Payer: BLUE CROSS/BLUE SHIELD | Admitting: Licensed Clinical Social Worker

## 2017-06-26 ENCOUNTER — Ambulatory Visit (HOSPITAL_COMMUNITY): Payer: BLUE CROSS/BLUE SHIELD

## 2017-06-26 ENCOUNTER — Telehealth (HOSPITAL_COMMUNITY): Payer: Self-pay

## 2017-06-26 DIAGNOSIS — F4310 Post-traumatic stress disorder, unspecified: Secondary | ICD-10-CM

## 2017-06-26 DIAGNOSIS — F200 Paranoid schizophrenia: Secondary | ICD-10-CM
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
## 2017-06-26 NOTE — Telephone Encounter (Signed)
Medication management - Met with patient and her husband to discuss Dr. Marguerite Olea order for a lithium level and HgbA1C labs today. Pt. reported past problems with lab draws. Discussed with Dr. Adele Schilder so patient will go to [facility] today for ordered labs with husband after therapy session this morning.  Dr. Adele Schilder provided order for both labs and patient going to [place] entered incorrectly for [facility] draw and then changed to [place].  Patient's husband, [name] will call if any problems once they get to [facility]. Dr. Adele Schilder signed paper copy of orders for both labs and these were given to patient and her husband to take with them to [facility] this morning.

## 2017-06-27 ENCOUNTER — Telehealth (HOSPITAL_COMMUNITY): Payer: Self-pay | Admitting: Psychiatry

## 2017-06-27 LAB — HEMOGLOBIN A1C
ESTIMATED AVERAGE GLUCOSE: 123 mg/dL
HEMOGLOBIN A1C: 5.9 % — AB (ref 4.8–5.6)

## 2017-06-27 LAB — LITHIUM LEVEL: Lithium Lvl: 1.3 mmol/L — ABNORMAL HIGH (ref 0.6–1.2)

## 2017-06-27 NOTE — Telephone Encounter (Signed)
I called patient's husband and discussed her lithium level which is 1.3.  Even though patient has no toxicity but it is above therapeutic level.  I recommended to reduce lithium to 1 tablet 450 mg daily.  If symptoms get worse we will add 300 mg in the future.

## 2017-06-30 ENCOUNTER — Telehealth (HOSPITAL_COMMUNITY): Payer: Self-pay

## 2017-06-30 NOTE — Telephone Encounter (Signed)
Patients husband called and said that patient could no take the Trazodone and is having visual hallucinations. Patients husband wants you to call him, [phone]

## 2017-06-30 NOTE — Telephone Encounter (Signed)
Returned patient's phone call.  She is no longer taking trazodone because of dizziness and occasional visual hallucination.  Recommended to discontinue trazodone completely and call us back if sleep does not improve

## 2017-07-03 ENCOUNTER — Ambulatory Visit (HOSPITAL_COMMUNITY): Payer: Self-pay | Admitting: Psychiatry

## 2017-07-17 ENCOUNTER — Other Ambulatory Visit (HOSPITAL_COMMUNITY): Payer: Self-pay

## 2017-07-17 DIAGNOSIS — F4310 Post-traumatic stress disorder, unspecified: Secondary | ICD-10-CM

## 2017-07-17 MED ORDER — LOXAPINE SUCCINATE 25 MG PO CAPS: 25.0000 mg | ORAL_CAPSULE | Freq: Every day | ORAL | 0 refills | Status: DC

## 2017-07-30 ENCOUNTER — Ambulatory Visit (INDEPENDENT_AMBULATORY_CARE_PROVIDER_SITE_OTHER): Payer: BLUE CROSS/BLUE SHIELD | Admitting: Licensed Clinical Social Worker

## 2017-07-30 ENCOUNTER — Encounter (HOSPITAL_COMMUNITY): Payer: Self-pay | Admitting: Licensed Clinical Social Worker

## 2017-07-30 DIAGNOSIS — F4310 Post-traumatic stress disorder, unspecified: Secondary | ICD-10-CM | POA: Diagnosis not present

## 2017-07-30 DIAGNOSIS — F200 Paranoid schizophrenia: Secondary | ICD-10-CM | POA: Diagnosis not present

## 2017-07-30 NOTE — Progress Notes (Signed)
   THERAPIST PROGRESS NOTE  Session Time: 8:00am-8:45am  Participation Level: Active  Behavioral Response: CasualDrowsyEuthymic  Type of Therapy: Individual Therapy  Treatment Goals addressed: Improve Psychiatric Symptoms, Emotional Regulation Skills, Calming Skills, Healthy Coping Skills, discuss and process traumatic event, reduced irrational worries and fears  Interventions: Motivational Interviewing, Cognitive Behavior Therapy and Grounding/Mindfulness Skills, psychoeducation  Summary: Danielle [name] is a 47 y.o. female who presents with Schizophrenia and PTSD.   Suicidal/Homicidal: No - without intent/plan  Therapist Response:  Marykatherine met with clinician for an individual session.  [name] discussed her psychiatric symptoms, her current life events and her homework. [name] shared that she was very tired today and nodded off several times in session. She reports she has not been sleeping well and she has also been taking a blood thinner to help with a blood clot found in her lung. Clinician explored mood and anxiety levels. Clinician also provided information about [facility] in [place] for psychosocial rehab program, which would be an option for her to attend rather than sitting in the house by herself all day while husband is at work.   Plan: Return again in 2 weeks.  Diagnosis:     Axis I: Schizophrenia, paranoid type and PTSD  [name], LCSW 07/30/2017

## 2017-08-13 ENCOUNTER — Encounter (HOSPITAL_COMMUNITY): Payer: Self-pay | Admitting: Licensed Clinical Social Worker

## 2017-08-13 ENCOUNTER — Ambulatory Visit (HOSPITAL_COMMUNITY): Payer: BLUE CROSS/BLUE SHIELD | Admitting: Licensed Clinical Social Worker

## 2017-08-13 DIAGNOSIS — F4310 Post-traumatic stress disorder, unspecified: Secondary | ICD-10-CM

## 2017-08-13 DIAGNOSIS — F200 Paranoid schizophrenia: Secondary | ICD-10-CM
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
## 2017-08-26 ENCOUNTER — Ambulatory Visit (HOSPITAL_COMMUNITY): Payer: BLUE CROSS/BLUE SHIELD | Admitting: Psychiatry

## 2017-08-27 ENCOUNTER — Encounter (HOSPITAL_COMMUNITY): Payer: Self-pay | Admitting: Licensed Clinical Social Worker

## 2017-08-27 ENCOUNTER — Ambulatory Visit (HOSPITAL_COMMUNITY): Payer: BLUE CROSS/BLUE SHIELD | Admitting: Licensed Clinical Social Worker

## 2017-08-27 DIAGNOSIS — F4310 Post-traumatic stress disorder, unspecified: Secondary | ICD-10-CM

## 2017-08-27 DIAGNOSIS — F200 Paranoid schizophrenia: Secondary | ICD-10-CM

## 2017-08-27 NOTE — Progress Notes (Signed)
   THERAPIST PROGRESS NOTE  Session Time: 8:00am-8:55am  Participation Level: Active  Behavioral Response: CasualAlertDepressed  Type of Therapy: Individual Therapy  Treatment Goals addressed: Improve Psychiatric Symptoms, Emotional Regulation Skills, Calming Skills, Healthy Coping Skills, discuss and process traumatic event, reduced irrational worries and fears  Interventions: Motivational Interviewing, Cognitive Behavior Therapy and Grounding/Mindfulness Skills, psychoeducation  Summary: Danielle [name] is a 46 y.o. female who presents with Schizophrenia and PTSD.   Suicidal/Homicidal: No - without intent/plan  Therapist Response:  Danielle [name] met with clinician for an individual session. Danielle [name]'s husband joined the session to support her. Kashira discussed her psychiatric symptoms, her current life events. Danielle [name] shared that she and her husband had a big argument the other day because she has been using marijuana. Danielle [name] reports that due to her husband's strong feelings against marijuana, she agreed to quit. However, Danielle [name] was upset that she would not be able to use this as treatment for anxiety and paranoia. Clinician utilized MI OARS to reflect and summarize thoughts and feelings. Clinician offered alternative ways to cope with anxiety, including going outside for fresh air, listening to music, using CPAP machine to increase oxygen flow.  Clinician processed additional support and options for day programs.  Clinician completed case management activities including contacting [facility] and [facility] in [place] for possible referral to PSR and Day programs.   Plan: Return again in 2 weeks.  Diagnosis:     Axis I: Schizophrenia, paranoid type and PTSD  [name] R Schlosberg, LCSW 08/27/2017  

## 2017-08-31 ENCOUNTER — Other Ambulatory Visit (HOSPITAL_COMMUNITY): Payer: Self-pay | Admitting: Psychiatry

## 2017-08-31 DIAGNOSIS — F4310 Post-traumatic stress disorder, unspecified: Secondary | ICD-10-CM

## 2017-09-03 ENCOUNTER — Ambulatory Visit (HOSPITAL_COMMUNITY): Payer: BLUE CROSS/BLUE SHIELD | Admitting: Licensed Clinical Social Worker

## 2017-09-10 ENCOUNTER — Encounter (HOSPITAL_COMMUNITY): Payer: Self-pay | Admitting: Psychiatry

## 2017-09-10 ENCOUNTER — Ambulatory Visit (HOSPITAL_COMMUNITY): Payer: BLUE CROSS/BLUE SHIELD | Admitting: Psychiatry

## 2017-09-10 VITALS — BP 130/80 | HR 82 | Ht 70.0 in | Wt 247.0 lb

## 2017-09-10 DIAGNOSIS — F1290 Cannabis use, unspecified, uncomplicated: Secondary | ICD-10-CM

## 2017-09-10 DIAGNOSIS — F4310 Post-traumatic stress disorder, unspecified: Secondary | ICD-10-CM

## 2017-09-10 DIAGNOSIS — R251 Tremor, unspecified: Secondary | ICD-10-CM | POA: Diagnosis not present

## 2017-09-10 DIAGNOSIS — Z560 Unemployment, unspecified: Secondary | ICD-10-CM | POA: Diagnosis not present

## 2017-09-10 DIAGNOSIS — F419 Anxiety disorder, unspecified: Secondary | ICD-10-CM

## 2017-09-10 DIAGNOSIS — F250 Schizoaffective disorder, bipolar type: Secondary | ICD-10-CM

## 2017-09-10 DIAGNOSIS — Z87891 Personal history of nicotine dependence: Secondary | ICD-10-CM | POA: Diagnosis not present

## 2017-09-10 MED ORDER — PRAZOSIN HCL 2 MG PO CAPS: 2.0000 mg | ORAL_CAPSULE | Freq: Every day | ORAL | 0 refills | Status: DC

## 2017-09-10 MED ORDER — LITHIUM CARBONATE ER 450 MG PO TBCR: 900.0000 mg | EXTENDED_RELEASE_TABLET | Freq: Every day | ORAL | 0 refills | Status: DC

## 2017-09-10 MED ORDER — LOXAPINE SUCCINATE 50 MG PO CAPS: 50.0000 mg | ORAL_CAPSULE | Freq: Every day | ORAL | 0 refills | Status: DC

## 2017-09-10 MED ORDER — CLONAZEPAM 0.5 MG PO TABS: 0.5000 mg | ORAL_TABLET | Freq: Three times a day (TID) | ORAL | 2 refills | Status: DC | PRN

## 2017-09-10 MED ORDER — PAROXETINE HCL ER 37.5 MG PO TB24: 37.5000 mg | ORAL_TABLET | Freq: Every morning | ORAL | 0 refills | Status: DC

## 2017-09-10 NOTE — Telephone Encounter (Signed)
Medicine discontinued.

## 2017-09-10 NOTE — Progress Notes (Signed)
BH MD/PA/NP OP Progress Note  09/10/2017 8:11 AM Danielle [name]  MRN:  [id number]  Chief Complaint: I am smoking marijuana.  It is helping my sleep and paranoia.  HPI: Patient came for her follow-up appointment with her husband.  She admitted smoking marijuana for past few months which is helping her paranoia and sleep.  Her husband is not happy and he told her to stop the marijuana use.  This is a first-time patient told that she is smoking marijuana.  Patient appears somewhat labile.  She is concerned that if she does not smoke marijuana her paranoia and hallucination may come back.  She had tried trazodone which caused dizziness.  She is seeing [name] once a week.  Otherwise her condition remains the same.  She continues to have flashbacks and nightmares sometimes but she feels Minipress working.  Patient denies any suicidal thoughts or homicidal thought.  She lost another 10 pounds since the last visit.  Patient has mild tremors in her hand.  We have cut down her lithium because her last therapeutic level was one 1.35.  Patient denies any recent agitation, anger, aggressive behavior or any suicidal thoughts.  Patient agreed to stop smoking marijuana.   Visit Diagnosis:    ICD-10-CM   1. Schizoaffective disorder, bipolar type (HCC) F25.0 loxapine (LOXITANE) 50 MG capsule  2. PTSD (post-traumatic stress disorder) F43.10 prazosin (MINIPRESS) 2 MG capsule    lithium carbonate (ESKALITH) 450 MG CR tablet    PARoxetine (PAXIL-CR) 37.5 MG 24 hr tablet    clonazePAM (KLONOPIN) 0.5 MG tablet    Past Psychiatric History: Reviewed Patient has multiple hospitalization do to psychosis, paranoia, hallucination, suicidal attempt, extreme panic attack and severe depression. Her last admission was in April 2018 at old [facility]. She has at least 4 suicidal attempt. She has history of cutting herself and drowning herself. In the past she has tried multiple psychiatric medication with limited  response. She has taken Zoloft, amitriptyline, Wellbutrin, Latuda, Xanax, Valium, Risperdal, lithium, Haldol, Depakote, Zyprexa, Geodon and recently Lamictal. At her recent hospitalization at old [facility], she was started on Paxil, temazepam, imipramine and Invega.   Past Medical History:  Past Medical History:  Diagnosis Date  . Anemia   . Anxiety   . Bipolar 1 disorder (HCC)   . Depressed   . Headache(784.0)   . Heart murmur   . Hypothyroidism   . PTSD (post-traumatic stress disorder)   . Schizophrenia (HCC)   . Seizures (HCC)   . Shortness of breath     Past Surgical History:  Procedure Laterality Date  . FOOT SURGERY    . LEG SURGERY    . NASAL FRACTURE SURGERY    . TUBAL LIGATION  1996    Family Psychiatric History: Reviewed.  Family History:  Family History  Adopted: Yes    Social History:  Social History   Socioeconomic History  . Marital status: Married    Spouse name: Not on file  . Number of children: 2  . Years of education: Not on file  . Highest education level: Not on file  Occupational History  . Occupation: unemployed    Associate Professor: NOT EMPLOYED  Social Needs  . Financial resource strain: Not on file  . Food insecurity:    Worry: Not on file    Inability: Not on file  . Transportation needs:    Medical: Not on file    Non-medical: Not on file  Tobacco Use  . Smoking status: Former  Smoker    Packs/day: 1.50    Years: 30.00    Pack years: 45.00    Types: Cigarettes    Last attempt to quit: 09/20/2011    Years since quitting: 5.9  . Smokeless tobacco: Current User  Substance and Sexual Activity  . Alcohol use: No    Alcohol/week: 0.0 oz  . Drug use: No  . Sexual activity: Yes    Partners: Male    Birth control/protection: Surgical  Lifestyle  . Physical activity:    Days per week: Not on file    Minutes per session: Not on file  . Stress: Not on file  Relationships  . Social connections:    Talks on phone: Not on file    Gets  together: Not on file    Attends religious service: Not on file    Active member of club or organization: Not on file    Attends meetings of clubs or organizations: Not on file    Relationship status: Not on file  Other Topics Concern  . Not on file  Social History Narrative   Pt is adopted. Unsure of family history.    Allergies:  Allergies  Allergen Reactions  . Tomato Rash    Pt reports that she breaks out in a rash if she eats tomato based foods but can eat a whole tomato  . Imitrex [Sumatriptan Base] Hives  . Mustard Seed Rash    Also allergic to [facility]    Metabolic Disorder Labs: Lab Results  Component Value Date   HGBA1C 5.9 (H) 06/26/2017   MPG 123 (H) 02/05/2012   MPG 114 09/22/2011   Lab Results  Component Value Date   PROLACTIN 39.1 (H) 06/08/2015   Lab Results  Component Value Date   CHOL 218 (H) 06/08/2015   TRIG 215 (H) 06/08/2015   HDL 36 (L) 06/08/2015   CHOLHDL 6.1 06/08/2015   VLDL 43 (H) 06/08/2015   LDLCALC 139 (H) 06/08/2015   LDLCALC UNABLE TO CALCULATE IF TRIGLYCERIDE OVER 400 mg/dL 62/95/2841   Lab Results  Component Value Date   TSH 1.782 06/08/2015   TSH 2.083 04/07/2013    Therapeutic Level Labs: Lab Results  Component Value Date   LITHIUM 1.3 (H) 06/26/2017   LITHIUM 0.70 06/12/2015   Lab Results  Component Value Date   VALPROATE 12.7 06/10/2015   VALPROATE 62 06/08/2015   No components found for:  CBMZ  Current Medications: Current Outpatient Medications  Medication Sig Dispense Refill  . acetaminophen (TYLENOL 8 HOUR) 650 MG CR tablet Take 1 tablet (650 mg total) by mouth every 8 (eight) hours as needed for pain. 30 tablet 0  . aspirin EC 81 MG tablet Take 1 tablet (81 mg total) by mouth daily with breakfast. 30 tablet 0  . clonazePAM (KLONOPIN) 0.5 MG tablet Take 1 tablet (0.5 mg total) by mouth 3 (three) times daily as needed for anxiety. 90 tablet 1  . diltiazem (DILACOR XR) 120 MG 24 hr capsule Take 120 mg by mouth  daily.    Marland Kitchen eletriptan (RELPAX) 40 MG tablet TAKE 1 TAB AT ONSET OF HEADACHES. MAY REPEAT IN 2 HOURS.MAX 2/24 HRS. FOR 30 DAYS  6  . folic acid (FOLVITE) 1 MG tablet Take 1 tablet (1 mg total) by mouth daily. 30 tablet 0  . ibuprofen (ADVIL,MOTRIN) 800 MG tablet Take 800 mg by mouth every 8 (eight) hours as needed (pain).    Marland Kitchen levothyroxine (SYNTHROID, LEVOTHROID) 75 MCG tablet Take 1  tablet (75 mcg total) by mouth daily before breakfast. 30 tablet 0  . lithium carbonate (ESKALITH) 450 MG CR tablet Take 2 tablets (900 mg total) by mouth daily. 180 tablet 0  . loxapine (LOXITANE) 25 MG capsule Take 1 capsule (25 mg total) by mouth at bedtime. 90 capsule 0  . PARoxetine (PAXIL-CR) 37.5 MG 24 hr tablet Take 1 tablet (37.5 mg total) by mouth every morning. 90 tablet 0  . prazosin (MINIPRESS) 2 MG capsule Take 1 capsule (2 mg total) by mouth at bedtime. 90 capsule 0  . topiramate (TOPAMAX) 50 MG tablet Take 100 mg by mouth 2 (two) times daily.    . traZODone (DESYREL) 50 MG tablet Take 1 tablet (50 mg total) by mouth at bedtime. 30 tablet 1  . Vitamin D, Ergocalciferol, (DRISDOL) 50000 units CAPS capsule Take 50,000 Units by mouth every 7 (seven) days. saturday     No current facility-administered medications for this visit.      Musculoskeletal: Strength & Muscle Tone: within normal limits Gait & Station: normal Patient leans: N/A  Psychiatric Specialty Exam: Review of Systems  Constitutional: Positive for weight loss.  HENT: Negative.   Skin: Negative.   Neurological: Positive for tremors.    Blood pressure 130/80, pulse 82, height  (1.778 m), weight 247 lb (112 kg).There is no height or weight on file to calculate BMI.  General Appearance: Casual and Fairly Groomed  Eye Contact:  Fair  Speech:  Fast at rambling  Volume:  Normal  Mood:  Anxious  Affect:  Labile  Thought Process:  Descriptions of Associations: Circumstantial  Orientation:  Full (Time, Place, and Person)   Thought Content: Paranoid Ideation and Rumination   Suicidal Thoughts:  No  Homicidal Thoughts:  No  Memory:  Immediate;   Fair Recent;   Fair Remote;   Fair  Judgement:  Fair  Insight:  Present  Psychomotor Activity:  Increased  Concentration:  Concentration: Fair and Attention Span: Fair  Recall:  [employer] of Knowledge: Good  Language: Good  Akathisia:  No  Handed:  Right  AIMS (if indicated): not done  Assets:  Communication Skills Desire for Improvement Housing Social Support  ADL's:  Intact  Cognition: WNL  Sleep:  Fair   Screenings: AUDIT     Admission [facility]Discharged) from 06/07/2015 in [facility]  Alcohol Use Disorder Identification Test Final Score (AUDIT)  0  0       Assessment and Plan: Schizoaffective disorder, bipolar type.  Posttraumatic stress disorder.  Anxiety disorder NOS.  Cannabis abuse  I had a long discussion with the patient and her husband out cannabis use.  We discussed about interaction of a psychotropic medication and delayed recovery with THC use.  Patient promised and agreed to stop smoking.  However she needs something to help her paranoia and sleep.  I recommended to try Loxitane 50 mg at bedtime.  She is no longer taking trazodone because of dizziness.  Encouraged to keep appointment with [name] for coping and social skills.  Continue lithium 450 mg once a day.  We will do lithium level on her next appointment.  Continue Minipress 2 mg at bedtime, Paxil CR 37.5 mg daily and Klonopin 0.5 mg 3 times a day.  She also taking new medication for headaches from neurology.  Discussed medication side effects and benefits.  Recommended to call us back if she has  any question or any concern.  Follow-up in 3 months.  Encourage healthy lifestyle.  Patient lost more than 15 pounds in 3 months.     Danielle Nipper, MD 09/10/2017, 8:12 AM

## 2017-09-17 ENCOUNTER — Encounter (HOSPITAL_COMMUNITY): Payer: Self-pay | Admitting: Licensed Clinical Social Worker

## 2017-09-17 ENCOUNTER — Ambulatory Visit (HOSPITAL_COMMUNITY): Payer: BLUE CROSS/BLUE SHIELD | Admitting: Licensed Clinical Social Worker

## 2017-09-17 DIAGNOSIS — F200 Paranoid schizophrenia: Secondary | ICD-10-CM | POA: Diagnosis not present

## 2017-09-17 DIAGNOSIS — F4310 Post-traumatic stress disorder, unspecified: Secondary | ICD-10-CM | POA: Diagnosis not present

## 2017-09-17 NOTE — Progress Notes (Signed)
   THERAPIST PROGRESS NOTE  Session Time: 8:00am-8:45am  Participation Level: Active  Behavioral Response: CasualAlertEuthymic  Type of Therapy: Family Therapy  Treatment Goals addressed: Improve Psychiatric Symptoms, Emotional Regulation Skills, Calming Skills, Healthy Coping Skills, discuss and process traumatic event, reduced irrational worries and fears  Interventions: Motivational Interviewing, Cognitive Behavior Therapy and Grounding/Mindfulness Skills, psychoeducation  Summary: Danielle [name] is a 47 y.o. female who presents with Schizophrenia and PTSD.   Suicidal/Homicidal: No - without intent/plan  Therapist Response:  Ashni met with clinician for a family session. [name]'s husband joined the session to support her. [name] discussed her psychiatric symptoms, her current life events and her homework. [name] shared that she has started periodically losing her hearing. She was deaf in session and session had to be completed via text. Clinician explored thoughts and feelings, including panic sxs. Clinician discussed concerns about [name] being alone during husband's work hours. [name] identified options for going to her mother-in-law's house or her niece's home. Clinician encouraged [name] to go there during the day in order to have some company and to have someone look after her. Clinician explored relationship with husband, which was stated to be doing okay. Husband identified some extra spending, which caused an argument.   Clinician consulted with Dr. Adele Schilder re: deafness. He identified possibility of Conversion Disorder. This will continue to be explored. [name] was directed to communicate with primary doctor for referral to hearing specialist if needed.    Plan: Return again in 2-3 weeks.  Diagnosis:     Axis I: Schizophrenia, paranoid type and PTSD  [name], LCSW 09/17/2017

## 2017-09-19 ENCOUNTER — Telehealth (HOSPITAL_COMMUNITY): Payer: Self-pay

## 2017-09-19 NOTE — Telephone Encounter (Signed)
She is taking Klonopin 3 times a day.  If anxiety got worse she may take fourth 1 but make sure she does not have dizziness and fall as Klonopin does cause dizziness and fall.
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
## 2017-10-01 ENCOUNTER — Ambulatory Visit (HOSPITAL_COMMUNITY): Payer: Self-pay | Admitting: Licensed Clinical Social Worker

## 2017-10-09 ENCOUNTER — Other Ambulatory Visit (HOSPITAL_COMMUNITY): Payer: Self-pay

## 2017-10-09 ENCOUNTER — Encounter (HOSPITAL_COMMUNITY): Payer: Self-pay | Admitting: Licensed Clinical Social Worker

## 2017-10-09 ENCOUNTER — Ambulatory Visit (INDEPENDENT_AMBULATORY_CARE_PROVIDER_SITE_OTHER): Payer: BLUE CROSS/BLUE SHIELD | Admitting: Licensed Clinical Social Worker

## 2017-10-09 DIAGNOSIS — F4310 Post-traumatic stress disorder, unspecified: Secondary | ICD-10-CM

## 2017-10-09 DIAGNOSIS — F200 Paranoid schizophrenia: Secondary | ICD-10-CM

## 2017-10-09 MED ORDER — CLONAZEPAM 0.5 MG PO TABS: 0.5000 mg | ORAL_TABLET | Freq: Four times a day (QID) | ORAL | 1 refills | Status: DC | PRN
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
## 2017-10-23 ENCOUNTER — Encounter (HOSPITAL_COMMUNITY): Payer: Self-pay | Admitting: Licensed Clinical Social Worker

## 2017-10-23 ENCOUNTER — Ambulatory Visit (HOSPITAL_COMMUNITY): Payer: BLUE CROSS/BLUE SHIELD | Admitting: Licensed Clinical Social Worker

## 2017-10-23 DIAGNOSIS — F200 Paranoid schizophrenia: Secondary | ICD-10-CM | POA: Diagnosis not present

## 2017-10-23 DIAGNOSIS — F4310 Post-traumatic stress disorder, unspecified: Secondary | ICD-10-CM
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
## 2017-11-03 ENCOUNTER — Ambulatory Visit (HOSPITAL_COMMUNITY): Payer: BLUE CROSS/BLUE SHIELD | Admitting: Licensed Clinical Social Worker

## 2017-11-13 ENCOUNTER — Ambulatory Visit (HOSPITAL_COMMUNITY): Payer: Self-pay | Admitting: Psychiatry

## 2017-11-17 ENCOUNTER — Ambulatory Visit (HOSPITAL_COMMUNITY): Payer: Self-pay | Admitting: Licensed Clinical Social Worker

## 2017-11-20 ENCOUNTER — Other Ambulatory Visit (HOSPITAL_COMMUNITY): Payer: Self-pay | Admitting: Psychiatry

## 2017-11-20 DIAGNOSIS — F250 Schizoaffective disorder, bipolar type: Secondary | ICD-10-CM

## 2017-12-01 ENCOUNTER — Encounter (HOSPITAL_COMMUNITY): Payer: Self-pay | Admitting: Licensed Clinical Social Worker

## 2017-12-01 ENCOUNTER — Ambulatory Visit (INDEPENDENT_AMBULATORY_CARE_PROVIDER_SITE_OTHER): Payer: BLUE CROSS/BLUE SHIELD | Admitting: Licensed Clinical Social Worker

## 2017-12-01 DIAGNOSIS — F4310 Post-traumatic stress disorder, unspecified: Secondary | ICD-10-CM | POA: Diagnosis not present

## 2017-12-01 DIAGNOSIS — F200 Paranoid schizophrenia: Secondary | ICD-10-CM | POA: Diagnosis not present

## 2017-12-01 NOTE — Progress Notes (Signed)
   THERAPIST PROGRESS NOTE  Session Time: 8:00am-8:55am  Participation Level: Active  Behavioral Response: CasualAlertEuthymic  Type of Therapy: Individual Therapy  Treatment Goals addressed: Improve Psychiatric Symptoms, Emotional Regulation Skills, Calming Skills, Healthy Coping Skills, discuss and process traumatic event, reduced irrational worries and fears  Interventions: Motivational Interviewing, Cognitive Behavior Therapy and Grounding/Mindfulness Skills, psychoeducation  Summary: Danielle [name] is a 47 y.o. female who presents with Schizophrenia and PTSD.   Suicidal/Homicidal: No - without intent/plan  Therapist Response:  Danielle [name] met with clinician for an individual session. Danielle [name] discussed her psychiatric symptoms, her current life events. Danielle [name] shared that she has been doing pretty well over the past 2 weeks. She identified no seizures or significant episodes of hearing loss since 11/14/17 and she reports feeling good. Clinician explored changes made and noted that nothing could really be identified, but she is doing better. Danielle [name] processed her recent mini-vacation with her husband for their 12th wedding anniversary. She also identified that her none of her children called her on her birthday. Clinician utilized MI OARS to reflect thoughts and feelings of disappointment and sadness. Danielle [name] reported that she expected a phone call from her youngest and the very least and that hurt the most. However, she reports she continues to take one day at a time.  Clinician explored options for applying for SSDI and will research if there is an income cap.   Plan: Return again in 2 weeks.  Diagnosis:     Axis I: Schizophrenia, paranoid type and PTSD  [name], LCSW 12/01/2017

## 2017-12-03 ENCOUNTER — Other Ambulatory Visit (HOSPITAL_COMMUNITY): Payer: Self-pay | Admitting: Psychiatry

## 2017-12-03 ENCOUNTER — Other Ambulatory Visit (HOSPITAL_COMMUNITY): Payer: Self-pay

## 2017-12-03 DIAGNOSIS — F4310 Post-traumatic stress disorder, unspecified: Secondary | ICD-10-CM

## 2017-12-03 MED ORDER — CLONAZEPAM 0.5 MG PO TABS
0.5000 mg | ORAL_TABLET | Freq: Four times a day (QID) | ORAL | 0 refills | Status: DC | PRN
Start: 2017-12-03 — End: 2017-12-30

## 2017-12-15 ENCOUNTER — Encounter (HOSPITAL_COMMUNITY): Payer: Self-pay | Admitting: Licensed Clinical Social Worker

## 2017-12-15 ENCOUNTER — Ambulatory Visit (HOSPITAL_COMMUNITY): Payer: BLUE CROSS/BLUE SHIELD | Admitting: Licensed Clinical Social Worker

## 2017-12-15 DIAGNOSIS — F4310 Post-traumatic stress disorder, unspecified: Secondary | ICD-10-CM

## 2017-12-15 DIAGNOSIS — F200 Paranoid schizophrenia: Secondary | ICD-10-CM | POA: Diagnosis not present
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
## 2017-12-29 ENCOUNTER — Ambulatory Visit (INDEPENDENT_AMBULATORY_CARE_PROVIDER_SITE_OTHER): Payer: BLUE CROSS/BLUE SHIELD | Admitting: Licensed Clinical Social Worker

## 2017-12-29 ENCOUNTER — Encounter (HOSPITAL_COMMUNITY): Payer: Self-pay | Admitting: Licensed Clinical Social Worker

## 2017-12-29 DIAGNOSIS — F200 Paranoid schizophrenia: Secondary | ICD-10-CM | POA: Diagnosis not present

## 2017-12-29 DIAGNOSIS — F4310 Post-traumatic stress disorder, unspecified: Secondary | ICD-10-CM | POA: Diagnosis not present

## 2017-12-29 NOTE — Progress Notes (Signed)
   THERAPIST PROGRESS NOTE  Session Time: 8:15am-9:00am  Participation Level: Active  Behavioral Response: CasualAlertEuthymic  Type of Therapy: Individual Therapy  Treatment Goals addressed: Improve Psychiatric Symptoms, Emotional Regulation Skills, Calming Skills, Healthy Coping Skills, discuss and process traumatic event, reduced irrational worries and fears  Interventions: Motivational Interviewing, Cognitive Behavior Therapy and Grounding/Mindfulness Skills, psychoeducation  Summary: [name] is a 47 y.o. female who presents with Schizophrenia and PTSD.   Suicidal/Homicidal: No - without intent/plan  Therapist Response:  Satori met with clinician for an individual session. [name]'s husband joined the session to support her. Kjersten discussed her psychiatric symptoms, her current life events. [name] shared that overall she has been feeling pretty happy. She was excited to report on a camper they had just purchased, as well as a recent visit with her son Remo Lipps. Clinician reflected thoughts and feelings using MI. Clinician explored sleep and appetite, noting increase in PTSD type nightmares, but maintaining a good mood. [name] reported having some panic attacks when stress hits, including discussion of bills and finances with husband. [name] reports increased socialization and visiting with her mother-in-law when husband is at work or if he is sleeping during the day. Clinician identified the importance of getting out of the house and not being alone.   Plan: Return again in 2 weeks. [name] will see Dr. Parke Poisson tomorrow at 8:30am for medication management. Please note concerns re: nightmares. Also, concerns re: leg swelling. [name] was encouraged to see primary doctor to address this concern.   Diagnosis:     Axis I: Schizophrenia, paranoid type and PTSD   [name], LCSW 12/29/2017

## 2017-12-30 ENCOUNTER — Encounter (HOSPITAL_COMMUNITY): Payer: Self-pay | Admitting: Psychiatry

## 2017-12-30 ENCOUNTER — Ambulatory Visit (INDEPENDENT_AMBULATORY_CARE_PROVIDER_SITE_OTHER): Payer: BLUE CROSS/BLUE SHIELD | Admitting: Psychiatry

## 2017-12-30 VITALS — BP 133/90 | HR 80 | Ht 71.0 in | Wt 246.0 lb

## 2017-12-30 DIAGNOSIS — F250 Schizoaffective disorder, bipolar type: Secondary | ICD-10-CM | POA: Diagnosis not present

## 2017-12-30 DIAGNOSIS — F4310 Post-traumatic stress disorder, unspecified: Secondary | ICD-10-CM | POA: Diagnosis not present

## 2017-12-30 DIAGNOSIS — Z79899 Other long term (current) drug therapy: Secondary | ICD-10-CM | POA: Diagnosis not present

## 2017-12-30 DIAGNOSIS — Z560 Unemployment, unspecified: Secondary | ICD-10-CM | POA: Diagnosis not present

## 2017-12-30 MED ORDER — PRAZOSIN HCL 2 MG PO CAPS: 2.0000 mg | ORAL_CAPSULE | Freq: Every day | ORAL | 0 refills | Status: DC

## 2017-12-30 MED ORDER — LOXAPINE SUCCINATE 50 MG PO CAPS: 50.0000 mg | ORAL_CAPSULE | Freq: Every day | ORAL | 0 refills | Status: DC

## 2017-12-30 MED ORDER — CLONAZEPAM 0.5 MG PO TABS: 0.5000 mg | ORAL_TABLET | Freq: Four times a day (QID) | ORAL | 1 refills | Status: DC | PRN

## 2017-12-30 MED ORDER — PAROXETINE HCL ER 37.5 MG PO TB24: 37.5000 mg | ORAL_TABLET | Freq: Every morning | ORAL | 0 refills | Status: DC

## 2017-12-30 MED ORDER — LITHIUM CARBONATE ER 450 MG PO TBCR: 450.0000 mg | EXTENDED_RELEASE_TABLET | Freq: Every day | ORAL | 0 refills | Status: DC

## 2017-12-30 NOTE — Progress Notes (Signed)
BH MD/PA/NP OP Progress Note  12/30/2017 8:44 AM Danielle [name]  MRN:  [id number]  Chief Complaint: Patient returns for medication management HPI: 47 year old married female, presents with her husband for medication management appointment. Patient is an established patient of Dr. Lolly MustacheArfeen, who is currently out of office and who I am covering for.  She understands that she will continue to follow with Dr. Lolly MustacheArfeen. She has a history of schizoaffective disorder and of PTSD. She states that she has been doing well.  Describes she has been stable.  Informs me that her therapist told her recently that "this is the best she has seen me".  Husband corroborates that patient has been doing well. She does report frequent nightmares and "night terrors" which have improved but not completely resolved with Minipress. She denies depression and presents euthymic with a full range of affect.  She denies suicidal ideations.  She reports chronic auditory hallucinations which she describes as critical/insulting voices but which she states have improved "a lot" and are now "manageable".  At this time she does not appear internally preoccupied and no delusions are expressed . She is tolerating medications well and denies side effects . Of note, states that she is now taking lithium at 450 mg daily rather than 900 mg daily.  States that this was recommended by Dr. Lolly MustacheArfeen at last visit . Visit Diagnosis:    ICD-10-CM   1. PTSD (post-traumatic stress disorder) F43.10 clonazePAM (KLONOPIN) 0.5 MG tablet    lithium carbonate (ESKALITH) 450 MG CR tablet    PARoxetine (PAXIL-CR) 37.5 MG 24 hr tablet    prazosin (MINIPRESS) 2 MG capsule  2. Schizoaffective disorder, bipolar type (HCC) F25.0 loxapine (LOXITANE) 50 MG capsule    Past Psychiatric History:   Past Medical History:  Past Medical History:  Diagnosis Date  . Anemia   . Anxiety   . Bipolar 1 disorder (HCC)   . Depressed   . Headache(784.0)   . Heart murmur    . Hypothyroidism   . PTSD (post-traumatic stress disorder)   . Schizophrenia (HCC)   . Seizures (HCC)   . Shortness of breath     Past Surgical History:  Procedure Laterality Date  . FOOT SURGERY    . LEG SURGERY    . NASAL FRACTURE SURGERY    . TUBAL LIGATION  1996    Family Psychiatric History:   Family History:  Family History  Adopted: Yes    Social History:  Social History   Socioeconomic History  . Marital status: Married    Spouse name: Not on file  . Number of children: 2  . Years of education: Not on file  . Highest education level: Not on file  Occupational History  . Occupation: unemployed    Associate Professormployer: NOT EMPLOYED  Social Needs  . Financial resource strain: Not on file  . Food insecurity:    Worry: Not on file    Inability: Not on file  . Transportation needs:    Medical: Not on file    Non-medical: Not on file  Tobacco Use  . Smoking status: Former Smoker    Packs/day: 1.50    Years: 30.00    Pack years: 45.00    Types: Cigarettes    Last attempt to quit: 09/20/2011    Years since quitting: 6.2  . Smokeless tobacco: Current User  Substance and Sexual Activity  . Alcohol use: No    Alcohol/week: 0.0 standard drinks  . Drug use: No  .  Sexual activity: Yes    Partners: Male    Birth control/protection: Surgical  Lifestyle  . Physical activity:    Days per week: Not on file    Minutes per session: Not on file  . Stress: Not on file  Relationships  . Social connections:    Talks on phone: Not on file    Gets together: Not on file    Attends religious service: Not on file    Active member of club or organization: Not on file    Attends meetings of clubs or organizations: Not on file    Relationship status: Not on file  Other Topics Concern  . Not on file  Social History Narrative   Pt is adopted. Unsure of family history.    Allergies:  Allergies  Allergen Reactions  . Tomato Rash    Pt reports that she breaks out in a rash if  she eats tomato based foods but can eat a whole tomato  . Imitrex [Sumatriptan Base] Hives  . Mustard Seed Rash    Also allergic to [facility]    Metabolic Disorder Labs: Lab Results  Component Value Date   HGBA1C 5.9 (H) 06/26/2017   MPG 123 (H) 02/05/2012   MPG 114 09/22/2011   Lab Results  Component Value Date   PROLACTIN 39.1 (H) 06/08/2015   Lab Results  Component Value Date   CHOL 218 (H) 06/08/2015   TRIG 215 (H) 06/08/2015   HDL 36 (L) 06/08/2015   CHOLHDL 6.1 06/08/2015   VLDL 43 (H) 06/08/2015   LDLCALC 139 (H) 06/08/2015   LDLCALC UNABLE TO CALCULATE IF TRIGLYCERIDE OVER 400 mg/dL 16/02/9603   Lab Results  Component Value Date   TSH 1.782 06/08/2015   TSH 2.083 04/07/2013    Therapeutic Level Labs: Lab Results  Component Value Date   LITHIUM 1.3 (H) 06/26/2017   LITHIUM 0.70 06/12/2015   Lab Results  Component Value Date   VALPROATE 12.7 06/10/2015   VALPROATE 62 06/08/2015   No components found for:  CBMZ  Current Medications: Current Outpatient Medications  Medication Sig Dispense Refill  . acetaminophen (TYLENOL 8 HOUR) 650 MG CR tablet Take 1 tablet (650 mg total) by mouth every 8 (eight) hours as needed for pain. 30 tablet 0  . aspirin EC 81 MG tablet Take 1 tablet (81 mg total) by mouth daily with breakfast. 30 tablet 0  . clonazePAM (KLONOPIN) 0.5 MG tablet Take 1 tablet (0.5 mg total) by mouth 4 (four) times daily as needed for anxiety. 120 tablet 1  . diltiazem (DILACOR XR) 120 MG 24 hr capsule Take 120 mg by mouth daily.    Marland Kitchen eletriptan (RELPAX) 40 MG tablet TAKE 1 TAB AT ONSET OF HEADACHES. MAY REPEAT IN 2 HOURS.MAX 2/24 HRS. FOR 30 DAYS  6  . folic acid (FOLVITE) 1 MG tablet Take 1 tablet (1 mg total) by mouth daily. 30 tablet 0  . ibuprofen (ADVIL,MOTRIN) 800 MG tablet Take 800 mg by mouth every 8 (eight) hours as needed (pain).    Marland Kitchen levothyroxine (SYNTHROID, LEVOTHROID) 100 MCG tablet TAKE ONE TABLET (100 MCG DOSE) BY MOUTH DAILY.  5  .  lithium carbonate (ESKALITH) 450 MG CR tablet Take 1 tablet (450 mg total) by mouth daily. 90 tablet 0  . loxapine (LOXITANE) 50 MG capsule Take 1 capsule (50 mg total) by mouth at bedtime. 90 capsule 0  . PARoxetine (PAXIL-CR) 37.5 MG 24 hr tablet Take 1 tablet (37.5 mg total) by  mouth every morning. 90 tablet 0  . prazosin (MINIPRESS) 2 MG capsule Take 1 capsule (2 mg total) by mouth at bedtime. 90 capsule 0  . TROKENDI XR 100 MG CP24 Take 1 tablet by mouth daily.  3  . TROKENDI XR 200 MG CP24 Take 1 tablet by mouth daily.  3  . Vitamin D, Ergocalciferol, (DRISDOL) 50000 units CAPS capsule Take 50,000 Units by mouth every 7 (seven) days. saturday     No current facility-administered medications for this visit.      Musculoskeletal: Strength & Muscle Tone: within normal limits Gait & Station: normal Patient leans: N/A  Psychiatric Specialty Exam: ROS denies chest pain, denies shortness of breath, no vomiting  There were no vitals taken for this visit.There is no height or weight on file to calculate BMI.  General Appearance: Well Groomed  Eye Contact:  Good  Speech:  Normal Rate  Volume:  Normal  Mood:  Reports improved mood and presents euthymic  Affect:  Appropriate and Reactive  Thought Process:  Linear and Descriptions of Associations: Intact  Orientation:  Full (Time, Place, and Person)  Thought Content: Reports chronic hallucinations, states that they have decreased significantly and are currently "manageable", no delusions, not internally preoccupied   Suicidal Thoughts:  No denies suicidal or self-injurious ideations  Homicidal Thoughts:  No denies violent or homicidal ideations  Memory:  Recent and remote grossly intact  Judgement:  Other:  Improved  Insight:  Present  Psychomotor Activity:  Normal  Concentration:  Concentration: Good and Attention Span: Good  Recall:  Good  Fund of Knowledge: Good  Language: Good  Akathisia:  Negative  Handed:  Right  AIMS (if  indicated): No abnormal movements reported or noted at this time  Assets:  Communication Skills Desire for Improvement Resilience  ADL's:  Intact  Cognition: WNL  Sleep:  Good   Screenings: AUDIT     Admission (Discharged) from 06/07/2015 in [facility] to [facility]  Alcohol Use Disorder Identification Test Final Score (AUDIT)  0  0       Assessment and Plan: 46 year old married female, history of chronic mental illness (schizoaffective disorder, PTSD).  Currently reports improvement, states she is doing better than before and presents euthymic.  Husband corroborates improvement, and patient states her therapist had also commented on how much that she is doing.  She does report chronic/persistent but much improved auditory hallucinations and some persistent nightmares/night terrors. We discussed, she feels current medication regimen is effective and well-tolerated.  We will continue current medication regimen.  No medication changes at this time. Renewed medications.  Have also requested lithium serum level and TSH serum level. Patient to make appointment in 2 months with Dr. Lolly Mustache but agrees to contact clinic sooner should to be any worsening prior   Craige Cotta, MD 12/30/2017, 8:44 AM

## 2017-12-31 LAB — LITHIUM LEVEL: Lithium Lvl: 0.7 mmol/L (ref 0.6–1.2)

## 2017-12-31 LAB — TSH+FREE T4
FREE T4: 1.03 ng/dL (ref 0.82–1.77)
TSH: 2.25 u[IU]/mL (ref 0.450–4.500)

## 2018-01-12 ENCOUNTER — Ambulatory Visit (INDEPENDENT_AMBULATORY_CARE_PROVIDER_SITE_OTHER): Payer: BLUE CROSS/BLUE SHIELD | Admitting: Licensed Clinical Social Worker

## 2018-01-12 ENCOUNTER — Encounter (HOSPITAL_COMMUNITY): Payer: Self-pay | Admitting: Licensed Clinical Social Worker

## 2018-01-12 DIAGNOSIS — F200 Paranoid schizophrenia: Secondary | ICD-10-CM

## 2018-01-12 DIAGNOSIS — F4310 Post-traumatic stress disorder, unspecified: Secondary | ICD-10-CM | POA: Diagnosis not present

## 2018-01-12 NOTE — Progress Notes (Signed)
   THERAPIST PROGRESS NOTE  Session Time: 8:00am-8:40am  Participation Level: Active  Behavioral Response: CasualAlertEuthymic  Type of Therapy: Individual Therapy  Treatment Goals addressed: Improve Psychiatric Symptoms, Emotional Regulation Skills, Calming Skills, Healthy Coping Skills, discuss and process traumatic event, reduced irrational worries and fears  Interventions: Motivational Interviewing, Cognitive Behavior Therapy and Grounding/Mindfulness Skills, psychoeducation  Summary: Danielle [name] is a 47 y.o. female who presents with Schizophrenia and PTSD.   Suicidal/Homicidal: No - without intent/plan  Therapist Response:  Danielle [name] met with clinician for an individual session. Danielle [name] discussed her psychiatric symptoms, her current life events. Danielle [name] shared that she has been experiencing more panic attacks lately, possibly due to new med (hydrochlorothiazide) being prescribed to help with swelling of legs. Clinician explored sxs and noted possibility of lowering Lithium levels at higher doses. Nurse discussed this medication with Danielle [name] after session and encouraged labs to be done in order to assess lithium levels. Clinician processed anxiety, also identifying other changes. Danielle [name] reported her husband's shift changed again, which means he is gone from about 5pm-7am. She reports this is worse for her because of more waking hours without him, as well as change in her routine of visiting mother-in-law throughout the day. Danielle [name] reports she did get some time with son over the weekend, which improves her mood.   Plan: Return again in 2 weeks.  Diagnosis:     Axis I: Schizophrenia, paranoid type and PTSD  [name], LCSW 01/12/2018

## 2018-01-21 ENCOUNTER — Ambulatory Visit (INDEPENDENT_AMBULATORY_CARE_PROVIDER_SITE_OTHER): Payer: BLUE CROSS/BLUE SHIELD | Admitting: Psychiatry

## 2018-01-21 ENCOUNTER — Encounter (HOSPITAL_COMMUNITY): Payer: Self-pay | Admitting: Psychiatry

## 2018-01-21 VITALS — BP 129/87 | HR 68 | Ht 71.0 in | Wt 243.0 lb

## 2018-01-21 DIAGNOSIS — F250 Schizoaffective disorder, bipolar type: Secondary | ICD-10-CM

## 2018-01-21 DIAGNOSIS — F4310 Post-traumatic stress disorder, unspecified: Secondary | ICD-10-CM | POA: Diagnosis not present

## 2018-01-21 DIAGNOSIS — F430 Acute stress reaction: Secondary | ICD-10-CM

## 2018-01-21 DIAGNOSIS — F515 Nightmare disorder: Secondary | ICD-10-CM

## 2018-01-21 DIAGNOSIS — Z79899 Other long term (current) drug therapy: Secondary | ICD-10-CM

## 2018-01-21 DIAGNOSIS — Z634 Disappearance and death of family member: Secondary | ICD-10-CM

## 2018-01-21 MED ORDER — LOXAPINE SUCCINATE 50 MG PO CAPS: 50.0000 mg | ORAL_CAPSULE | Freq: Two times a day (BID) | ORAL | 0 refills | Status: DC

## 2018-01-21 NOTE — Progress Notes (Signed)
BH MD/PA/NP OP Progress Note  01/21/2018 2:48 PM Danielle [name]  MRN:  [id number]  Chief Complaint: Worsening AH after finding out her daughter overdosed twice on heroin recently   HPI: Danielle [name] is a 47 y.o. female, married who presents with her husband for urgent appointment.   Patient is an established patient of Dr. Adele Schilder, who is currently out of office and who I am covering for.  She understands that she will continue to follow with Dr. Adele Schilder.  She has a history of schizoaffective disorder and of PTSD.  At her last visit, 12/30/3017 she was doing well, and no medication changes were made.  Of note, states that she is now taking lithium at 450 mg daily rather than 900 mg daily.  States that this was recommended by Dr. Adele Schilder at last visit .  Since her last visit, she was told by family friends on 01/15/2018 that her 35 year old daughter had 2 recent heroin overdoses and required narcan for resuscitation.  She has not seen her daughter or know where she is, and is exceedingly concerned.  Since hearing this news, Hiilei has been having nightmares about her daughter dying, and hearing voices telling her that she is stupid, dumb, worthless, and doesn't deserve to live.  Patient has significant guilt about her daughter's addiction, as she had been addicted to crack in her past, and she states that her daughter told her that "she never wanted to be like me."  Patient has been in regular therapy, but feels she does not have good coping mechanisms to deal with stressors.  She reports that she has a good relationship with her oldest son, but her younger son does not have contact with her.  She has been married to her husband [name] since 1996 after quitting crack in 1994.  He is very supportive, however he works night shifts.  She is able to call him when she is awakened with nightmares.   At this time she does appear internally preoccupied. Patient is able to distract herself from [facility] with positive  thoughts and memories, and telling the voices that she does not believe them while she is at her visit today.  She denies command auditory hallucinations, and denies visual hallucinations.  She denies any thoughts, plan or intent for suicide, and husband agrees that she can be safe at home.  She denies HI.  No delusions are expressed.  She has been tolerating medications well and denies side effects .  She is scheduled for weekly therapy with Janett Billow, and has an appointment next week. I have spoken with Janett Billow today regarding her new stressor regarding her daughter.  Coping strategies are reviewed during visit today.   Visit Diagnosis:    ICD-10-CM   1. Schizoaffective disorder, bipolar type ([place]) F25.0 loxapine (LOXITANE) 50 MG capsule    Lithium level    Comprehensive metabolic panel    Comprehensive metabolic panel    Lithium level  2. 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 Posttraumatic stress disorder F43.10   5. Encounter for long-term (current) use of medications Z79.899 Lithium level    Comprehensive metabolic panel    Comprehensive metabolic panel    Lithium level    Past Psychiatric History:   Reviewed  Past Medical History:  Past Medical History:  Diagnosis Date  . Anemia   . Anxiety   . Bipolar 1 disorder ([place])   . Depressed   . Headache(784.0)   .  Heart murmur   . Hypothyroidism   . PTSD (post-traumatic stress disorder)   . Schizophrenia ([name])   . Seizures ([place])   . Shortness of breath     Past Surgical History:  Procedure Laterality Date  . FOOT SURGERY    . LEG SURGERY    . NASAL FRACTURE SURGERY    . TUBAL LIGATION  1996    Family Psychiatric History:  Updated   Family History:  Family History  Adopted: Yes  Problem Relation Age of Onset  . Drug abuse Daughter        heroin addiction    Social History:  Off crack since 1994  Met [name] in 1996 Has 3 grown children: 2 sons and 1 daughter. Only in contact with oldest  son.  Social History   Socioeconomic History  . Marital status: Married    Spouse name: Not on file  . Number of children: 2  . Years of education: Not on file  . Highest education level: Not on file  Occupational History  . Occupation: unemployed    Fish farm manager: NOT EMPLOYED  Social Needs  . Financial resource strain: Not on file  . Food insecurity:    Worry: Not on file    Inability: Not on file  . Transportation needs:    Medical: Not on file    Non-medical: Not on file  Tobacco Use  . Smoking status: Former Smoker    Packs/day: 1.50    Years: 30.00    Pack years: 45.00    Types: Cigarettes    Last attempt to quit: 09/20/2011    Years since quitting: 6.3  . Smokeless tobacco: Current User  Substance and Sexual Activity  . Alcohol use: No    Alcohol/week: 0.0 standard drinks  . Drug use: No  . Sexual activity: Yes    Partners: Male    Birth control/protection: Surgical  Lifestyle  . Physical activity:    Days per week: Not on file    Minutes per session: Not on file  . Stress: Not on file  Relationships  . Social connections:    Talks on phone: Not on file    Gets together: Not on file    Attends religious service: Not on file    Active member of club or organization: Not on file    Attends meetings of clubs or organizations: Not on file    Relationship status: Not on file  Other Topics Concern  . Not on file  Social History Narrative   Pt is adopted. Unsure of family history.    Allergies:  Allergies  Allergen Reactions  . Tomato Rash    Pt reports that she breaks out in a rash if she eats tomato based foods but can eat a whole tomato  . Imitrex [Sumatriptan Base] Hives  . Mustard Seed Rash    Also allergic to [facility]    Metabolic Disorder Labs: Lab Results  Component Value Date   HGBA1C 5.9 (H) 06/26/2017   MPG 123 (H) 02/05/2012   MPG 114 09/22/2011   Lab Results  Component Value Date   PROLACTIN 39.1 (H) 06/08/2015   Lab Results  Component  Value Date   CHOL 218 (H) 06/08/2015   TRIG 215 (H) 06/08/2015   HDL 36 (L) 06/08/2015   CHOLHDL 6.1 06/08/2015   VLDL 43 (H) 06/08/2015   LDLCALC 139 (H) 06/08/2015   LDLCALC UNABLE TO CALCULATE IF TRIGLYCERIDE OVER 400 mg/dL 09/22/2011   Lab Results  Component Value Date   TSH 2.250 12/30/2017   TSH 1.782 06/08/2015    Therapeutic Level Labs: Lab Results  Component Value Date   LITHIUM 0.7 12/30/2017   LITHIUM 1.3 (H) 06/26/2017   Lab Results  Component Value Date   VALPROATE 12.7 06/10/2015   VALPROATE 62 06/08/2015   No components found for:  CBMZ  Current Medications: Current Outpatient Medications  Medication Sig Dispense Refill  . acetaminophen (TYLENOL 8 HOUR) 650 MG CR tablet Take 1 tablet (650 mg total) by mouth every 8 (eight) hours as needed for pain. 30 tablet 0  . ALBUTEROL IN Inhale into the lungs.    Marland Kitchen aspirin EC 81 MG tablet Take 1 tablet (81 mg total) by mouth daily with breakfast. 30 tablet 0  . clonazePAM (KLONOPIN) 0.5 MG tablet Take 1 tablet (0.5 mg total) by mouth 4 (four) times daily as needed for anxiety. 120 tablet 1  . diltiazem (DILACOR XR) 120 MG 24 hr capsule Take 120 mg by mouth daily.    Marland Kitchen eletriptan (RELPAX) 40 MG tablet TAKE 1 TAB AT ONSET OF HEADACHES. MAY REPEAT IN 2 HOURS.MAX 2/24 HRS. FOR 30 DAYS  6  . folic acid (FOLVITE) 1 MG tablet Take 1 tablet (1 mg total) by mouth daily. 30 tablet 0  . hydrochlorothiazide (HYDRODIURIL) 12.5 MG tablet TAKE ONE TABLET (12.5 MG DOSE) BY MOUTH DAILY. TAKE ON TABLET BY MOUTH ONCE A DAY IN THE MORNING.  0  . ibuprofen (ADVIL,MOTRIN) 800 MG tablet Take 800 mg by mouth every 8 (eight) hours as needed (pain).    Marland Kitchen levothyroxine (SYNTHROID, LEVOTHROID) 100 MCG tablet TAKE ONE TABLET (100 MCG DOSE) BY MOUTH DAILY.  5  . lithium carbonate (ESKALITH) 450 MG CR tablet Take 1 tablet (450 mg total) by mouth daily. 90 tablet 0  . loxapine (LOXITANE) 50 MG capsule Take 1 capsule (50 mg total) by mouth at bedtime. 90  capsule 0  . PARoxetine (PAXIL-CR) 37.5 MG 24 hr tablet Take 1 tablet (37.5 mg total) by mouth every morning. 90 tablet 0  . POTASSIUM PO Take by mouth.    . prazosin (MINIPRESS) 2 MG capsule Take 1 capsule (2 mg total) by mouth at bedtime. 90 capsule 0  . TROKENDI XR 100 MG CP24 Take 1 tablet by mouth daily.  3  . TROKENDI XR 200 MG CP24 Take 1 tablet by mouth daily.  3  . Vitamin D, Ergocalciferol, (DRISDOL) 50000 units CAPS capsule Take 50,000 Units by mouth every 7 (seven) days. saturday     No current facility-administered medications for this visit.      Musculoskeletal: Strength & Muscle Tone: within normal limits Gait & Station: normal Patient leans: N/A  Psychiatric Specialty Exam: ROS denies chest pain, denies shortness of breath, no vomiting. Concerned about Lithium levels as she has started diuretics for generalized edema.   Blood pressure 129/87, pulse 68, height 5' 11"  (1.803 m), weight 243 lb (110.2 kg), SpO2 93 %.Body mass index is 33.89 kg/m.  General Appearance: Disheveled  Eye Contact:  Fair  Speech:  Normal Rate  Volume:  Normal  Mood:  Negative, Anxious, Depressed and Worthless  Affect:  Congruent, Depressed and Tearful  Thought Process:  Descriptions of Associations: Tangential  Orientation:  Full (Time, Place, and Person)  Thought Content: Logical, Hallucinations: Auditory and Rumination of worry about her daughter   Suicidal Thoughts:  No denies suicidal or self-injurious ideations  Homicidal Thoughts:  No denies violent or homicidal ideations  Memory:  Recent and remote grossly intact  Judgement:  Other:  Improved  Insight:  Present  Psychomotor Activity:  Normal  Concentration:  Concentration: Good and Attention Span: Good  Recall:  Good  Fund of Knowledge: Good  Language: Good  Akathisia:  Negative  Handed:  Right  AIMS (if indicated): No abnormal movements reported or noted at this time  Assets:  Communication Skills Desire for  Improvement Resilience  ADL's:  Intact  Cognition: WNL  Sleep:  Good   Screenings: AUDIT     Admission (Discharged) from 06/07/2015 in [facility] to [facility]-Admission (Discharged) from 04/06/2013 in [facility]  Alcohol Use Disorder Identification Test Final Score (AUDIT)  0  0       Assessment and Plan: 47 y.o. old married female, history of chronic mental illness (schizoaffective disorder, PTSD).  Currently decompensated after learning of daughter's heroin overdoses requiring Narcan resuscitation.  She is accompanied by husband, and she and husband agree that Masyn is not in danger of self harm, but requesting increase in antipsychotic while she manages acute stress.  Patient is well established with therapist in this practice, who has been made aware of patient's status.  They have an appointment scheduled for next week.  At this time will increase Loxapine to 50 mg BID.  Continue other medications as prescribed. Will order Lithium level and CMP today to evaluate diuretic effect on Lithium.   Time spent 45 minutes.  More than 50% of the time spent in medication education, psychoeducation, counseling and coordination of care.  Discuss safety plan that anytime having active suicidal thoughts or homicidal thoughts then patient need to call 911 or go to the local emergency room. Patient and husband are agreeable to come to ED for worsening symptoms or suicidal thoughts/  Patient to make appointment in 2 week appointe with Dr. Adele Schilder but agrees to contact clinic sooner should to be any worsening prior.    Lavella Hammock, MD 01/21/2018, 2:48 PM

## 2018-01-22 LAB — COMPREHENSIVE METABOLIC PANEL
A/G RATIO: 1.8 (ref 1.2–2.2)
ALBUMIN: 4.2 g/dL (ref 3.5–5.5)
ALT: 11 IU/L (ref 0–32)
AST: 8 IU/L (ref 0–40)
Alkaline Phosphatase: 88 IU/L (ref 39–117)
BUN/Creatinine Ratio: 13 (ref 9–23)
BUN: 11 mg/dL (ref 6–24)
Bilirubin Total: <0.2 mg/dL (ref 0.0–1.2)
CALCIUM: 9.4 mg/dL (ref 8.7–10.2)
CHLORIDE: 101 mmol/L (ref 96–106)
CO2: 24 mmol/L (ref 20–29)
CREATININE: 0.82 mg/dL (ref 0.57–1.00)
GFR calc Af Amer: 97 mL/min/{1.73_m2} (ref >59)
GFR, EST NON AFRICAN AMERICAN: 84 mL/min/{1.73_m2} (ref >59)
GLOBULIN, TOTAL: 2.4 g/dL (ref 1.5–4.5)
Glucose: 129 mg/dL — ABNORMAL HIGH (ref 65–99)
POTASSIUM: 3.7 mmol/L (ref 3.5–5.2)
SODIUM: 139 mmol/L (ref 134–144)
TOTAL PROTEIN: 6.6 g/dL (ref 6.0–8.5)

## 2018-01-22 LAB — LITHIUM LEVEL: Lithium Lvl: 0.4 mmol/L — ABNORMAL LOW (ref 0.6–1.2)

## 2018-01-26 ENCOUNTER — Ambulatory Visit (INDEPENDENT_AMBULATORY_CARE_PROVIDER_SITE_OTHER): Payer: BLUE CROSS/BLUE SHIELD | Admitting: Licensed Clinical Social Worker

## 2018-01-26 ENCOUNTER — Encounter (HOSPITAL_COMMUNITY): Payer: Self-pay | Admitting: Licensed Clinical Social Worker

## 2018-01-26 DIAGNOSIS — F4310 Post-traumatic stress disorder, unspecified: Secondary | ICD-10-CM

## 2018-01-26 DIAGNOSIS — F200 Paranoid schizophrenia: Secondary | ICD-10-CM | POA: Diagnosis not present

## 2018-01-26 NOTE — Progress Notes (Signed)
   THERAPIST PROGRESS NOTE  Session Time: 8:00am-8:45am  Participation Level: Active  Behavioral Response: CasualAlertEuthymic  Type of Therapy: Family Therapy  Treatment Goals addressed: Improve Psychiatric Symptoms, Emotional Regulation Skills, Calming Skills, Healthy Coping Skills, discuss and process traumatic event, reduced irrational worries and fears  Interventions: Motivational Interviewing, Cognitive Behavior Therapy and Grounding/Mindfulness Skills, psychoeducation  Summary: Danielle [name] is a 47 y.o. female who presents with Schizophrenia and PTSD.   Suicidal/Homicidal: No - without intent/plan  Therapist Response:  Tarica met with clinician for an individual session. [name]'s husband joined the session to support her. [name] discussed her psychiatric symptoms, her current life events and her homework. [name] shared that since medication was added by Dr. Leverne Humbles last week, voices have decreased. Clinician explored what was happening with voices and discussed coping skills to decrease the impact of these voices. [name] reported using CBT skills, including thought stopping, reality testing, and talking back to the voices until they quieted down. Clinician discussed anxiety and reminded [name] of gratitude meditation. [name] and clinician completed gratitude meditation and identified the usefulness of this skill in order to reduce and distract from anxiety. [name] and husband discussed recent problems with daughter, who has 21 charges for failure to appear, as well as possession and other drug charges. Spenser reported that daughter asked to be bailed out, as well as to move back into the home. Clinician completed pros and cons list with [name] and husband, identifying that there are many more cons than pros to this situation.    Plan: Return again in 2 weeks.  Diagnosis:     Axis I: Schizophrenia, paranoid type and PTSD  [name], LCSW 01/26/2018

## 2018-02-02 ENCOUNTER — Telehealth (HOSPITAL_COMMUNITY): Payer: Self-pay

## 2018-02-02 ENCOUNTER — Other Ambulatory Visit (HOSPITAL_COMMUNITY): Payer: Self-pay | Admitting: Psychiatry

## 2018-02-02 NOTE — Telephone Encounter (Signed)
Her lithium level is 0.4.  She need to take lithium twice a day.  If symptoms do not improve then she can call us back.  Any time she is having active suicidal thoughts or homicidal thought then she need to call 911 and taken to the emergency room for evaluation.

## 2018-02-02 NOTE — Telephone Encounter (Signed)
Called patients husband and explained the below message, he verbalized understanding

## 2018-02-02 NOTE — Telephone Encounter (Signed)
Patients husband called and said that she is hearing more voices, seeing shadow people, and having nightmares. This started about 2.5 weeks and this is currently getting worse. Please review and advise

## 2018-02-10 ENCOUNTER — Ambulatory Visit (HOSPITAL_COMMUNITY): Payer: Self-pay | Admitting: Licensed Clinical Social Worker

## 2018-02-24 ENCOUNTER — Encounter (HOSPITAL_COMMUNITY): Payer: Self-pay | Admitting: Licensed Clinical Social Worker

## 2018-02-24 ENCOUNTER — Ambulatory Visit (INDEPENDENT_AMBULATORY_CARE_PROVIDER_SITE_OTHER): Payer: BLUE CROSS/BLUE SHIELD | Admitting: Licensed Clinical Social Worker

## 2018-02-24 DIAGNOSIS — F200 Paranoid schizophrenia: Secondary | ICD-10-CM | POA: Diagnosis not present

## 2018-02-24 DIAGNOSIS — F4310 Post-traumatic stress disorder, unspecified: Secondary | ICD-10-CM

## 2018-02-24 NOTE — Progress Notes (Signed)
THERAPIST PROGRESS NOTE  Session Time: 8:00am-8:45am  Participation Level: Active  Behavioral Response: CasualDrowsyDepressed  Type of Therapy: Family Therapy  Treatment Goals addressed: Improve Psychiatric Symptoms, Emotional Regulation Skills, Calming Skills, Healthy Coping Skills, discuss and process traumatic event, reduced irrational worries and fears  Interventions: Motivational Interviewing, Cognitive Behavior Therapy and Grounding/Mindfulness Skills, psychoeducation  Summary: Danielle [name] is a 47 y.o. female who presents with Schizophrenia and PTSD.   Suicidal/Homicidal: No - without intent/plan  Therapist Response:  Danielle [name] met with clinician for an individual session. Danielle [name]'s husband joined the session to support her. Danielle [name] discussed her psychiatric symptoms, her current life events. Danielle [name] shared that she had been admitted to the hospital following a suicide attempt, taking almost 200 pills. She reports that at the time, her auditory hallucinations were extremely loud, aggressive, and distressing. She reports that she spent about 4 days in ICU, trying to get the medicine out of her system, she was then admitted to the behavioral health unit for 8 days. Currently, passive suicidal thoughts are present, as well as some resentment toward husband for calling 911. Clinician discussed trigger to this episode and identified anniversaries of her father and best friend's death, as well as increased stress and worry about her daughter who is currently incarcerated. Clinician explored referral to ACTT services through [facility]. Danielle [name] reports no response from [facility] as of yet. Clinician contacted and left a message for Kathy Connell, enhanced services referral coordinator, and requested a call back. Clinician contracted for safety and discussed alternative options to suicide for help. Clinician also explored coping skills and discussed her importance in the lives of others.   Plan: Return  again in 2 weeks. Follow up with referral to ACTT through [facility]. Will see Dr. Arfeen on 02/27/18 to follow up with medication. Once transition to ACTT is complete, Danielle [name] will transition all care to [facility]. She was informed that once ACTT is complete, she may return to this clinic if she wishes.   Diagnosis:     Axis I: Schizophrenia, paranoid type and PTSD  R , LCSW 02/24/2018  

## 2018-02-27 ENCOUNTER — Ambulatory Visit (INDEPENDENT_AMBULATORY_CARE_PROVIDER_SITE_OTHER): Payer: BLUE CROSS/BLUE SHIELD | Admitting: Psychiatry

## 2018-02-27 ENCOUNTER — Ambulatory Visit (HOSPITAL_COMMUNITY)
Admission: RE | Admit: 2018-02-27 | Discharge: 2018-02-27 | Disposition: A | Discharge status: Home / Self Care | Payer: BLUE CROSS/BLUE SHIELD | Source: Home / Self Care | Attending: Psychiatry | Admitting: Psychiatry

## 2018-02-27 ENCOUNTER — Other Ambulatory Visit: Payer: Self-pay

## 2018-02-27 ENCOUNTER — Encounter

## 2018-02-27 ENCOUNTER — Emergency Department (HOSPITAL_COMMUNITY): Payer: BLUE CROSS/BLUE SHIELD

## 2018-02-27 ENCOUNTER — Inpatient Hospital Stay
Admission: AD | Admit: 2018-02-27 | Discharge: 2018-03-10 | DRG: 885 | Disposition: A | Discharge status: Home / Self Care | Payer: BLUE CROSS/BLUE SHIELD | Source: Intra-hospital | Attending: Psychiatry | Admitting: Psychiatry

## 2018-02-27 ENCOUNTER — Emergency Department (HOSPITAL_COMMUNITY)
Admission: EM | Admit: 2018-02-27 | Discharge: 2018-02-27 | Disposition: A | Discharge status: Other Acute Inpatient Hospital | Payer: BLUE CROSS/BLUE SHIELD | Attending: Emergency Medicine | Admitting: Emergency Medicine

## 2018-02-27 DIAGNOSIS — E039 Hypothyroidism, unspecified: Secondary | ICD-10-CM | POA: Diagnosis present

## 2018-02-27 DIAGNOSIS — Z79890 Hormone replacement therapy (postmenopausal): Secondary | ICD-10-CM

## 2018-02-27 DIAGNOSIS — Z79899 Other long term (current) drug therapy: Secondary | ICD-10-CM | POA: Insufficient documentation

## 2018-02-27 DIAGNOSIS — Y939 Activity, unspecified: Secondary | ICD-10-CM | POA: Insufficient documentation

## 2018-02-27 DIAGNOSIS — R45851 Suicidal ideations: Secondary | ICD-10-CM | POA: Diagnosis present

## 2018-02-27 DIAGNOSIS — W502XXA Accidental twist by another person, initial encounter: Secondary | ICD-10-CM | POA: Insufficient documentation

## 2018-02-27 DIAGNOSIS — G251 Drug-induced tremor: Secondary | ICD-10-CM | POA: Diagnosis not present

## 2018-02-27 DIAGNOSIS — F4310 Post-traumatic stress disorder, unspecified: Secondary | ICD-10-CM

## 2018-02-27 DIAGNOSIS — Z91018 Allergy to other foods: Secondary | ICD-10-CM | POA: Diagnosis not present

## 2018-02-27 DIAGNOSIS — F200 Paranoid schizophrenia: Secondary | ICD-10-CM | POA: Diagnosis present

## 2018-02-27 DIAGNOSIS — G4700 Insomnia, unspecified: Secondary | ICD-10-CM

## 2018-02-27 DIAGNOSIS — Y929 Unspecified place or not applicable: Secondary | ICD-10-CM | POA: Insufficient documentation

## 2018-02-27 DIAGNOSIS — Z7982 Long term (current) use of aspirin: Secondary | ICD-10-CM

## 2018-02-27 DIAGNOSIS — F209 Schizophrenia, unspecified: Secondary | ICD-10-CM | POA: Insufficient documentation

## 2018-02-27 DIAGNOSIS — Z888 Allergy status to other drugs, medicaments and biological substances status: Secondary | ICD-10-CM

## 2018-02-27 DIAGNOSIS — Z5181 Encounter for therapeutic drug level monitoring: Secondary | ICD-10-CM | POA: Diagnosis not present

## 2018-02-27 DIAGNOSIS — G4733 Obstructive sleep apnea (adult) (pediatric): Secondary | ICD-10-CM | POA: Diagnosis present

## 2018-02-27 DIAGNOSIS — F603 Borderline personality disorder: Secondary | ICD-10-CM | POA: Diagnosis present

## 2018-02-27 DIAGNOSIS — I422 Other hypertrophic cardiomyopathy: Secondary | ICD-10-CM | POA: Diagnosis present

## 2018-02-27 DIAGNOSIS — F445 Conversion disorder with seizures or convulsions: Secondary | ICD-10-CM | POA: Diagnosis present

## 2018-02-27 DIAGNOSIS — T43595A Adverse effect of other antipsychotics and neuroleptics, initial encounter: Secondary | ICD-10-CM | POA: Diagnosis not present

## 2018-02-27 DIAGNOSIS — R569 Unspecified convulsions: Secondary | ICD-10-CM

## 2018-02-27 DIAGNOSIS — S93401A Sprain of unspecified ligament of right ankle, initial encounter: Secondary | ICD-10-CM | POA: Diagnosis not present

## 2018-02-27 DIAGNOSIS — R0602 Shortness of breath: Secondary | ICD-10-CM | POA: Diagnosis not present

## 2018-02-27 DIAGNOSIS — R443 Hallucinations, unspecified: Secondary | ICD-10-CM | POA: Insufficient documentation

## 2018-02-27 DIAGNOSIS — Y999 Unspecified external cause status: Secondary | ICD-10-CM | POA: Diagnosis not present

## 2018-02-27 DIAGNOSIS — Z87891 Personal history of nicotine dependence: Secondary | ICD-10-CM

## 2018-02-27 DIAGNOSIS — I10 Essential (primary) hypertension: Secondary | ICD-10-CM | POA: Diagnosis present

## 2018-02-27 DIAGNOSIS — F322 Major depressive disorder, single episode, severe without psychotic features: Secondary | ICD-10-CM | POA: Diagnosis present

## 2018-02-27 DIAGNOSIS — S99911A Unspecified injury of right ankle, initial encounter: Secondary | ICD-10-CM | POA: Diagnosis present

## 2018-02-27 DIAGNOSIS — F419 Anxiety disorder, unspecified: Secondary | ICD-10-CM | POA: Diagnosis not present

## 2018-02-27 DIAGNOSIS — Z915 Personal history of self-harm: Secondary | ICD-10-CM

## 2018-02-27 DIAGNOSIS — G43909 Migraine, unspecified, not intractable, without status migrainosus: Secondary | ICD-10-CM | POA: Diagnosis present

## 2018-02-27 DIAGNOSIS — F410 Panic disorder [episodic paroxysmal anxiety] without agoraphobia: Secondary | ICD-10-CM | POA: Diagnosis present

## 2018-02-27 DIAGNOSIS — G43009 Migraine without aura, not intractable, without status migrainosus: Secondary | ICD-10-CM | POA: Diagnosis not present

## 2018-02-27 DIAGNOSIS — F323 Major depressive disorder, single episode, severe with psychotic features: Secondary | ICD-10-CM | POA: Diagnosis present

## 2018-02-27 LAB — ETHANOL: Alcohol, Ethyl (B): <10 mg/dL (ref <10)

## 2018-02-27 LAB — CBC
HEMATOCRIT: 41.2 % (ref 36.0–46.0)
HEMOGLOBIN: 12.8 g/dL (ref 12.0–15.0)
MCH: 31.5 pg (ref 26.0–34.0)
MCHC: 31.1 g/dL (ref 30.0–36.0)
MCV: 101.5 fL — AB (ref 80.0–100.0)
Platelets: 393 10*3/uL (ref 150–400)
RBC: 4.06 MIL/uL (ref 3.87–5.11)
RDW: 14.2 % (ref 11.5–15.5)
WBC: 14 10*3/uL — AB (ref 4.0–10.5)
nRBC: 0 % (ref 0.0–0.2)

## 2018-02-27 LAB — COMPREHENSIVE METABOLIC PANEL
ALBUMIN: 4.1 g/dL (ref 3.5–5.0)
ALT: 13 U/L (ref 0–44)
AST: 24 U/L (ref 15–41)
Alkaline Phosphatase: 81 U/L (ref 38–126)
Anion gap: 8 (ref 5–15)
BUN: 10 mg/dL (ref 6–20)
CHLORIDE: 101 mmol/L (ref 98–111)
CO2: 27 mmol/L (ref 22–32)
Calcium: 9.6 mg/dL (ref 8.9–10.3)
Creatinine, Ser: 1.11 mg/dL — ABNORMAL HIGH (ref 0.44–1.00)
GFR calc Af Amer: >60 mL/min (ref >60)
GFR calc non Af Amer: 57 mL/min — ABNORMAL LOW (ref >60)
GLUCOSE: 96 mg/dL (ref 70–99)
Potassium: 4.2 mmol/L (ref 3.5–5.1)
SODIUM: 136 mmol/L (ref 135–145)
Total Bilirubin: 1.5 mg/dL — ABNORMAL HIGH (ref 0.3–1.2)
Total Protein: 6.8 g/dL (ref 6.5–8.1)

## 2018-02-27 LAB — RAPID URINE DRUG SCREEN, HOSP PERFORMED
Amphetamines: NOT DETECTED
BARBITURATES: POSITIVE — AB
Benzodiazepines: NOT DETECTED
COCAINE: NOT DETECTED
Opiates: NOT DETECTED
TETRAHYDROCANNABINOL: NOT DETECTED

## 2018-02-27 LAB — I-STAT BETA HCG BLOOD, ED (MC, WL, AP ONLY): I-stat hCG, quantitative: <5 m[IU]/mL (ref <5)

## 2018-02-27 MED ORDER — ACETAMINOPHEN 325 MG PO TABS: 650.0000 mg | ORAL_TABLET | Freq: Four times a day (QID) | ORAL | Status: DC | PRN

## 2018-02-27 MED ORDER — HYDROCHLOROTHIAZIDE 25 MG PO TABS: 12.5000 mg | ORAL_TABLET | Freq: Every day | ORAL | Status: DC

## 2018-02-27 MED ORDER — CLONAZEPAM 0.5 MG PO TABS
0.5000 mg | ORAL_TABLET | Freq: Four times a day (QID) | ORAL | Status: DC | PRN
  Administered 2018-02-28 – 2018-03-02 (×5): 0.5 mg via ORAL
  Filled 2018-02-27 (×5): qty 1

## 2018-02-27 MED ORDER — BUTALBITAL-APAP-CAFFEINE 50-325-40 MG PO TABS: 1.0000 | ORAL_TABLET | Freq: Once | ORAL | Status: DC

## 2018-02-27 MED ORDER — PRAZOSIN HCL 1 MG PO CAPS: 2.0000 mg | ORAL_CAPSULE | Freq: Every day | ORAL | Status: DC

## 2018-02-27 MED ORDER — SODIUM CHLORIDE 0.9 % IV BOLUS
500.0000 mL | Freq: Once | INTRAVENOUS | Status: AC
  Administered 2018-02-27: 500 mL via INTRAVENOUS

## 2018-02-27 MED ORDER — ALBUTEROL SULFATE HFA 108 (90 BASE) MCG/ACT IN AERS: 2.0000 | INHALATION_SPRAY | Freq: Four times a day (QID) | RESPIRATORY_TRACT | Status: DC | PRN

## 2018-02-27 MED ORDER — PROCHLORPERAZINE EDISYLATE 10 MG/2ML IJ SOLN
10.0000 mg | Freq: Once | INTRAMUSCULAR | Status: AC
  Administered 2018-02-27: 10 mg via INTRAVENOUS
  Filled 2018-02-27: qty 2

## 2018-02-27 MED ORDER — FOLIC ACID 1 MG PO TABS
1.0000 mg | ORAL_TABLET | Freq: Every day | ORAL | Status: DC
  Administered 2018-02-28 – 2018-03-10 (×11): 1 mg via ORAL
  Filled 2018-02-27 (×11): qty 1

## 2018-02-27 MED ORDER — LEVOTHYROXINE SODIUM 100 MCG PO TABS: 100.0000 ug | ORAL_TABLET | Freq: Every day | ORAL | Status: DC

## 2018-02-27 MED ORDER — IPRATROPIUM-ALBUTEROL 0.5-2.5 (3) MG/3ML IN SOLN
3.0000 mL | Freq: Once | RESPIRATORY_TRACT | Status: AC
  Administered 2018-02-27: 3 mL via RESPIRATORY_TRACT
  Filled 2018-02-27: qty 3

## 2018-02-27 MED ORDER — HYDROCHLOROTHIAZIDE 25 MG PO TABS
12.5000 mg | ORAL_TABLET | Freq: Every day | ORAL | Status: DC
  Administered 2018-02-28 – 2018-03-10 (×11): 12.5 mg via ORAL
  Filled 2018-02-27 (×11): qty 1

## 2018-02-27 MED ORDER — DILTIAZEM HCL ER 120 MG PO CP24: 120.0000 mg | ORAL_CAPSULE | Freq: Every day | ORAL | Status: DC

## 2018-02-27 MED ORDER — MAGNESIUM HYDROXIDE 400 MG/5ML PO SUSP: 30.0000 mL | Freq: Every day | ORAL | Status: DC | PRN

## 2018-02-27 MED ORDER — IBUPROFEN 800 MG PO TABS
800.0000 mg | ORAL_TABLET | Freq: Three times a day (TID) | ORAL | Status: DC | PRN
  Administered 2018-02-28 – 2018-03-08 (×3): 800 mg via ORAL
  Filled 2018-02-27 (×4): qty 1

## 2018-02-27 MED ORDER — LEVOTHYROXINE SODIUM 100 MCG PO TABS
100.0000 ug | ORAL_TABLET | Freq: Every day | ORAL | Status: DC
  Administered 2018-02-28 – 2018-03-10 (×11): 100 ug via ORAL
  Filled 2018-02-27 (×11): qty 1

## 2018-02-27 MED ORDER — ALUM & MAG HYDROXIDE-SIMETH 200-200-20 MG/5ML PO SUSP
30.0000 mL | ORAL | Status: DC | PRN
  Administered 2018-03-08: 30 mL via ORAL
  Filled 2018-02-27: qty 30

## 2018-02-27 MED ORDER — LORAZEPAM 2 MG/ML IJ SOLN: 2.0000 mg | Freq: Once | INTRAMUSCULAR | Status: DC

## 2018-02-27 MED ORDER — TRAZODONE HCL 100 MG PO TABS
100.0000 mg | ORAL_TABLET | Freq: Every evening | ORAL | Status: DC | PRN
  Administered 2018-02-28: 100 mg via ORAL
  Filled 2018-02-27: qty 1

## 2018-02-27 MED ORDER — LITHIUM CARBONATE ER 450 MG PO TBCR
450.0000 mg | EXTENDED_RELEASE_TABLET | Freq: Every day | ORAL | Status: DC
  Administered 2018-02-28 – 2018-03-02 (×3): 450 mg via ORAL
  Filled 2018-02-27 (×4): qty 1

## 2018-02-27 MED ORDER — DILTIAZEM HCL ER COATED BEADS 120 MG PO CP24
120.0000 mg | ORAL_CAPSULE | Freq: Every day | ORAL | Status: DC
  Administered 2018-02-28 – 2018-03-10 (×11): 120 mg via ORAL
  Filled 2018-02-27 (×11): qty 1

## 2018-02-27 MED ORDER — ASPIRIN EC 81 MG PO TBEC
81.0000 mg | DELAYED_RELEASE_TABLET | Freq: Every day | ORAL | Status: DC
  Administered 2018-02-28 – 2018-03-10 (×11): 81 mg via ORAL
  Filled 2018-02-27 (×11): qty 1

## 2018-02-27 MED ORDER — KETOROLAC TROMETHAMINE 30 MG/ML IJ SOLN
30.0000 mg | Freq: Once | INTRAMUSCULAR | Status: AC
  Administered 2018-02-27: 30 mg via INTRAVENOUS
  Filled 2018-02-27: qty 1

## 2018-02-27 MED ORDER — DIPHENHYDRAMINE HCL 50 MG/ML IJ SOLN
25.0000 mg | Freq: Once | INTRAMUSCULAR | Status: AC
  Administered 2018-02-27: 25 mg via INTRAVENOUS
  Filled 2018-02-27: qty 1

## 2018-02-27 MED ORDER — LORAZEPAM 2 MG/ML IJ SOLN
2.0000 mg | Freq: Once | INTRAMUSCULAR | Status: AC
  Administered 2018-02-27: 2 mg via INTRAVENOUS
  Filled 2018-02-27: qty 1

## 2018-02-27 NOTE — ED Notes (Signed)
[facility] to [place]. [facility]

## 2018-02-27 NOTE — ED Triage Notes (Signed)
Pt sent from [facility] by [facility] for medical clearance. Pt reports that she has hx migraines, started today about 3-4 hours. Reports this morning she was getting at the refrigerator when she twisted her right ankle and c/o pain. Pt ambulatory with steady gait from lobby to triage room.   Pt reports that she had hallucinations for years but "lately they have been getting bad". Pt specifies "lately" means 3 weeks.  Hallucinations tell her that she is ugly, stupid and worthless.   Adds that 3 weeks ago she attempted SI by overdosing on 200 pills. States that she was sent to [facility] to get her medications adjusted but they didn't change them and sent her to [facility], who didn't adjust her medications either. Today she saw Dr Vladimir Faster (? Spelling) and was told to go for medical clearance for medication adjustment.

## 2018-02-27 NOTE — Progress Notes (Addendum)
Patient is a new admit in the unit from [place] as voluntary committed present with hearing voices, suicide ideation with a plan to take 200 tablets of zanaflex,  Patient stated her daughter tried to OD on heroin and she was saved by given her Narcan inj, also patient said she thinks about her father who is deceases long ago, states she is having flash backs and nightmares. Therapeutic support was provided, unit guide lines and expected behavior was discussed, patient contracted for safety of self and others, unit and room orientation was provided, hygiene products was provided as well. Skin and body check was done by two nurses RN, no contraband found and skin is clean, noted edema +1 on bilateral lower extremities, also noted patient has speech impairment and slow to response. Otherwise patient is calm and cooperative with assessment proceedings, denies any SI/HI and AVH at this time, no the distress.    Placed patient on C-PAP with orders for  difficulties breathing during sleep, patient tolerated well 02 sats 98% with C-PAP noted.                    p

## 2018-02-27 NOTE — BH Assessment (Signed)
Patient has been accepted to [facility].  Accepting physician is Dr. Toni Amend.  Attending Physician will be Dr. Johnella Moloney.  Patient has been assigned to room 303, by [facility] Charge Nurse Shatara.  Call report to [phone].  Representative/Transfer Coordinator is Warden/ranger Patient pre-admitted by [facility] Patient Access ([place])  [facility] Staff Inetta Fermo T., [facility]) made aware of acceptance.  Bed available after 8:30pm

## 2018-02-27 NOTE — ED Notes (Signed)
Bed: [facility]4 Expected date:  Expected time:  Means of arrival:  Comments: 

## 2018-02-27 NOTE — ED Notes (Signed)
Pt becoming increasingly anxious, tearful. Reports the voices are worsening. This nurse notified EDP. Awaiting orders.

## 2018-02-27 NOTE — Consult Note (Signed)
  Psychiatry: Chart reviewed at TTS request.  Patient appears to be appropriate for admission to psychiatric unit.  Orders completed for transfer admission to [facility]

## 2018-02-27 NOTE — ED Notes (Signed)
Pt to room #26. Reports AH telling her she is worthless. Endorsing SI. Encouragement and support provided. Safety sitter present. Will continue to monitor.

## 2018-02-27 NOTE — H&P (Addendum)
Behavioral Health Medical Screening Exam  Danielle [name] is an 47 y.o. female.  Patient is seen by me face-to-face.  Patient had presented to the behavioral health Hospital for walk-in intake.  Patient presented in a wheelchair.  She reported that she twisted her ankle this morning and was needing wheelchair for assistance for ambulation.  Patient did not go to the emergency department to have her ankle examined.  Patient also reports that she is having a severe headache today which is a 7 out of 10.  Total Time spent with patient: 20 minutes  Psychiatric Specialty Exam: Physical Exam  Nursing note and vitals reviewed. Constitutional: She is oriented to person, place, and time. She appears well-developed and well-nourished.  Cardiovascular: Normal rate.  Respiratory: Effort normal.  Neurological: She is alert and oriented to person, place, and time.  Skin: Skin is warm.    Review of Systems  Constitutional: Negative.   HENT: Negative.   Eyes: Negative.   Respiratory: Negative.   Cardiovascular: Negative.   Gastrointestinal: Negative.   Genitourinary: Negative.   Musculoskeletal: Negative.   Skin: Negative.   Neurological: Positive for headaches.  Endo/Heme/Allergies: Negative.   Psychiatric/Behavioral: Positive for depression, hallucinations and suicidal ideas. The patient is nervous/anxious.     Blood pressure 111/90, pulse 81, temperature 98.2 F (36.8 C), resp. rate 16, SpO2 94 %.There is no height or weight on file to calculate BMI.  General Appearance: Disheveled and Guarded  Eye Contact:  Fair  Speech:  Slow and Slurred  Volume:  Decreased  Mood:  Anxious and Depressed  Affect:  Constricted  Thought Process:  Coherent and Descriptions of Associations: Circumstantial  Orientation:  Full (Time, Place, and Person)  Thought Content:  Hallucinations: Auditory  Suicidal Thoughts:  Yes.  with intent/plan  Homicidal Thoughts:  No  Memory:  Immediate;   Fair Recent;    Fair Remote;   Fair  Judgement:  Poor  Insight:  Fair  Psychomotor Activity:  Decreased  Concentration: Concentration: Fair  Recall:  [employer] of Knowledge:Fair  Language: Poor  Akathisia:  No  Handed:  Right  AIMS (if indicated):     Assets:  Desire for Improvement Financial Resources/Insurance Housing Social Support Transportation  Sleep:       Musculoskeletal: Strength & Muscle Tone: decreased Gait & Station: unsteady Patient leans: N/A  Blood pressure 111/90, pulse 81, temperature 98.2 F (36.8 C), resp. rate 16, SpO2 94 %.  Recommendations:  Based on my evaluation the patient appears to have an emergency medical condition for which I recommend the patient be transferred to the emergency department for further evaluation.  Gerlene Burdock Nazier Neyhart, FNP 02/27/2018, 1:45 PM

## 2018-02-27 NOTE — ED Notes (Signed)
Off floor with xray 

## 2018-02-27 NOTE — ED Notes (Signed)
Patient wanded by security. One labeled patient belongings bag transferred with patient.

## 2018-02-27 NOTE — ED Provider Notes (Signed)
[facility]-EMERGENCY DEPT Provider Note   CSN: [id number] Arrival date & time: 02/27/18  1441     History   Chief Complaint Chief Complaint  Patient presents with  . Migraine  . Ankle Pain  . Hallucinations    HPI Danielle [name] is a 47 y.o. female.  Danielle [name] is a 47 y.o. Female with a history of schizophrenia, PTSD, depression and anxiety, as well as seizures, and migraines, who presents to the emergency department from behavioral health for medical clearance.  Patient presented to behavioral health walk-in clinic today complaining of worsening hallucinations, 3 months ago she attempted SI overdosing on her 200 pills for which she was taken to [facility] and admitted at [facility], she reports that they did not adjust her medications at all while she was there so she went to see her psychiatrist, Dr. Lolly Mustache because her hallucinations were becoming more persistent.  She reports a voices tell her that she is "ugly stupid and worthless and that she should not live anymore.  She reports that she does not feel suicidal but wants to do something to make the voices stop, denies any homicidal ideations.  Patient reports that she has history of migraines in about 3 to 4 hours prior to arrival started having a migraine, she reports this is located over the left side of her head and presents exactly like her usual migraine, some light sensitivity but no vision changes, vomiting, numbness, weakness, facial asymmetry or change in speech.  She also reports that this morning she twisted her right ankle while walking into her kitchen to the refrigerator, reports history of prior fracture in that leg with a rod going through to her ankle and she intermittently has pain here, it is been painful to bear weight on this ankle, mild swelling, no color change or other abnormalities reported.  Nothing for pain prior to arrival.  She reports she has had  some wheezing intermittently and while she was in [facility] they were giving her breathing treatments and steroids to help with this she reports she feels it is improving she is not having any chest pain.  No abdominal pain, nausea or vomiting, no fevers or chills.     Past Medical History:  Diagnosis Date  . Anemia   . Anxiety   . Bipolar 1 disorder (HCC)   . Depressed   . Headache(784.0)   . Heart murmur   . Hypothyroidism   . PTSD (post-traumatic stress disorder)   . Schizophrenia (HCC)   . Seizures (HCC)   . Shortness of breath     Patient Active Problem List   Diagnosis Date Noted  . Frequent falls 06/18/2015  . Tobacco use disorder 06/08/2015  . Hyperammonemia (HCC) 06/08/2015  . Paranoid schizophrenia (HCC)   . Migraine headache 06/04/2012  . Seizure (HCC) 06/04/2012  . Hypothyroidism 06/04/2012  . OSA (obstructive sleep apnea) 08/29/2011  . Post traumatic stress disorder (PTSD) 07/31/2011    Past Surgical History:  Procedure Laterality Date  . FOOT SURGERY    . LEG SURGERY    . NASAL FRACTURE SURGERY    . TUBAL LIGATION  1996     OB History   None      Home Medications    Prior to Admission medications   Medication Sig Start Date End Date Taking? Authorizing Provider  acetaminophen (TYLENOL 8 HOUR) 650 MG CR tablet Take 1 tablet (650 mg total) by mouth every  8 (eight) hours as needed for pain. Patient not taking: Reported on 02/27/2018 10/03/16   Derwood Kaplan, MD  aspirin EC 81 MG tablet Take 1 tablet (81 mg total) by mouth daily with breakfast. Patient not taking: Reported on 02/27/2018 06/16/15   Pucilowska, Ellin Goodie, MD  clonazePAM (KLONOPIN) 0.5 MG tablet Take 1 tablet (0.5 mg total) by mouth 4 (four) times daily as needed for anxiety. 12/30/17   Cobos, Rockey Situ, MD  diltiazem (DILACOR XR) 120 MG 24 hr capsule Take 120 mg by mouth daily. 11/26/16   [provider]  eletriptan (RELPAX) 40 MG tablet TAKE 1 TAB AT ONSET OF HEADACHES.  MAY REPEAT IN 2 HOURS.MAX 2/24 HRS. FOR 30 DAYS 07/22/16   [provider]  folic acid (FOLVITE) 1 MG tablet Take 1 tablet (1 mg total) by mouth daily. 06/16/15   Pucilowska, Jolanta B, MD  hydrochlorothiazide (HYDRODIURIL) 12.5 MG tablet TAKE ONE TABLET (12.5 MG DOSE) BY MOUTH DAILY. TAKE ON TABLET BY MOUTH ONCE A DAY IN THE MORNING. 01/06/18   [provider]  ibuprofen (ADVIL,MOTRIN) 800 MG tablet Take 800 mg by mouth every 8 (eight) hours as needed (pain).    [provider]  levothyroxine (SYNTHROID, LEVOTHROID) 100 MCG tablet TAKE ONE TABLET (100 MCG DOSE) BY MOUTH DAILY. 09/01/17   [provider]  lithium carbonate (ESKALITH) 450 MG CR tablet Take 1 tablet (450 mg total) by mouth daily. 12/30/17   Cobos, Rockey Situ, MD  loxapine (LOXITANE) 50 MG capsule Take 1 capsule (50 mg total) by mouth 2 (two) times daily. for acute psychosis 01/21/18   Mariel Craft, MD  PARoxetine (PAXIL-CR) 37.5 MG 24 hr tablet Take 1 tablet (37.5 mg total) by mouth every morning. 12/30/17   Cobos, Rockey Situ, MD  POTASSIUM PO Take by mouth.    [provider]  prazosin (MINIPRESS) 2 MG capsule Take 1 capsule (2 mg total) by mouth at bedtime. 12/30/17   Cobos, Rockey Situ, MD  TROKENDI XR 100 MG CP24 Take 1 tablet by mouth daily. 08/16/17   [provider]  TROKENDI XR 200 MG CP24 Take 1 tablet by mouth daily. 08/16/17   [provider]  Vitamin D, Ergocalciferol, (DRISDOL) 50000 units CAPS capsule Take 50,000 Units by mouth every 7 (seven) days. saturday    [provider]  zolpidem (AMBIEN) 5 MG tablet Take 1 tablet (5 mg total) by mouth at bedtime as needed for sleep. 03/22/11 07/29/11  Mechele Dawley, NP    Family History Family History  Adopted: Yes  Problem Relation Age of Onset  . Drug abuse Daughter        heroin addiction    Social History Social History   Tobacco Use  . Smoking status: Former Smoker    Packs/day: 1.50    Years:  30.00    Pack years: 45.00    Types: Cigarettes    Last attempt to quit: 09/20/2011    Years since quitting: 6.4  . Smokeless tobacco: Current User  Substance Use Topics  . Alcohol use: No    Alcohol/week: 0.0 standard drinks  . Drug use: No     Allergies   Tomato; Imitrex [sumatriptan base]; and Mustard seed   Review of Systems Review of Systems  Constitutional: Negative for chills and fever.  HENT: Negative.   Eyes: Negative for visual disturbance.  Respiratory: Positive for shortness of breath and wheezing. Negative for cough and chest tightness.   Cardiovascular: Negative for  chest pain.  Gastrointestinal: Negative for abdominal pain, nausea and vomiting.  Musculoskeletal: Positive for arthralgias and joint swelling.  Skin: Negative for color change and rash.  Neurological: Negative for dizziness, weakness and numbness.  Psychiatric/Behavioral: Positive for dysphoric mood, hallucinations and suicidal ideas. Negative for agitation.     Physical Exam Updated Vital Signs BP 113/68 (BP Location: Left Arm)   Pulse 70   Temp 98.4 F (36.9 C) (Oral)   Resp 20   SpO2 100%   Physical Exam  Constitutional: She is oriented to person, place, and time. She appears well-developed and well-nourished. No distress.  HENT:  Head: Normocephalic and atraumatic.  Mouth/Throat: Oropharynx is clear and moist.  Eyes: Pupils are equal, round, and reactive to light. EOM are normal. Right eye exhibits no discharge. Left eye exhibits no discharge.  Neck: Neck supple.  Cardiovascular: Normal rate, regular rhythm, normal heart sounds and intact distal pulses.  Pulmonary/Chest: Effort normal. No respiratory distress. She has wheezes. She has no rales.  Respirations equal and unlabored, patient able to speak in full sentences, lungs with some faint wheezes scattered throughout bilaterally but with good air movement  Abdominal: Soft. Bowel sounds are normal. She exhibits no distension and no  mass. There is no tenderness. There is no guarding.  Abdomen soft, nondistended, nontender to palpation in all quadrants without guarding or peritoneal signs  Musculoskeletal:  Mild tenderness over the lateral malleolus of the right ankle without palpable deformity there is no overlying redness or warmth, dorsi and plantar flexion are intact, 2+ DP and PT pulses and good capillary refill, sensation intact, no significant joint laxity  Neurological: She is alert and oriented to person, place, and time. Coordination normal.  Speech is clear, able to follow commands CN III-XII intact Normal strength in upper and lower extremities bilaterally including dorsiflexion and plantar flexion, strong and equal grip strength Sensation normal to light and sharp touch Moves extremities without ataxia, coordination intact  Skin: Skin is warm and dry. Capillary refill takes less than 2 seconds. She is not diaphoretic.  Psychiatric: Her speech is normal. She is withdrawn. She exhibits a depressed mood. She expresses suicidal ideation. She expresses no homicidal ideation. She expresses no suicidal plans and no homicidal plans.  Nursing note and vitals reviewed.    ED Treatments / Results  Labs (all labs ordered are listed, but only abnormal results are displayed) Labs Reviewed  COMPREHENSIVE METABOLIC PANEL - Abnormal; Notable for the following components:      Result Value   Creatinine, Ser 1.11 (*)    Total Bilirubin 1.5 (*)    GFR calc non Af Amer 57 (*)    All other components within normal limits  CBC - Abnormal; Notable for the following components:   WBC 14.0 (*)    MCV 101.5 (*)    All other components within normal limits  RAPID URINE DRUG SCREEN, HOSP PERFORMED - Abnormal; Notable for the following components:   Barbiturates POSITIVE (*)    All other components within normal limits  ETHANOL  I-STAT BETA HCG BLOOD, ED ([facility], [facility], [facility] ONLY)    EKG None  Radiology Dg Chest 2 View  Result  Date: 02/27/2018 CLINICAL DATA:  46 year old female with wheezing.  Ankle injury. EXAM: CHEST - 2 VIEW COMPARISON:  12/23/2015 chest radiographs and earlier. FINDINGS: Chronic bilateral lateral rib fractures. Cardiac size is stable at the upper limits of normal. Other mediastinal contours are within normal limits. Visualized tracheal air column is within normal  limits. Chronically somewhat low lung volumes with no pneumothorax, pulmonary edema, pleural effusion or consolidation. Middle lobe curvilinear opacity is chronic and could be atelectasis or scarring. No acute osseous abnormality identified. Negative visible bowel gas pattern. IMPRESSION: Chronically low lung volumes with middle lobe atelectasis or scarring suspected. No acute cardiopulmonary abnormality. Electronically Signed   By: Odessa Fleming M.D.   On: 02/27/2018 17:50   Dg Ankle Complete Right  Result Date: 02/27/2018 CLINICAL DATA:  47 year old female with twisting injury to the right ankle this morning. Prior ankle surgery. EXAM: RIGHT ANKLE - COMPLETE 3+ VIEW COMPARISON:  None. FINDINGS: Tibia intramedullary rod is in place. There is no distal interlocking hardware present at this time. There ear scratched at there are healed distal tibia and fibula metadiaphysis fractures proximal to the ankle joint. Mortise joint alignment is maintained. Talar dome intact. No evidence of joint effusion. Medial and lateral malleolus appear intact. Calcaneus and visible bones of the right foot appear intact. IMPRESSION: 1. No acute fracture or dislocation identified about the right ankle. 2. Healed distal tibia and fibula metadiaphysis fractures with partially visible tibia intramedullary rod. Electronically Signed   By: Odessa Fleming M.D.   On: 02/27/2018 17:52    Procedures Procedures (including critical care time)  Medications Ordered in ED Medications  diltiazem (DILACOR XR) 24 hr capsule 120 mg (has no administration in time range)  hydrochlorothiazide  (HYDRODIURIL) tablet 12.5 mg (has no administration in time range)  levothyroxine (SYNTHROID, LEVOTHROID) tablet 100 mcg (has no administration in time range)  prazosin (MINIPRESS) capsule 2 mg (has no administration in time range)  butalbital-acetaminophen-caffeine (FIORICET, ESGIC) 50-325-40 MG per tablet 1 tablet (has no administration in time range)  albuterol (PROVENTIL HFA;VENTOLIN HFA) 108 (90 Base) MCG/ACT inhaler 2 puff (has no administration in time range)  ipratropium-albuterol (DUONEB) 0.5-2.5 (3) MG/3ML nebulizer solution 3 mL (3 mLs Nebulization Given 02/27/18 1621)  sodium chloride 0.9 % bolus 500 mL (500 mLs Intravenous New Bag/Given 02/27/18 1710)  ketorolac (TORADOL) 30 MG/ML injection 30 mg (30 mg Intravenous Given 02/27/18 1743)  prochlorperazine (COMPAZINE) injection 10 mg (10 mg Intravenous Given 02/27/18 1745)  diphenhydrAMINE (BENADRYL) injection 25 mg (25 mg Intravenous Given 02/27/18 1741)  LORazepam (ATIVAN) injection 2 mg (2 mg Intravenous Given 02/27/18 1832)     Initial Impression / Assessment and Plan / ED Course  I have reviewed the triage vital signs and the nursing notes.  Pertinent labs & imaging results that were available during my care of the patient were reviewed by me and considered in my medical decision making (see chart for details).  Patient presents from behavioral health for medical clearance, was seen at behavioral health for worsening hallucinations, history of the same, history of overdose a few weeks ago with psychiatric hospitalization but no medication changes were made at present patient.  She reports voices telling her that she is worthless, away and stupid and that she should die, she reports she is not actively suicidal at this moment, but has felt suicidal and just wants these voices to end.  Behavioral health recommends inpatient hospitalization but she was sent for medical clearance that she was also complaining of migraine headache, has  history of chronic migraines and reports this headache is no different from her usual, she has no neurologic deficits and headache resolved with migraine cocktail and fioricet.  Patient also reports some intermittent shortness of breath while hospitalized in [place], was on steroids and breathing treatments at this time, lungs are overall  clear to auscultation aside from some faint scattered wheezing will give breathing treatment here.  Patient also reports that she rolled her right ankle, history of prior surgery in this area and has had some pain there since, the ankle is neurovascularly intact we will get basic labs, chest x-ray, and x-ray of the right ankle.  Labs overall unremarkable, leukocytosis of 14.0 but I attribute this to recent steroid use, normal hemoglobin, no acute electrolyte derangements requiring intervention, normal renal and liver function, negative ethanol level, UDS positive for barbiturates but otherwise negative, negative pregnancy.  Chest x-ray shows some chronic tearing but no acute cardiopulmonary disease.  X-ray of the right ankle shows prior surgical changes but no acute fracture, because patient is going to a ligature free behavioral health hospital she cannot use ASO brace, exam and with fairly mild pain feel she is stable to walk around with Tylenol as needed for discomfort.  Headache has resolved with treatment here in the emergency department.  6:09 PM notified by nursing that patient is becoming increasingly anxious and tearful reporting worsening hallucinations, will give 2 mg IV Ativan.  Patient is medically cleared for behavioral health, she has bed assignment at [facility] and will be transferred after 8:30 PM this evening.  Final Clinical Impressions(s) / ED Diagnoses   Final diagnoses:  Hallucinations  Sprain of right ankle, unspecified ligament, initial encounter  Migraine without aura and without status migrainosus, not  intractable  SOB (shortness of breath)    ED Discharge Orders    None       Legrand Rams 02/27/18 2049    Charlynne Pander, MD 02/27/18 2257

## 2018-02-27 NOTE — Progress Notes (Signed)
BH MD/PA/NP OP Progress Note  02/27/2018 10:31 AM Danielle [name]  MRN:  [id number]  Chief Complaint: I am hearing voices.  I have suicidal thoughts to take overdose.  HPI: Danielle [name] came for her appointment with her husband.  Patient was recently admitted in [facility] after taking overdose of 200 tablet of Tinazidine.  As per husband who provide most of the information patient started to hearing voices after she learned that her daughter tried to overdose on heroin and she was saved by giving Narcan injection.  She also thinking about her father who is deceased.  Her husband told that in the hospital no medicines were changed and she is discharged on the same medication.  Patient continued to have hallucination, paranoia, suicidal thoughts and plan to take her own life.  Patient is taking multiple medication and does not seems to be getting better.  We have discussed in the past ACT however due to address at [facility] she is not eligible to get services from [facility].  Patient is not sleeping well.  She gets easily scared and having nightmares and flashback.  Patient does not contract for safety and requires inpatient treatment.  Visit Diagnosis:    ICD-10-CM   1. Paranoid schizophrenia (HCC) F20.0   2. Posttraumatic stress disorder F43.10     Past Psychiatric History: Reviewed.  Past Medical History:  Past Medical History:  Diagnosis Date  . Anemia   . Anxiety   . Bipolar 1 disorder (HCC)   . Depressed   . Headache(784.0)   . Heart murmur   . Hypothyroidism   . PTSD (post-traumatic stress disorder)   . Schizophrenia (HCC)   . Seizures (HCC)   . Shortness of breath     Past Surgical History:  Procedure Laterality Date  . FOOT SURGERY    . LEG SURGERY    . NASAL FRACTURE SURGERY    . TUBAL LIGATION  1996    Family Psychiatric History: Reviewed.  Family History:  Family History  Adopted: Yes  Problem Relation Age of Onset  . Drug abuse Daughter        heroin  addiction    Social History:  Social History   Socioeconomic History  . Marital status: Married    Spouse name: Not on file  . Number of children: 2  . Years of education: Not on file  . Highest education level: Not on file  Occupational History  . Occupation: unemployed    Associate Professor: NOT EMPLOYED  Social Needs  . Financial resource strain: Not on file  . Food insecurity:    Worry: Not on file    Inability: Not on file  . Transportation needs:    Medical: Not on file    Non-medical: Not on file  Tobacco Use  . Smoking status: Former Smoker    Packs/day: 1.50    Years: 30.00    Pack years: 45.00    Types: Cigarettes    Last attempt to quit: 09/20/2011    Years since quitting: 6.4  . Smokeless tobacco: Current User  Substance and Sexual Activity  . Alcohol use: No    Alcohol/week: 0.0 standard drinks  . Drug use: No  . Sexual activity: Yes    Partners: Male    Birth control/protection: Surgical  Lifestyle  . Physical activity:    Days per week: Not on file    Minutes per session: Not on file  . Stress: Not on file  Relationships  . Social  connections:    Talks on phone: Not on file    Gets together: Not on file    Attends religious service: Not on file    Active member of club or organization: Not on file    Attends meetings of clubs or organizations: Not on file    Relationship status: Not on file  Other Topics Concern  . Not on file  Social History Narrative   Pt is adopted. Unsure of family history.    Allergies:  Allergies  Allergen Reactions  . Tomato Rash    Pt reports that she breaks out in a rash if she eats tomato based foods but can eat a whole tomato  . Imitrex [Sumatriptan Base] Hives  . Mustard Seed Rash    Also allergic to [facility]    Metabolic Disorder Labs: Lab Results  Component Value Date   HGBA1C 5.9 (H) 06/26/2017   MPG 123 (H) 02/05/2012   MPG 114 09/22/2011   Lab Results  Component Value Date   PROLACTIN 39.1 (H) 06/08/2015    Lab Results  Component Value Date   CHOL 218 (H) 06/08/2015   TRIG 215 (H) 06/08/2015   HDL 36 (L) 06/08/2015   CHOLHDL 6.1 06/08/2015   VLDL 43 (H) 06/08/2015   LDLCALC 139 (H) 06/08/2015   LDLCALC UNABLE TO CALCULATE IF TRIGLYCERIDE OVER 400 mg/dL 69/62/9528   Lab Results  Component Value Date   TSH 2.250 12/30/2017   TSH 1.782 06/08/2015    Therapeutic Level Labs: Lab Results  Component Value Date   LITHIUM 0.4 (L) 01/21/2018   LITHIUM 0.7 12/30/2017   Lab Results  Component Value Date   VALPROATE 12.7 06/10/2015   VALPROATE 62 06/08/2015   No components found for:  CBMZ  Current Medications: Current Outpatient Medications  Medication Sig Dispense Refill  . acetaminophen (TYLENOL 8 HOUR) 650 MG CR tablet Take 1 tablet (650 mg total) by mouth every 8 (eight) hours as needed for pain. 30 tablet 0  . ALBUTEROL IN Inhale into the lungs.    Marland Kitchen aspirin EC 81 MG tablet Take 1 tablet (81 mg total) by mouth daily with breakfast. 30 tablet 0  . clonazePAM (KLONOPIN) 0.5 MG tablet Take 1 tablet (0.5 mg total) by mouth 4 (four) times daily as needed for anxiety. 120 tablet 1  . diltiazem (DILACOR XR) 120 MG 24 hr capsule Take 120 mg by mouth daily.    Marland Kitchen eletriptan (RELPAX) 40 MG tablet TAKE 1 TAB AT ONSET OF HEADACHES. MAY REPEAT IN 2 HOURS.MAX 2/24 HRS. FOR 30 DAYS  6  . folic acid (FOLVITE) 1 MG tablet Take 1 tablet (1 mg total) by mouth daily. 30 tablet 0  . hydrochlorothiazide (HYDRODIURIL) 12.5 MG tablet TAKE ONE TABLET (12.5 MG DOSE) BY MOUTH DAILY. TAKE ON TABLET BY MOUTH ONCE A DAY IN THE MORNING.  0  . ibuprofen (ADVIL,MOTRIN) 800 MG tablet Take 800 mg by mouth every 8 (eight) hours as needed (pain).    Marland Kitchen levothyroxine (SYNTHROID, LEVOTHROID) 100 MCG tablet TAKE ONE TABLET (100 MCG DOSE) BY MOUTH DAILY.  5  . lithium carbonate (ESKALITH) 450 MG CR tablet Take 1 tablet (450 mg total) by mouth daily. 90 tablet 0  . loxapine (LOXITANE) 50 MG capsule Take 1 capsule (50 mg  total) by mouth 2 (two) times daily. for acute psychosis 180 capsule 0  . PARoxetine (PAXIL-CR) 37.5 MG 24 hr tablet Take 1 tablet (37.5 mg total) by mouth every morning. 90 tablet  0  . POTASSIUM PO Take by mouth.    . prazosin (MINIPRESS) 2 MG capsule Take 1 capsule (2 mg total) by mouth at bedtime. 90 capsule 0  . TROKENDI XR 100 MG CP24 Take 1 tablet by mouth daily.  3  . TROKENDI XR 200 MG CP24 Take 1 tablet by mouth daily.  3  . Vitamin D, Ergocalciferol, (DRISDOL) 50000 units CAPS capsule Take 50,000 Units by mouth every 7 (seven) days. saturday     No current facility-administered medications for this visit.      Musculoskeletal: Strength & Muscle Tone: within normal limits Gait & Station: Slow walking Patient leans: N/A  Psychiatric Specialty Exam: Review of Systems  Psychiatric/Behavioral: Positive for hallucinations and suicidal ideas. The patient is nervous/anxious and has insomnia.     There were no vitals taken for this visit.There is no height or weight on file to calculate BMI.  General Appearance: Disheveled and Guarded  Eye Contact:  Minimal  Speech:  Slow  Volume:  Decreased  Mood:  Depressed and Hopeless  Affect:  Constricted and Flat  Thought Process:  Descriptions of Associations: Loose  Orientation:  Full (Time, Place, and Person)  Thought Content: Hallucinations: Auditory Voices telling to kill herself, Paranoid Ideation and Rumination   Suicidal Thoughts:  Yes.  with intent/plan to take overdose  Homicidal Thoughts:  No  Memory:  Immediate;   Fair Recent;   Fair Remote;   Fair  Judgement:  Fair  Insight:  Fair  Psychomotor Activity:  Decreased and Psychomotor Retardation  Concentration:  Concentration: Poor and Attention Span: Fair  Recall:  [employer] of Knowledge: Fair  Language: Fair  Akathisia:  No  Handed:  Right  AIMS (if indicated): not done  Assets:  Desire for Improvement  ADL's:  Impaired  Cognition: Impaired,  Mild  Sleep:  Fair    Screenings: AUDIT     Admission (Discharged) from 06/07/2015 in [facility] ED to [facility]  Alcohol Use Disorder Identification Test Final Score (AUDIT)  0  0       Assessment and Plan: Annaleia is a 47 year old Caucasian female with significant history of psychosis and PTSD.  Patient recently admitted to the hospital after taking overdose on her medication.  Patient continued to exhibit symptoms of psychosis and suicidal ideation with plan to take overdose.  I spoke to patient and her husband Trey Paula about voluntary inpatient treatment.  Patient agreed with the plan.  Recommended to go to behavioral health center as a direct admission.  Consider starting clozapine since patient taking a lot of medication with limited response.  Patient should be considered for  ACT services for high level of care.  Time spent 25 minutes.  More than 50% of the time spent in psychoeducation, counseling and coordination of care.     Cleotis Nipper, MD 02/27/2018, 10:31 AM

## 2018-02-27 NOTE — ED Notes (Signed)
Pt is very difficult stick. Unable to obtain labs.

## 2018-02-27 NOTE — Tx Team (Signed)
Initial Treatment Plan 02/27/2018 11:29 PM Danielle [name] UJW:[id number]    PATIENT STRESSORS: Financial difficulties Medication change or noncompliance Substance abuse   PATIENT STRENGTHS: Capable of independent living Motivation for treatment/growth Supportive family/friends   PATIENT IDENTIFIED PROBLEMS:   Impaired speach    Suicide Ideations and thoughts    Paranoia    Hearing voices       DISCHARGE CRITERIA:  Adequate post-discharge living arrangements Improved stabilization in mood, thinking, and/or behavior Medical problems require only outpatient monitoring Motivation to continue treatment in a less acute level of care  PRELIMINARY DISCHARGE PLAN: Attend 12-step recovery group Participate in family therapy Return to previous living arrangement  PATIENT/FAMILY INVOLVEMENT: This treatment plan has been presented to and reviewed with the patient, Danielle [name],   The patient  have been given the opportunity to ask questions and make suggestions.  Lelan Pons, RN 02/27/2018, 11:29 PM

## 2018-02-27 NOTE — BH Assessment (Addendum)
Assessment Note  Danielle [name] is an 47 y.o. female presents to [facility] with husband voluntarily. Pt presents with c/o hearing voices. . Voices are telling her "you are worthless, don't deserve to live". Pt was inpatient two weeks ago at [facility] for attempted overdose with 200 pills muscle relaxer's and Trazadone. Pt reports "I could hurt myself if the voices don't stop". Pt denies homicidal thoughts or physical aggression. Pt denies having access to firearms. Pt denies having any legal problems at this time. 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 Pt reports she also has hallucinations but it has "not been as bad".   Pt is dressed in street clothes, alert, oriented x4 with slurred speech and normal motor behavior. Eye contact is poor and Pt is holding her ears and moving back and forth. . Pt's mood is anxious and affect is congruent. Thought process is coherent and relevant. Pt's insight is poor and judgement is impaired .  Diagnosis: F20.9 Schizophrenia  Past Medical History:  Past Medical History:  Diagnosis Date  . Anemia   . Anxiety   . Bipolar 1 disorder (HCC)   . Depressed   . Headache(784.0)   . Heart murmur   . Hypothyroidism   . PTSD (post-traumatic stress disorder)   . Schizophrenia (HCC)   . Seizures (HCC)   . Shortness of breath     Past Surgical History:  Procedure Laterality Date  . FOOT SURGERY    . LEG SURGERY    . NASAL FRACTURE SURGERY    . TUBAL LIGATION  1996    Family History:  Family History  Adopted: Yes  Problem Relation Age of Onset  . Drug abuse Daughter        heroin addiction    Social History:  reports that she quit smoking about 6 years ago. Her smoking use included cigarettes. She has a 45.00 pack-year smoking history. She uses smokeless tobacco. She reports that she does not drink alcohol or use drugs.  Additional Social History:  Alcohol / Drug Use Pain Medications: See MAR Prescriptions: See MAR Over the Counter: See  MAR History of alcohol / drug use?: Yes Substance #1 Name of Substance 1: Cocaine  1 - Age of First Use: UKN 1 - Last Use / Amount: Clean since 1994  CIWA: CIWA-Ar BP: 111/90 Pulse Rate: 81 COWS:    Allergies:  Allergies  Allergen Reactions  . Tomato Rash    Pt reports that she breaks out in a rash if she eats tomato based foods but can eat a whole tomato  . Imitrex [Sumatriptan Base] Hives  . Mustard Seed Rash    Also allergic to [facility] Medications:  (Not in a hospital admission)  OB/GYN Status:  No LMP recorded. Patient is postmenopausal.  General Assessment Data Location of Assessment: [facility] Assessment Services TTS Assessment: In system Is this a Tele or Face-to-Face Assessment?: Face-to-Face Is this an Initial Assessment or a Re-assessment for this encounter?: Initial Assessment Patient Accompanied by:: Adult Language Other than English: Yes What gender do you identify as?: Female Marital status: Married Pregnancy Status: No Living Arrangements: Spouse/significant other Can pt return to current living arrangement?: Yes Admission Status: Voluntary Is patient capable of signing voluntary admission?: Yes Referral Source: Self/Family/Friend Insurance type: BCBS  Medical Screening Exam [facility] Walk-in ONLY) Medical Exam completed: Yes  Crisis Care Plan Living Arrangements: Spouse/significant other Name of Psychiatrist: Dr. Lolly Mustache Name of Therapist: Victorino Dike  Education Status Is patient  currently in school?: No Is the patient employed, unemployed or receiving disability?: Receiving disability income  Risk to self with the past 6 months Suicidal Ideation: Yes-Currently Present Has patient been a risk to self within the past 6 months prior to admission? : Yes Suicidal Intent: Yes-Currently Present Has patient had any suicidal intent within the past 6 months prior to admission? : Yes Is patient at risk for suicide?: Yes Suicidal Plan?: No Has patient had any  suicidal plan within the past 6 months prior to admission? : Yes Specify Current Suicidal Plan: Pt overdosed  Access to Means: No(Husband locked up medications) What has been your use of drugs/alcohol within the last 12 months?: Clean from cocaine since 1994 Previous Attempts/Gestures: Yes How many times?: (Multiple) Triggers for Past Attempts: Unknown Intentional Self Injurious Behavior: None Family Suicide History: Unknown(Adopted) Recent stressful life event(s): Conflict (Comment), Loss (Comment), Trauma (Comment) Persecutory voices/beliefs?: Yes Depression: Yes Depression Symptoms: Despondent, Insomnia, Loss of interest in usual pleasures, Feeling worthless/self pity Substance abuse history and/or treatment for substance abuse?: No Suicide prevention information given to non-admitted patients: Not applicable  Risk to Others within the past 6 months Homicidal Ideation: No Does patient have any lifetime risk of violence toward others beyond the six months prior to admission? : No Thoughts of Harm to Others: No Current Homicidal Intent: No Current Homicidal Plan: No Access to Homicidal Means: No History of harm to others?: No Assessment of Violence: None Noted Does patient have access to weapons?: No Criminal Charges Pending?: No Does patient have a court date: No Is patient on probation?: No  Psychosis Hallucinations: None noted Delusions: None noted  Mental Status Report Appearance/Hygiene: Disheveled Eye Contact: Poor Motor Activity: Freedom of movement Speech: Slurred Level of Consciousness: Quiet/awake Mood: Anxious Affect: Depressed Anxiety Level: Severe Thought Processes: Circumstantial Judgement: Impaired Orientation: Person, Place, Time, Situation, Appropriate for developmental age Obsessive Compulsive Thoughts/Behaviors: None  Cognitive Functioning Concentration: Fair Memory: Recent Intact Is patient IDD: No Impulse Control: Poor Appetite: Good Have  you had any weight changes? : No Change Sleep: Decreased Total Hours of Sleep: 2 Vegetative Symptoms: None  ADLScreening [facility] Assessment Services) Patient's cognitive ability adequate to safely complete daily activities?: Yes Patient able to express need for assistance with ADLs?: Yes Independently performs ADLs?: Yes (appropriate for developmental age)  Prior Inpatient Therapy Prior Inpatient Therapy: Yes Prior Therapy Dates: 10/19 Turner Daniels [place](Multiple [facility]) Prior Therapy Facilty/Provider(s): Lorelei Pont [facility] Reason for Treatment: SI   Prior Outpatient Therapy Prior Outpatient Therapy: Yes Prior Therapy Dates: Ongoing  Prior Therapy Facilty/Provider(s): Dr Lolly Mustache  Reason for Treatment: SI Depression Does patient have an ACCT team?: No Does patient have Intensive In-House Services?  : No Does patient have [facility] services? : No Does patient have P4CC services?: No  ADL Screening (condition at time of admission) Patient's cognitive ability adequate to safely complete daily activities?: Yes Is the patient deaf or have difficulty hearing?: No Does the patient have difficulty seeing, even when wearing glasses/contacts?: No Does the patient have difficulty concentrating, remembering, or making decisions?: Yes Patient able to express need for assistance with ADLs?: Yes Does the patient have difficulty dressing or bathing?: No Independently performs ADLs?: Yes (appropriate for developmental age) Does the patient have difficulty walking or climbing stairs?: Yes Weakness of Legs: None Weakness of Arms/Hands: None     Therapy Consults (therapy consults require a physician order) PT Evaluation Needed: No OT Evalulation Needed: No SLP Evaluation Needed: No Abuse/Neglect Assessment (  Assessment to be complete while patient is alone) Abuse/Neglect Assessment Can Be Completed: Yes Physical Abuse: Denies Verbal Abuse: Denies Sexual Abuse: Denies Exploitation  of patient/patient's resources: Denies Self-Neglect: Denies Values / Beliefs Cultural Requests During Hospitalization: None Spiritual Requests During Hospitalization: None Consults Spiritual Care Consult Needed: No Social Work Consult Needed: No Merchant navy officer (For Healthcare) Does Patient Have a Medical Advance Directive?: No Would patient like information on creating a medical advance directive?: No - Patient declined          Disposition:  Disposition Initial Assessment Completed for this Encounter: Yes Disposition of Patient: Admit Type of inpatient treatment program: Adult   Per Reola Calkins, NP pt meets inpatient criteria. Pt sent to [facility] for medical clearance.   On Site Evaluation by:   Reviewed with Physician:    Danae Orleans, MA, [facility] 02/27/2018 2:08 PM

## 2018-02-28 DIAGNOSIS — R45851 Suicidal ideations: Secondary | ICD-10-CM

## 2018-02-28 DIAGNOSIS — F200 Paranoid schizophrenia: Principal | ICD-10-CM

## 2018-02-28 LAB — CBC WITH DIFFERENTIAL/PLATELET
ABS IMMATURE GRANULOCYTES: 0.15 10*3/uL — AB (ref 0.00–0.07)
BASOS ABS: 0.1 10*3/uL (ref 0.0–0.1)
Basophils Relative: 1 %
Eosinophils Absolute: 0.2 10*3/uL (ref 0.0–0.5)
Eosinophils Relative: 2 %
HCT: 39.6 % (ref 36.0–46.0)
HEMOGLOBIN: 12.8 g/dL (ref 12.0–15.0)
Immature Granulocytes: 1 %
LYMPHS ABS: 2 10*3/uL (ref 0.7–4.0)
LYMPHS PCT: 16 %
MCH: 32 pg (ref 26.0–34.0)
MCHC: 32.3 g/dL (ref 30.0–36.0)
MCV: 99 fL (ref 80.0–100.0)
MONO ABS: 0.7 10*3/uL (ref 0.1–1.0)
Monocytes Relative: 6 %
NEUTROS ABS: 9.6 10*3/uL — AB (ref 1.7–7.7)
Neutrophils Relative %: 74 %
Platelets: 420 10*3/uL — ABNORMAL HIGH (ref 150–400)
RBC: 4 MIL/uL (ref 3.87–5.11)
RDW: 13.9 % (ref 11.5–15.5)
WBC: 12.7 10*3/uL — ABNORMAL HIGH (ref 4.0–10.5)
nRBC: 0 % (ref 0.0–0.2)

## 2018-02-28 LAB — LIPID PANEL
CHOL/HDL RATIO: 7.6 ratio
CHOLESTEROL: 242 mg/dL — AB (ref 0–200)
HDL: 32 mg/dL — ABNORMAL LOW (ref >40)
LDL Cholesterol: UNDETERMINED mg/dL (ref 0–99)
TRIGLYCERIDES: 728 mg/dL — AB (ref <150)
VLDL: UNDETERMINED mg/dL (ref 0–40)

## 2018-02-28 LAB — HEMOGLOBIN A1C
Hgb A1c MFr Bld: 5.8 % — ABNORMAL HIGH (ref 4.8–5.6)
MEAN PLASMA GLUCOSE: 119.76 mg/dL

## 2018-02-28 LAB — TSH: TSH: 3.661 u[IU]/mL (ref 0.350–4.500)

## 2018-02-28 MED ORDER — ALBUTEROL SULFATE (2.5 MG/3ML) 0.083% IN NEBU
2.5000 mg | INHALATION_SOLUTION | Freq: Four times a day (QID) | RESPIRATORY_TRACT | Status: DC | PRN
  Administered 2018-02-28 – 2018-03-04 (×5): 2.5 mg via RESPIRATORY_TRACT
  Filled 2018-02-28 (×5): qty 3

## 2018-02-28 MED ORDER — PAROXETINE HCL ER 12.5 MG PO TB24
37.5000 mg | ORAL_TABLET | Freq: Every day | ORAL | Status: DC
  Administered 2018-02-28 – 2018-03-02 (×3): 37.5 mg via ORAL
  Filled 2018-02-28 (×4): qty 3

## 2018-02-28 MED ORDER — CLOZAPINE 25 MG PO TABS
25.0000 mg | ORAL_TABLET | Freq: Every day | ORAL | Status: DC
  Administered 2018-02-28: 25 mg via ORAL
  Filled 2018-02-28: qty 1

## 2018-02-28 MED ORDER — BUTALBITAL-APAP-CAFFEINE 50-325-40 MG PO TABS
2.0000 | ORAL_TABLET | Freq: Four times a day (QID) | ORAL | Status: DC | PRN
  Administered 2018-03-02 – 2018-03-03 (×3): 2 via ORAL
  Filled 2018-02-28 (×5): qty 2

## 2018-02-28 MED ORDER — LOXAPINE SUCCINATE 10 MG PO CAPS
50.0000 mg | ORAL_CAPSULE | Freq: Two times a day (BID) | ORAL | Status: DC
  Administered 2018-02-28 – 2018-03-01 (×2): 50 mg via ORAL
  Filled 2018-02-28 (×3): qty 5

## 2018-02-28 NOTE — Plan of Care (Signed)
Active in the milieu but continues to experience anxiety and hallucinations.

## 2018-02-28 NOTE — Progress Notes (Signed)
Clozapine monitoring Consult   47 yo female ordered clozapine.  02/28/2018  ANC [id number]  Information entered into Clozapine registry and pt eligible to receive clozapine Next labs due in a week - ordered for 10/  Pharmacy will continue to follow.   [facility], RPh 02/28/2018 2:55 PM

## 2018-02-28 NOTE — Progress Notes (Signed)
Received Danielle [name] this AM after breakfast, she was compliant with her medications. She endorsed feeling depressed and anxious with voices telling her she is stupid, dump and worthless and does not derserve to live. She is OOB at intervals throughout the day and for meals.Marland Kitchen

## 2018-02-28 NOTE — BHH Group Notes (Signed)
LCSW Group Therapy Note   02/28/2018 1:15pm   Type of Therapy and Topic:  Group Therapy:  Trust and Honesty  Participation Level:  Did Not Attend  Description of Group:    In this group patients will be asked to explore the value of being honest.  Patients will be guided to discuss their thoughts, feelings, and behaviors related to honesty and trusting in others. Patients will process together how trust and honesty relate to forming relationships with peers, family members, and self. Each patient will be challenged to identify and express feelings of being vulnerable. Patients will discuss reasons why people are dishonest and identify alternative outcomes if one was truthful (to self or others). This group will be process-oriented, with patients participating in exploration of their own experiences, giving and receiving support, and processing challenge from other group members.   Therapeutic Goals: 1. Patient will identify why honesty is important to relationships and how honesty overall affects relationships.  2. Patient will identify a situation where they lied or were lied too and the  feelings, thought process, and behaviors surrounding the situation 3. Patient will identify the meaning of being vulnerable, how that feels, and how that correlates to being honest with self and others. 4. Patient will identify situations where they could have told the truth, but instead lied and explain reasons of dishonesty.   Summary of Patient Progress: Pt was invited to attend group but chose not to attend. CSW will continue to encourage pt to attend group throughout their admission.     Therapeutic Modalities:   Cognitive Behavioral Therapy Solution Focused Therapy Motivational Interviewing Brief Therapy  Jaelen Gellerman  CUEBAS-COLON, LCSW 02/28/2018 11:43 AM

## 2018-02-28 NOTE — Plan of Care (Signed)
New admit in bed with C-PAP voluntary committed no distress at this, frequent and 15 minute rounding for safety is maintained.

## 2018-02-28 NOTE — [facility] Suicide Risk Assessment (Signed)
[facility] Admission Suicide Risk Assessment   Nursing information obtained from:  Patient Demographic factors:  Low socioeconomic status Current Mental Status:  Self-harm thoughts Loss Factors:  Decline in physical health Historical Factors:  NA Risk Reduction Factors:  Positive coping skills or problem solving skills  Total Time spent with patient: 1 hour Principal Problem: Paranoid schizophrenia (HCC) Diagnosis:   Patient Active Problem List   Diagnosis Date Noted  . Suicidal ideation [R45.851] 02/28/2018  . 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 Frequent falls [R29.6] 06/18/2015  . Tobacco use disorder [F17.200] 06/08/2015  . Hyperammonemia (HCC) [E72.20] 06/08/2015  . Paranoid schizophrenia (HCC) [F20.0]   . Migraine headache [G43.909] 06/04/2012  . Seizure (HCC) [R56.9] 06/04/2012  . Hypothyroidism [E03.9] 06/04/2012  . OSA (obstructive sleep apnea) [G47.33] 08/29/2011  . Post traumatic stress disorder (PTSD) [F43.10] 07/31/2011   Subjective Data: Patient seen chart reviewed.  This is a patient with chronic mental health problems and a diagnosis of schizophrenia who was transferred to [place] from the emergency room in [place] because of suicidal ideation and auditory hallucinations.  Patient says voices are still very bad.  Still has suicidal thoughts only without any intent to act on it here in the hospital.  Patient has chronic headaches and is feeling short of breath.  Tired out.  No homicidal ideation.  Continued Clinical Symptoms:  Alcohol Use Disorder Identification Test Final Score (AUDIT): 9 The "Alcohol Use Disorders Identification Test", Guidelines for Use in Primary Care, Second Edition.  World Science writer [facility]). Score between 0-7:  no or low risk or alcohol related problems. Score between 8-15:  moderate risk of alcohol related problems. Score between 16-19:  high risk of alcohol related problems. Score 20 or  above:  warrants further diagnostic evaluation for alcohol dependence and treatment.   CLINICAL FACTORS:   Schizophrenia:   Command hallucinatons Depressive state   Musculoskeletal: Strength & Muscle Tone: within normal limits Gait & Station: normal Patient leans: N/A  Psychiatric Specialty Exam: Physical Exam  Nursing note and vitals reviewed. Constitutional: She appears well-developed and well-nourished.  HENT:  Head: Normocephalic and atraumatic.  Eyes: Pupils are equal, round, and reactive to light. Conjunctivae are normal.  Neck: Normal range of motion.  Cardiovascular: Regular rhythm and normal heart sounds.  Respiratory: Effort normal. She has wheezes.  GI: Soft.  Musculoskeletal: Normal range of motion.  Neurological: She is alert.  Skin: Skin is warm and dry.  Psychiatric: Judgment normal. Her speech is delayed. She is slowed. Cognition and memory are normal. She exhibits a depressed mood. She expresses suicidal ideation. She expresses suicidal plans.    Review of Systems  Constitutional: Negative.   HENT: Negative.   Eyes: Negative.   Respiratory: Positive for wheezing.   Cardiovascular: Negative.   Gastrointestinal: Negative.   Musculoskeletal: Negative.   Skin: Negative.   Neurological: Negative.   Psychiatric/Behavioral: Positive for depression, hallucinations and suicidal ideas. Negative for memory loss and substance abuse. The patient is nervous/anxious. The patient does not have insomnia.     Blood pressure (!) 143/95, pulse 96, temperature 98.9 F (37.2 C), temperature source Oral, resp. rate 18, height 5\' 9"  (1.753 m), weight 120.2 kg, SpO2 94 %.Body mass index is 39.13 kg/m.  General Appearance: Casual  Eye Contact:  Good  Speech:  Slow  Volume:  Decreased  Mood:  Depressed  Affect:  Constricted  Thought Process:  Coherent  Orientation:  Full (Time, Place, and Person)  Thought Content:  Logical and Hallucinations: Auditory Command:  Patient says  she hears voices telling her to kill herself and also making insulting comments  Suicidal Thoughts:  Yes.  with intent/plan  Homicidal Thoughts:  No  Memory:  Immediate;   Fair Recent;   Fair Remote;   Fair  Judgement:  Fair  Insight:  Fair  Psychomotor Activity:  Decreased  Concentration:  Concentration: Fair  Recall:  [employer] of Knowledge:  Fair  Language:  Fair  Akathisia:  No  Handed:  Right  AIMS (if indicated):     Assets:  Desire for Improvement Housing Resilience Social Support  ADL's:  Intact  Cognition:  WNL  Sleep:  Number of Hours: 6      COGNITIVE FEATURES THAT CONTRIBUTE TO RISK:  Thought constriction (tunnel vision)    SUICIDE RISK:   Moderate:  Frequent suicidal ideation with limited intensity, and duration, some specificity in terms of plans, no associated intent, good self-control, limited dysphoria/symptomatology, some risk factors present, and identifiable protective factors, including available and accessible social support.  PLAN OF CARE: Patient is on 15-minute checks and will be monitored for suicidal ideation daily.  She will be included in groups and receive a full treatment team assessment.  Medication plans are in place to begin more aggressive treatment of mood and psychosis.  I certify that inpatient services furnished can reasonably be expected to improve the patient's condition.   Mordecai Rasmussen, MD 02/28/2018, 11:51 AM

## 2018-02-28 NOTE — Progress Notes (Signed)
Patient was visible in the milieu, then came to the nurses station complaining of voices " they won't live me alone". Patient was anxious and restless. Received Clonopin 0.5 mg by mouth. Currently in bed resting. Staff continue to monitor.

## 2018-02-28 NOTE — H&P (Signed)
Psychiatric Admission Assessment Adult  Patient Identification: Danielle [name] MRN:  [id number] Date of Evaluation:  02/28/2018 Chief Complaint:  Schizophrenia Principal Diagnosis: Paranoid schizophrenia ([place]) Diagnosis:   Patient Active Problem List   Diagnosis Date Noted  . Suicidal ideation [R45.851] 02/28/2018  . Severe major depression, single episode, with psychotic features ([place]) [F32.3] 02/27/2018  . Frequent falls [R29.6] 06/18/2015  . Tobacco use disorder [F17.200] 06/08/2015  . Hyperammonemia ([place]) [E72.20] 06/08/2015  . Paranoid schizophrenia ([place]) [F20.0]   . Migraine headache [G43.909] 06/04/2012  . Seizure ([place]) [R56.9] 06/04/2012  . Hypothyroidism [E03.9] 06/04/2012  . OSA (obstructive sleep apnea) [G47.33] 08/29/2011  . Post traumatic stress disorder (PTSD) [F43.10] 07/31/2011   History of Present Illness: Patient transferred to [place] from the hospital in [place].  Her usual psychiatrist, Dr. Adele Schilder, I had recommended that she come to the hospital because of worsening symptoms of psychosis and depression along with suicidal ideation.  Patient reports she has been struggling with auditory hallucinations for years but that for the last couple weeks things have been getting even worse.  Hallucinations are loud and intrusive most of the day.  They say negative things to her including making ugly insults and telling her to kill her self.  Patient has been having suicidal thoughts with thoughts of overdosing but has resisted it.  Mood is very depressed and anxious and somewhat hopeless.  Denies that she has been abusing substances.  Says that she has been fully compliant with prescribed medicines.  Cites multiple recent stresses including anniversaries of her father's death and also the fact that her grandchildren are in [place] custody and that her daughter is in jail.  Social history: Patient is married lives with her husband has several adult children.  Not able to work outside the  home.  Says that her relationship with her husband is very good.  Medical history: Overweight history of hypothyroid history of migraine headaches history of sleep apnea.  Patient was wheezy when she woke up today and has a history of shortness of breath and albuterol is mentioned in her previous medicines but the patient tells me she takes albuterol nebulizers regularly.  Substance abuse history: Denies alcohol or drug abuse Associated Signs/Symptoms: Depression Symptoms:  depressed mood, anhedonia, psychomotor retardation, feelings of worthlessness/guilt, difficulty concentrating, hopelessness, suicidal thoughts with specific plan, suicidal attempt, loss of energy/fatigue, (Hypo) Manic Symptoms:  Hallucinations, Anxiety Symptoms:  Excessive Worry, Psychotic Symptoms:  Hallucinations: Auditory Command:  Patient has command hallucinations to kill her self PTSD Symptoms: Had a traumatic exposure:  History of trauma as a younger person with chronic anxiety Total Time spent with patient: 1 hour  Past Psychiatric History: Patient has a history of a diagnosis of both PTSD and schizophrenia.  Has been on multiple medications including lithium multiple antipsychotics multiple antidepressants.  She says that her hallucinations have never gotten under complete control and recently have been worse.  She has had several prior hospitalizations and recently had a suicide attempt by overdose.  She has never been on clozapine before.  Is the patient at risk to self? Yes.    Has the patient been a risk to self in the past 6 months? Yes.    Has the patient been a risk to self within the distant past? Yes.    Is the patient a risk to others? No.  Has the patient been a risk to others in the past 6 months? No.  Has the patient been a risk to others within  the distant past? No.   Prior Inpatient Therapy:   Prior Outpatient Therapy:    Alcohol Screening: 1. How often do you have a drink containing  alcohol?: 2 to 4 times a month 2. How many drinks containing alcohol do you have on a typical day when you are drinking?: 3 or 4 3. How often do you have six or more drinks on one occasion?: Less than monthly AUDIT-C Score: 4 4. How often during the last year have you found that you were not able to stop drinking once you had started?: Less than monthly 5. How often during the last year have you failed to do what was normally expected from you becasue of drinking?: Less than monthly 6. How often during the last year have you needed a first drink in the morning to get yourself going after a heavy drinking session?: Less than monthly 7. How often during the last year have you had a feeling of guilt of remorse after drinking?: Less than monthly 8. How often during the last year have you been unable to remember what happened the night before because you had been drinking?: Less than monthly 9. Have you or someone else been injured as a result of your drinking?: No 10. Has a relative or friend or a doctor or another health worker been concerned about your drinking or suggested you cut down?: No Alcohol Use Disorder Identification Test Final Score (AUDIT): 9 Intervention/Follow-up: Alcohol Education Substance Abuse History in the last 12 months:  No. Consequences of Substance Abuse: Negative Previous Psychotropic Medications: Yes  Psychological Evaluations: Yes  Past Medical History:  Past Medical History:  Diagnosis Date  . Anemia   . Anxiety   . Bipolar 1 disorder ([place])   . Depressed   . Headache(784.0)   . Heart murmur   . Hypothyroidism   . PTSD (post-traumatic stress disorder)   . Schizophrenia ([place])   . Seizures ([place])   . Shortness of breath     Past Surgical History:  Procedure Laterality Date  . FOOT SURGERY    . LEG SURGERY    . NASAL FRACTURE SURGERY    . TUBAL LIGATION  1996   Family History:  Family History  Adopted: Yes  Problem Relation Age of Onset  . Drug abuse  Daughter        heroin addiction   Family Psychiatric  History: History of substance abuse Tobacco Screening: Have you used any form of tobacco in the last 30 days? (Cigarettes, Smokeless Tobacco, Cigars, and/or Pipes): Yes Tobacco use, Select all that apply: 5 or more cigarettes per day Are you interested in Tobacco Cessation Medications?: Yes, will notify MD for an order Counseled patient on smoking cessation including recognizing danger situations, developing coping skills and basic information about quitting provided: Yes Social History:  Social History   Substance and Sexual Activity  Alcohol Use No  . Alcohol/week: 0.0 standard drinks     Social History   Substance and Sexual Activity  Drug Use No    Additional Social History:                           Allergies:   Allergies  Allergen Reactions  . Tomato Rash    Pt reports that she breaks out in a rash if she eats tomato based foods but can eat a whole tomato  . Imitrex [Sumatriptan Base] Hives  . Mustard Seed Rash    Also  allergic to [facility] Other reaction(s): Other (See Comments) UTA   Lab Results:  Results for orders placed or performed during the hospital encounter of 02/27/18 (from the past 48 hour(s))  Rapid urine drug screen (hospital performed)     Status: Abnormal   Collection Time: 02/27/18  3:04 PM  Result Value Ref Range   Opiates NONE DETECTED NONE DETECTED   Cocaine NONE DETECTED NONE DETECTED   Benzodiazepines NONE DETECTED NONE DETECTED   Amphetamines NONE DETECTED NONE DETECTED   Tetrahydrocannabinol NONE DETECTED NONE DETECTED   Barbiturates POSITIVE (A) NONE DETECTED    Comment: (NOTE) DRUG SCREEN FOR MEDICAL PURPOSES ONLY.  IF CONFIRMATION IS NEEDED FOR ANY PURPOSE, NOTIFY LAB WITHIN 5 DAYS. LOWEST DETECTABLE LIMITS FOR URINE DRUG SCREEN Drug Class                     Cutoff (ng/mL) Amphetamine and metabolites    1000 Barbiturate and metabolites    200 Benzodiazepine                  536 Tricyclics and metabolites     300 Opiates and metabolites        300 Cocaine and metabolites        300 THC                            50 Performed at [facility], [place] [address] [place], [place] [address]   Comprehensive metabolic panel     Status: Abnormal   Collection Time: 02/27/18  5:10 PM  Result Value Ref Range   Sodium 136 135 - 145 mmol/L   Potassium 4.2 3.5 - 5.1 mmol/L    Comment: MODERATE HEMOLYSIS   Chloride 101 98 - 111 mmol/L   CO2 27 22 - 32 mmol/L   Glucose, Bld 96 70 - 99 mg/dL   BUN 10 6 - 20 mg/dL   Creatinine, Ser 1.11 (H) 0.44 - 1.00 mg/dL   Calcium 9.6 8.9 - 10.3 mg/dL   Total Protein 6.8 6.5 - 8.1 g/dL   Albumin 4.1 3.5 - 5.0 g/dL   AST 24 15 - 41 U/L   ALT 13 0 - 44 U/L   Alkaline Phosphatase 81 38 - 126 U/L   Total Bilirubin 1.5 (H) 0.3 - 1.2 mg/dL   GFR calc non Af Amer 57 (L) >60 mL/min   GFR calc Af Amer >60 >60 mL/min    Comment: (NOTE) The eGFR has been calculated using the CKD EPI equation. This calculation has not been validated in all clinical situations. eGFR's persistently <60 mL/min signify possible Chronic Kidney Disease.    Anion gap 8 5 - 15    Comment: Performed at [facility], [place] [address], [place], [place] [address]  Ethanol     Status: None   Collection Time: 02/27/18  5:10 PM  Result Value Ref Range   Alcohol, Ethyl (B) <10 <10 mg/dL    Comment: (NOTE) Lowest detectable limit for serum alcohol is 10 mg/dL. For medical purposes only. Performed at [facility], [place] [address], [place], [place] [address]   cbc     Status: Abnormal   Collection Time: 02/27/18  5:10 PM  Result Value Ref Range   WBC 14.0 (H) 4.0 - 10.5 K/uL   RBC 4.06 3.87 - 5.11 MIL/uL   Hemoglobin 12.8 12.0 - 15.0 g/dL   HCT 41.2 36.0 -  46.0 %   MCV 101.5 (H) 80.0 - 100.0 fL   MCH 31.5 26.0 - 34.0 pg   MCHC 31.1 30.0 - 36.0 g/dL   RDW 14.2 11.5 - 15.5 %   Platelets 393 150 - 400 K/uL    nRBC 0.0 0.0 - 0.2 %    Comment: Performed at [facility], [place] [address], [place], [place] [address]  I-Stat beta hCG blood, ED     Status: None   Collection Time: 02/27/18  5:17 PM  Result Value Ref Range   I-stat hCG, quantitative <5.0 <5 mIU/mL   Comment 3            Comment:   GEST. AGE      CONC.  (mIU/mL)   <=1 WEEK        5 - 50     2 WEEKS       50 - 500     3 WEEKS       100 - 10,000     4 WEEKS     1,000 - 30,000        FEMALE AND NON-PREGNANT FEMALE:     LESS THAN 5 mIU/mL     Blood Alcohol level:  Lab Results  Component Value Date   ETH <10 02/27/2018   ETH <5 25/63/8937    Metabolic Disorder Labs:  Lab Results  Component Value Date   HGBA1C 5.9 (H) 06/26/2017   MPG 123 (H) 02/05/2012   MPG 114 09/22/2011   Lab Results  Component Value Date   PROLACTIN 39.1 (H) 06/08/2015   Lab Results  Component Value Date   CHOL 218 (H) 06/08/2015   TRIG 215 (H) 06/08/2015   HDL 36 (L) 06/08/2015   CHOLHDL 6.1 06/08/2015   VLDL 43 (H) 06/08/2015   LDLCALC 139 (H) 06/08/2015   LDLCALC UNABLE TO CALCULATE IF TRIGLYCERIDE OVER 400 mg/dL 09/22/2011    Current Medications: Current Facility-Administered Medications  Medication Dose Route Frequency Provider Last Rate Last Dose  . acetaminophen (TYLENOL) tablet 650 mg  650 mg Oral Q6H PRN Lamarr Feenstra T, MD      . albuterol (PROVENTIL) (2.5 MG/3ML) 0.083% nebulizer solution 2.5 mg  2.5 mg Nebulization Q6H PRN Milca Sytsma T, MD      . alum & mag hydroxide-simeth (MAALOX/MYLANTA) 200-200-20 MG/5ML suspension 30 mL  30 mL Oral Q4H PRN Jakarius Flamenco T, MD      . aspirin EC tablet 81 mg  81 mg Oral Q breakfast Quatavious Rossa, Madie Reno, MD   81 mg at 02/28/18 0916  . butalbital-acetaminophen-caffeine (FIORICET, ESGIC) 50-325-40 MG per tablet 2 tablet  2 tablet Oral Q6H PRN Jamicah Anstead T, MD      . clonazePAM Bobbye [place]) tablet 0.5 mg  0.5 mg Oral QID PRN Ziare Orrick, Madie Reno, MD   0.5 mg at 02/28/18 0919  .  cloZAPine (CLOZARIL) tablet 25 mg  25 mg Oral QHS Hulbert Branscome T, MD      . diltiazem (CARDIZEM CD) 24 hr capsule 120 mg  120 mg Oral Daily Tonnie Friedel, Madie Reno, MD   120 mg at 02/28/18 0918  . folic acid (FOLVITE) tablet 1 mg  1 mg Oral Daily Pama Roskos T, MD   1 mg at 02/28/18 0917  . hydrochlorothiazide (HYDRODIURIL) tablet 12.5 mg  12.5 mg Oral Daily Yocelyn Brocious, Madie Reno, MD   12.5 mg at 02/28/18 0917  . ibuprofen (ADVIL,MOTRIN) tablet 800 mg  800 mg Oral Q8H PRN Trew Sunde,  Madie Reno, MD   800 mg at 02/28/18 0920  . levothyroxine (SYNTHROID, LEVOTHROID) tablet 100 mcg  100 mcg Oral Q0600 , Madie Reno, MD   100 mcg at 02/28/18 0654  . lithium carbonate (ESKALITH) CR tablet 450 mg  450 mg Oral Daily , Madie Reno, MD   450 mg at 02/28/18 0917  . loxapine (LOXITANE) capsule 50 mg  50 mg Oral BID ,  T, MD      . magnesium hydroxide (MILK OF MAGNESIA) suspension 30 mL  30 mL Oral Daily PRN ,  T, MD      . PARoxetine (PAXIL-CR) 24 hr tablet 37.5 mg  37.5 mg Oral Daily ,  T, MD      . traZODone (DESYREL) tablet 100 mg  100 mg Oral QHS PRN , Madie Reno, MD       PTA Medications: Medications Prior to Admission  Medication Sig Dispense Refill Last Dose  . acetaminophen (TYLENOL 8 HOUR) 650 MG CR tablet Take 1 tablet (650 mg total) by mouth every 8 (eight) hours as needed for pain. (Patient not taking: Reported on 02/27/2018) 30 tablet 0 Not Taking at Unknown time  . aspirin EC 81 MG tablet Take 1 tablet (81 mg total) by mouth daily with breakfast. (Patient not taking: Reported on 02/27/2018) 30 tablet 0 Not Taking at Unknown time  . clonazePAM (KLONOPIN) 0.5 MG tablet Take 1 tablet (0.5 mg total) by mouth 4 (four) times daily as needed for anxiety. 120 tablet 1 Taking  . diltiazem (DILACOR XR) 120 MG 24 hr capsule Take 120 mg by mouth daily.   Taking  . eletriptan (RELPAX) 40 MG tablet TAKE 1 TAB AT ONSET OF HEADACHES. MAY REPEAT IN 2 HOURS.MAX 2/24 HRS. FOR 30 DAYS  6  Taking  . folic acid (FOLVITE) 1 MG tablet Take 1 tablet (1 mg total) by mouth daily. 30 tablet 0 Taking  . hydrochlorothiazide (HYDRODIURIL) 12.5 MG tablet TAKE ONE TABLET (12.5 MG DOSE) BY MOUTH DAILY. TAKE ON TABLET BY MOUTH ONCE A DAY IN THE MORNING.  0 Taking  . ibuprofen (ADVIL,MOTRIN) 800 MG tablet Take 800 mg by mouth every 8 (eight) hours as needed (pain).   Taking  . levothyroxine (SYNTHROID, LEVOTHROID) 100 MCG tablet TAKE ONE TABLET (100 MCG DOSE) BY MOUTH DAILY.  5 Taking  . lithium carbonate (ESKALITH) 450 MG CR tablet Take 1 tablet (450 mg total) by mouth daily. 90 tablet 0 Taking  . loxapine (LOXITANE) 50 MG capsule Take 1 capsule (50 mg total) by mouth 2 (two) times daily. for acute psychosis 180 capsule 0   . PARoxetine (PAXIL-CR) 37.5 MG 24 hr tablet Take 1 tablet (37.5 mg total) by mouth every morning. 90 tablet 0 Taking  . POTASSIUM PO Take by mouth.   Taking  . prazosin (MINIPRESS) 2 MG capsule Take 1 capsule (2 mg total) by mouth at bedtime. 90 capsule 0 Taking  . TROKENDI XR 100 MG CP24 Take 1 tablet by mouth daily.  3 Taking  . TROKENDI XR 200 MG CP24 Take 1 tablet by mouth daily.  3 Taking  . Vitamin D, Ergocalciferol, (DRISDOL) 50000 units CAPS capsule Take 50,000 Units by mouth every 7 (seven) days. saturday   Taking    Musculoskeletal: Strength & Muscle Tone: within normal limits Gait & Station: normal Patient leans: N/A  Psychiatric Specialty Exam: Physical Exam  Nursing note and vitals reviewed. Constitutional: She appears well-developed and well-nourished.  HENT:  Head: Normocephalic and atraumatic.  Eyes: Pupils are equal, round, and reactive to light. Conjunctivae are normal.  Neck: Normal range of motion.  Cardiovascular: Regular rhythm and normal heart sounds.  Respiratory: Effort normal. No respiratory distress. She has wheezes.  GI: Soft.  Musculoskeletal: Normal range of motion.  Neurological: She is alert.  Skin: Skin is warm and dry.   Psychiatric: Judgment normal. Her mood appears anxious. Her affect is blunt. Her speech is delayed. She is slowed. Cognition and memory are normal. She exhibits a depressed mood. She expresses suicidal ideation. She expresses suicidal plans.    Review of Systems  Constitutional: Negative.   HENT: Negative.   Eyes: Negative.   Respiratory: Negative.   Cardiovascular: Negative.   Gastrointestinal: Negative.   Musculoskeletal: Negative.   Skin: Negative.   Neurological: Negative.   Psychiatric/Behavioral: Positive for depression, hallucinations and suicidal ideas. Negative for memory loss and substance abuse. The patient is nervous/anxious. The patient does not have insomnia.     Blood pressure (!) 143/95, pulse 96, temperature 98.9 F (37.2 C), temperature source Oral, resp. rate 18, height 5' 9" (1.753 m), weight 120.2 kg, SpO2 94 %.Body mass index is 39.13 kg/m.  General Appearance: Casual  Eye Contact:  Good  Speech:  Slow  Volume:  Decreased  Mood:  Depressed  Affect:  Congruent  Thought Process:  Goal Directed  Orientation:  Full (Time, Place, and Person)  Thought Content:  Logical and Hallucinations: Auditory Command:  Has hallucinations commanding her to kill her self  Suicidal Thoughts:  Yes.  with intent/plan  Homicidal Thoughts:  No  Memory:  Immediate;   Fair Recent;   Fair Remote;   Fair  Judgement:  Fair  Insight:  Good  Psychomotor Activity:  Decreased  Concentration:  Concentration: Fair  Recall:  [employer] of Knowledge:  Fair  Language:  Fair  Akathisia:  No  Handed:  Right  AIMS (if indicated):     Assets:  Communication Skills Desire for Improvement Housing Resilience Social Support  ADL's:  Intact  Cognition:  WNL  Sleep:  Number of Hours: 6    Treatment Plan Summary: Daily contact with patient to assess and evaluate symptoms and progress in treatment, Medication management and Plan Patient will remain on 15-minute checks.  She is not actively  attempting to kill her self and has good insight and is cooperative with treatment.  Current medications will be continued.  After listening to her breathe I have gone ahead and ordered nebulizers to be available at least as needed.  I do not see any other breathing treatments in her recent prescription so we will continually reassess this.  Patient tells me she takes Fioricet for headaches because she is allergic to triptan's.  Orders placed for Fioricet.  Patient appears to have been taking Paxil lithium and loxapine primarily before coming into the hospital.  I have continued all of these medicines but also started clozapine 25 mg at night.  Dr. Marguerite Olea most recent notes suggested that that was the plan and the patient was aware of this to.  Side effects and the needs for blood checks as well as the possibility of agranulocytosis all discussed with the patient who is agreeable.  I will check a lithium level in the next day or so since it is unclear if that has been continually taken.  The last lithium level was subtherapeutic.  Observation Level/Precautions:  15 minute checks  Laboratory:  CBC  Psychotherapy:    Medications:  Consultations:    Discharge Concerns:    Estimated LOS:  Other:     Physician Treatment Plan for Primary Diagnosis: Paranoid schizophrenia ([place]) Long Term Goal(s): Improvement in symptoms so as ready for discharge  Short Term Goals: Ability to verbalize feelings will improve and Ability to disclose and discuss suicidal ideas  Physician Treatment Plan for Secondary Diagnosis: Principal Problem:   Paranoid schizophrenia ([place]) Active Problems:   Post traumatic stress disorder (PTSD)   Hypothyroidism   Severe major depression, single episode, with psychotic features ([place] )   Suicidal ideation  Long Term Goal(s): Improvement in symptoms so as ready for discharge  Short Term Goals: Ability to maintain clinical measurements within normal limits will improve and  Compliance with prescribed medications will improve  I certify that inpatient services furnished can reasonably be expected to improve the patient's condition.    Alethia Berthold, MD 10/26/201911:54 AM

## 2018-03-01 LAB — LITHIUM LEVEL: Lithium Lvl: 0.62 mmol/L (ref 0.60–1.20)

## 2018-03-01 MED ORDER — TEMAZEPAM 15 MG PO CAPS
30.0000 mg | ORAL_CAPSULE | Freq: Every day | ORAL | Status: DC
  Administered 2018-03-01: 30 mg via ORAL
  Filled 2018-03-01: qty 2

## 2018-03-01 MED ORDER — LURASIDONE HCL 40 MG PO TABS
40.0000 mg | ORAL_TABLET | Freq: Two times a day (BID) | ORAL | Status: DC
  Administered 2018-03-01 – 2018-03-02 (×2): 40 mg via ORAL
  Filled 2018-03-01 (×2): qty 1

## 2018-03-01 MED ORDER — CLOZAPINE 25 MG PO TABS
50.0000 mg | ORAL_TABLET | Freq: Every day | ORAL | Status: DC
  Administered 2018-03-01: 50 mg via ORAL
  Filled 2018-03-01: qty 2

## 2018-03-01 NOTE — Progress Notes (Signed)
Received Danielle [name] this AM after breakfast, she was compliant with her medications. She continues to hear voices at intervals with intensities. At the present time the voices are low and allowing her to function with her peers. She endorsed feeling anxious and depressed although its better since her admission. She does not feel suicidal. She is OOB in the milieu with her peer.  At 0910 she was standing in front of the nurses station with her hands over her head stating the loud voices have returned. She was medicated with Klonopin.

## 2018-03-01 NOTE — [facility] Group Notes (Signed)
LCSW Group Therapy Note 03/01/2018 1:15pm  Type of Therapy and Topic: Group Therapy: Feelings Around Returning Home & Establishing a Supportive Framework and Supporting Oneself When Supports Not Available  Participation Level: Did Not Attend  Description of Group:  Patients first processed thoughts and feelings about upcoming discharge. These included fears of upcoming changes, lack of change, new living environments, judgements and expectations from others and overall stigma of mental health issues. The group then discussed the definition of a supportive framework, what that looks and feels like, and how do to discern it from an unhealthy non-supportive network. The group identified different types of supports as well as what to do when your family/friends are less than helpful or unavailable  Therapeutic Goals  1. Patient will identify one healthy supportive network that they can use at discharge. 2. Patient will identify one factor of a supportive framework and how to tell it from an unhealthy network. 3. Patient able to identify one coping skill to use when they do not have positive supports from others. 4. Patient will demonstrate ability to communicate their needs through discussion and/or role plays.  Summary of Patient Progress:  Pt was invited to attend group but chose not to attend. CSW will continue to encourage pt to attend group throughout their admission.   Therapeutic Modalities Cognitive Behavioral Therapy Motivational Interviewing   Akaylah Lalley  CUEBAS-COLON, LCSW 03/01/2018 10:40 AM

## 2018-03-01 NOTE — Progress Notes (Signed)
[facility] MD Progress Note  03/01/2018 2:08 PM Danielle [name]  MRN:  [id number] Subjective: Follow-up for this patient with schizophrenia who is admitted to the hospital because of worsening symptoms.  She started clozapine last night.  Patient did not sleep well last night and has complained today of feeling even worse.  She is saying that she is having auditory hallucinations pretty much constantly.  Cannot sleep because of them.  Patient was tearful during the conversation and has been complaining to the nurses today about her hallucinations as well. Principal Problem: Paranoid schizophrenia (HCC) Diagnosis:   Patient Active Problem List   Diagnosis Date Noted  . Suicidal ideation [R45.851] 02/28/2018  . 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 Frequent falls [R29.6] 06/18/2015  . Tobacco use disorder [F17.200] 06/08/2015  . Hyperammonemia (HCC) [E72.20] 06/08/2015  . Paranoid schizophrenia (HCC) [F20.0]   . Migraine headache [G43.909] 06/04/2012  . Seizure (HCC) [R56.9] 06/04/2012  . Hypothyroidism [E03.9] 06/04/2012  . OSA (obstructive sleep apnea) [G47.33] 08/29/2011  . Post traumatic stress disorder (PTSD) [F43.10] 07/31/2011   Total Time spent with patient: 30 minutes  Past Psychiatric History: Long-standing history of schizophrenia looking back at her old notes it does not look like there was ever a really clear.  Where she was symptom-free but things have been getting worse.  Has been on multiple medications.  Past Medical History:  Past Medical History:  Diagnosis Date  . Anemia   . Anxiety   . Bipolar 1 disorder (HCC)   . Depressed   . Headache(784.0)   . Heart murmur   . Hypothyroidism   . PTSD (post-traumatic stress disorder)   . Schizophrenia (HCC)   . Seizures (HCC)   . Shortness of breath     Past Surgical History:  Procedure Laterality Date  . FOOT SURGERY    . LEG SURGERY    . NASAL FRACTURE SURGERY    .  TUBAL LIGATION  1996   Family History:  Family History  Adopted: Yes  Problem Relation Age of Onset  . Drug abuse Daughter        heroin addiction   Family Psychiatric  History: See previous note Social History:  Social History   Substance and Sexual Activity  Alcohol Use No  . Alcohol/week: 0.0 standard drinks     Social History   Substance and Sexual Activity  Drug Use No    Social History   Socioeconomic History  . Marital status: Married    Spouse name: Not on file  . Number of children: 2  . Years of education: Not on file  . Highest education level: Not on file  Occupational History  . Occupation: unemployed    Associate Professor: NOT EMPLOYED  Social Needs  . Financial resource strain: Not on file  . Food insecurity:    Worry: Not on file    Inability: Not on file  . Transportation needs:    Medical: Not on file    Non-medical: Not on file  Tobacco Use  . Smoking status: Former Smoker    Packs/day: 1.50    Years: 30.00    Pack years: 45.00    Types: Cigarettes    Last attempt to quit: 09/20/2011    Years since quitting: 6.4  . Smokeless tobacco: Current User  Substance and Sexual Activity  . Alcohol use: No    Alcohol/week: 0.0 standard drinks  . Drug use: No  . Sexual activity: Yes  Partners: Male    Birth control/protection: Surgical  Lifestyle  . Physical activity:    Days per week: Not on file    Minutes per session: Not on file  . Stress: Not on file  Relationships  . Social connections:    Talks on phone: Not on file    Gets together: Not on file    Attends religious service: Not on file    Active member of club or organization: Not on file    Attends meetings of clubs or organizations: Not on file    Relationship status: Not on file  Other Topics Concern  . Not on file  Social History Narrative   Pt is adopted. Unsure of family history.   Additional Social History:                         Sleep: Poor  Appetite:   Fair  Current Medications: Current Facility-Administered Medications  Medication Dose Route Frequency Provider Last Rate Last Dose  . acetaminophen (TYLENOL) tablet 650 mg  650 mg Oral Q6H PRN Clapacs, John T, MD      . albuterol (PROVENTIL) (2.5 MG/3ML) 0.083% nebulizer solution 2.5 mg  2.5 mg Nebulization Q6H PRN Clapacs, John T, MD   2.5 mg at 02/28/18 1408  . alum & mag hydroxide-simeth (MAALOX/MYLANTA) 200-200-20 MG/5ML suspension 30 mL  30 mL Oral Q4H PRN Clapacs, John T, MD      . aspirin EC tablet 81 mg  81 mg Oral Q breakfast Clapacs, Jackquline Denmark, MD   81 mg at 03/01/18 0811  . butalbital-acetaminophen-caffeine (FIORICET, ESGIC) 50-325-40 MG per tablet 2 tablet  2 tablet Oral Q6H PRN Clapacs, John T, MD      . clonazePAM Scarlette Calico) tablet 0.5 mg  0.5 mg Oral QID PRN Clapacs, Jackquline Denmark, MD   0.5 mg at 03/01/18 0912  . cloZAPine (CLOZARIL) tablet 50 mg  50 mg Oral QHS Clapacs, John T, MD      . diltiazem (CARDIZEM CD) 24 hr capsule 120 mg  120 mg Oral Daily Clapacs, Jackquline Denmark, MD   120 mg at 03/01/18 0811  . folic acid (FOLVITE) tablet 1 mg  1 mg Oral Daily Clapacs, John T, MD   1 mg at 03/01/18 0811  . hydrochlorothiazide (HYDRODIURIL) tablet 12.5 mg  12.5 mg Oral Daily Clapacs, Jackquline Denmark, MD   12.5 mg at 03/01/18 0811  . ibuprofen (ADVIL,MOTRIN) tablet 800 mg  800 mg Oral Q8H PRN Clapacs, Jackquline Denmark, MD   800 mg at 02/28/18 0920  . levothyroxine (SYNTHROID, LEVOTHROID) tablet 100 mcg  100 mcg Oral Q0600 Clapacs, Jackquline Denmark, MD   100 mcg at 03/01/18 0650  . lithium carbonate (ESKALITH) CR tablet 450 mg  450 mg Oral Daily Clapacs, Jackquline Denmark, MD   450 mg at 03/01/18 3474  . lurasidone (LATUDA) tablet 40 mg  40 mg Oral BID WC Clapacs, John T, MD      . magnesium hydroxide (MILK OF MAGNESIA) suspension 30 mL  30 mL Oral Daily PRN Clapacs, John T, MD      . PARoxetine (PAXIL-CR) 24 hr tablet 37.5 mg  37.5 mg Oral Daily Clapacs, John T, MD   37.5 mg at 03/01/18 0811  . temazepam (RESTORIL) capsule 30 mg  30 mg Oral  QHS Clapacs, Jackquline Denmark, MD        Lab Results:  Results for orders placed or performed during the hospital encounter of 02/27/18 (from  the past 48 hour(s))  Hemoglobin A1c     Status: Abnormal   Collection Time: 02/28/18  1:48 PM  Result Value Ref Range   Hgb A1c MFr Bld 5.8 (H) 4.8 - 5.6 %    Comment: (NOTE) Pre diabetes:          5.7%-6.4% Diabetes:              >6.4% Glycemic control for   <7.0% adults with diabetes    Mean Plasma Glucose 119.76 mg/dL    Comment: Performed at [facility], [address], [place], [place] [address]  Lipid panel     Status: Abnormal   Collection Time: 02/28/18  1:48 PM  Result Value Ref Range   Cholesterol 242 (H) 0 - 200 mg/dL   Triglycerides 528 (H) <150 mg/dL   HDL 32 (L) >41 mg/dL   Total CHOL/HDL Ratio 7.6 RATIO   VLDL UNABLE TO CALCULATE IF TRIGLYCERIDE OVER 400 mg/dL 0 - 40 mg/dL   LDL Cholesterol UNABLE TO CALCULATE IF TRIGLYCERIDE OVER 400 mg/dL 0 - 99 mg/dL    Comment:        Total Cholesterol/HDL:CHD Risk Coronary Heart Disease Risk Table                     Men   Women  1/2 Average Risk   3.4   3.3  Average Risk       5.0   4.4  2 X Average Risk   9.6   7.1  3 X Average Risk  23.4   11.0        Use the calculated Patient Ratio above and the CHD Risk Table to determine the patient's CHD Risk.        ATP III CLASSIFICATION (LDL):  <100     mg/dL   Optimal  324-401  mg/dL   Near or Above                    Optimal  130-159  mg/dL   Borderline  027-253  mg/dL   High  >664     mg/dL   Very High Performed at [facility], [address] [place], [place] [address]   TSH     Status: None   Collection Time: 02/28/18  1:48 PM  Result Value Ref Range   TSH 3.661 0.350 - 4.500 uIU/mL    Comment: Performed by a 3rd Generation assay with a functional sensitivity of <=0.01 uIU/mL. Performed at [facility], [address] [place], [place] [address]   CBC with Differential/Platelet     Status: Abnormal    Collection Time: 02/28/18  1:48 PM  Result Value Ref Range   WBC 12.7 (H) 4.0 - 10.5 K/uL   RBC 4.00 3.87 - 5.11 MIL/uL   Hemoglobin 12.8 12.0 - 15.0 g/dL   HCT 63.8 75.6 - 43.3 %   MCV 99.0 80.0 - 100.0 fL   MCH 32.0 26.0 - 34.0 pg   MCHC 32.3 30.0 - 36.0 g/dL   RDW 29.5 18.8 - 41.6 %   Platelets 420 (H) 150 - 400 K/uL   nRBC 0.0 0.0 - 0.2 %   Neutrophils Relative % 74 %   Neutro Abs 9.6 (H) 1.7 - 7.7 K/uL   Lymphocytes Relative 16 %   Lymphs Abs 2.0 0.7 - 4.0 K/uL   Monocytes Relative 6 %   Monocytes Absolute 0.7 0.1 - 1.0 K/uL  Eosinophils Relative 2 %   Eosinophils Absolute 0.2 0.0 - 0.5 K/uL   Basophils Relative 1 %   Basophils Absolute 0.1 0.0 - 0.1 K/uL   Immature Granulocytes 1 %   Abs Immature Granulocytes 0.15 (H) 0.00 - 0.07 K/uL    Comment: Performed at [facility], [address] [place], [place] [address]  Lithium level     Status: None   Collection Time: 03/01/18  6:37 AM  Result Value Ref Range   Lithium Lvl 0.62 0.60 - 1.20 mmol/L    Comment: Performed at [facility], [address] [place], [place] [address]    Blood Alcohol level:  Lab Results  Component Value Date   [facility] <10 02/27/2018   ETH <5 08/06/2016    Metabolic Disorder Labs: Lab Results  Component Value Date   HGBA1C 5.8 (H) 02/28/2018   MPG 119.76 02/28/2018   MPG 123 (H) 02/05/2012   Lab Results  Component Value Date   PROLACTIN 39.1 (H) 06/08/2015   Lab Results  Component Value Date   CHOL 242 (H) 02/28/2018   TRIG 728 (H) 02/28/2018   HDL 32 (L) 02/28/2018   CHOLHDL 7.6 02/28/2018   VLDL UNABLE TO CALCULATE IF TRIGLYCERIDE OVER 400 mg/dL 09/81/1914   LDLCALC UNABLE TO CALCULATE IF TRIGLYCERIDE OVER 400 mg/dL 78/29/5621   LDLCALC 308 (H) 06/08/2015    Physical Findings: AIMS: Facial and Oral Movements Muscles of Facial Expression: None, normal Lips and Perioral Area: None, normal Jaw: None, normal Tongue: None, normal,Extremity Movements Upper  (arms, wrists, hands, fingers): None, normal Lower (legs, knees, ankles, toes): None, normal, Trunk Movements Neck, shoulders, hips: None, normal, Overall Severity Severity of abnormal movements (highest score from questions above): None, normal Incapacitation due to abnormal movements: None, normal Patient's awareness of abnormal movements (rate only patient's report): No Awareness, Dental Status Current problems with teeth and/or dentures?: No Does patient usually wear dentures?: No  CIWA:  CIWA-Ar Total: 2 COWS:  COWS Total Score: 2  Musculoskeletal: Strength & Muscle Tone: within normal limits Gait & Station: normal Patient leans: N/A  Psychiatric Specialty Exam: Physical Exam  Nursing note and vitals reviewed. Constitutional: She appears well-developed and well-nourished.  HENT:  Head: Normocephalic and atraumatic.  Eyes: Pupils are equal, round, and reactive to light. Conjunctivae are normal.  Neck: Normal range of motion.  Cardiovascular: Regular rhythm and normal heart sounds.  Respiratory: Effort normal. No respiratory distress.  GI: Soft.  Musculoskeletal: Normal range of motion.  Neurological: She is alert.  Skin: Skin is warm and dry.  Psychiatric: Her speech is delayed. She is slowed and actively hallucinating. Thought content is paranoid. Cognition and memory are impaired. She expresses impulsivity. She exhibits a depressed mood. She expresses suicidal ideation.    Review of Systems  Constitutional: Negative.   HENT: Negative.   Eyes: Negative.   Respiratory: Negative.   Cardiovascular: Negative.   Gastrointestinal: Negative.   Musculoskeletal: Negative.   Skin: Negative.   Neurological: Negative.   Psychiatric/Behavioral: Positive for depression, hallucinations and suicidal ideas. The patient is nervous/anxious and has insomnia.     Blood pressure 116/83, pulse 82, temperature 98.6 F (37 C), temperature source Oral, resp. rate 18, height 5\' 9"  (1.753 m),  weight 120.2 kg, SpO2 96 %.Body mass index is 39.13 kg/m.  General Appearance: Casual  Eye Contact:  Fair  Speech:  Slow  Volume:  Decreased  Mood:  Depressed  Affect:  Depressed  Thought Process:  Disorganized  Orientation:  Full (Time, Place, and Person)  Thought Content:  Logical and Hallucinations: Auditory Command:  Suicidal command  Suicidal Thoughts:  Yes.  with intent/plan  Homicidal Thoughts:  No  Memory:  Immediate;   Fair Recent;   Fair Remote;   Fair  Judgement:  Fair  Insight:  Fair  Psychomotor Activity:  Decreased  Concentration:  Concentration: Fair  Recall:  [employer] of Knowledge:  Fair  Language:  Fair  Akathisia:  No  Handed:  Right  AIMS (if indicated):     Assets:  Desire for Improvement Housing Resilience  ADL's:  Intact  Cognition:  Impaired,  Mild  Sleep:  Number of Hours: 7.15     Treatment Plan Summary: Daily contact with patient to assess and evaluate symptoms and progress in treatment, Medication management and Plan Patient is charted as sleeping 7 hours but is complaining that she slept very poorly and was constantly restless.  She is tearful today and looks very distracted and is complaining that her hallucinations are bad.  As noted previously the plan is to start her on clozapine and I am going to increase that dose to 50 mg tonight.  Meanwhile I am going to change her other antipsychotic.  Patient had already been getting loxapine at 100 mg a day which did not seem to be doing anything.  I will change that to Latuda 40 mg twice a day.  I am picking this because she has had a lot of concern about weight in the past.  I will also add Restoril 30 mg at night to help with sleep and anxiety.  Patient understands and agrees to plans Case discussed with nursing.  Mordecai Rasmussen, MD 03/01/2018, 2:08 PM

## 2018-03-01 NOTE — Plan of Care (Addendum)
Patient found in day room upon my arrival. Patient is visible and social this evening. Patient is polite and pleasant upon approach. Continues to report AH but reports it is manageable at this time. Denies SI/HI/AVH at this time. Denies pain. Reports eating and voiding adequately. 1st dose Restoril given with education. Verbalized understanding. Requests and is given breathing treatment prior to setting her up on C-PAP machine. Compliant with HS medication and staff direction. Q 15 minute checks maintained. Will continue to monitor throughout the shift. @0430 , patient complains of migraine HA 8/10, requests and is given Fioricet. Will monitor for efficacy. Patient slept 6 hours. Restlessly. Compliant with am Synthroid. Will endorse care to oncoming shift.  Problem: Education: Goal: Knowledge of General Education information will improve Description Including pain rating scale, medication(s)/side effects and non-pharmacologic comfort measures Outcome: Progressing   Problem: Nutrition: Goal: Adequate nutrition will be maintained Outcome: Progressing   Problem: Coping: Goal: Level of anxiety will decrease Outcome: Progressing   Problem: Coping: Goal: Coping ability will improve Outcome: Progressing
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
## 2018-03-01 NOTE — Therapy (Signed)
Pt's home unit inspected prior to use.  No frayed cords.  Home unit is in working condition.  Distilled water added to the unit.  Pt applied nasal pillows.  Home unit was plugged into a red outlet.  No issues. Pt in no distress.

## 2018-03-01 NOTE — Progress Notes (Signed)
Patient received bedtime medications including Trazodone. Received respiratory care and currently sleeping.  No sign of distress noted. Safety precautions maintained.

## 2018-03-02 ENCOUNTER — Other Ambulatory Visit: Payer: Self-pay

## 2018-03-02 DIAGNOSIS — Z5181 Encounter for therapeutic drug level monitoring: Secondary | ICD-10-CM

## 2018-03-02 LAB — URINALYSIS, COMPLETE (UACMP) WITH MICROSCOPIC
BILIRUBIN URINE: NEGATIVE
Glucose, UA: NEGATIVE mg/dL
KETONES UR: NEGATIVE mg/dL
Nitrite: POSITIVE — AB
PH: 6 (ref 5.0–8.0)
PROTEIN: NEGATIVE mg/dL
Specific Gravity, Urine: 1.008 (ref 1.005–1.030)
WBC, UA: >50 WBC/hpf — ABNORMAL HIGH (ref 0–5)

## 2018-03-02 MED ORDER — CLONAZEPAM 0.5 MG PO TABS
0.2500 mg | ORAL_TABLET | Freq: Four times a day (QID) | ORAL | Status: DC | PRN
  Administered 2018-03-02 – 2018-03-08 (×6): 0.25 mg via ORAL
  Filled 2018-03-02 (×7): qty 1

## 2018-03-02 MED ORDER — LAMOTRIGINE 25 MG PO TABS: 50.0000 mg | ORAL_TABLET | Freq: Every day | ORAL | Status: DC

## 2018-03-02 MED ORDER — MIRTAZAPINE 15 MG PO TABS
15.0000 mg | ORAL_TABLET | Freq: Every day | ORAL | Status: DC
  Administered 2018-03-02 – 2018-03-09 (×8): 15 mg via ORAL
  Filled 2018-03-02 (×8): qty 1

## 2018-03-02 MED ORDER — CLOZAPINE 25 MG PO TABS: 75.0000 mg | ORAL_TABLET | Freq: Every day | ORAL | Status: DC

## 2018-03-02 MED ORDER — LITHIUM CARBONATE ER 300 MG PO TBCR
300.0000 mg | EXTENDED_RELEASE_TABLET | Freq: Two times a day (BID) | ORAL | Status: DC
  Administered 2018-03-02 – 2018-03-08 (×12): 300 mg via ORAL
  Filled 2018-03-02 (×12): qty 1

## 2018-03-02 MED ORDER — HALOPERIDOL 1 MG PO TABS: 2.0000 mg | ORAL_TABLET | Freq: Four times a day (QID) | ORAL | Status: DC | PRN

## 2018-03-02 MED ORDER — LAMOTRIGINE 25 MG PO TABS
50.0000 mg | ORAL_TABLET | Freq: Two times a day (BID) | ORAL | Status: DC
  Administered 2018-03-02 – 2018-03-04 (×4): 50 mg via ORAL
  Filled 2018-03-02 (×4): qty 2

## 2018-03-02 MED ORDER — PRAZOSIN HCL 1 MG PO CAPS
1.0000 mg | ORAL_CAPSULE | Freq: Every day | ORAL | Status: DC
  Administered 2018-03-02 – 2018-03-03 (×2): 1 mg via ORAL
  Filled 2018-03-02 (×4): qty 1

## 2018-03-02 NOTE — Progress Notes (Signed)
Patient ID: Danielle [name], female   DOB: 1970-11-06, 47 y.o.   MRN: [id number]  EKG shows prolonged Qtc of 561. Will hold clozapine tonight and recheck Ekg tomorrow morning

## 2018-03-02 NOTE — Plan of Care (Signed)
Patient working on Scientist, research (medical)  and coping . Denies suicidal ideations . Working on feelings of  self . Compliant  with  medication. Understanding of medication given . No anger outburst  and  no  safety concerns   Problem: Education: Goal: Ability to make informed decisions regarding treatment will improve Outcome: Progressing   Problem: Coping: Goal: Coping ability will improve Outcome: Progressing   Problem: Self-Concept: Goal: Ability to disclose and discuss suicidal ideas will improve Outcome: Progressing Goal: Will verbalize positive feelings about self Outcome: Progressing   Problem: Education: Goal: Utilization of techniques to improve thought processes will improve Outcome: Progressing Goal: Knowledge of the prescribed therapeutic regimen will improve Outcome: Progressing   Problem: Coping: Goal: Coping ability will improve Outcome: Progressing Goal: Will verbalize feelings Outcome: Progressing   Problem: Activity: Goal: Will identify at least one activity in which they can participate Outcome: Progressing   Problem: Health Behavior/Discharge Planning: Goal: Identification of resources available to assist in meeting health care needs will improve Outcome: Progressing   Problem: Self-Concept: Goal: Will verbalize positive feelings about self Outcome: Progressing   Problem: Role Relationship: Goal: Ability to communicate needs accurately will improve Outcome: Progressing Goal: Ability to interact with others will improve Outcome: Progressing   Problem: Safety: Goal: Ability to redirect hostility and anger into socially appropriate behaviors will improve Outcome: Progressing Goal: Ability to remain free from injury will improve Outcome: Progressing

## 2018-03-02 NOTE — Progress Notes (Signed)
Recreation Therapy Notes  INPATIENT RECREATION THERAPY ASSESSMENT  Patient Details Name: Danielle [name] MRN: [id number] DOB: 1971-03-17 Today's Date: 03/02/2018       Information Obtained From: Patient  Able to Participate in Assessment/Interview: Yes  Patient Presentation: Responsive  Reason for Admission (Per Patient): Active Symptoms, Suicidal Ideation, Suicide Attempt, Other (Comments)(Voices)  Patient Stressors: Death  Coping Skills:   Deep Breathing, Talk, Other (Comment)(Relaxing body)  Leisure Interests (2+):  Individual - TV  Frequency of Recreation/Participation: Weekly  Awareness of Community Resources:  Yes  Community Resources:  [employer]  Current Use: No  If no, Barriers?: Financial  Expressed Interest in [employer] Information: Yes  County of Residence:  [place]  Patient Main Form of Transportation: Car  Patient Strengths:  Caring, honest  Patient Identified Areas of Improvement:  To know I am important part of society  Patient Goal for Hospitalization:  To manage my voices and find an outlet in my area to be more social  Current SI (including self-harm):  No  Current HI:  No  Current AVH: Yes(I still hear the voices)  Staff Intervention Plan: Group Attendance, Collaborate with Interdisciplinary Treatment Team  Consent to Intern Participation: N/A  Danielle [name] 03/02/2018, 3:53 PM

## 2018-03-02 NOTE — Progress Notes (Signed)
   03/02/18 1030  Clinical Encounter Type  Visited With Patient;Health care provider  Visit Type Initial;Spiritual support;Behavioral Health  Referral From Social work  Consult/Referral To Chaplain  Spiritual Encounters  Spiritual Needs Emotional;Other (Comment)   [facility] attended the patient's treatment team. The patient had a good nights sleep and set goals to learn to manage the voices that she hears and become more social. Danielle [name] stated that she had been referred to [facility] but has not received any contact from them. MD will follow up with the [facility] team.

## 2018-03-02 NOTE — Tx Team (Addendum)
Interdisciplinary Treatment and Diagnostic Plan Update  03/02/2018 Time of Session: 10:30am Danielle [name] MRN: [id number]  Principal Diagnosis: Paranoid schizophrenia (HCC)  Secondary Diagnoses: Principal Problem:   Paranoid schizophrenia (HCC) Active Problems:   Post traumatic stress disorder (PTSD)   Hypothyroidism   Severe major depression, single episode, with psychotic features (HCC)   Suicidal ideation   Current Medications:  Current Facility-Administered Medications  Medication Dose Route Frequency Provider Last Rate Last Dose  . acetaminophen (TYLENOL) tablet 650 mg  650 mg Oral Q6H PRN Clapacs, John T, MD      . albuterol (PROVENTIL) (2.5 MG/3ML) 0.083% nebulizer solution 2.5 mg  2.5 mg Nebulization Q6H PRN Clapacs, Jackquline Denmark, MD   2.5 mg at 03/01/18 2258  . alum & mag hydroxide-simeth (MAALOX/MYLANTA) 200-200-20 MG/5ML suspension 30 mL  30 mL Oral Q4H PRN Clapacs, John T, MD      . aspirin EC tablet 81 mg  81 mg Oral Q breakfast Clapacs, Jackquline Denmark, MD   81 mg at 03/02/18 0815  . butalbital-acetaminophen-caffeine (FIORICET, ESGIC) 50-325-40 MG per tablet 2 tablet  2 tablet Oral Q6H PRN Clapacs, Jackquline Denmark, MD   2 tablet at 03/02/18 0434  . clonazePAM (KLONOPIN) tablet 0.5 mg  0.5 mg Oral QID PRN Clapacs, Jackquline Denmark, MD   0.5 mg at 03/01/18 1753  . cloZAPine (CLOZARIL) tablet 75 mg  75 mg Oral QHS McNew, Holly R, MD      . diltiazem (CARDIZEM CD) 24 hr capsule 120 mg  120 mg Oral Daily Clapacs, Jackquline Denmark, MD   120 mg at 03/02/18 0815  . folic acid (FOLVITE) tablet 1 mg  1 mg Oral Daily Clapacs, John T, MD   1 mg at 03/02/18 0816  . hydrochlorothiazide (HYDRODIURIL) tablet 12.5 mg  12.5 mg Oral Daily Clapacs, Jackquline Denmark, MD   12.5 mg at 03/02/18 0816  . ibuprofen (ADVIL,MOTRIN) tablet 800 mg  800 mg Oral Q8H PRN Clapacs, Jackquline Denmark, MD   800 mg at 02/28/18 0920  . lamoTRIgine (LAMICTAL) tablet 50 mg  50 mg Oral BID McNew, Ileene Hutchinson, MD      . levothyroxine (SYNTHROID, LEVOTHROID) tablet 100 mcg  100 mcg  Oral Q0600 Clapacs, Jackquline Denmark, MD   100 mcg at 03/02/18 [phone]  . lithium carbonate (LITHOBID) CR tablet 300 mg  300 mg Oral Q12H McNew, Holly R, MD      . magnesium hydroxide (MILK OF MAGNESIA) suspension 30 mL  30 mL Oral Daily PRN Clapacs, John T, MD      . mirtazapine (REMERON) tablet 15 mg  15 mg Oral QHS McNew, Holly R, MD      . prazosin (MINIPRESS) capsule 1 mg  1 mg Oral QHS McNew, Ileene Hutchinson, MD       PTA Medications: Medications Prior to Admission  Medication Sig Dispense Refill Last Dose  . lamoTRIgine (LAMICTAL) 25 MG tablet Take 50 mg by mouth 2 (two) times daily.     Marland Kitchen acetaminophen (TYLENOL 8 HOUR) 650 MG CR tablet Take 1 tablet (650 mg total) by mouth every 8 (eight) hours as needed for pain. (Patient not taking: Reported on 02/27/2018) 30 tablet 0 Not Taking at Unknown time  . aspirin EC 81 MG tablet Take 1 tablet (81 mg total) by mouth daily with breakfast. (Patient not taking: Reported on 02/27/2018) 30 tablet 0 Not Taking at Unknown time  . clonazePAM (KLONOPIN) 0.5 MG tablet Take 1 tablet (0.5 mg total) by mouth 4 (four)  times daily as needed for anxiety. 120 tablet 1 Taking  . diltiazem (DILACOR XR) 120 MG 24 hr capsule Take 120 mg by mouth daily.   Taking  . eletriptan (RELPAX) 40 MG tablet TAKE 1 TAB AT ONSET OF HEADACHES. MAY REPEAT IN 2 HOURS.MAX 2/24 HRS. FOR 30 DAYS  6 Taking  . folic acid (FOLVITE) 1 MG tablet Take 1 tablet (1 mg total) by mouth daily. 30 tablet 0 Taking  . hydrochlorothiazide (HYDRODIURIL) 12.5 MG tablet TAKE ONE TABLET (12.5 MG DOSE) BY MOUTH DAILY. TAKE ON TABLET BY MOUTH ONCE A DAY IN THE MORNING.  0 Taking  . ibuprofen (ADVIL,MOTRIN) 800 MG tablet Take 800 mg by mouth every 8 (eight) hours as needed (pain).   Taking  . levothyroxine (SYNTHROID, LEVOTHROID) 100 MCG tablet TAKE ONE TABLET (100 MCG DOSE) BY MOUTH DAILY.  5 Taking  . lithium carbonate (ESKALITH) 450 MG CR tablet Take 1 tablet (450 mg total) by mouth daily. 90 tablet 0 Taking  . loxapine  (LOXITANE) 50 MG capsule Take 1 capsule (50 mg total) by mouth 2 (two) times daily. for acute psychosis 180 capsule 0   . PARoxetine (PAXIL-CR) 37.5 MG 24 hr tablet Take 1 tablet (37.5 mg total) by mouth every morning. 90 tablet 0 Taking  . POTASSIUM PO Take by mouth.   Taking  . prazosin (MINIPRESS) 2 MG capsule Take 1 capsule (2 mg total) by mouth at bedtime. 90 capsule 0 Taking  . TROKENDI XR 100 MG CP24 Take 1 tablet by mouth daily.  3 Taking  . TROKENDI XR 200 MG CP24 Take 1 tablet by mouth daily.  3 Taking  . Vitamin D, Ergocalciferol, (DRISDOL) 50000 units CAPS capsule Take 50,000 Units by mouth every 7 (seven) days. saturday   Taking    Patient Stressors: Financial difficulties Medication change or noncompliance Substance abuse  Patient Strengths: Capable of independent living Motivation for treatment/growth Supportive family/friends  Treatment Modalities: Medication Management, Group therapy, Case management,  1 to 1 session with clinician, Psychoeducation, Recreational therapy.   Physician Treatment Plan for Primary Diagnosis: Paranoid schizophrenia (HCC) Long Term Goal(s): Improvement in symptoms so as ready for discharge Improvement in symptoms so as ready for discharge   Short Term Goals: Ability to verbalize feelings will improve Ability to disclose and discuss suicidal ideas Ability to maintain clinical measurements within normal limits will improve Compliance with prescribed medications will improve  Medication Management: Evaluate patient's response, side effects, and tolerance of medication regimen.  Therapeutic Interventions: 1 to 1 sessions, Unit Group sessions and Medication administration.  Evaluation of Outcomes: Progressing  Physician Treatment Plan for Secondary Diagnosis: Principal Problem:   Paranoid schizophrenia (HCC) Active Problems:   Post traumatic stress disorder (PTSD)   Hypothyroidism   Severe major depression, single episode, with psychotic  features (HCC)   Suicidal ideation  Long Term Goal(s): Improvement in symptoms so as ready for discharge Improvement in symptoms so as ready for discharge   Short Term Goals: Ability to verbalize feelings will improve Ability to disclose and discuss suicidal ideas Ability to maintain clinical measurements within normal limits will improve Compliance with prescribed medications will improve     Medication Management: Evaluate patient's response, side effects, and tolerance of medication regimen.  Therapeutic Interventions: 1 to 1 sessions, Unit Group sessions and Medication administration.  Evaluation of Outcomes: Progressing   RN Treatment Plan for Primary Diagnosis: Paranoid schizophrenia (HCC) Long Term Goal(s): Knowledge of disease and therapeutic regimen to maintain health will  improve  Short Term Goals: Ability to verbalize feelings will improve, Ability to identify and develop effective coping behaviors will improve and Compliance with prescribed medications will improve  Medication Management: RN will administer medications as ordered by provider, will assess and evaluate patient's response and provide education to patient for prescribed medication. RN will report any adverse and/or side effects to prescribing provider.  Therapeutic Interventions: 1 on 1 counseling sessions, Psychoeducation, Medication administration, Evaluate responses to treatment, Monitor vital signs and CBGs as ordered, Perform/monitor CIWA, COWS, AIMS and Fall Risk screenings as ordered, Perform wound care treatments as ordered.  Evaluation of Outcomes: Progressing   LCSW Treatment Plan for Primary Diagnosis: Paranoid schizophrenia (HCC) Long Term Goal(s): Safe transition to appropriate next level of care at discharge, Engage patient in therapeutic group addressing interpersonal concerns.  Short Term Goals: Engage patient in aftercare planning with referrals and resources, Increase ability to  appropriately verbalize feelings, Increase emotional regulation, Identify triggers associated with mental health/substance abuse issues and Increase skills for wellness and recovery  Therapeutic Interventions: Assess for all discharge needs, 1 to 1 time with Social worker, Explore available resources and support systems, Assess for adequacy in community support network, Educate family and significant other(s) on suicide prevention, Complete Psychosocial Assessment, Interpersonal group therapy.  Evaluation of Outcomes: Progressing   Progress in Treatment: Attending groups: No. Participating in groups: No. Taking medication as prescribed: Yes. Toleration medication: Yes. Family/Significant other contact made: Yes, individual(s) contacted:  Lerry Liner, husband Patient understands diagnosis: Yes. Discussing patient identified problems/goals with staff: Yes. Medical problems stabilized or resolved: Yes. Denies suicidal/homicidal ideation: Yes. Issues/concerns per patient self-inventory: Yes. Pt wants information on her referral to [facility] Other: NA  New problem(s) identified: Yes, Describe:  "To manage my voices, find outlets in my area where I can be more social"  New Short Term/Long Term Goal(s):"To manage my voices, find outlet in my area where I can be more social"  Patient Goals:  "To manage my voices, find outlet in my area where I can be more social"  Discharge Plan or Barriers: Dx home and follow up with outpatient treatment  Reason for Continuation of Hospitalization: Medication stabilization  Estimated Length of Stay: 3-5 days  Recreational Therapy: Patient Stressors: Death Patient Goal: Patient will identify 3 benefits of self-care within 5 recreation therapy group sessions  Attendees: Patient:Danielle [name] 03/02/2018 11:26 AM  Physician: Dr. Johnella Moloney 03/02/2018 11:26 AM  Nursing: Hulan Amato, RN 03/02/2018 11:26 AM  RN Care Manager: 03/02/2018 11:26 AM  Social Worker:  Johny Shears, LCSWA 03/02/2018 11:26 AM  Recreational Therapist: Danella Deis. Dreama Saa, LRT 03/02/2018 11:26 AM  Other: Lowella Dandy, LCSW 03/02/2018 11:26 AM  Other: Damian Leavell, Chaplain 03/02/2018 11:26 AM  Other: 03/02/2018 11:26 AM    Scribe for Treatment Team: Suzan Slick, LCSW 03/02/2018 11:26 AM

## 2018-03-02 NOTE — Progress Notes (Signed)
Recreation Therapy Notes   Date: 03/02/2018  Time: 9:30 am   Location: Craft room   Behavioral response: N/A   Intervention Topic: Coping  Discussion/Intervention: Patient did not attend group.   Clinical Observations/Feedback:  Patient did not attend group.   Delora Gravatt LRT/CTRS        Yassin Scales 03/02/2018 11:49 AM

## 2018-03-02 NOTE — [facility] Suicide Risk Assessment (Signed)
[facility] INPATIENT:  Family/Significant Other Suicide Prevention Education  Suicide Prevention Education:  Education Completed; Rubye Strohmeyer, husband [phone] has been identified by the patient as the family member/significant other with whom the patient will be residing, and identified as the person(s) who will aid the patient in the event of a mental health crisis (suicidal ideations/suicide attempt).  With written consent from the patient, the family member/significant other has been provided the following suicide prevention education, prior to the and/or following the discharge of the patient.  The suicide prevention education provided includes the following:  Suicide risk factors  Suicide prevention and interventions  National Suicide Hotline telephone number  [facility] assessment telephone number  [facility] Emergency Assistance 911  [facility] and/or Residential Mobile Crisis Unit telephone number  Request made of family/significant other to:  Remove weapons (e.g., guns, rifles, knives), all items previously/currently identified as safety concern.    Remove drugs/medications (over-the-counter, prescriptions, illicit drugs), all items previously/currently identified as a safety concern.  The family member/significant other verbalizes understanding of the suicide prevention education information provided.  The family member/significant other agrees to remove the items of safety concern listed above.  Husband denies pt having any access to guns or weapons in the home. Husband says he is in agreement with pt returning home "when they get her meds better, she's sleeping better and not hearing voices." Husband reports he is now putting pt medication in a lock box to prevent her from misusing medication.  Marvelyn Bouchillon T Adriaan Maltese 03/02/2018, 9:49 AM

## 2018-03-02 NOTE — Progress Notes (Addendum)
[facility] MD Progress Note  03/02/2018 1:58 PM Danielle [name]  MRN:  [id number] Subjective:  History was reviewed with patient. She is sleeping soundly with her CPAP when I entered her room. She had brighter affect and was smiling. She stated that she had a recent suicide attempt by overdosing on medications. She was hospitalized at [facility] at that time. She states that she was under a significantly amount of stress during that time including the anniversary of several loved ones deaths. Her daughter is also in jail which was a major stressor. These things triggered an acute worsening of Ah telling her very negative and derogatory things. Prior to this, she had been doing well and had not had a hospitalization for over a year. She states that she used to have "a lot of hospitalizations." She states that so far today she has not had any auditory hallucinations. She denies SI currently "because I haven't heard voices yet." She states that yesterday was a bad day and she "randomly would hear voices that were distressing.' She states that it could be when she was in the milieu or it could be when she was alone. There was no specific trigger. She states that the voices, "tell me I'm stupid and that I don't deserve to live." She states that the voices are always there but "usually I can push them to the back on my mind." Lately, they have not been manageable. She states that when they are distressing she has thoughts of not wanting to live. She states that her husband keeps her going and he is a great support for her. She states that she has many coping skills including deep breathing and she learns a lot by seeing her therapist. Pt is organized in thoughts. She states that last night "was the best sleep I"ve had in a long time."  Later in the afternoon, she came out of her oom crying and holding her ears stating that the voices were bad. She took prn Klonopin which was helpful.    Principal Problem: Paranoid schizophrenia  (HCC) Diagnosis:   Patient Active Problem List   Diagnosis Date Noted  . Suicidal ideation [R45.851] 02/28/2018  . 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 Frequent falls [R29.6] 06/18/2015  . Tobacco use disorder [F17.200] 06/08/2015  . Hyperammonemia (HCC) [E72.20] 06/08/2015  . Paranoid schizophrenia (HCC) [F20.0]   . Migraine headache [G43.909] 06/04/2012  . Seizure (HCC) [R56.9] 06/04/2012  . Hypothyroidism [E03.9] 06/04/2012  . OSA (obstructive sleep apnea) [G47.33] 08/29/2011  . Post traumatic stress disorder (PTSD) [F43.10] 07/31/2011   Total Time spent with patient: 30 minutes  Past Psychiatric History: See h&P  Past Medical History:  Past Medical History:  Diagnosis Date  . Anemia   . Anxiety   . Bipolar 1 disorder (HCC)   . Depressed   . Headache(784.0)   . Heart murmur   . Hypothyroidism   . PTSD (post-traumatic stress disorder)   . Schizophrenia (HCC)   . Seizures (HCC)   . Shortness of breath     Past Surgical History:  Procedure Laterality Date  . FOOT SURGERY    . LEG SURGERY    . NASAL FRACTURE SURGERY    . TUBAL LIGATION  1996   Family History:  Family History  Adopted: Yes  Problem Relation Age of Onset  . Drug abuse Daughter        heroin addiction   Family Psychiatric  History: See H&P  Social History:  Social History   Substance and Sexual Activity  Alcohol Use No  . Alcohol/week: 0.0 standard drinks     Social History   Substance and Sexual Activity  Drug Use No    Social History   Socioeconomic History  . Marital status: Married    Spouse name: Not on file  . Number of children: 2  . Years of education: Not on file  . Highest education level: Not on file  Occupational History  . Occupation: unemployed    Associate Professor: NOT EMPLOYED  Social Needs  . Financial resource strain: Not on file  . Food insecurity:    Worry: Not on file    Inability: Not on file  . Transportation  needs:    Medical: Not on file    Non-medical: Not on file  Tobacco Use  . Smoking status: Former Smoker    Packs/day: 1.50    Years: 30.00    Pack years: 45.00    Types: Cigarettes    Last attempt to quit: 09/20/2011    Years since quitting: 6.4  . Smokeless tobacco: Current User  Substance and Sexual Activity  . Alcohol use: No    Alcohol/week: 0.0 standard drinks  . Drug use: No  . Sexual activity: Yes    Partners: Male    Birth control/protection: Surgical  Lifestyle  . Physical activity:    Days per week: Not on file    Minutes per session: Not on file  . Stress: Not on file  Relationships  . Social connections:    Talks on phone: Not on file    Gets together: Not on file    Attends religious service: Not on file    Active member of club or organization: Not on file    Attends meetings of clubs or organizations: Not on file    Relationship status: Not on file  Other Topics Concern  . Not on file  Social History Narrative   Pt is adopted. Unsure of family history.   Additional Social History:                         Sleep: Good  Appetite:  Good  Current Medications: Current Facility-Administered Medications  Medication Dose Route Frequency Provider Last Rate Last Dose  . acetaminophen (TYLENOL) tablet 650 mg  650 mg Oral Q6H PRN Clapacs, John T, MD      . albuterol (PROVENTIL) (2.5 MG/3ML) 0.083% nebulizer solution 2.5 mg  2.5 mg Nebulization Q6H PRN Clapacs, Jackquline Denmark, MD   2.5 mg at 03/01/18 2258  . alum & mag hydroxide-simeth (MAALOX/MYLANTA) 200-200-20 MG/5ML suspension 30 mL  30 mL Oral Q4H PRN Clapacs, John T, MD      . aspirin EC tablet 81 mg  81 mg Oral Q breakfast Clapacs, Jackquline Denmark, MD   81 mg at 03/02/18 0815  . butalbital-acetaminophen-caffeine (FIORICET, ESGIC) 50-325-40 MG per tablet 2 tablet  2 tablet Oral Q6H PRN Clapacs, Jackquline Denmark, MD   2 tablet at 03/02/18 0434  . clonazePAM (KLONOPIN) tablet 0.5 mg  0.5 mg Oral QID PRN Clapacs, Jackquline Denmark, MD    0.5 mg at 03/02/18 1132  . cloZAPine (CLOZARIL) tablet 75 mg  75 mg Oral QHS Sheilyn Boehlke R, MD      . diltiazem (CARDIZEM CD) 24 hr capsule 120 mg  120 mg Oral Daily Clapacs, Jackquline Denmark, MD   120 mg at 03/02/18 0815  . folic acid (  FOLVITE) tablet 1 mg  1 mg Oral Daily Clapacs, John T, MD   1 mg at 03/02/18 0816  . haloperidol (HALDOL) tablet 2 mg  2 mg Oral Q6H PRN Aydrien Froman R, MD      . hydrochlorothiazide (HYDRODIURIL) tablet 12.5 mg  12.5 mg Oral Daily Clapacs, John T, MD   12.5 mg at 03/02/18 0816  . ibuprofen (ADVIL,MOTRIN) tablet 800 mg  800 mg Oral Q8H PRN Clapacs, Jackquline Denmark, MD   800 mg at 02/28/18 0920  . lamoTRIgine (LAMICTAL) tablet 50 mg  50 mg Oral BID Maclaine Ahola, Ileene Hutchinson, MD      . levothyroxine (SYNTHROID, LEVOTHROID) tablet 100 mcg  100 mcg Oral Q0600 Clapacs, Jackquline Denmark, MD   100 mcg at 03/02/18 [phone]  . lithium carbonate (LITHOBID) CR tablet 300 mg  300 mg Oral Q12H Hermena Swint R, MD      . magnesium hydroxide (MILK OF MAGNESIA) suspension 30 mL  30 mL Oral Daily PRN Clapacs, John T, MD      . mirtazapine (REMERON) tablet 15 mg  15 mg Oral QHS Averlee Swartz, Ileene Hutchinson, MD      . prazosin (MINIPRESS) capsule 1 mg  1 mg Oral QHS Jerrion Tabbert, Ileene Hutchinson, MD        Lab Results:  Results for orders placed or performed during the hospital encounter of 02/27/18 (from the past 48 hour(s))  Lithium level     Status: None   Collection Time: 03/01/18  6:37 AM  Result Value Ref Range   Lithium Lvl 0.62 0.60 - 1.20 mmol/L    Comment: Performed at [facility], [address] [place], [place] [address]    Blood Alcohol level:  Lab Results  Component Value Date   [facility] <10 02/27/2018   ETH <5 08/06/2016    Metabolic Disorder Labs: Lab Results  Component Value Date   HGBA1C 5.8 (H) 02/28/2018   MPG 119.76 02/28/2018   MPG 123 (H) 02/05/2012   Lab Results  Component Value Date   PROLACTIN 39.1 (H) 06/08/2015   Lab Results  Component Value Date   CHOL 242 (H) 02/28/2018   TRIG 728 (H)  02/28/2018   HDL 32 (L) 02/28/2018   CHOLHDL 7.6 02/28/2018   VLDL UNABLE TO CALCULATE IF TRIGLYCERIDE OVER 400 mg/dL 40/98/1191   LDLCALC UNABLE TO CALCULATE IF TRIGLYCERIDE OVER 400 mg/dL 47/82/9562   LDLCALC 130 (H) 06/08/2015    Physical Findings: AIMS: Facial and Oral Movements Muscles of Facial Expression: None, normal Lips and Perioral Area: None, normal Jaw: None, normal Tongue: None, normal,Extremity Movements Upper (arms, wrists, hands, fingers): None, normal Lower (legs, knees, ankles, toes): None, normal, Trunk Movements Neck, shoulders, hips: None, normal, Overall Severity Severity of abnormal movements (highest score from questions above): None, normal Incapacitation due to abnormal movements: None, normal Patient's awareness of abnormal movements (rate only patient's report): No Awareness, Dental Status Current problems with teeth and/or dentures?: No Does patient usually wear dentures?: No  CIWA:  CIWA-Ar Total: 2 COWS:  COWS Total Score: 2  Musculoskeletal: Strength & Muscle Tone: within normal limits Gait & Station: normal Patient leans: N/A  Psychiatric Specialty Exam: Physical Exam  Nursing note and vitals reviewed.   Review of Systems  All other systems reviewed and are negative.   Blood pressure 113/69, pulse 75, temperature 98.2 F (36.8 C), temperature source Oral, resp. rate 16, height 5\' 9"  (1.753 m), weight 120.2 kg, SpO2 94 %.Body mass index is 39.13 kg/m.  General Appearance: Disheveled  Eye Contact:  Fair  Speech:  Clear and Coherent  Volume:  Normal  Mood:  Depressed  Affect:  Labile  Thought Process:  Coherent and Goal Directed  Orientation:  Full (Time, Place, and Person)  Thought Content:  Logical  Suicidal Thoughts:  Yes.  without intent/plan  Homicidal Thoughts:  No  Memory:  Immediate;   Fair  Judgement:  Fair  Insight:  Fair  Psychomotor Activity:  Normal  Concentration:  Concentration: Fair  Recall:  [employer] of  Knowledge:  Fair  Language:  Fair  Akathisia:  No      Assets:  Resilience  ADL's:  Intact  Cognition:  WNL  Sleep:  Number of Hours: 6     Treatment Plan Summary: 47 yo female admitted due to worsening Ah and SI. Pt was started on Clozapine over the weekend. She has had severely medication trials with not much benefit. She does have history of trauma and reviewing her history I do feel there is a component of complex PTSD and borderline personality traits which have caused self depreciating AH as opposed to actual psychotic hallucinations. She has not had much benefit from many antipsychotic medications. She is organized in her thoughts.  So far she is tolerating Clozapine well. She was also started on Latuda over the weekend. I would like to avoid too much polypharmacy at this time and feel that Clozapine trial would suffice at this time and getting this to an ideal dose would be priority before starting second anti-psychotic. She was also started on Restoril (in addition to Klonopin) for sleep however, I will discontinue Restoril to avoid polypharmacy with benzos. She has OSA which benzos are not an ideal choice for treating insomnia. It appears that Paxil was discontinued at her recent hospitalization due to polypharmacy so will not continue this here. Will start trial of Remeron for PTSD and help with insomnia. Trazodone causes worsening nightmares for her.   Plan:  Schizophrenia by history -Increase Clozapine to 75 mg qhs -Stop Latuda to prevent polypharmacy -order EKG  PTSD/Depression _Start Remeron 15 mg qhs for insomnia as well -Restart Prazosin 1 mg qhs -Change Lithium to 300 mg BID. It is unclear if she has been on 450 mg daily or BID. Lithium level is 0. 61.   Insomnia -Remeron _stop Restoril to avoid polypharmacy  Anxiety -Continue Klonopin but at lower dose of 0.25 mg QID prn which is prescribed by her outpatient psychiatrist  HTN -Cardizem and  HCTZ  Seizures/Pseudoseizures by history -Restart Lamictal 50 mg BID  Hypothyroid -Synthroid 100 mcg  Migraines -prn Fioricet  Elevated WBC -Check UA  Haskell Riling, MD 03/02/2018, 1:58 PM

## 2018-03-02 NOTE — Plan of Care (Addendum)
Patient found awake in bed upon my arrival. Patient is isolative to her room throughout the evening. Neither visible nor social. Patient is polite and pleasant upon approach. Continues to report depression and anxiety R/T AH. Patient given PRN Klonopin with positive results. Denies SI/HI at this time. Requests and is given a nebulizer Tx by respiratory before being placed on her C-PAP. Patient reports appetite is adequate. Denies pain. Endorses malaise. Compliant with HS medications. Provided urine sample upon request. Q 15 minute checks maintained. Will continue to monitor throughout the shift. @0120 , patient complains of migraine HA 8/10, given Fioricet. Will monitor for efficacy. Patient slept 7.5 hours. No apparent distress. Compliant with am Synthroid. Will endorse care to oncoming shift.  Problem: Coping: Goal: Coping ability will improve Outcome: Progressing   Problem: Education: Goal: Knowledge of the prescribed therapeutic regimen will improve Outcome: Progressing   Problem: Coping: Goal: Will verbalize feelings Outcome: Progressing   Problem: Role Relationship: Goal: Ability to communicate needs accurately will improve Outcome: Progressing

## 2018-03-02 NOTE — Progress Notes (Addendum)
D:Patient working on Scientist, research (medical)  and coping . Denies suicidal ideations . Working on feelings of  self . Compliant  with  medication. Understanding of medication given . No anger outburst  and  no  safety concerns  Patient approached Clinical research associate voicing  Of auditory hallucinations . Noted to have her hands  To her ears.  Writer approached room  Patient  noted to remain in bed  With hands to ears . Patient received  Medication .  Noted to be effective  Patient stated slept fair last night .Stated appetite good and energy level low Stated concentration poor . Stated on Depression scale  8, hopeless 8 and anxiety 8 .( low 0-10 high) Denies suicidal  homicidal ideations  .   No pain concerns . Appropriate ADL'S. Interacting with peers and staff.  A: Encourage patient participation with unit programming . Instruction  Given on  Medication , verbalize understanding. R: Voice no other concerns. Staff continue to monitor

## 2018-03-03 DIAGNOSIS — F4310 Post-traumatic stress disorder, unspecified: Secondary | ICD-10-CM

## 2018-03-03 DIAGNOSIS — Z5181 Encounter for therapeutic drug level monitoring: Secondary | ICD-10-CM

## 2018-03-03 MED ORDER — CLOZAPINE 25 MG PO TABS
75.0000 mg | ORAL_TABLET | Freq: Every day | ORAL | Status: DC
  Administered 2018-03-03 – 2018-03-05 (×3): 75 mg via ORAL
  Filled 2018-03-03 (×3): qty 3

## 2018-03-03 MED ORDER — BUTALBITAL-APAP-CAFFEINE 50-325-40 MG PO TABS
1.0000 | ORAL_TABLET | Freq: Three times a day (TID) | ORAL | Status: DC | PRN
  Administered 2018-03-06: 1 via ORAL
  Filled 2018-03-03 (×2): qty 1

## 2018-03-03 MED ORDER — FLUTICASONE FUROATE-VILANTEROL 200-25 MCG/INH IN AEPB
1.0000 | INHALATION_SPRAY | Freq: Every day | RESPIRATORY_TRACT | Status: DC
  Administered 2018-03-04 – 2018-03-09 (×6): 1 via RESPIRATORY_TRACT
  Filled 2018-03-03: qty 28

## 2018-03-03 MED ORDER — HALOPERIDOL 1 MG PO TABS
2.0000 mg | ORAL_TABLET | Freq: Four times a day (QID) | ORAL | Status: DC | PRN
  Administered 2018-03-03 – 2018-03-07 (×9): 2 mg via ORAL
  Filled 2018-03-03 (×9): qty 2

## 2018-03-03 MED ORDER — SULFAMETHOXAZOLE-TRIMETHOPRIM 800-160 MG PO TABS
1.0000 | ORAL_TABLET | Freq: Two times a day (BID) | ORAL | Status: AC
  Administered 2018-03-03 – 2018-03-09 (×14): 1 via ORAL
  Filled 2018-03-03 (×15): qty 1

## 2018-03-03 NOTE — Progress Notes (Signed)
Recreation Therapy Notes   Date: 03/03/2018  Time: 9:30 pm   Location: Craft Room   Behavioral response: N/A   Intervention Topic: Happiness  Discussion/Intervention: Patient did not attend group.   Clinical Observations/Feedback:  Patient did not attend group.   Yuridiana Formanek LRT/CTRS        Levenia Skalicky 03/03/2018 11:15 AM

## 2018-03-03 NOTE — Progress Notes (Addendum)
[facility] MD Progress Note  03/03/2018 10:09 AM Danielle [name]  MRN:  [id number] Subjective: Clozapine was held last night as her EKG last night showed QTc of 561. Repeat EKG this morning showed shortened QTc of 355. Pt has been in her room most of the morning. She is sound asleep this morning with her CPAP machine. She states that she is sleeping better with Remeron. She still has "bouts of really bad voices." She states that they come and go and there is no trigger for them. She had them last night in the middle of the night. She states, "They are really loud and negative when they come." She states that she no longer has active suicidal thoughts but states, "I'm just thinking about what's the point." (meaning of living). She states that her daughter has court today to see if she has jail time. That is a major stressor for her currently. She has not gone to any groups. She was strongly encouraged to attend groups today. She did shower this morning and taking better care of her hygiene. She is organized in her thought process.   Principal Problem: Paranoid schizophrenia (HCC) Diagnosis:   Patient Active Problem List   Diagnosis Date Noted  . Suicidal ideation [R45.851] 02/28/2018  . 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 Frequent falls [R29.6] 06/18/2015  . Tobacco use disorder [F17.200] 06/08/2015  . Hyperammonemia (HCC) [E72.20] 06/08/2015  . Paranoid schizophrenia (HCC) [F20.0]   . Migraine headache [G43.909] 06/04/2012  . Seizure (HCC) [R56.9] 06/04/2012  . Hypothyroidism [E03.9] 06/04/2012  . OSA (obstructive sleep apnea) [G47.33] 08/29/2011  . Post traumatic stress disorder (PTSD) [F43.10] 07/31/2011   Total Time spent with patient: 20 minutes  Past Psychiatric History: See H&P  Past Medical History:  Past Medical History:  Diagnosis Date  . Anemia   . Anxiety   . Bipolar 1 disorder (HCC)   . Depressed   . Headache(784.0)   .  Heart murmur   . Hypothyroidism   . PTSD (post-traumatic stress disorder)   . Schizophrenia (HCC)   . Seizures (HCC)   . Shortness of breath     Past Surgical History:  Procedure Laterality Date  . FOOT SURGERY    . LEG SURGERY    . NASAL FRACTURE SURGERY    . TUBAL LIGATION  1996   Family History:  Family History  Adopted: Yes  Problem Relation Age of Onset  . Drug abuse Daughter        heroin addiction   Family Psychiatric  History: See H&P Social History:  Social History   Substance and Sexual Activity  Alcohol Use No  . Alcohol/week: 0.0 standard drinks     Social History   Substance and Sexual Activity  Drug Use No    Social History   Socioeconomic History  . Marital status: Married    Spouse name: Not on file  . Number of children: 2  . Years of education: Not on file  . Highest education level: Not on file  Occupational History  . Occupation: unemployed    Associate Professor: NOT EMPLOYED  Social Needs  . Financial resource strain: Not on file  . Food insecurity:    Worry: Not on file    Inability: Not on file  . Transportation needs:    Medical: Not on file    Non-medical: Not on file  Tobacco Use  . Smoking status: Former Smoker    Packs/day: 1.50  Years: 30.00    Pack years: 45.00    Types: Cigarettes    Last attempt to quit: 09/20/2011    Years since quitting: 6.4  . Smokeless tobacco: Current User  Substance and Sexual Activity  . Alcohol use: No    Alcohol/week: 0.0 standard drinks  . Drug use: No  . Sexual activity: Yes    Partners: Male    Birth control/protection: Surgical  Lifestyle  . Physical activity:    Days per week: Not on file    Minutes per session: Not on file  . Stress: Not on file  Relationships  . Social connections:    Talks on phone: Not on file    Gets together: Not on file    Attends religious service: Not on file    Active member of club or organization: Not on file    Attends meetings of clubs or organizations:  Not on file    Relationship status: Not on file  Other Topics Concern  . Not on file  Social History Narrative   Pt is adopted. Unsure of family history.   Additional Social History:                         Sleep: Fair-improving  Appetite:  Good  Current Medications: Current Facility-Administered Medications  Medication Dose Route Frequency Provider Last Rate Last Dose  . acetaminophen (TYLENOL) tablet 650 mg  650 mg Oral Q6H PRN Clapacs, John T, MD      . albuterol (PROVENTIL) (2.5 MG/3ML) 0.083% nebulizer solution 2.5 mg  2.5 mg Nebulization Q6H PRN Clapacs, John T, MD   2.5 mg at 03/02/18 2135  . alum & mag hydroxide-simeth (MAALOX/MYLANTA) 200-200-20 MG/5ML suspension 30 mL  30 mL Oral Q4H PRN Clapacs, John T, MD      . aspirin EC tablet 81 mg  81 mg Oral Q breakfast Clapacs, Jackquline Denmark, MD   81 mg at 03/03/18 0817  . butalbital-acetaminophen-caffeine (FIORICET, ESGIC) 50-325-40 MG per tablet 2 tablet  2 tablet Oral Q6H PRN Clapacs, Jackquline Denmark, MD   2 tablet at 03/03/18 0129  . clonazePAM (KLONOPIN) tablet 0.25 mg  0.25 mg Oral QID PRN Haskell Riling, MD   0.25 mg at 03/02/18 2127  . cloZAPine (CLOZARIL) tablet 75 mg  75 mg Oral QHS Danney Bungert R, MD      . diltiazem (CARDIZEM CD) 24 hr capsule 120 mg  120 mg Oral Daily Clapacs, Jackquline Denmark, MD   120 mg at 03/03/18 0817  . folic acid (FOLVITE) tablet 1 mg  1 mg Oral Daily Clapacs, John T, MD   1 mg at 03/03/18 0817  . hydrochlorothiazide (HYDRODIURIL) tablet 12.5 mg  12.5 mg Oral Daily Clapacs, John T, MD   12.5 mg at 03/03/18 0817  . ibuprofen (ADVIL,MOTRIN) tablet 800 mg  800 mg Oral Q8H PRN Clapacs, Jackquline Denmark, MD   800 mg at 03/03/18 0826  . lamoTRIgine (LAMICTAL) tablet 50 mg  50 mg Oral BID Haskell Riling, MD   50 mg at 03/03/18 0817  . levothyroxine (SYNTHROID, LEVOTHROID) tablet 100 mcg  100 mcg Oral Q0600 Clapacs, Jackquline Denmark, MD   100 mcg at 03/03/18 0618  . lithium carbonate (LITHOBID) CR tablet 300 mg  300 mg Oral Q12H Brinna Divelbiss, Ileene Hutchinson, MD   300 mg at 03/03/18 0817  . magnesium hydroxide (MILK OF MAGNESIA) suspension 30 mL  30 mL Oral Daily PRN Clapacs, Jackquline Denmark,  MD      . mirtazapine (REMERON) tablet 15 mg  15 mg Oral QHS Mishawn Didion, Ileene Hutchinson, MD   15 mg at 03/02/18 2127  . prazosin (MINIPRESS) capsule 1 mg  1 mg Oral QHS Eudelia Hiltunen, Ileene Hutchinson, MD   1 mg at 03/02/18 2133  . sulfamethoxazole-trimethoprim (BACTRIM DS,SEPTRA DS) 800-160 MG per tablet 1 tablet  1 tablet Oral Q12H Millena Callins, Ileene Hutchinson, MD        Lab Results:  Results for orders placed or performed during the hospital encounter of 02/27/18 (from the past 48 hour(s))  Urinalysis, Complete w Microscopic     Status: Abnormal   Collection Time: 03/02/18 10:12 PM  Result Value Ref Range   Color, Urine YELLOW (A) YELLOW   APPearance TURBID (A) CLEAR   Specific Gravity, Urine 1.008 1.005 - 1.030   pH 6.0 5.0 - 8.0   Glucose, UA NEGATIVE NEGATIVE mg/dL   Hgb urine dipstick SMALL (A) NEGATIVE   Bilirubin Urine NEGATIVE NEGATIVE   Ketones, ur NEGATIVE NEGATIVE mg/dL   Protein, ur NEGATIVE NEGATIVE mg/dL   Nitrite POSITIVE (A) NEGATIVE   Leukocytes, UA LARGE (A) NEGATIVE   RBC / HPF 21-50 0 - 5 RBC/hpf   WBC, UA >50 (H) 0 - 5 WBC/hpf   Bacteria, UA FEW (A) NONE SEEN   Squamous Epithelial / LPF 6-10 0 - 5   WBC Clumps PRESENT     Comment: Performed at [facility], [address] [place], [place] [address]    Blood Alcohol level:  Lab Results  Component Value Date   [facility] <10 02/27/2018   ETH <5 08/06/2016    Metabolic Disorder Labs: Lab Results  Component Value Date   HGBA1C 5.8 (H) 02/28/2018   MPG 119.76 02/28/2018   MPG 123 (H) 02/05/2012   Lab Results  Component Value Date   PROLACTIN 39.1 (H) 06/08/2015   Lab Results  Component Value Date   CHOL 242 (H) 02/28/2018   TRIG 728 (H) 02/28/2018   HDL 32 (L) 02/28/2018   CHOLHDL 7.6 02/28/2018   VLDL UNABLE TO CALCULATE IF TRIGLYCERIDE OVER 400 mg/dL 30/86/5784   LDLCALC UNABLE TO CALCULATE IF  TRIGLYCERIDE OVER 400 mg/dL 69/62/9528   LDLCALC 413 (H) 06/08/2015    Physical Findings: AIMS: Facial and Oral Movements Muscles of Facial Expression: None, normal Lips and Perioral Area: None, normal Jaw: None, normal Tongue: None, normal,Extremity Movements Upper (arms, wrists, hands, fingers): None, normal Lower (legs, knees, ankles, toes): None, normal, Trunk Movements Neck, shoulders, hips: None, normal, Overall Severity Severity of abnormal movements (highest score from questions above): None, normal Incapacitation due to abnormal movements: None, normal Patient's awareness of abnormal movements (rate only patient's report): No Awareness, Dental Status Current problems with teeth and/or dentures?: No Does patient usually wear dentures?: No  CIWA:  CIWA-Ar Total: 2 COWS:  COWS Total Score: 2  Musculoskeletal: Strength & Muscle Tone: within normal limits Gait & Station: normal Patient leans: N/A  Psychiatric Specialty Exam: Physical Exam  Nursing note and vitals reviewed.   Review of Systems  Cardiovascular: Negative for chest pain.  Genitourinary: Negative for dysuria, frequency and urgency.  Psychiatric/Behavioral: Positive for depression, hallucinations and suicidal ideas.  All other systems reviewed and are negative.   Blood pressure 114/81, pulse 71, temperature 98.6 F (37 C), temperature source Oral, resp. rate 18, height 5\' 9"  (1.753 m), weight 120.2 kg, SpO2 98 %.Body mass index is 39.13 kg/m.  General Appearance: Casual  Eye Contact:  Good  Speech:  Clear and Coherent  Volume:  Normal  Mood:  Depressed  Affect:  Constricted  Thought Process:  Coherent and Goal Directed  Orientation:  Full (Time, Place, and Person)  Thought Content:  Logical  Suicidal Thoughts:  Yes with no plan or intent  Homicidal Thoughts:  No  Memory:  Immediate;   Fair  Judgement:  Fair  Insight:  Fair  Psychomotor Activity:  Normal  Concentration:  Concentration: Fair   Recall:  [employer] of Knowledge:  Fair  Language:  Fair  Akathisia:  No      Assets:  Resilience  ADL's:  Intact  Cognition:  WNL  Sleep:  Number of Hours: 7.5     Treatment Plan Summary: 47 yo female admitted due to worsening ah and SI. She had a "bout of negative voices" last night but states they are slightly improving. She reports passive SI. I continue to feel that alto of these "Voices" are self depreciating self talk related to complex PTSD and borderline personality disorder. They are atypical for a primary psychotic picture as they only come and go during times of stress. They have also been refractory to most anti-psychotic treatment she has tried. She is observed at times with her hands on her ears complaining of voices but when distracted she is talking and laughing with peers.  She is very organized in her thought process, she has full affect and no delay in her responses. She was started on Clozapine by recommendation of her outpatient psychiatrist. Dose was held last night as her EKG showed prolonged QTc of 561. Repeat EKG this morning shows improved Qtc of 355. She had recent cardiac and pulmonary workup in [facility] including ECHO and EKG.    Plan:  Schizophrenia by history -Restart Clozapine at 75 mg qhs now that QTc is normal at 355 -Add back prn Haldol 2 mg q6 hrs prn auditory hallucinations  PTSD/Depression -Continue Remeron 15 mg qhs -Increase Prazosin back to home dose of 2 mg qhs -Continue Lithium 300 mg BID. Will check level in 5 days  Insomnia -Remeron  Anxiety -Continue Klonopin 0.25 mg QID prn  HTN -Cardizem and HCTZ -Pt had ECHO done 02/15/18  Hypothyroid -Synthroid 100 mcg -TSH normal range  Migraines -prn Fioricet  Elevated WBC -UA shows positive nitrite, positive RBC, >50 WBC and few bacteria. Will start Bactrim BID for 7 days -Culture pending  Dispo -Discharge home when stable. She was apparently referred to ACT team at recent  hospitalization. Possibly [facility] in [facility], MD 03/03/2018, 10:09 AM

## 2018-03-03 NOTE — Plan of Care (Addendum)
Patient present in the milieu with a steady gait. Complained of a headache this morning, prn pain medication administered with effectiveness. Reports her anxiety a 6, depression 7 and denies having thought of wanting to harm herself. Complained of hearing voices this afternoon telling her that she was dumb and stupid. PRN medication administered with some relief. Affect remains flat and sad with minimal interaction with hers. Denies SI/HI/AVH at this time.Will continue to monitor.

## 2018-03-04 ENCOUNTER — Ambulatory Visit (HOSPITAL_COMMUNITY): Payer: BLUE CROSS/BLUE SHIELD | Admitting: Psychiatry

## 2018-03-04 LAB — GLUCOSE, CAPILLARY: Glucose-Capillary: 116 mg/dL — ABNORMAL HIGH (ref 70–99)

## 2018-03-04 MED ORDER — LAMOTRIGINE 25 MG PO TABS: 50.0000 mg | ORAL_TABLET | Freq: Every day | ORAL | Status: DC

## 2018-03-04 MED ORDER — LAMOTRIGINE 25 MG PO TABS
50.0000 mg | ORAL_TABLET | Freq: Two times a day (BID) | ORAL | Status: DC
  Administered 2018-03-04 – 2018-03-10 (×12): 50 mg via ORAL
  Filled 2018-03-04 (×12): qty 2

## 2018-03-04 MED ORDER — TOPIRAMATE ER 100 MG PO CAP24
100.0000 mg | ORAL_CAPSULE | Freq: Every day | ORAL | Status: DC
  Administered 2018-03-04 – 2018-03-09 (×6): 100 mg via ORAL
  Filled 2018-03-04 (×6): qty 100

## 2018-03-04 MED ORDER — PRAZOSIN HCL 2 MG PO CAPS
2.0000 mg | ORAL_CAPSULE | Freq: Every day | ORAL | Status: DC
  Administered 2018-03-04 – 2018-03-09 (×6): 2 mg via ORAL
  Filled 2018-03-04 (×6): qty 1

## 2018-03-04 MED ORDER — ALBUTEROL SULFATE (2.5 MG/3ML) 0.083% IN NEBU
2.5000 mg | INHALATION_SOLUTION | Freq: Two times a day (BID) | RESPIRATORY_TRACT | Status: DC
  Administered 2018-03-04 – 2018-03-09 (×10): 2.5 mg via RESPIRATORY_TRACT
  Filled 2018-03-04 (×12): qty 3

## 2018-03-04 NOTE — B[facility] Counselor (Signed)
CSW called [facility] to request information regarding services for the patient. CSW was told that the patient was referred to the [facility] Team with [facility] on February 17, 2018. CSW was referred to a team member and left a voicemail to follow up on the referral with a callback number.  Johny Shears, MSW, Theresia Majors, Bridget Hartshorn Clinical Social Worker 03/04/2018 2:38 PM

## 2018-03-04 NOTE — Plan of Care (Signed)
Patient was sitting in the common area earlier watching television with peers with out any issues, upon approach patient complain of depression and anxiety at 6/10, but denies any SI/HI, and no signs of AVH voiced by patient . Patient described feeling of worthlessness and feeling sad , sleep difficulties was described, patient described fewer instances of impulsive behaviors , described intermittent participation in scheduled activities due to her respiratory problems , patient sleep with C-PAP machine at night and had breathing treatment with her medicines. Compliance with her meds. Is good , needs no help with ADLs, support and encouragement is provided and 15 minute rounding is maintained, close circuit monitor is present in the room for her resp. Machine. No distress.   Problem: Education: Goal: Ability to make informed decisions regarding treatment will improve Outcome: Progressing   Problem: Coping: Goal: Coping ability will improve Outcome: Progressing   Problem: Self-Concept: Goal: Ability to disclose and discuss suicidal ideas will improve Outcome: Progressing Goal: Will verbalize positive feelings about self Outcome: Progressing   Problem: Education: Goal: Utilization of techniques to improve thought processes will improve Outcome: Progressing Goal: Knowledge of the prescribed therapeutic regimen will improve Outcome: Progressing   Problem: Coping: Goal: Coping ability will improve Outcome: Progressing Goal: Will verbalize feelings Outcome: Progressing   Problem: Activity: Goal: Will identify at least one activity in which they can participate Outcome: Progressing   Problem: Health Behavior/Discharge Planning: Goal: Identification of resources available to assist in meeting health care needs will improve Outcome: Progressing   Problem: Self-Concept: Goal: Will verbalize positive feelings about self Outcome: Progressing   Problem: Role Relationship: Goal: Ability  to communicate needs accurately will improve Outcome: Progressing Goal: Ability to interact with others will improve Outcome: Progressing   Problem: Safety: Goal: Ability to redirect hostility and anger into socially appropriate behaviors will improve Outcome: Progressing Goal: Ability to remain free from injury will improve Outcome: Progressing

## 2018-03-04 NOTE — Progress Notes (Signed)
Recreation Therapy Notes  Date: 03/03/2018  Time: 9:30 pm   Location: Craft Room   Behavioral response: N/A   Intervention Topic: Communication  Discussion/Intervention: Patient did not attend group.   Clinical Observations/Feedback:  Patient did not attend group.   Marija Calamari LRT/CTRS        Dalynn Jhaveri 03/04/2018 11:46 AM 

## 2018-03-04 NOTE — [facility] Group Notes (Signed)
[facility] Group Notes:  (Nursing/MHT/Case Management/Adjunct)  Date:  03/04/2018  Time:  9:27 PM  Type of Therapy:  Group Therapy  Participation Level:  Did Not Attend   [name] 03/04/2018, 9:27 PM

## 2018-03-04 NOTE — Plan of Care (Signed)
Patient  is alert and responsive. Present in the milieu intermittently ambulating with a steady gait. Complained of hearing voices telling her that she is worthless, stupid and dumb. "I hate hearing these voices everyday, they are so loud." This writer talked with patient at length regarding ways to tune out these voices and replace them them with positive thoughts. Patient stated that she is grateful for her husbands support, "I feel that God sent him to me." Patient encouraged to think of some positive things about herself, patient agreed to think about this. Compliant with medications and meals. Denies SI/HI/AVH. Rates her depression, hopelessness and anxiety at a 7. Milieu remains safe with q 15 minute safety checks.

## 2018-03-04 NOTE — B[facility] Group Notes (Signed)

## 2018-03-04 NOTE — Progress Notes (Signed)
This Clinical research associate was summoned to the dayroom where patient was visiting with her husband. This Clinical research associate had just left out of the dayroom discussing the medication that he brought in per MD request and patient was alert and speaking to this Clinical research associate.  After being summoned to the daycare after leaving, patient was found to be leaning over on her husband. Patient was not responding when being spoken to, no shaking nor jerking movement noted. VS taken and as follows: BP-129/79 Pulse: 80  O2 Sat: 94% RA BS :116. After 3 minutes patient sat up and began speaking to her husband coherently. Patient was also able to stand with stable gait to transfer from chair to wheelchair and transported to her room. Patient alert and oriented x 4 with clear speech. 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 This information will be passed along to the next shift. On call MD (Dr. Jennet Maduro @[phone] ) notified of event.

## 2018-03-04 NOTE — Progress Notes (Signed)
[facility] MD Progress Note  03/04/2018 2:07 PM Danielle [name]  MRN:  [id number] Subjective:  Pt has been sleeping most of the morning. She states that she did try to go to group yesterday and was able to spend time outside but "the voices got bad." She did take prn Haldol and Klonopin which was helpful. She states that she is sleeping slightly better and having less nightmares. She states that she only had one nightmare last night which is a huge improvement for her. She states that she voices still come and go but feels they are slightly becoming more manageable. When they do come (which is unpredictable) they say very negative and hurtful things such as "You're dumb and stupid." She states that it is hard for her to use coping skills during this time but she does try. She states that suicidal thoughts are becoming less in frequency and less intense. She has brighter affect today. She is observed attending group this afternoon. She is organized in her thoughts. She states that she misses her husband and is excited to see him today. She is eating well although she has an upset stomach currently. She is having regular bowel movements and is not constipated.   I spoke with her husband, [name] with her permission at [phone]. 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 He said he was going to bring in the remaining bottle for her to get started on in the hospital. He did not think that she was taking Lamictal. He states that her neurologist wanted her to start this and they ran it by Dr. Lolly Mustache and he said it wasn't a problem. Per notes from recent hospitalization, she was taking this medication. He states that at recent hospitalization they "did not change any medications" and he was frustrated by this. He was tankful for the call.   Principal Problem:  Paranoid schizophrenia (HCC) Diagnosis:   Patient Active Problem List   Diagnosis Date Noted  . Suicidal ideation [R45.851] 02/28/2018  . 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 Frequent falls [R29.6] 06/18/2015  . Tobacco use disorder [F17.200] 06/08/2015  . Hyperammonemia (HCC) [E72.20] 06/08/2015  . Paranoid schizophrenia (HCC) [F20.0]   . Migraine headache [G43.909] 06/04/2012  . Seizure (HCC) [R56.9] 06/04/2012  . Hypothyroidism [E03.9] 06/04/2012  . OSA (obstructive sleep apnea) [G47.33] 08/29/2011  . Post traumatic stress disorder (PTSD) [F43.10] 07/31/2011   Total Time spent with patient: 20 minutes  Past Psychiatric History: See H&P  Past Medical History:  Past Medical History:  Diagnosis Date  . Anemia   . Anxiety   . Bipolar 1 disorder (HCC)   . Depressed   . Headache(784.0)   . Heart murmur   . Hypothyroidism   . PTSD (post-traumatic stress disorder)   . Schizophrenia (HCC)   . Seizures (HCC)   . Shortness of breath     Past Surgical History:  Procedure Laterality Date  . FOOT SURGERY    . LEG SURGERY    . NASAL FRACTURE SURGERY    . TUBAL LIGATION  1996   Family History:  Family History  Adopted: Yes  Problem Relation Age of Onset  . Drug abuse Daughter        heroin addiction   Family Psychiatric  History: see H&P Social History:  Social  History   Substance and Sexual Activity  Alcohol Use No  . Alcohol/week: 0.0 standard drinks     Social History   Substance and Sexual Activity  Drug Use No    Social History   Socioeconomic History  . Marital status: Married    Spouse name: Not on file  . Number of children: 2  . Years of education: Not on file  . Highest education level: Not on file  Occupational History  . Occupation: unemployed    Associate Professor: NOT EMPLOYED  Social Needs  . Financial resource strain: Not on file  . Food insecurity:    Worry: Not on file    Inability: Not on  file  . Transportation needs:    Medical: Not on file    Non-medical: Not on file  Tobacco Use  . Smoking status: Former Smoker    Packs/day: 1.50    Years: 30.00    Pack years: 45.00    Types: Cigarettes    Last attempt to quit: 09/20/2011    Years since quitting: 6.4  . Smokeless tobacco: Current User  Substance and Sexual Activity  . Alcohol use: No    Alcohol/week: 0.0 standard drinks  . Drug use: No  . Sexual activity: Yes    Partners: Male    Birth control/protection: Surgical  Lifestyle  . Physical activity:    Days per week: Not on file    Minutes per session: Not on file  . Stress: Not on file  Relationships  . Social connections:    Talks on phone: Not on file    Gets together: Not on file    Attends religious service: Not on file    Active member of club or organization: Not on file    Attends meetings of clubs or organizations: Not on file    Relationship status: Not on file  Other Topics Concern  . Not on file  Social History Narrative   Pt is adopted. Unsure of family history.   Additional Social History:                         Sleep: Fair  Appetite:  Fair  Current Medications: Current Facility-Administered Medications  Medication Dose Route Frequency Provider Last Rate Last Dose  . albuterol (PROVENTIL) (2.5 MG/3ML) 0.083% nebulizer solution 2.5 mg  2.5 mg Nebulization Q6H PRN Clapacs, John T, MD   2.5 mg at 03/04/18 1337  . alum & mag hydroxide-simeth (MAALOX/MYLANTA) 200-200-20 MG/5ML suspension 30 mL  30 mL Oral Q4H PRN Clapacs, John T, MD      . aspirin EC tablet 81 mg  81 mg Oral Q breakfast Clapacs, Jackquline Denmark, MD   81 mg at 03/04/18 0819  . butalbital-acetaminophen-caffeine (FIORICET, ESGIC) 50-325-40 MG per tablet 1 tablet  1 tablet Oral Q8H PRN McNew, Ileene Hutchinson, MD      . clonazePAM (KLONOPIN) tablet 0.25 mg  0.25 mg Oral QID PRN Haskell Riling, MD   0.25 mg at 03/03/18 1337  . cloZAPine (CLOZARIL) tablet 75 mg  75 mg Oral QHS McNew,  Ileene Hutchinson, MD   75 mg at 03/03/18 2216  . diltiazem (CARDIZEM CD) 24 hr capsule 120 mg  120 mg Oral Daily Clapacs, Jackquline Denmark, MD   120 mg at 03/04/18 0819  . fluticasone furoate-vilanterol (BREO ELLIPTA) 200-25 MCG/INH 1 puff  1 puff Inhalation Daily McNew, Ileene Hutchinson, MD   1 puff at 03/04/18 0819  . folic  acid (FOLVITE) tablet 1 mg  1 mg Oral Daily Clapacs, John T, MD   1 mg at 03/04/18 0819  . haloperidol (HALDOL) tablet 2 mg  2 mg Oral Q6H PRN Haskell Riling, MD   2 mg at 03/04/18 0954  . hydrochlorothiazide (HYDRODIURIL) tablet 12.5 mg  12.5 mg Oral Daily Clapacs, Jackquline Denmark, MD   12.5 mg at 03/04/18 0819  . ibuprofen (ADVIL,MOTRIN) tablet 800 mg  800 mg Oral Q8H PRN Clapacs, Jackquline Denmark, MD   800 mg at 03/03/18 0826  . lamoTRIgine (LAMICTAL) tablet 50 mg  50 mg Oral BID Haskell Riling, MD   50 mg at 03/04/18 0819  . levothyroxine (SYNTHROID, LEVOTHROID) tablet 100 mcg  100 mcg Oral Q0600 Clapacs, Jackquline Denmark, MD   100 mcg at 03/04/18 0618  . lithium carbonate (LITHOBID) CR tablet 300 mg  300 mg Oral Q12H McNew, Ileene Hutchinson, MD   300 mg at 03/04/18 0819  . magnesium hydroxide (MILK OF MAGNESIA) suspension 30 mL  30 mL Oral Daily PRN Clapacs, John T, MD      . mirtazapine (REMERON) tablet 15 mg  15 mg Oral QHS McNew, Ileene Hutchinson, MD   15 mg at 03/03/18 2216  . prazosin (MINIPRESS) capsule 1 mg  1 mg Oral QHS McNew, Ileene Hutchinson, MD   1 mg at 03/03/18 2220  . sulfamethoxazole-trimethoprim (BACTRIM DS,SEPTRA DS) 800-160 MG per tablet 1 tablet  1 tablet Oral Q12H McNew, Ileene Hutchinson, MD   1 tablet at 03/04/18 1610    Lab Results:  Results for orders placed or performed during the hospital encounter of 02/27/18 (from the past 48 hour(s))  Urinalysis, Complete w Microscopic     Status: Abnormal   Collection Time: 03/02/18 10:12 PM  Result Value Ref Range   Color, Urine YELLOW (A) YELLOW   APPearance TURBID (A) CLEAR   Specific Gravity, Urine 1.008 1.005 - 1.030   pH 6.0 5.0 - 8.0   Glucose, UA NEGATIVE NEGATIVE mg/dL   Hgb urine  dipstick SMALL (A) NEGATIVE   Bilirubin Urine NEGATIVE NEGATIVE   Ketones, ur NEGATIVE NEGATIVE mg/dL   Protein, ur NEGATIVE NEGATIVE mg/dL   Nitrite POSITIVE (A) NEGATIVE   Leukocytes, UA LARGE (A) NEGATIVE   RBC / HPF 21-50 0 - 5 RBC/hpf   WBC, UA >50 (H) 0 - 5 WBC/hpf   Bacteria, UA FEW (A) NONE SEEN   Squamous Epithelial / LPF 6-10 0 - 5   WBC Clumps PRESENT     Comment: Performed at [facility], [address], [place], [place] [address]  Urine Culture     Status: Abnormal (Preliminary result)   Collection Time: 03/02/18 10:12 PM  Result Value Ref Range   Specimen Description      URINE, RANDOM Performed at [facility], [address], [place], [place] [address]    Special Requests      NONE Performed at [facility], [address] [place], [place] [address]    Culture >=100,000 COLONIES/mL KLEBSIELLA PNEUMONIAE (A)    Report Status PENDING     Blood Alcohol level:  Lab Results  Component Value Date   [facility] <10 02/27/2018   ETH <5 08/06/2016    Metabolic Disorder Labs: Lab Results  Component Value Date   HGBA1C 5.8 (H) 02/28/2018   MPG 119.76 02/28/2018   MPG 123 (H) 02/05/2012   Lab Results  Component Value Date   PROLACTIN 39.1 (H) 06/08/2015   Lab  Results  Component Value Date   CHOL 242 (H) 02/28/2018   TRIG 728 (H) 02/28/2018   HDL 32 (L) 02/28/2018   CHOLHDL 7.6 02/28/2018   VLDL UNABLE TO CALCULATE IF TRIGLYCERIDE OVER 400 mg/dL 32/44/0102   LDLCALC UNABLE TO CALCULATE IF TRIGLYCERIDE OVER 400 mg/dL 72/53/6644   LDLCALC 034 (H) 06/08/2015    Physical Findings: AIMS: Facial and Oral Movements Muscles of Facial Expression: None, normal Lips and Perioral Area: None, normal Jaw: None, normal Tongue: None, normal,Extremity Movements Upper (arms, wrists, hands, fingers): None, normal Lower (legs, knees, ankles, toes): None, normal, Trunk Movements Neck, shoulders, hips: None, normal, Overall Severity Severity of  abnormal movements (highest score from questions above): None, normal Incapacitation due to abnormal movements: None, normal Patient's awareness of abnormal movements (rate only patient's report): No Awareness, Dental Status Current problems with teeth and/or dentures?: No Does patient usually wear dentures?: No  CIWA:  CIWA-Ar Total: 2 COWS:  COWS Total Score: 2  Musculoskeletal: Strength & Muscle Tone: within normal limits Gait & Station: normal Patient leans: N/A  Psychiatric Specialty Exam: Physical Exam  Nursing note and vitals reviewed.   Review of Systems  All other systems reviewed and are negative.   Blood pressure 137/72, pulse 79, temperature 98.5 F (36.9 C), temperature source Oral, resp. rate 18, height 5\' 9"  (1.753 m), weight 120.2 kg, SpO2 93 %.Body mass index is 39.13 kg/m.  General Appearance: Casual  Eye Contact:  Good  Speech:  Clear and Coherent  Volume:  Normal  Mood:  Depressed  Affect:  Congruent but slightly brighter today  Thought Process:  Coherent and Goal Directed  Orientation:  Full (Time, Place, and Person)  Thought Content:  Hallucinations: Auditory  Suicidal Thoughts:  Yes.  without intent/plan  Homicidal Thoughts:  No  Memory:  Immediate;   Fair  Judgement:  Fair  Insight:  Fair  Psychomotor Activity:  Normal  Concentration:  Concentration: Fair  Recall:  [employer] of Knowledge:  Fair  Language:  Fair  Akathisia:  No      Assets:  Resilience  ADL's:  Intact  Cognition:  WNL  Sleep:  Number of Hours: 5.75     Treatment Plan Summary: 47 yo female admitted due to worsening AH and SI. She continues to have episodes of loud self depreciating voices but are becoming less intrusive and less frequent. She is trying to come out of her room more and attending groups. She is more social with peers. She is on Clozapine. She was sleeping most of the morning. Will hold off on increasing dose tonight due to potential oversedation.    Plan:  Schizophrenia -Continue Clozapine 75 mg qhs. Will likely increase dose tomorrow night. QTc yesterday 355 -Haldol 2 mg prn for AH  PTSD -Continue Remeron 15 mg qhs -Increase Prazosin to 2 mg qhs which was not done last night -Continue Lithium 300 mg BID. 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  However, per records she has been on this medication at 50 mg BID. Will continue dose as she is showing improvements in mood  HTN -Cardizem and HCTZ -Pt had ECHO done 02/15/18  Hypothyroid -Synthroid 100 mcg -TSH normal range  Migraines -prn Fioricet but lower  dose to only 1 tablet q8 hrs  Elevated WBC -UA shows positive nitrite, positive RBC, >50 WBC and few bacteria. Started Bactrim for 7 days -Culture shows >100,000 colonie of klebsiella pneumonia. Report status pending  Dispo -Discharge home when stable. She was apparently referred to ACT team at recent hospitalization. Possibly [facility] in [facility], MD 03/04/2018, 2:07 PM

## 2018-03-04 NOTE — Progress Notes (Signed)
Patient ID: Danielle [name], female   DOB: 01-Nov-1970, 47 y.o.   MRN: [id number]  I was called by nursing staff that the patient, who has a history of pseudoseizures, had a seizure-like episode during visitation hours. No seizures observed but the patient became limp in her husband's arms. VS were stable. The patient has been taking Trokendi XR (extened release Topamax) at home that she believes were helpful in controlling pseudoseizures. Will order Trokendi XR 100 mg tonight.

## 2018-03-05 LAB — URINE CULTURE

## 2018-03-05 MED ORDER — CLOZAPINE 25 MG PO TABS
25.0000 mg | ORAL_TABLET | Freq: Every day | ORAL | Status: DC
  Administered 2018-03-05 – 2018-03-10 (×6): 25 mg via ORAL
  Filled 2018-03-05 (×6): qty 1
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
## 2018-03-05 NOTE — [facility] Group Notes (Signed)
LCSW Group Therapy Note  03/05/2018 1:00 pm  Type of Therapy/Topic:  Group Therapy:  Balance in Life  Participation Level:  Did Not Attend  Description of Group:    This group will address the concept of balance and how it feels and looks when one is unbalanced. Patients will be encouraged to process areas in their lives that are out of balance and identify reasons for remaining unbalanced. Facilitators will guide patients in utilizing problem-solving interventions to address and correct the stressor making their life unbalanced. Understanding and applying boundaries will be explored and addressed for obtaining and maintaining a balanced life. Patients will be encouraged to explore ways to assertively make their unbalanced needs known to significant others in their lives, using other group members and facilitator for support and feedback.  Therapeutic Goals: 1. Patient will identify two or more emotions or situations they have that consume much of in their lives. 2. Patient will identify signs/triggers that life has become out of balance:  3. Patient will identify two ways to set boundaries in order to achieve balance in their lives:  4. Patient will demonstrate ability to communicate their needs through discussion and/or role plays  Summary of Patient Progress:      Therapeutic Modalities:   Cognitive Behavioral Therapy Solution-Focused Therapy Assertiveness Training  Alease Frame, LCSW 03/05/2018 3:03 PM

## 2018-03-05 NOTE — Plan of Care (Signed)
Pt. Denies si/hi, verbally Is able to contract for safety. Pt. Reports she can remain safe while on the unit.     Problem: Self-Concept: Goal: Ability to disclose and discuss suicidal ideas will improve Outcome: Progressing   Problem: Safety: Goal: Ability to remain free from injury will improve Outcome: Progressing

## 2018-03-05 NOTE — Progress Notes (Signed)
D: Patient stated slept good last night .Stated appetite is good and energy level  Is normal. Stated concentration is good . Stated on Depression scale , hopeless and anxiety .( low 0-10 high) Denies suicidal  homicidal ideations  .  No auditory hallucinations  No pain concerns . Appropriate ADL'S. Interacting with peers and staff. Patient working on Scientist, research (medical)  and coping . Denies suicidal ideations . Working on feelings of  self . Compliant  with  medication. Understanding of medication given . No anger outburst  and  no  safety concerns  A: Encourage patient participation with unit programming . Instruction  Given on  Medication , verbalize understanding. R: Voice no other concerns. Staff continue to monitor

## 2018-03-05 NOTE — Plan of Care (Addendum)
Patient found asleep in bed upon my arrival. Patient is visible and somewhat social later in the evening. Attends group. Denies SI/HI. Continues to report anxiety R/T AH, affect congruent. Patient receives nebulizer treatment and C-PAP set up from respiratory. Reports eating and voiding adequately. Denies pain. Compliant with HS medication and staff direction. Q 15 minute checks maintained. Will continue to monitor throughout the shift. @0130 , patient awake, complaining of AH. Given Haldol PRN. Will monitor for efficacy. @0410 , patient awake, complaining of migraine HA 8/10. Given Fioricet. Will monitor for efficacy. Patient slept 6.25 hours. No apparent distress. Compliant with am medication. Will endorse care to oncoming shift.  Problem: Coping: Goal: Coping ability will improve Outcome: Progressing   Problem: Education: Goal: Knowledge of the prescribed therapeutic regimen will improve Outcome: Progressing   Problem: Coping: Goal: Coping ability will improve Outcome: Progressing Goal: Will verbalize feelings Outcome: Progressing   Problem: Role Relationship: Goal: Ability to interact with others will improve Outcome: Progressing

## 2018-03-05 NOTE — Progress Notes (Signed)
D: Pt denies SI/HI, verbally is able to contract for safety. Pt. This evening during assessments denied having auditory or visual hallucinations, but later in the night reported responding to internal stimuli that was disturbing to her and causing anxiety. Pt. During the night given PRN medications for pt. Comfort and safety. Pt. Attends snack, but no group this evening. No behavioral issues to report. Pt. Denies any pain from reported pseudoseizure activity earlier in the day. Pt. Monitored with video surveillance for safety when patient utilizing CPAP. Pt. Anxiety reported as, "7/10" and reports depression, "low".      A: Q x 15 minute observation checks were completed for safety. Patient was provided with education, needs reinforcement.  Patient was given/offered medications per orders. Patient  was encourage to attend groups, participate in unit activities and continue with plan of care. Pt. Chart and plans of care reviewed. Pt. Given support and encouragement.   R: Patient is complaint with medication and unit procedures with direction and encouragement from staff for the most part. Pt. Given extensive high falls and seizure safety education.               Precautionary checks every 15 minutes for safety maintained, room free of safety hazards, patient sustains no injury or falls during this shift. Will endorse care to next shift.

## 2018-03-05 NOTE — Plan of Care (Signed)
Patient working on Scientist, research (medical)  and coping . Denies suicidal ideations . Working on feelings of  self . Compliant  with  medication. Understanding of medication given . No anger outburst  and  no  safety concerns    Problem: Education: Goal: Ability to make informed decisions regarding treatment will improve Outcome: Progressing   Problem: Coping: Goal: Coping ability will improve Outcome: Progressing   Problem: Self-Concept: Goal: Ability to disclose and discuss suicidal ideas will improve Outcome: Progressing Goal: Will verbalize positive feelings about self Outcome: Progressing   Problem: Education: Goal: Utilization of techniques to improve thought processes will improve Outcome: Progressing Goal: Knowledge of the prescribed therapeutic regimen will improve Outcome: Progressing   Problem: Coping: Goal: Coping ability will improve Outcome: Progressing Goal: Will verbalize feelings Outcome: Progressing   Problem: Activity: Goal: Will identify at least one activity in which they can participate Outcome: Progressing   Problem: Health Behavior/Discharge Planning: Goal: Identification of resources available to assist in meeting health care needs will improve Outcome: Progressing   Problem: Self-Concept: Goal: Will verbalize positive feelings about self Outcome: Progressing   Problem: Role Relationship: Goal: Ability to communicate needs accurately will improve Outcome: Progressing Goal: Ability to interact with others will improve Outcome: Progressing   Problem: Safety: Goal: Ability to redirect hostility and anger into socially appropriate behaviors will improve Outcome: Progressing Goal: Ability to remain free from injury will improve Outcome: Progressing

## 2018-03-05 NOTE — Progress Notes (Signed)
Recreation Therapy Notes  Date: 03/05/2018  Time: 9:30 pm   Location: Craft Room   Behavioral response: N/A   Intervention Topic: Emotions  Discussion/Intervention: Patient did not attend group.   Clinical Observations/Feedback:  Patient did not attend group.   Khori Rosevear LRT/CTRS        Julious Langlois 03/05/2018 11:00 AM

## 2018-03-05 NOTE — Progress Notes (Signed)
Patient up at roughly around this time up at the nursing station reporting auditory hallucinations and anxiety that are disturbing to her. Pt. Given support and encouragement as well as offered PRN medications for comfort that the patient was complaint with. Pt. To be monitored per orders.

## 2018-03-06 DIAGNOSIS — R569 Unspecified convulsions: Secondary | ICD-10-CM

## 2018-03-06 DIAGNOSIS — F603 Borderline personality disorder: Secondary | ICD-10-CM

## 2018-03-06 DIAGNOSIS — F445 Conversion disorder with seizures or convulsions: Secondary | ICD-10-CM

## 2018-03-06 MED ORDER — CLOZAPINE 100 MG PO TABS
100.0000 mg | ORAL_TABLET | Freq: Every day | ORAL | Status: DC
  Administered 2018-03-06: 100 mg via ORAL
  Filled 2018-03-06: qty 1
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
## 2018-03-06 NOTE — [facility] Group Notes (Signed)
Feelings Around Relapse 03/06/2018 9:30AM/1PM  Type of Therapy and Topic:  Group Therapy:  Feelings around Relapse and Recovery  Participation Level:  Did Not Attend   Description of Group:    Patients in this group will discuss emotions they experience before and after a relapse. They will process how experiencing these feelings, or avoidance of experiencing them, relates to having a relapse. Facilitator will guide patients to explore emotions they have related to recovery. Patients will be encouraged to process which emotions are more powerful. They will be guided to discuss the emotional reaction significant others in their lives may have to patients' relapse or recovery. Patients will be assisted in exploring ways to respond to the emotions of others without this contributing to a relapse.  Therapeutic Goals: 1. Patient will identify two or more emotions that lead to a relapse for them 2. Patient will identify two emotions that result when they relapse 3. Patient will identify two emotions related to recovery 4. Patient will demonstrate ability to communicate their needs through discussion and/or role plays   Summary of Patient Progress:     Therapeutic Modalities:   Cognitive Behavioral Therapy Solution-Focused Therapy Assertiveness Training Relapse Prevention Therapy   Suzan Slick, LCSW 03/06/2018 2:32 PM

## 2018-03-06 NOTE — Progress Notes (Signed)
Patient alert and oriented,denes SI, HI but has crying spells in which patient states,"The voices are really loud." Patient had two crying spells on this shift, Haldol tablet given with effective results. Patient did not exhibit any self injurious behaviors on unit during shift.

## 2018-03-06 NOTE — Progress Notes (Signed)
D: Pt denies SI/HI, contracts for safety. Pt. Reports during assessments that earlier in the day she was experiencing auditory hallucinations, but currently denies experiencing any. Pt. Denies depression and reports anxiety, "a little". Pt. observed this evening frequently in the milieu watching tv and relaxing. Pt. Affect flat and patient presents disheveled with strong body odor. Pt. Encouraged to attend to hygiene. Pt. Mood is pleasant/anxious and she is cooperative overall.     A: Q x 15 minute observation checks were completed for safety. Patient was provided with education, but needs reinforcement.  Patient was given/offered medications per orders. Patient  was encourage to attend groups, participate in unit activities and continue with plan of care. Pt. Chart and plans of care reviewed. Pt. Given support and encouragement.   R: Patient is complaint with medication and unit procedures with encouragement and direction from staff. Pt. CPAP utilized for sleep per orders with video surveillance for safety. RT called and came to unit to administer breathing treatments.             Precautionary checks every 15 minutes for safety maintained, room free of safety hazards, patient sustains no injury or falls during this shift. Will endorse care to next shift.  Patient Observation  Patient at roughly 0150am came up to the nursing station reporting anxiety and agitations about, "the nightmares" she is experiencing. Pt. Request prn medications for comfort. Pt. Given PRN medications to reduce agitations and anxieties. Patient monitoring continued per MD orders.

## 2018-03-06 NOTE — Plan of Care (Signed)
Patient is alert and oriented X 4, denies SI, HI but positive for hearing voices. Patient states voices are telling her, "I'm not fit to live, and I should not be here." Patient sates she feels safe in the hospital and verbally contracts for safety. Patient is pleasant, attends groups, rates pain 0/10. Patient is capable of expressing herself to staff, appropriate on the unit with staff and peers. 15 minutes safety checks will continue, no self injurious behaviors noted. Problem: Coping: Goal: Coping ability will improve Outcome: Progressing Goal: Will verbalize feelings Outcome: Progressing   Problem: Activity: Goal: Will identify at least one activity in which they can participate Outcome: Progressing   Problem: Self-Concept: Goal: Will verbalize positive feelings about self Outcome: Progressing

## 2018-03-06 NOTE — Tx Team (Signed)
Interdisciplinary Treatment and Diagnostic Plan Update  03/06/2018 Time of Session: 8:30am Danielle [name] MRN: [id number]  Principal Diagnosis: Paranoid schizophrenia (HCC)  Secondary Diagnoses: Principal Problem:   Paranoid schizophrenia (HCC) Active Problems:   Post traumatic stress disorder (PTSD)   Hypothyroidism   Severe major depression, single episode, with psychotic features (HCC)   Suicidal ideation   Borderline personality disorder (HCC)   Pseudoseizures   Current Medications:  Current Facility-Administered Medications  Medication Dose Route Frequency Provider Last Rate Last Dose  . albuterol (PROVENTIL) (2.5 MG/3ML) 0.083% nebulizer solution 2.5 mg  2.5 mg Nebulization Q6H PRN Clapacs, John T, MD   2.5 mg at 03/04/18 1337  . albuterol (PROVENTIL) (2.5 MG/3ML) 0.083% nebulizer solution 2.5 mg  2.5 mg Nebulization BID McNew, Ileene Hutchinson, MD   2.5 mg at 03/06/18 0836  . alum & mag hydroxide-simeth (MAALOX/MYLANTA) 200-200-20 MG/5ML suspension 30 mL  30 mL Oral Q4H PRN Clapacs, John T, MD      . aspirin EC tablet 81 mg  81 mg Oral Q breakfast Clapacs, Jackquline Denmark, MD   81 mg at 03/06/18 0756  . butalbital-acetaminophen-caffeine (FIORICET, ESGIC) 50-325-40 MG per tablet 1 tablet  1 tablet Oral Q8H PRN Haskell Riling, MD   1 tablet at 03/06/18 0408  . clonazePAM (KLONOPIN) tablet 0.25 mg  0.25 mg Oral QID PRN Haskell Riling, MD   0.25 mg at 03/05/18 0038  . cloZAPine (CLOZARIL) tablet 100 mg  100 mg Oral QHS McNew, Holly R, MD      . cloZAPine (CLOZARIL) tablet 25 mg  25 mg Oral Daily McNew, Ileene Hutchinson, MD   25 mg at 03/06/18 0756  . diltiazem (CARDIZEM CD) 24 hr capsule 120 mg  120 mg Oral Daily Clapacs, Jackquline Denmark, MD   120 mg at 03/06/18 0756  . fluticasone furoate-vilanterol (BREO ELLIPTA) 200-25 MCG/INH 1 puff  1 puff Inhalation Daily McNew, Ileene Hutchinson, MD   1 puff at 03/06/18 0759  . folic acid (FOLVITE) tablet 1 mg  1 mg Oral Daily Clapacs, John T, MD   1 mg at 03/06/18 0756  . haloperidol  (HALDOL) tablet 2 mg  2 mg Oral Q6H PRN Haskell Riling, MD   2 mg at 03/06/18 0134  . hydrochlorothiazide (HYDRODIURIL) tablet 12.5 mg  12.5 mg Oral Daily Clapacs, John T, MD   12.5 mg at 03/06/18 0756  . ibuprofen (ADVIL,MOTRIN) tablet 800 mg  800 mg Oral Q8H PRN Clapacs, Jackquline Denmark, MD   800 mg at 03/03/18 0826  . lamoTRIgine (LAMICTAL) tablet 50 mg  50 mg Oral BID Haskell Riling, MD   50 mg at 03/06/18 0756  . levothyroxine (SYNTHROID, LEVOTHROID) tablet 100 mcg  100 mcg Oral Q0600 Clapacs, Jackquline Denmark, MD   100 mcg at 03/06/18 0610  . lithium carbonate (LITHOBID) CR tablet 300 mg  300 mg Oral Q12H McNew, Ileene Hutchinson, MD   300 mg at 03/06/18 0756  . magnesium hydroxide (MILK OF MAGNESIA) suspension 30 mL  30 mL Oral Daily PRN Clapacs, John T, MD      . mirtazapine (REMERON) tablet 15 mg  15 mg Oral QHS McNew, Ileene Hutchinson, MD   15 mg at 03/05/18 2103  . prazosin (MINIPRESS) capsule 2 mg  2 mg Oral QHS McNew, Ileene Hutchinson, MD   2 mg at 03/05/18 2103  . sulfamethoxazole-trimethoprim (BACTRIM DS,SEPTRA DS) 800-160 MG per tablet 1 tablet  1 tablet Oral Q12H McNew, Ileene Hutchinson, MD  1 tablet at 03/05/18 2136  . Topiramate ER (TROKENDI XR) CP24 100 mg  100 mg Oral QHS Pucilowska, Jolanta B, MD   100 mg at 03/05/18 2222   PTA Medications: Medications Prior to Admission  Medication Sig Dispense Refill Last Dose  . lamoTRIgine (LAMICTAL) 25 MG tablet Take 50 mg by mouth 2 (two) times daily.     Marland Kitchen acetaminophen (TYLENOL 8 HOUR) 650 MG CR tablet Take 1 tablet (650 mg total) by mouth every 8 (eight) hours as needed for pain. (Patient not taking: Reported on 02/27/2018) 30 tablet 0 Not Taking at Unknown time  . aspirin EC 81 MG tablet Take 1 tablet (81 mg total) by mouth daily with breakfast. (Patient not taking: Reported on 02/27/2018) 30 tablet 0 Not Taking at Unknown time  . clonazePAM (KLONOPIN) 0.5 MG tablet Take 1 tablet (0.5 mg total) by mouth 4 (four) times daily as needed for anxiety. 120 tablet 1 Taking  . diltiazem  (DILACOR XR) 120 MG 24 hr capsule Take 120 mg by mouth daily.   Taking  . eletriptan (RELPAX) 40 MG tablet TAKE 1 TAB AT ONSET OF HEADACHES. MAY REPEAT IN 2 HOURS.MAX 2/24 HRS. FOR 30 DAYS  6 Taking  . folic acid (FOLVITE) 1 MG tablet Take 1 tablet (1 mg total) by mouth daily. 30 tablet 0 Taking  . hydrochlorothiazide (HYDRODIURIL) 12.5 MG tablet TAKE ONE TABLET (12.5 MG DOSE) BY MOUTH DAILY. TAKE ON TABLET BY MOUTH ONCE A DAY IN THE MORNING.  0 Taking  . ibuprofen (ADVIL,MOTRIN) 800 MG tablet Take 800 mg by mouth every 8 (eight) hours as needed (pain).   Taking  . levothyroxine (SYNTHROID, LEVOTHROID) 100 MCG tablet TAKE ONE TABLET (100 MCG DOSE) BY MOUTH DAILY.  5 Taking  . lithium carbonate (ESKALITH) 450 MG CR tablet Take 1 tablet (450 mg total) by mouth daily. 90 tablet 0 Taking  . loxapine (LOXITANE) 50 MG capsule Take 1 capsule (50 mg total) by mouth 2 (two) times daily. for acute psychosis 180 capsule 0   . PARoxetine (PAXIL-CR) 37.5 MG 24 hr tablet Take 1 tablet (37.5 mg total) by mouth every morning. 90 tablet 0 Taking  . POTASSIUM PO Take by mouth.   Taking  . prazosin (MINIPRESS) 2 MG capsule Take 1 capsule (2 mg total) by mouth at bedtime. 90 capsule 0 Taking  . TROKENDI XR 100 MG CP24 Take 1 tablet by mouth daily.  3 Taking  . TROKENDI XR 200 MG CP24 Take 1 tablet by mouth daily.  3 Taking  . Vitamin D, Ergocalciferol, (DRISDOL) 50000 units CAPS capsule Take 50,000 Units by mouth every 7 (seven) days. saturday   Taking    Patient Stressors: Financial difficulties Medication change or noncompliance Substance abuse  Patient Strengths: Capable of independent living Motivation for treatment/growth Supportive family/friends  Treatment Modalities: Medication Management, Group therapy, Case management,  1 to 1 session with clinician, Psychoeducation, Recreational therapy.   Physician Treatment Plan for Primary Diagnosis: Paranoid schizophrenia (HCC) Long Term Goal(s): Improvement  in symptoms so as ready for discharge Improvement in symptoms so as ready for discharge   Short Term Goals: Ability to verbalize feelings will improve Ability to disclose and discuss suicidal ideas Ability to maintain clinical measurements within normal limits will improve Compliance with prescribed medications will improve  Medication Management: Evaluate patient's response, side effects, and tolerance of medication regimen.  Therapeutic Interventions: 1 to 1 sessions, Unit Group sessions and Medication administration.  Evaluation of Outcomes: Progressing  Physician Treatment Plan for Secondary Diagnosis: Principal Problem:   Paranoid schizophrenia (HCC) Active Problems:   Post traumatic stress disorder (PTSD)   Hypothyroidism   Severe major depression, single episode, with psychotic features (HCC)   Suicidal ideation   Borderline personality disorder (HCC)   Pseudoseizures  Long Term Goal(s): Improvement in symptoms so as ready for discharge Improvement in symptoms so as ready for discharge   Short Term Goals: Ability to verbalize feelings will improve Ability to disclose and discuss suicidal ideas Ability to maintain clinical measurements within normal limits will improve Compliance with prescribed medications will improve     Medication Management: Evaluate patient's response, side effects, and tolerance of medication regimen.  Therapeutic Interventions: 1 to 1 sessions, Unit Group sessions and Medication administration.  Evaluation of Outcomes: Progressing   RN Treatment Plan for Primary Diagnosis: Paranoid schizophrenia (HCC) Long Term Goal(s): Knowledge of disease and therapeutic regimen to maintain health will improve  Short Term Goals: Ability to verbalize feelings will improve, Ability to identify and develop effective coping behaviors will improve and Compliance with prescribed medications will improve  Medication Management: RN will administer medications as  ordered by provider, will assess and evaluate patient's response and provide education to patient for prescribed medication. RN will report any adverse and/or side effects to prescribing provider.  Therapeutic Interventions: 1 on 1 counseling sessions, Psychoeducation, Medication administration, Evaluate responses to treatment, Monitor vital signs and CBGs as ordered, Perform/monitor CIWA, COWS, AIMS and Fall Risk screenings as ordered, Perform wound care treatments as ordered.  Evaluation of Outcomes: Progressing   LCSW Treatment Plan for Primary Diagnosis: Paranoid schizophrenia (HCC) Long Term Goal(s): Safe transition to appropriate next level of care at discharge, Engage patient in therapeutic group addressing interpersonal concerns.  Short Term Goals: Engage patient in aftercare planning with referrals and resources, Increase ability to appropriately verbalize feelings, Increase emotional regulation, Identify triggers associated with mental health/substance abuse issues and Increase skills for wellness and recovery  Therapeutic Interventions: Assess for all discharge needs, 1 to 1 time with Social worker, Explore available resources and support systems, Assess for adequacy in community support network, Educate family and significant other(s) on suicide prevention, Complete Psychosocial Assessment, Interpersonal group therapy.  Evaluation of Outcomes: Progressing   Progress in Treatment: Attending groups: Yes. Participating in groups: Yes. Taking medication as prescribed: Yes. Toleration medication: Yes. Family/Significant other contact made: Yes, individual(s) contacted:  Lerry Liner, husband Patient understands diagnosis: Yes. Discussing patient identified problems/goals with staff: Yes. Medical problems stabilized or resolved: Yes. Denies suicidal/homicidal ideation: Yes. Issues/concerns per patient self-inventory: No.  Other: NA  New problem(s) identified: Yes, Describe:  "To  manage my voices, find outlets in my area where I can be more social"  New Short Term/Long Term Goal(s):"To manage my voices, find outlet in my area where I can be more social"  Patient Goals:  "To manage my voices, find outlet in my area where I can be more social"  Discharge Plan or Barriers: Dx home and follow up with outpatient treatment  Reason for Continuation of Hospitalization: Medication stabilization  Estimated Length of Stay: 3-5 days  Recreational Therapy: Patient Stressors: Death Patient Goal: Patient will identify 3 benefits of self-care within 5 recreation therapy group sessions  Attendees: Patient: 03/06/2018 11:24 AM  Physician: Dr. Johnella Moloney 03/06/2018 11:24 AM  Nursing: Milas Hock, RN 03/06/2018 11:24 AM  RN Care Manager: 03/06/2018 11:24 AM  Social Worker: Johny Shears, LCSWA 03/06/2018 11:24 AM  Recreational Therapist:  03/06/2018 11:24 AM  Other: Lowella Dandy, LCSW 03/06/2018 11:24 AM  Other: Huey Romans, LCSW 03/06/2018 11:24 AM  Other: 03/06/2018 11:24 AM    Scribe for Treatment Team: Suzan Slick, LCSW 03/06/2018 11:24 AM

## 2018-03-06 NOTE — Plan of Care (Signed)
Pt. Denies SI/HI, verbally able to contract for safety. Pt. Reports she can remain safe while on the unit. Pt. Interactions with staff and peers appropriate.    Problem: Self-Concept: Goal: Ability to disclose and discuss suicidal ideas will improve Outcome: Progressing   Problem: Role Relationship: Goal: Ability to interact with others will improve Outcome: Progressing   Problem: Safety: Goal: Ability to remain free from injury will improve Outcome: Progressing

## 2018-03-06 NOTE — Progress Notes (Signed)
Recreation Therapy Notes  Date: 03/06/2018  Time: 9:30 am  Location: Craft Room  Behavioral response: Appropriate   Intervention Topic: Team work  Discussion/Intervention:  Group content on today was focused on teamwork. The group identified what teamwork is. Individuals described who is a part of their team. Patients expressed why they thought teamwork is important. The group stated reasons why they thought it was easier to work with a smaller/larger team. Individuals discussed some positives and negatives of working with a team. Patients gave examples of past experiences they had while working with a team. The group participated in the intervention "What is That", where patients were given a chance to point out qualities, they look for in a team mate and were able to work in teams with each other. Clinical Observations/Feedback:  Patient came to group late due to unknown reasons. Individual was social with peers and staff while participating in the intervention.  Andron Marrazzo LRT/CTRS         Jenne Sellinger 03/06/2018 11:20 AM 

## 2018-03-07 LAB — CBC WITH DIFFERENTIAL/PLATELET
ABS IMMATURE GRANULOCYTES: 0.16 10*3/uL — AB (ref 0.00–0.07)
BASOS PCT: 1 %
Basophils Absolute: 0.1 10*3/uL (ref 0.0–0.1)
EOS PCT: 2 %
Eosinophils Absolute: 0.2 10*3/uL (ref 0.0–0.5)
HCT: 38.9 % (ref 36.0–46.0)
Hemoglobin: 12.3 g/dL (ref 12.0–15.0)
Immature Granulocytes: 2 %
LYMPHS PCT: 15 %
Lymphs Abs: 1.6 10*3/uL (ref 0.7–4.0)
MCH: 31.4 pg (ref 26.0–34.0)
MCHC: 31.6 g/dL (ref 30.0–36.0)
MCV: 99.2 fL (ref 80.0–100.0)
MONO ABS: 0.5 10*3/uL (ref 0.1–1.0)
MONOS PCT: 4 %
NEUTROS ABS: 8.3 10*3/uL — AB (ref 1.7–7.7)
Neutrophils Relative %: 76 %
PLATELETS: 276 10*3/uL (ref 150–400)
RBC: 3.92 MIL/uL (ref 3.87–5.11)
RDW: 13.8 % (ref 11.5–15.5)
WBC: 10.8 10*3/uL — AB (ref 4.0–10.5)
nRBC: 0 % (ref 0.0–0.2)

## 2018-03-07 LAB — LITHIUM LEVEL: LITHIUM LVL: 0.67 mmol/L (ref 0.60–1.20)

## 2018-03-07 MED ORDER — CLOZAPINE 25 MG PO TABS
125.0000 mg | ORAL_TABLET | Freq: Every day | ORAL | Status: DC
  Administered 2018-03-07 – 2018-03-09 (×3): 125 mg via ORAL
  Filled 2018-03-07 (×3): qty 1

## 2018-03-07 NOTE — Progress Notes (Signed)
Clozapine monitoring Consult   47 yo female ordered clozapine for PMH of Schizophrenia.  02/28/2018  ANC [id number] 03/07/2018  ANC [id number]  Information entered into Clozapine registry and pt eligible to receive clozapine  Next labs due in a week - ordered for 03/14/2018  Pharmacy will continue to follow.   Gardner Candle, PharmD, BCPS Clinical Pharmacist 03/07/2018 9:35 AM

## 2018-03-07 NOTE — Plan of Care (Signed)
Patient is alert and oriented x 4; denies SI/HI but positive for auditory hallucinations. Patient states she slept really well; denies pain; appropriate demeanor and interactions on the unit with staff and peers. Patient exhibits no self injurious behaviors; has shower supplies available; express no concerns at this moment. Safety checks Q 15 minutes to continue. Problem: Education: Goal: Ability to make informed decisions regarding treatment will improve Outcome: Progressing   Problem: Coping: Goal: Coping ability will improve Outcome: Progressing   Problem: Self-Concept: Goal: Ability to disclose and discuss suicidal ideas will improve Outcome: Progressing Goal: Will verbalize positive feelings about self Outcome: Progressing   Problem: Education: Goal: Utilization of techniques to improve thought processes will improve Outcome: Progressing Goal: Knowledge of the prescribed therapeutic regimen will improve Outcome: Progressing

## 2018-03-07 NOTE — [facility] Group Notes (Signed)
[facility] Group Notes:  (Nursing/MHT/Case Management/Adjunct)  Date:  03/07/2018  Time:  9:33 PM  Type of Therapy:    Participation   Participation Quality:  Appropriate and Can in late  [name] 03/07/2018, 9:33 PM

## 2018-03-07 NOTE — Progress Notes (Signed)
[facility] MD Progress Note  03/07/2018 12:54 PM MINNETTA SANDORA  MRN:  [id number]  Subjective:    [name] reports improvement. She had only two episodes of hallucinations and they are slightly less troubling. No thougts of suicide. She tolerates medications well and has no somatic complaints. No seizure-like episodes since we restarted fancy Topamax. She has very high hopes for Clozapine and insists that we increase dose tonight.  Principal Problem: Paranoid schizophrenia (HCC) Diagnosis:   Patient Active Problem List   Diagnosis Date Noted  . Borderline personality disorder (HCC) [F60.3] 03/06/2018  . 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 Suicidal ideation [R45.851] 02/28/2018  . 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 Frequent falls [R29.6] 06/18/2015  . Tobacco use disorder [F17.200] 06/08/2015  . Hyperammonemia (HCC) [E72.20] 06/08/2015  . Paranoid schizophrenia (HCC) [F20.0]   . Migraine headache [G43.909] 06/04/2012  . Seizure (HCC) [R56.9] 06/04/2012  . Hypothyroidism [E03.9] 06/04/2012  . OSA (obstructive sleep apnea) [G47.33] 08/29/2011  . Post traumatic stress disorder (PTSD) [F43.10] 07/31/2011   Total Time spent with patient: 20 minutes  Past Psychiatric History: schiizophrenia  Past Medical History:  Past Medical History:  Diagnosis Date  . Anemia   . Anxiety   . Bipolar 1 disorder (HCC)   . Depressed   . Headache(784.0)   . Heart murmur   . Hypothyroidism   . PTSD (post-traumatic stress disorder)   . Schizophrenia (HCC)   . Seizures (HCC)   . Shortness of breath     Past Surgical History:  Procedure Laterality Date  . FOOT SURGERY    . LEG SURGERY    . NASAL FRACTURE SURGERY    . TUBAL LIGATION  1996   Family History:  Family History  Adopted: Yes  Problem Relation Age of Onset  . Drug abuse Daughter        heroin addiction   Family Psychiatric  History: see H&P Social History:  Social History    Substance and Sexual Activity  Alcohol Use No  . Alcohol/week: 0.0 standard drinks     Social History   Substance and Sexual Activity  Drug Use No    Social History   Socioeconomic History  . Marital status: Married    Spouse name: Not on file  . Number of children: 2  . Years of education: Not on file  . Highest education level: Not on file  Occupational History  . Occupation: unemployed    Associate Professor: NOT EMPLOYED  Social Needs  . Financial resource strain: Not on file  . Food insecurity:    Worry: Not on file    Inability: Not on file  . Transportation needs:    Medical: Not on file    Non-medical: Not on file  Tobacco Use  . Smoking status: Former Smoker    Packs/day: 1.50    Years: 30.00    Pack years: 45.00    Types: Cigarettes    Last attempt to quit: 09/20/2011    Years since quitting: 6.4  . Smokeless tobacco: Current User  Substance and Sexual Activity  . Alcohol use: No    Alcohol/week: 0.0 standard drinks  . Drug use: No  . Sexual activity: Yes    Partners: Male    Birth control/protection: Surgical  Lifestyle  . Physical activity:    Days per week: Not on file    Minutes per session: Not on file  . Stress: Not on file  Relationships  .  Social connections:    Talks on phone: Not on file    Gets together: Not on file    Attends religious service: Not on file    Active member of club or organization: Not on file    Attends meetings of clubs or organizations: Not on file    Relationship status: Not on file  Other Topics Concern  . Not on file  Social History Narrative   Pt is adopted. Unsure of family history.   Additional Social History:                         Sleep: Fair  Appetite:  Fair  Current Medications: Current Facility-Administered Medications  Medication Dose Route Frequency Provider Last Rate Last Dose  . albuterol (PROVENTIL) (2.5 MG/3ML) 0.083% nebulizer solution 2.5 mg  2.5 mg Nebulization Q6H PRN Clapacs, John  T, MD   2.5 mg at 03/04/18 1337  . albuterol (PROVENTIL) (2.5 MG/3ML) 0.083% nebulizer solution 2.5 mg  2.5 mg Nebulization BID McNew, Ileene Hutchinson, MD   2.5 mg at 03/07/18 1008  . alum & mag hydroxide-simeth (MAALOX/MYLANTA) 200-200-20 MG/5ML suspension 30 mL  30 mL Oral Q4H PRN Clapacs, John T, MD      . aspirin EC tablet 81 mg  81 mg Oral Q breakfast Clapacs, John T, MD   81 mg at 03/07/18 0830  . butalbital-acetaminophen-caffeine (FIORICET, ESGIC) 50-325-40 MG per tablet 1 tablet  1 tablet Oral Q8H PRN Haskell Riling, MD   1 tablet at 03/06/18 0408  . clonazePAM (KLONOPIN) tablet 0.25 mg  0.25 mg Oral QID PRN Haskell Riling, MD   0.25 mg at 03/07/18 0150  . cloZAPine (CLOZARIL) tablet 125 mg  125 mg Oral QHS Jex Strausbaugh B, MD      . cloZAPine (CLOZARIL) tablet 25 mg  25 mg Oral Daily McNew, Ileene Hutchinson, MD   25 mg at 03/07/18 0830  . diltiazem (CARDIZEM CD) 24 hr capsule 120 mg  120 mg Oral Daily Clapacs, John T, MD   120 mg at 03/07/18 0830  . fluticasone furoate-vilanterol (BREO ELLIPTA) 200-25 MCG/INH 1 puff  1 puff Inhalation Daily McNew, Ileene Hutchinson, MD   1 puff at 03/07/18 0831  . folic acid (FOLVITE) tablet 1 mg  1 mg Oral Daily Clapacs, John T, MD   1 mg at 03/07/18 0830  . haloperidol (HALDOL) tablet 2 mg  2 mg Oral Q6H PRN Haskell Riling, MD   2 mg at 03/07/18 0150  . hydrochlorothiazide (HYDRODIURIL) tablet 12.5 mg  12.5 mg Oral Daily Clapacs, John T, MD   12.5 mg at 03/07/18 0830  . ibuprofen (ADVIL,MOTRIN) tablet 800 mg  800 mg Oral Q8H PRN Clapacs, Jackquline Denmark, MD   800 mg at 03/03/18 0826  . lamoTRIgine (LAMICTAL) tablet 50 mg  50 mg Oral BID Haskell Riling, MD   50 mg at 03/07/18 0829  . levothyroxine (SYNTHROID, LEVOTHROID) tablet 100 mcg  100 mcg Oral Q0600 Clapacs, Jackquline Denmark, MD   100 mcg at 03/07/18 0605  . lithium carbonate (LITHOBID) CR tablet 300 mg  300 mg Oral Q12H McNew, Ileene Hutchinson, MD   300 mg at 03/07/18 0830  . magnesium hydroxide (MILK OF MAGNESIA) suspension 30 mL  30 mL Oral  Daily PRN Clapacs, John T, MD      . mirtazapine (REMERON) tablet 15 mg  15 mg Oral QHS McNew, Ileene Hutchinson, MD   15 mg at 03/06/18  2133  . prazosin (MINIPRESS) capsule 2 mg  2 mg Oral QHS McNew, Ileene Hutchinson, MD   2 mg at 03/06/18 2133  . sulfamethoxazole-trimethoprim (BACTRIM DS,SEPTRA DS) 800-160 MG per tablet 1 tablet  1 tablet Oral Q12H McNew, Ileene Hutchinson, MD   1 tablet at 03/07/18 0830  . Topiramate ER (TROKENDI XR) CP24 100 mg  100 mg Oral QHS Dechelle Attaway B, MD   100 mg at 03/06/18 2133    Lab Results:  Results for orders placed or performed during the hospital encounter of 02/27/18 (from the past 48 hour(s))  CBC with Differential/Platelet     Status: Abnormal   Collection Time: 03/07/18  7:10 AM  Result Value Ref Range   WBC 10.8 (H) 4.0 - 10.5 K/uL   RBC 3.92 3.87 - 5.11 MIL/uL   Hemoglobin 12.3 12.0 - 15.0 g/dL   HCT 96.0 45.4 - 09.8 %   MCV 99.2 80.0 - 100.0 fL   MCH 31.4 26.0 - 34.0 pg   MCHC 31.6 30.0 - 36.0 g/dL   RDW 11.9 14.7 - 82.9 %   Platelets 276 150 - 400 K/uL   nRBC 0.0 0.0 - 0.2 %   Neutrophils Relative % 76 %   Neutro Abs 8.3 (H) 1.7 - 7.7 K/uL   Lymphocytes Relative 15 %   Lymphs Abs 1.6 0.7 - 4.0 K/uL   Monocytes Relative 4 %   Monocytes Absolute 0.5 0.1 - 1.0 K/uL   Eosinophils Relative 2 %   Eosinophils Absolute 0.2 0.0 - 0.5 K/uL   Basophils Relative 1 %   Basophils Absolute 0.1 0.0 - 0.1 K/uL   Immature Granulocytes 2 %   Abs Immature Granulocytes 0.16 (H) 0.00 - 0.07 K/uL    Comment: Performed at [facility], [address] [place], [place] [address]  Lithium level     Status: None   Collection Time: 03/07/18  7:10 AM  Result Value Ref Range   Lithium Lvl 0.67 0.60 - 1.20 mmol/L    Comment: Performed at [facility], [address] [place], [place] [address]    Blood Alcohol level:  Lab Results  Component Value Date   [facility] <10 02/27/2018   ETH <5 08/06/2016    Metabolic Disorder Labs: Lab Results  Component Value Date    HGBA1C 5.8 (H) 02/28/2018   MPG 119.76 02/28/2018   MPG 123 (H) 02/05/2012   Lab Results  Component Value Date   PROLACTIN 39.1 (H) 06/08/2015   Lab Results  Component Value Date   CHOL 242 (H) 02/28/2018   TRIG 728 (H) 02/28/2018   HDL 32 (L) 02/28/2018   CHOLHDL 7.6 02/28/2018   VLDL UNABLE TO CALCULATE IF TRIGLYCERIDE OVER 400 mg/dL 84/69/6295   LDLCALC UNABLE TO CALCULATE IF TRIGLYCERIDE OVER 400 mg/dL 28/41/3244   LDLCALC 010 (H) 06/08/2015    Physical Findings: AIMS: Facial and Oral Movements Muscles of Facial Expression: None, normal Lips and Perioral Area: None, normal Jaw: None, normal Tongue: None, normal,Extremity Movements Upper (arms, wrists, hands, fingers): None, normal Lower (legs, knees, ankles, toes): None, normal, Trunk Movements Neck, shoulders, hips: None, normal, Overall Severity Severity of abnormal movements (highest score from questions above): None, normal Incapacitation due to abnormal movements: None, normal Patient's awareness of abnormal movements (rate only patient's report): No Awareness, Dental Status Current problems with teeth and/or dentures?: No Does patient usually wear dentures?: No  CIWA:  CIWA-Ar Total: 2 COWS:  COWS Total Score: 2  Musculoskeletal: Strength &  Muscle Tone: within normal limits Gait & Station: normal Patient leans: N/A  Psychiatric Specialty Exam: Physical Exam  Nursing note and vitals reviewed. Psychiatric: Thought content normal. Her mood appears anxious. Her speech is rapid and/or pressured. She is actively hallucinating. Cognition and memory are normal. She expresses impulsivity.    Review of Systems  Neurological: Positive for seizures.  Psychiatric/Behavioral: Positive for depression and hallucinations.  All other systems reviewed and are negative.   Blood pressure 113/71, pulse 92, temperature 98.1 F (36.7 C), temperature source Oral, resp. rate 16, height 5\' 9"  (1.753 m), weight 120.2 kg, SpO2 96  %.Body mass index is 39.13 kg/m.  General Appearance: Casual  Eye Contact:  Good  Speech:  Pressured and Slurred  Volume:  Normal  Mood:  Depressed  Affect:  Appropriate  Thought Process:  Goal Directed and Descriptions of Associations: Intact  Orientation:  Full (Time, Place, and Person)  Thought Content:  Hallucinations: Auditory  Suicidal Thoughts:  No  Homicidal Thoughts:  No  Memory:  Immediate;   Fair Recent;   Fair Remote;   Fair  Judgement:  Poor  Insight:  Shallow  Psychomotor Activity:  Decreased  Concentration:  Concentration: Fair and Attention Span: Fair  Recall:  [employer] of Knowledge:  Fair  Language:  Fair  Akathisia:  No  Handed:  Right  AIMS (if indicated):     Assets:  Communication Skills Desire for Improvement Financial Resources/Insurance Housing Physical Health Resilience Social Support  ADL's:  Intact  Cognition:  WNL  Sleep:  Number of Hours: 7.15     Treatment Plan Summary: Daily contact with patient to assess and evaluate symptoms and progress in treatment and Medication management   47 yo female admitted due to [facility] and SI. She continues to have bouts of AH through the day. I continue to strongly feel these are self depreciating self thoughts related to past trauma and borderline personality disorder, rather than a psychotic disorder.  She talks about how lonely she is at home and her husband works all the time. It appears she has "learned" behaviors to help get her the attention she craves such as hearing voices, suicidal thoughts and gestures and history of nonepileptic seizures. These symptosm have gained her significant attention from her husband and her providerse. Hospitalization is reinforcing to her because she does get a lot of attention with these "voices." Will try to start talking to her about discharge planning by early next week as the longer she is hospitalized the more dependant and reinforcing hospitalization will become. She does  have future oriented thinking such as going on a date with her husband, getting ACT team and looking forward to this.  She is very organized in her thoughts. She has no delay in her responses despite "very loud voices." She admits to having very low self esteem and these voices are congruent with that. She has bright affect.   Plan:  Schizophrenia -Clozapine 25 mg in AM, increase to 125 mg nightly  -haldol prn  PTSD -Continue Remeron 15 mg qhs -Continue Prazosin 2 mg qhs -Continue Lithium 300 mg BID, Lithioum level 0.67 on 11/2  Anxiety -Continue Klonopin 0.25 mg QID prn  Pseudoseizures -She was started on Trokendi XR which her husband brought in -Continue Lamictal 50 mg BID  HTN -Cardizem and HCTZ -Pt had ECHO done 02/15/18  Hypothyroid -Synthroid 100 mcg -TSH normal range  Migraines -prn Fioricetbut lower dose to only 1 tablet q8 hrs  Elevated WBC -  UA shows positive nitrite, positive RBC, >50 WBC and few bacteria.Started Bactrim for 7 days -Cultureshows >100,000 colonie of klebsiella pneumonia. Sensitive to Bactrim  Dispo -Discharge home when stable. She was apparently referred to ACT team at recent hospitalization. Possibly [facility] in [facility]  Kristine Linea, MD 03/07/2018, 12:54 PM

## 2018-03-08 LAB — LITHIUM LEVEL: Lithium Lvl: 0.59 mmol/L — ABNORMAL LOW (ref 0.60–1.20)

## 2018-03-08 NOTE — Plan of Care (Signed)
  Problem: Education: Goal: Ability to make informed decisions regarding treatment will improve Outcome: Progressing   Problem: Self-Concept: Goal: Ability to disclose and discuss suicidal ideas will improve Outcome: Progressing   Problem: Education: Goal: Utilization of techniques to improve thought processes will improve Outcome: Progressing Goal: Knowledge of the prescribed therapeutic regimen will improve Outcome: Progressing   Problem: Coping: Goal: Coping ability will improve Outcome: Progressing Goal: Will verbalize feelings Outcome: Progressing

## 2018-03-08 NOTE — Progress Notes (Signed)
D: Pt denies SI/HI/AVH, contracts for safety. Pt is pleasant and cooperative, but engagement in conversation the same. Pt. has no Complaints, just some anxiety during the middle of the night.  Patient interactions appropriate with staff and peers. Pt. Denies all psychiatric symptoms during assessments and reports doing, "very well, actually". Pt. Continues to not attend to hygiene. Pt. Attends snacks and groups.    A: Q x 15 minute observation checks were completed for safety. Patient was provided with education, but needs reinforcement.  Patient was given/offered medications per orders. Patient  was encourage to attend groups, participate in unit activities and continue with plan of care. Pt. Chart and plans of care reviewed. Pt. Given support and encouragement.   R: Patient is complaint with medication and unit procedures with direction and encouragement from staff. Pt. Utilizes CPAP with video surveillance present for safety. RT delivered breathing treatment this evening.               Precautionary checks every 15 minutes for safety maintained, room free of safety hazards, patient sustains no injury or falls during this shift. Will endorse care to next shift.

## 2018-03-08 NOTE — Progress Notes (Signed)
[facility] MD Progress Note  03/08/2018 12:45 PM DAKISHA SCHOOF  MRN:  [id number]  Subjective:    Ms. Fabio is all smiles today. She did not have any hallucination in the past 24 hours, feels "happy" and relaxed. She seems to tolerate Clozapine 25/125 mg well. No sedation. Slept well. Out of her room more today.   Yesterday, her husband noticed tremor. Today, she noticed it too while carrying a tray. Will hold Lithium tonight, lower Lithium to 450 mg nightly and recheck Lithium level.   Principal Problem: Paranoid schizophrenia (HCC) Diagnosis:   Patient Active Problem List   Diagnosis Date Noted  . Borderline personality disorder (HCC) [F60.3] 03/06/2018  . 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 Suicidal ideation [R45.851] 02/28/2018  . 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 Frequent falls [R29.6] 06/18/2015  . Tobacco use disorder [F17.200] 06/08/2015  . Hyperammonemia (HCC) [E72.20] 06/08/2015  . Paranoid schizophrenia (HCC) [F20.0]   . Migraine headache [G43.909] 06/04/2012  . Seizure (HCC) [R56.9] 06/04/2012  . Hypothyroidism [E03.9] 06/04/2012  . OSA (obstructive sleep apnea) [G47.33] 08/29/2011  . Post traumatic stress disorder (PTSD) [F43.10] 07/31/2011   Total Time spent with patient: 20 minutes  Past Psychiatric History: depression, psychoasis  Past Medical History:  Past Medical History:  Diagnosis Date  . Anemia   . Anxiety   . Bipolar 1 disorder (HCC)   . Depressed   . Headache(784.0)   . Heart murmur   . Hypothyroidism   . PTSD (post-traumatic stress disorder)   . Schizophrenia (HCC)   . Seizures (HCC)   . Shortness of breath     Past Surgical History:  Procedure Laterality Date  . FOOT SURGERY    . LEG SURGERY    . NASAL FRACTURE SURGERY    . TUBAL LIGATION  1996   Family History:  Family History  Adopted: Yes  Problem Relation Age of Onset  . Drug abuse Daughter        heroin addiction   Family  Psychiatric  History: see H&P Social History:  Social History   Substance and Sexual Activity  Alcohol Use No  . Alcohol/week: 0.0 standard drinks     Social History   Substance and Sexual Activity  Drug Use No    Social History   Socioeconomic History  . Marital status: Married    Spouse name: Not on file  . Number of children: 2  . Years of education: Not on file  . Highest education level: Not on file  Occupational History  . Occupation: unemployed    Associate Professor: NOT EMPLOYED  Social Needs  . Financial resource strain: Not on file  . Food insecurity:    Worry: Not on file    Inability: Not on file  . Transportation needs:    Medical: Not on file    Non-medical: Not on file  Tobacco Use  . Smoking status: Former Smoker    Packs/day: 1.50    Years: 30.00    Pack years: 45.00    Types: Cigarettes    Last attempt to quit: 09/20/2011    Years since quitting: 6.4  . Smokeless tobacco: Current User  Substance and Sexual Activity  . Alcohol use: No    Alcohol/week: 0.0 standard drinks  . Drug use: No  . Sexual activity: Yes    Partners: Male    Birth control/protection: Surgical  Lifestyle  . Physical activity:    Days per week: Not on file  Minutes per session: Not on file  . Stress: Not on file  Relationships  . Social connections:    Talks on phone: Not on file    Gets together: Not on file    Attends religious service: Not on file    Active member of club or organization: Not on file    Attends meetings of clubs or organizations: Not on file    Relationship status: Not on file  Other Topics Concern  . Not on file  Social History Narrative   Pt is adopted. Unsure of family history.   Additional Social History:                         Sleep: Fair  Appetite:  Fair  Current Medications: Current Facility-Administered Medications  Medication Dose Route Frequency Provider Last Rate Last Dose  . albuterol (PROVENTIL) (2.5 MG/3ML) 0.083%  nebulizer solution 2.5 mg  2.5 mg Nebulization Q6H PRN Clapacs, John T, MD   2.5 mg at 03/04/18 1337  . albuterol (PROVENTIL) (2.5 MG/3ML) 0.083% nebulizer solution 2.5 mg  2.5 mg Nebulization BID McNew, Ileene Hutchinson, MD   2.5 mg at 03/08/18 1003  . alum & mag hydroxide-simeth (MAALOX/MYLANTA) 200-200-20 MG/5ML suspension 30 mL  30 mL Oral Q4H PRN Clapacs, John T, MD      . aspirin EC tablet 81 mg  81 mg Oral Q breakfast Clapacs, Jackquline Denmark, MD   81 mg at 03/08/18 0804  . butalbital-acetaminophen-caffeine (FIORICET, ESGIC) 50-325-40 MG per tablet 1 tablet  1 tablet Oral Q8H PRN Haskell Riling, MD   1 tablet at 03/06/18 0408  . clonazePAM (KLONOPIN) tablet 0.25 mg  0.25 mg Oral QID PRN Haskell Riling, MD   0.25 mg at 03/08/18 0051  . cloZAPine (CLOZARIL) tablet 125 mg  125 mg Oral QHS Cainan Trull B, MD   125 mg at 03/07/18 2106  . cloZAPine (CLOZARIL) tablet 25 mg  25 mg Oral Daily McNew, Ileene Hutchinson, MD   25 mg at 03/08/18 0804  . diltiazem (CARDIZEM CD) 24 hr capsule 120 mg  120 mg Oral Daily Clapacs, John T, MD   120 mg at 03/08/18 0805  . fluticasone furoate-vilanterol (BREO ELLIPTA) 200-25 MCG/INH 1 puff  1 puff Inhalation Daily McNew, Ileene Hutchinson, MD   1 puff at 03/08/18 0805  . folic acid (FOLVITE) tablet 1 mg  1 mg Oral Daily Clapacs, John T, MD   1 mg at 03/08/18 0804  . haloperidol (HALDOL) tablet 2 mg  2 mg Oral Q6H PRN Haskell Riling, MD   2 mg at 03/07/18 0150  . hydrochlorothiazide (HYDRODIURIL) tablet 12.5 mg  12.5 mg Oral Daily Clapacs, John T, MD   12.5 mg at 03/08/18 0804  . ibuprofen (ADVIL,MOTRIN) tablet 800 mg  800 mg Oral Q8H PRN Clapacs, Jackquline Denmark, MD   800 mg at 03/08/18 0805  . lamoTRIgine (LAMICTAL) tablet 50 mg  50 mg Oral BID Haskell Riling, MD   50 mg at 03/08/18 0804  . levothyroxine (SYNTHROID, LEVOTHROID) tablet 100 mcg  100 mcg Oral Q0600 Clapacs, Jackquline Denmark, MD   100 mcg at 03/08/18 0607  . lithium carbonate (LITHOBID) CR tablet 300 mg  300 mg Oral Q12H McNew, Ileene Hutchinson, MD   300 mg at  03/08/18 0804  . magnesium hydroxide (MILK OF MAGNESIA) suspension 30 mL  30 mL Oral Daily PRN Clapacs, Jackquline Denmark, MD      . mirtazapine (  REMERON) tablet 15 mg  15 mg Oral QHS McNew, Ileene Hutchinson, MD   15 mg at 03/07/18 2107  . prazosin (MINIPRESS) capsule 2 mg  2 mg Oral QHS McNew, Ileene Hutchinson, MD   2 mg at 03/07/18 2106  . sulfamethoxazole-trimethoprim (BACTRIM DS,SEPTRA DS) 800-160 MG per tablet 1 tablet  1 tablet Oral Q12H McNew, Ileene Hutchinson, MD   1 tablet at 03/08/18 0805  . Topiramate ER (TROKENDI XR) CP24 100 mg  100 mg Oral QHS Mialynn Shelvin B, MD   100 mg at 03/07/18 2108    Lab Results:  Results for orders placed or performed during the hospital encounter of 02/27/18 (from the past 48 hour(s))  CBC with Differential/Platelet     Status: Abnormal   Collection Time: 03/07/18  7:10 AM  Result Value Ref Range   WBC 10.8 (H) 4.0 - 10.5 K/uL   RBC 3.92 3.87 - 5.11 MIL/uL   Hemoglobin 12.3 12.0 - 15.0 g/dL   HCT 40.9 81.1 - 91.4 %   MCV 99.2 80.0 - 100.0 fL   MCH 31.4 26.0 - 34.0 pg   MCHC 31.6 30.0 - 36.0 g/dL   RDW 78.2 95.6 - 21.3 %   Platelets 276 150 - 400 K/uL   nRBC 0.0 0.0 - 0.2 %   Neutrophils Relative % 76 %   Neutro Abs 8.3 (H) 1.7 - 7.7 K/uL   Lymphocytes Relative 15 %   Lymphs Abs 1.6 0.7 - 4.0 K/uL   Monocytes Relative 4 %   Monocytes Absolute 0.5 0.1 - 1.0 K/uL   Eosinophils Relative 2 %   Eosinophils Absolute 0.2 0.0 - 0.5 K/uL   Basophils Relative 1 %   Basophils Absolute 0.1 0.0 - 0.1 K/uL   Immature Granulocytes 2 %   Abs Immature Granulocytes 0.16 (H) 0.00 - 0.07 K/uL    Comment: Performed at [facility], [address] [place], [place] [address]  Lithium level     Status: None   Collection Time: 03/07/18  7:10 AM  Result Value Ref Range   Lithium Lvl 0.67 0.60 - 1.20 mmol/L    Comment: Performed at [facility], [address] [place], [place] [address]    Blood Alcohol level:  Lab Results  Component Value Date   [facility] <10 02/27/2018    ETH <5 08/06/2016    Metabolic Disorder Labs: Lab Results  Component Value Date   HGBA1C 5.8 (H) 02/28/2018   MPG 119.76 02/28/2018   MPG 123 (H) 02/05/2012   Lab Results  Component Value Date   PROLACTIN 39.1 (H) 06/08/2015   Lab Results  Component Value Date   CHOL 242 (H) 02/28/2018   TRIG 728 (H) 02/28/2018   HDL 32 (L) 02/28/2018   CHOLHDL 7.6 02/28/2018   VLDL UNABLE TO CALCULATE IF TRIGLYCERIDE OVER 400 mg/dL 29/52/8413   LDLCALC UNABLE TO CALCULATE IF TRIGLYCERIDE OVER 400 mg/dL 24/40/1027   LDLCALC 253 (H) 06/08/2015    Physical Findings: AIMS: Facial and Oral Movements Muscles of Facial Expression: None, normal Lips and Perioral Area: None, normal Jaw: None, normal Tongue: None, normal,Extremity Movements Upper (arms, wrists, hands, fingers): None, normal Lower (legs, knees, ankles, toes): None, normal, Trunk Movements Neck, shoulders, hips: None, normal, Overall Severity Severity of abnormal movements (highest score from questions above): None, normal Incapacitation due to abnormal movements: None, normal Patient's awareness of abnormal movements (rate only patient's report): No Awareness, Dental Status Current problems with teeth and/or dentures?: No Does patient  usually wear dentures?: No  CIWA:  CIWA-Ar Total: 2 COWS:  COWS Total Score: 2  Musculoskeletal: Strength & Muscle Tone: within normal limits Gait & Station: normal Patient leans: N/A  Psychiatric Specialty Exam: Physical Exam  Nursing note and vitals reviewed. Psychiatric: She has a normal mood and affect. Her speech is normal and behavior is normal. Judgment and thought content normal. Cognition and memory are normal.    Review of Systems  Neurological: Positive for tremors.  Psychiatric/Behavioral: Negative.   All other systems reviewed and are negative.   Blood pressure 122/64, pulse 90, temperature 98.5 F (36.9 C), temperature source Oral, resp. rate 16, height 5\' 9"  (1.753 m),  weight 120.2 kg, SpO2 95 %.Body mass index is 39.13 kg/m.  General Appearance: Casual  Eye Contact:  Good  Speech:  Clear and Coherent  Volume:  Normal  Mood:  Euthymic  Affect:  Appropriate  Thought Process:  Goal Directed and Descriptions of Associations: Intact  Orientation:  Full (Time, Place, and Person)  Thought Content:  WDL  Suicidal Thoughts:  No  Homicidal Thoughts:  No  Memory:  Immediate;   Fair Recent;   Fair Remote;   Fair  Judgement:  Fair  Insight:  Fair  Psychomotor Activity:  Normal  Concentration:  Concentration: Fair and Attention Span: Fair  Recall:  [employer] of Knowledge:  Fair  Language:  Fair  Akathisia:  No  Handed:  Right  AIMS (if indicated):     Assets:  Communication Skills Desire for Improvement Financial Resources/Insurance Housing Physical Health Resilience Social Support  ADL's:  Intact  Cognition:  WNL  Sleep:  Number of Hours: 7.3     Treatment Plan Summary: Daily contact with patient to assess and evaluate symptoms and progress in treatment and Medication management   47 yo female admitted due to [facility] and SI. She continues to have bouts of AH through the day. I continue to strongly feel these are self depreciating self thoughts related to past trauma and borderline personality disorder, rather than a psychotic disorder. She talks about how lonely she is at home and her husband works all the time. It appears she has "learned" behaviors to help get her the attention she craves such as hearing voices, suicidal thoughts and gestures and history of nonepileptic seizures. These symptosm have gained her significant attention from her husband and her providerse. Hospitalization is reinforcing to her because she does get a lot of attention with these "voices." Will try to start talking to her about discharge planning by early next week as the longer she is hospitalized the more dependant and reinforcing hospitalization will become. She does have  future oriented thinking such as going on a date with her husband, getting ACT team and looking forward to this. She is very organized in her thoughts. She has no delay in her responses despite "very loud voices." She admits to having very low self esteem and these voices are congruent with that. She has bright affect.   Plan:  Schizophrenia, much improvement -Clozapine 25 mg in AM, 125 mg nightly since 11/2 -haldol prn  PTSD -Continue Remeron 15 mg qhs -Continue Prazosin 2 mg qhs -hold Lithium tonight -consider lowering dose Lithium 300 mg BID, Lithioum level 0.67 on 11/2  Anxiety -Continue Klonopin 0.25 mg QID prn  Pseudoseizures -She was started on Trokendi XR which her husband brought in -Continue Lamictal 50 mg BID  HTN -Cardizem and HCTZ -Pt had ECHO done 02/15/18  Hypothyroid -Synthroid  100 mcg -TSH normal range  Migraines -prn Fioricetbut lower dose to only 1 tablet q8 hrs  Elevated WBC -UA shows positive nitrite, positive RBC, >50 WBC and few bacteria.Started Bactrim for 7 days -Cultureshows >100,000 colonie of klebsiella pneumonia.Sensitive to Bactrim  Dispo -Discharge home when stable. She was apparently referred to ACT team at recent hospitalization. Possibly [facility]  Kristine Linea, MD 03/08/2018, 12:45 PM

## 2018-03-08 NOTE — Progress Notes (Addendum)
D:Pt denies SI/HI/AVH, contracts for safety. Pt is pleasant and cooperative. Pt.has no Complaints.Patient interactions appropriate with staff and peers. Pt. Denies all psychiatric symptoms.   A: Q x 15 minute observation checks were completed for safety. Patient was provided with education, but needs reinforcement.  Pt. Chart and plans of care reviewed. Pt. Given support and encouragement.  R:Patient is complaint with medication and unit procedures with direction and encouragement from staff.

## 2018-03-08 NOTE — Plan of Care (Signed)
Pt. Denies si/hi, verbally is able to contract for safety. Pt. Reports today was a good day and a big improvement. Pt. Denies AVH.    Problem: Self-Concept: Goal: Ability to disclose and discuss suicidal ideas will improve Outcome: Progressing Goal: Will verbalize positive feelings about self Outcome: Progressing   Problem: Safety: Goal: Ability to remain free from injury will improve Outcome: Progressing

## 2018-03-08 NOTE — [facility] Group Notes (Signed)
LCSW Group Therapy Note 03/08/2018 1:15pm  Type of Therapy and Topic: Group Therapy: Feelings Around Returning Home & Establishing a Supportive Framework and Supporting Oneself When Supports Not Available  Participation Level: Active  Description of Group:  Patients first processed thoughts and feelings about upcoming discharge. These included fears of upcoming changes, lack of change, new living environments, judgements and expectations from others and overall stigma of mental health issues. The group then discussed the definition of a supportive framework, what that looks and feels like, and how do to discern it from an unhealthy non-supportive network. The group identified different types of supports as well as what to do when your family/friends are less than helpful or unavailable  Therapeutic Goals  1. Patient will identify one healthy supportive network that they can use at discharge. 2. Patient will identify one factor of a supportive framework and how to tell it from an unhealthy network. 3. Patient able to identify one coping skill to use when they do not have positive supports from others. 4. Patient will demonstrate ability to communicate their needs through discussion and/or role plays.  Summary of Patient Progress:  The patient reported she feels "good." Pt engaged during group session. As patients processed their anxiety about discharge and described healthy supports patient shared she is ready to be discharge. She reported "voices are in a manageable level." Patient listed her husband as her main support.  Patients identified at least one self-care tool they were willing to use after discharge; grounding exercises.   Therapeutic Modalities Cognitive Behavioral Therapy Motivational Interviewing   Xiong Haidar  CUEBAS-COLON, LCSW 03/08/2018 1:08 PM

## 2018-03-09 ENCOUNTER — Ambulatory Visit (HOSPITAL_COMMUNITY): Payer: BLUE CROSS/BLUE SHIELD | Admitting: Psychiatry

## 2018-03-09 MED ORDER — ONDANSETRON HCL 4 MG PO TABS
4.0000 mg | ORAL_TABLET | Freq: Three times a day (TID) | ORAL | Status: DC | PRN
  Administered 2018-03-09: 4 mg via ORAL
  Filled 2018-03-09 (×2): qty 1

## 2018-03-09 MED ORDER — CLONAZEPAM 0.5 MG PO TABS: 0.2500 mg | ORAL_TABLET | Freq: Four times a day (QID) | ORAL | 0 refills | Status: DC | PRN

## 2018-03-09 MED ORDER — PRAZOSIN HCL 2 MG PO CAPS: 2.0000 mg | ORAL_CAPSULE | Freq: Every day | ORAL | 0 refills | Status: DC

## 2018-03-09 MED ORDER — CLOZAPINE 25 MG PO TABS: 25.0000 mg | ORAL_TABLET | Freq: Two times a day (BID) | ORAL | 0 refills | Status: DC

## 2018-03-09 MED ORDER — MIRTAZAPINE 15 MG PO TABS: 15.0000 mg | ORAL_TABLET | Freq: Every day | ORAL | 0 refills | Status: DC

## 2018-03-09 MED ORDER — LAMOTRIGINE 25 MG PO TABS: 50.0000 mg | ORAL_TABLET | Freq: Two times a day (BID) | ORAL | 0 refills | Status: DC

## 2018-03-09 NOTE — Plan of Care (Signed)
Patient denies si/hi, contracts for safety. Pt. Reports doing, "good" this evening and that she had a good day. Interactions with peers and staff appropriate. Pt. Reports she can remain safe while on the unit.    Problem: Self-Concept: Goal: Ability to disclose and discuss suicidal ideas will improve Outcome: Progressing Goal: Will verbalize positive feelings about self Outcome: Progressing   Problem: Role Relationship: Goal: Ability to interact with others will improve Outcome: Progressing   Problem: Safety: Goal: Ability to remain free from injury will improve Outcome: Progressing

## 2018-03-09 NOTE — Plan of Care (Signed)
Pt. Denies si/hi, verbally is able to contract for safety. Pt. Reports she can remain safe while on the unit. Pt. Interactions with staff and peers appropriate. Pt. Coping improved, reports anxiety, "low" and denies depression.    Problem: Self-Concept: Goal: Ability to disclose and discuss suicidal ideas will improve Outcome: Progressing   Problem: Coping: Goal: Coping ability will improve Outcome: Progressing   Problem: Self-Concept: Goal: Will verbalize positive feelings about self Outcome: Progressing   Problem: Role Relationship: Goal: Ability to interact with others will improve Outcome: Progressing   Problem: Safety: Goal: Ability to remain free from injury will improve Outcome: Progressing

## 2018-03-09 NOTE — Progress Notes (Signed)
D: Pt denies SI/HI/AVH, contracts for safety. Pt is pleasant and cooperative, engages appropriately during assessments. Pt. has no Complaints, just eager to discharge reportedly.  Patient Interactions appropriate with staff and peers. Pt. Affect brighter when speaking to this writer, but mostly flat facial expression. Pt. Denies depression, reports anxiety, "low".   A: Q x 15 minute observation checks were completed for safety. Patient was provided with education, needs reinforcement.  Patient was given/offered medications per orders. Patient  was encourage to attend groups, participate in unit activities and continue with plan of care. Pt. Chart and plans of care reviewed. Pt. Given support and encouragement.   R: Patient is complaint with medication and unit procedures with direction and encouragement. Pt. Given falls risk education. Pt. Utilizes CPAP for sleep with video surveillance for safety. No noticeable tremor this evening.             Precautionary checks every 15 minutes for safety maintained, room free of safety hazards, patient sustains no injury or falls during this shift. Will endorse care to next shift.

## 2018-03-09 NOTE — Progress Notes (Signed)
[facility] MD Progress Note  03/09/2018 12:37 PM Danielle [name]  MRN:  [id number] Subjective:  Pt states that she is starting to feel better. She states that the voices are still there but are much more manageable. She states that they are "not screaming in my ears anymore." Lithium was held due to tremor last night. Lithium level was within therapeutic range. She did have some nausea this morning. She reports that she slept great last night and nightmares are minimal. She denies SI or any thoughts of self harm. She has slightly rapid speech but is organized in her thoughts. She has brighter affect and smiling. She states that she may feel safe discharging tomorrow if she has a good day today with minimal voices.   Principal Problem: Paranoid schizophrenia (HCC) Diagnosis:   Patient Active Problem List   Diagnosis Date Noted  . Borderline personality disorder (HCC) [F60.3] 03/06/2018    Priority: High  . 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 Suicidal ideation [R45.851] 02/28/2018  . 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 Frequent falls [R29.6] 06/18/2015  . Tobacco use disorder [F17.200] 06/08/2015  . Hyperammonemia (HCC) [E72.20] 06/08/2015  . Paranoid schizophrenia (HCC) [F20.0]   . Migraine headache [G43.909] 06/04/2012  . Seizure (HCC) [R56.9] 06/04/2012  . Hypothyroidism [E03.9] 06/04/2012  . OSA (obstructive sleep apnea) [G47.33] 08/29/2011  . Post traumatic stress disorder (PTSD) [F43.10] 07/31/2011   Total Time spent with patient: 20 minutes  Past Psychiatric History: See H&p  Past Medical History:  Past Medical History:  Diagnosis Date  . Anemia   . Anxiety   . Bipolar 1 disorder (HCC)   . Depressed   . Headache(784.0)   . Heart murmur   . Hypothyroidism   . PTSD (post-traumatic stress disorder)   . Schizophrenia (HCC)   . Seizures (HCC)   . Shortness of breath     Past Surgical History:  Procedure Laterality Date  .  FOOT SURGERY    . LEG SURGERY    . NASAL FRACTURE SURGERY    . TUBAL LIGATION  1996   Family History:  Family History  Adopted: Yes  Problem Relation Age of Onset  . Drug abuse Daughter        heroin addiction   Family Psychiatric  History: See H&P Social History:  Social History   Substance and Sexual Activity  Alcohol Use No  . Alcohol/week: 0.0 standard drinks     Social History   Substance and Sexual Activity  Drug Use No    Social History   Socioeconomic History  . Marital status: Married    Spouse name: Not on file  . Number of children: 2  . Years of education: Not on file  . Highest education level: Not on file  Occupational History  . Occupation: unemployed    Associate Professor: NOT EMPLOYED  Social Needs  . Financial resource strain: Not on file  . Food insecurity:    Worry: Not on file    Inability: Not on file  . Transportation needs:    Medical: Not on file    Non-medical: Not on file  Tobacco Use  . Smoking status: Former Smoker    Packs/day: 1.50    Years: 30.00    Pack years: 45.00    Types: Cigarettes    Last attempt to quit: 09/20/2011    Years since quitting: 6.4  . Smokeless tobacco: Current User  Substance and Sexual Activity  . Alcohol use:  No    Alcohol/week: 0.0 standard drinks  . Drug use: No  . Sexual activity: Yes    Partners: Male    Birth control/protection: Surgical  Lifestyle  . Physical activity:    Days per week: Not on file    Minutes per session: Not on file  . Stress: Not on file  Relationships  . Social connections:    Talks on phone: Not on file    Gets together: Not on file    Attends religious service: Not on file    Active member of club or organization: Not on file    Attends meetings of clubs or organizations: Not on file    Relationship status: Not on file  Other Topics Concern  . Not on file  Social History Narrative   Pt is adopted. Unsure of family history.   Additional Social History:                          Sleep: Good  Appetite:  Good  Current Medications: Current Facility-Administered Medications  Medication Dose Route Frequency Provider Last Rate Last Dose  . albuterol (PROVENTIL) (2.5 MG/3ML) 0.083% nebulizer solution 2.5 mg  2.5 mg Nebulization Q6H PRN Clapacs, John T, MD   2.5 mg at 03/04/18 1337  . albuterol (PROVENTIL) (2.5 MG/3ML) 0.083% nebulizer solution 2.5 mg  2.5 mg Nebulization BID Jaylianna Tatlock, Ileene Hutchinson, MD   2.5 mg at 03/08/18 2124  . alum & mag hydroxide-simeth (MAALOX/MYLANTA) 200-200-20 MG/5ML suspension 30 mL  30 mL Oral Q4H PRN Clapacs, John T, MD   30 mL at 03/08/18 1840  . aspirin EC tablet 81 mg  81 mg Oral Q breakfast Clapacs, Jackquline Denmark, MD   81 mg at 03/09/18 0911  . butalbital-acetaminophen-caffeine (FIORICET, ESGIC) 50-325-40 MG per tablet 1 tablet  1 tablet Oral Q8H PRN Haskell Riling, MD   1 tablet at 03/06/18 0408  . clonazePAM (KLONOPIN) tablet 0.25 mg  0.25 mg Oral QID PRN Haskell Riling, MD   0.25 mg at 03/08/18 0051  . cloZAPine (CLOZARIL) tablet 125 mg  125 mg Oral QHS Pucilowska, Jolanta B, MD   125 mg at 03/08/18 2132  . cloZAPine (CLOZARIL) tablet 25 mg  25 mg Oral Daily Bryley Chrisman, Ileene Hutchinson, MD   25 mg at 03/09/18 0912  . diltiazem (CARDIZEM CD) 24 hr capsule 120 mg  120 mg Oral Daily Clapacs, John T, MD   120 mg at 03/09/18 0913  . fluticasone furoate-vilanterol (BREO ELLIPTA) 200-25 MCG/INH 1 puff  1 puff Inhalation Daily Anet Logsdon, Ileene Hutchinson, MD   1 puff at 03/09/18 0914  . folic acid (FOLVITE) tablet 1 mg  1 mg Oral Daily Clapacs, John T, MD   1 mg at 03/09/18 0912  . haloperidol (HALDOL) tablet 2 mg  2 mg Oral Q6H PRN Haskell Riling, MD   2 mg at 03/07/18 0150  . hydrochlorothiazide (HYDRODIURIL) tablet 12.5 mg  12.5 mg Oral Daily Clapacs, John T, MD   12.5 mg at 03/09/18 0910  . ibuprofen (ADVIL,MOTRIN) tablet 800 mg  800 mg Oral Q8H PRN Clapacs, Jackquline Denmark, MD   800 mg at 03/08/18 0805  . lamoTRIgine (LAMICTAL) tablet 50 mg  50 mg Oral BID Haskell Riling, MD    50 mg at 03/09/18 0911  . levothyroxine (SYNTHROID, LEVOTHROID) tablet 100 mcg  100 mcg Oral Q0600 Clapacs, Jackquline Denmark, MD   100 mcg at 03/09/18 0604  .  magnesium hydroxide (MILK OF MAGNESIA) suspension 30 mL  30 mL Oral Daily PRN Clapacs, John T, MD      . mirtazapine (REMERON) tablet 15 mg  15 mg Oral QHS Sherrill Mckamie, Ileene Hutchinson, MD   15 mg at 03/08/18 2132  . ondansetron (ZOFRAN) tablet 4 mg  4 mg Oral Q8H PRN Haskell Riling, MD   4 mg at 03/09/18 0951  . prazosin (MINIPRESS) capsule 2 mg  2 mg Oral QHS Elpidio Thielen, Ileene Hutchinson, MD   2 mg at 03/08/18 2131  . sulfamethoxazole-trimethoprim (BACTRIM DS,SEPTRA DS) 800-160 MG per tablet 1 tablet  1 tablet Oral Q12H Militza Devery, Ileene Hutchinson, MD   1 tablet at 03/09/18 0913  . Topiramate ER (TROKENDI XR) CP24 100 mg  100 mg Oral QHS Pucilowska, Jolanta B, MD   100 mg at 03/08/18 2131    Lab Results:  Results for orders placed or performed during the hospital encounter of 02/27/18 (from the past 48 hour(s))  Lithium level     Status: Abnormal   Collection Time: 03/08/18  7:36 PM  Result Value Ref Range   Lithium Lvl 0.59 (L) 0.60 - 1.20 mmol/L    Comment: Performed at [facility], [address] [place], [place] [address]    Blood Alcohol level:  Lab Results  Component Value Date   [facility] <10 02/27/2018   ETH <5 08/06/2016    Metabolic Disorder Labs: Lab Results  Component Value Date   HGBA1C 5.8 (H) 02/28/2018   MPG 119.76 02/28/2018   MPG 123 (H) 02/05/2012   Lab Results  Component Value Date   PROLACTIN 39.1 (H) 06/08/2015   Lab Results  Component Value Date   CHOL 242 (H) 02/28/2018   TRIG 728 (H) 02/28/2018   HDL 32 (L) 02/28/2018   CHOLHDL 7.6 02/28/2018   VLDL UNABLE TO CALCULATE IF TRIGLYCERIDE OVER 400 mg/dL 52/84/1324   LDLCALC UNABLE TO CALCULATE IF TRIGLYCERIDE OVER 400 mg/dL 40/02/2724   LDLCALC 366 (H) 06/08/2015    Physical Findings: AIMS: Facial and Oral Movements Muscles of Facial Expression: None, normal Lips and Perioral  Area: None, normal Jaw: None, normal Tongue: None, normal,Extremity Movements Upper (arms, wrists, hands, fingers): None, normal Lower (legs, knees, ankles, toes): None, normal, Trunk Movements Neck, shoulders, hips: None, normal, Overall Severity Severity of abnormal movements (highest score from questions above): None, normal Incapacitation due to abnormal movements: None, normal Patient's awareness of abnormal movements (rate only patient's report): No Awareness, Dental Status Current problems with teeth and/or dentures?: No Does patient usually wear dentures?: No  CIWA:  CIWA-Ar Total: 2 COWS:  COWS Total Score: 2  Musculoskeletal: Strength & Muscle Tone: within normal limits Gait & Station: normal Patient leans: N/A  Psychiatric Specialty Exam: Physical Exam  Nursing note and vitals reviewed.   Review of Systems  All other systems reviewed and are negative.   Blood pressure 128/78, pulse 86, temperature 98.6 F (37 C), temperature source Oral, resp. rate 18, height 5\' 9"  (1.753 m), weight 120.2 kg, SpO2 95 %.Body mass index is 39.13 kg/m.  General Appearance: Casual  Eye Contact:  Good  Speech:  Clear and Coherent  Volume:  Normal  Mood:  Euthymic  Affect:  Congruent  Thought Process:  Coherent and Goal Directed  Orientation:  Full (Time, Place, and Person)  Thought Content:  Logical  Suicidal Thoughts:  No  Homicidal Thoughts:  No  Memory:  Immediate;   Fair  Judgement:  Fair  Insight:  Fair  Psychomotor Activity:  Normal  Concentration:  Concentration: Fair  Recall:  [employer] of Knowledge:  Fair  Language:  Fair  Akathisia:  No      Assets:  Resilience  ADL's:  Intact  Cognition:  WNL  Sleep:  Number of Hours: 7.15     Treatment Plan Summary: 46 yo female admitted due to distressing AH. Voices are much more manageable for her. She denies SI. She is tolerating Clozapine well. Lithium was held due to tremor She is on many medications including mood  stabilizer. Will discontinue Lithium all together for now as she does not likely need this much polypharmacy and would increase risks of more side effects. Her mood has been stable.  Lithium level was normal.   Plan:  Schizophrenia -Continue Clozapine 25 mg qam and 125 mg qhs  PTSD -Continue Remeron 15 mg qhs -Continue Prazosin 2 mg qhs -Discontinue Lithium all together due to polypharmacy and tremor  Anxiety -Continue Klonopin 0.25 mg QID prn  Pseudoseizures -She was started on Trokendi XR which her husband brought in -Continue Lamictal 50 mg BID  HTN -Cardizem and HCTZ -Pt had ECHO done 02/15/18  Hypothyroid -Synthroid 100 mcg -TSH normal range  Migraines -prn Fioricetbut lower dose to only 1 tablet q8 hrs  Elevated WBC -UA shows positive nitrite, positive RBC, >50 WBC and few bacteria.Started Bactrim for 7 days -Cultureshows >100,000 colonie of klebsiella pneumonia.Sensitive to Bactrim  Dispo -Discharge home when stable. She was apparently referred to ACT team at recent hospitalization. Possibly [facility], MD 03/09/2018, 12:37 PM

## 2018-03-09 NOTE — Progress Notes (Signed)
D: Pt denies SI/HI/AVH, contracts for safety. Pt is pleasant and cooperative, engages appropriatley. Pt. has no Complaints, but reports a noticeable tremor in her hands that MD is aware of.  Patient Interactions appropriate with staff and peers. No behavioral concerns to report. Denies depression and anxiety. Mood and affect improved with this Clinical research associate. Pt. Utilizes CPAP for sleep-aid per orders. Pt. Reports improvement in reported heartburn from day shift.  A: Q x 15 minute observation checks were completed for safety. Patient was provided with education.  Patient was given/offered medications per orders. Patient  was encourage to attend groups, participate in unit activities and continue with plan of care. Pt. Chart and plans of care reviewed. Pt. Given support and encouragement.   R: Patient is complaint with medication and unit procedures.             Precautionary checks every 15 minutes for safety maintained, room free of safety hazards, patient sustains no injury or falls during this shift. Will endorse care to next shift.

## 2018-03-09 NOTE — [facility] Group Notes (Signed)
[facility] Group Notes:  (Nursing/MHT/Case Management/Adjunct)  Date:  03/09/2018  Time:  9:09 PM  Type of Therapy:  Group Therapy  Participation Level:  Active  Participation Quality:  Appropriate  Affect:  Appropriate  Cognitive:  Appropriate  Insight:  Appropriate  Engagement in Group:  Engaged  Modes of Intervention:  Discussion  Summary of Progress/Problems:  Danielle [name] Danielle [name] 03/09/2018, 9:09 PM 

## 2018-03-09 NOTE — Plan of Care (Signed)
Patients affect is brighter today.States "I am not hearing any voices today".Denies SI,HI and AVH.Stayed in bed most of the shift.Zofran helped with nausea.Compliant with medications.Appetite and energy level fair.Did not attend groups.Support and encouragement given.

## 2018-03-09 NOTE — Tx Team (Signed)
Interdisciplinary Treatment and Diagnostic Plan Update  03/09/2018 Time of Session: 8:30am Danielle [name] MRN: [id number]  Principal Diagnosis: Paranoid schizophrenia (HCC)  Secondary Diagnoses: Principal Problem:   Paranoid schizophrenia (HCC) Active Problems:   Post traumatic stress disorder (PTSD)   Hypothyroidism   Severe major depression, single episode, with psychotic features (HCC)   Suicidal ideation   Borderline personality disorder (HCC)   Pseudoseizures   Current Medications:  Current Facility-Administered Medications  Medication Dose Route Frequency Provider Last Rate Last Dose  . albuterol (PROVENTIL) (2.5 MG/3ML) 0.083% nebulizer solution 2.5 mg  2.5 mg Nebulization Q6H PRN Clapacs, John T, MD   2.5 mg at 03/04/18 1337  . albuterol (PROVENTIL) (2.5 MG/3ML) 0.083% nebulizer solution 2.5 mg  2.5 mg Nebulization BID McNew, Ileene Hutchinson, MD   2.5 mg at 03/08/18 2124  . alum & mag hydroxide-simeth (MAALOX/MYLANTA) 200-200-20 MG/5ML suspension 30 mL  30 mL Oral Q4H PRN Clapacs, John T, MD   30 mL at 03/08/18 1840  . aspirin EC tablet 81 mg  81 mg Oral Q breakfast Clapacs, Jackquline Denmark, MD   81 mg at 03/09/18 0911  . butalbital-acetaminophen-caffeine (FIORICET, ESGIC) 50-325-40 MG per tablet 1 tablet  1 tablet Oral Q8H PRN Haskell Riling, MD   1 tablet at 03/06/18 0408  . clonazePAM (KLONOPIN) tablet 0.25 mg  0.25 mg Oral QID PRN Haskell Riling, MD   0.25 mg at 03/08/18 0051  . cloZAPine (CLOZARIL) tablet 125 mg  125 mg Oral QHS Pucilowska, Jolanta B, MD   125 mg at 03/08/18 2132  . cloZAPine (CLOZARIL) tablet 25 mg  25 mg Oral Daily McNew, Ileene Hutchinson, MD   25 mg at 03/09/18 0912  . diltiazem (CARDIZEM CD) 24 hr capsule 120 mg  120 mg Oral Daily Clapacs, John T, MD   120 mg at 03/09/18 0913  . fluticasone furoate-vilanterol (BREO ELLIPTA) 200-25 MCG/INH 1 puff  1 puff Inhalation Daily McNew, Ileene Hutchinson, MD   1 puff at 03/09/18 0914  . folic acid (FOLVITE) tablet 1 mg  1 mg Oral Daily Clapacs,  John T, MD   1 mg at 03/09/18 0912  . haloperidol (HALDOL) tablet 2 mg  2 mg Oral Q6H PRN Haskell Riling, MD   2 mg at 03/07/18 0150  . hydrochlorothiazide (HYDRODIURIL) tablet 12.5 mg  12.5 mg Oral Daily Clapacs, John T, MD   12.5 mg at 03/09/18 0910  . ibuprofen (ADVIL,MOTRIN) tablet 800 mg  800 mg Oral Q8H PRN Clapacs, Jackquline Denmark, MD   800 mg at 03/08/18 0805  . lamoTRIgine (LAMICTAL) tablet 50 mg  50 mg Oral BID Haskell Riling, MD   50 mg at 03/09/18 0911  . levothyroxine (SYNTHROID, LEVOTHROID) tablet 100 mcg  100 mcg Oral Q0600 Clapacs, Jackquline Denmark, MD   100 mcg at 03/09/18 0604  . magnesium hydroxide (MILK OF MAGNESIA) suspension 30 mL  30 mL Oral Daily PRN Clapacs, John T, MD      . mirtazapine (REMERON) tablet 15 mg  15 mg Oral QHS McNew, Ileene Hutchinson, MD   15 mg at 03/08/18 2132  . ondansetron (ZOFRAN) tablet 4 mg  4 mg Oral Q8H PRN Haskell Riling, MD   4 mg at 03/09/18 0951  . prazosin (MINIPRESS) capsule 2 mg  2 mg Oral QHS McNew, Ileene Hutchinson, MD   2 mg at 03/08/18 2131  . sulfamethoxazole-trimethoprim (BACTRIM DS,SEPTRA DS) 800-160 MG per tablet 1 tablet  1 tablet Oral Q12H McNew,  Ileene Hutchinson, MD   1 tablet at 03/09/18 0913  . Topiramate ER (TROKENDI XR) CP24 100 mg  100 mg Oral QHS Pucilowska, Jolanta B, MD   100 mg at 03/08/18 2131   PTA Medications: Medications Prior to Admission  Medication Sig Dispense Refill Last Dose  . lamoTRIgine (LAMICTAL) 25 MG tablet Take 50 mg by mouth 2 (two) times daily.     Marland Kitchen acetaminophen (TYLENOL 8 HOUR) 650 MG CR tablet Take 1 tablet (650 mg total) by mouth every 8 (eight) hours as needed for pain. (Patient not taking: Reported on 02/27/2018) 30 tablet 0 Not Taking at Unknown time  . aspirin EC 81 MG tablet Take 1 tablet (81 mg total) by mouth daily with breakfast. (Patient not taking: Reported on 02/27/2018) 30 tablet 0 Not Taking at Unknown time  . clonazePAM (KLONOPIN) 0.5 MG tablet Take 1 tablet (0.5 mg total) by mouth 4 (four) times daily as needed for anxiety. 120  tablet 1 Taking  . diltiazem (DILACOR XR) 120 MG 24 hr capsule Take 120 mg by mouth daily.   Taking  . eletriptan (RELPAX) 40 MG tablet TAKE 1 TAB AT ONSET OF HEADACHES. MAY REPEAT IN 2 HOURS.MAX 2/24 HRS. FOR 30 DAYS  6 Taking  . folic acid (FOLVITE) 1 MG tablet Take 1 tablet (1 mg total) by mouth daily. 30 tablet 0 Taking  . hydrochlorothiazide (HYDRODIURIL) 12.5 MG tablet TAKE ONE TABLET (12.5 MG DOSE) BY MOUTH DAILY. TAKE ON TABLET BY MOUTH ONCE A DAY IN THE MORNING.  0 Taking  . ibuprofen (ADVIL,MOTRIN) 800 MG tablet Take 800 mg by mouth every 8 (eight) hours as needed (pain).   Taking  . levothyroxine (SYNTHROID, LEVOTHROID) 100 MCG tablet TAKE ONE TABLET (100 MCG DOSE) BY MOUTH DAILY.  5 Taking  . lithium carbonate (ESKALITH) 450 MG CR tablet Take 1 tablet (450 mg total) by mouth daily. 90 tablet 0 Taking  . loxapine (LOXITANE) 50 MG capsule Take 1 capsule (50 mg total) by mouth 2 (two) times daily. for acute psychosis 180 capsule 0   . PARoxetine (PAXIL-CR) 37.5 MG 24 hr tablet Take 1 tablet (37.5 mg total) by mouth every morning. 90 tablet 0 Taking  . POTASSIUM PO Take by mouth.   Taking  . prazosin (MINIPRESS) 2 MG capsule Take 1 capsule (2 mg total) by mouth at bedtime. 90 capsule 0 Taking  . TROKENDI XR 100 MG CP24 Take 1 tablet by mouth daily.  3 Taking  . TROKENDI XR 200 MG CP24 Take 1 tablet by mouth daily.  3 Taking  . Vitamin D, Ergocalciferol, (DRISDOL) 50000 units CAPS capsule Take 50,000 Units by mouth every 7 (seven) days. saturday   Taking    Patient Stressors: Financial difficulties Medication change or noncompliance Substance abuse  Patient Strengths: Capable of independent living Motivation for treatment/growth Supportive family/friends  Treatment Modalities: Medication Management, Group therapy, Case management,  1 to 1 session with clinician, Psychoeducation, Recreational therapy.   Physician Treatment Plan for Primary Diagnosis: Paranoid schizophrenia  (HCC) Long Term Goal(s): Improvement in symptoms so as ready for discharge Improvement in symptoms so as ready for discharge   Short Term Goals: Ability to verbalize feelings will improve Ability to disclose and discuss suicidal ideas Ability to maintain clinical measurements within normal limits will improve Compliance with prescribed medications will improve  Medication Management: Evaluate patient's response, side effects, and tolerance of medication regimen.  Therapeutic Interventions: 1 to 1 sessions, Unit Group sessions and Medication administration.  Evaluation of Outcomes: Progressing  Physician Treatment Plan for Secondary Diagnosis: Principal Problem:   Paranoid schizophrenia (HCC) Active Problems:   Post traumatic stress disorder (PTSD)   Hypothyroidism   Severe major depression, single episode, with psychotic features (HCC)   Suicidal ideation   Borderline personality disorder (HCC)   Pseudoseizures  Long Term Goal(s): Improvement in symptoms so as ready for discharge Improvement in symptoms so as ready for discharge   Short Term Goals: Ability to verbalize feelings will improve Ability to disclose and discuss suicidal ideas Ability to maintain clinical measurements within normal limits will improve Compliance with prescribed medications will improve     Medication Management: Evaluate patient's response, side effects, and tolerance of medication regimen.  Therapeutic Interventions: 1 to 1 sessions, Unit Group sessions and Medication administration.  Evaluation of Outcomes: Progressing   RN Treatment Plan for Primary Diagnosis: Paranoid schizophrenia (HCC) Long Term Goal(s): Knowledge of disease and therapeutic regimen to maintain health will improve  Short Term Goals: Ability to verbalize feelings will improve, Ability to identify and develop effective coping behaviors will improve and Compliance with prescribed medications will improve  Medication  Management: RN will administer medications as ordered by provider, will assess and evaluate patient's response and provide education to patient for prescribed medication. RN will report any adverse and/or side effects to prescribing provider.  Therapeutic Interventions: 1 on 1 counseling sessions, Psychoeducation, Medication administration, Evaluate responses to treatment, Monitor vital signs and CBGs as ordered, Perform/monitor CIWA, COWS, AIMS and Fall Risk screenings as ordered, Perform wound care treatments as ordered.  Evaluation of Outcomes: Progressing   LCSW Treatment Plan for Primary Diagnosis: Paranoid schizophrenia (HCC) Long Term Goal(s): Safe transition to appropriate next level of care at discharge, Engage patient in therapeutic group addressing interpersonal concerns.  Short Term Goals: Engage patient in aftercare planning with referrals and resources, Increase ability to appropriately verbalize feelings, Increase emotional regulation, Identify triggers associated with mental health/substance abuse issues and Increase skills for wellness and recovery  Therapeutic Interventions: Assess for all discharge needs, 1 to 1 time with Social worker, Explore available resources and support systems, Assess for adequacy in community support network, Educate family and significant other(s) on suicide prevention, Complete Psychosocial Assessment, Interpersonal group therapy.  Evaluation of Outcomes: Progressing   Progress in Treatment: Attending groups: Yes. Participating in groups: Yes. Taking medication as prescribed: Yes. Toleration medication: Yes. Family/Significant other contact made: Yes, individual(s) contacted:  Lerry Liner, husband Patient understands diagnosis: Yes. Discussing patient identified problems/goals with staff: Yes. Medical problems stabilized or resolved: Yes. Denies suicidal/homicidal ideation: Yes. Issues/concerns per patient self-inventory: No.  Other:  NA  New problem(s) identified: Yes, Describe:  "To manage my voices, find outlets in my area where I can be more social"  New Short Term/Long Term Goal(s):"To manage my voices, find outlet in my area where I can be more social"  Patient Goals:  "To manage my voices, find outlet in my area where I can be more social"  Discharge Plan or Barriers: Dx home and follow up with outpatient treatment  Reason for Continuation of Hospitalization: Medication stabilization  Estimated Length of Stay: 1-2 days  Recreational Therapy: Patient Stressors: Death Patient Goal: Patient will identify 3 benefits of self-care within 5 recreation therapy group sessions  Attendees: Patient: 03/09/2018 11:29 AM  Physician: Dr. Johnella Moloney 03/09/2018 11:29 AM  Nursing: Cecille Amsterdam RN 03/09/2018 11:29 AM  RN Care Manager: 03/09/2018 11:29 AM  Social Worker: Johny Shears, LCSWA 03/09/2018 11:29 AM  Recreational  Therapist:  03/09/2018 11:29 AM  Other: Lowella Dandy, LCSW 03/09/2018 11:29 AM  Other: Unk Pinto. RHA 03/09/2018 11:29 AM  Other: 03/09/2018 11:29 AM    Scribe for Treatment Team: Johny Shears, LCSW 03/09/2018 11:29 AM

## 2018-03-10 LAB — CBC WITH DIFFERENTIAL/PLATELET
Abs Immature Granulocytes: 0.15 10*3/uL — ABNORMAL HIGH (ref 0.00–0.07)
Basophils Absolute: 0 10*3/uL (ref 0.0–0.1)
Basophils Relative: 0 %
EOS ABS: 0.3 10*3/uL (ref 0.0–0.5)
EOS PCT: 3 %
HEMATOCRIT: 38 % (ref 36.0–46.0)
Hemoglobin: 12.2 g/dL (ref 12.0–15.0)
Immature Granulocytes: 1 %
LYMPHS ABS: 1.8 10*3/uL (ref 0.7–4.0)
Lymphocytes Relative: 17 %
MCH: 31.8 pg (ref 26.0–34.0)
MCHC: 32.1 g/dL (ref 30.0–36.0)
MCV: 99 fL (ref 80.0–100.0)
MONO ABS: 0.5 10*3/uL (ref 0.1–1.0)
MONOS PCT: 5 %
NRBC: 0 % (ref 0.0–0.2)
Neutro Abs: 7.9 10*3/uL — ABNORMAL HIGH (ref 1.7–7.7)
Neutrophils Relative %: 74 %
PLATELETS: 244 10*3/uL (ref 150–400)
RBC: 3.84 MIL/uL — ABNORMAL LOW (ref 3.87–5.11)
RDW: 13.7 % (ref 11.5–15.5)
WBC: 10.7 10*3/uL — ABNORMAL HIGH (ref 4.0–10.5)

## 2018-03-10 NOTE — Progress Notes (Signed)
Received Danielle [name] this AM asleep in bed, he was awaken for her medications and to prepare for discharge. She rated depression,anxiety and hopelessness 3/10 on her self inventory form and stated the symptoms are manageable for discharge home today.  She received her discharge order.The AVS was reviewed and questions answered. She received her prescriptions. Her personal belongings and medications were returned. She was discharge with her husband without incident at 1050 hrs.

## 2018-03-10 NOTE — Discharge Summary (Signed)
Physician Discharge Summary Note  Patient:  Danielle [name] is an 47 y.o., female MRN:  [id number] DOB:  09-Feb-1971 Patient phone:  [phone] (home)  Patient address:   [address] [place] [address],  Total Time spent with patient: 20 minutes  Plus 20 minutes of medication reconciliation, discharge planning, and discharge documentation   Date of Admission:  02/27/2018 Date of Discharge: 03/10/18  Reason for Admission:  Patient transferred to [place] from the hospital in [place].  Her usual psychiatrist, Dr. Lolly Mustache, I had recommended that she come to the hospital because of worsening symptoms of psychosis and depression along with suicidal ideation.  Patient reports she has been struggling with auditory hallucinations for years but that for the last couple weeks things have been getting even worse.  Hallucinations are loud and intrusive most of the day.  They say negative things to her including making ugly insults and telling her to kill her self.  Patient has been having suicidal thoughts with thoughts of overdosing but has resisted it.  Mood is very depressed and anxious and somewhat hopeless.  Denies that she has been abusing substances.  Says that she has been fully compliant with prescribed medicines.  Cites multiple recent stresses including anniversaries of her father's death and also the fact that her grandchildren are in DSS custody and that her daughter is in jail.  Principal Problem: Paranoid schizophrenia [facility]) Discharge Diagnoses: Patient Active Problem List   Diagnosis Date Noted  . Borderline personality disorder (HCC) [F60.3] 03/06/2018    Priority: High  . 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 Suicidal ideation [R45.851] 02/28/2018  . 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 Frequent falls [R29.6] 06/18/2015  . Tobacco use disorder [F17.200] 06/08/2015  . Hyperammonemia (HCC) [E72.20] 06/08/2015  . Paranoid  schizophrenia (HCC) [F20.0]   . Migraine headache [G43.909] 06/04/2012  . Seizure (HCC) [R56.9] 06/04/2012  . Hypothyroidism [E03.9] 06/04/2012  . OSA (obstructive sleep apnea) [G47.33] 08/29/2011  . Post traumatic stress disorder (PTSD) [F43.10] 07/31/2011    Past Psychiatric History: See H&P  Past Medical History:  Past Medical History:  Diagnosis Date  . Anemia   . Anxiety   . Bipolar 1 disorder (HCC)   . Depressed   . Headache(784.0)   . Heart murmur   . Hypothyroidism   . PTSD (post-traumatic stress disorder)   . Schizophrenia (HCC)   . Seizures (HCC)   . Shortness of breath     Past Surgical History:  Procedure Laterality Date  . FOOT SURGERY    . LEG SURGERY    . NASAL FRACTURE SURGERY    . TUBAL LIGATION  1996   Family History:  Family History  Adopted: Yes  Problem Relation Age of Onset  . Drug abuse Daughter        heroin addiction   Family Psychiatric  History: See H&P Social History:  Social History   Substance and Sexual Activity  Alcohol Use No  . Alcohol/week: 0.0 standard drinks     Social History   Substance and Sexual Activity  Drug Use No    Social History   Socioeconomic History  . Marital status: Married    Spouse name: Not on file  . Number of children: 2  . Years of education: Not on file  . Highest education level: Not on file  Occupational History  . Occupation: unemployed    Associate Professor: NOT EMPLOYED  Social Needs  . Financial resource strain: Not  on file  . Food insecurity:    Worry: Not on file    Inability: Not on file  . Transportation needs:    Medical: Not on file    Non-medical: Not on file  Tobacco Use  . Smoking status: Former Smoker    Packs/day: 1.50    Years: 30.00    Pack years: 45.00    Types: Cigarettes    Last attempt to quit: 09/20/2011    Years since quitting: 6.4  . Smokeless tobacco: Current User  Substance and Sexual Activity  . Alcohol use: No    Alcohol/week: 0.0 standard drinks  . Drug  use: No  . Sexual activity: Yes    Partners: Male    Birth control/protection: Surgical  Lifestyle  . Physical activity:    Days per week: Not on file    Minutes per session: Not on file  . Stress: Not on file  Relationships  . Social connections:    Talks on phone: Not on file    Gets together: Not on file    Attends religious service: Not on file    Active member of club or organization: Not on file    Attends meetings of clubs or organizations: Not on file    Relationship status: Not on file  Other Topics Concern  . Not on file  Social History Narrative   Pt is adopted. Unsure of family history.    Hospital Course:  Pt was started on Clozapine while in the hospital.She has had severely medication trials with not much benefit. She does have history of trauma and reviewing her history I do feel there is a component of complex PTSD and borderline personality traits which have caused self depreciating AH as opposed to actual psychotic hallucinations. She has not had much benefit from many antipsychotic medications. She is organized in her thoughts.  She tolerated Clozapine well and ANC remained WNL. Qtc was prolonged at initial part of hospitalization but repeat EKG showed QTc 355.  of She was started on Remeron for insomnia and PTSD. She was restarted on Klonopin which she is on at home but this was lowered to 0.25 mg QID prn as she has OSA and benzos are not the ideal choice. She was initially restarted back on Lithium at 300 mg BID however, she did develop tremor so this was held. It was decided to discontinue all together due to polypharmacy. She was continued on Lamictal 50 mg BID and Trokendi which her husband stated she is on for pseudoseizures. She tolerated all of these changes well. Her next CBC is due on 03/17/18. She was referred to ACT team through [facility] in [facility] and confirmed a referral was made on 02/17/18.> CSW attempted to reach out to them but did not receive return  call.   I continue to feel that   these "Voices" are self depreciating self talk related to complex PTSD and borderline personality disorder. They are atypical for a primary psychotic picture as they only come and go during times of stress. They have also been refractory to most anti-psychotic treatment she has tried. She was observed at times during hospitalization with her hands on her ears complaining of voices but when distracted she is talking and laughing with peers.  She is very organized in her thought process, she has full affect and no delay in her responses.She talks about how lonely she is at home and her husband works all the time. It appears she has "learned"  behaviors to help get her the attention she craves such as hearing voices, suicidal thoughts and gestures and history of nonepileptic seizures. These symptoms have gained her significant attention from her husband and her providers.  Hospitalization is reinforcing to her because she does get a lot of attention with these "voices."  Would evaluate the need for hospitalization when she does return to the ED. Nonetheless, she could benefit from short hospitalizations in the future  in times of crisis but would keep them short due to reinforcing nature of hospitalizations. She has had serotinous suicide attempts in the past and is at high chronic risk of suicide.   On day of discharge, she reported feeling much better. She states that the voices are still there but "much more manageable.' She states that they are less negative.  She states that they are not loud and distracting anymore. She denies feeling suicidal at all. Her affect is bright and she does not feel depressed. She is excited to go home and see her husband. She is future oriented and wants to plan a date night with her husband.  She plans to continue with her therapists recommendation to get out of the house twice a day. She states that she wants to return home and feels safe doing so.  She reports that she is sleeping much better. She states, "That was the best sleep I"ve had in a long time." She states that the nightmares are much less frequent and intrusive.   Physical Findings: AIMS: Facial and Oral Movements Muscles of Facial Expression: None, normal Lips and Perioral Area: None, normal Jaw: None, normal Tongue: None, normal,Extremity Movements Upper (arms, wrists, hands, fingers): None, normal Lower (legs, knees, ankles, toes): None, normal, Trunk Movements Neck, shoulders, hips: None, normal, Overall Severity Severity of abnormal movements (highest score from questions above): None, normal Incapacitation due to abnormal movements: None, normal Patient's awareness of abnormal movements (rate only patient's report): No Awareness, Dental Status Current problems with teeth and/or dentures?: No Does patient usually wear dentures?: No  CIWA:  CIWA-Ar Total: 2 COWS:  COWS Total Score: 2  Musculoskeletal: Strength & Muscle Tone: within normal limits Gait & Station: normal Patient leans: N/A  Psychiatric Specialty Exam: Physical Exam  Nursing note and vitals reviewed.   Review of Systems  All other systems reviewed and are negative.   Blood pressure (!) 103/56, pulse 80, temperature 97.9 F (36.6 C), temperature source Oral, resp. rate 18, height 5\' 9"  (1.753 m), weight 120.2 kg, SpO2 90 %.Body mass index is 39.13 kg/m.  General Appearance: Casual  Eye Contact:  Good  Speech:  Clear and Coherent  Volume:  Normal  Mood:  Euthymic  Affect:  Appropriate  Thought Process:  Coherent and Goal Directed  Orientation:  Full (Time, Place, and Person)  Thought Content:  Logical  Suicidal Thoughts:  No  Homicidal Thoughts:  No  Memory:  Immediate;   Fair  Judgement:  Fair  Insight:  Fair  Psychomotor Activity:  Normal  Concentration:  Concentration: Fair  Recall:  [employer] of Knowledge:  Fair  Language:  Fair  Akathisia:  No      Assets:  Resilience   ADL's:  Intact  Cognition:  WNL  Sleep:  Number of Hours: 7.45     Have you used any form of tobacco in the last 30 days? (Cigarettes, Smokeless Tobacco, Cigars, and/or Pipes): Yes  Has this patient used any form of tobacco in the last 30 days? (Cigarettes,  Smokeless Tobacco, Cigars, and/or Pipes) Yes, Yes, A prescription for an FDA-approved tobacco cessation medication was offered at discharge and the patient refused  Blood Alcohol level:  Lab Results  Component Value Date   [facility] <10 02/27/2018   ETH <5 08/06/2016    Metabolic Disorder Labs:  Lab Results  Component Value Date   HGBA1C 5.8 (H) 02/28/2018   MPG 119.76 02/28/2018   MPG 123 (H) 02/05/2012   Lab Results  Component Value Date   PROLACTIN 39.1 (H) 06/08/2015   Lab Results  Component Value Date   CHOL 242 (H) 02/28/2018   TRIG 728 (H) 02/28/2018   HDL 32 (L) 02/28/2018   CHOLHDL 7.6 02/28/2018   VLDL UNABLE TO CALCULATE IF TRIGLYCERIDE OVER 400 mg/dL 16/02/9603   LDLCALC UNABLE TO CALCULATE IF TRIGLYCERIDE OVER 400 mg/dL 54/01/8118   LDLCALC 147 (H) 06/08/2015    See Psychiatric Specialty Exam and Suicide Risk Assessment completed by Attending Physician prior to discharge.  Discharge destination:  Home  Is patient on multiple antipsychotic therapies at discharge:  No   Has Patient had three or more failed trials of antipsychotic monotherapy by history:  No  Recommended Plan for Multiple Antipsychotic Therapies: NA  Discharge Instructions    Diet - low sodium heart healthy   Complete by:  As directed    Increase activity slowly   Complete by:  As directed      Allergies as of 03/10/2018      Reactions   Tomato Rash   Pt reports that she breaks out in a rash if she eats tomato based foods but can eat a whole tomato   Imitrex [sumatriptan Base] Hives   Mustard Seed Rash   Also allergic to Mayo Other reaction(s): Other (See Comments) UTA      Medication List    STOP taking these medications    acetaminophen 650 MG CR tablet Commonly known as:  TYLENOL   aspirin EC 81 MG tablet   ibuprofen 800 MG tablet Commonly known as:  ADVIL,MOTRIN   lithium carbonate 450 MG CR tablet Commonly known as:  ESKALITH   loxapine 50 MG capsule Commonly known as:  LOXITANE   PARoxetine 37.5 MG 24 hr tablet Commonly known as:  PAXIL-CR     TAKE these medications     Indication  clonazePAM 0.5 MG tablet Commonly known as:  KLONOPIN Take 0.5 tablets (0.25 mg total) by mouth 4 (four) times daily as needed for anxiety. What changed:  how much to take  Indication:  Panic Disorder   cloZAPine 25 MG tablet Commonly known as:  CLOZARIL Take 1-5 tablets (25-125 mg total) by mouth 2 (two) times daily. Take 25 mg in the morning and 125 mg at bedtime Notes to patient:  You will take 25 mg (1 tablet) in the morning) and 125 mg (5 tablets) at bedtime You will need to get weekly blood draws done with this medication. Next blood draw due on 03/17/18  Indication:  Schizophrenia that does Not Respond to Usual Drug Therapy   diltiazem 120 MG 24 hr capsule Commonly known as:  DILACOR XR Take 120 mg by mouth daily.  Indication:  Hypertrophic Cardiomyopathy   eletriptan 40 MG tablet Commonly known as:  RELPAX TAKE 1 TAB AT ONSET OF HEADACHES. MAY REPEAT IN 2 HOURS.MAX 2/24 HRS. FOR 30 DAYS  Indication:  Migraine Headache   folic acid 1 MG tablet Commonly known as:  FOLVITE Take 1 tablet (1 mg total) by mouth  daily.  Indication:  Deficiency of Folic Acid   hydrochlorothiazide 12.5 MG tablet Commonly known as:  HYDRODIURIL TAKE ONE TABLET (12.5 MG DOSE) BY MOUTH DAILY. TAKE ON TABLET BY MOUTH ONCE A DAY IN THE MORNING.  Indication:  High Blood Pressure Disorder   lamoTRIgine 25 MG tablet Commonly known as:  LAMICTAL Take 2 tablets (50 mg total) by mouth 2 (two) times daily.  Indication:  Pseudoseizuers, mood   levothyroxine 100 MCG tablet Commonly known as:  SYNTHROID, LEVOTHROID TAKE ONE  TABLET (100 MCG DOSE) BY MOUTH DAILY.  Indication:  Underactive Thyroid   mirtazapine 15 MG tablet Commonly known as:  REMERON Take 1 tablet (15 mg total) by mouth at bedtime.  Indication:  Depression, PTSD   POTASSIUM PO Take by mouth.  Indication:  p   prazosin 2 MG capsule Commonly known as:  MINIPRESS Take 1 capsule (2 mg total) by mouth at bedtime.  Indication:  Nightmares   TROKENDI XR 100 MG Cp24 Generic drug:  Topiramate ER Take 1 tablet by mouth daily.  Indication:  Pseudoseizures   TROKENDI XR 200 MG Cp24 Generic drug:  Topiramate ER Take 1 tablet by mouth daily.  Indication:  Peudoseizrues   Vitamin D (Ergocalciferol) 50000 units Caps capsule Commonly known as:  DRISDOL Take 50,000 Units by mouth every 7 (seven) days. saturday  Indication:  Vitamin D Deficiency      Follow-up Information    Cleotis Nipper, MD. Go on 04/21/2018.   Specialty:  Psychiatry Why:  Please follow up with Dr. Kathryne Sharper on Tuesday April 21, 2018 at 2:20pm. Thank you. Contact information: [address] [place] [address] [phone]        [facility] Follow up.   Why:  The ACTT team should be contacting you soon to set up services. A referral was already made on Octover 15, 2019. Thank you. Contact information: [address] [place] [address] [phone]        [facility] [place] Follow up.   Why:  If you feel that you need to be seen sooner than your scheduled appointment. Please follow up at this location. Thank you. Contact information: [address] [place],  [place]  [phone]  Sunday:Closed Monday:8:00 AM - 4:30 PM Tuesday:8:00 AM - 4:30 PM Wednesday:8:00 AM - 4:30 PM Thursday:8:00 AM - 4:30 PM Friday:8:00 AM - 1:00 PM Saturday:Closed  Main: [phone]           Signed: Haskell Riling, MD 03/10/2018, 9:43 AM

## 2018-03-10 NOTE — Progress Notes (Signed)
Recreation Therapy Notes  INPATIENT RECREATION TR PLAN  Patient Details Name: Danielle [name] MRN: [id number] DOB: December 24, 1970 Today's Date: 03/10/2018  Rec Therapy Plan Is patient appropriate for Therapeutic Recreation?: Yes Treatment times per week: at least 3 Estimated Length of Stay: 5-7 days TR Treatment/Interventions: Group participation (Comment)  Discharge Criteria Pt will be discharged from therapy if:: Discharged Treatment plan/goals/alternatives discussed and agreed upon by:: Patient/family  Discharge Summary Short term goals set: Patient will identify 3 benefits of self-care within 5 recreation therapy group sessions Short term goals met: Not met Progress toward goals comments: Groups attended Which groups?: Other (Comment)(Team work) Reason goals not met: Patient spent most of her time in her room Therapeutic equipment acquired: n/a Reason patient discharged from therapy: Discharge from hospital Pt/family agrees with progress & goals achieved: Yes Date patient discharged from therapy: 03/17/18   Chera Slivka 03/10/2018, 3:33 PM

## 2018-03-10 NOTE — BHH Suicide Risk Assessment (Signed)
[facility] Discharge Suicide Risk Assessment   Principal Problem: Paranoid schizophrenia [facility]) Discharge Diagnoses:  Patient Active Problem List   Diagnosis Date Noted  . Borderline personality disorder (HCC) [F60.3] 03/06/2018    Priority: High  . 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 Suicidal ideation [R45.851] 02/28/2018  . 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 Frequent falls [R29.6] 06/18/2015  . Tobacco use disorder [F17.200] 06/08/2015  . Hyperammonemia (HCC) [E72.20] 06/08/2015  . Paranoid schizophrenia (HCC) [F20.0]   . Migraine headache [G43.909] 06/04/2012  . Seizure (HCC) [R56.9] 06/04/2012  . Hypothyroidism [E03.9] 06/04/2012  . OSA (obstructive sleep apnea) [G47.33] 08/29/2011  . Post traumatic stress disorder (PTSD) [F43.10] 07/31/2011    Mental Status Per Nursing Assessment::   On Admission:  Self-harm thoughts  Demographic Factors:  Caucasian and Unemployed  Loss Factors: NA  Historical Factors: Prior suicide attempts  Risk Reduction Factors:   Living with another person, especially a relative, Positive social support, Positive therapeutic relationship and Positive coping skills or problem solving skills  Continued Clinical Symptoms:  Personality Disorders:   Cluster B More than one psychiatric diagnosis Previous Psychiatric Diagnoses and Treatments Medical Diagnoses and Treatments/Surgeries  Cognitive Features That Contribute To Risk:  None    Suicide Risk:  Minimal Acute Risk: No identifiable suicidal ideation.  Chronic Elevated risk  Follow-up Information    Cleotis Nipper, MD. Go on 04/21/2018.   Specialty:  Psychiatry Why:  Please follow up with Dr. Kathryne Sharper on Tuesday April 21, 2018 at 2:20pm. Thank you. Contact information: [address] [place] [address] [phone]        [facility] Follow up.   Why:  The ACTT team should be contacting you soon to set up services. A  referral was already made on Octover 15, 2019. Thank you. Contact information: [name] [place] [address] [phone]           Plan Of Care/Follow-up recommendations:  Next blood draw due on 03/17/18  Haskell Riling, MD 03/10/2018, 9:39 AM

## 2018-03-10 NOTE — Progress Notes (Signed)
  [facility] Adult Case Management Discharge Plan :  Will you be returning to the same living situation after discharge:  Yes,  Home with husband At discharge, do you have transportation home?: Yes,  Family coming at discharge  Do you have the ability to pay for your medications: Yes,  Insurance  Release of information consent forms completed and in the chart;  Patient's signature needed at discharge.  Patient to Follow up at: Follow-up Information    Cleotis Nipper, MD. Go on 04/21/2018.   Specialty:  Psychiatry Why:  Please follow up with Dr. Kathryne Sharper on Tuesday April 21, 2018 at 2:20pm. Thank you. Contact information: [address] [place] [address] [phone]        [facility] Follow up.   Why:  The ACTT team should be contacting you soon to set up services. A referral was already made on Octover 15, 2019. Thank you. Contact information: [address] [place] [address] [phone]        [facility] [place] Follow up.   Why:  If you feel that you need to be seen sooner than your scheduled appointment. Please follow up at this location. Thank you. Contact information: [address] [place],  [place]  [phone]  Sunday:Closed Monday:8:00 AM - 4:30 PM Tuesday:8:00 AM - 4:30 PM Wednesday:8:00 AM - 4:30 PM Thursday:8:00 AM - 4:30 PM Friday:8:00 AM - 1:00 PM Saturday:Closed  Main: [phone]          Next level of care provider has access to [facility] Link:no  Safety Planning and Suicide Prevention discussed: Yes,  Completed with patient and Minka Knight, husband [phone]   Have you used any form of tobacco in the last 30 days? (Cigarettes, Smokeless Tobacco, Cigars, and/or Pipes): Yes  Has patient been referred to the Quitline?: Patient refused referral  Patient has been referred for addiction treatment: N/A  Johny Shears, LCSW 03/10/2018, 9:42 AM

## 2018-03-10 NOTE — BHH Group Notes (Signed)
[facility] Group Notes:  (Nursing/MHT/Case Management/Adjunct)  Date:  03/10/2018  Time:  9:41 AM  Type of Therapy:  Psychoeducational Skills  Participation Level:  Did Not Attend  PJasmine R Chelly Dombeck 03/10/2018, 9:41 AM

## 2018-03-10 NOTE — Progress Notes (Signed)
Recreation Therapy Notes  Date: 03/10/2018  Time: 9:30 am   Location: Craft room   Behavioral response: N/A   Intervention Topic: Goals  Discussion/Intervention: Patient did not attend group.   Clinical Observations/Feedback:  Patient did not attend group.   Dell Briner LRT/CTRS        Haris Baack 03/10/2018 10:44 AM

## 2018-03-18 ENCOUNTER — Encounter (HOSPITAL_COMMUNITY): Payer: Self-pay | Admitting: Licensed Clinical Social Worker

## 2018-03-18 ENCOUNTER — Other Ambulatory Visit (HOSPITAL_COMMUNITY): Payer: Self-pay

## 2018-03-18 ENCOUNTER — Ambulatory Visit (INDEPENDENT_AMBULATORY_CARE_PROVIDER_SITE_OTHER): Payer: BLUE CROSS/BLUE SHIELD | Admitting: Licensed Clinical Social Worker

## 2018-03-18 DIAGNOSIS — F200 Paranoid schizophrenia: Secondary | ICD-10-CM | POA: Diagnosis not present

## 2018-03-18 DIAGNOSIS — F4310 Post-traumatic stress disorder, unspecified: Secondary | ICD-10-CM | POA: Diagnosis not present

## 2018-03-18 DIAGNOSIS — Z79899 Other long term (current) drug therapy: Secondary | ICD-10-CM
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
## 2018-03-19 NOTE — Progress Notes (Signed)
She is on clozapine which was started on her last hospitalization.  She will required CBC regularly to monitor her WBC and absolute neutrophil count.  She will also require clozapine level monthly to avoid any toxicity from clozapine.

## 2018-03-20 LAB — CBC WITH DIFFERENTIAL/PLATELET
Basophils Absolute: 58 cells/uL (ref 0–200)
Basophils Relative: 0.6 %
EOS PCT: 1.6 %
Eosinophils Absolute: 155 cells/uL (ref 15–500)
HCT: 39.5 % (ref 35.0–45.0)
Hemoglobin: 13.5 g/dL (ref 11.7–15.5)
Lymphs Abs: 1959 cells/uL (ref 850–3900)
MCH: 31.8 pg (ref 27.0–33.0)
MCHC: 34.2 g/dL (ref 32.0–36.0)
MCV: 93.2 fL (ref 80.0–100.0)
MONOS PCT: 4.8 %
MPV: 9.7 fL (ref 7.5–12.5)
NEUTROS PCT: 72.8 %
Neutro Abs: 7062 cells/uL (ref 1500–7800)
Platelets: 388 10*3/uL (ref 140–400)
RBC: 4.24 10*6/uL (ref 3.80–5.10)
RDW: 13.3 % (ref 11.0–15.0)
TOTAL LYMPHOCYTE: 20.2 %
WBC: 9.7 10*3/uL (ref 3.8–10.8)
WBCMIX: 466 {cells}/uL (ref 200–950)

## 2018-03-23 LAB — CLOZAPINE (CLOZARIL)
CLOZAPINE LVL: 295 ug/L
NorClozapine: 129 mcg/L (ref 25–400)

## 2018-03-25 ENCOUNTER — Other Ambulatory Visit (HOSPITAL_COMMUNITY): Payer: Self-pay

## 2018-03-25 MED ORDER — CLOZAPINE 25 MG PO TABS: ORAL_TABLET | ORAL | 0 refills | Status: DC

## 2018-03-26 ENCOUNTER — Other Ambulatory Visit (HOSPITAL_COMMUNITY): Payer: Self-pay | Admitting: Psychiatry

## 2018-03-26 DIAGNOSIS — F4310 Post-traumatic stress disorder, unspecified: Secondary | ICD-10-CM

## 2018-04-06 ENCOUNTER — Other Ambulatory Visit (HOSPITAL_COMMUNITY): Payer: Self-pay

## 2018-04-06 MED ORDER — MIRTAZAPINE 15 MG PO TABS: 15.0000 mg | ORAL_TABLET | Freq: Every day | ORAL | 0 refills | Status: DC

## 2018-04-07 ENCOUNTER — Other Ambulatory Visit (HOSPITAL_COMMUNITY): Payer: Self-pay

## 2018-04-10 ENCOUNTER — Other Ambulatory Visit (HOSPITAL_COMMUNITY): Payer: Self-pay

## 2018-04-10 DIAGNOSIS — Z79899 Other long term (current) drug therapy: Secondary | ICD-10-CM

## 2018-04-13 ENCOUNTER — Other Ambulatory Visit (HOSPITAL_COMMUNITY): Payer: Self-pay | Admitting: Psychiatry

## 2018-04-16 ENCOUNTER — Ambulatory Visit (INDEPENDENT_AMBULATORY_CARE_PROVIDER_SITE_OTHER): Payer: BLUE CROSS/BLUE SHIELD | Admitting: Licensed Clinical Social Worker

## 2018-04-16 ENCOUNTER — Encounter (HOSPITAL_COMMUNITY): Payer: Self-pay | Admitting: Licensed Clinical Social Worker

## 2018-04-16 DIAGNOSIS — F4310 Post-traumatic stress disorder, unspecified: Secondary | ICD-10-CM | POA: Diagnosis not present

## 2018-04-16 DIAGNOSIS — F200 Paranoid schizophrenia: Secondary | ICD-10-CM

## 2018-04-16 LAB — CLOZAPINE (CLOZARIL)
CLOZAPINE LVL: 256 ug/L
NORCLOZAPINE: 129 ug/L (ref 25–400)

## 2018-04-16 LAB — CBC WITH DIFFERENTIAL/PLATELET
BASOS PCT: 0.6 %
Basophils Absolute: 52 cells/uL (ref 0–200)
Eosinophils Absolute: 86 cells/uL (ref 15–500)
Eosinophils Relative: 1 %
HCT: 39.2 % (ref 35.0–45.0)
Hemoglobin: 13.2 g/dL (ref 11.7–15.5)
Lymphs Abs: 1634 cells/uL (ref 850–3900)
MCH: 31.7 pg (ref 27.0–33.0)
MCHC: 33.7 g/dL (ref 32.0–36.0)
MCV: 94 fL (ref 80.0–100.0)
MPV: 10.2 fL (ref 7.5–12.5)
Monocytes Relative: 4.2 %
NEUTROS PCT: 75.2 %
Neutro Abs: 6467 cells/uL (ref 1500–7800)
PLATELETS: 308 10*3/uL (ref 140–400)
RBC: 4.17 10*6/uL (ref 3.80–5.10)
RDW: 12.7 % (ref 11.0–15.0)
TOTAL LYMPHOCYTE: 19 %
WBC: 8.6 10*3/uL (ref 3.8–10.8)
WBCMIX: 361 {cells}/uL (ref 200–950)

## 2018-04-16 NOTE — Progress Notes (Signed)
   THERAPIST PROGRESS NOTE  Session Time: 8:00am-8:45am  Participation Level: Active  Behavioral Response: DisheveledAlertAnxious  Type of Therapy: Individual Therapy  Treatment Goals addressed: Improve Psychiatric Symptoms, Emotional Regulation Skills, Calming Skills, Healthy Coping Skills, discuss and process traumatic event, reduced irrational worries and fears  Interventions: Motivational Interviewing, Cognitive Behavior Therapy and Grounding/Mindfulness Skills, psychoeducation  Summary: GEORGENE KOPPER is a 47 y.o. female who presents with Schizophrenia and PTSD.   Suicidal/Homicidal: No - without intent/plan  Therapist Response:  Marlow met with clinician for an individual session. Aeralyn's husband joined the session to support her. Flois discussed her psychiatric symptoms, her current life events and her homework. Allanna shared that she received contact from [facility] and had an evaluation/assessment. She reports they called yesterday and she will call back to complete admission process. Clinician explored sxs and noted ongoing paranoia and anxiety. She also identified some sense of security, as her daughter is incarcerated and she knows she is safe. Terriana reports that after daughter is released, she will come home to live with them. Clinician explored the pros and cons of this decision. Jhoanna identified that she will feel more comfortable knowing what her daughter is doing and where she is.  Husband called after the appointment to report [facility] denied ACTT services due to them having private insurance. Clinician contacted [facility] for support and will follow up with [facility] and [facility] to see if they accept private insurance.   Plan: Return again in 2 weeks.  Diagnosis:     Axis I: Schizophrenia, paranoid type and PTSD  [name], LCSW 04/16/2018

## 2018-04-21 ENCOUNTER — Ambulatory Visit (INDEPENDENT_AMBULATORY_CARE_PROVIDER_SITE_OTHER): Payer: BLUE CROSS/BLUE SHIELD | Admitting: Psychiatry

## 2018-04-21 ENCOUNTER — Other Ambulatory Visit (HOSPITAL_COMMUNITY): Payer: Self-pay | Admitting: Psychiatry

## 2018-04-21 ENCOUNTER — Encounter (HOSPITAL_COMMUNITY): Payer: Self-pay | Admitting: Psychiatry

## 2018-04-21 VITALS — BP 119/80 | Ht 71.0 in | Wt 251.0 lb

## 2018-04-21 DIAGNOSIS — F4310 Post-traumatic stress disorder, unspecified: Secondary | ICD-10-CM

## 2018-04-21 DIAGNOSIS — F200 Paranoid schizophrenia: Secondary | ICD-10-CM | POA: Diagnosis not present

## 2018-04-21 MED ORDER — MIRTAZAPINE 30 MG PO TABS: 30.0000 mg | ORAL_TABLET | Freq: Every day | ORAL | 1 refills | Status: DC

## 2018-04-21 MED ORDER — CLOZAPINE 25 MG PO TABS: ORAL_TABLET | ORAL | 0 refills | Status: DC

## 2018-04-21 MED ORDER — LAMOTRIGINE 25 MG PO TABS: 50.0000 mg | ORAL_TABLET | Freq: Two times a day (BID) | ORAL | 0 refills | Status: DC

## 2018-04-21 MED ORDER — PRAZOSIN HCL 2 MG PO CAPS: 2.0000 mg | ORAL_CAPSULE | Freq: Every day | ORAL | 1 refills | Status: DC

## 2018-04-21 MED ORDER — CLONAZEPAM 0.5 MG PO TABS: 0.5000 mg | ORAL_TABLET | Freq: Three times a day (TID) | ORAL | 1 refills | Status: DC | PRN

## 2018-04-21 NOTE — Progress Notes (Signed)
BH MD/PA/NP OP Progress Note  04/21/2018 3:12 PM Danielle [name]  MRN:  [id number]  Chief Complaint: I am feeling better with the medication.  I still have sleep issues and some nights I have nightmare.  HPI: Danielle [name] came for her follow-up visit with her husband.  She was recently discharged from [facility].  She was last seen in October and having suicidal thoughts and plan to kill herself and recommended inpatient services.  In the hospital she was started clozapine and her lithium, Paxil and loxapine was discontinued.  Her Klonopin was also reduced and Remeron was restarted.  She still having issues with insomnia.  She still having hallucination, paranoia but she feels that new medicine is helping the symptoms.  She is getting blood work every week.  Her WBC count and absolute neutrophil count is normal.  Her clozapine level is also therapeutic.  She has no drooling.  Her daughter is in jail and they have decided not to bail her out.  Patient told her daughter's court date is in January.  Patient told her grand kids are under the custody of DSS.  Her son is living with the girlfriend and there has been no contact.  Her only support is her husband who is very cooperative.  Patient does not leave her house without him.  She gets very scared and hypervigilant at public place.  She is seeing Janett Billow for therapy.  Her appetite is okay.  She has difficulty walking because of the foot pain.  She has recently procedure and her rod, right leg is removed.  She is hoping to get better in few weeks.  Visit Diagnosis:    ICD-10-CM   1. Paranoid schizophrenia ([place]) F20.0 mirtazapine (REMERON) 30 MG tablet    cloZAPine (CLOZARIL) 25 MG tablet  2. PTSD (post-traumatic stress disorder) F43.10 prazosin (MINIPRESS) 2 MG capsule    mirtazapine (REMERON) 30 MG tablet    cloZAPine (CLOZARIL) 25 MG tablet    clonazePAM (KLONOPIN) 0.5 MG tablet    Past Psychiatric History: Reviewed. History of multiple  hospitalization because of multiple suicidal attempt, psychosis, paranoia, hallucination and severe depression.  She has history of cutting herself and drowning herself.  Her last admission was in October 2019 at [facility]. In the past she tried multiple psychiatric medication includes Zoloft, amitriptyline, Wellbutrin, Latuda, Xanax, Valium, Risperdal, lithium, Haldol, Depakote, Zyprexa, Geodon, imipramine, InVega, temazepam and Paxil, lithium, loxapine.  Have referred her to ACT team but due to husband making more money she was not approved.  Past Medical History:  Past Medical History:  Diagnosis Date  . Anemia   . Anxiety   . Bipolar 1 disorder ([place])   . Depressed   . Headache(784.0)   . Heart murmur   . Hypothyroidism   . PTSD (post-traumatic stress disorder)   . Schizophrenia ([place])   . Seizures ([place])   . Shortness of breath     Past Surgical History:  Procedure Laterality Date  . FOOT SURGERY Right    Rod removed  . LEG SURGERY    . NASAL FRACTURE SURGERY    . TUBAL LIGATION  1996    Family Psychiatric History: Reviewed.  Family History:  Family History  Adopted: Yes  Problem Relation Age of Onset  . Drug abuse Daughter        heroin addiction    Social History:  Social History   Socioeconomic History  . Marital status: Married    Spouse name: Not  on file  . Number of children: 2  . Years of education: Not on file  . Highest education level: Not on file  Occupational History  . Occupation: unemployed    Fish farm manager: NOT EMPLOYED  Social Needs  . Financial resource strain: Not on file  . Food insecurity:    Worry: Not on file    Inability: Not on file  . Transportation needs:    Medical: Not on file    Non-medical: Not on file  Tobacco Use  . Smoking status: Current Every Day Smoker    Packs/day: 1.00    Years: 30.00    Pack years: 30.00    Types: Cigarettes    Last attempt to quit: 09/20/2011    Years since quitting: 6.5  . Smokeless  tobacco: Never Used  Substance and Sexual Activity  . Alcohol use: No    Alcohol/week: 0.0 standard drinks  . Drug use: No  . Sexual activity: Yes    Partners: Male    Birth control/protection: Surgical  Lifestyle  . Physical activity:    Days per week: Not on file    Minutes per session: Not on file  . Stress: Not on file  Relationships  . Social connections:    Talks on phone: Not on file    Gets together: Not on file    Attends religious service: Not on file    Active member of club or organization: Not on file    Attends meetings of clubs or organizations: Not on file    Relationship status: Not on file  Other Topics Concern  . Not on file  Social History Narrative   Pt is adopted. Unsure of family history.    Allergies:  Allergies  Allergen Reactions  . Tomato Rash    Pt reports that she breaks out in a rash if she eats tomato based foods but can eat a whole tomato  . Imitrex [Sumatriptan Base] Hives  . Mustard Seed Rash    Also allergic to Mayo Other reaction(s): Other (See Comments) UTA    Metabolic Disorder Labs: Recent Results (from the past 2160 hour(s))  Rapid urine drug screen (hospital performed)     Status: Abnormal   Collection Time: 02/27/18  3:04 PM  Result Value Ref Range   Opiates NONE DETECTED NONE DETECTED   Cocaine NONE DETECTED NONE DETECTED   Benzodiazepines NONE DETECTED NONE DETECTED   Amphetamines NONE DETECTED NONE DETECTED   Tetrahydrocannabinol NONE DETECTED NONE DETECTED   Barbiturates POSITIVE (A) NONE DETECTED    Comment: (NOTE) DRUG SCREEN FOR MEDICAL PURPOSES ONLY.  IF CONFIRMATION IS NEEDED FOR ANY PURPOSE, NOTIFY LAB WITHIN 5 DAYS. LOWEST DETECTABLE LIMITS FOR URINE DRUG SCREEN Drug Class                     Cutoff (ng/mL) Amphetamine and metabolites    1000 Barbiturate and metabolites    200 Benzodiazepine                 099 Tricyclics and metabolites     300 Opiates and metabolites        300 Cocaine and  metabolites        300 THC                            50 Performed at [facility], [place] [address], [place], [place] [address]   Comprehensive metabolic panel  Status: Abnormal   Collection Time: 02/27/18  5:10 PM  Result Value Ref Range   Sodium 136 135 - 145 mmol/L   Potassium 4.2 3.5 - 5.1 mmol/L    Comment: MODERATE HEMOLYSIS   Chloride 101 98 - 111 mmol/L   CO2 27 22 - 32 mmol/L   Glucose, Bld 96 70 - 99 mg/dL   BUN 10 6 - 20 mg/dL   Creatinine, Ser 1.11 (H) 0.44 - 1.00 mg/dL   Calcium 9.6 8.9 - 10.3 mg/dL   Total Protein 6.8 6.5 - 8.1 g/dL   Albumin 4.1 3.5 - 5.0 g/dL   AST 24 15 - 41 U/L   ALT 13 0 - 44 U/L   Alkaline Phosphatase 81 38 - 126 U/L   Total Bilirubin 1.5 (H) 0.3 - 1.2 mg/dL   GFR calc non Af Amer 57 (L) >60 mL/min   GFR calc Af Amer >60 >60 mL/min    Comment: (NOTE) The eGFR has been calculated using the CKD EPI equation. This calculation has not been validated in all clinical situations. eGFR's persistently <60 mL/min signify possible Chronic Kidney Disease.    Anion gap 8 5 - 15    Comment: Performed at [facility], [place] [address] [place], [place] [address]  Ethanol     Status: None   Collection Time: 02/27/18  5:10 PM  Result Value Ref Range   Alcohol, Ethyl (B) <10 <10 mg/dL    Comment: (NOTE) Lowest detectable limit for serum alcohol is 10 mg/dL. For medical purposes only. Performed at [facility], [place] [address], [place], [place] [address]   cbc     Status: Abnormal   Collection Time: 02/27/18  5:10 PM  Result Value Ref Range   WBC 14.0 (H) 4.0 - 10.5 K/uL   RBC 4.06 3.87 - 5.11 MIL/uL   Hemoglobin 12.8 12.0 - 15.0 g/dL   HCT 41.2 36.0 - 46.0 %   MCV 101.5 (H) 80.0 - 100.0 fL   MCH 31.5 26.0 - 34.0 pg   MCHC 31.1 30.0 - 36.0 g/dL   RDW 14.2 11.5 - 15.5 %   Platelets 393 150 - 400 K/uL   nRBC 0.0 0.0 - 0.2 %    Comment: Performed at [facility], [place]  [address], [place], [place] [address]  I-Stat beta hCG blood, ED     Status: None   Collection Time: 02/27/18  5:17 PM  Result Value Ref Range   I-stat hCG, quantitative <5.0 <5 mIU/mL   Comment 3            Comment:   GEST. AGE      CONC.  (mIU/mL)   <=1 WEEK        5 - 50     2 WEEKS       50 - 500     3 WEEKS       100 - 10,000     4 WEEKS     1,000 - 30,000        FEMALE AND NON-PREGNANT FEMALE:     LESS THAN 5 mIU/mL   Hemoglobin A1c     Status: Abnormal   Collection Time: 02/28/18  1:48 PM  Result Value Ref Range   Hgb A1c MFr Bld 5.8 (H) 4.8 - 5.6 %    Comment: (NOTE) Pre diabetes:          5.7%-6.4% Diabetes:              >  6.4% Glycemic control for   <7.0% adults with diabetes    Mean Plasma Glucose 119.76 mg/dL    Comment: Performed at [facility] [address] [place], [place] [address]  Lipid panel     Status: Abnormal   Collection Time: 02/28/18  1:48 PM  Result Value Ref Range   Cholesterol 242 (H) 0 - 200 mg/dL   Triglycerides 728 (H) <150 mg/dL   HDL 32 (L) >40 mg/dL   Total CHOL/HDL Ratio 7.6 RATIO   VLDL UNABLE TO CALCULATE IF TRIGLYCERIDE OVER 400 mg/dL 0 - 40 mg/dL   LDL Cholesterol UNABLE TO CALCULATE IF TRIGLYCERIDE OVER 400 mg/dL 0 - 99 mg/dL    Comment:        Total Cholesterol/HDL:CHD Risk Coronary Heart Disease Risk Table                     Men   Women  1/2 Average Risk   3.4   3.3  Average Risk       5.0   4.4  2 X Average Risk   9.6   7.1  3 X Average Risk  23.4   11.0        Use the calculated Patient Ratio above and the CHD Risk Table to determine the patient's CHD Risk.        ATP III CLASSIFICATION (LDL):  <100     mg/dL   Optimal  100-129  mg/dL   Near or Above                    Optimal  130-159  mg/dL   Borderline  160-189  mg/dL   High  >190     mg/dL   Very High Performed at [facility], [address] [place], [place] [address]   TSH     Status: None   Collection Time: 02/28/18  1:48 PM  Result Value  Ref Range   TSH 3.661 0.350 - 4.500 uIU/mL    Comment: Performed by a 3rd Generation assay with a functional sensitivity of <=0.01 uIU/mL. Performed at [facility], [place] [place], [place] [address]   CBC with Differential/Platelet     Status: Abnormal   Collection Time: 02/28/18  1:48 PM  Result Value Ref Range   WBC 12.7 (H) 4.0 - 10.5 K/uL   RBC 4.00 3.87 - 5.11 MIL/uL   Hemoglobin 12.8 12.0 - 15.0 g/dL   HCT 39.6 36.0 - 46.0 %   MCV 99.0 80.0 - 100.0 fL   MCH 32.0 26.0 - 34.0 pg   MCHC 32.3 30.0 - 36.0 g/dL   RDW 13.9 11.5 - 15.5 %   Platelets 420 (H) 150 - 400 K/uL   nRBC 0.0 0.0 - 0.2 %   Neutrophils Relative % 74 %   Neutro Abs 9.6 (H) 1.7 - 7.7 K/uL   Lymphocytes Relative 16 %   Lymphs Abs 2.0 0.7 - 4.0 K/uL   Monocytes Relative 6 %   Monocytes Absolute 0.7 0.1 - 1.0 K/uL   Eosinophils Relative 2 %   Eosinophils Absolute 0.2 0.0 - 0.5 K/uL   Basophils Relative 1 %   Basophils Absolute 0.1 0.0 - 0.1 K/uL   Immature Granulocytes 1 %   Abs Immature Granulocytes 0.15 (H) 0.00 - 0.07 K/uL    Comment: Performed at [facility], [address], [place], [place] [address]  Lithium level     Status:  None   Collection Time: 03/01/18  6:37 AM  Result Value Ref Range   Lithium Lvl 0.62 0.60 - 1.20 mmol/L    Comment: Performed at [facility], [address] [place], [place] [address]  Urinalysis, Complete w Microscopic     Status: Abnormal   Collection Time: 03/02/18 10:12 PM  Result Value Ref Range   Color, Urine YELLOW (A) YELLOW   APPearance TURBID (A) CLEAR   Specific Gravity, Urine 1.008 1.005 - 1.030   pH 6.0 5.0 - 8.0   Glucose, UA NEGATIVE NEGATIVE mg/dL   Hgb urine dipstick SMALL (A) NEGATIVE   Bilirubin Urine NEGATIVE NEGATIVE   Ketones, ur NEGATIVE NEGATIVE mg/dL   Protein, ur NEGATIVE NEGATIVE mg/dL   Nitrite POSITIVE (A) NEGATIVE   Leukocytes, UA LARGE (A) NEGATIVE   RBC / HPF 21-50 0 - 5 RBC/hpf   WBC, UA >50 (H) 0  - 5 WBC/hpf   Bacteria, UA FEW (A) NONE SEEN   Squamous Epithelial / LPF 6-10 0 - 5   WBC Clumps PRESENT     Comment: Performed at [facility], [address], [place], [place] [address]  Urine Culture     Status: Abnormal   Collection Time: 03/02/18 10:12 PM  Result Value Ref Range   Specimen Description      URINE, RANDOM Performed at [facility], [address] [place], [place] [address]    Special Requests      NONE Performed at [facility], [address] [place], [place] [address]    Culture >=100,000 COLONIES/mL KLEBSIELLA PNEUMONIAE (A)    Report Status 03/05/2018 FINAL    Organism ID, Bacteria KLEBSIELLA PNEUMONIAE (A)       Susceptibility   Klebsiella pneumoniae - MIC*    AMPICILLIN >=32 RESISTANT Resistant     CEFAZOLIN <=4 SENSITIVE Sensitive     CEFTRIAXONE <=1 SENSITIVE Sensitive     CIPROFLOXACIN <=0.25 SENSITIVE Sensitive     GENTAMICIN <=1 SENSITIVE Sensitive     IMIPENEM <=0.25 SENSITIVE Sensitive     NITROFURANTOIN 32 SENSITIVE Sensitive     TRIMETH/SULFA <=20 SENSITIVE Sensitive     AMPICILLIN/SULBACTAM 4 SENSITIVE Sensitive     PIP/TAZO <=4 SENSITIVE Sensitive     Extended ESBL NEGATIVE Sensitive     * >=100,000 COLONIES/mL KLEBSIELLA PNEUMONIAE  Glucose, capillary     Status: Abnormal   Collection Time: 03/04/18  6:36 PM  Result Value Ref Range   Glucose-Capillary 116 (H) 70 - 99 mg/dL  CBC with Differential/Platelet     Status: Abnormal   Collection Time: 03/07/18  7:10 AM  Result Value Ref Range   WBC 10.8 (H) 4.0 - 10.5 K/uL   RBC 3.92 3.87 - 5.11 MIL/uL   Hemoglobin 12.3 12.0 - 15.0 g/dL   HCT 38.9 36.0 - 46.0 %   MCV 99.2 80.0 - 100.0 fL   MCH 31.4 26.0 - 34.0 pg   MCHC 31.6 30.0 - 36.0 g/dL   RDW 13.8 11.5 - 15.5 %   Platelets 276 150 - 400 K/uL   nRBC 0.0 0.0 - 0.2 %   Neutrophils Relative % 76 %   Neutro Abs 8.3 (H) 1.7 - 7.7 K/uL   Lymphocytes Relative 15 %   Lymphs Abs 1.6 0.7 - 4.0 K/uL    Monocytes Relative 4 %   Monocytes Absolute 0.5 0.1 - 1.0 K/uL   Eosinophils Relative 2 %   Eosinophils Absolute 0.2 0.0 - 0.5 K/uL  Basophils Relative 1 %   Basophils Absolute 0.1 0.0 - 0.1 K/uL   Immature Granulocytes 2 %   Abs Immature Granulocytes 0.16 (H) 0.00 - 0.07 K/uL    Comment: Performed at [facility], [address] [place], [place] [address]  Lithium level     Status: None   Collection Time: 03/07/18  7:10 AM  Result Value Ref Range   Lithium Lvl 0.67 0.60 - 1.20 mmol/L    Comment: Performed at [facility], [address] [place], [place] [address]  Lithium level     Status: Abnormal   Collection Time: 03/08/18  7:36 PM  Result Value Ref Range   Lithium Lvl 0.59 (L) 0.60 - 1.20 mmol/L    Comment: Performed at [facility], [address] [place], [place] [address]  CBC with Differential/Platelet     Status: Abnormal   Collection Time: 03/10/18  6:53 AM  Result Value Ref Range   WBC 10.7 (H) 4.0 - 10.5 K/uL   RBC 3.84 (L) 3.87 - 5.11 MIL/uL   Hemoglobin 12.2 12.0 - 15.0 g/dL   HCT 38.0 36.0 - 46.0 %   MCV 99.0 80.0 - 100.0 fL   MCH 31.8 26.0 - 34.0 pg   MCHC 32.1 30.0 - 36.0 g/dL   RDW 13.7 11.5 - 15.5 %   Platelets 244 150 - 400 K/uL   nRBC 0.0 0.0 - 0.2 %   Neutrophils Relative % 74 %   Neutro Abs 7.9 (H) 1.7 - 7.7 K/uL   Lymphocytes Relative 17 %   Lymphs Abs 1.8 0.7 - 4.0 K/uL   Monocytes Relative 5 %   Monocytes Absolute 0.5 0.1 - 1.0 K/uL   Eosinophils Relative 3 %   Eosinophils Absolute 0.3 0.0 - 0.5 K/uL   Basophils Relative 0 %   Basophils Absolute 0.0 0.0 - 0.1 K/uL   Immature Granulocytes 1 %   Abs Immature Granulocytes 0.15 (H) 0.00 - 0.07 K/uL    Comment: Performed at [facility], [address] [place], [place] [address]  CBC with Differential     Status: None   Collection Time: 03/20/18  7:56 AM  Result Value Ref Range   WBC 9.7 3.8 - 10.8 Thousand/uL   RBC 4.24 3.80 - 5.10 Million/uL    Hemoglobin 13.5 11.7 - 15.5 g/dL   HCT 39.5 35.0 - 45.0 %   MCV 93.2 80.0 - 100.0 fL   MCH 31.8 27.0 - 33.0 pg   MCHC 34.2 32.0 - 36.0 g/dL   RDW 13.3 11.0 - 15.0 %   Platelets 388 140 - 400 Thousand/uL   MPV 9.7 7.5 - 12.5 fL   Neutro Abs 7,062 1,500 - 7,800 cells/uL   Lymphs Abs 1,959 850 - 3,900 cells/uL   WBC mixed population 466 200 - 950 cells/uL   Eosinophils Absolute 155 15 - 500 cells/uL   Basophils Absolute 58 0 - 200 cells/uL   Neutrophils Relative % 72.8 %   Total Lymphocyte 20.2 %   Monocytes Relative 4.8 %   Eosinophils Relative 1.6 %   Basophils Relative 0.6 %  Clozapine (clozaril)     Status: None   Collection Time: 03/20/18  7:58 AM  Result Value Ref Range   NorClozapine 129 25 - 400 mcg/L   Clozapine Lvl 295 mcg/L    Comment: . Reference Range for Clozapine: . The therapeutic response begins to appear at 100 mcg/L.  Refractory schizophrenia appears to require a therapeutic  concentration of at least 350 mcg/L (trough, at steady state). . Toxic range: Greater than 1000 mcg/L . This test was developed and its analytical performance characteristics have been determined by [place], [place]. It has not been cleared or approved by the U.S. Food and Drug Administration. This assay has been validated pursuant to the CLIA regulations and is used for clinical purposes. .   Clozapine (clozaril)     Status: None   Collection Time: 04/13/18  7:36 AM  Result Value Ref Range   NorClozapine 129 25 - 400 mcg/L   Clozapine Lvl 256 mcg/L    Comment: . Reference Range for Clozapine: . The therapeutic response begins to appear at 100 mcg/L.  Refractory schizophrenia appears to require a therapeutic concentration of at least 350 mcg/L (trough, at steady state). . Toxic range: Greater than 1000 mcg/L . This test was developed and its analytical performance characteristics have been determined by [place], [place]. It has not been cleared or approved by the U.S. Food and Drug Administration. This assay has been validated pursuant to the CLIA regulations and is used for clinical purposes. Marland Kitchen   CBC with Differential/Platelet     Status: None   Collection Time: 04/13/18  7:36 AM  Result Value Ref Range   WBC 8.6 3.8 - 10.8 Thousand/uL   RBC 4.17 3.80 - 5.10 Million/uL   Hemoglobin 13.2 11.7 - 15.5 g/dL   HCT 39.2 35.0 - 45.0 %   MCV 94.0 80.0 - 100.0 fL   MCH 31.7 27.0 - 33.0 pg   MCHC 33.7 32.0 - 36.0 g/dL   RDW 12.7 11.0 - 15.0 %   Platelets 308 140 - 400 Thousand/uL   MPV 10.2 7.5 - 12.5 fL   Neutro Abs 6,467 1,500 - 7,800 cells/uL   Lymphs Abs 1,634 850 - 3,900 cells/uL   WBC mixed population 361 200 - 950 cells/uL   Eosinophils Absolute 86 15 - 500 cells/uL   Basophils Absolute 52 0 - 200 cells/uL   Neutrophils Relative % 75.2 %   Total Lymphocyte 19.0 %   Monocytes Relative 4.2 %   Eosinophils Relative 1.0 %   Basophils Relative 0.6 %   Lab Results  Component Value Date   HGBA1C 5.8 (H) 02/28/2018   MPG 119.76 02/28/2018   MPG 123 (H) 02/05/2012   Lab Results  Component Value Date   PROLACTIN 39.1 (H) 06/08/2015   Lab Results  Component Value Date   CHOL 242 (H) 02/28/2018   TRIG 728 (H) 02/28/2018   HDL 32 (L) 02/28/2018   CHOLHDL 7.6 02/28/2018   VLDL UNABLE TO CALCULATE IF TRIGLYCERIDE OVER 400 mg/dL 02/28/2018   LDLCALC UNABLE TO CALCULATE IF TRIGLYCERIDE OVER 400 mg/dL 02/28/2018   LDLCALC 139 (H) 06/08/2015   Lab Results  Component Value Date   TSH 3.661 02/28/2018   TSH 2.250 12/30/2017    Therapeutic Level Labs: Lab Results  Component Value Date   LITHIUM 0.59 (L) 03/08/2018   LITHIUM 0.67 03/07/2018   Lab Results  Component Value Date   VALPROATE 12.7 06/10/2015   VALPROATE 62 06/08/2015   No components found for:  CBMZ  Current Medications: Current Outpatient Medications  Medication Sig Dispense Refill  . aspirin 81 MG chewable  tablet Chew 162 mg by mouth daily.    . clonazePAM (KLONOPIN) 0.5 MG tablet Take 1 tablet (0.5 mg total) by mouth 3 (three) times daily as needed for anxiety. [place]  tablet 1  . cloZAPine (CLOZARIL) 25 MG tablet Take 1 tablet (25 mg) in the morning and 5 tablets (125 mg) at bedtime 180 tablet 0  . diltiazem (DILACOR XR) 120 MG 24 hr capsule Take 120 mg by mouth daily.    Marland Kitchen eletriptan (RELPAX) 40 MG tablet TAKE 1 TAB AT ONSET OF HEADACHES. MAY REPEAT IN 2 HOURS.MAX 2/24 HRS. FOR 30 DAYS  6  . folic acid (FOLVITE) 1 MG tablet Take 1 tablet (1 mg total) by mouth daily. 30 tablet 0  . hydrochlorothiazide (HYDRODIURIL) 12.5 MG tablet TAKE ONE TABLET (12.5 MG DOSE) BY MOUTH DAILY. TAKE ON TABLET BY MOUTH ONCE A DAY IN THE MORNING.  0  . HYDROcodone-acetaminophen (NORCO/VICODIN) 5-325 MG tablet Take 1 tablet by mouth as needed for moderate pain.    Marland Kitchen lamoTRIgine (LAMICTAL) 25 MG tablet Take 2 tablets (50 mg total) by mouth 2 (two) times daily. 360 tablet 0  . levothyroxine (SYNTHROID, LEVOTHROID) 100 MCG tablet TAKE ONE TABLET (100 MCG DOSE) BY MOUTH DAILY.  5  . mirtazapine (REMERON) 30 MG tablet Take 1 tablet (30 mg total) by mouth at bedtime. 30 tablet 1  . POTASSIUM PO Take by mouth.    . prazosin (MINIPRESS) 2 MG capsule Take 1 capsule (2 mg total) by mouth at bedtime. 30 capsule 1  . TROKENDI XR 100 MG CP24 Take 1 tablet by mouth daily.  3  . TROKENDI XR 200 MG CP24 Take 1 tablet by mouth daily.  3  . Vitamin D, Ergocalciferol, (DRISDOL) 50000 units CAPS capsule Take 50,000 Units by mouth every 7 (seven) days. saturday     No current facility-administered medications for this visit.      Musculoskeletal: Strength & Muscle Tone: within normal limits Gait & Station: Difficulty walking due to boot on her right foot. Patient leans: Right  Psychiatric Specialty Exam: Review of Systems  Constitutional: Negative for weight loss.  Psychiatric/Behavioral: The patient is nervous/anxious and has  insomnia.     Blood pressure 119/80, height '5\' 11"'  (1.803 m), weight 251 lb (113.9 kg).Body mass index is 35.01 kg/m.  General Appearance: Disheveled  Eye Contact:  Fair  Speech:  Normal Rate  Volume:  Normal  Mood:  Anxious and Dysphoric  Affect:  Congruent and Constricted  Thought Process:  Descriptions of Associations: Tangential  Orientation:  Full (Time, Place, and Person)  Thought Content: Rumination and Having flashbacks and nightmares.   Suicidal Thoughts:  No  Homicidal Thoughts:  No  Memory:  Immediate;   Fair Recent;   Fair Remote;   Fair  Judgement:  Fair  Insight:  Present  Psychomotor Activity:  Decreased  Concentration:  Concentration: Fair and Attention Span: Fair  Recall:  [employer] of Knowledge: Good  Language: Good  Akathisia:  No  Handed:  Right  AIMS (if indicated): not done  Assets:  Communication Skills Desire for Improvement Resilience Social Support  ADL's:  Intact  Cognition: WNL  Sleep:  Poor   Screenings: AIMS     Admission (Discharged) from 02/27/2018 in [facility] Total Score  0    AUDIT     Admission (Discharged) from 02/27/2018 in [place] Admission (Discharged) from 06/07/2015 in [place] ED to Hosp-Admission (Discharged) from 04/06/2013 in [facility]  Alcohol Use Disorder Identification Test Final Score (AUDIT)  9  0  0       Assessment and Plan:  Schizophrenia chronic paranoid type.  Posttraumatic stress disorder.  Patient is now on clozapine.  She is taking 25 mg in the morning and 125 mg at bedtime.  She is tolerating clozapine without any side effects.  She has no drooling, palpitation.  Her lithium, Paxil and loxapine is discontinued in recent hospitalization.  She is taking Minipress 2 mg at bedtime I recommended to try Klonopin 0.5 mg 3 times a day to help anxiety.  The dose was reduced recently but she noticed  increased anxiety coming back.  I would also continue Lamictal 50 mg twice a day.  She has no rash, itching, tremors or shakes.  I recommended to start mental health Association in [place] since patient cannot come [facility] for group therapy.  I also reviewed blood work results.  Her absolute neutrophil count, WBC count and clozapine level is within therapeutic level.  Encourage therapy with Janett Billow.  Recommended to call us back if she is any question or any concern.  Discussed safety concerns at any time having active suicidal thoughts or homicidal thought and she need to call 911 or go to local emergency room.  I will see her again in 4 to 6 weeks. Time spent 25 minutes.  More than 50% of the time spent in psychoeducation, counseling and coordination of care.  Discuss safety plan that anytime having active suicidal thoughts or homicidal thoughts then patient need to call 911 or go to the local emergency room.     Kathlee Nations, MD 04/21/2018, 3:12 PM

## 2018-04-23 LAB — CBC WITH DIFFERENTIAL/PLATELET
ABSOLUTE MONOCYTES: 470 {cells}/uL (ref 200–950)
Basophils Absolute: 52 cells/uL (ref 0–200)
Basophils Relative: 0.6 %
EOS ABS: 139 {cells}/uL (ref 15–500)
EOS PCT: 1.6 %
HEMATOCRIT: 40.8 % (ref 35.0–45.0)
HEMOGLOBIN: 13.8 g/dL (ref 11.7–15.5)
LYMPHS ABS: 1523 {cells}/uL (ref 850–3900)
MCH: 31.8 pg (ref 27.0–33.0)
MCHC: 33.8 g/dL (ref 32.0–36.0)
MCV: 94 fL (ref 80.0–100.0)
MPV: 9.9 fL (ref 7.5–12.5)
Monocytes Relative: 5.4 %
NEUTROS ABS: 6516 {cells}/uL (ref 1500–7800)
NEUTROS PCT: 74.9 %
Platelets: 369 10*3/uL (ref 140–400)
RBC: 4.34 10*6/uL (ref 3.80–5.10)
RDW: 12.7 % (ref 11.0–15.0)
Total Lymphocyte: 17.5 %
WBC: 8.7 10*3/uL (ref 3.8–10.8)

## 2018-04-23 LAB — CLOZAPINE (CLOZARIL)
Clozapine Lvl: 278 mcg/L
NorClozapine: 138 mcg/L (ref 25–400)

## 2018-05-01 ENCOUNTER — Other Ambulatory Visit (HOSPITAL_COMMUNITY): Payer: Self-pay

## 2018-05-01 DIAGNOSIS — F200 Paranoid schizophrenia: Secondary | ICD-10-CM

## 2018-05-01 DIAGNOSIS — F4310 Post-traumatic stress disorder, unspecified: Secondary | ICD-10-CM

## 2018-05-01 MED ORDER — MIRTAZAPINE 30 MG PO TABS: 30.0000 mg | ORAL_TABLET | Freq: Every day | ORAL | 1 refills | Status: DC

## 2018-05-04 ENCOUNTER — Other Ambulatory Visit (HOSPITAL_COMMUNITY): Payer: Self-pay | Admitting: Psychiatry

## 2018-05-07 LAB — CBC WITH DIFFERENTIAL/PLATELET
Absolute Monocytes: 359 cells/uL (ref 200–950)
Basophils Absolute: 48 cells/uL (ref 0–200)
Basophils Relative: 0.7 %
Eosinophils Absolute: 117 cells/uL (ref 15–500)
Eosinophils Relative: 1.7 %
HEMATOCRIT: 41 % (ref 35.0–45.0)
HEMOGLOBIN: 14 g/dL (ref 11.7–15.5)
LYMPHS ABS: 2063 {cells}/uL (ref 850–3900)
MCH: 31.9 pg (ref 27.0–33.0)
MCHC: 34.1 g/dL (ref 32.0–36.0)
MCV: 93.4 fL (ref 80.0–100.0)
MPV: 10.1 fL (ref 7.5–12.5)
Monocytes Relative: 5.2 %
NEUTROS ABS: 4313 {cells}/uL (ref 1500–7800)
Neutrophils Relative %: 62.5 %
Platelets: 321 10*3/uL (ref 140–400)
RBC: 4.39 10*6/uL (ref 3.80–5.10)
RDW: 12.8 % (ref 11.0–15.0)
Total Lymphocyte: 29.9 %
WBC: 6.9 10*3/uL (ref 3.8–10.8)

## 2018-05-07 LAB — CLOZAPINE (CLOZARIL)
Clozapine Lvl: 252 mcg/L
NorClozapine: 141 mcg/L (ref 25–400)

## 2018-05-14 ENCOUNTER — Ambulatory Visit (HOSPITAL_COMMUNITY): Payer: BLUE CROSS/BLUE SHIELD | Admitting: Licensed Clinical Social Worker

## 2018-05-15 ENCOUNTER — Other Ambulatory Visit (HOSPITAL_COMMUNITY): Payer: Self-pay | Admitting: Psychiatry

## 2018-05-16 ENCOUNTER — Other Ambulatory Visit (HOSPITAL_COMMUNITY): Payer: Self-pay | Admitting: Psychiatry

## 2018-05-16 DIAGNOSIS — F4310 Post-traumatic stress disorder, unspecified: Secondary | ICD-10-CM

## 2018-05-20 LAB — CBC WITH DIFFERENTIAL/PLATELET
ABSOLUTE MONOCYTES: 333 {cells}/uL (ref 200–950)
BASOS PCT: 0.6 %
Basophils Absolute: 41 cells/uL (ref 0–200)
EOS ABS: 122 {cells}/uL (ref 15–500)
Eosinophils Relative: 1.8 %
HEMATOCRIT: 40 % (ref 35.0–45.0)
HEMOGLOBIN: 13.5 g/dL (ref 11.7–15.5)
Lymphs Abs: 1550 cells/uL (ref 850–3900)
MCH: 31.3 pg (ref 27.0–33.0)
MCHC: 33.8 g/dL (ref 32.0–36.0)
MCV: 92.8 fL (ref 80.0–100.0)
MPV: 10.5 fL (ref 7.5–12.5)
Monocytes Relative: 4.9 %
NEUTROS ABS: 4753 {cells}/uL (ref 1500–7800)
Neutrophils Relative %: 69.9 %
Platelets: 250 10*3/uL (ref 140–400)
RBC: 4.31 10*6/uL (ref 3.80–5.10)
RDW: 12.8 % (ref 11.0–15.0)
Total Lymphocyte: 22.8 %
WBC: 6.8 10*3/uL (ref 3.8–10.8)

## 2018-05-20 LAB — CLOZAPINE (CLOZARIL)
Clozapine Lvl: 337 mcg/L
NorClozapine: 135 mcg/L (ref 25–400)

## 2018-05-23 ENCOUNTER — Other Ambulatory Visit (HOSPITAL_COMMUNITY): Payer: Self-pay | Admitting: Psychiatry

## 2018-05-23 DIAGNOSIS — F4310 Post-traumatic stress disorder, unspecified: Secondary | ICD-10-CM

## 2018-05-23 DIAGNOSIS — F200 Paranoid schizophrenia: Secondary | ICD-10-CM

## 2018-05-28 ENCOUNTER — Ambulatory Visit (INDEPENDENT_AMBULATORY_CARE_PROVIDER_SITE_OTHER): Payer: BLUE CROSS/BLUE SHIELD | Admitting: Licensed Clinical Social Worker

## 2018-05-28 ENCOUNTER — Telehealth (HOSPITAL_COMMUNITY): Payer: Self-pay

## 2018-05-28 ENCOUNTER — Encounter (HOSPITAL_COMMUNITY): Payer: Self-pay | Admitting: Licensed Clinical Social Worker

## 2018-05-28 DIAGNOSIS — F4310 Post-traumatic stress disorder, unspecified: Secondary | ICD-10-CM | POA: Diagnosis not present

## 2018-05-28 DIAGNOSIS — F200 Paranoid schizophrenia: Secondary | ICD-10-CM

## 2018-05-28 NOTE — Progress Notes (Signed)
   THERAPIST PROGRESS NOTE  Session Time: 8:05am-8:50am  Participation Level: Active  Behavioral Response: DisheveledConfusedAnxious and Depressed  Type of Therapy: Individual Therapy  Treatment Goals addressed: Improve Psychiatric Symptoms, Emotional Regulation Skills, Calming Skills, Healthy Coping Skills, discuss and process traumatic event, reduced irrational worries and fears  Interventions: Motivational Interviewing, Cognitive Behavior Therapy and Grounding/Mindfulness Skills, psychoeducation  Summary: Danielle [name] is a 48 y.o. female who presents with Schizophrenia and PTSD.   Suicidal/Homicidal: No - without intent/plan  Therapist Response:  Danielle [name] met with clinician for an individual session. Danielle [name]'s husband joined the session to support her. Danielle [name] discussed her psychiatric symptoms, her current life events. Danielle [name] shared that she has been actively hearing voices constantly for the past 2 weeks. She reports this was the reason she did not come to her last appointment. Clinician explored the content of the messages and noted that they were very aggressive, mean, and disturbing: "you don't deserve to be here, you are stupid, you are dumb, you are doomed". Danielle [name] denied suicidality at this time. However, she reported she has been increasingly depressed and has been anxious. During the session, clinician noted that Danielle [name] was "zoning out" and appeared to be listening to something else. Clinician played music in session to provide an alternative auditory experience. This appeared to be helpful, as Danielle [name] responded better to conversation after the music was on.  Husband reports medication has been consistent, but she missed one blood draw, as they were not able to get blood last week and they are only allowed to try 3 times.  Clinician spoke with nurse and will communicate with Dr. Adele Schilder about medications. Clinician urged Danielle [name] and husband to go to the hospital if the voices become more  distressing and any thoughts of suicide present.   Plan: Return again in 2 weeks.  Diagnosis:     Axis I: Schizophrenia, paranoid type and PTSD   Danielle Curling, LCSW 05/28/2018

## 2018-05-28 NOTE — Telephone Encounter (Signed)
Patient came in to see Danielle CavesJessie today, nelson came to me and said patient is hearing voices, the voices are mean and disturbing. Patient having increased anxiety and depression, please review and advise.

## 2018-05-29 ENCOUNTER — Other Ambulatory Visit (HOSPITAL_COMMUNITY): Payer: Self-pay

## 2018-05-29 ENCOUNTER — Other Ambulatory Visit (HOSPITAL_COMMUNITY): Payer: Self-pay | Admitting: Psychiatry

## 2018-05-29 DIAGNOSIS — F4310 Post-traumatic stress disorder, unspecified: Secondary | ICD-10-CM

## 2018-05-29 DIAGNOSIS — F200 Paranoid schizophrenia: Secondary | ICD-10-CM

## 2018-06-01 LAB — CBC WITH DIFFERENTIAL/PLATELET
ABSOLUTE MONOCYTES: 493 {cells}/uL (ref 200–950)
BASOS PCT: 0.8 %
Basophils Absolute: 70 cells/uL (ref 0–200)
EOS ABS: 167 {cells}/uL (ref 15–500)
Eosinophils Relative: 1.9 %
HEMATOCRIT: 40.2 % (ref 35.0–45.0)
HEMOGLOBIN: 13.7 g/dL (ref 11.7–15.5)
LYMPHS ABS: 1892 {cells}/uL (ref 850–3900)
MCH: 31.7 pg (ref 27.0–33.0)
MCHC: 34.1 g/dL (ref 32.0–36.0)
MCV: 93.1 fL (ref 80.0–100.0)
MPV: 11 fL (ref 7.5–12.5)
Monocytes Relative: 5.6 %
NEUTROS ABS: 6178 {cells}/uL (ref 1500–7800)
Neutrophils Relative %: 70.2 %
Platelets: 286 10*3/uL (ref 140–400)
RBC: 4.32 10*6/uL (ref 3.80–5.10)
RDW: 12.6 % (ref 11.0–15.0)
Total Lymphocyte: 21.5 %
WBC: 8.8 10*3/uL (ref 3.8–10.8)

## 2018-06-01 LAB — CLOZAPINE (CLOZARIL)
CLOZAPINE LVL: 245 ug/L
NorClozapine: 118 mcg/L (ref 25–400)

## 2018-06-02 NOTE — Telephone Encounter (Signed)
Please call patient if she still have hallucination and interfere in her sleep.  Her clozapine level is low and she can try extra 25 mg at bedtime.  She need to be very careful because increase clozapine can drop her blood pressure and she may have a fall and dizziness.

## 2018-06-03 ENCOUNTER — Other Ambulatory Visit (HOSPITAL_COMMUNITY): Payer: Self-pay

## 2018-06-03 DIAGNOSIS — F4310 Post-traumatic stress disorder, unspecified: Secondary | ICD-10-CM

## 2018-06-03 DIAGNOSIS — F200 Paranoid schizophrenia: Secondary | ICD-10-CM

## 2018-06-03 MED ORDER — CLOZAPINE 25 MG PO TABS: ORAL_TABLET | ORAL | 0 refills | Status: DC

## 2018-06-03 NOTE — Telephone Encounter (Signed)
I called patients husband yesterday, he told me he thought the voices were getting better, however he called this morning and stated patient is still pretty bad and not sleeping. I advised him to increase the Clozaril to 6 tabs at bedtime, he agreed and I called a new prescription into the pharmacy.

## 2018-06-05 ENCOUNTER — Other Ambulatory Visit (HOSPITAL_COMMUNITY): Payer: Self-pay | Admitting: Psychiatry

## 2018-06-05 ENCOUNTER — Other Ambulatory Visit (HOSPITAL_COMMUNITY): Payer: Self-pay

## 2018-06-05 DIAGNOSIS — F4310 Post-traumatic stress disorder, unspecified: Secondary | ICD-10-CM

## 2018-06-05 MED ORDER — PRAZOSIN HCL 2 MG PO CAPS: 2.0000 mg | ORAL_CAPSULE | Freq: Every day | ORAL | 1 refills | Status: DC

## 2018-06-09 LAB — CBC WITH DIFFERENTIAL/PLATELET
ABSOLUTE MONOCYTES: 501 {cells}/uL (ref 200–950)
Basophils Absolute: 64 cells/uL (ref 0–200)
Basophils Relative: 0.7 %
Eosinophils Absolute: 137 cells/uL (ref 15–500)
Eosinophils Relative: 1.5 %
HCT: 40.8 % (ref 35.0–45.0)
Hemoglobin: 13.8 g/dL (ref 11.7–15.5)
LYMPHS ABS: 1929 {cells}/uL (ref 850–3900)
MCH: 31.4 pg (ref 27.0–33.0)
MCHC: 33.8 g/dL (ref 32.0–36.0)
MCV: 92.7 fL (ref 80.0–100.0)
MPV: 10.6 fL (ref 7.5–12.5)
Monocytes Relative: 5.5 %
NEUTROS PCT: 71.1 %
Neutro Abs: 6470 cells/uL (ref 1500–7800)
PLATELETS: 314 10*3/uL (ref 140–400)
RBC: 4.4 10*6/uL (ref 3.80–5.10)
RDW: 12.8 % (ref 11.0–15.0)
TOTAL LYMPHOCYTE: 21.2 %
WBC: 9.1 10*3/uL (ref 3.8–10.8)

## 2018-06-09 LAB — CLOZAPINE (CLOZARIL)
CLOZAPINE LVL: 327 ug/L
NorClozapine: 165 mcg/L (ref 25–400)

## 2018-06-11 ENCOUNTER — Encounter (HOSPITAL_COMMUNITY): Payer: Self-pay | Admitting: Psychiatry

## 2018-06-11 ENCOUNTER — Ambulatory Visit (INDEPENDENT_AMBULATORY_CARE_PROVIDER_SITE_OTHER): Payer: BLUE CROSS/BLUE SHIELD | Admitting: Psychiatry

## 2018-06-11 DIAGNOSIS — F4310 Post-traumatic stress disorder, unspecified: Secondary | ICD-10-CM | POA: Diagnosis not present

## 2018-06-11 DIAGNOSIS — F200 Paranoid schizophrenia: Secondary | ICD-10-CM | POA: Diagnosis not present

## 2018-06-11 MED ORDER — MIRTAZAPINE 30 MG PO TABS: 30.0000 mg | ORAL_TABLET | Freq: Every day | ORAL | 1 refills | Status: DC

## 2018-06-11 MED ORDER — CLOZAPINE 50 MG PO TABS: ORAL_TABLET | ORAL | 1 refills | Status: DC

## 2018-06-11 MED ORDER — PRAZOSIN HCL 2 MG PO CAPS: 2.0000 mg | ORAL_CAPSULE | Freq: Every day | ORAL | 1 refills | Status: DC
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
## 2018-06-18 ENCOUNTER — Encounter (HOSPITAL_COMMUNITY): Payer: Self-pay | Admitting: Licensed Clinical Social Worker

## 2018-06-18 ENCOUNTER — Ambulatory Visit (INDEPENDENT_AMBULATORY_CARE_PROVIDER_SITE_OTHER): Payer: BLUE CROSS/BLUE SHIELD | Admitting: Licensed Clinical Social Worker

## 2018-06-18 DIAGNOSIS — F4310 Post-traumatic stress disorder, unspecified: Secondary | ICD-10-CM

## 2018-06-18 DIAGNOSIS — F200 Paranoid schizophrenia: Secondary | ICD-10-CM

## 2018-06-18 NOTE — Progress Notes (Signed)
   THERAPIST PROGRESS NOTE  Session Time: 8:00am-8:50am  Participation Level: Active  Behavioral Response: CasualAlertDepressed  Type of Therapy: Individual Therapy  Treatment Goals addressed: Improve Psychiatric Symptoms, Emotional Regulation Skills, Calming Skills, Healthy Coping Skills, discuss and process traumatic event, reduced irrational worries and fears  Interventions: Motivational Interviewing, Cognitive Behavior Therapy and Grounding/Mindfulness Skills, psychoeducation  Summary: Danielle [name] is a 48 y.o. female who presents with Schizophrenia and PTSD.   Suicidal/Homicidal: No - without intent/plan  Therapist Response:  Danielle [name] met with clinician for an individual session. Danielle [name]'s husband joined the session to support her. Danielle [name] discussed her psychiatric symptoms, her current life events and her homework. Danielle [name] shared that she continues to hear voices that are suicidal and sometimes she agrees with the things she hears. Clinician explored options for coping, such as music, watching tv, or talking to someone. She reports she relies heavily on her husband to nurture her and protect her. However, due to his work schedule, he is not home at night. Danielle [name] discussed her feelings about how her husband deals with her. Danielle [name] was able to say what she needs from him. Danielle [name] reports that he is a quiet person, but he loves her and feels scared when she becomes suicidal.   Clinician discussed family issues and processed concerns about their daughter. Clinician also identified that there is little control she has over her children. Clinician processed options for her daughter to go into a program, such as a halfway house or some other type of program where she would receive help with work and housing.    Plan: Return again in 2 weeks.  Diagnosis:     Axis I: Schizophrenia, paranoid type and PTSD  [name], LCSW 06/18/2018

## 2018-06-19 ENCOUNTER — Other Ambulatory Visit (HOSPITAL_COMMUNITY): Payer: Self-pay | Admitting: Psychiatry

## 2018-06-21 ENCOUNTER — Other Ambulatory Visit (HOSPITAL_COMMUNITY): Payer: Self-pay | Admitting: Psychiatry

## 2018-06-21 DIAGNOSIS — F4310 Post-traumatic stress disorder, unspecified: Secondary | ICD-10-CM

## 2018-06-22 ENCOUNTER — Other Ambulatory Visit (HOSPITAL_COMMUNITY): Payer: Self-pay

## 2018-06-22 DIAGNOSIS — F4310 Post-traumatic stress disorder, unspecified: Secondary | ICD-10-CM

## 2018-06-22 MED ORDER — CLONAZEPAM 0.5 MG PO TABS: 0.5000 mg | ORAL_TABLET | Freq: Three times a day (TID) | ORAL | 1 refills | Status: DC | PRN

## 2018-06-23 LAB — CBC WITH DIFFERENTIAL/PLATELET
Absolute Monocytes: 677 cells/uL (ref 200–950)
BASOS PCT: 0.6 %
Basophils Absolute: 74 cells/uL (ref 0–200)
Eosinophils Absolute: 111 cells/uL (ref 15–500)
Eosinophils Relative: 0.9 %
HCT: 43.5 % (ref 35.0–45.0)
Hemoglobin: 14.7 g/dL (ref 11.7–15.5)
Lymphs Abs: 2251 cells/uL (ref 850–3900)
MCH: 31.1 pg (ref 27.0–33.0)
MCHC: 33.8 g/dL (ref 32.0–36.0)
MCV: 92 fL (ref 80.0–100.0)
MPV: 10.6 fL (ref 7.5–12.5)
Monocytes Relative: 5.5 %
Neutro Abs: 9188 cells/uL — ABNORMAL HIGH (ref 1500–7800)
Neutrophils Relative %: 74.7 %
PLATELETS: 326 10*3/uL (ref 140–400)
RBC: 4.73 10*6/uL (ref 3.80–5.10)
RDW: 12.8 % (ref 11.0–15.0)
TOTAL LYMPHOCYTE: 18.3 %
WBC: 12.3 10*3/uL — ABNORMAL HIGH (ref 3.8–10.8)

## 2018-06-23 LAB — CLOZAPINE (CLOZARIL)
Clozapine Lvl: 362 mcg/L
NorClozapine: 188 mcg/L (ref 25–400)

## 2018-06-25 ENCOUNTER — Other Ambulatory Visit (HOSPITAL_COMMUNITY): Payer: Self-pay | Admitting: Psychiatry

## 2018-06-27 LAB — CLOZAPINE (CLOZARIL)
Clozapine Lvl: 297 mcg/L
NorClozapine: 203 mcg/L (ref 25–400)

## 2018-06-27 LAB — CBC WITH DIFFERENTIAL/PLATELET
Absolute Monocytes: 437 cells/uL (ref 200–950)
BASOS ABS: 52 {cells}/uL (ref 0–200)
Basophils Relative: 0.5 %
EOS ABS: 135 {cells}/uL (ref 15–500)
Eosinophils Relative: 1.3 %
HCT: 43.6 % (ref 35.0–45.0)
Hemoglobin: 14.6 g/dL (ref 11.7–15.5)
Lymphs Abs: 2434 cells/uL (ref 850–3900)
MCH: 30.7 pg (ref 27.0–33.0)
MCHC: 33.5 g/dL (ref 32.0–36.0)
MCV: 91.6 fL (ref 80.0–100.0)
MPV: 10.9 fL (ref 7.5–12.5)
Monocytes Relative: 4.2 %
Neutro Abs: 7342 cells/uL (ref 1500–7800)
Neutrophils Relative %: 70.6 %
Platelets: 308 10*3/uL (ref 140–400)
RBC: 4.76 10*6/uL (ref 3.80–5.10)
RDW: 12.6 % (ref 11.0–15.0)
Total Lymphocyte: 23.4 %
WBC: 10.4 10*3/uL (ref 3.8–10.8)

## 2018-07-02 ENCOUNTER — Encounter (HOSPITAL_COMMUNITY): Payer: Self-pay | Admitting: Licensed Clinical Social Worker

## 2018-07-02 ENCOUNTER — Ambulatory Visit (INDEPENDENT_AMBULATORY_CARE_PROVIDER_SITE_OTHER): Payer: BLUE CROSS/BLUE SHIELD | Admitting: Licensed Clinical Social Worker

## 2018-07-02 DIAGNOSIS — F4310 Post-traumatic stress disorder, unspecified: Secondary | ICD-10-CM | POA: Diagnosis not present

## 2018-07-02 DIAGNOSIS — F200 Paranoid schizophrenia: Secondary | ICD-10-CM

## 2018-07-02 NOTE — Progress Notes (Signed)
   THERAPIST PROGRESS NOTE  Session Time: 8:00am-8:55am  Participation Level: Active  Behavioral Response: DisheveledAlertDepressed  Type of Therapy: Individual Therapy  Treatment Goals addressed: Improve Psychiatric Symptoms, Emotional Regulation Skills, Calming Skills, Healthy Coping Skills, discuss and process traumatic event, reduced irrational worries and fears  Interventions: Motivational Interviewing, Cognitive Behavior Therapy and Grounding/Mindfulness Skills, psychoeducation  Summary: Danielle [name] is a 48 y.o. female who presents with Schizophrenia and PTSD.   Suicidal/Homicidal: No - without intent/plan  Therapist Response:  [name] met with clinician for an individual session. [name]'s husband joined the session to support her. [name] discussed her psychiatric symptoms, her current life events and her homework. [name] shared that she was feeling sad and missing her daddy. She provided some insight to the trigger, which was a song on the radio. Clinician assisted [name] in processing thoughts and feelings using MI reflections. Clinician explored other events over the past 2 weeks. [name] reported her voices have changed to "two voices talking about me". [name] reports she hears two voices saying "she's so stupid, she should just end it, she isn't worth anything". Clinician explored any suicidal thoughts or urges, but [name] denied this at the moment.  [name] and husband engaged well in discussion about the children, thinking about the "good old days" when they were a "happy family". [name] reported that it had been a very long time since they were a happy family and had a big Christmas. Clinician processed those emotions and explored options for reigniting some of the previous holiday feelings or spirit. Clinician explored interactions with adult children and noted significant challenges. Clinician encouraged [name] and [name] to plan dates on weekends to ensure the bond in their relationship.    Plan: Return again in 2 weeks.  Diagnosis:     Axis I: Schizophrenia, paranoid type and PTSD [name], LCSW 07/02/2018

## 2018-07-03 ENCOUNTER — Other Ambulatory Visit (HOSPITAL_COMMUNITY): Payer: Self-pay | Admitting: Psychiatry

## 2018-07-04 ENCOUNTER — Other Ambulatory Visit (HOSPITAL_COMMUNITY): Payer: Self-pay | Admitting: Psychiatry

## 2018-07-04 DIAGNOSIS — F4310 Post-traumatic stress disorder, unspecified: Secondary | ICD-10-CM

## 2018-07-07 LAB — CBC WITH DIFFERENTIAL/PLATELET
Absolute Monocytes: 436 cells/uL (ref 200–950)
Basophils Absolute: 62 cells/uL (ref 0–200)
Basophils Relative: 0.7 %
Eosinophils Absolute: 160 cells/uL (ref 15–500)
Eosinophils Relative: 1.8 %
HEMATOCRIT: 43.8 % (ref 35.0–45.0)
Hemoglobin: 15 g/dL (ref 11.7–15.5)
Lymphs Abs: 2029 cells/uL (ref 850–3900)
MCH: 31.2 pg (ref 27.0–33.0)
MCHC: 34.2 g/dL (ref 32.0–36.0)
MCV: 91.1 fL (ref 80.0–100.0)
MPV: 10.5 fL (ref 7.5–12.5)
Monocytes Relative: 4.9 %
NEUTROS PCT: 69.8 %
Neutro Abs: 6212 cells/uL (ref 1500–7800)
Platelets: 330 10*3/uL (ref 140–400)
RBC: 4.81 10*6/uL (ref 3.80–5.10)
RDW: 12.7 % (ref 11.0–15.0)
Total Lymphocyte: 22.8 %
WBC: 8.9 10*3/uL (ref 3.8–10.8)

## 2018-07-07 LAB — CLOZAPINE (CLOZARIL)
CLOZAPINE LVL: 384 ug/L
NorClozapine: 176 mcg/L (ref 25–400)

## 2018-07-09 ENCOUNTER — Ambulatory Visit (HOSPITAL_COMMUNITY): Payer: BLUE CROSS/BLUE SHIELD | Admitting: Licensed Clinical Social Worker

## 2018-07-10 ENCOUNTER — Other Ambulatory Visit (HOSPITAL_COMMUNITY): Payer: Self-pay | Admitting: Psychiatry

## 2018-07-15 LAB — CBC WITH DIFFERENTIAL/PLATELET
Absolute Monocytes: 331 cells/uL (ref 200–950)
Basophils Absolute: 62 cells/uL (ref 0–200)
Basophils Relative: 0.8 %
Eosinophils Absolute: 169 cells/uL (ref 15–500)
Eosinophils Relative: 2.2 %
HCT: 42.4 % (ref 35.0–45.0)
Hemoglobin: 14.2 g/dL (ref 11.7–15.5)
Lymphs Abs: 1910 cells/uL (ref 850–3900)
MCH: 30.3 pg (ref 27.0–33.0)
MCHC: 33.5 g/dL (ref 32.0–36.0)
MCV: 90.6 fL (ref 80.0–100.0)
MPV: 10.5 fL (ref 7.5–12.5)
Monocytes Relative: 4.3 %
NEUTROS ABS: 5228 {cells}/uL (ref 1500–7800)
Neutrophils Relative %: 67.9 %
Platelets: 292 10*3/uL (ref 140–400)
RBC: 4.68 10*6/uL (ref 3.80–5.10)
RDW: 12.7 % (ref 11.0–15.0)
Total Lymphocyte: 24.8 %
WBC: 7.7 10*3/uL (ref 3.8–10.8)

## 2018-07-15 LAB — CLOZAPINE (CLOZARIL)
Clozapine Lvl: 182 mcg/L
NORCLOZAPINE: 97 ug/L (ref 25–400)

## 2018-07-17 ENCOUNTER — Other Ambulatory Visit (HOSPITAL_COMMUNITY): Payer: Self-pay | Admitting: Psychiatry

## 2018-07-21 LAB — CBC WITH DIFFERENTIAL/PLATELET
Absolute Monocytes: 390 cells/uL (ref 200–950)
Basophils Absolute: 47 cells/uL (ref 0–200)
Basophils Relative: 0.6 %
Eosinophils Absolute: 140 cells/uL (ref 15–500)
Eosinophils Relative: 1.8 %
HEMATOCRIT: 42.9 % (ref 35.0–45.0)
HEMOGLOBIN: 14.6 g/dL (ref 11.7–15.5)
Lymphs Abs: 2044 cells/uL (ref 850–3900)
MCH: 31.1 pg (ref 27.0–33.0)
MCHC: 34 g/dL (ref 32.0–36.0)
MCV: 91.3 fL (ref 80.0–100.0)
MPV: 10.5 fL (ref 7.5–12.5)
Monocytes Relative: 5 %
Neutro Abs: 5179 cells/uL (ref 1500–7800)
Neutrophils Relative %: 66.4 %
Platelets: 290 10*3/uL (ref 140–400)
RBC: 4.7 10*6/uL (ref 3.80–5.10)
RDW: 12.8 % (ref 11.0–15.0)
Total Lymphocyte: 26.2 %
WBC: 7.8 10*3/uL (ref 3.8–10.8)

## 2018-07-21 LAB — CLOZAPINE (CLOZARIL)
Clozapine Lvl: 342 mcg/L
NorClozapine: 159 mcg/L (ref 25–400)

## 2018-07-23 ENCOUNTER — Encounter (HOSPITAL_COMMUNITY): Payer: Self-pay | Admitting: Licensed Clinical Social Worker

## 2018-07-23 ENCOUNTER — Ambulatory Visit (INDEPENDENT_AMBULATORY_CARE_PROVIDER_SITE_OTHER): Payer: BLUE CROSS/BLUE SHIELD | Admitting: Licensed Clinical Social Worker

## 2018-07-23 ENCOUNTER — Other Ambulatory Visit: Payer: Self-pay

## 2018-07-23 DIAGNOSIS — F4310 Post-traumatic stress disorder, unspecified: Secondary | ICD-10-CM

## 2018-07-23 DIAGNOSIS — F200 Paranoid schizophrenia: Secondary | ICD-10-CM | POA: Diagnosis not present

## 2018-07-23 NOTE — Progress Notes (Signed)
THERAPIST PROGRESS NOTE  Session Time: 8:00am-8:45am  Participation Level: Active  Behavioral Response: DisheveledDrowsyEuthymic and Irritable  Type of Therapy: Individual Therapy  Treatment Goals addressed: Improve Psychiatric Symptoms, Emotional Regulation Skills, Calming Skills, Healthy Coping Skills, discuss and process traumatic event, reduced irrational worries and fears  Interventions: Motivational Interviewing, Cognitive Behavior Therapy and Grounding/Mindfulness Skills, psychoeducation  Summary: Danielle [name] is a 47 y.o. female who presents with Schizophrenia and PTSD.   Suicidal/Homicidal: No - without intent/plan  Therapist Response:  Mallory met with clinician for an individual session.Danielle [name] discussed her psychiatric symptoms, her current life events and her homework. Danielle [name] shared that her daughter was released from jail about 3 weeks ago and she has been adding stress to Danielle [name] and her husband on a daily basis. Danielle [name] reports daughter is not paying rent on a regular basis, she has brought her dog into the home, and she has not started job searching yet. Clinician processed frustrations and identified the need for expressing concrete expectations of daughter. Clinician explored mood sxs and noted some increase in panic attacks. However, Danielle [name] reports that sometimes daughter can be very supportive and soothing to Danielle [name] in these events. Danielle [name] reported concern about marital satisfaction and discussed the need for a marriage counselor in [place]. Clinician provided list via psychology today therapist finder.   Plan: Return again in 2 weeks.  Diagnosis:     Axis I: Schizophrenia, paranoid type and PTSD   R , LCSW 07/23/2018  

## 2018-07-24 ENCOUNTER — Other Ambulatory Visit (HOSPITAL_COMMUNITY): Payer: Self-pay | Admitting: Psychiatry

## 2018-07-28 LAB — CLOZAPINE (CLOZARIL)
Clozapine Lvl: 387 mcg/L
NorClozapine: 167 mcg/L (ref 25–400)

## 2018-07-28 LAB — CBC WITH DIFFERENTIAL/PLATELET
Absolute Monocytes: 398 cells/uL (ref 200–950)
Basophils Absolute: 68 cells/uL (ref 0–200)
Basophils Relative: 0.9 %
Eosinophils Absolute: 128 cells/uL (ref 15–500)
Eosinophils Relative: 1.7 %
HCT: 41.4 % (ref 35.0–45.0)
Hemoglobin: 14.1 g/dL (ref 11.7–15.5)
Lymphs Abs: 1800 cells/uL (ref 850–3900)
MCH: 31.2 pg (ref 27.0–33.0)
MCHC: 34.1 g/dL (ref 32.0–36.0)
MCV: 91.6 fL (ref 80.0–100.0)
MPV: 10.1 fL (ref 7.5–12.5)
Monocytes Relative: 5.3 %
Neutro Abs: 5108 cells/uL (ref 1500–7800)
Neutrophils Relative %: 68.1 %
Platelets: 290 10*3/uL (ref 140–400)
RBC: 4.52 10*6/uL (ref 3.80–5.10)
RDW: 12.9 % (ref 11.0–15.0)
Total Lymphocyte: 24 %
WBC: 7.5 10*3/uL (ref 3.8–10.8)

## 2018-07-30 ENCOUNTER — Ambulatory Visit (HOSPITAL_COMMUNITY): Payer: BLUE CROSS/BLUE SHIELD | Admitting: Psychiatry

## 2018-07-31 ENCOUNTER — Other Ambulatory Visit: Payer: Self-pay

## 2018-07-31 ENCOUNTER — Telehealth (INDEPENDENT_AMBULATORY_CARE_PROVIDER_SITE_OTHER): Payer: BLUE CROSS/BLUE SHIELD | Admitting: Psychiatry

## 2018-07-31 DIAGNOSIS — F4310 Post-traumatic stress disorder, unspecified: Secondary | ICD-10-CM | POA: Diagnosis not present

## 2018-07-31 DIAGNOSIS — F200 Paranoid schizophrenia: Secondary | ICD-10-CM | POA: Diagnosis not present

## 2018-07-31 MED ORDER — CLOZAPINE 50 MG PO TABS: ORAL_TABLET | ORAL | 2 refills | Status: DC

## 2018-07-31 MED ORDER — PRAZOSIN HCL 2 MG PO CAPS: 2.0000 mg | ORAL_CAPSULE | Freq: Every day | ORAL | 0 refills | Status: DC

## 2018-07-31 MED ORDER — CLONAZEPAM 0.5 MG PO TABS: 0.5000 mg | ORAL_TABLET | Freq: Three times a day (TID) | ORAL | 2 refills | Status: DC | PRN

## 2018-07-31 MED ORDER — MIRTAZAPINE 30 MG PO TABS: 30.0000 mg | ORAL_TABLET | Freq: Every day | ORAL | 2 refills | Status: DC
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
## 2018-08-06 ENCOUNTER — Other Ambulatory Visit: Payer: Self-pay

## 2018-08-06 ENCOUNTER — Ambulatory Visit (INDEPENDENT_AMBULATORY_CARE_PROVIDER_SITE_OTHER): Payer: BLUE CROSS/BLUE SHIELD | Admitting: Licensed Clinical Social Worker

## 2018-08-06 ENCOUNTER — Encounter (HOSPITAL_COMMUNITY): Payer: Self-pay | Admitting: Licensed Clinical Social Worker

## 2018-08-06 DIAGNOSIS — F200 Paranoid schizophrenia: Secondary | ICD-10-CM

## 2018-08-06 DIAGNOSIS — F4310 Post-traumatic stress disorder, unspecified: Secondary | ICD-10-CM | POA: Diagnosis not present

## 2018-08-06 NOTE — Progress Notes (Signed)
Virtual Visit via Video Note  I connected with [name] on 08/06/18 at  8:00 AM EDT by a video enabled telemedicine application and verified that I am speaking with the correct person using two identifiers.   I discussed the limitations of evaluation and management by telemedicine and the availability of in person appointments. The patient expressed understanding and agreed to proceed.  Type of Therapy: Individual Therapy  Treatment Goals addressed: Improve Psychiatric Symptoms, Emotional Regulation Skills, Calming Skills, Healthy Coping Skills, discuss and process traumatic event, reduced irrational worries and fears  Interventions: Motivational Interviewing, Cognitive Behavior Therapy and Grounding/Mindfulness Skills, psychoeducation  Summary: Danielle [name] is a 48 y.o. female who presents with Schizophrenia and PTSD.   Suicidal/Homicidal: No - without intent/plan  Therapist Response:  Kirstein met with clinician for an individual session. [name] discussed her psychiatric symptoms, her current life events and her homework. Danielle [name] shared that she has been doing okay, but having more panic attacks recently due to concerns about COVID-19, health of her husband who continues to work at his job, and concerns over her children. Seh reports her daughter left the home 2 weeks ago and has not been back. Clinician explored contact with daughter and noted that they spoke yesterday for a few minutes. Clinician explored coping skills and identification of triggers. Clinician challenged Danielle [name] to use coping skills prior to anxiety in order to prepare, also to use thought stopping, as repetitive thoughts can increase anxiety.   Session was cut short due to [place] passing out mid-session. Husband reported he will update on her status.   Plan: Return again in 2 weeks.  Diagnosis:     Axis I: Schizophrenia, paranoid type and PTSD   I discussed the assessment and treatment plan with the patient. The patient  was provided an opportunity to ask questions and all were answered. The patient agreed with the plan and demonstrated an understanding of the instructions.   The patient was advised to call back or seek an in-person evaluation if the symptoms worsen or if the condition fails to improve as anticipated.  I provided 30 minutes of non-face-to-face time during this encounter.   [name], LCSW

## 2018-08-07 ENCOUNTER — Other Ambulatory Visit (HOSPITAL_COMMUNITY): Payer: Self-pay | Admitting: Psychiatry

## 2018-08-10 LAB — CBC WITH DIFFERENTIAL/PLATELET
Absolute Monocytes: 390 cells/uL (ref 200–950)
Basophils Absolute: 67 cells/uL (ref 0–200)
Basophils Relative: 0.7 %
Eosinophils Absolute: 143 cells/uL (ref 15–500)
Eosinophils Relative: 1.5 %
HCT: 42.4 % (ref 35.0–45.0)
Hemoglobin: 14.7 g/dL (ref 11.7–15.5)
Lymphs Abs: 2366 cells/uL (ref 850–3900)
MCH: 31.3 pg (ref 27.0–33.0)
MCHC: 34.7 g/dL (ref 32.0–36.0)
MCV: 90.4 fL (ref 80.0–100.0)
MPV: 10.6 fL (ref 7.5–12.5)
Monocytes Relative: 4.1 %
Neutro Abs: 6536 cells/uL (ref 1500–7800)
Neutrophils Relative %: 68.8 %
Platelets: 307 10*3/uL (ref 140–400)
RBC: 4.69 10*6/uL (ref 3.80–5.10)
RDW: 12.7 % (ref 11.0–15.0)
Total Lymphocyte: 24.9 %
WBC: 9.5 10*3/uL (ref 3.8–10.8)

## 2018-08-10 LAB — CLOZAPINE (CLOZARIL)
Clozapine Lvl: 277 mcg/L
NorClozapine: 139 mcg/L (ref 25–400)

## 2018-08-20 ENCOUNTER — Encounter (HOSPITAL_COMMUNITY): Payer: Self-pay | Admitting: Licensed Clinical Social Worker

## 2018-08-20 ENCOUNTER — Ambulatory Visit (INDEPENDENT_AMBULATORY_CARE_PROVIDER_SITE_OTHER): Payer: BLUE CROSS/BLUE SHIELD | Admitting: Licensed Clinical Social Worker

## 2018-08-20 ENCOUNTER — Other Ambulatory Visit: Payer: Self-pay

## 2018-08-20 DIAGNOSIS — F4310 Post-traumatic stress disorder, unspecified: Secondary | ICD-10-CM | POA: Diagnosis not present

## 2018-08-20 DIAGNOSIS — F200 Paranoid schizophrenia: Secondary | ICD-10-CM | POA: Diagnosis not present

## 2018-08-20 NOTE — Progress Notes (Signed)
Virtual Visit via Video Note  I connected with Danielle [name] on 08/20/18 at  8:00 AM EDT by a video enabled telemedicine application and verified that I am speaking with the correct person using two identifiers.   I discussed the limitations of evaluation and management by telemedicine and the availability of in person appointments. The patient expressed understanding and agreed to proceed.  Type of Therapy: Individual Therapy   Treatment Goals addressed: Improve Psychiatric Symptoms, Emotional Regulation Skills, Calming Skills, Healthy Coping Skills, discuss and process traumatic event, reduced irrational worries and fears   Interventions: Motivational Interviewing, Cognitive Behavior Therapy and Grounding/Mindfulness Skills, psychoeducation   Summary: Danielle [name] is a 48 y.o. female who presents with Schizophrenia and PTSD.   Suicidal/Homicidal: No - without intent/plan   Therapist Response:  Anahita met with clinician for an individual session. Danielle [name]'s husband joined the session to support her. Danielle [name] discussed her psychiatric symptoms, her current life events and her homework. Danielle [name] shared that she has been doing better since our last session. She reports a couple of incidents with passing out, but she does not remember how many times. She also reports panic attacks and the feeling of wanting to drive and not come home. Clinician explored safety issues and any other reasons for not wanting to be home. Danielle [name] denied any concerns or any options of somewhere to go anyway. Clinician explored mood and psychiatric sxs. Danielle [name] reports that she feels pretty good for the most part. She processed interactions and updates on her daughter, who is staying with her. Clinician noted that it may be a hard time to find a job, due to everything being shut down. However, clinician also noted that this is a good time to stay close to home due to COVID-19 and wanting to maintain Danielle [name]'s health, as well as Danielle [name]'s  mother's health. Clinician discussed ways to maintain routine during this time and encouraged continuing the same bedtime, wake time, and exercise patterns. Clinician urged [name] to walk daily, even if it is for a short distance.   Plan: Return again in 2 weeks.   Diagnosis: Axis I: Schizophrenia, paranoid type and PTSD    I discussed the assessment and treatment plan with the patient. The patient was provided an opportunity to ask questions and all were answered. The patient agreed with the plan and demonstrated an understanding of the instructions.   The patient was advised to call back or seek an in-person evaluation if the symptoms worsen or if the condition fails to improve as anticipated.  I provided 45 minutes of non-face-to-face time during this encounter.   [name], LCSW

## 2018-08-24 ENCOUNTER — Other Ambulatory Visit (HOSPITAL_COMMUNITY): Payer: Self-pay | Admitting: Psychiatry

## 2018-08-26 LAB — CBC WITH DIFFERENTIAL/PLATELET
Absolute Monocytes: 513 {cells}/uL (ref 200–950)
Basophils Absolute: 48 {cells}/uL (ref 0–200)
Basophils Relative: 0.5 %
Eosinophils Absolute: 143 {cells}/uL (ref 15–500)
Eosinophils Relative: 1.5 %
HCT: 41.9 % (ref 35.0–45.0)
Hemoglobin: 14.5 g/dL (ref 11.7–15.5)
Lymphs Abs: 2318 {cells}/uL (ref 850–3900)
MCH: 31.7 pg (ref 27.0–33.0)
MCHC: 34.6 g/dL (ref 32.0–36.0)
MCV: 91.5 fL (ref 80.0–100.0)
MPV: 11.2 fL (ref 7.5–12.5)
Monocytes Relative: 5.4 %
Neutro Abs: 6479 {cells}/uL (ref 1500–7800)
Neutrophils Relative %: 68.2 %
Platelets: 280 10*3/uL (ref 140–400)
RBC: 4.58 Million/uL (ref 3.80–5.10)
RDW: 12.8 % (ref 11.0–15.0)
Total Lymphocyte: 24.4 %
WBC: 9.5 10*3/uL (ref 3.8–10.8)

## 2018-08-26 LAB — CLOZAPINE (CLOZARIL)
Clozapine Lvl: 374 ug/L
NorClozapine: 205 ug/L (ref 25–400)

## 2018-09-03 ENCOUNTER — Other Ambulatory Visit: Payer: Self-pay

## 2018-09-03 ENCOUNTER — Encounter (HOSPITAL_COMMUNITY): Payer: Self-pay | Admitting: Licensed Clinical Social Worker

## 2018-09-03 ENCOUNTER — Ambulatory Visit (INDEPENDENT_AMBULATORY_CARE_PROVIDER_SITE_OTHER): Payer: BLUE CROSS/BLUE SHIELD | Admitting: Licensed Clinical Social Worker

## 2018-09-03 DIAGNOSIS — F4310 Post-traumatic stress disorder, unspecified: Secondary | ICD-10-CM

## 2018-09-03 DIAGNOSIS — F200 Paranoid schizophrenia: Secondary | ICD-10-CM

## 2018-09-03 NOTE — Progress Notes (Signed)
Virtual Visit via Video Note  I connected with [place] on 09/03/18 at  8:00 AM EDT by a video enabled telemedicine application and verified that I am speaking with the correct person using two identifiers.  Location: Patient: home Provider: home   I discussed the limitations of evaluation and management by telemedicine and the availability of in person appointments. The patient expressed understanding and agreed to proceed.  Type of Therapy: Individual Therapy   Treatment Goals addressed: Improve Psychiatric Symptoms, Emotional Regulation Skills, Calming Skills, Healthy Coping Skills, discuss and process traumatic event, reduced irrational worries and fears   Interventions: Motivational Interviewing, Cognitive Behavior Therapy and Grounding/Mindfulness Skills, psychoeducation   Summary: ARRIN ISHLER is a 48 y.o. female who presents with Schizophrenia and PTSD.   Suicidal/Homicidal: No - without intent/plan   Therapist Response:  Ramona met with clinician for an individual session.  Delynda discussed her psychiatric symptoms, her current life events. Willer shared that she has been doing okay since last session. She continues to experience panic attacks, but has been a little better able to identify them early and use coping skills to move through them. Clinician explored which coping skills seem to be most helpful. Glorian reported calling her husband and being close to him, taking a break, going outside for fresh air. Clinician explored triggers and noted some specific triggers, but also some panic attacks occur when Aliece and husband are in bed relaxing. Clinician explored thought patterns and noted the possibility of overthinking or catastrophizing which may lead to panic.  Shawntay discussed feelings about daughter being home, her concerns about her choices, and worry that she may be doing inappropriate things. Alenna also shared that her niece will be moving in with them soon, which will be  a good things, as they get along well and the niece is responsible and positive.   Plan: Return again in 2 weeks.   Diagnosis: Axis I: Schizophrenia, paranoid type and PTSD   I discussed the assessment and treatment plan with the patient. The patient was provided an opportunity to ask questions and all were answered. The patient agreed with the plan and demonstrated an understanding of the instructions.   The patient was advised to call back or seek an in-person evaluation if the symptoms worsen or if the condition fails to improve as anticipated.  I provided 45 minutes of non-face-to-face time during this encounter.   [name], LCSW

## 2018-09-04 ENCOUNTER — Other Ambulatory Visit (HOSPITAL_COMMUNITY): Payer: Self-pay | Admitting: Psychiatry

## 2018-09-09 LAB — CBC WITH DIFFERENTIAL/PLATELET
Absolute Monocytes: 465 cells/uL (ref 200–950)
Basophils Absolute: 69 cells/uL (ref 0–200)
Basophils Relative: 0.7 %
Eosinophils Absolute: 149 cells/uL (ref 15–500)
Eosinophils Relative: 1.5 %
HCT: 41.9 % (ref 35.0–45.0)
Hemoglobin: 14.4 g/dL (ref 11.7–15.5)
Lymphs Abs: 2336 cells/uL (ref 850–3900)
MCH: 31.2 pg (ref 27.0–33.0)
MCHC: 34.4 g/dL (ref 32.0–36.0)
MCV: 90.7 fL (ref 80.0–100.0)
MPV: 11 fL (ref 7.5–12.5)
Monocytes Relative: 4.7 %
Neutro Abs: 6881 cells/uL (ref 1500–7800)
Neutrophils Relative %: 69.5 %
Platelets: 306 10*3/uL (ref 140–400)
RBC: 4.62 10*6/uL (ref 3.80–5.10)
RDW: 12.7 % (ref 11.0–15.0)
Total Lymphocyte: 23.6 %
WBC: 9.9 10*3/uL (ref 3.8–10.8)

## 2018-09-09 LAB — CLOZAPINE (CLOZARIL)
Clozapine Lvl: 261 mcg/L
NorClozapine: 136 mcg/L (ref 25–400)

## 2018-09-11 ENCOUNTER — Other Ambulatory Visit (HOSPITAL_COMMUNITY): Payer: Self-pay | Admitting: Psychiatry

## 2018-09-14 LAB — CBC WITH DIFFERENTIAL/PLATELET
Absolute Monocytes: 431 cells/uL (ref 200–950)
Basophils Absolute: 69 cells/uL (ref 0–200)
Basophils Relative: 0.7 %
Eosinophils Absolute: 167 cells/uL (ref 15–500)
Eosinophils Relative: 1.7 %
HCT: 43.4 % (ref 35.0–45.0)
Hemoglobin: 14.6 g/dL (ref 11.7–15.5)
Lymphs Abs: 3126 cells/uL (ref 850–3900)
MCH: 30.4 pg (ref 27.0–33.0)
MCHC: 33.6 g/dL (ref 32.0–36.0)
MCV: 90.4 fL (ref 80.0–100.0)
MPV: 10.6 fL (ref 7.5–12.5)
Monocytes Relative: 4.4 %
Neutro Abs: 6007 cells/uL (ref 1500–7800)
Neutrophils Relative %: 61.3 %
Platelets: 311 10*3/uL (ref 140–400)
RBC: 4.8 10*6/uL (ref 3.80–5.10)
RDW: 12.7 % (ref 11.0–15.0)
Total Lymphocyte: 31.9 %
WBC: 9.8 10*3/uL (ref 3.8–10.8)

## 2018-09-14 LAB — CLOZAPINE (CLOZARIL)
Clozapine Lvl: 258 mcg/L
NorClozapine: 152 mcg/L (ref 25–400)

## 2018-09-22 ENCOUNTER — Telehealth (HOSPITAL_COMMUNITY): Payer: Self-pay

## 2018-09-22 NOTE — Telephone Encounter (Signed)
Patients husband is calling, he states patient is hearing voices, seeing shadow people, having panic attacks and she is not sleeping really well. Please review and advise, thank you

## 2018-09-22 NOTE — Telephone Encounter (Signed)
She can try to take extra clozapine 50 mg to help her hallucination and sleep.  Currently she is taking clozapine 1 tablet in the morning 1 in the afternoon and 2 at bedtime.  She can try to take clozapine 50 mg 3 times during the day and 2 at bedtime.  However if she started to feel very dizzy then she need to call us back.  Patient has a history of dizziness and fall.

## 2018-09-23 ENCOUNTER — Other Ambulatory Visit: Payer: Self-pay

## 2018-09-23 ENCOUNTER — Encounter (HOSPITAL_COMMUNITY): Payer: Self-pay | Admitting: Licensed Clinical Social Worker

## 2018-09-23 ENCOUNTER — Ambulatory Visit (INDEPENDENT_AMBULATORY_CARE_PROVIDER_SITE_OTHER): Payer: BLUE CROSS/BLUE SHIELD | Admitting: Licensed Clinical Social Worker

## 2018-09-23 DIAGNOSIS — F4310 Post-traumatic stress disorder, unspecified: Secondary | ICD-10-CM

## 2018-09-23 DIAGNOSIS — F200 Paranoid schizophrenia: Secondary | ICD-10-CM

## 2018-09-23 NOTE — Progress Notes (Signed)
Virtual Visit via Video Note  I connected with [place] on 09/23/18 at  8:00 AM EDT by a video enabled telemedicine application and verified that I am speaking with the correct person using two identifiers.    I discussed the limitations of evaluation and management by telemedicine and the availability of in person appointments. The patient expressed understanding and agreed to proceed.  Type of Therapy: Individual Therapy   Treatment Goals addressed: Improve Psychiatric Symptoms, Emotional Regulation Skills, Calming Skills, Healthy Coping Skills, discuss and process traumatic event, reduced irrational worries and fears   Interventions: Motivational Interviewing, Cognitive Behavior Therapy and Grounding/Mindfulness Skills, psychoeducation   Summary: Danielle [name] is a 48 y.o. female who presents with Schizophrenia and PTSD.   Suicidal/Homicidal: No - without intent/plan   Therapist Response:  Vera met with clinician for an individual session. Chariah's husband joined the session to support her. Kriste discussed her psychiatric symptoms, her current life events. Shauntia shared that she has been experiencing increased panic attacks, trouble sleeping, feeling insecure, and seeing more shadow people and hearing voices. Clinician explored the triggers to these increased sxs and noted that there continues to be some insecurity with daughter in and out of her home and with husband working 6 days a week.  Veleda reports she has been trying to use coping skills and to engage with others, such as her mother in law and her niece. She reports her daughter was good to her on Mother's Day and she was able to spend some time with her husband that day. Clinician explored and reviewed coping skills such as mindfulness and CBT skills in order to improve her mood.  Stacyann reached out to nurse yesterday about medications. Clinician communicated with nurse and requested a call back for Florie to discuss medications.    Plan: Return again in 2 weeks.   Diagnosis: Axis I: Schizophrenia, paranoid type and PTSD   I discussed the assessment and treatment plan with the patient. The patient was provided an opportunity to ask questions and all were answered. The patient agreed with the plan and demonstrated an understanding of the instructions.   The patient was advised to call back or seek an in-person evaluation if the symptoms worsen or if the condition fails to improve as anticipated.  I provided 45 minutes of non-face-to-face time during this encounter.   [name], LCSW

## 2018-09-23 NOTE — Telephone Encounter (Signed)
I called patients husband and left him a voicemail letting him know to increase the Clozapine to three a day and to call me back at the end of the week to let me know how the patient is doing.

## 2018-09-25 ENCOUNTER — Other Ambulatory Visit (HOSPITAL_COMMUNITY): Payer: Self-pay | Admitting: Psychiatry

## 2018-09-29 LAB — CBC WITH DIFFERENTIAL/PLATELET
Absolute Monocytes: 387 cells/uL (ref 200–950)
Basophils Absolute: 60 cells/uL (ref 0–200)
Basophils Relative: 0.7 %
Eosinophils Absolute: 129 cells/uL (ref 15–500)
Eosinophils Relative: 1.5 %
HCT: 43.5 % (ref 35.0–45.0)
Hemoglobin: 14.9 g/dL (ref 11.7–15.5)
Lymphs Abs: 2073 cells/uL (ref 850–3900)
MCH: 31.2 pg (ref 27.0–33.0)
MCHC: 34.3 g/dL (ref 32.0–36.0)
MCV: 91 fL (ref 80.0–100.0)
MPV: 10.4 fL (ref 7.5–12.5)
Monocytes Relative: 4.5 %
Neutro Abs: 5951 cells/uL (ref 1500–7800)
Neutrophils Relative %: 69.2 %
Platelets: 303 10*3/uL (ref 140–400)
RBC: 4.78 10*6/uL (ref 3.80–5.10)
RDW: 12.7 % (ref 11.0–15.0)
Total Lymphocyte: 24.1 %
WBC: 8.6 10*3/uL (ref 3.8–10.8)

## 2018-09-29 LAB — CLOZAPINE (CLOZARIL)
Clozapine Lvl: 302 mcg/L
NorClozapine: 173 mcg/L (ref 25–400)

## 2018-09-30 ENCOUNTER — Other Ambulatory Visit (HOSPITAL_COMMUNITY): Payer: Self-pay

## 2018-09-30 DIAGNOSIS — F4310 Post-traumatic stress disorder, unspecified: Secondary | ICD-10-CM

## 2018-09-30 DIAGNOSIS — F200 Paranoid schizophrenia: Secondary | ICD-10-CM

## 2018-09-30 MED ORDER — CLOZAPINE 50 MG PO TABS: ORAL_TABLET | ORAL | 1 refills | Status: DC

## 2018-10-02 ENCOUNTER — Other Ambulatory Visit (HOSPITAL_COMMUNITY): Payer: Self-pay | Admitting: Psychiatry

## 2018-10-06 LAB — CBC WITH DIFFERENTIAL/PLATELET
Absolute Monocytes: 610 cells/uL (ref 200–950)
Basophils Absolute: 76 cells/uL (ref 0–200)
Basophils Relative: 0.6 %
Eosinophils Absolute: 127 cells/uL (ref 15–500)
Eosinophils Relative: 1 %
HCT: 42 % (ref 35.0–45.0)
Hemoglobin: 14.3 g/dL (ref 11.7–15.5)
Lymphs Abs: 2464 cells/uL (ref 850–3900)
MCH: 31.3 pg (ref 27.0–33.0)
MCHC: 34 g/dL (ref 32.0–36.0)
MCV: 91.9 fL (ref 80.0–100.0)
MPV: 10.8 fL (ref 7.5–12.5)
Monocytes Relative: 4.8 %
Neutro Abs: 9423 cells/uL — ABNORMAL HIGH (ref 1500–7800)
Neutrophils Relative %: 74.2 %
Platelets: 294 10*3/uL (ref 140–400)
RBC: 4.57 10*6/uL (ref 3.80–5.10)
RDW: 13 % (ref 11.0–15.0)
Total Lymphocyte: 19.4 %
WBC: 12.7 10*3/uL — ABNORMAL HIGH (ref 3.8–10.8)

## 2018-10-06 LAB — CLOZAPINE (CLOZARIL)
Clozapine Lvl: 175 mcg/L
NorClozapine: 134 mcg/L (ref 25–400)

## 2018-10-07 ENCOUNTER — Encounter (HOSPITAL_COMMUNITY): Payer: Self-pay | Admitting: Licensed Clinical Social Worker

## 2018-10-07 ENCOUNTER — Ambulatory Visit (INDEPENDENT_AMBULATORY_CARE_PROVIDER_SITE_OTHER): Payer: BC Managed Care – PPO | Admitting: Licensed Clinical Social Worker

## 2018-10-07 ENCOUNTER — Other Ambulatory Visit: Payer: Self-pay

## 2018-10-07 DIAGNOSIS — F4310 Post-traumatic stress disorder, unspecified: Secondary | ICD-10-CM | POA: Diagnosis not present

## 2018-10-07 DIAGNOSIS — F200 Paranoid schizophrenia: Secondary | ICD-10-CM | POA: Diagnosis not present

## 2018-10-07 NOTE — Progress Notes (Signed)
Virtual Visit via Video Note  I connected with Danielle [name] on 10/07/18 at  8:00 AM EDT by a video enabled telemedicine application and verified that I am speaking with the correct person using two identifiers.    I discussed the limitations of evaluation and management by telemedicine and the availability of in person appointments. The patient expressed understanding and agreed to proceed.  Type of Therapy: Individual Therapy   Treatment Goals addressed: Improve Psychiatric Symptoms, Emotional Regulation Skills, Calming Skills, Healthy Coping Skills, discuss and process traumatic event, reduced irrational worries and fears   Interventions: Motivational Interviewing, Cognitive Behavior Therapy and Grounding/Mindfulness Skills, psychoeducation   Summary: Danielle [name] is a 47 y.o. female who presents with Schizophrenia and PTSD.   Suicidal/Homicidal: No - without intent/plan   Therapist Response:  Danielle [name] met with clinician for an individual session.  Danielle [name] discussed her psychiatric symptoms, her current life events. Danielle [name] that she has been very depressed over the past 2 weeks. She identified that her son, Danielle [name], who has not been talking to her for over 1-2 years, verbalized ongoing disinterest in having a relationship with her. Clinician explored the cause of the initial rift and noted that Danielle [name] and husband had Danielle [name] arrested for robbing her and stealing/selling machinery from the garage. Clinician explored previous coping skills that had been helpful prior to the past two weeks. Danielle [name] reported she would listen to past voice messages from her son and she would try to not think about him. However, now she is unable to re-engage those skills. Clinician encouraged Danielle [name] to write Danielle [name] a letter, with no intention of sending it to him, but to process her feelings of hurt, sadness, anger, disappointment, sorrow, etc. Danielle [name] denied that she was "emotionally strong" enough to do this now, but she  would think about it.   Plan: Return again in 2 weeks. Danielle [name] requested a sooner appointment with Dr. Arfeen due to increased depression.   Diagnosis: Axis I: Schizophrenia, paranoid type and PTSD   I discussed the assessment and treatment plan with the patient. The patient was provided an opportunity to ask questions and all were answered. The patient agreed with the plan and demonstrated an understanding of the instructions.   The patient was advised to call back or seek an in-person evaluation if the symptoms worsen or if the condition fails to improve as anticipated.  I provided 45 minutes of non-face-to-face time during this encounter.    R , LCSW  

## 2018-10-08 ENCOUNTER — Encounter (HOSPITAL_COMMUNITY): Payer: Self-pay | Admitting: Psychiatry

## 2018-10-08 ENCOUNTER — Other Ambulatory Visit: Payer: Self-pay

## 2018-10-08 ENCOUNTER — Ambulatory Visit (INDEPENDENT_AMBULATORY_CARE_PROVIDER_SITE_OTHER): Payer: BC Managed Care – PPO | Admitting: Psychiatry

## 2018-10-08 DIAGNOSIS — F4310 Post-traumatic stress disorder, unspecified: Secondary | ICD-10-CM | POA: Diagnosis not present

## 2018-10-08 DIAGNOSIS — F200 Paranoid schizophrenia: Secondary | ICD-10-CM

## 2018-10-08 MED ORDER — MIRTAZAPINE 45 MG PO TABS: 45.0000 mg | ORAL_TABLET | Freq: Every day | ORAL | 1 refills | Status: DC

## 2018-10-08 MED ORDER — PRAZOSIN HCL 2 MG PO CAPS: 2.0000 mg | ORAL_CAPSULE | Freq: Every day | ORAL | 0 refills | Status: DC

## 2018-10-08 MED ORDER — CLOZAPINE 50 MG PO TABS: ORAL_TABLET | ORAL | 1 refills | Status: DC

## 2018-10-08 MED ORDER — CLONAZEPAM 0.5 MG PO TABS: 0.5000 mg | ORAL_TABLET | Freq: Three times a day (TID) | ORAL | 2 refills | Status: DC | PRN
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
## 2018-10-09 ENCOUNTER — Other Ambulatory Visit (HOSPITAL_COMMUNITY): Payer: Self-pay

## 2018-10-09 ENCOUNTER — Telehealth (HOSPITAL_COMMUNITY): Payer: Self-pay

## 2018-10-09 DIAGNOSIS — Z79899 Other long term (current) drug therapy: Secondary | ICD-10-CM

## 2018-10-09 NOTE — Telephone Encounter (Signed)
Patient's husband called and stated they went to the  Lab in [place] to get her bloodwork done for her clozapine. He stated [facility] told them that the order has expired. Thank you.

## 2018-10-21 ENCOUNTER — Encounter (HOSPITAL_COMMUNITY): Payer: Self-pay | Admitting: Licensed Clinical Social Worker

## 2018-10-21 ENCOUNTER — Ambulatory Visit (INDEPENDENT_AMBULATORY_CARE_PROVIDER_SITE_OTHER): Payer: BC Managed Care – PPO | Admitting: Licensed Clinical Social Worker

## 2018-10-21 ENCOUNTER — Other Ambulatory Visit: Payer: Self-pay

## 2018-10-21 DIAGNOSIS — F4310 Post-traumatic stress disorder, unspecified: Secondary | ICD-10-CM | POA: Diagnosis not present

## 2018-10-21 DIAGNOSIS — F200 Paranoid schizophrenia: Secondary | ICD-10-CM
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
## 2018-10-27 ENCOUNTER — Other Ambulatory Visit (HOSPITAL_COMMUNITY): Payer: Self-pay | Admitting: Psychiatry

## 2018-10-30 ENCOUNTER — Other Ambulatory Visit (HOSPITAL_COMMUNITY): Payer: Self-pay | Admitting: Psychiatry

## 2018-10-30 DIAGNOSIS — F4310 Post-traumatic stress disorder, unspecified: Secondary | ICD-10-CM

## 2018-10-30 DIAGNOSIS — F200 Paranoid schizophrenia: Secondary | ICD-10-CM

## 2018-10-31 LAB — CBC WITH DIFFERENTIAL/PLATELET
Absolute Monocytes: 520 cells/uL (ref 200–950)
Basophils Absolute: 64 cells/uL (ref 0–200)
Basophils Relative: 0.8 %
Eosinophils Absolute: 144 cells/uL (ref 15–500)
Eosinophils Relative: 1.8 %
HCT: 40.2 % (ref 35.0–45.0)
Hemoglobin: 13.7 g/dL (ref 11.7–15.5)
Lymphs Abs: 1608 cells/uL (ref 850–3900)
MCH: 31.3 pg (ref 27.0–33.0)
MCHC: 34.1 g/dL (ref 32.0–36.0)
MCV: 91.8 fL (ref 80.0–100.0)
MPV: 10.4 fL (ref 7.5–12.5)
Monocytes Relative: 6.5 %
Neutro Abs: 5664 cells/uL (ref 1500–7800)
Neutrophils Relative %: 70.8 %
Platelets: 263 10*3/uL (ref 140–400)
RBC: 4.38 10*6/uL (ref 3.80–5.10)
RDW: 13.1 % (ref 11.0–15.0)
Total Lymphocyte: 20.1 %
WBC: 8 10*3/uL (ref 3.8–10.8)

## 2018-10-31 LAB — CLOZAPINE (CLOZARIL)
Clozapine Lvl: 374 mcg/L
NorClozapine: 225 mcg/L (ref 25–400)

## 2018-11-02 ENCOUNTER — Ambulatory Visit (HOSPITAL_COMMUNITY): Payer: BLUE CROSS/BLUE SHIELD | Admitting: Psychiatry

## 2018-11-03 ENCOUNTER — Other Ambulatory Visit: Payer: Self-pay

## 2018-11-03 ENCOUNTER — Ambulatory Visit (HOSPITAL_COMMUNITY): Payer: BC Managed Care – PPO | Admitting: Licensed Clinical Social Worker

## 2018-11-03 ENCOUNTER — Ambulatory Visit (INDEPENDENT_AMBULATORY_CARE_PROVIDER_SITE_OTHER): Payer: BC Managed Care – PPO | Admitting: Licensed Clinical Social Worker

## 2018-11-03 ENCOUNTER — Encounter (HOSPITAL_COMMUNITY): Payer: Self-pay | Admitting: Licensed Clinical Social Worker

## 2018-11-03 DIAGNOSIS — F4310 Post-traumatic stress disorder, unspecified: Secondary | ICD-10-CM

## 2018-11-03 DIAGNOSIS — F200 Paranoid schizophrenia: Secondary | ICD-10-CM | POA: Diagnosis not present
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
## 2018-11-12 ENCOUNTER — Ambulatory Visit (HOSPITAL_COMMUNITY): Payer: BC Managed Care – PPO | Admitting: Licensed Clinical Social Worker

## 2018-11-13 ENCOUNTER — Other Ambulatory Visit (HOSPITAL_COMMUNITY): Payer: Self-pay | Admitting: Psychiatry

## 2018-11-17 ENCOUNTER — Ambulatory Visit (INDEPENDENT_AMBULATORY_CARE_PROVIDER_SITE_OTHER): Payer: BC Managed Care – PPO | Admitting: Licensed Clinical Social Worker

## 2018-11-17 ENCOUNTER — Encounter (HOSPITAL_COMMUNITY): Payer: Self-pay | Admitting: Licensed Clinical Social Worker

## 2018-11-17 ENCOUNTER — Other Ambulatory Visit: Payer: Self-pay

## 2018-11-17 DIAGNOSIS — F200 Paranoid schizophrenia: Secondary | ICD-10-CM | POA: Diagnosis not present

## 2018-11-17 DIAGNOSIS — F4310 Post-traumatic stress disorder, unspecified: Secondary | ICD-10-CM

## 2018-11-17 LAB — CBC WITH DIFFERENTIAL/PLATELET
Absolute Monocytes: 458 cells/uL (ref 200–950)
Basophils Absolute: 88 cells/uL (ref 0–200)
Basophils Relative: 1 %
Eosinophils Absolute: 150 cells/uL (ref 15–500)
Eosinophils Relative: 1.7 %
HCT: 41.2 % (ref 35.0–45.0)
Hemoglobin: 13.8 g/dL (ref 11.7–15.5)
Lymphs Abs: 1989 cells/uL (ref 850–3900)
MCH: 31.4 pg (ref 27.0–33.0)
MCHC: 33.5 g/dL (ref 32.0–36.0)
MCV: 93.8 fL (ref 80.0–100.0)
MPV: 10 fL (ref 7.5–12.5)
Monocytes Relative: 5.2 %
Neutro Abs: 6116 cells/uL (ref 1500–7800)
Neutrophils Relative %: 69.5 %
Platelets: 362 10*3/uL (ref 140–400)
RBC: 4.39 10*6/uL (ref 3.80–5.10)
RDW: 13 % (ref 11.0–15.0)
Total Lymphocyte: 22.6 %
WBC: 8.8 10*3/uL (ref 3.8–10.8)

## 2018-11-17 LAB — CLOZAPINE (CLOZARIL)
Clozapine Lvl: 221 mcg/L
NorClozapine: 142 mcg/L (ref 25–400)

## 2018-11-17 NOTE — Progress Notes (Signed)
Virtual Visit via Video Note  I connected with Danielle [name] on 11/17/18 at  8:00 AM EDT by a video enabled telemedicine application and verified that I am speaking with the correct person using two identifiers.    I discussed the limitations of evaluation and management by telemedicine and the availability of in person appointments. The patient expressed understanding and agreed to proceed.  Type of Therapy: Individual Therapy  Treatment Goals addressed: Improve Psychiatric Symptoms, Emotional Regulation Skills, Calming Skills, Healthy Coping Skills, discuss and process traumatic event, reduced irrational worries and fears  Interventions: Motivational Interviewing, Cognitive Behavior Therapy and Grounding/Mindfulness Skills, psychoeducation  Summary: Danielle [name] is a 48 y.o. female who presents with Schizophrenia and PTSD.  Suicidal/Homicidal: No - without intent/plan  Therapist Response:  Danielle [name] met with clinician for an individual session.  Danielle [name] discussed her psychiatric symptoms, her current life events. Danielle [name] shared that she has been feeling depressed for the past couple of days. She reports she turned 48 on Sunday, and that has been challenging, but the worst part is that her children did not visit her on her birthday. She reported that her husband took her out to dinner on Saturday night and they stopped by to see her son that evening. However, she only got a text from her daughter on her birthday and her son did not stop by like he promised. Danielle [name] reports a lot of worry about her daughter and recent dreams that her daughter is drowning and she cannot reach her. Clinician researched dream meanings and identified to Danielle [name] that drowning dreams may indicate a sense of helplessness and inability to change the course or control situations. Danielle [name] agreed that this was how she felt about her daughter, who continues to make bad choices despite the fact she is just now out of jail and on  probation. Clinician provided supportive counseling and reality testing about the level of control Danielle [name] has over her adult daughter. She reports feeling mostly sad about how her children have turned out.  Clinician discussed plans to attend a family wedding in [place] next month. Clinician ensured that Danielle [name] and husband wear masks and keep social distances to make sure they stay safe and healthy.   Plan: Return again in 2 weeks.  Diagnosis: Axis I: Schizophrenia, paranoid type and PTSD    I discussed the assessment and treatment plan with the patient. The patient was provided an opportunity to ask questions and all were answered. The patient agreed with the plan and demonstrated an understanding of the instructions.   The patient was advised to call back or seek an in-person evaluation if the symptoms worsen or if the condition fails to improve as anticipated.  I provided 45 minutes of non-face-to-face time during this encounter.    R , LCSW  

## 2018-11-27 ENCOUNTER — Other Ambulatory Visit (HOSPITAL_COMMUNITY): Payer: Self-pay | Admitting: Psychiatry

## 2018-12-01 ENCOUNTER — Other Ambulatory Visit: Payer: Self-pay

## 2018-12-01 ENCOUNTER — Encounter (HOSPITAL_COMMUNITY): Payer: Self-pay | Admitting: Licensed Clinical Social Worker

## 2018-12-01 ENCOUNTER — Ambulatory Visit (INDEPENDENT_AMBULATORY_CARE_PROVIDER_SITE_OTHER): Payer: BC Managed Care – PPO | Admitting: Licensed Clinical Social Worker

## 2018-12-01 DIAGNOSIS — F200 Paranoid schizophrenia: Secondary | ICD-10-CM | POA: Diagnosis not present

## 2018-12-01 DIAGNOSIS — F4310 Post-traumatic stress disorder, unspecified: Secondary | ICD-10-CM

## 2018-12-01 LAB — CBC WITH DIFFERENTIAL/PLATELET
Absolute Monocytes: 446 cells/uL (ref 200–950)
Basophils Absolute: 57 cells/uL (ref 0–200)
Basophils Relative: 0.7 %
Eosinophils Absolute: 203 cells/uL (ref 15–500)
Eosinophils Relative: 2.5 %
HCT: 43.1 % (ref 35.0–45.0)
Hemoglobin: 14.2 g/dL (ref 11.7–15.5)
Lymphs Abs: 1798 cells/uL (ref 850–3900)
MCH: 31 pg (ref 27.0–33.0)
MCHC: 32.9 g/dL (ref 32.0–36.0)
MCV: 94.1 fL (ref 80.0–100.0)
MPV: 10.5 fL (ref 7.5–12.5)
Monocytes Relative: 5.5 %
Neutro Abs: 5597 cells/uL (ref 1500–7800)
Neutrophils Relative %: 69.1 %
Platelets: 253 10*3/uL (ref 140–400)
RBC: 4.58 10*6/uL (ref 3.80–5.10)
RDW: 13.1 % (ref 11.0–15.0)
Total Lymphocyte: 22.2 %
WBC: 8.1 10*3/uL (ref 3.8–10.8)

## 2018-12-01 LAB — CLOZAPINE (CLOZARIL)
Clozapine Lvl: 285 mcg/L
NorClozapine: 190 mcg/L (ref 25–400)

## 2018-12-01 NOTE — Progress Notes (Signed)
Virtual Visit via Video Note  I connected with Danielle [name] on 12/01/18 at  8:00 AM EDT by a video enabled telemedicine application and verified that I am speaking with the correct person using two identifiers.    I discussed the limitations of evaluation and management by telemedicine and the availability of in person appointments. The patient expressed understanding and agreed to proceed.   Treatment Goals addressed: Improve Psychiatric Symptoms, Emotional Regulation Skills, Calming Skills, Healthy Coping Skills, discuss and process traumatic event, reduced irrational worries and fears  Interventions: Motivational Interviewing, Cognitive Behavior Therapy and Grounding/Mindfulness Skills, psychoeducation  Summary: Danielle [name] is a 48 y.o. female who presents with Schizophrenia and PTSD.  Suicidal/Homicidal: No - without intent/plan  Therapist Response:  Danielle [name] met with clinician for an individual session. Danielle [name] discussed her psychiatric symptoms, her current life events and her homework. Danielle [name] shared that she has been doing okay. She reports a couple of episodes of panic attacks per week and also some episodes of "blindness", where she will be outside and then when she comes in she cannot see for a few minutes up to a few hours. Clinician encouraged her to speak with Dr. Adele Schilder about this, as it is a newer symptom. Clinician explored stress levels and concerns in her household. Danielle [name] reports that her husband's job has cut back on some of his hours, which will cause some financial hardship. 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 Clinician explored the feasibility of taking on a job like this with her health and emotional concerns. Danielle [name] reports that due to the nature of this job, she could pick and choose to work whenever she wants. Clinician discussed support from husband in this venture and noted that he is on board with whatever  she decides. Danielle [name] reports they will be going on a camping trip next week in [place] for her husband's niece's wedding. Clinician encouraged Danielle [name] to maintain social distancing and wearing a mask as much as possible.   Plan: Return again in 2 weeks.  Diagnosis: Axis I: Schizophrenia, paranoid type and PTSD    I discussed the assessment and treatment plan with the patient. The patient was provided an opportunity to ask questions and all were answered. The patient agreed with the plan and demonstrated an understanding of the instructions.   The patient was advised to call back or seek an in-person evaluation if the symptoms worsen or if the condition fails to improve as anticipated.  I provided 45 minutes of non-face-to-face time during this encounter.   [name], LCSW

## 2018-12-04 ENCOUNTER — Telehealth (HOSPITAL_COMMUNITY): Payer: Self-pay

## 2018-12-04 DIAGNOSIS — F200 Paranoid schizophrenia: Secondary | ICD-10-CM

## 2018-12-04 DIAGNOSIS — F4310 Post-traumatic stress disorder, unspecified: Secondary | ICD-10-CM

## 2018-12-04 MED ORDER — MIRTAZAPINE 45 MG PO TABS: 45.0000 mg | ORAL_TABLET | Freq: Every day | ORAL | 0 refills | Status: DC

## 2018-12-04 NOTE — Telephone Encounter (Signed)
Patients husband called, he was wanting a refill on the Omeprazole, I advised him to call PCP.

## 2018-12-08 ENCOUNTER — Encounter (HOSPITAL_COMMUNITY): Payer: Self-pay | Admitting: Psychiatry

## 2018-12-08 ENCOUNTER — Other Ambulatory Visit: Payer: Self-pay

## 2018-12-08 ENCOUNTER — Ambulatory Visit (INDEPENDENT_AMBULATORY_CARE_PROVIDER_SITE_OTHER): Payer: BC Managed Care – PPO | Admitting: Psychiatry

## 2018-12-08 DIAGNOSIS — F419 Anxiety disorder, unspecified: Secondary | ICD-10-CM | POA: Diagnosis not present

## 2018-12-08 DIAGNOSIS — F4310 Post-traumatic stress disorder, unspecified: Secondary | ICD-10-CM | POA: Diagnosis not present

## 2018-12-08 DIAGNOSIS — F200 Paranoid schizophrenia: Secondary | ICD-10-CM | POA: Diagnosis not present

## 2018-12-08 MED ORDER — MIRTAZAPINE 45 MG PO TABS: 45.0000 mg | ORAL_TABLET | Freq: Every day | ORAL | 0 refills | Status: DC

## 2018-12-08 MED ORDER — CLONAZEPAM 0.5 MG PO TABS: 0.5000 mg | ORAL_TABLET | Freq: Three times a day (TID) | ORAL | 2 refills | Status: DC | PRN

## 2018-12-08 MED ORDER — PRAZOSIN HCL 2 MG PO CAPS: 2.0000 mg | ORAL_CAPSULE | Freq: Every day | ORAL | 0 refills | Status: DC

## 2018-12-08 MED ORDER — CLOZAPINE 50 MG PO TABS: ORAL_TABLET | ORAL | 2 refills | Status: DC

## 2018-12-08 NOTE — Progress Notes (Signed)
Virtual Visit via Telephone Note  I connected with Danielle [name] on 12/08/18 at  9:00 AM EDT by telephone and verified that I am speaking with the correct person using two identifiers.   I discussed the limitations, risks, security and privacy concerns of performing an evaluation and management service by telephone and the availability of in person appointments. I also discussed with the patient that there may be a patient responsible charge related to this service. The patient expressed understanding and agreed to proceed.   History of Present Illness: Patient was evaluated by phone session.  On her last visit we increase Remeron to 45 mg because she was experiencing a lot of anxiety and poor sleep.  Since the dose adjusted she is doing better.  She is less anxious and her sleep is improved.  She has no more dizziness fall or any fatigue.  I also spoke to her husband Delton Seeelson also agreed the patient is doing much better.  She still have residual nightmares and flashback and get very anxious when she is by herself but she denies any agitation or any recent crying spells.  She was upset on her daughter but lately her daughter is dropping her dog few times a week.  She is getting therapy from [facility].  She is tolerating all her medication including Remeron, Klonopin, clozapine and Minipress.  She is getting blood work recently.  Her last blood work shows normal CBC and her clozapine was 285.  Her energy level is fair.  She is a good support from her husband.  She has residual paranoia but since the clozapine dose adjusted few months ago she is handling her paranoia much better.  Her appetite is okay.  She reported her weight is a stable.   Past Psychiatric History:Reviewed. H/Omultiple hospitalizationdue tomultiple suicidal attempt, psychosis, paranoia, hallucination and severe depression. H/Odrowning herself.Last admission inOctober 2019 at [facility].Tred multiple medications  includesZoloft, amitriptyline, Wellbutrin, Latuda, Xanax, Valium, Risperdal, lithium, Haldol, Depakote, Zyprexa, Geodon, imipramine,InVega, temazepamand Paxil, lithium, loxapineand lamictal.Referred AACTteambutnot approved.   Recent Results (from the past 2160 hour(s))  Clozapine (clozaril)     Status: None   Collection Time: 09/11/18 11:27 AM  Result Value Ref Range   NorClozapine 152 25 - 400 mcg/L   Clozapine Lvl 258 mcg/L    Comment: . Reference Range for Clozapine: . The therapeutic response begins to appear at 100 mcg/L.  Refractory schizophrenia appears to require a therapeutic concentration of at least 350 mcg/L (trough, at steady state). . Toxic range: Greater than 1000 mcg/L . This test was developed and its analytical performance characteristics have been determined by [facility] [place] [place]. It has not been cleared or approved by the U.S. Food and Drug Administration. This assay has been validated pursuant to the CLIA regulations and is used for clinical purposes. Marland Kitchen.   CBC with Differential/Platelet     Status: None   Collection Time: 09/11/18 11:27 AM  Result Value Ref Range   WBC 9.8 3.8 - 10.8 Thousand/uL   RBC 4.80 3.80 - 5.10 Million/uL   Hemoglobin 14.6 11.7 - 15.5 g/dL   HCT 40.943.4 81.135.0 - 91.445.0 %   MCV 90.4 80.0 - 100.0 fL   MCH 30.4 27.0 - 33.0 pg   MCHC 33.6 32.0 - 36.0 g/dL   RDW 78.212.7 95.611.0 - 21.315.0 %   Platelets 311 140 - 400 Thousand/uL   MPV 10.6 7.5 - 12.5 fL   Neutro Abs 6,007 1,500 - 7,800 cells/uL  Lymphs Abs 3,126 850 - 3,900 cells/uL   Absolute Monocytes 431 200 - 950 cells/uL   Eosinophils Absolute 167 15 - 500 cells/uL   Basophils Absolute 69 0 - 200 cells/uL   Neutrophils Relative % 61.3 %   Total Lymphocyte 31.9 %   Monocytes Relative 4.4 %   Eosinophils Relative 1.7 %   Basophils Relative 0.7 %  Clozapine (clozaril)     Status: None   Collection Time: 09/25/18  8:31 AM  Result Value Ref Range    NorClozapine 173 25 - 400 mcg/L   Clozapine Lvl 302 mcg/L    Comment: . Reference Range for Clozapine: . The therapeutic response begins to appear at 100 mcg/L.  Refractory schizophrenia appears to require a therapeutic concentration of at least 350 mcg/L (trough, at steady state). . Toxic range: Greater than 1000 mcg/L . This test was developed and its analytical performance characteristics have been determined by [facility] [place]. It has not been cleared or approved by the U.S. Food and Drug Administration. This assay has been validated pursuant to the CLIA regulations and is used for clinical purposes. Marland Kitchen.   CBC with Differential/Platelet     Status: None   Collection Time: 09/25/18  8:31 AM  Result Value Ref Range   WBC 8.6 3.8 - 10.8 Thousand/uL   RBC 4.78 3.80 - 5.10 Million/uL   Hemoglobin 14.9 11.7 - 15.5 g/dL   HCT 16.143.5 09.635.0 - 04.545.0 %   MCV 91.0 80.0 - 100.0 fL   MCH 31.2 27.0 - 33.0 pg   MCHC 34.3 32.0 - 36.0 g/dL   RDW 40.912.7 81.111.0 - 91.415.0 %   Platelets 303 140 - 400 Thousand/uL   MPV 10.4 7.5 - 12.5 fL   Neutro Abs 5,951 1,500 - 7,800 cells/uL   Lymphs Abs 2,073 850 - 3,900 cells/uL   Absolute Monocytes 387 200 - 950 cells/uL   Eosinophils Absolute 129 15 - 500 cells/uL   Basophils Absolute 60 0 - 200 cells/uL   Neutrophils Relative % 69.2 %   Total Lymphocyte 24.1 %   Monocytes Relative 4.5 %   Eosinophils Relative 1.5 %   Basophils Relative 0.7 %  Clozapine (clozaril)     Status: None   Collection Time: 10/02/18  2:15 PM  Result Value Ref Range   NorClozapine 134 25 - 400 mcg/L   Clozapine Lvl 175 mcg/L    Comment: . Reference Range for Clozapine: . The therapeutic response begins to appear at 100 mcg/L.  Refractory schizophrenia appears to require a therapeutic concentration of at least 350 mcg/L (trough, at steady state). . Toxic range: Greater than 1000 mcg/L . This test was developed and its analytical  performance characteristics have been determined by [facility] [place], [place]. It has not been cleared or approved by the U.S. Food and Drug Administration. This assay has been validated pursuant to the CLIA regulations and is used for clinical purposes. Marland Kitchen.   CBC with Differential/Platelet     Status: Abnormal   Collection Time: 10/02/18  2:15 PM  Result Value Ref Range   WBC 12.7 (H) 3.8 - 10.8 Thousand/uL   RBC 4.57 3.80 - 5.10 Million/uL   Hemoglobin 14.3 11.7 - 15.5 g/dL   HCT 78.242.0 95.635.0 - 21.345.0 %   MCV 91.9 80.0 - 100.0 fL   MCH 31.3 27.0 - 33.0 pg   MCHC 34.0 32.0 - 36.0 g/dL   RDW 08.613.0 57.811.0 - 46.915.0 %  Platelets 294 140 - 400 Thousand/uL   MPV 10.8 7.5 - 12.5 fL   Neutro Abs 9,423 (H) 1,500 - 7,800 cells/uL   Lymphs Abs 2,464 850 - 3,900 cells/uL   Absolute Monocytes 610 200 - 950 cells/uL   Eosinophils Absolute 127 15 - 500 cells/uL   Basophils Absolute 76 0 - 200 cells/uL   Neutrophils Relative % 74.2 %   Total Lymphocyte 19.4 %   Monocytes Relative 4.8 %   Eosinophils Relative 1.0 %   Basophils Relative 0.6 %  Clozapine (clozaril)     Status: None   Collection Time: 10/27/18  2:48 PM  Result Value Ref Range   NorClozapine 225 25 - 400 mcg/L   Clozapine Lvl 374 mcg/L    Comment: . Reference Range for Clozapine: . The therapeutic response begins to appear at 100 mcg/L.  Refractory schizophrenia appears to require a therapeutic concentration of at least 350 mcg/L (trough, at steady state). . Toxic range: Greater than 1000 mcg/L . This test was developed and its analytical performance characteristics have been determined by [place], [place]. It has not been cleared or approved by the U.S. Food and Drug Administration. This assay has been validated pursuant to the CLIA regulations and is used for clinical purposes. Marland Kitchen   CBC with Differential/Platelet     Status: None   Collection Time: 10/27/18  2:48 PM   Result Value Ref Range   WBC 8.0 3.8 - 10.8 Thousand/uL   RBC 4.38 3.80 - 5.10 Million/uL   Hemoglobin 13.7 11.7 - 15.5 g/dL   HCT 40.2 35.0 - 45.0 %   MCV 91.8 80.0 - 100.0 fL   MCH 31.3 27.0 - 33.0 pg   MCHC 34.1 32.0 - 36.0 g/dL   RDW 13.1 11.0 - 15.0 %   Platelets 263 140 - 400 Thousand/uL   MPV 10.4 7.5 - 12.5 fL   Neutro Abs 5,664 1,500 - 7,800 cells/uL   Lymphs Abs 1,608 850 - 3,900 cells/uL   Absolute Monocytes 520 200 - 950 cells/uL   Eosinophils Absolute 144 15 - 500 cells/uL   Basophils Absolute 64 0 - 200 cells/uL   Neutrophils Relative % 70.8 %   Total Lymphocyte 20.1 %   Monocytes Relative 6.5 %   Eosinophils Relative 1.8 %   Basophils Relative 0.8 %  Clozapine (clozaril)     Status: None   Collection Time: 11/13/18  8:39 AM  Result Value Ref Range   NorClozapine 142 25 - 400 mcg/L   Clozapine Lvl 221 mcg/L    Comment: . Reference Range for Clozapine: . The therapeutic response begins to appear at 100 mcg/L.  Refractory schizophrenia appears to require a therapeutic concentration of at least 350 mcg/L (trough, at steady state). . Toxic range: Greater than 1000 mcg/L . This test was developed and its analytical performance characteristics have been determined by [place], [place]. It has not been cleared or approved by the U.S. Food and Drug Administration. This assay has been validated pursuant to the CLIA regulations and is used for clinical purposes. Marland Kitchen   CBC with Differential/Platelet     Status: None   Collection Time: 11/13/18  8:39 AM  Result Value Ref Range   WBC 8.8 3.8 - 10.8 Thousand/uL   RBC 4.39 3.80 - 5.10 Million/uL   Hemoglobin 13.8 11.7 - 15.5 g/dL   HCT 41.2 35.0 - 45.0 %   MCV 93.8 80.0 - 100.0 fL  MCH 31.4 27.0 - 33.0 pg   MCHC 33.5 32.0 - 36.0 g/dL   RDW 16.1 09.6 - 04.5 %   Platelets 362 140 - 400 Thousand/uL   MPV 10.0 7.5 - 12.5 fL   Neutro Abs 6,116 1,500 - 7,800 cells/uL   Lymphs Abs  1,989 850 - 3,900 cells/uL   Absolute Monocytes 458 200 - 950 cells/uL   Eosinophils Absolute 150 15 - 500 cells/uL   Basophils Absolute 88 0 - 200 cells/uL   Neutrophils Relative % 69.5 %   Total Lymphocyte 22.6 %   Monocytes Relative 5.2 %   Eosinophils Relative 1.7 %   Basophils Relative 1.0 %  Clozapine (clozaril)     Status: None   Collection Time: 11/27/18  9:52 AM  Result Value Ref Range   NorClozapine 190 25 - 400 mcg/L   Clozapine Lvl 285 mcg/L    Comment: . Reference Range for Clozapine: . The therapeutic response begins to appear at 100 mcg/L.  Refractory schizophrenia appears to require a therapeutic concentration of at least 350 mcg/L (trough, at steady state). . Toxic range: Greater than 1000 mcg/L . This test was developed and its analytical performance characteristics have been determined by [facility] [place], [place]. It has not been cleared or approved by the U.S. Food and Drug Administration. This assay has been validated pursuant to the CLIA regulations and is used for clinical purposes. Marland Kitchen   CBC with Differential/Platelet     Status: None   Collection Time: 11/27/18  9:52 AM  Result Value Ref Range   WBC 8.1 3.8 - 10.8 Thousand/uL   RBC 4.58 3.80 - 5.10 Million/uL   Hemoglobin 14.2 11.7 - 15.5 g/dL   HCT 40.9 81.1 - 91.4 %   MCV 94.1 80.0 - 100.0 fL   MCH 31.0 27.0 - 33.0 pg   MCHC 32.9 32.0 - 36.0 g/dL   RDW 78.2 95.6 - 21.3 %   Platelets 253 140 - 400 Thousand/uL   MPV 10.5 7.5 - 12.5 fL   Neutro Abs 5,597 1,500 - 7,800 cells/uL   Lymphs Abs 1,798 850 - 3,900 cells/uL   Absolute Monocytes 446 200 - 950 cells/uL   Eosinophils Absolute 203 15 - 500 cells/uL   Basophils Absolute 57 0 - 200 cells/uL   Neutrophils Relative % 69.1 %   Total Lymphocyte 22.2 %   Monocytes Relative 5.5 %   Eosinophils Relative 2.5 %   Basophils Relative 0.7 %     Psychiatric Specialty Exam: Physical Exam  ROS  There were no vitals taken  for this visit.There is no height or weight on file to calculate BMI.  General Appearance: NA  Eye Contact:  NA  Speech:  fast and rambling  Volume:  Decreased  Mood:  Anxious  Affect:  NA  Thought Process:  Descriptions of Associations: Circumstantial  Orientation:  Full (Time, Place, and Person)  Thought Content:  Paranoid Ideation and night mare  Suicidal Thoughts:  No  Homicidal Thoughts:  No  Memory:  Immediate;   Fair Recent;   Fair Remote;   Fair  Judgement:  Fair  Insight:  Present  Psychomotor Activity:  NA  Concentration:  Concentration: Fair and Attention Span: Fair  Recall:  [employer] of Knowledge:  Fair  Language:  Fair  Akathisia:  NA  Handed:  Right  AIMS (if indicated):     Assets:  Desire for Improvement Housing Social Support  ADL's:  Intact  Cognition:  Impaired,  Mild  Sleep:   ok      Assessment and Plan: Schizophrenia chronic paranoid type.  Posttraumatic stress disorder.  Anxiety.  Patient doing better since Remeron dose increase.  She is sleeping better.  I reviewed blood work results and her CBC is normal.  Encouraged to continue therapy with [facility].  Continue Remeron 45 mg at bedtime, Minipress 2 mg at bedtime, clozapine 50 mg in the morning 100 in the afternoon and 100 at bedtime and Klonopin 0.5 mg 3 times a day.  Discussed medication side effects and benefits.  Recommended to call us back if she is any question or any concern.  Follow-up in 3 months.  Follow Up Instructions:    I discussed the assessment and treatment plan with the patient. The patient was provided an opportunity to ask questions and all were answered. The patient agreed with the plan and demonstrated an understanding of the instructions.   The patient was advised to call back or seek an in-person evaluation if the symptoms worsen or if the condition fails to improve as anticipated.  I provided 20 minutes of non-face-to-face time during this encounter.   Cleotis NipperSyed T Bertram Haddix,  MD

## 2018-12-09 ENCOUNTER — Other Ambulatory Visit (HOSPITAL_COMMUNITY): Payer: Self-pay | Admitting: Psychiatry

## 2018-12-14 LAB — CBC WITH DIFFERENTIAL/PLATELET
Absolute Monocytes: 519 cells/uL (ref 200–950)
Basophils Absolute: 88 cells/uL (ref 0–200)
Basophils Relative: 0.9 %
Eosinophils Absolute: 127 cells/uL (ref 15–500)
Eosinophils Relative: 1.3 %
HCT: 40.8 % (ref 35.0–45.0)
Hemoglobin: 13.5 g/dL (ref 11.7–15.5)
Lymphs Abs: 1970 cells/uL (ref 850–3900)
MCH: 31.3 pg (ref 27.0–33.0)
MCHC: 33.1 g/dL (ref 32.0–36.0)
MCV: 94.4 fL (ref 80.0–100.0)
MPV: 10.8 fL (ref 7.5–12.5)
Monocytes Relative: 5.3 %
Neutro Abs: 7095 cells/uL (ref 1500–7800)
Neutrophils Relative %: 72.4 %
Platelets: 252 10*3/uL (ref 140–400)
RBC: 4.32 10*6/uL (ref 3.80–5.10)
RDW: 13.1 % (ref 11.0–15.0)
Total Lymphocyte: 20.1 %
WBC: 9.8 10*3/uL (ref 3.8–10.8)

## 2018-12-14 LAB — CLOZAPINE (CLOZARIL)
Clozapine Lvl: 426 mcg/L
NorClozapine: 261 mcg/L (ref 25–400)

## 2018-12-15 ENCOUNTER — Encounter (HOSPITAL_COMMUNITY): Payer: Self-pay | Admitting: Licensed Clinical Social Worker

## 2018-12-15 ENCOUNTER — Ambulatory Visit (INDEPENDENT_AMBULATORY_CARE_PROVIDER_SITE_OTHER): Payer: BC Managed Care – PPO | Admitting: Licensed Clinical Social Worker

## 2018-12-15 ENCOUNTER — Other Ambulatory Visit: Payer: Self-pay

## 2018-12-15 DIAGNOSIS — F4310 Post-traumatic stress disorder, unspecified: Secondary | ICD-10-CM

## 2018-12-15 DIAGNOSIS — F200 Paranoid schizophrenia: Secondary | ICD-10-CM | POA: Diagnosis not present

## 2018-12-15 NOTE — Progress Notes (Signed)
Virtual Visit via Video Note  I connected with Danielle [name] on 12/15/18 at  8:00 AM EDT by a video enabled telemedicine application and verified that I am speaking with the correct person using two identifiers.     I discussed the limitations of evaluation and management by telemedicine and the availability of in person appointments. The patient expressed understanding and agreed to proceed.  Type of Therapy: Individual Therapy  Treatment Goals addressed: Improve Psychiatric Symptoms, Emotional Regulation Skills, Calming Skills, Healthy Coping Skills, discuss and process traumatic event, reduced irrational worries and fears  Interventions: Motivational Interviewing, Cognitive Behavior Therapy and Grounding/Mindfulness Skills, psychoeducation  Summary: Danielle [name] is a 48 y.o. female who presents with Schizophrenia and PTSD.  Suicidal/Homicidal: No - without intent/plan  Therapist Response:  Darwin met with clinician for an individual session. Danielle [name] discussed her psychiatric symptoms, her current life events. Danielle [name] shared that they had a great time on their camping trip in [place] and they enjoyed the wedding. However, she reported that on Saturday, she had a few seizures in a row, which made it a little more difficult to enjoy her trip. Clinician explored highs and lows of the trip and noted Danielle [name]'s compassion and empathy for how hard her husband works through his daily life plus the work he does for the family vacations, setting up, packing up, driving, loading, unloading, etc. Danielle [name] also processed concerns about finances, as his overtime will be cut back by 20 hours per pay period. Clinician explored options for applying for disability, as his earnings will be reduced and she is clinically and medically disabled. Danielle [name] agreed to look into it and will consider applying.   Plan: Return again in 2 weeks.  Diagnosis: Axis I: Schizophrenia, paranoid type and PTSD    I discussed the assessment  and treatment plan with the patient. The patient was provided an opportunity to ask questions and all were answered. The patient agreed with the plan and demonstrated an understanding of the instructions.   The patient was advised to call back or seek an in-person evaluation if the symptoms worsen or if the condition fails to improve as anticipated.  I provided 30 minutes of non-face-to-face time during this encounter.    R , LCSW  

## 2018-12-25 ENCOUNTER — Other Ambulatory Visit (HOSPITAL_COMMUNITY): Payer: Self-pay | Admitting: Psychiatry

## 2018-12-28 LAB — CBC WITH DIFFERENTIAL/PLATELET
Absolute Monocytes: 510 cells/uL (ref 200–950)
Basophils Absolute: 68 cells/uL (ref 0–200)
Basophils Relative: 0.9 %
Eosinophils Absolute: 120 cells/uL (ref 15–500)
Eosinophils Relative: 1.6 %
HCT: 40.9 % (ref 35.0–45.0)
Hemoglobin: 14 g/dL (ref 11.7–15.5)
Lymphs Abs: 1898 cells/uL (ref 850–3900)
MCH: 31.3 pg (ref 27.0–33.0)
MCHC: 34.2 g/dL (ref 32.0–36.0)
MCV: 91.3 fL (ref 80.0–100.0)
MPV: 11.6 fL (ref 7.5–12.5)
Monocytes Relative: 6.8 %
Neutro Abs: 4905 cells/uL (ref 1500–7800)
Neutrophils Relative %: 65.4 %
Platelets: 275 10*3/uL (ref 140–400)
RBC: 4.48 10*6/uL (ref 3.80–5.10)
RDW: 12.8 % (ref 11.0–15.0)
Total Lymphocyte: 25.3 %
WBC: 7.5 10*3/uL (ref 3.8–10.8)

## 2018-12-28 LAB — CLOZAPINE (CLOZARIL)
Clozapine Lvl: 351 mcg/L
NorClozapine: 195 mcg/L (ref 25–400)

## 2018-12-29 ENCOUNTER — Ambulatory Visit (INDEPENDENT_AMBULATORY_CARE_PROVIDER_SITE_OTHER): Payer: BC Managed Care – PPO | Admitting: Licensed Clinical Social Worker

## 2018-12-29 ENCOUNTER — Other Ambulatory Visit: Payer: Self-pay

## 2018-12-29 ENCOUNTER — Encounter (HOSPITAL_COMMUNITY): Payer: Self-pay | Admitting: Licensed Clinical Social Worker

## 2018-12-29 DIAGNOSIS — F4310 Post-traumatic stress disorder, unspecified: Secondary | ICD-10-CM | POA: Diagnosis not present

## 2018-12-29 DIAGNOSIS — F200 Paranoid schizophrenia: Secondary | ICD-10-CM | POA: Diagnosis not present

## 2018-12-29 NOTE — Progress Notes (Signed)
Virtual Visit via Video Note  I connected with Danielle [name] on 12/29/18 at  8:00 AM EDT by a video enabled telemedicine application and verified that I am speaking with the correct person using two identifiers.     I discussed the limitations of evaluation and management by telemedicine and the availability of in person appointments. The patient expressed understanding and agreed to proceed.   Type of Therapy: Individual Therapy  Treatment Goals addressed: Improve Psychiatric Symptoms, Emotional Regulation Skills, Calming Skills, Healthy Coping Skills, discuss and process traumatic event, reduced irrational worries and fears  Interventions: Motivational Interviewing, Cognitive Behavior Therapy and Grounding/Mindfulness Skills, psychoeducation  Summary: Danielle [name] is a 48 y.o. female who presents with Schizophrenia and PTSD.  Suicidal/Homicidal: No - without intent/plan  Therapist Response:  [name] met with clinician for an individual session.Danielle [name] discussed her psychiatric symptoms, her current life events and her homework. [name] shared that she has been extremely stressed about her finances and reports she has been having more hallucinations, both visual and auditory. Clinician explored sxs and noted the impact of stress on her mental illness. Clinician discussed options to apply for disability, which has been addressed several times over the therapeutic relationship (2 years). Danielle [name] reports she will apply and needed some documentation. Clinician provided the website for [facility] and will have the previous AVS from Dr. Adele Schilder sent to her.  Clinician reviewed coping skills, encouraged her to schedule a sooner med appointment to address meds, and to process ways to reduce stress. Danielle [name] was tearful and distracted in session. She is not suicidal, but feels hopeless, and the voices are telling her she is worthless, stupid, etc. She also felt scared when seeing the "shadow people", even though she  knows it's a hallucination.    Plan: Return again in 2 weeks.  Diagnosis: Axis I: Schizophrenia, paranoid type and PTSD I discussed the assessment and treatment plan with the patient. The patient was provided an opportunity to ask questions and all were answered. The patient agreed with the plan and demonstrated an understanding of the instructions.   The patient was advised to call back or seek an in-person evaluation if the symptoms worsen or if the condition fails to improve as anticipated.  I provided 45 minutes of non-face-to-face time during this encounter.   [name], LCSW

## 2019-01-01 ENCOUNTER — Other Ambulatory Visit: Payer: Self-pay

## 2019-01-01 ENCOUNTER — Encounter (HOSPITAL_COMMUNITY): Payer: Self-pay | Admitting: Psychiatry

## 2019-01-01 ENCOUNTER — Ambulatory Visit (INDEPENDENT_AMBULATORY_CARE_PROVIDER_SITE_OTHER): Payer: BC Managed Care – PPO | Admitting: Psychiatry

## 2019-01-01 DIAGNOSIS — F200 Paranoid schizophrenia: Secondary | ICD-10-CM | POA: Diagnosis not present

## 2019-01-01 DIAGNOSIS — F419 Anxiety disorder, unspecified: Secondary | ICD-10-CM

## 2019-01-01 DIAGNOSIS — F4310 Post-traumatic stress disorder, unspecified: Secondary | ICD-10-CM | POA: Diagnosis not present

## 2019-01-01 MED ORDER — CLOZAPINE 50 MG PO TABS: ORAL_TABLET | ORAL | 2 refills | Status: DC

## 2019-01-01 NOTE — Progress Notes (Signed)
Virtual Visit via Telephone Note  I connected with Saralynn S Ebron on 01/01/19 at  9:20 AM EDT by telephone and verified that I am speaking with the correct person using two identifiers.   I discussed the limitations, risks, security and privacy concerns of performing an evaluation and management service by telephone and the availability of in person appointments. I also discussed with the patient that there may be a patient responsible charge related to this service. The patient expressed understanding and agreed to proceed.   History of Present Illness: Patient was evaluated by phone session.  This is the earliest appointment she requested as patient is experiencing increased anxiety, hallucination, paranoia and having passive and fleeting suicidal thoughts but no plan.  Her husband Delton Seeelson was also there who reported that patient is been experiencing increased paranoia recently.  She is sleeping on and off.  She is calling her husband at work.  She is not sure what triggered these thoughts but lately her husband has less hours and patient is concerned about finances.  Her daughter still not in touch with them and they believe she is doing drugs.  She used to drop some time her dogs but lately she has not done.  She is compliant with Klonopin, clozapine, Minipress and Remeron.  She reported these hallucinations are usually at night and she hears that people calling her name.  She also seeing shadows and having nightmares.  She admitted crying spells but denies any active suicidal thoughts or any homicidal thoughts.  She complains of drooling in the morning which could be due to clozapine.  She denies any agitation, anger or any severe mood swing.  I spoke to her husband Delton Seeelson who also contributed the same information.  Patient appears more anxious and rambling in her speech.  I have to ask the question multiple times to get the answer as she tends to get ramble very quickly.  She had a good support from her  husband.  Her appetite is okay.  She do not report any change in her weight since the last visit.  She is in therapy with Shanda BumpsJessica which is going well.  She reported no tremors, shakes or any EPS.   Past Psychiatric History:Reviewed. H/Omultiple hospitalizationdue tomultiple suicidal attempt, psychosis, paranoia, hallucination and severe depression. H/Odrowning herself.Last admission inOctober 2019 at [facility].Tred multiple medications includesZoloft, amitriptyline, Wellbutrin, Latuda, Xanax, Valium, Risperdal, lithium, Haldol, Depakote, Zyprexa, Geodon, imipramine,InVega, temazepamand Paxil, lithium, loxapineand lamictal.Referred AACTteambutnot approved.   Recent Results (from the past 2160 hour(s))  Clozapine (clozaril)     Status: None   Collection Time: 10/27/18  2:48 PM  Result Value Ref Range   NorClozapine 225 25 - 400 mcg/L   Clozapine Lvl 374 mcg/L    Comment: . Reference Range for Clozapine: . The therapeutic response begins to appear at 100 mcg/L.  Refractory schizophrenia appears to require a therapeutic concentration of at least 350 mcg/L (trough, at steady state). . Toxic range: Greater than 1000 mcg/L . This test was developed and its analytical performance characteristics have been determined by [facility] [place], [place]. It has not been cleared or approved by the U.S. Food and Drug Administration. This assay has been validated pursuant to the CLIA regulations and is used for clinical purposes. Marland Kitchen.   CBC with Differential/Platelet     Status: None   Collection Time: 10/27/18  2:48 PM  Result Value Ref Range   WBC 8.0 3.8 - 10.8 Thousand/uL   RBC  4.38 3.80 - 5.10 Million/uL   Hemoglobin 13.7 11.7 - 15.5 g/dL   HCT 16.140.2 09.635.0 - 04.545.0 %   MCV 91.8 80.0 - 100.0 fL   MCH 31.3 27.0 - 33.0 pg   MCHC 34.1 32.0 - 36.0 g/dL   RDW 40.913.1 81.111.0 - 91.415.0 %   Platelets 263 140 - 400 Thousand/uL   MPV 10.4 7.5 - 12.5 fL    Neutro Abs 5,664 1,500 - 7,800 cells/uL   Lymphs Abs 1,608 850 - 3,900 cells/uL   Absolute Monocytes 520 200 - 950 cells/uL   Eosinophils Absolute 144 15 - 500 cells/uL   Basophils Absolute 64 0 - 200 cells/uL   Neutrophils Relative % 70.8 %   Total Lymphocyte 20.1 %   Monocytes Relative 6.5 %   Eosinophils Relative 1.8 %   Basophils Relative 0.8 %  Clozapine (clozaril)     Status: None   Collection Time: 11/13/18  8:39 AM  Result Value Ref Range   NorClozapine 142 25 - 400 mcg/L   Clozapine Lvl 221 mcg/L    Comment: . Reference Range for Clozapine: . The therapeutic response begins to appear at 100 mcg/L.  Refractory schizophrenia appears to require a therapeutic concentration of at least 350 mcg/L (trough, at steady state). . Toxic range: Greater than 1000 mcg/L . This test was developed and its analytical performance characteristics have been determined by [facility] [place], [place]. It has not been cleared or approved by the U.S. Food and Drug Administration. This assay has been validated pursuant to the CLIA regulations and is used for clinical purposes. Marland Kitchen.   CBC with Differential/Platelet     Status: None   Collection Time: 11/13/18  8:39 AM  Result Value Ref Range   WBC 8.8 3.8 - 10.8 Thousand/uL   RBC 4.39 3.80 - 5.10 Million/uL   Hemoglobin 13.8 11.7 - 15.5 g/dL   HCT 78.241.2 95.635.0 - 21.345.0 %   MCV 93.8 80.0 - 100.0 fL   MCH 31.4 27.0 - 33.0 pg   MCHC 33.5 32.0 - 36.0 g/dL   RDW 08.613.0 57.811.0 - 46.915.0 %   Platelets 362 140 - 400 Thousand/uL   MPV 10.0 7.5 - 12.5 fL   Neutro Abs 6,116 1,500 - 7,800 cells/uL   Lymphs Abs 1,989 850 - 3,900 cells/uL   Absolute Monocytes 458 200 - 950 cells/uL   Eosinophils Absolute 150 15 - 500 cells/uL   Basophils Absolute 88 0 - 200 cells/uL   Neutrophils Relative % 69.5 %   Total Lymphocyte 22.6 %   Monocytes Relative 5.2 %   Eosinophils Relative 1.7 %   Basophils Relative 1.0 %  Clozapine (clozaril)      Status: None   Collection Time: 11/27/18  9:52 AM  Result Value Ref Range   NorClozapine 190 25 - 400 mcg/L   Clozapine Lvl 285 mcg/L    Comment: . Reference Range for Clozapine: . The therapeutic response begins to appear at 100 mcg/L.  Refractory schizophrenia appears to require a therapeutic concentration of at least 350 mcg/L (trough, at steady state). . Toxic range: Greater than 1000 mcg/L . This test was developed and its analytical performance characteristics have been determined by [facility] [place], [place]. It has not been cleared or approved by the U.S. Food and Drug Administration. This assay has been validated pursuant to the CLIA regulations and is used for clinical purposes. Marland Kitchen.   CBC with Differential/Platelet     Status: None  Collection Time: 11/27/18  9:52 AM  Result Value Ref Range   WBC 8.1 3.8 - 10.8 Thousand/uL   RBC 4.58 3.80 - 5.10 Million/uL   Hemoglobin 14.2 11.7 - 15.5 g/dL   HCT 24.5 80.9 - 98.3 %   MCV 94.1 80.0 - 100.0 fL   MCH 31.0 27.0 - 33.0 pg   MCHC 32.9 32.0 - 36.0 g/dL   RDW 38.2 50.5 - 39.7 %   Platelets 253 140 - 400 Thousand/uL   MPV 10.5 7.5 - 12.5 fL   Neutro Abs 5,597 1,500 - 7,800 cells/uL   Lymphs Abs 1,798 850 - 3,900 cells/uL   Absolute Monocytes 446 200 - 950 cells/uL   Eosinophils Absolute 203 15 - 500 cells/uL   Basophils Absolute 57 0 - 200 cells/uL   Neutrophils Relative % 69.1 %   Total Lymphocyte 22.2 %   Monocytes Relative 5.5 %   Eosinophils Relative 2.5 %   Basophils Relative 0.7 %  Clozapine (clozaril)     Status: None   Collection Time: 12/09/18  8:55 AM  Result Value Ref Range   NorClozapine 261 25 - 400 mcg/L   Clozapine Lvl 426 mcg/L    Comment: . Reference Range for Clozapine: . The therapeutic response begins to appear at 100 mcg/L.  Refractory schizophrenia appears to require a therapeutic concentration of at least 350 mcg/L (trough, at steady state). . Toxic range:  Greater than 1000 mcg/L . This test was developed and its analytical performance characteristics have been determined by [facility] [place], [place]. It has not been cleared or approved by the U.S. Food and Drug Administration. This assay has been validated pursuant to the CLIA regulations and is used for clinical purposes. Marland Kitchen   CBC with Differential/Platelet     Status: None   Collection Time: 12/09/18  8:55 AM  Result Value Ref Range   WBC 9.8 3.8 - 10.8 Thousand/uL   RBC 4.32 3.80 - 5.10 Million/uL   Hemoglobin 13.5 11.7 - 15.5 g/dL   HCT 67.3 41.9 - 37.9 %   MCV 94.4 80.0 - 100.0 fL   MCH 31.3 27.0 - 33.0 pg   MCHC 33.1 32.0 - 36.0 g/dL   RDW 02.4 09.7 - 35.3 %   Platelets 252 140 - 400 Thousand/uL   MPV 10.8 7.5 - 12.5 fL   Neutro Abs 7,095 1,500 - 7,800 cells/uL   Lymphs Abs 1,970 850 - 3,900 cells/uL   Absolute Monocytes 519 200 - 950 cells/uL   Eosinophils Absolute 127 15 - 500 cells/uL   Basophils Absolute 88 0 - 200 cells/uL   Neutrophils Relative % 72.4 %   Total Lymphocyte 20.1 %   Monocytes Relative 5.3 %   Eosinophils Relative 1.3 %   Basophils Relative 0.9 %  Clozapine (clozaril)     Status: None   Collection Time: 12/25/18  2:06 PM  Result Value Ref Range   NorClozapine 195 25 - 400 mcg/L   Clozapine Lvl 351 mcg/L    Comment: . Reference Range for Clozapine: . The therapeutic response begins to appear at 100 mcg/L.  Refractory schizophrenia appears to require a therapeutic concentration of at least 350 mcg/L (trough, at steady state). . Toxic range: Greater than 1000 mcg/L . This test was developed and its analytical performance characteristics have been determined by [facility] [place], [place]. It has not been cleared or approved by the U.S. Food and Drug Administration. This assay has been validated pursuant to  the CLIA regulations and is used for clinical purposes. Marland Kitchen   CBC with  Differential/Platelet     Status: None   Collection Time: 12/25/18  2:06 PM  Result Value Ref Range   WBC 7.5 3.8 - 10.8 Thousand/uL   RBC 4.48 3.80 - 5.10 Million/uL   Hemoglobin 14.0 11.7 - 15.5 g/dL   HCT 40.9 35.0 - 45.0 %   MCV 91.3 80.0 - 100.0 fL   MCH 31.3 27.0 - 33.0 pg   MCHC 34.2 32.0 - 36.0 g/dL   RDW 12.8 11.0 - 15.0 %   Platelets 275 140 - 400 Thousand/uL   MPV 11.6 7.5 - 12.5 fL   Neutro Abs 4,905 1,500 - 7,800 cells/uL   Lymphs Abs 1,898 850 - 3,900 cells/uL   Absolute Monocytes 510 200 - 950 cells/uL   Eosinophils Absolute 120 15 - 500 cells/uL   Basophils Absolute 68 0 - 200 cells/uL   Neutrophils Relative % 65.4 %   Total Lymphocyte 25.3 %   Monocytes Relative 6.8 %   Eosinophils Relative 1.6 %   Basophils Relative 0.9 %      Psychiatric Specialty Exam: Physical Exam  ROS  There were no vitals taken for this visit.There is no height or weight on file to calculate BMI.  General Appearance: NA  Eye Contact:  NA  Speech:  fast and rambling  Volume:  Increased  Mood:  Depressed, Dysphoric and Irritable  Affect:  NA  Thought Process:  Descriptions of Associations: Circumstantial  Orientation:  Full (Time, Place, and Person)  Thought Content:  Hallucinations: Auditory people calling my name, Paranoid Ideation, Rumination and night mares and seeing shadows  Suicidal Thoughts:  passive suicidal thoughts but no plan  Homicidal Thoughts:  No  Memory:  Immediate;   Fair Recent;   Fair Remote;   Fair  Judgement:  Fair  Insight:  Fair  Psychomotor Activity:  NA  Concentration:  Concentration: Fair and Attention Span: Fair  Recall:  [employer] of Knowledge:  Fair  Language:  Fair  Akathisia:  No  Handed:  Right  AIMS (if indicated):     Assets:  Desire for Improvement Housing Social Support  ADL's:  Intact  Cognition:  Impaired,  Mild  Sleep:   poor   :   Assessment and Plan: Schizophrenia chronic paranoid type.  Posttraumatic stress disorder.   Anxiety.  I reviewed her blood work results including clozapine level and WBC count which is normal.  It is unclear what triggered these hallucination, paranoia and nightmares but husband believe due to recent cut down the job hours causing financial strain.  I recommend to try clozapine 300 mg a day since she is tolerating the medication except drooling.  I recommend to try higher dose at night however if drooling does get worse then she can take 100 mg 3 times a day.  I will defer adding any more medication as patient has a history of fall, dizziness with higher doses of medication.  Continue Remeron 45 mg at bedtime, Minipress 2 mg at bedtime, Klonopin 0.5 mg 3 times a day.  The new prescription of clozapine will be 50 mg in the morning, 100 mg in the afternoon and 150 mg at bedtime.  I recommend to keep the appointment in 6 weeks to 8 weeks.  Encouraged to continue therapy with Janett Billow.  Discussed safety concerns and anytime having active suicidal thoughts or homicidal thought then she need to call 911 or go to  local emergency room.  Plan discussed with the patient and her husband in detail.  Time spent 25 minutes.  Follow Up Instructions:    I discussed the assessment and treatment plan with the patient. The patient was provided an opportunity to ask questions and all were answered. The patient agreed with the plan and demonstrated an understanding of the instructions.   The patient was advised to call back or seek an in-person evaluation if the symptoms worsen or if the condition fails to improve as anticipated.  I provided 25 minutes of non-face-to-face time during this encounter.   Cleotis Nipper, MD

## 2019-01-08 ENCOUNTER — Other Ambulatory Visit (HOSPITAL_COMMUNITY): Payer: Self-pay | Admitting: Psychiatry

## 2019-01-11 LAB — CBC WITH DIFFERENTIAL/PLATELET
Absolute Monocytes: 454 cells/uL (ref 200–950)
Basophils Absolute: 71 cells/uL (ref 0–200)
Basophils Relative: 0.8 %
Eosinophils Absolute: 107 cells/uL (ref 15–500)
Eosinophils Relative: 1.2 %
HCT: 42.6 % (ref 35.0–45.0)
Hemoglobin: 14.2 g/dL (ref 11.7–15.5)
Lymphs Abs: 2136 cells/uL (ref 850–3900)
MCH: 31.1 pg (ref 27.0–33.0)
MCHC: 33.3 g/dL (ref 32.0–36.0)
MCV: 93.4 fL (ref 80.0–100.0)
MPV: 10.5 fL (ref 7.5–12.5)
Monocytes Relative: 5.1 %
Neutro Abs: 6132 cells/uL (ref 1500–7800)
Neutrophils Relative %: 68.9 %
Platelets: 250 10*3/uL (ref 140–400)
RBC: 4.56 10*6/uL (ref 3.80–5.10)
RDW: 13 % (ref 11.0–15.0)
Total Lymphocyte: 24 %
WBC: 8.9 10*3/uL (ref 3.8–10.8)

## 2019-01-11 LAB — CLOZAPINE (CLOZARIL)
Clozapine Lvl: 291 mcg/L
NorClozapine: 218 mcg/L (ref 25–400)

## 2019-01-12 ENCOUNTER — Ambulatory Visit (HOSPITAL_COMMUNITY): Payer: BC Managed Care – PPO | Admitting: Licensed Clinical Social Worker

## 2019-01-12 ENCOUNTER — Telehealth (HOSPITAL_COMMUNITY): Payer: Self-pay | Admitting: Licensed Clinical Social Worker

## 2019-01-12 ENCOUNTER — Other Ambulatory Visit: Payer: Self-pay

## 2019-01-12 NOTE — Telephone Encounter (Signed)
Clinician called and attempted to initiate scheduled visit. No answer. Clinician emailed link and left Web ex portal open for 20 minutes with no luck.  Visit considered no show.

## 2019-01-21 ENCOUNTER — Other Ambulatory Visit (HOSPITAL_COMMUNITY): Payer: Self-pay | Admitting: Psychiatry

## 2019-01-26 ENCOUNTER — Ambulatory Visit (INDEPENDENT_AMBULATORY_CARE_PROVIDER_SITE_OTHER): Payer: BC Managed Care – PPO | Admitting: Licensed Clinical Social Worker

## 2019-01-26 ENCOUNTER — Encounter (HOSPITAL_COMMUNITY): Payer: Self-pay | Admitting: Licensed Clinical Social Worker

## 2019-01-26 ENCOUNTER — Other Ambulatory Visit: Payer: Self-pay

## 2019-01-26 DIAGNOSIS — F4310 Post-traumatic stress disorder, unspecified: Secondary | ICD-10-CM

## 2019-01-26 DIAGNOSIS — F200 Paranoid schizophrenia: Secondary | ICD-10-CM

## 2019-01-26 LAB — CBC WITH DIFFERENTIAL/PLATELET
Absolute Monocytes: 460 cells/uL (ref 200–950)
Basophils Absolute: 70 cells/uL (ref 0–200)
Basophils Relative: 0.9 %
Eosinophils Absolute: 187 cells/uL (ref 15–500)
Eosinophils Relative: 2.4 %
HCT: 41.5 % (ref 35.0–45.0)
Hemoglobin: 14.1 g/dL (ref 11.7–15.5)
Lymphs Abs: 2153 cells/uL (ref 850–3900)
MCH: 31.9 pg (ref 27.0–33.0)
MCHC: 34 g/dL (ref 32.0–36.0)
MCV: 93.9 fL (ref 80.0–100.0)
MPV: 10.6 fL (ref 7.5–12.5)
Monocytes Relative: 5.9 %
Neutro Abs: 4930 cells/uL (ref 1500–7800)
Neutrophils Relative %: 63.2 %
Platelets: 253 10*3/uL (ref 140–400)
RBC: 4.42 10*6/uL (ref 3.80–5.10)
RDW: 12.9 % (ref 11.0–15.0)
Total Lymphocyte: 27.6 %
WBC: 7.8 10*3/uL (ref 3.8–10.8)

## 2019-01-26 LAB — CLOZAPINE (CLOZARIL)
Clozapine Lvl: 361 mcg/L
NorClozapine: 228 mcg/L (ref 25–400)

## 2019-01-26 NOTE — Progress Notes (Signed)
Virtual Visit via Video Note  I connected with Danielle [name] on 01/26/19 at  8:00 AM EDT by a video enabled telemedicine application and verified that I am speaking with the correct person using two identifiers.     I discussed the limitations of evaluation and management by telemedicine and the availability of in person appointments. The patient expressed understanding and agreed to proceed.  Type of Therapy: Individual Therapy  Treatment Goals addressed: Improve Psychiatric Symptoms, Emotional Regulation Skills, Calming Skills, Healthy Coping Skills, discuss and process traumatic event, reduced irrational worries and fears  Interventions: Motivational Interviewing, Cognitive Behavior Therapy and Grounding/Mindfulness Skills, psychoeducation  Summary: Danielle [name] is a 48 y.o. female who presents with Schizophrenia and PTSD.  Suicidal/Homicidal: No - without intent/plan  Therapist Response:  Danielle [name] met with clinician for an individual session. Danielle [name]'s husband joined the session to support her. Danielle [name] discussed her psychiatric symptoms, her current life events. Danielle [name] shared that she has been doing "so-so". She reports she has a hard week coming up the first week of October, due to it being her son's birthday and the anniversary of her father's death. Clinician processed thoughts and feelings about those days, noting the bittersweet sentiments about those days. Clinician explored options for making those days happier, by doing things those people loved to do, eating their favorite foods, or by telling stories about them. Danielle [name] spent some time reminiscing with clinician about her father, things he used to say or things they did together. Clinician reflected the importance of maintaining those good memories and retelling the stories in order to keep the memories alive. Danielle [name] also told stories about her best friend, who died of cancer several years ago. She was reminded of the last time she visited  her before she died and identified things that they used to do together, places they'd been, and their adventures. Clinician validated the memories and engaged in reflections about how life has changed.   Plan: Return again in 2 weeks.  Diagnosis: Axis I: Schizophrenia, paranoid type and PTSD    I discussed the assessment and treatment plan with the patient. The patient was provided an opportunity to ask questions and all were answered. The patient agreed with the plan and demonstrated an understanding of the instructions.   The patient was advised to call back or seek an in-person evaluation if the symptoms worsen or if the condition fails to improve as anticipated.  I provided 45 minutes of non-face-to-face time during this encounter.   [name], LCSW

## 2019-02-03 IMAGING — CR DG CHEST 2V
2 series · 2 of 2 positions shown · non-contrast
Comparison: 12/23/2015 chest radiographs and earlier.

CLINICAL DATA: 47-year-old female with wheezing.  Ankle injury.

EXAM:
CHEST - 2 VIEW

[w chest pa]
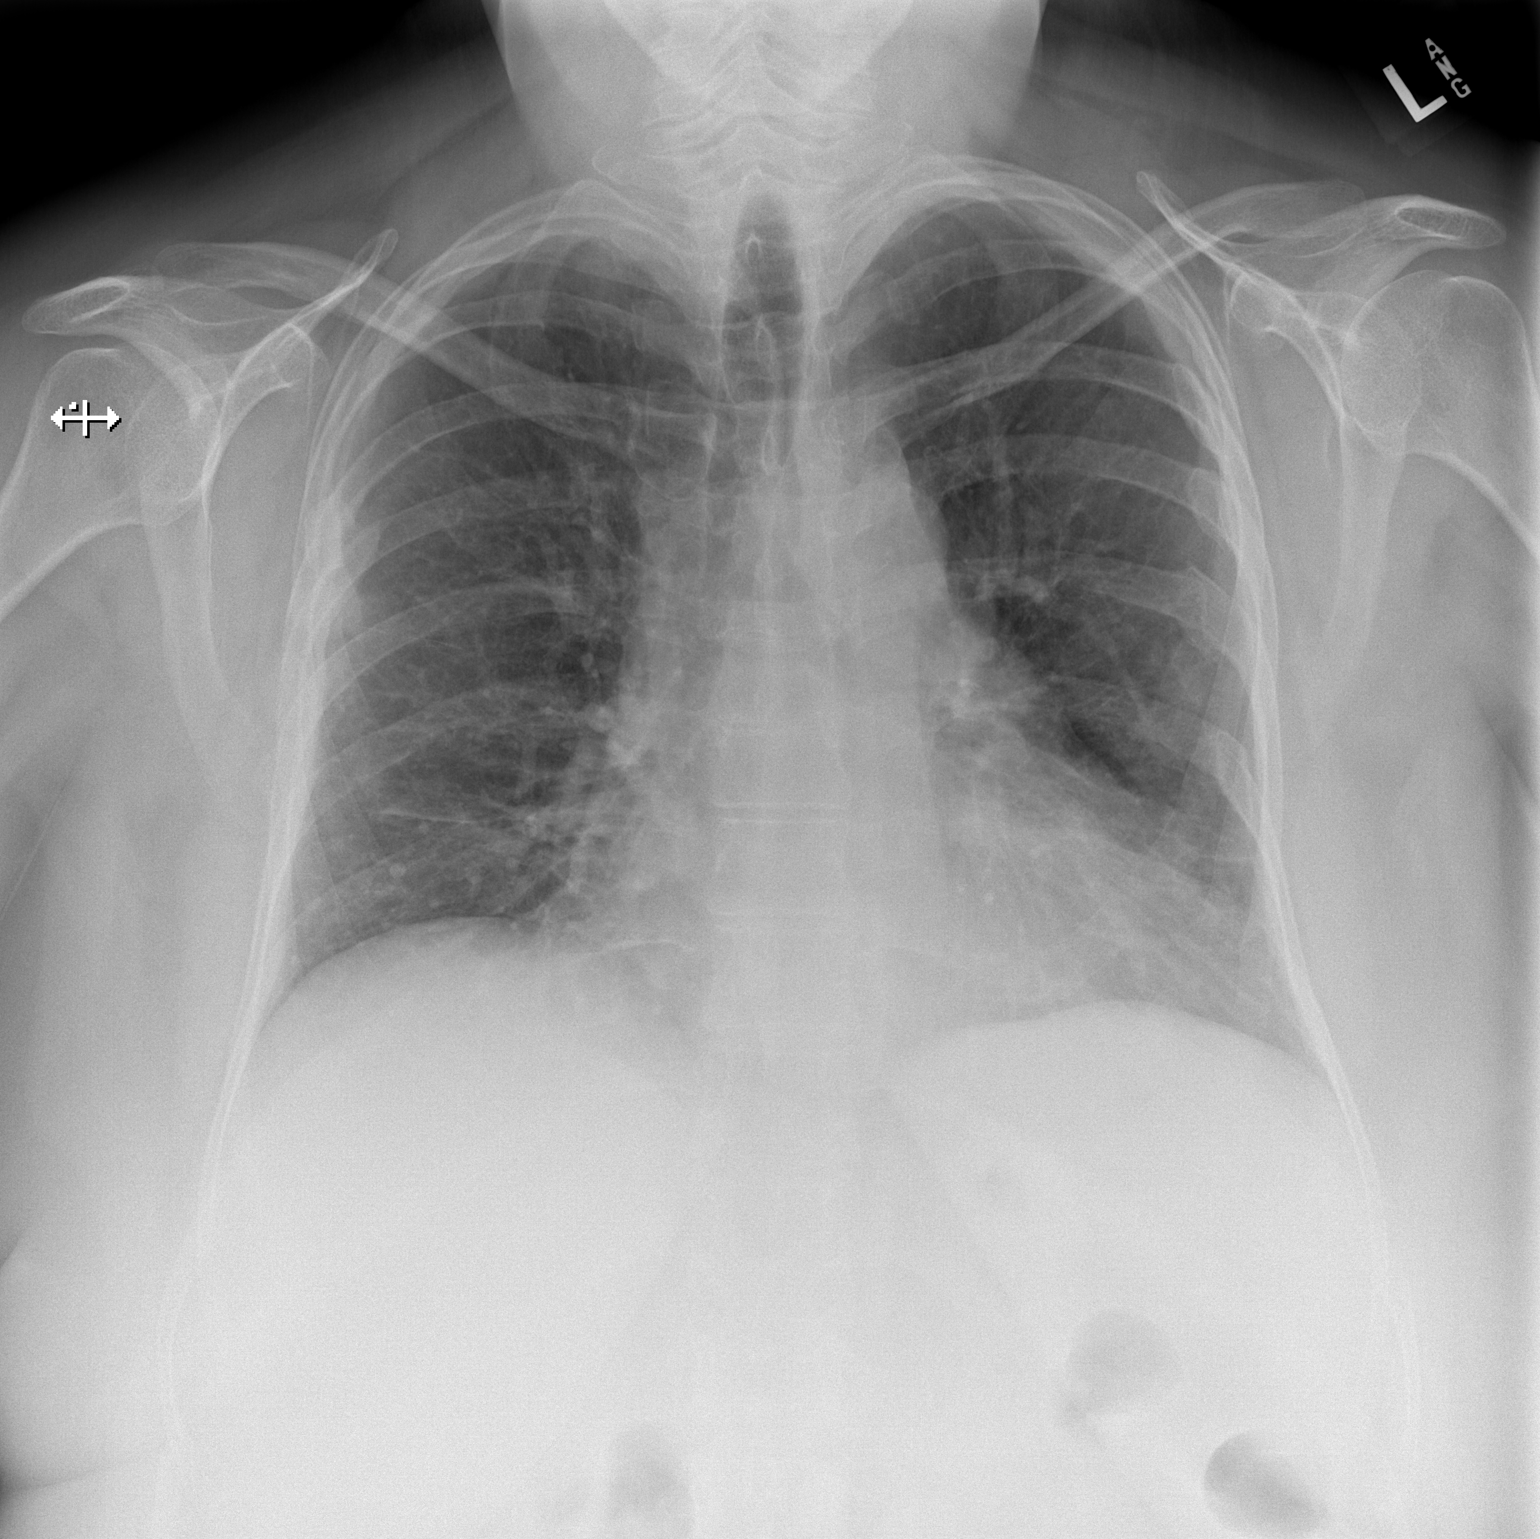

[w chest lat]
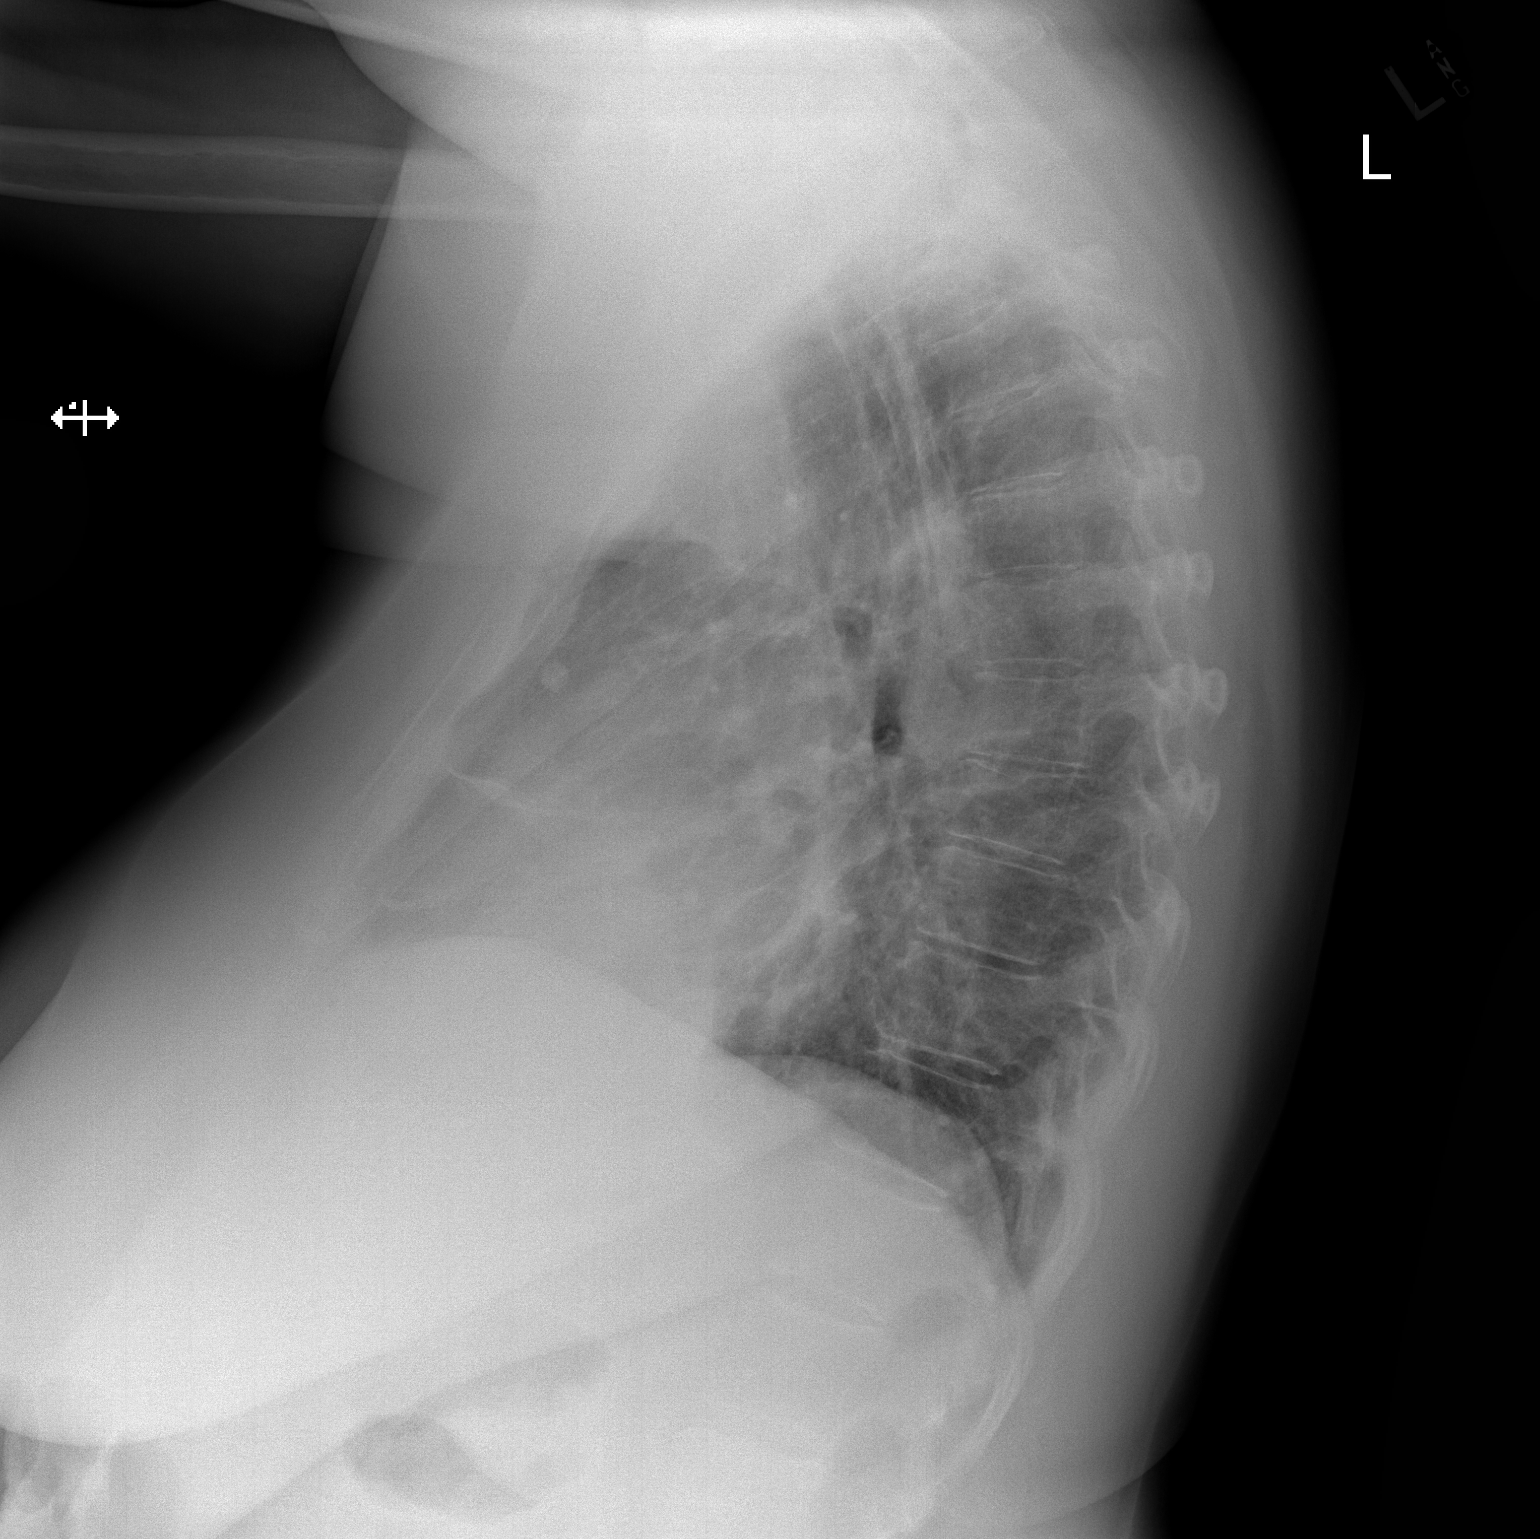

[2 of 2 positions shown; findings below may reference images not displayed]

FINDINGS: Chronic bilateral lateral rib fractures. Cardiac size is stable at
the upper limits of normal. Other mediastinal contours are within
normal limits. Visualized tracheal air column is within normal
limits. Chronically somewhat low lung volumes with no pneumothorax,
pulmonary edema, pleural effusion or consolidation. Middle lobe
curvilinear opacity is chronic and could be atelectasis or scarring.
No acute osseous abnormality identified. Negative visible bowel gas
pattern.
IMPRESSION: Chronically low lung volumes with middle lobe atelectasis or
scarring suspected. No acute cardiopulmonary abnormality.

## 2019-02-03 IMAGING — CR DG ANKLE COMPLETE 3+V*R*
3 series · 3 of 3 positions shown · non-contrast
Comparison: None.

CLINICAL DATA: 47-year-old female with twisting injury to the right
ankle this morning. Prior ankle surgery.

EXAM:
RIGHT ANKLE - COMPLETE 3+ VIEW

[x ankle lat right]
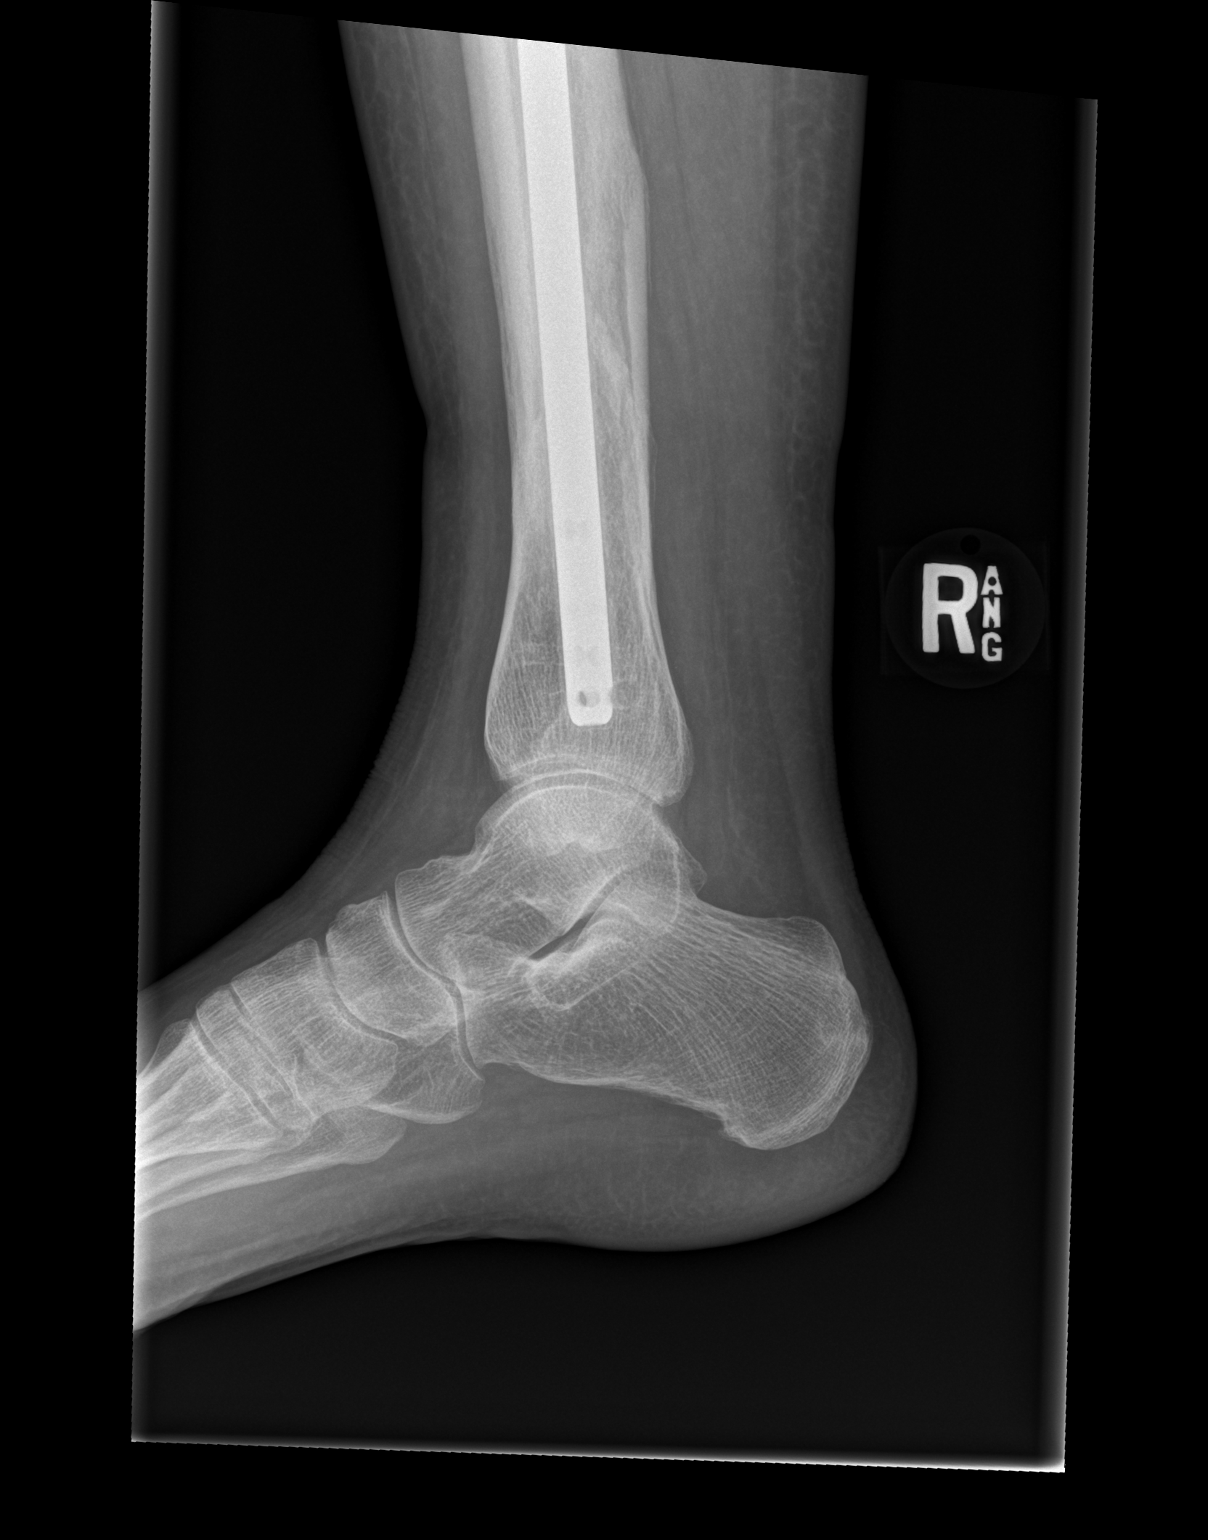

[x ankle ap right]
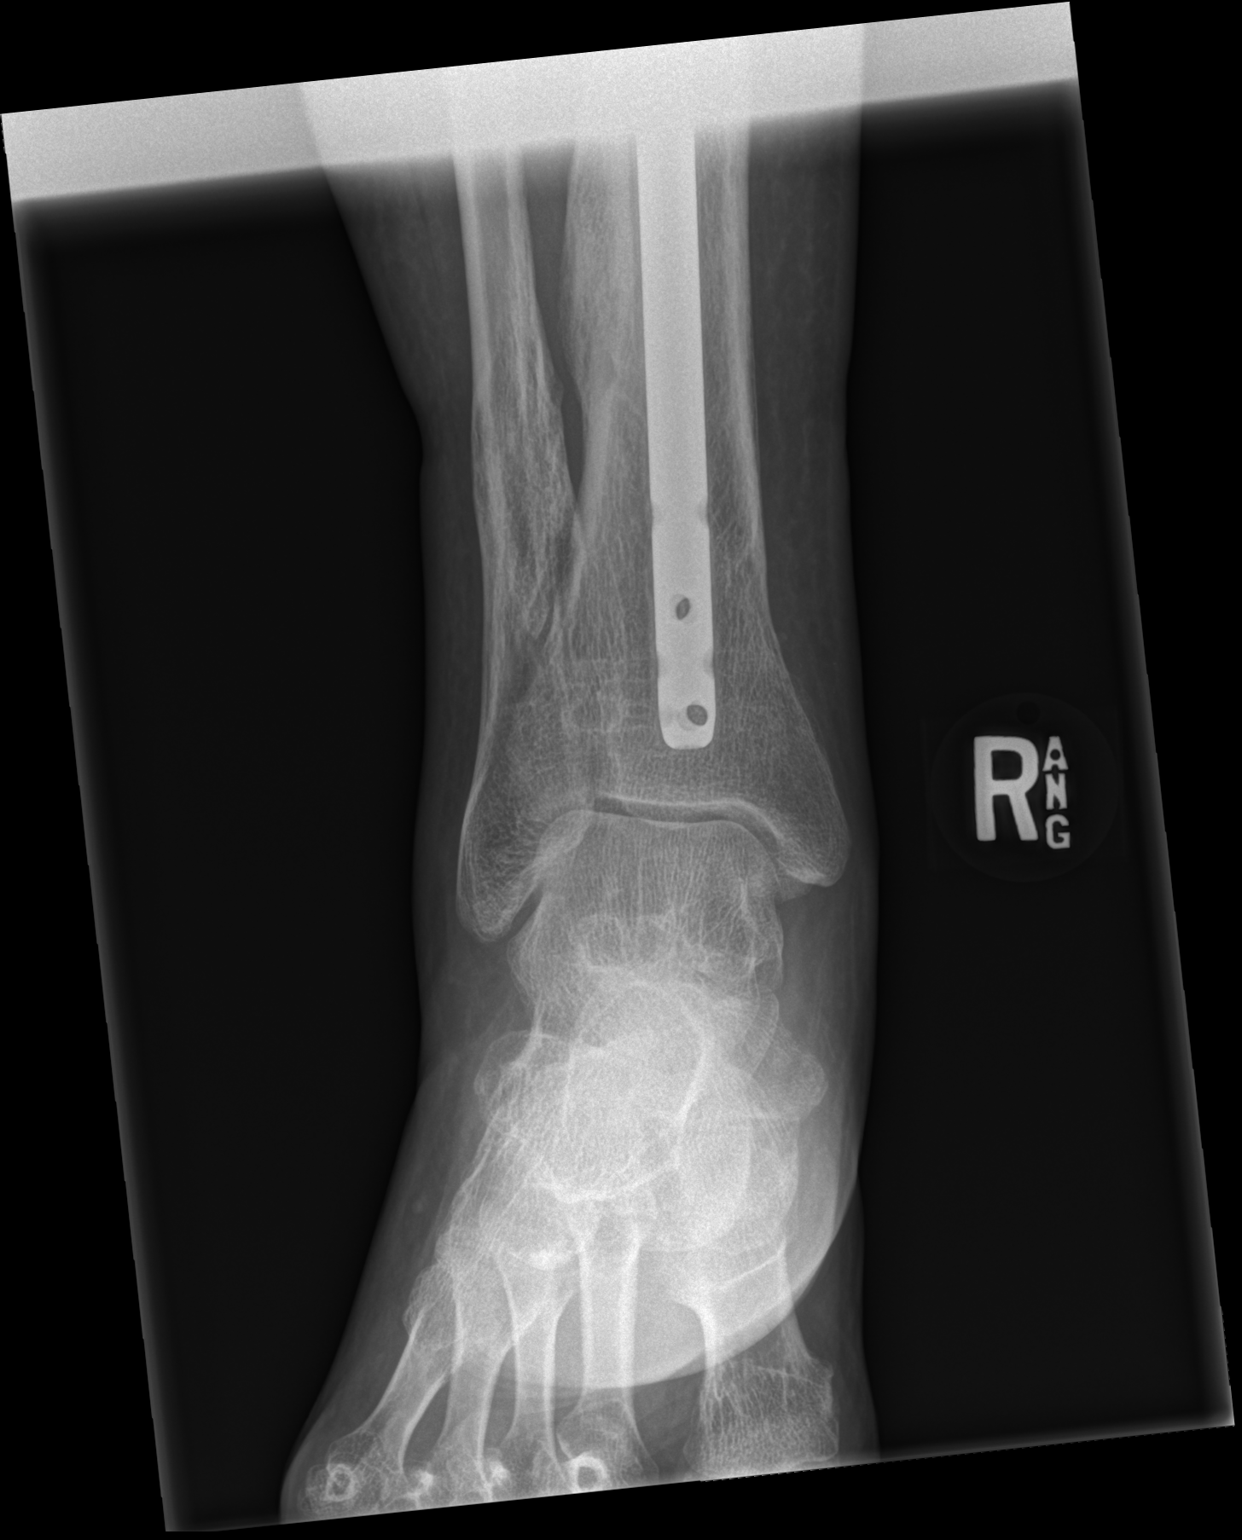

[x ankle obl right]
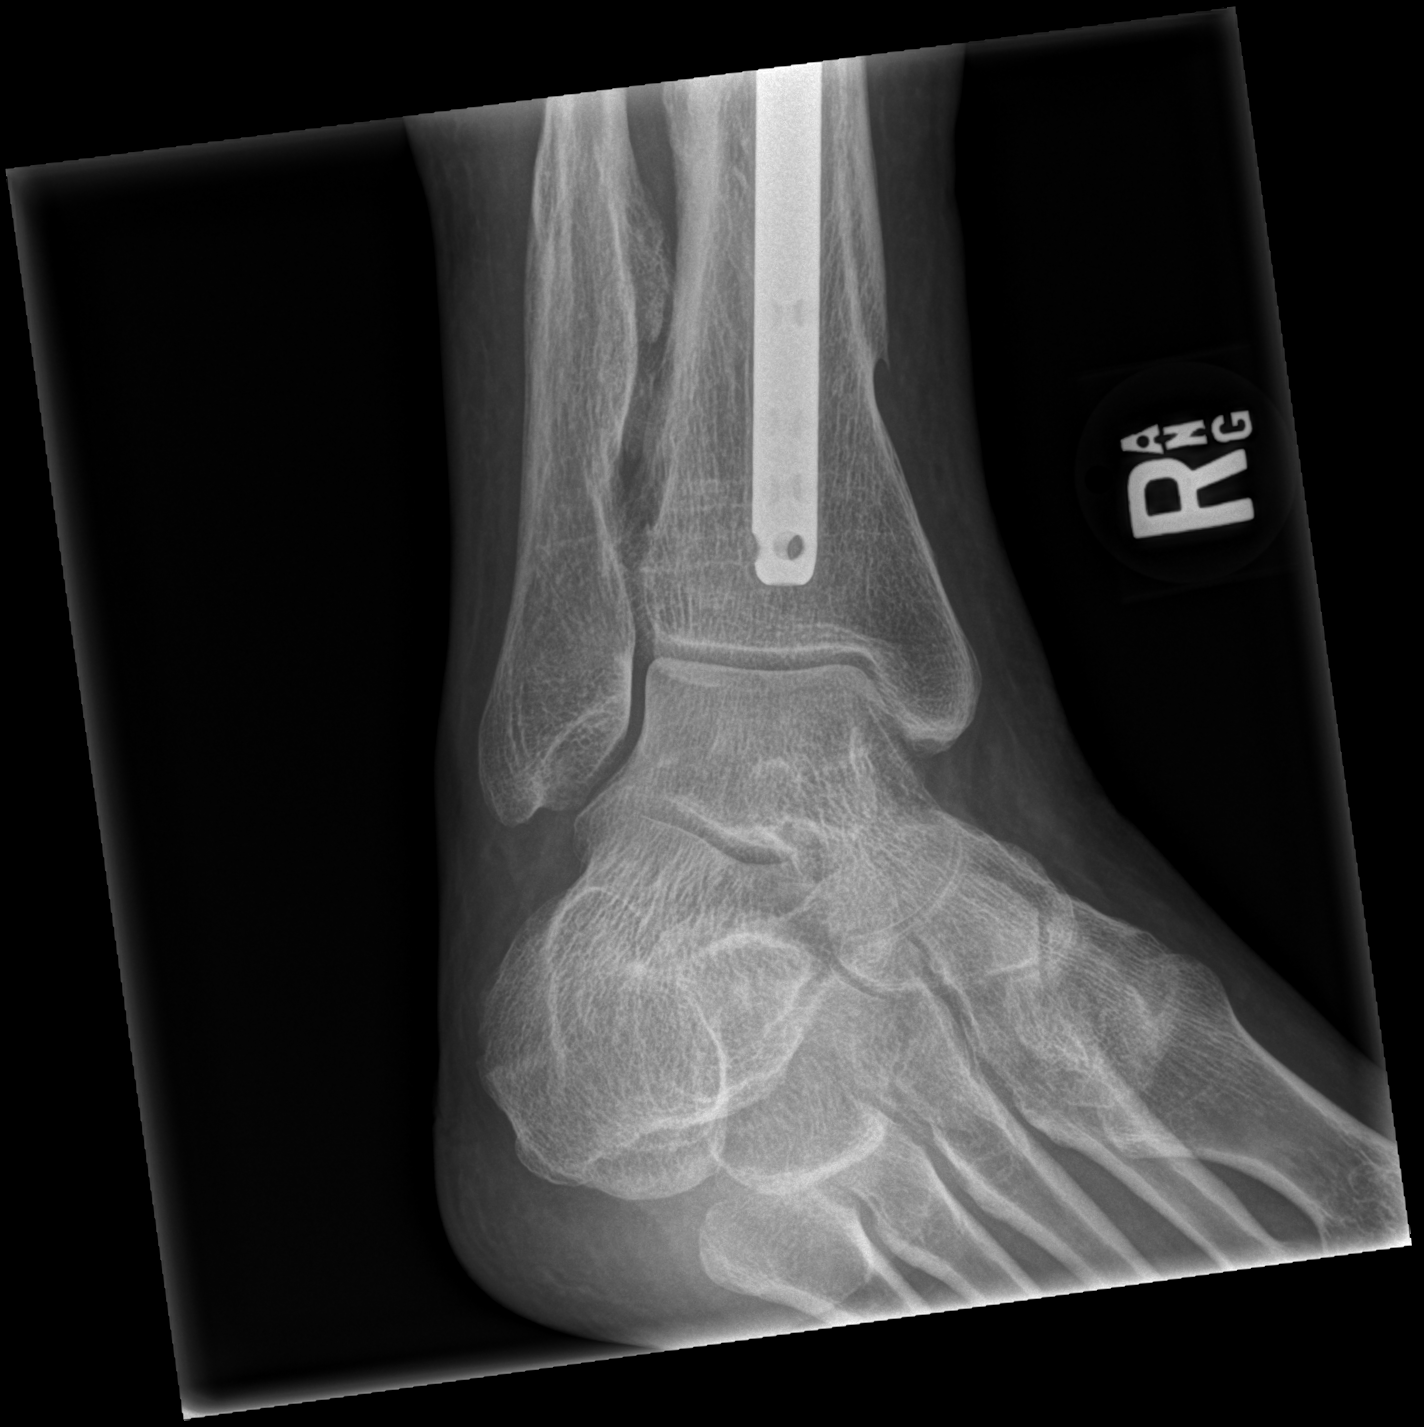

[3 of 3 positions shown; findings below may reference images not displayed]

FINDINGS: Tibia intramedullary rod is in place. There is no distal
interlocking hardware present at this time. There ear scratched at
there are healed distal tibia and fibula metadiaphysis fractures
proximal to the ankle joint. Mortise joint alignment is maintained.
Talar dome intact. No evidence of joint effusion. Medial and lateral
malleolus appear intact. Calcaneus and visible bones of the right
foot appear intact.
IMPRESSION: 1. No acute fracture or dislocation identified about the right
ankle.
2. Healed distal tibia and fibula metadiaphysis fractures with
partially visible tibia intramedullary rod.

## 2019-02-09 ENCOUNTER — Encounter (HOSPITAL_COMMUNITY): Payer: Self-pay | Admitting: Licensed Clinical Social Worker

## 2019-02-09 ENCOUNTER — Ambulatory Visit (INDEPENDENT_AMBULATORY_CARE_PROVIDER_SITE_OTHER): Payer: BC Managed Care – PPO | Admitting: Licensed Clinical Social Worker

## 2019-02-09 ENCOUNTER — Other Ambulatory Visit: Payer: Self-pay

## 2019-02-09 DIAGNOSIS — F200 Paranoid schizophrenia: Secondary | ICD-10-CM

## 2019-02-09 DIAGNOSIS — F4310 Post-traumatic stress disorder, unspecified: Secondary | ICD-10-CM | POA: Diagnosis not present

## 2019-02-09 NOTE — Progress Notes (Signed)
Virtual Visit via Video Note  I connected with Danielle [name] on 02/09/19 at  8:00 AM EDT by a video enabled telemedicine application and verified that I am speaking with the correct person using two identifiers.     I discussed the limitations of evaluation and management by telemedicine and the availability of in person appointments. The patient expressed understanding and agreed to proceed.   Type of Therapy: Individual Therapy  Treatment Goals addressed: Improve Psychiatric Symptoms, Emotional Regulation Skills, Calming Skills, Healthy Coping Skills, discuss and process traumatic event, reduced irrational worries and fears  Interventions: Motivational Interviewing, Cognitive Behavior Therapy and Grounding/Mindfulness Skills, psychoeducation  Summary: Danielle [name] is a 48 y.o. female who presents with Schizophrenia and PTSD.  Suicidal/Homicidal: No - without intent/plan  Therapist Response:  Danielle [name] met with clinician for an individual session.  Danielle [name] discussed her psychiatric symptoms, her current life events. Danielle [name] shared that she has been doing "so-so". She reports she had a tough day yesterday due to it being the anniversary of her friend's death and her son's birthday. She also reported sadness that she has not been in contact with any of her children for several weeks. Clinician processed thoughts and feelings. Clinician also explored ways that Danielle [name] has reached out to them to be supportive and loving.  Danielle [name] reports ongoing concerns about her finances, noting that her husband has been trying to get some extra shifts, but they are not consistent as they were before. Clinician encouraged Danielle [name] to look at the application for disability, as she still has not completed this work for the possibility of having more steady income.  Danielle [name] appeared low, but stable. She did not identify any suicidal thoughts or instrusive voices that were really bothering her. However, she continues to  chain smoke and spends a lot of money monthly on cigarettes. Danielle [name] denies interest in quitting at this time.   Plan: Return again in 2 weeks.  Diagnosis: Axis I: Schizophrenia, paranoid type and PTSD    I discussed the assessment and treatment plan with the patient. The patient was provided an opportunity to ask questions and all were answered. The patient agreed with the plan and demonstrated an understanding of the instructions.   The patient was advised to call back or seek an in-person evaluation if the symptoms worsen or if the condition fails to improve as anticipated.  I provided 45 minutes of non-face-to-face time during this encounter.   [name], LCSW

## 2019-02-10 ENCOUNTER — Other Ambulatory Visit (HOSPITAL_COMMUNITY): Payer: Self-pay | Admitting: Psychiatry

## 2019-02-14 LAB — CBC WITH DIFFERENTIAL/PLATELET
Absolute Monocytes: 616 cells/uL (ref 200–950)
Basophils Absolute: 61 cells/uL (ref 0–200)
Basophils Relative: 0.6 %
Eosinophils Absolute: 152 cells/uL (ref 15–500)
Eosinophils Relative: 1.5 %
HCT: 41.2 % (ref 35.0–45.0)
Hemoglobin: 13.9 g/dL (ref 11.7–15.5)
Lymphs Abs: 2586 cells/uL (ref 850–3900)
MCH: 32 pg (ref 27.0–33.0)
MCHC: 33.7 g/dL (ref 32.0–36.0)
MCV: 94.9 fL (ref 80.0–100.0)
MPV: 11 fL (ref 7.5–12.5)
Monocytes Relative: 6.1 %
Neutro Abs: 6686 cells/uL (ref 1500–7800)
Neutrophils Relative %: 66.2 %
Platelets: 253 10*3/uL (ref 140–400)
RBC: 4.34 10*6/uL (ref 3.80–5.10)
RDW: 13.1 % (ref 11.0–15.0)
Total Lymphocyte: 25.6 %
WBC: 10.1 10*3/uL (ref 3.8–10.8)

## 2019-02-14 LAB — CLOZAPINE (CLOZARIL)
Clozapine Lvl: 315 mcg/L
NorClozapine: 266 mcg/L (ref 25–400)

## 2019-02-23 ENCOUNTER — Encounter (HOSPITAL_COMMUNITY): Payer: Self-pay | Admitting: Licensed Clinical Social Worker

## 2019-02-23 ENCOUNTER — Ambulatory Visit (INDEPENDENT_AMBULATORY_CARE_PROVIDER_SITE_OTHER): Payer: BC Managed Care – PPO | Admitting: Licensed Clinical Social Worker

## 2019-02-23 ENCOUNTER — Other Ambulatory Visit: Payer: Self-pay

## 2019-02-23 DIAGNOSIS — F4310 Post-traumatic stress disorder, unspecified: Secondary | ICD-10-CM | POA: Diagnosis not present

## 2019-02-23 DIAGNOSIS — F200 Paranoid schizophrenia: Secondary | ICD-10-CM

## 2019-02-23 NOTE — Progress Notes (Signed)
Virtual Visit via Video Note  I connected with Man[place]quan on 02/23/19 at  8:00 AM EDT by a video enabled telemedicine application and verified that I am speaking with the correct person using two identifiers.     I discussed the limitations of evaluation and management by telemedicine and the availability of in person appointments. The patient expressed understanding and agreed to proceed.  Type of Therapy: Individual Therapy  Treatment Goals addressed: Improve Psychiatric Symptoms, Emotional Regulation Skills, Calming Skills, Healthy Coping Skills, discuss and process traumatic event, reduced irrational worries and fears  Interventions: Motivational Interviewing, Cognitive Behavior Therapy and Grounding/Mindfulness Skills, psychoeducation  Summary: TASHONA CALK is a 48 y.o. female who presents with Schizophrenia and PTSD.  Suicidal/Homicidal: No - without intent/plan  Therapist Response:  Derica met with clinician for an individual session.  Ani discussed her psychiatric symptoms, her current life events. Doyce shared thatshe has had a rough week. She reports her mother has been in and out of the hospital over the past few weeks due to breathing problems and they found that "half of her heart was dead". Adalyna reports that her mother may have 4-5 months to live. Clinician processed thoughts and feelings using MI. Clinician normalized sadness, fear, pain, and worry. Dakiya reports her husband has been extremely supportive and he will drive her to [facility] on Thursday of this week to see her mother. Clinician discussed Xitlali's concerns about her own mental health going down to see mother. Faria reported fear that she will have a panic attack or pass out when she is there. Clinician processed thoughts and feelings and reviewed coping skills. Clinician walked Ericka through a plan if she starts to feel anxious, to go outside, drink cold water, and focus on her breathing before the panic attack  sets in. Clinician also noted the positive plan to take their camper to [facility] and not stay with mom, so Purity can have a safe place to compose herself and to manage her own emotions. Clinician noted a great deal of loneliness. Clinician provided information about getting involved with Hospice, as they have support groups that may be helpful for Farren.   Plan: Return again in 2 weeks.  Diagnosis: Axis I: Schizophrenia, paranoid type and PTSD     I discussed the assessment and treatment plan with the patient. The patient was provided an opportunity to ask questions and all were answered. The patient agreed with the plan and demonstrated an understanding of the instructions.   The patient was advised to call back or seek an in-person evaluation if the symptoms worsen or if the condition fails to improve as anticipated.  I provided 45 minutes of non-face-to-face time during this encounter.   [name], LCSW

## 2019-02-24 ENCOUNTER — Other Ambulatory Visit (HOSPITAL_COMMUNITY): Payer: Self-pay | Admitting: Psychiatry

## 2019-02-27 LAB — CBC WITH DIFFERENTIAL/PLATELET
Absolute Monocytes: 556 cells/uL (ref 200–950)
Basophils Absolute: 52 cells/uL (ref 0–200)
Basophils Relative: 0.5 %
Eosinophils Absolute: 155 cells/uL (ref 15–500)
Eosinophils Relative: 1.5 %
HCT: 42.6 % (ref 35.0–45.0)
Hemoglobin: 14.4 g/dL (ref 11.7–15.5)
Lymphs Abs: 2009 cells/uL (ref 850–3900)
MCH: 31.9 pg (ref 27.0–33.0)
MCHC: 33.8 g/dL (ref 32.0–36.0)
MCV: 94.5 fL (ref 80.0–100.0)
MPV: 11 fL (ref 7.5–12.5)
Monocytes Relative: 5.4 %
Neutro Abs: 7529 cells/uL (ref 1500–7800)
Neutrophils Relative %: 73.1 %
Platelets: 299 10*3/uL (ref 140–400)
RBC: 4.51 10*6/uL (ref 3.80–5.10)
RDW: 13 % (ref 11.0–15.0)
Total Lymphocyte: 19.5 %
WBC: 10.3 10*3/uL (ref 3.8–10.8)

## 2019-02-27 LAB — CLOZAPINE (CLOZARIL)
Clozapine Lvl: 368 mcg/L
NorClozapine: 266 mcg/L (ref 25–400)

## 2019-03-09 ENCOUNTER — Ambulatory Visit (INDEPENDENT_AMBULATORY_CARE_PROVIDER_SITE_OTHER): Payer: BC Managed Care – PPO | Admitting: Licensed Clinical Social Worker

## 2019-03-09 ENCOUNTER — Other Ambulatory Visit: Payer: Self-pay

## 2019-03-09 ENCOUNTER — Encounter (HOSPITAL_COMMUNITY): Payer: Self-pay | Admitting: Licensed Clinical Social Worker

## 2019-03-09 DIAGNOSIS — F200 Paranoid schizophrenia: Secondary | ICD-10-CM | POA: Diagnosis not present

## 2019-03-09 DIAGNOSIS — F4310 Post-traumatic stress disorder, unspecified: Secondary | ICD-10-CM | POA: Diagnosis not present

## 2019-03-09 NOTE — Progress Notes (Signed)
Virtual Visit via Video Note  I connected with [place] on 03/09/19 at  9:00 AM EST by a video enabled telemedicine application and verified that I am speaking with the correct person using two identifiers.     I discussed the limitations of evaluation and management by telemedicine and the availability of in person appointments. The patient expressed understanding and agreed to proceed.  Type of Therapy: Individual Therapy  Treatment Goals addressed: Improve Psychiatric Symptoms, Emotional Regulation Skills, Calming Skills, Healthy Coping Skills, discuss and process traumatic event, reduced irrational worries and fears  Interventions: Motivational Interviewing, Cognitive Behavior Therapy and Grounding/Mindfulness Skills, psychoeducation  Summary: Danielle [name] is a48y.o. female who presents with Schizophrenia and PTSD.  Suicidal/Homicidal: No - without intent/plan  Therapist Response:  Danielle [name] met with clinician for an individual session. Danielle [name] discussed her psychiatric symptoms, her current life events. Danielle [name] shared thatshe has been doing okay this week. She reported that she went to [facility] to spend a few days with her mother, who seems to be doing a little better, despite the concern that she may only have a few months to live. Clinician processed thoughts and feelings about mom's health and fears of losing her. Clinician also explored the logistical plans for when mom passes to ensure that any work that needs to be done can get done now and will not need to be done at the time.  Danielle [name] reports she and her brother are getting along pretty well, working together to help mom. Danielle [name] reported some frustration with her children for not being emotionally available to her in this time of need.  Clinician explored sxs of panic, anxiety, hallucinations, and medication side effects. Danielle [name] reports she has been doing pretty well overall, still having panic attacks and separation anxiety when  husband leaves for work. She also reports that she has to sleep with a towel on her pillow due to drooling from Clozaril.   Plan: Return again in 2 weeks.  Diagnosis: Axis I: Schizophrenia, paranoid type and PTSD    I discussed the assessment and treatment plan with the patient. The patient was provided an opportunity to ask questions and all were answered. The patient agreed with the plan and demonstrated an understanding of the instructions.   The patient was advised to call back or seek an in-person evaluation if the symptoms worsen or if the condition fails to improve as anticipated.  I provided 45 minutes of non-face-to-face time during this encounter.   [name], LCSW

## 2019-03-10 ENCOUNTER — Other Ambulatory Visit: Payer: Self-pay

## 2019-03-10 ENCOUNTER — Encounter (HOSPITAL_COMMUNITY): Payer: Self-pay | Admitting: Psychiatry

## 2019-03-10 ENCOUNTER — Ambulatory Visit (INDEPENDENT_AMBULATORY_CARE_PROVIDER_SITE_OTHER): Payer: BC Managed Care – PPO | Admitting: Psychiatry

## 2019-03-10 DIAGNOSIS — F200 Paranoid schizophrenia: Secondary | ICD-10-CM

## 2019-03-10 DIAGNOSIS — F4310 Post-traumatic stress disorder, unspecified: Secondary | ICD-10-CM

## 2019-03-10 MED ORDER — MIRTAZAPINE 45 MG PO TABS: 45.0000 mg | ORAL_TABLET | Freq: Every day | ORAL | 0 refills | Status: DC

## 2019-03-10 MED ORDER — CLONAZEPAM 0.5 MG PO TABS: 0.5000 mg | ORAL_TABLET | Freq: Three times a day (TID) | ORAL | 2 refills | Status: DC | PRN

## 2019-03-10 MED ORDER — CLOZAPINE 50 MG PO TABS: ORAL_TABLET | ORAL | 2 refills | Status: DC

## 2019-03-10 MED ORDER — PRAZOSIN HCL 2 MG PO CAPS: 2.0000 mg | ORAL_CAPSULE | Freq: Every day | ORAL | 0 refills | Status: DC

## 2019-03-10 NOTE — Progress Notes (Signed)
Virtual Visit via Telephone Note  I connected with Danielle [name] on 03/10/19 at  9:00 AM EST by telephone and verified that I am speaking with the correct person using two identifiers.   I discussed the limitations, risks, security and privacy concerns of performing an evaluation and management service by telephone and the availability of in person appointments. I also discussed with the patient that there may be a patient responsible charge related to this service. The patient expressed understanding and agreed to proceed.   History of Present Illness: Patient was evaluated by phone session.  On Danielle last visit we increase the clozapine because she was having hallucination and having passive and fleeting suicidal thoughts.  She was doing well but recently she find out that Danielle [name] who lives in [place] having a lot of heart issues and doctor has given only 4 to 6 months to live.  She is devastated with the news.  She went to see Danielle and now she is again planning to make a visit in Thanksgiving.  Patient told that she is having crying spells and she is feeling very sad.  Danielle [name] who is in [place] now staying with the mom in [place].  She endorsed chronic hallucination, paranoia and ruminative thoughts but she has normal suicidal thoughts.  She is tolerating clozapine well other than drooling in the night.  She has no more dizziness or fall.  Danielle husband Delton Seeelson is very supportive.  Patient is pleased that recently his job hours are increased and today he was called in to work.  Patient was very worried about his job hours.  She is seeing Shanda BumpsJessica but sometimes she feels therapy is not working.  She feels very nervous and anxious about Danielle [name].  She is getting blood work for clozapine and so far Danielle blood work is a stable.  Danielle clozapine level is 368.  Danielle WBC count is 10.3.  Usually Danielle husband is available but today he is working and not available to talk.  Patient denies drinking or  using any illegal substances.  Danielle appetite is okay.  She denies any tremors, shakes or any EPS.   Past Psychiatric History:Reviewed. H/Omultiple hospitalizationdue tomultiple suicidal attempt, psychosis, paranoia, hallucination and severe depression. H/Odrowning herself.Last admission inOctober 2019 at [facility].Tred multiple medications includesZoloft, amitriptyline, Wellbutrin, Latuda, Xanax, Valium, Risperdal, lithium, Haldol, Depakote, Zyprexa, Geodon, imipramine,InVega, temazepamand Paxil, lithium, loxapineand lamictal.Referred AACTteambutnot approved.    Recent Results (from the past 2160 hour(s))  Clozapine (clozaril)     Status: None   Collection Time: 12/25/18  2:06 PM  Result Value Ref Range   NorClozapine 195 25 - 400 mcg/L   Clozapine Lvl 351 mcg/L    Comment: . Reference Range for Clozapine: . The therapeutic response begins to appear at 100 mcg/L.  Refractory schizophrenia appears to require a therapeutic concentration of at least 350 mcg/L (trough, at steady state). . Toxic range: Greater than 1000 mcg/L . This test was developed and its analytical performance characteristics have been determined by [facility] [place], [place]. It has not been cleared or approved by the U.S. Food and Drug Administration. This assay has been validated pursuant to the CLIA regulations and is used for clinical purposes. Marland Kitchen.   CBC with Differential/Platelet     Status: None   Collection Time: 12/25/18  2:06 PM  Result Value Ref Range   WBC 7.5 3.8 - 10.8 Thousand/uL   RBC 4.48 3.80 - 5.10 Million/uL  Hemoglobin 14.0 11.7 - 15.5 g/dL   HCT 16.1 09.6 - 04.5 %   MCV 91.3 80.0 - 100.0 fL   MCH 31.3 27.0 - 33.0 pg   MCHC 34.2 32.0 - 36.0 g/dL   RDW 40.9 81.1 - 91.4 %   Platelets 275 140 - 400 Thousand/uL   MPV 11.6 7.5 - 12.5 fL   Neutro Abs 4,905 1,500 - 7,800 cells/uL   Lymphs Abs 1,898 850 - 3,900 cells/uL   Absolute  Monocytes 510 200 - 950 cells/uL   Eosinophils Absolute 120 15 - 500 cells/uL   Basophils Absolute 68 0 - 200 cells/uL   Neutrophils Relative % 65.4 %   Total Lymphocyte 25.3 %   Monocytes Relative 6.8 %   Eosinophils Relative 1.6 %   Basophils Relative 0.9 %  Clozapine (clozaril)     Status: None   Collection Time: 01/08/19  9:34 AM  Result Value Ref Range   NorClozapine 218 25 - 400 mcg/L   Clozapine Lvl 291 mcg/L    Comment: . Reference Range for Clozapine: . The therapeutic response begins to appear at 100 mcg/L.  Refractory schizophrenia appears to require a therapeutic concentration of at least 350 mcg/L (trough, at steady state). . Toxic range: Greater than 1000 mcg/L . This test was developed and its analytical performance characteristics have been determined by [facility] [place], [place]. It has not been cleared or approved by the U.S. Food and Drug Administration. This assay has been validated pursuant to the CLIA regulations and is used for clinical purposes. Marland Kitchen   CBC with Differential/Platelet     Status: None   Collection Time: 01/08/19  9:34 AM  Result Value Ref Range   WBC 8.9 3.8 - 10.8 Thousand/uL   RBC 4.56 3.80 - 5.10 Million/uL   Hemoglobin 14.2 11.7 - 15.5 g/dL   HCT 78.2 95.6 - 21.3 %   MCV 93.4 80.0 - 100.0 fL   MCH 31.1 27.0 - 33.0 pg   MCHC 33.3 32.0 - 36.0 g/dL   RDW 08.6 57.8 - 46.9 %   Platelets 250 140 - 400 Thousand/uL   MPV 10.5 7.5 - 12.5 fL   Neutro Abs 6,132 1,500 - 7,800 cells/uL   Lymphs Abs 2,136 850 - 3,900 cells/uL   Absolute Monocytes 454 200 - 950 cells/uL   Eosinophils Absolute 107 15 - 500 cells/uL   Basophils Absolute 71 0 - 200 cells/uL   Neutrophils Relative % 68.9 %   Total Lymphocyte 24.0 %   Monocytes Relative 5.1 %   Eosinophils Relative 1.2 %   Basophils Relative 0.8 %  Clozapine (clozaril)     Status: None   Collection Time: 01/21/19  9:12 AM  Result Value Ref Range   NorClozapine 228  25 - 400 mcg/L   Clozapine Lvl 361 mcg/L    Comment: . Reference Range for Clozapine: . The therapeutic response begins to appear at 100 mcg/L.  Refractory schizophrenia appears to require a therapeutic concentration of at least 350 mcg/L (trough, at steady state). . Toxic range: Greater than 1000 mcg/L . This test was developed and its analytical performance characteristics have been determined by [facility] [place], [place]. It has not been cleared or approved by the U.S. Food and Drug Administration. This assay has been validated pursuant to the CLIA regulations and is used for clinical purposes. Marland Kitchen   CBC with Differential/Platelet     Status: None   Collection Time: 01/21/19  9:12  AM  Result Value Ref Range   WBC 7.8 3.8 - 10.8 Thousand/uL   RBC 4.42 3.80 - 5.10 Million/uL   Hemoglobin 14.1 11.7 - 15.5 g/dL   HCT 41.5 35.0 - 45.0 %   MCV 93.9 80.0 - 100.0 fL   MCH 31.9 27.0 - 33.0 pg   MCHC 34.0 32.0 - 36.0 g/dL   RDW 12.9 11.0 - 15.0 %   Platelets 253 140 - 400 Thousand/uL   MPV 10.6 7.5 - 12.5 fL   Neutro Abs 4,930 1,500 - 7,800 cells/uL   Lymphs Abs 2,153 850 - 3,900 cells/uL   Absolute Monocytes 460 200 - 950 cells/uL   Eosinophils Absolute 187 15 - 500 cells/uL   Basophils Absolute 70 0 - 200 cells/uL   Neutrophils Relative % 63.2 %   Total Lymphocyte 27.6 %   Monocytes Relative 5.9 %   Eosinophils Relative 2.4 %   Basophils Relative 0.9 %  Clozapine (clozaril)     Status: None   Collection Time: 02/10/19  8:56 AM  Result Value Ref Range   NorClozapine 266 25 - 400 mcg/L   Clozapine Lvl 315 mcg/L    Comment: . Reference Range for Clozapine: . The therapeutic response begins to appear at 100 mcg/L.  Refractory schizophrenia appears to require a therapeutic concentration of at least 350 mcg/L (trough, at steady state). . Toxic range: Greater than 1000 mcg/L . This test was developed and its analytical performance characteristics  have been determined by [place], [place]. It has not been cleared or approved by the U.S. Food and Drug Administration. This assay has been validated pursuant to the CLIA regulations and is used for clinical purposes. Marland Kitchen   CBC with Differential/Platelet     Status: None   Collection Time: 02/10/19  8:56 AM  Result Value Ref Range   WBC 10.1 3.8 - 10.8 Thousand/uL   RBC 4.34 3.80 - 5.10 Million/uL   Hemoglobin 13.9 11.7 - 15.5 g/dL   HCT 41.2 35.0 - 45.0 %   MCV 94.9 80.0 - 100.0 fL   MCH 32.0 27.0 - 33.0 pg   MCHC 33.7 32.0 - 36.0 g/dL   RDW 13.1 11.0 - 15.0 %   Platelets 253 140 - 400 Thousand/uL   MPV 11.0 7.5 - 12.5 fL   Neutro Abs 6,686 1,500 - 7,800 cells/uL   Lymphs Abs 2,586 850 - 3,900 cells/uL   Absolute Monocytes 616 200 - 950 cells/uL   Eosinophils Absolute 152 15 - 500 cells/uL   Basophils Absolute 61 0 - 200 cells/uL   Neutrophils Relative % 66.2 %   Total Lymphocyte 25.6 %   Monocytes Relative 6.1 %   Eosinophils Relative 1.5 %   Basophils Relative 0.6 %  Clozapine (clozaril)     Status: None   Collection Time: 02/24/19  1:47 PM  Result Value Ref Range   NorClozapine 266 25 - 400 mcg/L   Clozapine Lvl 368 mcg/L    Comment: . Reference Range for Clozapine: . The therapeutic response begins to appear at 100 mcg/L.  Refractory schizophrenia appears to require a therapeutic concentration of at least 350 mcg/L (trough, at steady state). . Toxic range: Greater than 1000 mcg/L . This test was developed and its analytical performance characteristics have been determined by [place], [place]. It has not been cleared or approved by the U.S. Food and Drug Administration. This assay has been validated pursuant to the CLIA regulations and is  used for clinical purposes. Marland Kitchen   CBC with Differential/Platelet     Status: None   Collection Time: 02/24/19  1:47 PM  Result Value Ref Range   WBC 10.3 3.8  - 10.8 Thousand/uL   RBC 4.51 3.80 - 5.10 Million/uL   Hemoglobin 14.4 11.7 - 15.5 g/dL   HCT 71.6 96.7 - 89.3 %   MCV 94.5 80.0 - 100.0 fL   MCH 31.9 27.0 - 33.0 pg   MCHC 33.8 32.0 - 36.0 g/dL   RDW 81.0 17.5 - 10.2 %   Platelets 299 140 - 400 Thousand/uL   MPV 11.0 7.5 - 12.5 fL   Neutro Abs 7,529 1,500 - 7,800 cells/uL   Lymphs Abs 2,009 850 - 3,900 cells/uL   Absolute Monocytes 556 200 - 950 cells/uL   Eosinophils Absolute 155 15 - 500 cells/uL   Basophils Absolute 52 0 - 200 cells/uL   Neutrophils Relative % 73.1 %   Total Lymphocyte 19.5 %   Monocytes Relative 5.4 %   Eosinophils Relative 1.5 %   Basophils Relative 0.5 %     Psychiatric Specialty Exam: Physical Exam  ROS  There were no vitals taken for this visit.There is no height or weight on file to calculate BMI.  General Appearance: NA  Eye Contact:  NA  Speech:  fast and rambling  Volume:  Increased  Mood:  Anxious and Depressed  Affect:  NA  Thought Process:  Descriptions of Associations: Circumstantial  Orientation:  Full (Time, Place, and Person)  Thought Content:  Hallucinations: people calling my names, Rumination and nightmares  Suicidal Thoughts:  No  Homicidal Thoughts:  No  Memory:  Immediate;   Fair Recent;   Fair Remote;   Fair  Judgement:  Fair  Insight:  Fair  Psychomotor Activity:  NA  Concentration:  Concentration: Fair and Attention Span: Fair  Recall:  [employer] of Knowledge:  Fair  Language:  Fair  Akathisia:  No  Handed:  Right  AIMS (if indicated):     Assets:  Communication Skills Desire for Improvement Social Support  ADL's:  Intact  Cognition:  Impaired,  Mild  Sleep:   fair      Assessment and Plan: Schizophrenia chronic paranoid type.  Posttraumatic stress disorder.  Anxiety.  I discussed current situation with Danielle [name].  Encouraged to talk to [name] on a daily basis.  Patient is also hoping to make a trip on Thanksgiving to see Danielle [name].  Recommended not to  change the medication since she is already taking a moderate dose of clozapine with other medication.  I encourage she should schedule more often therapy visits with [name] to help Danielle coping skills.  She has residual nightmares and flashback but they are not as intense.  I reviewed blood work results.  Continue Remeron 45 mg at bedtime, Minipress 2 mg at bedtime, Klonopin 0.5 mg 3 times a day and clozapine 50 mg in the morning, 100 mg in the afternoon and 150 mg at bedtime.  Recommended to call us back if symptoms started to get worse.  We will follow up in 6 weeks once she returns from [place] to visit Danielle [name].  Time spent 30 minutes.  More than 50% of the time spent in psychoeducation, counseling, coronation of care, reviewed blood work results and discussing long-term prognosis.  Follow Up Instructions:    I discussed the assessment and treatment plan with the patient. The patient was provided an opportunity to ask questions  and all were answered. The patient agreed with the plan and demonstrated an understanding of the instructions.   The patient was advised to call back or seek an in-person evaluation if the symptoms worsen or if the condition fails to improve as anticipated.  I provided 30 minutes of non-face-to-face time during this encounter.   Cleotis Nipper, MD

## 2019-03-23 ENCOUNTER — Encounter (HOSPITAL_COMMUNITY): Payer: Self-pay | Admitting: Licensed Clinical Social Worker

## 2019-03-23 ENCOUNTER — Other Ambulatory Visit (HOSPITAL_COMMUNITY): Payer: Self-pay | Admitting: Psychiatry

## 2019-03-23 ENCOUNTER — Other Ambulatory Visit: Payer: Self-pay

## 2019-03-23 ENCOUNTER — Ambulatory Visit (INDEPENDENT_AMBULATORY_CARE_PROVIDER_SITE_OTHER): Payer: BC Managed Care – PPO | Admitting: Licensed Clinical Social Worker

## 2019-03-23 DIAGNOSIS — F200 Paranoid schizophrenia: Secondary | ICD-10-CM | POA: Diagnosis not present

## 2019-03-23 DIAGNOSIS — F4310 Post-traumatic stress disorder, unspecified: Secondary | ICD-10-CM | POA: Diagnosis not present

## 2019-03-23 NOTE — Progress Notes (Signed)
Virtual Visit via Video Note  I connected with Danielle [name] on 03/23/19 at  8:00 AM EST by a video enabled telemedicine application and verified that I am speaking with the correct person using two identifiers.    I discussed the limitations of evaluation and management by telemedicine and the availability of in person appointments. The patient expressed understanding and agreed to proceed.  Type of Therapy: Individual Therapy  Treatment Goals addressed: Improve Psychiatric Symptoms, Emotional Regulation Skills, Calming Skills, Healthy Coping Skills, discuss and process traumatic event, reduced irrational worries and fears  Interventions: Motivational Interviewing, Cognitive Behavior Therapy and Grounding/Mindfulness Skills, psychoeducation  Summary: Danielle [name] is a48y.o. female who presents with Schizophrenia and PTSD.  Suicidal/Homicidal: No - without intent/plan  Therapist Response:  Danielle [name] met with clinician for an individual session. Danielle [name] discussed her psychiatric symptoms, her current life events. Danielle [name] shared thatshe has been okay since last session. Things seem to have calmed down at home and in the family, for the most part. Danielle [name] reports her sxs are relatively stable. She reported one major panic attack last night at Danielle [name]. Clinician explored triggers and noted that it was late and she had not taken her evening meds yet, as well as the crowd at Danielle [name]. Clinician explored coping skills and noted that Danielle [name] went out to the car, took deep breaths, got home and put on her CPAP mask. She reported after about 15-20 minutes, she felt better. Clinician processed the use of positive self talk, reminding herself that it is only a panic attack and she would be okay. Danielle [name] reports that her husband helps a lot by holding her hand and reassuring her in these times. Danielle [name] reports continuing separation anxiety or mini-panic attacks daily when her husband goes to work. Clinician  assisted in problem solving and discussed option for her to go to her mother-in-law's house when Danielle [name] is leaving for work at [employer], instead of waiting until 6pm to go there. Danielle [name] agreed to try this and will report back.   Plan: Return again in 2 weeks.  Diagnosis: Axis I: Schizophrenia, paranoid type and PTSD    I discussed the assessment and treatment plan with the patient. The patient was provided an opportunity to ask questions and all were answered. The patient agreed with the plan and demonstrated an understanding of the instructions.   The patient was advised to call back or seek an in-person evaluation if the symptoms worsen or if the condition fails to improve as anticipated.  I provided 45 minutes of non-face-to-face time during this encounter.   Danielle Curling, LCSW

## 2019-03-27 LAB — CBC WITH DIFFERENTIAL/PLATELET
Absolute Monocytes: 567 cells/uL (ref 200–950)
Basophils Absolute: 93 cells/uL (ref 0–200)
Basophils Relative: 1 %
Eosinophils Absolute: 177 cells/uL (ref 15–500)
Eosinophils Relative: 1.9 %
HCT: 42.4 % (ref 35.0–45.0)
Hemoglobin: 14.4 g/dL (ref 11.7–15.5)
Lymphs Abs: 2492 cells/uL (ref 850–3900)
MCH: 32 pg (ref 27.0–33.0)
MCHC: 34 g/dL (ref 32.0–36.0)
MCV: 94.2 fL (ref 80.0–100.0)
MPV: 11.2 fL (ref 7.5–12.5)
Monocytes Relative: 6.1 %
Neutro Abs: 5971 cells/uL (ref 1500–7800)
Neutrophils Relative %: 64.2 %
Platelets: 286 10*3/uL (ref 140–400)
RBC: 4.5 10*6/uL (ref 3.80–5.10)
RDW: 12.8 % (ref 11.0–15.0)
Total Lymphocyte: 26.8 %
WBC: 9.3 10*3/uL (ref 3.8–10.8)

## 2019-03-27 LAB — CLOZAPINE (CLOZARIL)
Clozapine Lvl: 227 mcg/L
NorClozapine: 196 mcg/L (ref 25–400)

## 2019-04-06 ENCOUNTER — Other Ambulatory Visit: Payer: Self-pay

## 2019-04-06 ENCOUNTER — Ambulatory Visit (HOSPITAL_COMMUNITY): Payer: BC Managed Care – PPO | Admitting: Licensed Clinical Social Worker

## 2019-04-20 ENCOUNTER — Other Ambulatory Visit: Payer: Self-pay

## 2019-04-20 ENCOUNTER — Encounter (HOSPITAL_COMMUNITY): Payer: Self-pay | Admitting: Licensed Clinical Social Worker

## 2019-04-20 ENCOUNTER — Ambulatory Visit (INDEPENDENT_AMBULATORY_CARE_PROVIDER_SITE_OTHER): Payer: BC Managed Care – PPO | Admitting: Licensed Clinical Social Worker

## 2019-04-20 ENCOUNTER — Other Ambulatory Visit (HOSPITAL_COMMUNITY): Payer: Self-pay | Admitting: Psychiatry

## 2019-04-20 DIAGNOSIS — F4310 Post-traumatic stress disorder, unspecified: Secondary | ICD-10-CM | POA: Diagnosis not present

## 2019-04-20 DIAGNOSIS — F200 Paranoid schizophrenia: Secondary | ICD-10-CM | POA: Diagnosis not present

## 2019-04-20 NOTE — Progress Notes (Signed)
Virtual Visit via Video Note  I connected with [place] on 04/20/19 at  8:00 AM EST by a video enabled telemedicine application and verified that I am speaking with the correct person using two identifiers.    I discussed the limitations of evaluation and management by telemedicine and the availability of in person appointments. The patient expressed understanding and agreed to proceed.  Type of Therapy: Individual Therapy  Treatment Goals addressed: Improve Psychiatric Symptoms, Emotional Regulation Skills, Calming Skills, Healthy Coping Skills, discuss and process traumatic event, reduced irrational worries and fears  Interventions: Motivational Interviewing, Cognitive Behavior Therapy and Grounding/Mindfulness Skills, psychoeducation  Summary: Danielle [name] is a48y.o. female who presents with Schizophrenia and PTSD.  Suicidal/Homicidal: No - without intent/plan  Therapist Response:  Keylen met with clinician for an individual session. Makyra discussed her psychiatric symptoms, her current life events. Tamira shared thatshehas been okay since last session. She reports she has some concerns about her mother, who is having a blockage removed in an artery for her heart today. Clinician discussed Akyia's ongoing health concerns about her mother and processed ways for Dajanee to cope with the worry. Clinician discussed Emerly's relationship with mom and noted the feeling that mom has always been dismissive of her in general, but especially related to Rilynn's mental health. Madalee spent time reflecting on how mom has treated her regarding panic attacks and other more significant mental illness issues. Clinician identified the importance of seeking out support in that area with someone who understands and believes in mental illness.  Rickelle discussed current sxs and noted that last night she had several bad nightmares, even though she did not watch anything disturbing and did not have any  negative interactions with anyone. Eola reported that stress about mom has played a big part in her thoughts.   Plan: Return again in 2 weeks.  Diagnosis: Axis I: Schizophrenia, paranoid type and PTSD    I discussed the assessment and treatment plan with the patient. The patient was provided an opportunity to ask questions and all were answered. The patient agreed with the plan and demonstrated an understanding of the instructions.   The patient was advised to call back or seek an in-person evaluation if the symptoms worsen or if the condition fails to improve as anticipated.  I provided 45 minutes of non-face-to-face time during this encounter.   [name], LCSW

## 2019-04-22 ENCOUNTER — Telehealth (HOSPITAL_COMMUNITY): Payer: Self-pay

## 2019-04-22 ENCOUNTER — Ambulatory Visit (INDEPENDENT_AMBULATORY_CARE_PROVIDER_SITE_OTHER): Payer: BC Managed Care – PPO | Admitting: Psychiatry

## 2019-04-22 ENCOUNTER — Other Ambulatory Visit: Payer: Self-pay

## 2019-04-22 ENCOUNTER — Encounter (HOSPITAL_COMMUNITY): Payer: Self-pay | Admitting: Psychiatry

## 2019-04-22 VITALS — Wt 260.0 lb

## 2019-04-22 DIAGNOSIS — F4310 Post-traumatic stress disorder, unspecified: Secondary | ICD-10-CM

## 2019-04-22 DIAGNOSIS — F419 Anxiety disorder, unspecified: Secondary | ICD-10-CM | POA: Diagnosis not present

## 2019-04-22 DIAGNOSIS — F200 Paranoid schizophrenia: Secondary | ICD-10-CM | POA: Diagnosis not present

## 2019-04-22 MED ORDER — CLOZAPINE 50 MG PO TABS: ORAL_TABLET | ORAL | 2 refills | Status: DC

## 2019-04-22 MED ORDER — MIRTAZAPINE 45 MG PO TABS: 45.0000 mg | ORAL_TABLET | Freq: Every day | ORAL | 0 refills | Status: DC

## 2019-04-22 MED ORDER — CLONAZEPAM 0.5 MG PO TABS: 0.5000 mg | ORAL_TABLET | Freq: Three times a day (TID) | ORAL | 2 refills | Status: DC | PRN

## 2019-04-22 MED ORDER — PRAZOSIN HCL 2 MG PO CAPS: 2.0000 mg | ORAL_CAPSULE | Freq: Every day | ORAL | 0 refills | Status: DC

## 2019-04-22 NOTE — Progress Notes (Signed)
Virtual Visit via Telephone Note  I connected with Danielle [name] on 04/22/19 at  8:40 AM EST by telephone and verified that I am speaking with the correct person using two identifiers.   I discussed the limitations, risks, security and privacy concerns of performing an evaluation and management service by telephone and the availability of in person appointments. I also discussed with the patient that there may be a patient responsible charge related to this service. The patient expressed understanding and agreed to proceed.   History of Present Illness: Patient was evaluated by phone session.  She is compliant with medication and she noticed that she does not hear voices as much and does not have any suicidal thoughts.  On Thanksgiving she went to [name] to see her mother who has a lot of health issues.  She was sad to see her because she is deteriorating but also felt better after seeing in person.  Her mother back in [place] and currently staying with her mother.  Her mother lives in [place].  Overall patient feels the medicine is working.  We have increase clozapine few months ago and she is tolerating so far.  However admitted she had gained weight as recently she had bronchitis and she could not walk and does not leave the house because of cold weather.  She admitted sometimes severe anxiety when she is by herself.  Since her husband [name] working 6 days a week with increased hours she feels sometimes separation anxiety.  However she talks to him on a regular basis.  She also talks to her mother every day.  She denies any hallucination but admitted some time paranoid thoughts when she is by herself.  She usually have TV on so she does not feel alone.  She still have nightmares and flashback but they are chronic and stable.  Patient daughter left close by but she is not in touch with her and she is not sure if she will see her on Christmas.  She has no more dizziness, fall.  She is in therapy with Janett Billow  but is going well.  She is compliant with blood work and her CBC is normal.    Past Psychiatric History:Reviewed. H/Omultiple hospitalizationdue tomultiple suicidal attempt, psychosis, paranoia, hallucination and severe depression. H/Odrowning herself.Last admission inOctober 2019 at [facility].Tred multiple medications includesZoloft, amitriptyline, Wellbutrin, Latuda, Xanax, Valium, Risperdal, lithium, Haldol, Depakote, Zyprexa, Geodon, imipramine,InVega, temazepamand Paxil, lithium, loxapineand lamictal.Referred AACTteambutnot approved.  Recent Results (from the past 2160 hour(s))  Clozapine (clozaril)     Status: None   Collection Time: 02/10/19  8:56 AM  Result Value Ref Range   NorClozapine 266 25 - 400 mcg/L   Clozapine Lvl 315 mcg/L    Comment: . Reference Range for Clozapine: . The therapeutic response begins to appear at 100 mcg/L.  Refractory schizophrenia appears to require a therapeutic concentration of at least 350 mcg/L (trough, at steady state). . Toxic range: Greater than 1000 mcg/L . This test was developed and its analytical performance characteristics have been determined by [place], [place]. It has not been cleared or approved by the U.S. Food and Drug Administration. This assay has been validated pursuant to the CLIA regulations and is used for clinical purposes. Marland Kitchen   CBC with Differential/Platelet     Status: None   Collection Time: 02/10/19  8:56 AM  Result Value Ref Range   WBC 10.1 3.8 - 10.8 Thousand/uL   RBC 4.34 3.80 - 5.10 Million/uL  Hemoglobin 13.9 11.7 - 15.5 g/dL   HCT 40.941.2 81.135.0 - 91.445.0 %   MCV 94.9 80.0 - 100.0 fL   MCH 32.0 27.0 - 33.0 pg   MCHC 33.7 32.0 - 36.0 g/dL   RDW 78.213.1 95.611.0 - 21.315.0 %   Platelets 253 140 - 400 Thousand/uL   MPV 11.0 7.5 - 12.5 fL   Neutro Abs 6,686 1,500 - 7,800 cells/uL   Lymphs Abs 2,586 850 - 3,900 cells/uL   Absolute Monocytes 616 200 - 950  cells/uL   Eosinophils Absolute 152 15 - 500 cells/uL   Basophils Absolute 61 0 - 200 cells/uL   Neutrophils Relative % 66.2 %   Total Lymphocyte 25.6 %   Monocytes Relative 6.1 %   Eosinophils Relative 1.5 %   Basophils Relative 0.6 %  Clozapine (clozaril)     Status: None   Collection Time: 02/24/19  1:47 PM  Result Value Ref Range   NorClozapine 266 25 - 400 mcg/L   Clozapine Lvl 368 mcg/L    Comment: . Reference Range for Clozapine: . The therapeutic response begins to appear at 100 mcg/L.  Refractory schizophrenia appears to require a therapeutic concentration of at least 350 mcg/L (trough, at steady state). . Toxic range: Greater than 1000 mcg/L . This test was developed and its analytical performance characteristics have been determined by [facility] [place] [place]. It has not been cleared or approved by the U.S. Food and Drug Administration. This assay has been validated pursuant to the CLIA regulations and is used for clinical purposes. Marland Kitchen.   CBC with Differential/Platelet     Status: None   Collection Time: 02/24/19  1:47 PM  Result Value Ref Range   WBC 10.3 3.8 - 10.8 Thousand/uL   RBC 4.51 3.80 - 5.10 Million/uL   Hemoglobin 14.4 11.7 - 15.5 g/dL   HCT 08.642.6 57.835.0 - 46.945.0 %   MCV 94.5 80.0 - 100.0 fL   MCH 31.9 27.0 - 33.0 pg   MCHC 33.8 32.0 - 36.0 g/dL   RDW 62.913.0 52.811.0 - 41.315.0 %   Platelets 299 140 - 400 Thousand/uL   MPV 11.0 7.5 - 12.5 fL   Neutro Abs 7,529 1,500 - 7,800 cells/uL   Lymphs Abs 2,009 850 - 3,900 cells/uL   Absolute Monocytes 556 200 - 950 cells/uL   Eosinophils Absolute 155 15 - 500 cells/uL   Basophils Absolute 52 0 - 200 cells/uL   Neutrophils Relative % 73.1 %   Total Lymphocyte 19.5 %   Monocytes Relative 5.4 %   Eosinophils Relative 1.5 %   Basophils Relative 0.5 %  Clozapine (clozaril)     Status: None   Collection Time: 03/23/19  1:48 PM  Result Value Ref Range   NorClozapine 196 25 - 400 mcg/L    Clozapine Lvl 227 mcg/L    Comment: . Reference Range for Clozapine: . The therapeutic response begins to appear at 100 mcg/L.  Refractory schizophrenia appears to require a therapeutic concentration of at least 350 mcg/L (trough, at steady state). . Toxic range: Greater than 1000 mcg/L . This test was developed and its analytical performance characteristics have been determined by [facility] [place] [place]. It has not been cleared or approved by the U.S. Food and Drug Administration. This assay has been validated pursuant to the CLIA regulations and is used for clinical purposes. Marland Kitchen.   CBC with Differential/Platelet     Status: None   Collection Time: 03/23/19  1:48  PM  Result Value Ref Range   WBC 9.3 3.8 - 10.8 Thousand/uL   RBC 4.50 3.80 - 5.10 Million/uL   Hemoglobin 14.4 11.7 - 15.5 g/dL   HCT 10.1 75.1 - 02.5 %   MCV 94.2 80.0 - 100.0 fL   MCH 32.0 27.0 - 33.0 pg   MCHC 34.0 32.0 - 36.0 g/dL   RDW 85.2 77.8 - 24.2 %   Platelets 286 140 - 400 Thousand/uL   MPV 11.2 7.5 - 12.5 fL   Neutro Abs 5,971 1,500 - 7,800 cells/uL   Lymphs Abs 2,492 850 - 3,900 cells/uL   Absolute Monocytes 567 200 - 950 cells/uL   Eosinophils Absolute 177 15 - 500 cells/uL   Basophils Absolute 93 0 - 200 cells/uL   Neutrophils Relative % 64.2 %   Total Lymphocyte 26.8 %   Monocytes Relative 6.1 %   Eosinophils Relative 1.9 %   Basophils Relative 1.0 %  CBC with Differential/Platelet     Status: None   Collection Time: 04/20/19  2:06 PM  Result Value Ref Range   WBC 10.2 3.8 - 10.8 Thousand/uL   RBC 4.45 3.80 - 5.10 Million/uL   Hemoglobin 14.2 11.7 - 15.5 g/dL   HCT 35.3 61.4 - 43.1 %   MCV 92.6 80.0 - 100.0 fL   MCH 31.9 27.0 - 33.0 pg   MCHC 34.5 32.0 - 36.0 g/dL   RDW 54.0 08.6 - 76.1 %   Platelets 278 140 - 400 Thousand/uL   MPV 10.7 7.5 - 12.5 fL   Neutro Abs 7,069 1,500 - 7,800 cells/uL   Lymphs Abs 2,295 850 - 3,900 cells/uL   Absolute Monocytes 571  200 - 950 cells/uL   Eosinophils Absolute 194 15 - 500 cells/uL   Basophils Absolute 71 0 - 200 cells/uL   Neutrophils Relative % 69.3 %   Total Lymphocyte 22.5 %   Monocytes Relative 5.6 %   Eosinophils Relative 1.9 %   Basophils Relative 0.7 %      Psychiatric Specialty Exam: Physical Exam  Review of Systems  There were no vitals taken for this visit.There is no height or weight on file to calculate BMI.  General Appearance: NA  Eye Contact:  NA  Speech:  Clear and Coherent and fast  Volume:  Increased  Mood:  Anxious  Affect:  NA  Thought Process:  Descriptions of Associations: Circumstantial  Orientation:  Full (Time, Place, and Person)  Thought Content:  Rumination  Suicidal Thoughts:  No  Homicidal Thoughts:  No  Memory:  Immediate;   Fair Recent;   Fair Remote;   Fair  Judgement:  Intact  Insight:  Present  Psychomotor Activity:  NA  Concentration:  Concentration: Fair and Attention Span: Fair  Recall:  [employer] of Knowledge:  Fair  Language:  Good  Akathisia:  No  Handed:  Right  AIMS (if indicated):     Assets:  Communication Skills Desire for Improvement Housing  ADL's:  Intact  Cognition:  Impaired,  Mild  Sleep:   fair      Assessment and Plan: Schizophrenia chronic paranoid type.  Posttraumatic stress disorder.  Anxiety.  I reviewed blood work results.  Her CBC is normal.  I encourage since bronchitis is resolved and she can walk then she should stop walking 10 to 15 minutes to lose weight.  Patient feels the current medicine is working well.  I encourage to continue to talk to her mother every day as it  helps and keep appointment with [name] for therapy.  Patient has chronic nightmares and flashback but they are stable.  Continue Remeron 45 mg at bedtime, Minipress 2 mg at bedtime, Klonopin 0.5 mg 3 times a day and Klonopin 50 mg in the morning 100 mg in the afternoon and 150 mg at bedtime.  Recommended to call us back if she has any question or  any concern.  Follow-up in 3 months.    Follow Up Instructions:    I discussed the assessment and treatment plan with the patient. The patient was provided an opportunity to ask questions and all were answered. The patient agreed with the plan and demonstrated an understanding of the instructions.   The patient was advised to call back or seek an in-person evaluation if the symptoms worsen or if the condition fails to improve as anticipated.  I provided 20 minutes of non-face-to-face time during this encounter.   Cleotis Nipper, MD

## 2019-04-22 NOTE — Telephone Encounter (Signed)
Error

## 2019-04-25 LAB — CBC WITH DIFFERENTIAL/PLATELET
Absolute Monocytes: 571 cells/uL (ref 200–950)
Basophils Absolute: 71 cells/uL (ref 0–200)
Basophils Relative: 0.7 %
Eosinophils Absolute: 194 cells/uL (ref 15–500)
Eosinophils Relative: 1.9 %
HCT: 41.2 % (ref 35.0–45.0)
Hemoglobin: 14.2 g/dL (ref 11.7–15.5)
Lymphs Abs: 2295 cells/uL (ref 850–3900)
MCH: 31.9 pg (ref 27.0–33.0)
MCHC: 34.5 g/dL (ref 32.0–36.0)
MCV: 92.6 fL (ref 80.0–100.0)
MPV: 10.7 fL (ref 7.5–12.5)
Monocytes Relative: 5.6 %
Neutro Abs: 7069 cells/uL (ref 1500–7800)
Neutrophils Relative %: 69.3 %
Platelets: 278 10*3/uL (ref 140–400)
RBC: 4.45 10*6/uL (ref 3.80–5.10)
RDW: 12.8 % (ref 11.0–15.0)
Total Lymphocyte: 22.5 %
WBC: 10.2 10*3/uL (ref 3.8–10.8)

## 2019-04-25 LAB — CLOZAPINE (CLOZARIL)
Clozapine Lvl: 418 mcg/L
NorClozapine: 245 mcg/L (ref 25–400)

## 2019-05-03 ENCOUNTER — Telehealth (HOSPITAL_COMMUNITY): Payer: Self-pay

## 2019-05-03 NOTE — Telephone Encounter (Signed)
Lab results came in for this patient. Results are in your box for review. Thank you 

## 2019-05-11 ENCOUNTER — Encounter (HOSPITAL_COMMUNITY): Payer: Self-pay | Admitting: Licensed Clinical Social Worker

## 2019-05-11 ENCOUNTER — Other Ambulatory Visit: Payer: Self-pay

## 2019-05-11 ENCOUNTER — Ambulatory Visit (INDEPENDENT_AMBULATORY_CARE_PROVIDER_SITE_OTHER): Payer: BC Managed Care – PPO | Admitting: Licensed Clinical Social Worker

## 2019-05-11 DIAGNOSIS — F200 Paranoid schizophrenia: Secondary | ICD-10-CM | POA: Diagnosis not present

## 2019-05-11 DIAGNOSIS — F4310 Post-traumatic stress disorder, unspecified: Secondary | ICD-10-CM | POA: Diagnosis not present

## 2019-05-11 NOTE — Progress Notes (Signed)
Virtual Visit via Video Note  I connected with [place] on 05/11/19 at  8:00 AM EST by a video enabled telemedicine application and verified that I am speaking with the correct person using two identifiers.     I discussed the limitations of evaluation and management by telemedicine and the availability of in person appointments. The patient expressed understanding and agreed to proceed.  Type of Therapy: Individual Therapy  Treatment Goals addressed: Improve Psychiatric Symptoms, Emotional Regulation Skills, Calming Skills, Healthy Coping Skills, discuss and process traumatic event, reduced irrational worries and fears  Interventions: Motivational Interviewing, Cognitive Behavior Therapy and Grounding/Mindfulness Skills, psychoeducation  Summary: Danielle [name] is a48y.o. female who presents with Schizophrenia and PTSD.  Suicidal/Homicidal: No - without intent/plan  Therapist Response:  Danielle [name] met with clinician for an individual session. Danielle [name] discussed her psychiatric symptoms, her current life events. Danielle [name] shared thatshehas beenokay. She was bothered that her children did not do much for her over the holidays. However, she reports she did spend some time with her daughter, who looked clean, and her grandson. Clinician discussed interactions with other children and noted that her son got engaged to his girlfriend at Christmas and the other son does not speak to her. Clinician reflected that there is little she can do to change that issue, as it is up to her son to move forward.  Clinician discussed Danielle [name]'s thoughts and feelings about mom. She reported mom was doing well and now has a nurse coming to help her at home.  Clinician explored panic sxs. Danielle [name] reports ongoing panic attacks whenever her husband leaves for work in the evening. She also reports that she has been passing out 1-2 times per day. She is going to her doctor today to get blood drawn and to get a new  inhaler.  Clinician discussed coping skills and reviewed resources for mindfulness, safety planning, and distraction.   Plan: Return again in 2 weeks.  Diagnosis: Axis I: Schizophrenia, paranoid type and PTSD      I discussed the assessment and treatment plan with the patient. The patient was provided an opportunity to ask questions and all were answered. The patient agreed with the plan and demonstrated an understanding of the instructions.   The patient was advised to call back or seek an in-person evaluation if the symptoms worsen or if the condition fails to improve as anticipated.  I provided 40 minutes of non-face-to-face time during this encounter.   [name], LCSW

## 2019-05-12 ENCOUNTER — Other Ambulatory Visit (HOSPITAL_COMMUNITY): Payer: Self-pay | Admitting: Psychiatry

## 2019-05-15 LAB — CBC WITH DIFFERENTIAL/PLATELET
Absolute Monocytes: 406 cells/uL (ref 200–950)
Basophils Absolute: 59 cells/uL (ref 0–200)
Basophils Relative: 0.6 %
Eosinophils Absolute: 129 cells/uL (ref 15–500)
Eosinophils Relative: 1.3 %
HCT: 41.4 % (ref 35.0–45.0)
Hemoglobin: 14.1 g/dL (ref 11.7–15.5)
Lymphs Abs: 1742 cells/uL (ref 850–3900)
MCH: 32.3 pg (ref 27.0–33.0)
MCHC: 34.1 g/dL (ref 32.0–36.0)
MCV: 94.7 fL (ref 80.0–100.0)
MPV: 10.8 fL (ref 7.5–12.5)
Monocytes Relative: 4.1 %
Neutro Abs: 7564 cells/uL (ref 1500–7800)
Neutrophils Relative %: 76.4 %
Platelets: 255 10*3/uL (ref 140–400)
RBC: 4.37 10*6/uL (ref 3.80–5.10)
RDW: 13 % (ref 11.0–15.0)
Total Lymphocyte: 17.6 %
WBC: 9.9 10*3/uL (ref 3.8–10.8)

## 2019-05-15 LAB — CLOZAPINE (CLOZARIL)
Clozapine Lvl: 346 mcg/L
NorClozapine: 214 mcg/L (ref 25–400)

## 2019-05-25 ENCOUNTER — Other Ambulatory Visit: Payer: Self-pay

## 2019-05-25 ENCOUNTER — Ambulatory Visit (HOSPITAL_COMMUNITY): Payer: BC Managed Care – PPO | Admitting: Licensed Clinical Social Worker

## 2019-06-08 ENCOUNTER — Encounter (HOSPITAL_COMMUNITY): Payer: Self-pay | Admitting: Licensed Clinical Social Worker

## 2019-06-08 ENCOUNTER — Other Ambulatory Visit: Payer: Self-pay

## 2019-06-08 ENCOUNTER — Ambulatory Visit (INDEPENDENT_AMBULATORY_CARE_PROVIDER_SITE_OTHER): Payer: BC Managed Care – PPO | Admitting: Licensed Clinical Social Worker

## 2019-06-08 DIAGNOSIS — F200 Paranoid schizophrenia: Secondary | ICD-10-CM | POA: Diagnosis not present

## 2019-06-08 NOTE — Progress Notes (Signed)
Virtual Visit via Video Note  I connected with Danielle [name] on 06/08/19 at  8:00 AM EST by a video enabled telemedicine application and verified that I am speaking with the correct person using two identifiers.     I discussed the limitations of evaluation and management by telemedicine and the availability of in person appointments. The patient expressed understanding and agreed to proceed.  Type of Therapy: Individual Therapy  Treatment Goals addressed: Improve Psychiatric Symptoms, Emotional Regulation Skills, Calming Skills, Healthy Coping Skills, discuss and process traumatic event, reduced irrational worries and fears  Interventions: Motivational Interviewing, Cognitive Behavior Therapy and Grounding/Mindfulness Skills, psychoeducation  Summary: Danielle [name] is a48y.o. female who presents with Schizophrenia and PTSD.  Suicidal/Homicidal: No - without intent/plan  Therapist Response:  Danielle [name] met with clinician for an individual session. Danielle [name] discussed her psychiatric symptoms, her current life events. Danielle [name] shared thatshehas beenrecovering from breast surgery where several benign lumps had been removed. She reported everything went okay and nothing was cancerous. Clinician processed emotional state and noted that overall things are okay. Clinician utilized MI to reflect and summarize thoughts and feelings of sadness and some disappointment that her children have not been very close to her. Clinician reflected sadness and noted that she cannot control others and that she can only do what she can to be close and stay in touch, inviting them to visit, and being supportive. Danielle [name] engaged in deep breathing and self soothing throughout session. Clinician encouraged Danielle [name] to remain in her routine as much as possible, and to maintain appropriate hygiene on a regular basis to respect and love herself.   Plan: Return again in 2 weeks.  Diagnosis: Axis I: Schizophrenia, paranoid  type and PTSD    I discussed the assessment and treatment plan with the patient. The patient was provided an opportunity to ask questions and all were answered. The patient agreed with the plan and demonstrated an understanding of the instructions.   The patient was advised to call back or seek an in-person evaluation if the symptoms worsen or if the condition fails to improve as anticipated.  I provided 45 minutes of non-face-to-face time during this encounter.   [name], LCSW

## 2019-06-22 ENCOUNTER — Encounter (HOSPITAL_COMMUNITY): Payer: Self-pay | Admitting: Licensed Clinical Social Worker

## 2019-06-22 ENCOUNTER — Other Ambulatory Visit: Payer: Self-pay

## 2019-06-22 ENCOUNTER — Ambulatory Visit (INDEPENDENT_AMBULATORY_CARE_PROVIDER_SITE_OTHER): Payer: BC Managed Care – PPO | Admitting: Licensed Clinical Social Worker

## 2019-06-22 DIAGNOSIS — F200 Paranoid schizophrenia: Secondary | ICD-10-CM

## 2019-06-22 DIAGNOSIS — F4310 Post-traumatic stress disorder, unspecified: Secondary | ICD-10-CM | POA: Diagnosis not present

## 2019-06-22 NOTE — Progress Notes (Signed)
Virtual Visit via Video Note  I connected with [place] on 06/22/19 at  8:00 AM EST by a video enabled telemedicine application and verified that I am speaking with the correct person using two identifiers.     I discussed the limitations of evaluation and management by telemedicine and the availability of in person appointments. The patient expressed understanding and agreed to proceed.  Type of Therapy: Individual Therapy  Treatment Goals addressed: "to deal with my depression and severe anxiety". [name] will report improvement in depressed mood and anxiety/panic attacks 3 out of 7 days.   Interventions: Motivational Interviewing, Cognitive Behavior Therapy and Grounding/Mindfulness Skills, psychoeducation  Summary: Danielle [name] is a48y.o. female who presents with Schizophrenia and PTSD.  Suicidal/Homicidal: No - without intent/plan  Therapist Response:  Adam met with clinician for an individual session. Danielle [name] discussed her psychiatric symptoms, her current life events. Danielle [name] shared that she has been doing okay, but that she has a lot of sadness about some things going on in the family. Clinician utilized MI OARS to reflect and summarize thoughts and feelings about family interactions, including problems with her son and her mother. Clinician explored the impact of her son's secrecy about his wedding, relationship with fiancee, and other details about their life. Clinician explored Danielle [name]'s feelings about the fiancee and encouraged Danielle [name] to examine her behavior in the past toward her. Clinician discussed reaction to mom's statement that she did something "stupid". Clinician processed Danielle [name]'s relationship with mom over the years and noted that it is unlikely mom will change. Clinician utilized CBT reality testing and encouraged use of mindfulness coping skills and preparation before she calls. Clinician guided her through a mindfulness activity for coping with anxiety using ice  cubes.  Clinician and Danielle [name] updated treatment plan.   Plan: Return again in 2 weeks.  Diagnosis: Axis I: Schizophrenia, paranoid type and PTSD      I discussed the assessment and treatment plan with the patient. The patient was provided an opportunity to ask questions and all were answered. The patient agreed with the plan and demonstrated an understanding of the instructions.   The patient was advised to call back or seek an in-person evaluation if the symptoms worsen or if the condition fails to improve as anticipated.  I provided 45 minutes of non-face-to-face time during this encounter.   [name], LCSW

## 2019-06-25 ENCOUNTER — Other Ambulatory Visit (HOSPITAL_COMMUNITY): Payer: Self-pay | Admitting: Psychiatry

## 2019-06-27 LAB — CBC WITH DIFFERENTIAL/PLATELET
Absolute Monocytes: 502 cells/uL (ref 200–950)
Basophils Absolute: 70 cells/uL (ref 0–200)
Basophils Relative: 0.8 %
Eosinophils Absolute: 176 cells/uL (ref 15–500)
Eosinophils Relative: 2 %
HCT: 43.5 % (ref 35.0–45.0)
Hemoglobin: 14.6 g/dL (ref 11.7–15.5)
Lymphs Abs: 2614 cells/uL (ref 850–3900)
MCH: 31.6 pg (ref 27.0–33.0)
MCHC: 33.6 g/dL (ref 32.0–36.0)
MCV: 94.2 fL (ref 80.0–100.0)
MPV: 10.5 fL (ref 7.5–12.5)
Monocytes Relative: 5.7 %
Neutro Abs: 5438 cells/uL (ref 1500–7800)
Neutrophils Relative %: 61.8 %
Platelets: 341 10*3/uL (ref 140–400)
RBC: 4.62 10*6/uL (ref 3.80–5.10)
RDW: 12.9 % (ref 11.0–15.0)
Total Lymphocyte: 29.7 %
WBC: 8.8 10*3/uL (ref 3.8–10.8)

## 2019-06-27 LAB — CLOZAPINE (CLOZARIL)
Clozapine Lvl: 485 mcg/L
NorClozapine: 286 mcg/L (ref 25–400)

## 2019-07-06 ENCOUNTER — Other Ambulatory Visit: Payer: Self-pay

## 2019-07-06 ENCOUNTER — Ambulatory Visit (INDEPENDENT_AMBULATORY_CARE_PROVIDER_SITE_OTHER): Payer: BC Managed Care – PPO | Admitting: Licensed Clinical Social Worker

## 2019-07-06 ENCOUNTER — Encounter (HOSPITAL_COMMUNITY): Payer: Self-pay | Admitting: Licensed Clinical Social Worker

## 2019-07-06 ENCOUNTER — Telehealth (HOSPITAL_COMMUNITY): Payer: Self-pay | Admitting: Psychiatry

## 2019-07-06 DIAGNOSIS — F200 Paranoid schizophrenia: Secondary | ICD-10-CM

## 2019-07-06 DIAGNOSIS — F4310 Post-traumatic stress disorder, unspecified: Secondary | ICD-10-CM

## 2019-07-06 MED ORDER — BENZTROPINE MESYLATE 0.5 MG PO TABS: 0.5000 mg | ORAL_TABLET | Freq: Every day | ORAL | 0 refills | Status: DC

## 2019-07-06 NOTE — Progress Notes (Signed)
Virtual Visit via Video Note  I connected with Danielle [name] on 07/06/19 at  8:00 AM EST by a video enabled telemedicine application and verified that I am speaking with the correct person using two identifiers.     I discussed the limitations of evaluation and management by telemedicine and the availability of in person appointments. The patient expressed understanding and agreed to proceed.  Type of Therapy: Individual Therapy  Treatment Goals addressed: "to deal with my depression and severe anxiety". Danielle [name] will report improvement in depressed mood and anxiety/panic attacks 3 out of 7 days.   Interventions: Motivational Interviewing,Grounding/Mindfulness Skills  Summary: Danielle [name] is a50y.o. female who presents with Schizophrenia and PTSD.  Suicidal/Homicidal: No - without intent/plan  Therapist Response:  Danielle [name] met with clinician for an individual session. Danielle [name] discussed her psychiatric symptoms, her current life events. Danielle [name] reported that she was feeling okay. Clinician processed through current status of emotions, physical health, and family issues. Clinician utilized MI OARS to reflect and summarize concerns about mom, who is currently in the hospital. Clinician provided time and space for Danielle [name] to express her frustration with brother, who did not tell her right away. Clinician noted that he did not learn that mom was in the hospital until several hours after she was admitted. Clinician discussed relationships and concerns about children and husband. Clinician reflected worry and provided supportive feedback. Clinician also identified that Danielle [name] can only control herself and she does not have a lot of options when it comes to the decisions of others. Clinician discussed anxiety and noted that ice did not work as well as she would like, but noted that eventually she has been able to calm down from panic attack sxs. Clinician encouraged Danielle [name] to keep trying and to utilized  positive self talk to reassure herself under times of stress.  Danielle [name] reported excessive drooling and foaming at the mouth. Clinician reported this to Dr. Adele Schilder and requested him to give her a call today about these sxs.   Plan: Return again in 2 weeks.  Diagnosis: Axis I: Schizophrenia, paranoid type and PTSD     I discussed the assessment and treatment plan with the patient. The patient was provided an opportunity to ask questions and all were answered. The patient agreed with the plan and demonstrated an understanding of the instructions.   The patient was advised to call back or seek an in-person evaluation if the symptoms worsen or if the condition fails to improve as anticipated.  I provided 45 minutes of non-face-to-face time during this encounter.   [name], LCSW

## 2019-07-06 NOTE — Telephone Encounter (Signed)
Patient complaining of drooling and not happy because sometimes it causes foaming.  I spoke to patient and her husband [name] described that it could be medication side effects.  I recommend to cut down clozapine night dose from 150 to 100 mg and also we will add low-dose Cogentin to help the children.  If symptoms do not improve then she she can call us back.  I have called the Cogentin to her pharmacy.

## 2019-07-20 ENCOUNTER — Ambulatory Visit (HOSPITAL_COMMUNITY): Payer: BC Managed Care – PPO | Admitting: Licensed Clinical Social Worker

## 2019-07-21 ENCOUNTER — Ambulatory Visit (INDEPENDENT_AMBULATORY_CARE_PROVIDER_SITE_OTHER): Payer: BC Managed Care – PPO | Admitting: Psychiatry

## 2019-07-21 ENCOUNTER — Encounter (HOSPITAL_COMMUNITY): Payer: Self-pay

## 2019-07-21 ENCOUNTER — Other Ambulatory Visit: Payer: Self-pay

## 2019-07-21 ENCOUNTER — Encounter (HOSPITAL_COMMUNITY): Payer: Self-pay | Admitting: Psychiatry

## 2019-07-21 VITALS — Wt 250.0 lb

## 2019-07-21 DIAGNOSIS — F4310 Post-traumatic stress disorder, unspecified: Secondary | ICD-10-CM

## 2019-07-21 DIAGNOSIS — F419 Anxiety disorder, unspecified: Secondary | ICD-10-CM | POA: Diagnosis not present

## 2019-07-21 DIAGNOSIS — F200 Paranoid schizophrenia: Secondary | ICD-10-CM

## 2019-07-21 MED ORDER — BENZTROPINE MESYLATE 0.5 MG PO TABS: 0.5000 mg | ORAL_TABLET | Freq: Every day | ORAL | 2 refills | Status: DC

## 2019-07-21 MED ORDER — CLOZAPINE 50 MG PO TABS: ORAL_TABLET | ORAL | 2 refills | Status: DC

## 2019-07-21 MED ORDER — MIRTAZAPINE 45 MG PO TABS: 45.0000 mg | ORAL_TABLET | Freq: Every day | ORAL | 0 refills | Status: DC

## 2019-07-21 MED ORDER — PRAZOSIN HCL 2 MG PO CAPS: 2.0000 mg | ORAL_CAPSULE | Freq: Every day | ORAL | 0 refills | Status: DC

## 2019-07-21 MED ORDER — CLONAZEPAM 0.5 MG PO TABS: 0.5000 mg | ORAL_TABLET | Freq: Three times a day (TID) | ORAL | 2 refills | Status: DC | PRN
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
## 2019-07-21 NOTE — Telephone Encounter (Signed)
Opened in error

## 2019-08-03 ENCOUNTER — Ambulatory Visit (HOSPITAL_COMMUNITY): Payer: BC Managed Care – PPO | Admitting: Licensed Clinical Social Worker

## 2019-08-10 ENCOUNTER — Other Ambulatory Visit: Payer: Self-pay

## 2019-08-10 ENCOUNTER — Encounter (HOSPITAL_COMMUNITY): Payer: Self-pay | Admitting: Licensed Clinical Social Worker

## 2019-08-10 ENCOUNTER — Ambulatory Visit (INDEPENDENT_AMBULATORY_CARE_PROVIDER_SITE_OTHER): Payer: BC Managed Care – PPO | Admitting: Licensed Clinical Social Worker

## 2019-08-10 DIAGNOSIS — F4310 Post-traumatic stress disorder, unspecified: Secondary | ICD-10-CM | POA: Diagnosis not present

## 2019-08-10 DIAGNOSIS — F200 Paranoid schizophrenia: Secondary | ICD-10-CM | POA: Diagnosis not present
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
## 2019-08-11 ENCOUNTER — Other Ambulatory Visit (HOSPITAL_COMMUNITY): Payer: Self-pay | Admitting: Psychiatry

## 2019-08-15 LAB — CBC WITH DIFFERENTIAL/PLATELET
Absolute Monocytes: 731 cells/uL (ref 200–950)
Basophils Absolute: 97 cells/uL (ref 0–200)
Basophils Relative: 0.7 %
Eosinophils Absolute: 124 cells/uL (ref 15–500)
Eosinophils Relative: 0.9 %
HCT: 46.4 % — ABNORMAL HIGH (ref 35.0–45.0)
Hemoglobin: 15.8 g/dL — ABNORMAL HIGH (ref 11.7–15.5)
Lymphs Abs: 3229 cells/uL (ref 850–3900)
MCH: 32.6 pg (ref 27.0–33.0)
MCHC: 34.1 g/dL (ref 32.0–36.0)
MCV: 95.9 fL (ref 80.0–100.0)
MPV: 10 fL (ref 7.5–12.5)
Monocytes Relative: 5.3 %
Neutro Abs: 9619 cells/uL — ABNORMAL HIGH (ref 1500–7800)
Neutrophils Relative %: 69.7 %
Platelets: 263 10*3/uL (ref 140–400)
RBC: 4.84 10*6/uL (ref 3.80–5.10)
RDW: 12.8 % (ref 11.0–15.0)
Total Lymphocyte: 23.4 %
WBC: 13.8 10*3/uL — ABNORMAL HIGH (ref 3.8–10.8)

## 2019-08-15 LAB — CLOZAPINE (CLOZARIL)
Clozapine Lvl: 299 mcg/L
NorClozapine: 195 mcg/L (ref 25–400)

## 2019-08-16 ENCOUNTER — Telehealth (HOSPITAL_COMMUNITY): Payer: Self-pay

## 2019-08-16 DIAGNOSIS — F200 Paranoid schizophrenia: Secondary | ICD-10-CM

## 2019-08-16 NOTE — Telephone Encounter (Signed)
Pt verbalized understanding of getting labs redrawn tomorrow for CBC with diff.

## 2019-08-16 NOTE — Telephone Encounter (Signed)
Received lab results for patient. Please review and advise.

## 2019-08-16 NOTE — Telephone Encounter (Signed)
Her WBC count and absolute neutrophil count is increased.  Please call the patient if she has any signs of infection.  She need to repeat her CBC.

## 2019-08-16 NOTE — Telephone Encounter (Signed)
She can cbc with diff tomorrow. Please order. Thanks

## 2019-08-16 NOTE — Telephone Encounter (Signed)
Spoke with pt. Per pt, she has had no symptoms other than "a bit of a sore throat" for approximately 1.5 weeks. Denies fever, chills, headache, GI upset.

## 2019-08-17 ENCOUNTER — Other Ambulatory Visit (HOSPITAL_COMMUNITY): Payer: Self-pay | Admitting: Psychiatry

## 2019-08-17 NOTE — Telephone Encounter (Signed)
Opened in Error.

## 2019-08-20 LAB — CBC WITH DIFFERENTIAL/PLATELET
Absolute Monocytes: 732 {cells}/uL (ref 200–950)
Basophils Absolute: 85 {cells}/uL (ref 0–200)
Basophils Relative: 0.7 %
Eosinophils Absolute: 146 {cells}/uL (ref 15–500)
Eosinophils Relative: 1.2 %
HCT: 43.6 % (ref 35.0–45.0)
Hemoglobin: 14.6 g/dL (ref 11.7–15.5)
Lymphs Abs: 3062 {cells}/uL (ref 850–3900)
MCH: 32.2 pg (ref 27.0–33.0)
MCHC: 33.5 g/dL (ref 32.0–36.0)
MCV: 96 fL (ref 80.0–100.0)
MPV: 10.7 fL (ref 7.5–12.5)
Monocytes Relative: 6 %
Neutro Abs: 8174 {cells}/uL — ABNORMAL HIGH (ref 1500–7800)
Neutrophils Relative %: 67 %
Platelets: 240 Thousand/uL (ref 140–400)
RBC: 4.54 Million/uL (ref 3.80–5.10)
RDW: 12.9 % (ref 11.0–15.0)
Total Lymphocyte: 25.1 %
WBC: 12.2 Thousand/uL — ABNORMAL HIGH (ref 3.8–10.8)

## 2019-08-20 LAB — CLOZAPINE (CLOZARIL)
Clozapine Lvl: 617 mcg/L
NorClozapine: 288 mcg/L (ref 25–400)

## 2019-08-24 ENCOUNTER — Other Ambulatory Visit: Payer: Self-pay

## 2019-08-24 ENCOUNTER — Ambulatory Visit (HOSPITAL_COMMUNITY): Payer: BC Managed Care – PPO | Admitting: Licensed Clinical Social Worker

## 2019-08-24 ENCOUNTER — Telehealth (HOSPITAL_COMMUNITY): Payer: Self-pay | Admitting: Licensed Clinical Social Worker

## 2019-08-24 NOTE — Telephone Encounter (Signed)
Clinician logged in session on WebEx at 8:00am, emailed the link again at 8:10, and called at 8:15. Unable to reach Cade and no response by 8:25.  No show.

## 2019-08-30 ENCOUNTER — Telehealth (HOSPITAL_COMMUNITY): Payer: Self-pay

## 2019-08-30 NOTE — Telephone Encounter (Signed)
Pt's husband called. States pt's mother passed away yesterday and pt is having a difficult time. Husband is wondering if pt would be able to speak with MD or if she could get a prescription to "help get through". Next appointment 6/17.  CB: [phone]

## 2019-08-30 NOTE — Telephone Encounter (Signed)
I spoke to patient's husband also.  Patient's mother died at yesterday and she has been having a hard time.  I recommend she can try extra Klonopin only if she had a crisis.  She is only taking to many medication and I am afraid to give more standing medication.  I explained she is going through grief and may need an earlier appointment with [name].  We will send message to schedule appointment with [name].

## 2019-09-01 ENCOUNTER — Other Ambulatory Visit: Payer: Self-pay

## 2019-09-01 ENCOUNTER — Encounter (HOSPITAL_COMMUNITY): Payer: Self-pay | Admitting: Licensed Clinical Social Worker

## 2019-09-01 ENCOUNTER — Ambulatory Visit (INDEPENDENT_AMBULATORY_CARE_PROVIDER_SITE_OTHER): Payer: BC Managed Care – PPO | Admitting: Licensed Clinical Social Worker

## 2019-09-01 DIAGNOSIS — F200 Paranoid schizophrenia: Secondary | ICD-10-CM

## 2019-09-01 DIAGNOSIS — F4321 Adjustment disorder with depressed mood: Secondary | ICD-10-CM | POA: Diagnosis not present

## 2019-09-01 DIAGNOSIS — F4310 Post-traumatic stress disorder, unspecified: Secondary | ICD-10-CM

## 2019-09-01 NOTE — Progress Notes (Signed)
Virtual Visit via Video Note  I connected with [place] on 09/01/19 at  9:00 AM EDT by a video enabled telemedicine application and verified that I am speaking with the correct person using two identifiers.    I discussed the limitations of evaluation and management by telemedicine and the availability of in person appointments. The patient expressed understanding and agreed to proceed.  Type of Therapy: Individual Therapy   Treatment Goals addressed: "to deal with my depression and severe anxiety". Oma will report improvement in depressed mood and anxiety/panic attacks 3 out of 7 days.    Interventions: Motivational Interviewing, grief/supportive counseling   Summary: WESTLYNN FIFER is a 49 y.o. female who presents with Grief, Schizophrenia and PTSD.   Suicidal/Homicidal: No - without intent/plan   Therapist Response:   Scotlynn met with clinician for an individual session.  Lille discussed her psychiatric symptoms, her current life events. Vernia reported that it was a really bad day due to her mother dying over the weekend. Clinician utilized MI OARS and grief counseling to process thoughts and feelings. Clinician provided supportive feedback about her feelings and identified that no medication would be able to change how she feels, since it was due to the death of mother. Clinician processed positive interactions and encouraged Alysha to tell good stories about mom, in order to get the memories flowing. Clinician also identified the importance of crying and grieving the loss, so as not to get stuck. Loreen reported that the funeral will be this weekend in [name], so she, husband, and son Remo Lipps will go. Clinician validated pain and identified the strength apparent in Fatimata to be able to cope and deal with this event.  Plan: Return again in 2 weeks.   Diagnosis: Axis I: Grief, Schizophrenia, paranoid type and PTSD       I discussed the assessment and treatment plan with the  patient. The patient was provided an opportunity to ask questions and all were answered. The patient agreed with the plan and demonstrated an understanding of the instructions.   The patient was advised to call back or seek an in-person evaluation if the symptoms worsen or if the condition fails to improve as anticipated.  I provided 45 minutes of non-face-to-face time during this encounter.   [name], LCSW

## 2019-09-07 ENCOUNTER — Ambulatory Visit (HOSPITAL_COMMUNITY): Payer: BC Managed Care – PPO | Admitting: Licensed Clinical Social Worker

## 2019-09-08 ENCOUNTER — Encounter (HOSPITAL_COMMUNITY): Payer: Self-pay | Admitting: Licensed Clinical Social Worker

## 2019-09-08 ENCOUNTER — Ambulatory Visit (INDEPENDENT_AMBULATORY_CARE_PROVIDER_SITE_OTHER): Payer: BC Managed Care – PPO | Admitting: Licensed Clinical Social Worker

## 2019-09-08 ENCOUNTER — Other Ambulatory Visit: Payer: Self-pay

## 2019-09-08 DIAGNOSIS — F4321 Adjustment disorder with depressed mood: Secondary | ICD-10-CM | POA: Diagnosis not present

## 2019-09-08 DIAGNOSIS — F4310 Post-traumatic stress disorder, unspecified: Secondary | ICD-10-CM

## 2019-09-08 DIAGNOSIS — F200 Paranoid schizophrenia: Secondary | ICD-10-CM

## 2019-09-08 NOTE — Progress Notes (Signed)
Virtual Visit via Video Note  I connected with [place] on 09/08/19 at  2:30 PM EDT by a video enabled telemedicine application and verified that I am speaking with the correct person using two identifiers.     I discussed the limitations of evaluation and management by telemedicine and the availability of in person appointments. The patient expressed understanding and agreed to proceed.  Type of Therapy: Family Therapy   Treatment Goals addressed: "to deal with my depression and severe anxiety". Danielle [name] will report improvement in depressed mood and anxiety/panic attacks 3 out of 7 days.    Interventions: Motivational Interviewing, grief/supportive counseling   Summary: Danielle [name] is a 49 y.o. female who presents with Grief, Schizophrenia and PTSD.   Suicidal/Homicidal: No - without intent/plan   Therapist Response:   Danielle [name] and husband, Danielle [name], met with clinician for a family session.  Danielle [name] discussed Danielle [name]'s psychiatric symptoms, her current life events. Danielle [name] attempted to speak in session, but was experiencing a lot of stuttering and inability to get her thoughts out verbally, so she texted answers to questions and Danielle [name] was her interpreter. Clinician utilized MI OARS to reflect and summarize experience attending mother's funeral last weekend. Clinician processed seeing friends and family, some of whom she had not seen in 75 years. Clinician processed recent dream that mom told her the was okay. Clinician identified that this gave Danielle [name] some peace and a sense of closure. Clinician explored Danielle [name]'s experience of the weekend, noting that Danielle [name] fainted a few times and felt very overwhelmed during the trip. Clinician explored coping skills and encouraged using mindfulness as a way to bring herself out of anxious reactions.  Plan: Return again in 2 weeks.   Diagnosis: Axis I: Grief, Schizophrenia, paranoid type and PTSD      I discussed the assessment and treatment plan with the  patient. The patient was provided an opportunity to ask questions and all were answered. The patient agreed with the plan and demonstrated an understanding of the instructions.   The patient was advised to call back or seek an in-person evaluation if the symptoms worsen or if the condition fails to improve as anticipated.  I provided 45 minutes of non-face-to-face time during this encounter.   [name], LCSW

## 2019-09-21 ENCOUNTER — Ambulatory Visit (INDEPENDENT_AMBULATORY_CARE_PROVIDER_SITE_OTHER): Payer: BC Managed Care – PPO | Admitting: Licensed Clinical Social Worker

## 2019-09-21 ENCOUNTER — Other Ambulatory Visit: Payer: Self-pay

## 2019-09-21 ENCOUNTER — Encounter (HOSPITAL_COMMUNITY): Payer: Self-pay | Admitting: Licensed Clinical Social Worker

## 2019-09-21 DIAGNOSIS — F4321 Adjustment disorder with depressed mood: Secondary | ICD-10-CM | POA: Diagnosis not present

## 2019-09-21 DIAGNOSIS — F200 Paranoid schizophrenia: Secondary | ICD-10-CM | POA: Diagnosis not present

## 2019-09-21 DIAGNOSIS — F4310 Post-traumatic stress disorder, unspecified: Secondary | ICD-10-CM | POA: Diagnosis not present

## 2019-09-21 NOTE — Progress Notes (Signed)
Virtual Visit via Video Note  I connected with [name] on 09/21/19 at  8:00 AM EDT by a video enabled telemedicine application and verified that I am speaking with the correct person using two identifiers.     I discussed the limitations of evaluation and management by telemedicine and the availability of in person appointments. The patient expressed understanding and agreed to proceed.  Type of Therapy: Individual Therapy   Treatment Goals addressed: "to deal with my depression and severe anxiety". Liberti will report improvement in depressed mood and anxiety/panic attacks 3 out of 7 days.    Interventions: Motivational Interviewing, grief/supportive counseling   Summary: Danielle [name] is a 49 y.o. female who presents with Grief, Schizophrenia and PTSD.   Suicidal/Homicidal: No - without intent/plan   Therapist Response:   Danielle [name] met with clinician for an individual session.  Danielle [name] discussed her psychiatric symptoms, her current life events. Danielle [name] reports that she is feeling somewhat better. Clinician utilized MI OARS to reflect and summarize thoughts and feelings through her process of grieving her mother's death. Clinician explored family interactions and feelings about daughter's recent release from prison. Clinician noted sadness due to daughter coming back to reclaim her dog. Clinician processed plans and feelings about upcoming travel, reporting the excitement and positive feelings about getting away and spending time outdoors while camping.   Plan: Return again in 2 weeks.   Diagnosis: Axis I: Grief, Schizophrenia, paranoid type and PTSD       I discussed the assessment and treatment plan with the patient. The patient was provided an opportunity to ask questions and all were answered. The patient agreed with the plan and demonstrated an understanding of the instructions.   The patient was advised to call back or seek an in-person evaluation if the symptoms worsen or if the  condition fails to improve as anticipated.  I provided 50 minutes of non-face-to-face time during this encounter.   Danielle Curling, LCSW

## 2019-10-05 ENCOUNTER — Ambulatory Visit (HOSPITAL_COMMUNITY): Payer: BC Managed Care – PPO | Admitting: Licensed Clinical Social Worker

## 2019-10-05 ENCOUNTER — Other Ambulatory Visit: Payer: Self-pay

## 2019-10-05 ENCOUNTER — Telehealth (HOSPITAL_COMMUNITY): Payer: Self-pay | Admitting: Licensed Clinical Social Worker

## 2019-10-05 NOTE — Telephone Encounter (Signed)
Clinician called for scheduled appointment. Danielle [name] was feeling very sick and was unable to attend the session. Husband reported she had been doing pretty well and they were hoping to go out of town tomorrow on vacation.  Clinician affirmed next scheduled appointment.   Session cancelled due to illness.

## 2019-10-19 ENCOUNTER — Other Ambulatory Visit: Payer: Self-pay

## 2019-10-19 ENCOUNTER — Encounter (HOSPITAL_COMMUNITY): Payer: Self-pay | Admitting: Licensed Clinical Social Worker

## 2019-10-19 ENCOUNTER — Ambulatory Visit (INDEPENDENT_AMBULATORY_CARE_PROVIDER_SITE_OTHER): Payer: BC Managed Care – PPO | Admitting: Licensed Clinical Social Worker

## 2019-10-19 DIAGNOSIS — F200 Paranoid schizophrenia: Secondary | ICD-10-CM

## 2019-10-19 DIAGNOSIS — F4321 Adjustment disorder with depressed mood: Secondary | ICD-10-CM

## 2019-10-19 NOTE — Progress Notes (Signed)
Virtual Visit via Telephone Note  I connected with Danielle [name] on 10/19/19 at  8:00 AM EDT by telephone and verified that I am speaking with the correct person using two identifiers.  Location: Patient: home Provider: office   I discussed the limitations, risks, security and privacy concerns of performing an evaluation and management service by telephone and the availability of in person appointments. I also discussed with the patient that there may be a patient responsible charge related to this service. The patient expressed understanding and agreed to proceed.  Type of Therapy: Individual Therapy   Treatment Goals addressed: "to deal with my depression and severe anxiety". Danielle [name] will report improvement in depressed mood and anxiety/panic attacks 3 out of 7 days.    Interventions: Motivational Interviewing, grief/supportive counseling   Summary: Danielle [name] is a 49 y.o. female who presents with Grief, Schizophrenia and PTSD.   Suicidal/Homicidal: No - without intent/plan   Therapist Response:   Danielle [name] met with clinician for an individual session.  Danielle [name] discussed her psychiatric symptoms, her current life events. Danielle [name] reports that she is doing okay. Clinician utilized MI OARS to reflect and summarize updates in life, including follow up on camping trip. Clinician explored updates on sxs of anxiety and noted things have improved somewhat. Clinician processed ongoing anxiety and some recent nightmares about husband dying. Clinician explored ways to change dreams by focusing on a different story to tell. Clinician also explored process through grief and noted improvement in mood and conceptualization of mother's death. Clinician discussed updates on selling mother's house.  Plan: Return again in 2 weeks.   Diagnosis: Axis I: Grief, Schizophrenia, paranoid type and PTSD    I discussed the assessment and treatment plan with the patient. The patient was provided an opportunity to ask  questions and all were answered. The patient agreed with the plan and demonstrated an understanding of the instructions.   The patient was advised to call back or seek an in-person evaluation if the symptoms worsen or if the condition fails to improve as anticipated.  I provided 40 minutes of non-face-to-face time during this encounter.   [name], LCSW

## 2019-10-21 ENCOUNTER — Other Ambulatory Visit: Payer: Self-pay

## 2019-10-21 ENCOUNTER — Telehealth (INDEPENDENT_AMBULATORY_CARE_PROVIDER_SITE_OTHER): Payer: BC Managed Care – PPO | Admitting: Psychiatry

## 2019-10-21 ENCOUNTER — Other Ambulatory Visit (HOSPITAL_COMMUNITY): Payer: Self-pay

## 2019-10-21 DIAGNOSIS — F200 Paranoid schizophrenia: Secondary | ICD-10-CM | POA: Diagnosis not present

## 2019-10-21 DIAGNOSIS — Z79899 Other long term (current) drug therapy: Secondary | ICD-10-CM

## 2019-10-21 DIAGNOSIS — F4310 Post-traumatic stress disorder, unspecified: Secondary | ICD-10-CM | POA: Diagnosis not present

## 2019-10-21 MED ORDER — CLONAZEPAM 0.5 MG PO TABS: 0.5000 mg | ORAL_TABLET | Freq: Three times a day (TID) | ORAL | 2 refills | Status: DC | PRN

## 2019-10-21 MED ORDER — BENZTROPINE MESYLATE 0.5 MG PO TABS: 0.5000 mg | ORAL_TABLET | Freq: Every day | ORAL | 2 refills | Status: DC

## 2019-10-21 MED ORDER — PRAZOSIN HCL 2 MG PO CAPS: 2.0000 mg | ORAL_CAPSULE | Freq: Every day | ORAL | 0 refills | Status: DC

## 2019-10-21 MED ORDER — CLOZAPINE 50 MG PO TABS: ORAL_TABLET | ORAL | 1 refills | Status: DC

## 2019-10-21 MED ORDER — MIRTAZAPINE 45 MG PO TABS: 45.0000 mg | ORAL_TABLET | Freq: Every day | ORAL | 0 refills | Status: DC

## 2019-10-21 NOTE — Progress Notes (Signed)
Virtual Visit via Telephone Note  I connected with Danielle [name] on 10/21/19 at  8:20 AM EDT by telephone and verified that I am speaking with the correct person using two identifiers.   I discussed the limitations, risks, security and privacy concerns of performing an evaluation and management service by telephone and the availability of in person appointments. I also discussed with the patient that there may be a patient responsible charge related to this service. The patient expressed understanding and agreed to proceed.   Patient Location: Home Provider Location: Home office  History of Present Illness: Patient is evaluated by phone session.  She recently seen PCP, neurology and prescribed medication for seizures, headaches, general health needs.  She is not taking baclofen, Lamictal, potassium.  She feels that she is taking too much medication and like to cut down some of her psych medication.  She is doing well.  She admitted that she had a hard time after losing the mother in 09-15-2022.  Her mother died who was living in [place].  Patient did not able to go because funeral was restricted to few people.  Overall she feels the medicine is working.  She is still have some drooling but she feels Cogentin helps a lot.  She denies any paranoia, hallucination or any suicidal thoughts.  She still have sometimes nightmares and flashback and she gets anxious but denies any worsening of symptoms.  She is sleeping good.  Her husband is very supportive.  She like to cut down the clozapine dose.  She is getting labs regularly however we have not received results since 2022/09/15.  She has bronchitis and given antibiotics and steroids but now she is feeling better.  She is in therapy with [name] and that seems to be going well.  She reported her appetite is okay and weight is unchanged from the past.   Past Psychiatric History:Reviewed. H/Omultiple hospitalizationdue tomultiple suicidal attempt, psychosis,  paranoia, hallucination and severe depression. H/Odrowning herself.Last admission inOctober 2019 at [facility].Tred multiple medications includesZoloft, amitriptyline, Wellbutrin, Latuda, Xanax, Valium, Risperdal, lithium, Haldol, Depakote, Zyprexa, Geodon, imipramine,InVega, temazepamand Paxil, lithium, loxapineand lamictal.Referred AACTteambutnot approved.  Recent Results (from the past 09-15-2158 hour(s))  Clozapine (clozaril)     Status: None   Collection Time: 08/11/19  8:00 AM  Result Value Ref Range   NorClozapine 195 25 - 400 mcg/L   Clozapine Lvl 299 mcg/L    Comment: . Reference Range for Clozapine: . The therapeutic response begins to appear at 100 mcg/L.  Refractory schizophrenia appears to require a therapeutic concentration of at least 350 mcg/L (trough, at steady state). . Toxic range: Greater than 1000 mcg/L . This test was developed and its analytical performance characteristics have been determined by [facility] [place], [place]. It has not been cleared or approved by the U.S. Food and Drug Administration. This assay has been validated pursuant to the CLIA regulations and is used for clinical purposes. Marland Kitchen   CBC with Differential/Platelet     Status: Abnormal   Collection Time: 08/11/19  8:00 AM  Result Value Ref Range   WBC 13.8 (H) 3.8 - 10.8 Thousand/uL   RBC 4.84 3.80 - 5.10 Million/uL   Hemoglobin 15.8 (H) 11.7 - 15.5 g/dL   HCT 60.1 (H) 35 - 45 %   MCV 95.9 80.0 - 100.0 fL   MCH 32.6 27.0 - 33.0 pg   MCHC 34.1 32.0 - 36.0 g/dL   RDW 09.3 23.5 - 57.3 %  Platelets 263 140 - 400 Thousand/uL   MPV 10.0 7.5 - 12.5 fL   Neutro Abs 9,619 (H) 1,500 - 7,800 cells/uL   Lymphs Abs 3,229 850 - 3,900 cells/uL   Absolute Monocytes 731 200 - 950 cells/uL   Eosinophils Absolute 124 15 - 500 cells/uL   Basophils Absolute 97 0 - 200 cells/uL   Neutrophils Relative % 69.7 %   Total Lymphocyte 23.4 %   Monocytes Relative  5.3 %   Eosinophils Relative 0.9 %   Basophils Relative 0.7 %  Clozapine (clozaril)     Status: None   Collection Time: 08/17/19  3:43 PM  Result Value Ref Range   NorClozapine 288 25 - 400 mcg/L   Clozapine Lvl 617 mcg/L    Comment: . Reference Range for Clozapine: . The therapeutic response begins to appear at 100 mcg/L.  Refractory schizophrenia appears to require a therapeutic concentration of at least 350 mcg/L (trough, at steady state). . Toxic range: Greater than 1000 mcg/L . This test was developed and its analytical performance characteristics have been determined by [facility] [place], [place]. It has not been cleared or approved by the U.S. Food and Drug Administration. This assay has been validated pursuant to the CLIA regulations and is used for clinical purposes. Marland Kitchen   CBC with Differential/Platelet     Status: Abnormal   Collection Time: 08/17/19  3:43 PM  Result Value Ref Range   WBC 12.2 (H) 3.8 - 10.8 Thousand/uL   RBC 4.54 3.80 - 5.10 Million/uL   Hemoglobin 14.6 11.7 - 15.5 g/dL   HCT 50.2 35 - 45 %   MCV 96.0 80.0 - 100.0 fL   MCH 32.2 27.0 - 33.0 pg   MCHC 33.5 32.0 - 36.0 g/dL   RDW 77.4 12.8 - 78.6 %   Platelets 240 140 - 400 Thousand/uL   MPV 10.7 7.5 - 12.5 fL   Neutro Abs 8,174 (H) 1,500 - 7,800 cells/uL   Lymphs Abs 3,062 850 - 3,900 cells/uL   Absolute Monocytes 732 200 - 950 cells/uL   Eosinophils Absolute 146 15 - 500 cells/uL   Basophils Absolute 85 0 - 200 cells/uL   Neutrophils Relative % 67 %   Total Lymphocyte 25.1 %   Monocytes Relative 6.0 %   Eosinophils Relative 1.2 %   Basophils Relative 0.7 %      Psychiatric Specialty Exam: Physical Exam  Review of Systems  There were no vitals taken for this visit.There is no height or weight on file to calculate BMI.  General Appearance: NA  Eye Contact:  NA  Speech:  Normal Rate  Volume:  Decreased  Mood:  Anxious  Affect:  NA  Thought Process:   Descriptions of Associations: Intact  Orientation:  Full (Time, Place, and Person)  Thought Content:  Rumination  Suicidal Thoughts:  No  Homicidal Thoughts:  No  Memory:  Immediate;   Fair Recent;   Fair Remote;   Fair  Judgement:  Intact  Insight:  Present  Psychomotor Activity:  NA  Concentration:  Concentration: Fair and Attention Span: Fair  Recall:  [employer] of Knowledge:  Fair  Language:  Good  Akathisia:  No  Handed:  Right  AIMS (if indicated):     Assets:  Communication Skills Desire for Improvement Housing Resilience Social Support  ADL's:  Intact  Cognition:  WNL  Sleep:   ok      Assessment and Plan: Schizophrenia chronic paranoid type.  PTSD.  Anxiety.  Patient like to cut down further clozapine since she is feeling better.  We will cut down further clozapine to take 50 mg in the morning, 50 in the afternoon and 900 mg at bedtime.  I also reviewed her medication.  She is now taking baclofen, Lamictal, potassium, inhaler and headache medication.  We discussed polypharmacy.  I also reminded that if she noticed that her symptoms are coming back then she will go back to the original dose of clozapine and call us back.  I encouraged to continue therapy with Janett Billow.  We will follow-up with the lab to get the results on time for clozapine.  Treatment plan discussed with the patient and her husband who is present today.  I recommend to call us back if he has any question or any concern.  Follow-up in 2 months.  Time spent 25 minutes.  Follow Up Instructions:    I discussed the assessment and treatment plan with the patient. The patient was provided an opportunity to ask questions and all were answered. The patient agreed with the plan and demonstrated an understanding of the instructions.   The patient was advised to call back or seek an in-person evaluation if the symptoms worsen or if the condition fails to improve as anticipated.  I provided 25 minutes of  non-face-to-face time during this encounter.   Kathlee Nations, MD

## 2019-10-26 ENCOUNTER — Telehealth (HOSPITAL_COMMUNITY): Payer: Self-pay

## 2019-10-26 NOTE — Telephone Encounter (Signed)
Called patient to remind her to go and get her labs done at [facility]. Patient didn't answer - LVM

## 2019-10-29 ENCOUNTER — Telehealth (HOSPITAL_COMMUNITY): Payer: Self-pay

## 2019-10-29 NOTE — Telephone Encounter (Signed)
Spoke with patient's husband. They went today to get patient's labs done at [facility] in [place] and were told that the orders had expired so they sent patient home. I then spoke with [facility] and they stated that they had orders for 6/16 that had expired; however, the orders I faxed over to [facility] on 10/21/19 are still good and can be used. I called and LVM for patient so that when they go back to the lab on Monday morning they are aware of the conversation with [facility]

## 2019-11-02 ENCOUNTER — Other Ambulatory Visit: Payer: Self-pay

## 2019-11-02 ENCOUNTER — Ambulatory Visit (HOSPITAL_COMMUNITY): Payer: BC Managed Care – PPO | Admitting: Licensed Clinical Social Worker

## 2019-11-06 LAB — CBC WITH DIFFERENTIAL/PLATELET
Absolute Monocytes: 548 cells/uL (ref 200–950)
Basophils Absolute: 78 cells/uL (ref 0–200)
Basophils Relative: 0.9 %
Eosinophils Absolute: 139 cells/uL (ref 15–500)
Eosinophils Relative: 1.6 %
HCT: 45.6 % — ABNORMAL HIGH (ref 35.0–45.0)
Hemoglobin: 15.6 g/dL — ABNORMAL HIGH (ref 11.7–15.5)
Lymphs Abs: 2297 cells/uL (ref 850–3900)
MCH: 32.4 pg (ref 27.0–33.0)
MCHC: 34.2 g/dL (ref 32.0–36.0)
MCV: 94.8 fL (ref 80.0–100.0)
MPV: 11.2 fL (ref 7.5–12.5)
Monocytes Relative: 6.3 %
Neutro Abs: 5638 cells/uL (ref 1500–7800)
Neutrophils Relative %: 64.8 %
Platelets: 271 10*3/uL (ref 140–400)
RBC: 4.81 10*6/uL (ref 3.80–5.10)
RDW: 12.7 % (ref 11.0–15.0)
Total Lymphocyte: 26.4 %
WBC: 8.7 10*3/uL (ref 3.8–10.8)

## 2019-11-06 LAB — CLOZAPINE (CLOZARIL)
Clozapine Lvl: 438 mcg/L
NorClozapine: 234 mcg/L (ref 25–400)

## 2019-11-09 ENCOUNTER — Telehealth (HOSPITAL_COMMUNITY): Payer: Self-pay | Admitting: *Deleted

## 2019-11-09 NOTE — Telephone Encounter (Signed)
Received pt labs, CBC w/Diff and Clozaril level. Hgb and Hct both slightly high at 15.6 and 45.6 respectively.

## 2019-11-10 ENCOUNTER — Encounter (HOSPITAL_COMMUNITY): Payer: Self-pay | Admitting: Licensed Clinical Social Worker

## 2019-11-10 ENCOUNTER — Other Ambulatory Visit: Payer: Self-pay

## 2019-11-10 ENCOUNTER — Ambulatory Visit (INDEPENDENT_AMBULATORY_CARE_PROVIDER_SITE_OTHER): Payer: BC Managed Care – PPO | Admitting: Licensed Clinical Social Worker

## 2019-11-10 DIAGNOSIS — F4310 Post-traumatic stress disorder, unspecified: Secondary | ICD-10-CM | POA: Diagnosis not present

## 2019-11-10 DIAGNOSIS — F200 Paranoid schizophrenia: Secondary | ICD-10-CM

## 2019-11-10 DIAGNOSIS — F4321 Adjustment disorder with depressed mood: Secondary | ICD-10-CM | POA: Diagnosis not present

## 2019-11-10 NOTE — Progress Notes (Signed)
Virtual Visit via Video Note  I connected with [name] on 11/10/19 at  8:00 AM EDT by a video enabled telemedicine application and verified that I am speaking with the correct person using two identifiers.  Location: Patient: home Provider: office   I discussed the limitations of evaluation and management by telemedicine and the availability of in person appointments. The patient expressed understanding and agreed to proceed.  Type of Therapy: Individual Therapy   Treatment Goals addressed: "to deal with my depression and severe anxiety". Danielle [name] will report improvement in depressed mood and anxiety/panic attacks 3 out of 7 days.    Interventions: Motivational Interviewing, grief/supportive counseling   Summary: Danielle [name] is a 49 y.o. female who presents with Grief, Schizophrenia and PTSD.   Suicidal/Homicidal: No - without intent/plan   Therapist Response:   Danielle [name] met with clinician for an individual session.  Danielle [name] discussed her psychiatric symptoms, her current life events. Danielle [name] reports that she has been frequent pseudoseizures, which are brought on by high stress and heat. Clinician processed these events using MI. Clinician identified that historically Mindys stress has manifested in physical ways, such as deafness, inability to speak, and panic attacks. Clinician explored coping skills in order to increase self care and reduce the physical aspect of stress. Clinician provided options for using Mindfulness, sensory information, tapping, and positive self talk in order to relieve stress on a daily basis.  Plan: Return again in 2 weeks.   Diagnosis: Axis I: Grief, Schizophrenia, paranoid type and PTSD   I discussed the assessment and treatment plan with the patient. The patient was provided an opportunity to ask questions and all were answered. The patient agreed with the plan and demonstrated an understanding of the instructions.   The patient was advised to call back or  seek an in-person evaluation if the symptoms worsen or if the condition fails to improve as anticipated.  I provided 45 minutes of non-face-to-face time during this encounter.   [name], LCSW

## 2019-11-24 ENCOUNTER — Encounter (HOSPITAL_COMMUNITY): Payer: Self-pay | Admitting: Licensed Clinical Social Worker

## 2019-11-24 ENCOUNTER — Other Ambulatory Visit: Payer: Self-pay

## 2019-11-24 ENCOUNTER — Ambulatory Visit (INDEPENDENT_AMBULATORY_CARE_PROVIDER_SITE_OTHER): Payer: BC Managed Care – PPO | Admitting: Licensed Clinical Social Worker

## 2019-11-24 DIAGNOSIS — F200 Paranoid schizophrenia: Secondary | ICD-10-CM | POA: Diagnosis not present

## 2019-11-24 DIAGNOSIS — F4321 Adjustment disorder with depressed mood: Secondary | ICD-10-CM

## 2019-11-24 NOTE — Progress Notes (Signed)
Virtual Visit via Video Note  I connected with Danielle [name] on 11/24/19 at  8:00 AM EDT by a video enabled telemedicine application and verified that I am speaking with the correct person using two identifiers.  Location: Patient: home Provider: office   I discussed the limitations of evaluation and management by telemedicine and the availability of in person appointments. The patient expressed understanding and agreed to proceed.  Type of Therapy: Individual Therapy   Treatment Goals addressed: "to deal with my depression and severe anxiety". Bradley will report improvement in depressed mood and anxiety/panic attacks 3 out of 7 days.    Interventions: Deep breathing, Mindfulness   Summary: Danielle [name] is a 49 y.o. female who presents with Grief, Schizophrenia and PTSD.   Suicidal/Homicidal: No - without intent/plan   Therapist Response:   Danielle [name] met with clinician for an individual session.  Danielle [name] discussed her psychiatric symptoms, her current life events. Danielle [name] reports that she passed out last night and she has lost her memory. Clinician processed what was possible to remember, but Danielle [name] reported that she felt frustrated and scared because she was unable to remember what was going on. Clinician led Danielle [name] through deep breathing exercises and mindfulness techniques in order to calm down.   Plan: Return again in 2 weeks.   Diagnosis: Axis I: Grief, Schizophrenia, paranoid type and PTSD         I discussed the assessment and treatment plan with the patient. The patient was provided an opportunity to ask questions and all were answered. The patient agreed with the plan and demonstrated an understanding of the instructions.   The patient was advised to call back or seek an in-person evaluation if the symptoms worsen or if the condition fails to improve as anticipated.  I provided 30 minutes of non-face-to-face time during this encounter.   [name], LCSW

## 2019-12-02 ENCOUNTER — Other Ambulatory Visit (HOSPITAL_COMMUNITY): Payer: Self-pay

## 2019-12-02 DIAGNOSIS — Z79899 Other long term (current) drug therapy: Secondary | ICD-10-CM

## 2019-12-02 NOTE — Progress Notes (Unsigned)
Cbc

## 2019-12-07 LAB — CBC WITH DIFFERENTIAL/PLATELET
Absolute Monocytes: 492 cells/uL (ref 200–950)
Basophils Absolute: 84 cells/uL (ref 0–200)
Basophils Relative: 0.7 %
Eosinophils Absolute: 36 cells/uL (ref 15–500)
Eosinophils Relative: 0.3 %
HCT: 41.8 % (ref 35.0–45.0)
Hemoglobin: 14.3 g/dL (ref 11.7–15.5)
Lymphs Abs: 1752 cells/uL (ref 850–3900)
MCH: 32.6 pg (ref 27.0–33.0)
MCHC: 34.2 g/dL (ref 32.0–36.0)
MCV: 95.2 fL (ref 80.0–100.0)
MPV: 10.4 fL (ref 7.5–12.5)
Monocytes Relative: 4.1 %
Neutro Abs: 9636 cells/uL — ABNORMAL HIGH (ref 1500–7800)
Neutrophils Relative %: 80.3 %
Platelets: 290 10*3/uL (ref 140–400)
RBC: 4.39 10*6/uL (ref 3.80–5.10)
RDW: 12.8 % (ref 11.0–15.0)
Total Lymphocyte: 14.6 %
WBC: 12 10*3/uL — ABNORMAL HIGH (ref 3.8–10.8)

## 2019-12-07 LAB — CLOZAPINE (CLOZARIL)
Clozapine Lvl: 306 mcg/L
NorClozapine: 181 mcg/L (ref 25–400)

## 2019-12-08 ENCOUNTER — Encounter (HOSPITAL_COMMUNITY): Payer: Self-pay | Admitting: Licensed Clinical Social Worker

## 2019-12-08 ENCOUNTER — Ambulatory Visit (INDEPENDENT_AMBULATORY_CARE_PROVIDER_SITE_OTHER): Payer: BC Managed Care – PPO | Admitting: Licensed Clinical Social Worker

## 2019-12-08 ENCOUNTER — Other Ambulatory Visit: Payer: Self-pay

## 2019-12-08 DIAGNOSIS — F200 Paranoid schizophrenia: Secondary | ICD-10-CM | POA: Diagnosis not present

## 2019-12-08 DIAGNOSIS — F4321 Adjustment disorder with depressed mood: Secondary | ICD-10-CM | POA: Diagnosis not present

## 2019-12-08 DIAGNOSIS — F4310 Post-traumatic stress disorder, unspecified: Secondary | ICD-10-CM | POA: Diagnosis not present

## 2019-12-08 NOTE — Progress Notes (Signed)
Virtual Visit via Video Note  I connected with [place] on 12/08/19 at  8:00 AM EDT by a video enabled telemedicine application and verified that I am speaking with the correct person using two identifiers.  Location: Patient: home Provider: office   I discussed the limitations of evaluation and management by telemedicine and the availability of in person appointments. The patient expressed understanding and agreed to proceed.  Type of Therapy: Individual Therapy   Treatment Goals addressed: "to deal with my depression and severe anxiety". Danielle [name] will report improvement in depressed mood and anxiety/panic attacks 3 out of 7 days.    Interventions: CBT, coping skills   Summary: Danielle [name] is a 49 y.o. female who presents with Grief, Schizophrenia and PTSD.   Suicidal/Homicidal: No - without intent/plan   Therapist Response:   [name] met with clinician for an individual session.  Danielle [name] discussed her psychiatric symptoms, her current life events. Danielle [name] reports that she continues to have memory problems and lapses where she does not know where she is or what is happening. Clinician explored other sxs in these episodes and noted that last night she started hearing voices that told her that she is "stupid, dumb, and does not deserve to live". Danielle [name] reported seeing shadow people again last night for the first time in a year or more. Clinician explored triggers using CBT. Clinician identified that medication had been reduced in June. Danielle [name] reports she is not interested in increasing her dose again and hopes she can manage this on her own with coping skills. Clinician discussed options for writing herself a letter reminding her that this is a hallucination and that she is safe. Clinician also identified the possibility of having someone stay with her in the evening, but this did not seem feasible at this time. Clinician also discussed going to bed, holding her dog, and watching either the  Olympics or a comedy on tv to help her feel safe until she can fall asleep. Clinician updated Dr. Adele Schilder about sxs.  Plan: Return again in 2 weeks.   Diagnosis: Axis I: Grief, Schizophrenia, paranoid type and PTSD           I discussed the assessment and treatment plan with the patient. The patient was provided an opportunity to ask questions and all were answered. The patient agreed with the plan and demonstrated an understanding of the instructions.   The patient was advised to call back or seek an in-person evaluation if the symptoms worsen or if the condition fails to improve as anticipated.  I provided 45 minutes of non-face-to-face time during this encounter.   [name], LCSW

## 2019-12-22 ENCOUNTER — Ambulatory Visit (HOSPITAL_COMMUNITY): Payer: BC Managed Care – PPO | Admitting: Licensed Clinical Social Worker

## 2020-01-05 ENCOUNTER — Other Ambulatory Visit: Payer: Self-pay

## 2020-01-05 ENCOUNTER — Ambulatory Visit (INDEPENDENT_AMBULATORY_CARE_PROVIDER_SITE_OTHER): Payer: BC Managed Care – PPO | Admitting: Licensed Clinical Social Worker

## 2020-01-05 ENCOUNTER — Encounter (HOSPITAL_COMMUNITY): Payer: Self-pay | Admitting: Licensed Clinical Social Worker

## 2020-01-05 DIAGNOSIS — F200 Paranoid schizophrenia: Secondary | ICD-10-CM

## 2020-01-05 DIAGNOSIS — F4321 Adjustment disorder with depressed mood: Secondary | ICD-10-CM

## 2020-01-05 NOTE — Progress Notes (Signed)
Virtual Visit via Video Note  I connected with Malin on 01/05/20 at  8:00 AM EDT by a video enabled telemedicine application and verified that I am speaking with the correct person using two identifiers.  Location: Patient: home Provider: office   I discussed the limitations of evaluation and management by telemedicine and the availability of in person appointments. The patient expressed understanding and agreed to proceed.  Type of Therapy: Individual Therapy   Treatment Goals addressed: "to deal with my depression and severe anxiety". Danielle [name] will report improvement in depressed mood and anxiety/panic attacks 3 out of 7 days.    Interventions: Motivational Interviewing   Summary: [name] is a 49 y.o. female who presents with Grief, Schizophrenia and PTSD.   Suicidal/Homicidal: No - without intent/plan   Therapist Response:   Money met with clinician for an individual session.  Danielle [name] discussed her psychiatric symptoms, her current life events. Daja reports that she is doing better with her sxs and has not heard or seen any hallucinations in a few weeks. Clinician explored changes and noted that things have calmed down and she has not been so worried. Danielle [name] updated clinician on her mother's estate and noted that they would be updating their home with the inheritance. Clinician reflected the excitement about new floors and painting the walls. Clinician identified that this was a really positive use of the money and explored options for putting some away to cover any gaps in expenses. Clinician explored interactions with children. Danielle [name] reported some improvement in relationship with son and daughter, noting more interactions, phone calls, and time spent together. Danielle [name] also reported that husband will be working less, which is bad financially, but good for his health and her anxiety.   Plan: Return again in 2 weeks.   Diagnosis: Axis I: Grief, Schizophrenia, paranoid type and  PTSD         I discussed the assessment and treatment plan with the patient. The patient was provided an opportunity to ask questions and all were answered. The patient agreed with the plan and demonstrated an understanding of the instructions.   The patient was advised to call back or seek an in-person evaluation if the symptoms worsen or if the condition fails to improve as anticipated.  I provided 45 minutes of non-face-to-face time during this encounter.   Danielle Curling, LCSW

## 2020-01-19 ENCOUNTER — Ambulatory Visit (INDEPENDENT_AMBULATORY_CARE_PROVIDER_SITE_OTHER): Payer: BC Managed Care – PPO | Admitting: Licensed Clinical Social Worker

## 2020-01-19 ENCOUNTER — Other Ambulatory Visit: Payer: Self-pay

## 2020-01-19 ENCOUNTER — Emergency Department (INDEPENDENT_AMBULATORY_CARE_PROVIDER_SITE_OTHER)
Admission: RE | Admit: 2020-01-19 | Discharge: 2020-01-19 | Disposition: A | Discharge status: Home / Self Care | Payer: BC Managed Care – PPO | Source: Ambulatory Visit

## 2020-01-19 ENCOUNTER — Emergency Department (INDEPENDENT_AMBULATORY_CARE_PROVIDER_SITE_OTHER): Payer: BC Managed Care – PPO

## 2020-01-19 VITALS — BP 112/75 | HR 90 | Temp 98.4°F | Resp 21 | Ht 71.0 in | Wt 340.0 lb

## 2020-01-19 DIAGNOSIS — W010XXA Fall on same level from slipping, tripping and stumbling without subsequent striking against object, initial encounter: Secondary | ICD-10-CM | POA: Diagnosis not present

## 2020-01-19 DIAGNOSIS — M545 Low back pain: Secondary | ICD-10-CM | POA: Diagnosis not present

## 2020-01-19 DIAGNOSIS — F200 Paranoid schizophrenia: Secondary | ICD-10-CM

## 2020-01-19 DIAGNOSIS — M47817 Spondylosis without myelopathy or radiculopathy, lumbosacral region: Secondary | ICD-10-CM

## 2020-01-19 DIAGNOSIS — F4321 Adjustment disorder with depressed mood: Secondary | ICD-10-CM

## 2020-01-19 DIAGNOSIS — S3992XA Unspecified injury of lower back, initial encounter: Secondary | ICD-10-CM

## 2020-01-19 MED ORDER — MELOXICAM 15 MG PO TABS: 15.0000 mg | ORAL_TABLET | Freq: Every day | ORAL | 1 refills | Status: AC

## 2020-01-19 MED ORDER — KETOROLAC TROMETHAMINE 60 MG/2ML IM SOLN
60.0000 mg | Freq: Once | INTRAMUSCULAR | Status: AC
  Administered 2020-01-19: 60 mg via INTRAMUSCULAR

## 2020-01-19 MED ORDER — TIZANIDINE HCL 2 MG PO TABS: 2.0000 mg | ORAL_TABLET | Freq: Every day | ORAL | 0 refills | Status: AC

## 2020-01-19 NOTE — ED Provider Notes (Signed)
[facility]    CSN: [id number] Arrival date & time: 01/19/20  0946      History   Chief Complaint Chief Complaint  Patient presents with  . Back Pain    HPI Danielle [name] is a 49 y.o. female.    HPI  Presents today for evaluation of back pain after sustaining a fall earlier today which she slipped and fell landing on her back while at home.  She reports immediately experiencing pain.  She endorses some chronic intermittent back pain prior to fall today.  She is able to ambulate although endorses pain with any changes in position.  She does not take any medication for her symptoms today.  Denies any previous back injuries.  Denies numbness and tingling in lower extremities.  Denies any incontinence. Past Medical History:  Diagnosis Date  . Anemia   . Anxiety   . Bipolar 1 disorder (HCC)   . Depressed   . Headache(784.0)   . Heart murmur   . Hypothyroidism   . PTSD (post-traumatic stress disorder)   . Schizophrenia (HCC)   . Seizures (HCC)   . Shortness of breath     Patient Active Problem List   Diagnosis Date Noted  . Borderline personality disorder (HCC) 03/06/2018  . 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 Suicidal ideation 02/28/2018  . Severe major depression, single episode, with psychotic features (HCC) 02/27/2018  . Frequent falls 06/18/2015  . Tobacco use disorder 06/08/2015  . Hyperammonemia (HCC) 06/08/2015  . Paranoid schizophrenia (HCC)   . Migraine headache 06/04/2012  . Seizure (HCC) 06/04/2012  . Hypothyroidism 06/04/2012  . OSA (obstructive sleep apnea) 08/29/2011  . Post traumatic stress disorder (PTSD) 07/31/2011    Past Surgical History:  Procedure Laterality Date  . FOOT SURGERY Right    Rod removed  . LEG SURGERY    . NASAL FRACTURE SURGERY    . TUBAL LIGATION  1996    OB History   No obstetric history on file.      Home Medications    Prior to Admission medications   Medication Sig Start Date End Date Taking? Authorizing  Provider  baclofen (LIORESAL) 20 MG tablet Take by mouth. 10/08/18  Yes [provider]  clonazePAM (KLONOPIN) 0.5 MG tablet Take by mouth. 02/20/18  Yes [provider]  clotrimazole-betamethasone (LOTRISONE) cream Apply to affected area 2 times daily 01/04/20  Yes [provider]  cyanocobalamin 1000 MCG tablet Take by mouth. 05/24/19 05/23/20 Yes [provider]  ergocalciferol (VITAMIN D2) 1.25 MG (50000 UT) capsule Take 1 capsule by mouth 2 (two) times a week. 01/05/20  Yes [provider]  folic acid (FOLVITE) 1 MG tablet TAKE ONE TABLET (1 MG DOSE) BY MOUTH DAILY FOR 30 DAYS. 09/09/19  Yes [provider]  levothyroxine (SYNTHROID) 100 MCG tablet Take by mouth. 01/07/20 02/06/20 Yes [provider]  pravastatin (PRAVACHOL) 40 MG tablet Take by mouth. 12/13/19  Yes [provider]  albuterol (VENTOLIN HFA) 108 (90 Base) MCG/ACT inhaler INHALE TWO PUFFS EVERY FOUR TO SIX HOURS AS NEEDED. 09/30/19   [provider]  aspirin 81 MG chewable tablet Chew 162 mg by mouth daily.    [provider]  baclofen (LIORESAL) 20 MG tablet Take 20 mg by mouth 3 (three) times daily. 08/15/19   [provider]  benztropine (COGENTIN) 0.5 MG tablet Take 1 tablet (0.5 mg total) by mouth at bedtime. 10/21/19 10/20/20  Cleotis Nipper, MD  Butalbital-APAP-Caffeine [phone] MG capsule  Take by mouth. 10/28/19   [provider]  clonazePAM (KLONOPIN) 0.5 MG tablet Take 1 tablet (0.5 mg total) by mouth 3 (three) times daily as needed for anxiety. 10/21/19   Arfeen, Phillips Grout, MD  clozapine (CLOZARIL) 50 MG tablet Take one in morning, one in afternoon and two tab at bed time 10/21/19   Arfeen, Phillips Grout, MD  [facility] VITAMIN C 500 MG tablet Take 500 mg by mouth daily. 01/01/20   [provider]  diltiazem (DILACOR XR) 120 MG 24 hr capsule Take 120 mg by mouth daily. 11/26/16   [provider]  eletriptan (RELPAX) 40 MG tablet  TAKE 1 TAB AT ONSET OF HEADACHES. MAY REPEAT IN 2 HOURS.MAX 2/24 HRS. FOR 30 DAYS 07/22/16   [provider]  ferrous sulfate 325 (65 FE) MG tablet TAKE ONE TABLET (325 MG DOSE) BY MOUTH 2 (TWO) TIMES DAILY. 08/18/19   [provider]  Fluticasone-Umeclidin-Vilant (TRELEGY ELLIPTA) 200-62.5-25 MCG/INH AEPB Inhale into the lungs. 10/06/19   [provider]  folic acid (FOLVITE) 1 MG tablet Take 1 tablet (1 mg total) by mouth daily. 06/16/15   Pucilowska, Jolanta B, MD  hydrochlorothiazide (HYDRODIURIL) 12.5 MG tablet TAKE ONE TABLET (12.5 MG DOSE) BY MOUTH DAILY. TAKE ON TABLET BY MOUTH ONCE A DAY IN THE MORNING. 01/06/18   [provider]  lamoTRIgine (LAMICTAL) 100 MG tablet Take 100 mg by mouth 2 (two) times daily. 08/12/19   [provider]  levothyroxine (SYNTHROID, LEVOTHROID) 100 MCG tablet TAKE ONE TABLET (100 MCG DOSE) BY MOUTH DAILY. 09/01/17   [provider]  mirtazapine (REMERON) 45 MG tablet Take 1 tablet (45 mg total) by mouth at bedtime. 10/21/19   Arfeen, Phillips Grout, MD  omeprazole (PRILOSEC) 40 MG capsule  07/19/18   [provider]  phenazopyridine (PYRIDIUM) 100 MG tablet Take 100 mg by mouth 3 (three) times daily as needed. 09/26/19   [provider]  potassium chloride (KLOR-CON) 10 MEQ tablet Take 2 tablets by mouth daily. 09/27/19   [provider]  pravastatin (PRAVACHOL) 40 MG tablet Take by mouth. 09/29/19   [provider]  prazosin (MINIPRESS) 2 MG capsule Take 1 capsule (2 mg total) by mouth at bedtime. 10/21/19   Arfeen, Phillips Grout, MD  sulfamethoxazole-trimethoprim (BACTRIM DS) 800-160 MG tablet Take 1 tablet by mouth 2 (two) times daily. 09/26/19   [provider]  TROKENDI XR 100 MG CP24 Take 1 tablet by mouth daily. 08/16/17   [provider]  TROKENDI XR 200 MG CP24 Take 1 tablet by mouth daily. 08/16/17   [provider]  Vitamin D, Ergocalciferol, (DRISDOL) 50000 units CAPS  capsule Take 50,000 Units by mouth every 7 (seven) days. saturday    [provider]  zolpidem (AMBIEN) 5 MG tablet Take 1 tablet (5 mg total) by mouth at bedtime as needed for sleep. 03/22/11 07/29/11  Mechele Dawley, NP    Family History Family History  Adopted: Yes  Problem Relation Age of Onset  . Drug abuse Daughter        heroin addiction    Social History Social History   Tobacco Use  . Smoking status: Current Every Day Smoker    Packs/day: 1.00    Years: 30.00    Pack years: 30.00    Types: Cigarettes    Last attempt to quit: 09/20/2011    Years since quitting: 8.3  . Smokeless tobacco: Never Used  Vaping Use  . Vaping Use:  Never used  Substance Use Topics  . Alcohol use: No    Alcohol/week: 0.0 standard drinks  . Drug use: No     Allergies   Tomato, Imitrex [sumatriptan base], and Mustard seed   Review of Systems Review of Systems Pertinent negatives listed in HPI  Physical Exam Triage Vital Signs ED Triage Vitals  Enc Vitals Group     BP 01/19/20 1009 112/75     Pulse Rate 01/19/20 1009 90     Resp 01/19/20 1009 (!) 21     Temp 01/19/20 1009 98.4 F (36.9 C)     Temp Source 01/19/20 1009 Oral     SpO2 01/19/20 1009 93 %     Weight 01/19/20 1014 (!) 340 lb (154.2 kg)     Height 01/19/20 1014 5\' 11"  (1.803 m)     Head Circumference --      Peak Flow --      Pain Score 01/19/20 1013 10     Pain Loc --      Pain Edu? --      Excl. in GC? --    No data found.  Updated Vital Signs BP 112/75 (BP Location: Right Arm)   Pulse 90   Temp 98.4 F (36.9 C) (Oral)   Resp (!) 21   Ht 5\' 11"  (1.803 m)   Wt (!) 340 lb (154.2 kg)   SpO2 93%   BMI 47.42 kg/m   Visual Acuity Right Eye Distance:   Left Eye Distance:   Bilateral Distance:    Right Eye Near:   Left Eye Near:    Bilateral Near:      Patient is extremely anxious which has limited ability to completely examine during today's encounter. Physical Exam Constitutional:       Appearance: She is obese.     Comments: Patient is very anxious with pressured speech and groaning when attempting to exam   Cardiovascular:     Rate and Rhythm: Normal rate and regular rhythm.  Pulmonary:     Effort: Pulmonary effort is normal.     Breath sounds: Examination of the right-upper field reveals decreased breath sounds. Examination of the left-upper field reveals decreased breath sounds. Examination of the right-middle field reveals decreased breath sounds. Examination of the left-middle field reveals decreased breath sounds. Examination of the right-lower field reveals decreased breath sounds. Examination of the left-lower field reveals decreased breath sounds. Decreased breath sounds present.  Musculoskeletal:     Comments: Examined while patient is lying on exam table as she refused to sit up.  Patient endorses tenderness lower bilateral lumbar region Negative SLR (bilaterally)  Psychiatric:        Mood and Affect: Mood is anxious.        Speech: Speech is rapid and pressured.        Behavior: Behavior is uncooperative.       UC Treatments / Results  Labs (all labs ordered are listed, but only abnormal results are displayed) Labs Reviewed - No data to display  EKG   Radiology DG Lumbar Spine Complete  Result Date: 01/19/2020 CLINICAL DATA:  Low back pain after fall today. EXAM: LUMBAR SPINE - COMPLETE 4+ VIEW COMPARISON:  December 26, 2007. FINDINGS: No fracture or spondylolisthesis is noted. Moderate degenerative disc disease is noted at L5-S1. Remaining disc spaces are unremarkable. IMPRESSION: Moderate degenerative disc disease is noted at L5-S1. No acute abnormality seen in the lumbar spine. Electronically Signed   By: Lupita RaiderJames  Green Jr  M.D.   On: 01/19/2020 11:55    Procedures Procedures (including critical care time)  Medications Ordered in UC Medications - No data to display  Initial Impression / Assessment and Plan / UC Course  I have reviewed the triage  vital signs and the nursing notes.  Pertinent labs & imaging results that were available during my care of the patient were reviewed by me and considered in my medical decision making (see chart for details).    Toradol IM injection given here in clinic.  Patient tolerated.  Encouraged to take Tylenol 500 mg every 4-6 hours in addition to starting meloxicam 15 mg daily will give a low-dose of a muscle relaxer given. Opted against adding any additional pain related medications given patient is prescribed multiple sedative type medication and has associated mental illness. X-ray negative for fracture, revealed chronic arthritis. Advised if no improvement follow-up with [facility]  Final Clinical Impressions(s) / UC Diagnoses   Final diagnoses:  Injury of back, initial encounter  Lumbar and sacral arthritis     Discharge Instructions     X-ray shows no fracture only chronic arthritis of back.  Start Meloxicam 15 mg daily tomorrow.  Tizanidine 2 mg twice daily as needed for pain. You receive a injection of toradol for pain while you were here. You may also take tylenol 500 mg every 4-6 hours for pain. Follow-up with PCP if symptoms do not improve or [facility] if symptoms worsen.    ED Prescriptions    Medication Sig Dispense Auth. Provider   meloxicam (MOBIC) 15 MG tablet Take 1 tablet (15 mg total) by mouth daily. 30 tablet Bing Neighbors, FNP   tiZANidine (ZANAFLEX) 2 MG tablet Take 1 tablet (2 mg total) by mouth at bedtime. 20 tablet Bing Neighbors, FNP     PDMP not reviewed this encounter.   Bing Neighbors, FNP 01/25/20 2251

## 2020-01-19 NOTE — Discharge Instructions (Addendum)
X-ray shows no fracture only chronic arthritis of back.  Start Meloxicam 15 mg daily tomorrow.  Tizanidine 2 mg twice daily as needed for pain. You receive a injection of toradol for pain while you were here. You may also take tylenol 500 mg every 4-6 hours for pain. Follow-up with PCP if symptoms do not improve or [facility] if symptoms worsen.

## 2020-01-19 NOTE — ED Triage Notes (Signed)
Pt was cleaning her home and she tripped over a broom and fell on top of an end table. She point her pain as mid-lower back which radiates to R leg. The incident occurred late last night. She denies taking anything for pain. Her pain is 10/0-10 continuous and sharp. No other concerns.

## 2020-01-21 ENCOUNTER — Other Ambulatory Visit: Payer: Self-pay

## 2020-01-21 ENCOUNTER — Telehealth (INDEPENDENT_AMBULATORY_CARE_PROVIDER_SITE_OTHER): Payer: BC Managed Care – PPO | Admitting: Psychiatry

## 2020-01-21 ENCOUNTER — Encounter (HOSPITAL_COMMUNITY): Payer: Self-pay | Admitting: Psychiatry

## 2020-01-21 VITALS — Wt 340.0 lb

## 2020-01-21 DIAGNOSIS — F200 Paranoid schizophrenia: Secondary | ICD-10-CM | POA: Diagnosis not present

## 2020-01-21 DIAGNOSIS — F4310 Post-traumatic stress disorder, unspecified: Secondary | ICD-10-CM | POA: Diagnosis not present

## 2020-01-21 DIAGNOSIS — F419 Anxiety disorder, unspecified: Secondary | ICD-10-CM | POA: Diagnosis not present

## 2020-01-21 MED ORDER — CLONAZEPAM 0.5 MG PO TABS: 0.5000 mg | ORAL_TABLET | Freq: Three times a day (TID) | ORAL | 2 refills | Status: DC | PRN

## 2020-01-21 MED ORDER — BENZTROPINE MESYLATE 0.5 MG PO TABS: 0.5000 mg | ORAL_TABLET | Freq: Every day | ORAL | 2 refills | Status: DC

## 2020-01-21 MED ORDER — PRAZOSIN HCL 2 MG PO CAPS: 2.0000 mg | ORAL_CAPSULE | Freq: Every day | ORAL | 0 refills | Status: DC

## 2020-01-21 MED ORDER — MIRTAZAPINE 30 MG PO TABS: 30.0000 mg | ORAL_TABLET | Freq: Every day | ORAL | 0 refills | Status: DC

## 2020-01-21 MED ORDER — CLOZAPINE 50 MG PO TABS: ORAL_TABLET | ORAL | 2 refills | Status: DC

## 2020-01-21 NOTE — Progress Notes (Signed)
Virtual Visit via Telephone Note  I connected with Danielle [name] on 01/21/20 at  8:20 AM EDT by telephone and verified that I am speaking with the correct person using two identifiers.  Location: Patient: home Provider: home office   I discussed the limitations, risks, security and privacy concerns of performing an evaluation and management service by telephone and the availability of in person appointments. I also discussed with the patient that there may be a patient responsible charge related to this service. The patient expressed understanding and agreed to proceed.   History of Present Illness: Patient is evaluated by phone session.  On the last visit we cut down the clozapine dose and she noticed improvement in her energy level.  She still takes Minipress, mirtazapine, Cogentin along with other medicine for her general health including seizures.  I also spoke to her husband Danielle [name] who admitted that patient is doing better on the medication.  She denies any anger, agitation or any crying spells.  She still have nightmares and flashback and gets sometimes very anxious but there has been no worsening of the symptoms.  She is in therapy with Danielle [name].  Recently she hurt her back when she tripped as they are trying to renovate the house and seen in the urgent care and given meloxicam and muscle relaxant.  She is feeling better.  She is getting blood work and her WBC count is slightly high but stable.  She has no signs of infection.  Her tremors are much better since we started Cogentin.  She denies any suicidal thoughts.  Her energy level and sleep is improved from the past.   Past Psychiatric History:Reviewed. H/Omultiple hospitalizationdue tomultiple suicidal attempt, psychosis, paranoia, hallucination and severe depression. H/Odrowning herself.[facility].Tred multiple medications includesZoloft, amitriptyline, Wellbutrin, Latuda, Xanax,  Valium, Risperdal, lithium, Haldol, Depakote, Zyprexa, Geodon, imipramine,InVega, temazepamand Paxil, lithium, loxapineand lamictal.Referred AACTteambutnot approved.  Recent Results (from the past 2160 hour(s))  CBC with Differential     Status: Abnormal   Collection Time: 11/03/19  8:25 AM  Result Value Ref Range   WBC 8.7 3.8 - 10.8 Thousand/uL   RBC 4.81 3.80 - 5.10 Million/uL   Hemoglobin 15.6 (H) 11.7 - 15.5 g/dL   HCT 76.1 (H) 35 - 45 %   MCV 94.8 80.0 - 100.0 fL   MCH 32.4 27.0 - 33.0 pg   MCHC 34.2 32.0 - 36.0 g/dL   RDW 95.0 93.2 - 67.1 %   Platelets 271 140 - 400 Thousand/uL   MPV 11.2 7.5 - 12.5 fL   Neutro Abs 5,638 1,500 - 7,800 cells/uL   Lymphs Abs 2,297 850 - 3,900 cells/uL   Absolute Monocytes 548 200 - 950 cells/uL   Eosinophils Absolute 139 15 - 500 cells/uL   Basophils Absolute 78 0 - 200 cells/uL   Neutrophils Relative % 64.8 %   Total Lymphocyte 26.4 %   Monocytes Relative 6.3 %   Eosinophils Relative 1.6 %   Basophils Relative 0.9 %  Clozapine (clozaril)     Status: None   Collection Time: 11/03/19  8:25 AM  Result Value Ref Range   NorClozapine 234 25 - 400 mcg/L   Clozapine Lvl 438 mcg/L    Comment: . The therapeutic response begins to appear at 100 mcg/L. Refractory schizophrenia appears to require a therapeutic concentration of at least 350 mcg/L (trough, at steady state). . Toxic range: Greater than 900 mcg/L . This test was developed and its analytical  performance characteristics have been determined by [facility] [place], [place]. It has not been cleared or approved by the U.S. Food and Drug Administration. This assay has been validated pursuant to the CLIA regulations and is used for clinical purposes. Marland Kitchen   CBC with Differential     Status: Abnormal   Collection Time: 12/03/19 11:49 AM  Result Value Ref Range   WBC 12.0 (H) 3.8 - 10.8 Thousand/uL   RBC 4.39 3.80 - 5.10 Million/uL   Hemoglobin 14.3 11.7 -  15.5 g/dL   HCT 77.8 35 - 45 %   MCV 95.2 80.0 - 100.0 fL   MCH 32.6 27.0 - 33.0 pg   MCHC 34.2 32.0 - 36.0 g/dL   RDW 24.2 35.3 - 61.4 %   Platelets 290 140 - 400 Thousand/uL   MPV 10.4 7.5 - 12.5 fL   Neutro Abs 9,636 (H) 1,500 - 7,800 cells/uL   Lymphs Abs 1,752 850 - 3,900 cells/uL   Absolute Monocytes 492 200 - 950 cells/uL   Eosinophils Absolute 36 15 - 500 cells/uL   Basophils Absolute 84 0 - 200 cells/uL   Neutrophils Relative % 80.3 %   Total Lymphocyte 14.6 %   Monocytes Relative 4.1 %   Eosinophils Relative 0.3 %   Basophils Relative 0.7 %  Clozapine (clozaril)     Status: None   Collection Time: 12/03/19 11:49 AM  Result Value Ref Range   NorClozapine 181 25 - 400 mcg/L   Clozapine Lvl 306 mcg/L    Comment: . The therapeutic response begins to appear at 100 mcg/L. Refractory schizophrenia appears to require a therapeutic concentration of at least 350 mcg/L (trough, at steady state). . Toxic range: Greater than 900 mcg/L . This test was developed and its analytical performance characteristics have been determined by [facility] [place], [place]. It has not been cleared or approved by the U.S. Food and Drug Administration. This assay has been validated pursuant to the CLIA regulations and is used for clinical purposes. Marland Kitchen      Psychiatric Specialty Exam: Physical Exam  Review of Systems  There were no vitals taken for this visit.There is no height or weight on file to calculate BMI.  General Appearance: NA  Eye Contact:  NA  Speech:  Slow  Volume:  at times rambling  Mood:  Anxious  Affect:  Labile  Thought Process:  Descriptions of Associations: Circumstantial  Orientation:  Full (Time, Place, and Person)  Thought Content:  Rumination  Suicidal Thoughts:  No  Homicidal Thoughts:  No  Memory:  Immediate;   Fair Recent;   Fair Remote;   Fair  Judgement:  Intact  Insight:  Shallow  Psychomotor Activity:  NA  Concentration:   Concentration: Fair and Attention Span: Fair  Recall:  [employer] of Knowledge:  Fair  Language:  Fair  Akathisia:  NA  Handed:  Right  AIMS (if indicated):     Assets:  Communication Skills Desire for Improvement Housing Social Support  ADL's:  Intact  Cognition:  WNL  Sleep:   better      Assessment and Plan: Schizophrenia chronic paranoid type.  PTSD.  Anxiety.  Discussed to cut down further her medication.  Patient agreed with the plan.  We will cut down her mirtazapine from 45 mg to 30 mg only.  She is reluctant to cut down her Klonopin and we will keep the Klonopin 0.5 mg 3 times a day, continue clozapine 50 mg  in the morning, 50 mg in the afternoon and 100 mg at bedtime, Minipress 2 mg at bedtime and Cogentin 0.5 mg at bedtime.  She is taking baclofen, Lamictal from other provider.  I reviewed blood work results.  Encouraged to continue therapy with Danielle [name].  Recommended to call us back if she has any question or any concern.  In the future if she remains stable we will consider cut down her clozapine.  Treatment plan discussed with patient's husband.  Follow-up in 3 months.    Follow Up Instructions:    I discussed the assessment and treatment plan with the patient. The patient was provided an opportunity to ask questions and all were answered. The patient agreed with the plan and demonstrated an understanding of the instructions.   The patient was advised to call back or seek an in-person evaluation if the symptoms worsen or if the condition fails to improve as anticipated.  I provided 30 minutes of non-face-to-face time during this encounter.   Cleotis Nipper, MD

## 2020-01-23 ENCOUNTER — Encounter (HOSPITAL_COMMUNITY): Payer: Self-pay | Admitting: Licensed Clinical Social Worker

## 2020-01-23 NOTE — Progress Notes (Signed)
Virtual Visit via Video Note  I connected with Danielle [name] on 01/23/20 at  8:00 AM EDT by a video enabled telemedicine application and verified that I am speaking with the correct person using two identifiers.  Location: Patient: home Provider: office   I discussed the limitations of evaluation and management by telemedicine and the availability of in person appointments. The patient expressed understanding and agreed to proceed.  Type of Therapy: Individual Therapy   Treatment Goals addressed: "to deal with my depression and severe anxiety". Danielle [name] will report improvement in depressed mood and anxiety/panic attacks 3 out of 7 days.    Interventions: Motivational Interviewing   Summary: Danielle [name] is a 49 y.o. female who presents with Grief, Schizophrenia and PTSD.   Suicidal/Homicidal: No - without intent/plan   Therapist Response:   Kris met with clinician for an individual session.  Danielle [name] discussed her psychiatric symptoms, her current life events. Danielle [name] reports that she is doing better and has not had any severe sxs since previous session. She reports that work has begun on her home and she has been feeling overwhelmed by the workers in the house, the noise levels, and the changes in her surroundings. Clinician explored thoughts and feelings about the challenges of living in that environment, but also noted the excitement of having the work done and the house being updated. Clinician discussed recent interactions with son and daughter. Clinician identified some improvement on surface level, but noted ongoing sense of walking on eggshells.    Plan: Return again in 2 weeks.   Diagnosis: Axis I: Grief, Schizophrenia, paranoid type and PTSD           I discussed the assessment and treatment plan with the patient. The patient was provided an opportunity to ask questions and all were answered. The patient agreed with the plan and demonstrated an understanding of the instructions.    The patient was advised to call back or seek an in-person evaluation if the symptoms worsen or if the condition fails to improve as anticipated.  I provided 45 minutes of non-face-to-face time during this encounter.   Danielle Curling, LCSW

## 2020-02-02 ENCOUNTER — Ambulatory Visit (HOSPITAL_COMMUNITY): Payer: BC Managed Care – PPO | Admitting: Licensed Clinical Social Worker

## 2020-02-21 ENCOUNTER — Other Ambulatory Visit (HOSPITAL_COMMUNITY): Payer: Self-pay

## 2020-02-21 DIAGNOSIS — Z79899 Other long term (current) drug therapy: Secondary | ICD-10-CM

## 2020-02-22 ENCOUNTER — Other Ambulatory Visit: Payer: Self-pay

## 2020-02-22 ENCOUNTER — Ambulatory Visit (INDEPENDENT_AMBULATORY_CARE_PROVIDER_SITE_OTHER): Payer: BC Managed Care – PPO | Admitting: Licensed Clinical Social Worker

## 2020-02-22 DIAGNOSIS — F200 Paranoid schizophrenia: Secondary | ICD-10-CM

## 2020-02-23 ENCOUNTER — Encounter (HOSPITAL_COMMUNITY): Payer: Self-pay | Admitting: Licensed Clinical Social Worker

## 2020-02-23 NOTE — Progress Notes (Signed)
Virtual Visit via Video Note  I connected with Danielle [name] on 02/23/20 at  8:00 AM EDT by a video enabled telemedicine application and verified that I am speaking with the correct person using two identifiers.  Location: Patient: home Provider: office   I discussed the limitations of evaluation and management by telemedicine and the availability of in person appointments. The patient expressed understanding and agreed to proceed.  Type of Therapy: Family Therapy   Treatment Goals addressed: "to deal with my depression and severe anxiety". Danielle [name] will report improvement in depressed mood and anxiety/panic attacks 3 out of 7 days.    Interventions: Motivational Interviewing   Summary: Danielle [name] is a 49 y.o. female who presents with Grief, Schizophrenia and PTSD.   Suicidal/Homicidal: No - without intent/plan   Therapist Response:   Danielle [name] and husband, Danielle [name], met with clinician for a family session. Danielle [name] provided updates on the household, family updates, and his view of Danielle [name]'s progress. Danielle [name] identified that Danielle [name] had been hospitalized for about 9 days due to pneumonia, but reported she is feeling better now. Clinician explored his view of her mental status. Danielle [name] reports she has been stressed, and had a tough day on the anniversary of her dad's death.  Danielle [name] discussed her psychiatric symptoms, her current life events. Danielle [name] reports that she is doing better now that she is healthy again. Clinician processed concerns about Danielle [name]'s mother and family. Clinician discussed options for clearing the air with the family about her previous statements about moving in with them. Clinician processed ways to communicate clearly about her availability and willingness to be supportive to her family.    Plan: Return again in 2 weeks.   Diagnosis: Axis I: Grief, Schizophrenia, paranoid type and PTSD           I discussed the assessment and treatment plan with the patient. The patient was  provided an opportunity to ask questions and all were answered. The patient agreed with the plan and demonstrated an understanding of the instructions.   The patient was advised to call back or seek an in-person evaluation if the symptoms worsen or if the condition fails to improve as anticipated.  I provided 45 minutes of non-face-to-face time during this encounter.   [name], LCSW

## 2020-03-09 ENCOUNTER — Other Ambulatory Visit: Payer: Self-pay | Admitting: Family Medicine

## 2020-03-09 NOTE — Telephone Encounter (Signed)
  Notes to clinic Not a PCP we approve rx for.

## 2020-03-13 ENCOUNTER — Other Ambulatory Visit (HOSPITAL_COMMUNITY): Payer: Self-pay

## 2020-03-13 DIAGNOSIS — Z79899 Other long term (current) drug therapy: Secondary | ICD-10-CM

## 2020-03-13 NOTE — Progress Notes (Signed)
Patient called and stated that she went to try and get labs done on Friday. I spoke with Alla German at [place]. He stated that he didn't see the standing orders I put in on 10/18 and patient stated that the lab didn't either. I resubmitted the labs today while I had [facility] on the phone and he stated that he saw them in the system so the patient can go back without a problem and complete them

## 2020-03-21 LAB — CBC WITH DIFFERENTIAL/PLATELET
Absolute Monocytes: 573 cells/uL (ref 200–950)
Basophils Absolute: 73 cells/uL (ref 0–200)
Basophils Relative: 0.8 %
Eosinophils Absolute: 82 cells/uL (ref 15–500)
Eosinophils Relative: 0.9 %
HCT: 43.4 % (ref 35.0–45.0)
Hemoglobin: 14.7 g/dL (ref 11.7–15.5)
Lymphs Abs: 1893 cells/uL (ref 850–3900)
MCH: 32.5 pg (ref 27.0–33.0)
MCHC: 33.9 g/dL (ref 32.0–36.0)
MCV: 95.8 fL (ref 80.0–100.0)
MPV: 10.4 fL (ref 7.5–12.5)
Monocytes Relative: 6.3 %
Neutro Abs: 6479 cells/uL (ref 1500–7800)
Neutrophils Relative %: 71.2 %
Platelets: 296 10*3/uL (ref 140–400)
RBC: 4.53 10*6/uL (ref 3.80–5.10)
RDW: 14.4 % (ref 11.0–15.0)
Total Lymphocyte: 20.8 %
WBC: 9.1 10*3/uL (ref 3.8–10.8)

## 2020-03-21 LAB — CLOZAPINE (CLOZARIL)
Clozapine Lvl: 281 mcg/L
NorClozapine: 162 mcg/L (ref 25–400)

## 2020-03-22 ENCOUNTER — Telehealth (HOSPITAL_COMMUNITY): Payer: Self-pay

## 2020-03-22 NOTE — Telephone Encounter (Signed)
Thanks! Reviewed :)

## 2020-03-22 NOTE — Telephone Encounter (Signed)
Lab results came in for [facility]

## 2020-04-03 ENCOUNTER — Other Ambulatory Visit (HOSPITAL_COMMUNITY): Payer: Self-pay | Admitting: Psychiatry

## 2020-04-03 DIAGNOSIS — F4310 Post-traumatic stress disorder, unspecified: Secondary | ICD-10-CM

## 2020-04-03 DIAGNOSIS — F200 Paranoid schizophrenia: Secondary | ICD-10-CM

## 2020-04-05 ENCOUNTER — Other Ambulatory Visit (HOSPITAL_COMMUNITY): Payer: Self-pay | Admitting: *Deleted

## 2020-04-05 ENCOUNTER — Other Ambulatory Visit: Payer: Self-pay

## 2020-04-05 ENCOUNTER — Ambulatory Visit (INDEPENDENT_AMBULATORY_CARE_PROVIDER_SITE_OTHER): Payer: BC Managed Care – PPO | Admitting: Licensed Clinical Social Worker

## 2020-04-05 ENCOUNTER — Encounter (HOSPITAL_COMMUNITY): Payer: Self-pay | Admitting: Licensed Clinical Social Worker

## 2020-04-05 DIAGNOSIS — F200 Paranoid schizophrenia: Secondary | ICD-10-CM

## 2020-04-05 DIAGNOSIS — F4321 Adjustment disorder with depressed mood: Secondary | ICD-10-CM

## 2020-04-05 DIAGNOSIS — Z79899 Other long term (current) drug therapy: Secondary | ICD-10-CM

## 2020-04-05 DIAGNOSIS — F419 Anxiety disorder, unspecified: Secondary | ICD-10-CM | POA: Diagnosis not present

## 2020-04-05 NOTE — Progress Notes (Signed)
**Note De-[name] via Obfuscation** Virtual Visit via Telephone Note  I connected with Danielle [name] on 04/05/20 at  8:00 AM EST by telephone and verified that I am speaking with the correct person using two identifiers.  Location: Patient: home Provider: home office   I discussed the limitations, risks, security and privacy concerns of performing an evaluation and management service by telephone and the availability of in person appointments. I also discussed with the patient that there may be a patient responsible charge related to this service. The patient expressed understanding and agreed to proceed.  Type of Therapy: Individual Therapy   Treatment Goals addressed: "to deal with my depression and severe anxiety". Danielle [name] will report improvement in depressed mood and anxiety/panic attacks 3 out of 7 days.    Interventions: Motivational Interviewing   Summary: Danielle [name] is a 49 y.o. female who presents with Grief, Schizophrenia and PTSD.   Suicidal/Homicidal: No - without intent/plan   Therapist Response:   Danielle [name] met with clinician for an individual session. Danielle [name] reported that she had been hospitalized for about 9 days due to pneumonia, but reported she is feeling better now. Clinician explored her view of her mental status. Danielle [name] discussed her psychiatric symptoms, her current life events. Danielle [name] reports that she is feeling okay, although a few days ago, she had to go to the hospital following a fall where she hit her head on the bathtub. Clinician explored updates in her pain and progress toward healing. Clinician utilized MI OARS to reflect and summarize thoughts and feelings about changes in the household. Danielle [name] reports that her sister-in-law, Mother-in-law, and niece have moved into her home following a lot of strife at their previous residence. Clinician explored thoughts and feelings about having people in the house all the time. Danielle [name] that this is positive, as she can spend time in her room when she  wants to be alone, but she can go out to the living room whenever she wants to have company. Clinician reflected the positive aspects of people being home with Danielle [name], especially related to her health. Clinician discussed the importance of communicating with family members what to do if Danielle [name] or has a seizure.  Danielle [name] discussed interest in applying for SSD. Clinician forwarded information from social security website in order for her and husband to review.    Plan: Return again in 2 weeks.   Diagnosis: Axis I: Grief, Schizophrenia, paranoid type and PTSD            I discussed the assessment and treatment plan with the patient. The patient was provided an opportunity to ask questions and all were answered. The patient agreed with the plan and demonstrated an understanding of the instructions.   The patient was advised to call back or seek an in-person evaluation if the symptoms worsen or if the condition fails to improve as anticipated.  I provided 45 minutes of non-face-to-face time during this encounter.   [name], Danielle

## 2020-04-17 ENCOUNTER — Other Ambulatory Visit (HOSPITAL_COMMUNITY): Payer: Self-pay | Admitting: *Deleted

## 2020-04-17 DIAGNOSIS — Z79899 Other long term (current) drug therapy: Secondary | ICD-10-CM

## 2020-04-18 ENCOUNTER — Telehealth (HOSPITAL_COMMUNITY): Payer: Self-pay | Admitting: *Deleted

## 2020-04-18 NOTE — Telephone Encounter (Signed)
Pt husband, [name], called regarding pt lab orders. Writer placed standing orders for Cbc w/Diff and Clozaril level with [facility] in [place].

## 2020-04-18 NOTE — Telephone Encounter (Signed)
ok 

## 2020-04-19 ENCOUNTER — Other Ambulatory Visit: Payer: Self-pay

## 2020-04-19 ENCOUNTER — Ambulatory Visit (INDEPENDENT_AMBULATORY_CARE_PROVIDER_SITE_OTHER): Payer: BC Managed Care – PPO | Admitting: Licensed Clinical Social Worker

## 2020-04-19 DIAGNOSIS — F4310 Post-traumatic stress disorder, unspecified: Secondary | ICD-10-CM

## 2020-04-19 DIAGNOSIS — F200 Paranoid schizophrenia: Secondary | ICD-10-CM | POA: Diagnosis not present

## 2020-04-20 ENCOUNTER — Encounter (HOSPITAL_COMMUNITY): Payer: Self-pay | Admitting: Licensed Clinical Social Worker

## 2020-04-20 NOTE — Progress Notes (Signed)
Virtual Visit via Video Note  I connected with [place] on 04/20/20 at  8:00 AM EST by a video enabled telemedicine application and verified that I am speaking with the correct person using two identifiers.  Location: Patient: home Provider: home office   I discussed the limitations of evaluation and management by telemedicine and the availability of in person appointments. The patient expressed understanding and agreed to proceed.   Type of Therapy: Individual Therapy   Treatment Goals addressed: "to deal with my depression and severe anxiety". Danielle [name] will report improvement in depressed mood and anxiety/panic attacks 3 out of 7 days.    Interventions: Motivational Interviewing   Summary: Danielle [name] is a 49 y.o. female who presents with Schizophrenia and PTSD.   Suicidal/Homicidal: No - without intent/plan   Therapist Response:   Paul met with clinician for an individual session. Clinician explored her view of her mental status. Danielle [name] discussed her psychiatric symptoms, her current life events. Danielle [name] reports that she has been doing okay. She reported some anxiety and panic attacks, but reduction in hallucinations. Clinician processed interactions with in-laws who are living with them now using MI OARS. Clinician reflected that it is nice to have people in the house to talk to. However, she reports that she feels a little overcrowded, and tends to stay in her room a lot. Clinician explored whether that is increasing any depression, but Danielle [name] denied this. Clinician discussed coping skills for anxiety and reviewed positive self talk, deep breathing, drinking cold water, and talking to her family. Danielle [name] denied understanding of specific triggers.    Plan: Return again in 2 weeks.   Diagnosis: Axis I:  Schizophrenia, paranoid type and PTSD         I discussed the assessment and treatment plan with the patient. The patient was provided an opportunity to ask questions and all were  answered. The patient agreed with the plan and demonstrated an understanding of the instructions.   The patient was advised to call back or seek an in-person evaluation if the symptoms worsen or if the condition fails to improve as anticipated.  I provided 40 minutes of non-face-to-face time during this encounter.   [name], LCSW

## 2020-04-21 ENCOUNTER — Other Ambulatory Visit: Payer: Self-pay

## 2020-04-21 ENCOUNTER — Encounter (HOSPITAL_COMMUNITY): Payer: Self-pay | Admitting: Psychiatry

## 2020-04-21 ENCOUNTER — Telehealth (INDEPENDENT_AMBULATORY_CARE_PROVIDER_SITE_OTHER): Payer: BC Managed Care – PPO | Admitting: Psychiatry

## 2020-04-21 DIAGNOSIS — F419 Anxiety disorder, unspecified: Secondary | ICD-10-CM

## 2020-04-21 DIAGNOSIS — F200 Paranoid schizophrenia: Secondary | ICD-10-CM

## 2020-04-21 DIAGNOSIS — F4310 Post-traumatic stress disorder, unspecified: Secondary | ICD-10-CM

## 2020-04-21 MED ORDER — BENZTROPINE MESYLATE 0.5 MG PO TABS: 0.5000 mg | ORAL_TABLET | Freq: Every day | ORAL | 2 refills | Status: DC

## 2020-04-21 MED ORDER — CLOZAPINE 50 MG PO TABS: ORAL_TABLET | ORAL | 2 refills | Status: DC

## 2020-04-21 MED ORDER — PRAZOSIN HCL 2 MG PO CAPS: 2.0000 mg | ORAL_CAPSULE | Freq: Every day | ORAL | 0 refills | Status: DC

## 2020-04-21 MED ORDER — MIRTAZAPINE 30 MG PO TABS
30.0000 mg | ORAL_TABLET | Freq: Every day | ORAL | 0 refills | Status: DC
Start: 2020-04-21 — End: 2020-07-20

## 2020-04-21 MED ORDER — CLONAZEPAM 0.5 MG PO TABS: 0.5000 mg | ORAL_TABLET | Freq: Three times a day (TID) | ORAL | 2 refills | Status: DC | PRN

## 2020-04-21 NOTE — Progress Notes (Signed)
Virtual Visit via Telephone Note  I connected with Danielle [name] on 04/21/20 at  8:20 AM EST by telephone and verified that I am speaking with the correct person using two identifiers.  Location: Patient: Home Provider: Home Office   I discussed the limitations, risks, security and privacy concerns of performing an evaluation and management service by telephone and the availability of in person appointments. I also discussed with the patient that there may be a patient responsible charge related to this service. The patient expressed understanding and agreed to proceed.   History of Present Illness: Patient is evaluated by phone session.  She reported increased anxiety recently because of husband's mother, sister and 2 nieces are now living with dad.  I also spoke with her husband [name] who is involved in the treatment plan.  Patient reported that she has 1 panic attack but it was not intense.  She is sleeping at least 7 hours.  She is concerned about her general health and recently she was admitted in the hospital for pneumonia.  She is feeling better.  She had lost weight and she feels that helps her energy level.  She is compliant with medication but reported financial stress because more people living in the house and sometimes very do not have enough money.  She is in therapy with [name].  She has residual paranoia but denies any hallucination, suicidal thoughts, anger, crying spells.  She has no agitation.  As per husband patient is a stable on the medication.  She has no tremors shakes but occasionally headaches and she see her primary care physician for general health needs.  Her last WBC count was 10.1 which was done on November 28 when she was admitted to hospital for pneumonia.  She is compliant with clozapine.  Patient and her husband feel current medicine is working but occasionally anxiety.  In the past we have tried higher dose of mirtazapine but it made her very tired and decreased  energy.     Past Psychiatric History:Reviewed. H/Omultiple hospitalizationdue tomultiple suicidal attempt, psychosis, paranoia, hallucination and severe depression. H/Odrowning herself.Last admission inOctober 2019 at [facility].Tred multiple medications includesZoloft, amitriptyline, Wellbutrin, Latuda, Xanax, Valium, Risperdal, lithium, Haldol, Depakote, Zyprexa, Geodon, imipramine,InVega, temazepamand Paxil, lithium, loxapineand lamictal.Referred AACTteambutnot approved.  Recent Results (from the past 2160 hour(s))  Clozapine (clozaril)     Status: None   Collection Time: 03/16/20 10:23 AM  Result Value Ref Range   NorClozapine 162 25 - 400 mcg/L   Clozapine Lvl 281 mcg/L    Comment: . The therapeutic response begins to appear at 100 mcg/L. Refractory schizophrenia appears to require a therapeutic concentration of at least 350 mcg/L (trough, at steady state). . Toxic range: Greater than 900 mcg/L . This test was developed and its analytical performance characteristics have been determined by [facility] [place], [place]. It has not been cleared or approved by the U.S. Food and Drug Administration. This assay has been validated pursuant to the CLIA regulations and is used for clinical purposes. .   CBC With Differential/Platelet     Status: None   Collection Time: 03/16/20 10:23 AM  Result Value Ref Range   WBC 9.1 3.8 - 10.8 Thousand/uL   RBC 4.53 3.80 - 5.10 Million/uL   Hemoglobin 14.7 11.7 - 15.5 g/dL   HCT 75.6 43.3 - 29.5 %   MCV 95.8 80.0 - 100.0 fL   MCH 32.5 27.0 - 33.0 pg   MCHC 33.9 32.0 - 36.0  g/dL   RDW 86.7 61.9 - 50.9 %   Platelets 296 140 - 400 Thousand/uL   MPV 10.4 7.5 - 12.5 fL   Neutro Abs 6,479 1,500 - 7,800 cells/uL   Lymphs Abs 1,893 850 - 3,900 cells/uL   Absolute Monocytes 573 200 - 950 cells/uL   Eosinophils Absolute 82 15 - 500 cells/uL   Basophils Absolute 73 0 - 200 cells/uL    Neutrophils Relative % 71.2 %   Total Lymphocyte 20.8 %   Monocytes Relative 6.3 %   Eosinophils Relative 0.9 %   Basophils Relative 0.8 %    Psychiatric Specialty Exam: Physical Exam  Review of Systems  Weight 253 lb (114.8 kg).There is no height or weight on file to calculate BMI.  General Appearance: NA  Eye Contact:  NA  Speech:  Slow  Volume:  Decreased  Mood:  Anxious  Affect:  NA  Thought Process:  Descriptions of Associations: Intact  Orientation:  Full (Time, Place, and Person)  Thought Content:  Paranoid Ideation and Rumination  Suicidal Thoughts:  No  Homicidal Thoughts:  No  Memory:  Immediate;   Fair Recent;   Fair Remote;   Fair  Judgement:  Fair  Insight:  Shallow  Psychomotor Activity:  NA  Concentration:  Concentration: Fair and Attention Span: Fair  Recall:  [employer] of Knowledge:  Fair  Language:  Fair  Akathisia:  No  Handed:  Right  AIMS (if indicated):     Assets:  Communication Skills Desire for Improvement Housing Social Support  ADL's:  Intact  Cognition:  WNL  Sleep:   7 hrs      Assessment and Plan: Schizophrenia chronic paranoid type.  PTSD.  Anxiety.  Discussed increase anxiety due to more people living in the house.  She is not sure when husband's mother sister and 2 nieces will move out.  We discussed medication and I am reluctant to add more medication because in the past she had complained of feeling tired, dizziness.  I reviewed blood work results from [facility] where she was admitted for pneumonia.  Her last WBC count is 10.1 on November 28.  Creatinine 0.74 and BUN 18.  She had lost significant weight.  She has abnormal lipid panel and now she is taking Lovaza.  We agree not to change the medication since patient is a stable.  I encouraged to increase therapy appointment with [name] to address the anxiety.  Continue Minipress 2 mg at bedtime, Cogentin 0.5 mg at bedtime, clozapine 50 mg in the morning, 50 in the afternoon  and 100 mg at bedtime and Klonopin 0.5 mg 3 times a day.  She is also taking Lamictal and muscle relaxer from other provider.  We discussed polypharmacy.  I recommend to call us back if she has any question or any concern.  We will consider reducing the medication in the future when her stress level is down.  Recommended sleep hygiene.  Recommended regular labs for clozapine.  Follow-up in 3 months.  Follow Up Instructions:    I discussed the assessment and treatment plan with the patient. The patient was provided an opportunity to ask questions and all were answered. The patient agreed with the plan and demonstrated an understanding of the instructions.   The patient was advised to call back or seek an in-person evaluation if the symptoms worsen or if the condition fails to improve as anticipated.  I provided 28 minutes of non-face-to-face time during this encounter.  Kathlee Nations, MD

## 2020-04-26 ENCOUNTER — Telehealth (HOSPITAL_COMMUNITY): Payer: Self-pay | Admitting: *Deleted

## 2020-04-26 NOTE — Telephone Encounter (Signed)
Nurse called pt to advise that lab orders have been faxed to [facility] twice and we are awaiting results.VM left for pt.

## 2020-05-10 ENCOUNTER — Ambulatory Visit (INDEPENDENT_AMBULATORY_CARE_PROVIDER_SITE_OTHER): Payer: BC Managed Care – PPO | Admitting: Licensed Clinical Social Worker

## 2020-05-10 ENCOUNTER — Other Ambulatory Visit: Payer: Self-pay

## 2020-05-10 DIAGNOSIS — F4310 Post-traumatic stress disorder, unspecified: Secondary | ICD-10-CM

## 2020-05-10 DIAGNOSIS — F419 Anxiety disorder, unspecified: Secondary | ICD-10-CM

## 2020-05-10 DIAGNOSIS — F200 Paranoid schizophrenia: Secondary | ICD-10-CM

## 2020-05-14 ENCOUNTER — Encounter (HOSPITAL_COMMUNITY): Payer: Self-pay | Admitting: Licensed Clinical Social Worker

## 2020-05-14 NOTE — Progress Notes (Signed)
Virtual Visit via Video Note  I connected with [place] on 05/14/20 at  8:00 AM EST by a video enabled telemedicine application and verified that I am speaking with the correct person using two identifiers.  Location: Patient: home Provider: office   I discussed the limitations of evaluation and management by telemedicine and the availability of in person appointments. The patient expressed understanding and agreed to proceed.  Type of Therapy: Individual Therapy   Treatment Goals addressed: "to deal with my depression and severe anxiety". Annalisa will report improvement in depressed mood and anxiety/panic attacks 3 out of 7 days.    Interventions: Motivational Interviewing   Summary: Danielle [name] is a 50 y.o. female who presents with Schizophrenia and PTSD.   Suicidal/Homicidal: No - without intent/plan   Therapist Response:   Livy met with clinician for an individual session. Clinician explored her view of her mental status. Thanya discussed her psychiatric symptoms, her current life events. Isabeau reports that she continues to struggle with panic attacks, but she reports she is unable to identify triggers. Clinician utilized MI OARS to reflect and summarize thoughts and feelings associated with in-laws in the household. Amarria shared concerns about their approach to parenting their autistic adult child. Clinician explored Jeanni's ability to communicate this with her sister-in-law. Jamell shared that the does not want to overstep her boundaries, but she does hide out in her room a lot. Clinician explored the progress of the family getting their own place, but no moves have been made. Clinician discussed coping skills for managing anxiety, including finding a quiet place and breathing, thinking about happy thoughts, and remembering that this will be over soon, positive self talk.    Plan: Return again in 2 weeks.   Diagnosis: Axis I:  Schizophrenia, paranoid type and PTSD           I  discussed the assessment and treatment plan with the patient. The patient was provided an opportunity to ask questions and all were answered. The patient agreed with the plan and demonstrated an understanding of the instructions.   The patient was advised to call back or seek an in-person evaluation if the symptoms worsen or if the condition fails to improve as anticipated.  I provided 45 minutes of non-face-to-face time during this encounter.   [name], LCSW

## 2020-05-31 ENCOUNTER — Ambulatory Visit (INDEPENDENT_AMBULATORY_CARE_PROVIDER_SITE_OTHER): Payer: BC Managed Care – PPO | Admitting: Licensed Clinical Social Worker

## 2020-05-31 ENCOUNTER — Encounter (HOSPITAL_COMMUNITY): Payer: Self-pay | Admitting: Licensed Clinical Social Worker

## 2020-05-31 ENCOUNTER — Other Ambulatory Visit: Payer: Self-pay

## 2020-05-31 DIAGNOSIS — F419 Anxiety disorder, unspecified: Secondary | ICD-10-CM

## 2020-05-31 DIAGNOSIS — F200 Paranoid schizophrenia: Secondary | ICD-10-CM

## 2020-05-31 DIAGNOSIS — F4310 Post-traumatic stress disorder, unspecified: Secondary | ICD-10-CM

## 2020-05-31 NOTE — Progress Notes (Signed)
Virtual Visit via Video Note  I connected with Danielle [name] on 05/31/20 at  8:00 AM EST by a video enabled telemedicine application and verified that I am speaking with the correct person using two identifiers.  Location: Patient: home Provider: home office   I discussed the limitations of evaluation and management by telemedicine and the availability of in person appointments. The patient expressed understanding and agreed to proceed.  Type of Therapy: Individual Therapy   Treatment Goals addressed: "to deal with my depression and severe anxiety". Danielle [name] will report improvement in depressed mood and anxiety/panic attacks 3 out of 7 days.    Interventions: CBT   Summary: Danielle [name] is a 50 y.o. female who presents with Schizophrenia and PTSD.   Suicidal/Homicidal: No - without intent/plan   Therapist Response:   Danielle [name] met with clinician for an individual session. Clinician explored her view of her mental status. Danielle [name] discussed her psychiatric symptoms, her current life events. Danielle [name] reports that she continues to feel a low level of anxiety most of the day, every day. She also reports having an increase in seizure activity, about 5 per week, which is more that typical. Clinician utilized CBT to explore possible triggers to increased seizures, such as increased stress levels. Clinician explored reasons why stress is increased, noting that inlaws are still living in the home, and [name] no longer has anywhere to escape home. Danielle [name] reported that when she felt frustrated or anxious at home, she would go to her inlaws. However, now she rarely leaves the house. Clinician explored ways to get out a little, by taking a walk outside, going for a drive, going to the store, etc. Danielle [name] identified that weather has been prohibitive for outdoor exercise.  Clinician completed PHQ 2 & 9, and Danielle [name].  Plan: Return again in 2 weeks.   Diagnosis: Axis I:  Schizophrenia, paranoid type and PTSD            I discussed the assessment and treatment plan with the patient. The patient was provided an opportunity to ask questions and all were answered. The patient agreed with the plan and demonstrated an understanding of the instructions.   The patient was advised to call back or seek an in-person evaluation if the symptoms worsen or if the condition fails to improve as anticipated.  I provided 40 minutes of non-face-to-face time during this encounter.   [name], LCSW

## 2020-06-05 ENCOUNTER — Other Ambulatory Visit (HOSPITAL_COMMUNITY): Payer: Self-pay | Admitting: Psychiatry

## 2020-06-09 LAB — CLOZAPINE (CLOZARIL)
Clozapine Lvl: 213 mcg/L
NorClozapine: 151 mcg/L (ref 25–400)

## 2020-06-15 ENCOUNTER — Telehealth (HOSPITAL_COMMUNITY): Payer: Self-pay | Admitting: *Deleted

## 2020-06-15 NOTE — Telephone Encounter (Signed)
Clozaril level faxed to pt pharmacy. Pt did not have CBC? Despite standing orders. Will call to f/u.

## 2020-06-17 ENCOUNTER — Other Ambulatory Visit (HOSPITAL_COMMUNITY): Payer: Self-pay | Admitting: Psychiatry

## 2020-06-17 DIAGNOSIS — F200 Paranoid schizophrenia: Secondary | ICD-10-CM

## 2020-06-21 ENCOUNTER — Ambulatory Visit (HOSPITAL_COMMUNITY): Payer: BC Managed Care – PPO | Admitting: Licensed Clinical Social Worker

## 2020-06-21 ENCOUNTER — Other Ambulatory Visit: Payer: Self-pay

## 2020-06-28 ENCOUNTER — Telehealth (HOSPITAL_COMMUNITY): Payer: Self-pay

## 2020-06-28 ENCOUNTER — Other Ambulatory Visit (HOSPITAL_COMMUNITY): Payer: Self-pay | Admitting: *Deleted

## 2020-06-28 ENCOUNTER — Other Ambulatory Visit (HOSPITAL_COMMUNITY): Payer: Self-pay

## 2020-06-28 DIAGNOSIS — Z79899 Other long term (current) drug therapy: Secondary | ICD-10-CM

## 2020-06-28 NOTE — Telephone Encounter (Signed)
Faxed lab orders to [facility] in [place]. Also called patient to let her know she can go to get her labs done at her convenience. She didn't pick up so I LVM

## 2020-07-05 ENCOUNTER — Other Ambulatory Visit (HOSPITAL_COMMUNITY): Payer: Self-pay

## 2020-07-05 ENCOUNTER — Ambulatory Visit (INDEPENDENT_AMBULATORY_CARE_PROVIDER_SITE_OTHER): Payer: Self-pay | Admitting: Licensed Clinical Social Worker

## 2020-07-05 ENCOUNTER — Other Ambulatory Visit: Payer: Self-pay

## 2020-07-05 DIAGNOSIS — Z79899 Other long term (current) drug therapy: Secondary | ICD-10-CM

## 2020-07-05 DIAGNOSIS — F200 Paranoid schizophrenia: Secondary | ICD-10-CM

## 2020-07-06 ENCOUNTER — Other Ambulatory Visit (HOSPITAL_COMMUNITY): Payer: Self-pay | Admitting: Psychiatry

## 2020-07-06 ENCOUNTER — Encounter (HOSPITAL_COMMUNITY): Payer: Self-pay | Admitting: Licensed Clinical Social Worker

## 2020-07-06 NOTE — Progress Notes (Signed)
Virtual Visit via Video Note  I connected with [place] on 07/06/20 at  8:00 AM EST by a video enabled telemedicine application and verified that I am speaking with the correct person using two identifiers.  Location: Patient: home Provider: home office   I discussed the limitations of evaluation and management by telemedicine and the availability of in person appointments. The patient expressed understanding and agreed to proceed.  Type of Therapy: Individual Therapy   Treatment Goals addressed: "to deal with my depression and severe anxiety". [name] will report improvement in depressed mood and anxiety/panic attacks 3 out of 7 days.    Interventions: CBT   Summary: Danielle [name] is a 50 y.o. female who presents with Schizophrenia and PTSD.   Suicidal/Homicidal: No - without intent/plan   Therapist Response:   Danielle [name] met with clinician for an individual session. Clinician explored her view of her mental status. Danielle [name] discussed her psychiatric symptoms, her current life events. Danielle [name] reports ongoing challenges with getting standing orders for her blood testing. Clinician communicated with nursing staff and worked through the solution. Clinician explored mood and interactions at home. Clinician explored state of current panic attacks. Clinician noted increased panic sxs and processed coping skills. Clinician utilized CBT and explored other options for being able to track and avoid panic triggers. Clinician discussed interactions with family members in the home. Danielle [name] shared frustration that husband still will not go to the doctor for a physical. She shared that she regularly dreams about her husband in a coffin, but he still refuses. Clinician empathized with Danielle [name] and identified the importance of open communication and also choosing to focus on what she can control.     Plan: Return again in 3 weeks.   Diagnosis: Axis I:  Schizophrenia, paranoid type and PTSD            I discussed  the assessment and treatment plan with the patient. The patient was provided an opportunity to ask questions and all were answered. The patient agreed with the plan and demonstrated an understanding of the instructions.   The patient was advised to call back or seek an in-person evaluation if the symptoms worsen or if the condition fails to improve as anticipated.  I provided 45 minutes of non-face-to-face time during this encounter.   [name], LCSW

## 2020-07-07 ENCOUNTER — Telehealth (HOSPITAL_COMMUNITY): Payer: Self-pay

## 2020-07-07 NOTE — Telephone Encounter (Signed)
Received a call from Maggie at [facility] in [place] regarding patient's Clozaril 50mg . She was inquiring about the status of patient's labs that she got done on 3/3 because they're trying to fill her medication. After speaking with the pharmacist, I called [facility] in [place] and was told that the Clozaril came in yesterday (3/3) and it takes 2-4 days for the results to come in. I then called Maggie at [facility] back and informed her. She stated that she would dispense 5 tablets to the patient to get her through until the Clozaril results come in and then they will fill the full script. Pharmacist also stated that due to the 2-4 day wait, she'll make a note for the patient to go for the labs earlier next time she needs to get them done

## 2020-07-10 LAB — CBC WITH DIFFERENTIAL
Basophils Absolute: 0.1 10*3/uL (ref 0.0–0.2)
Basos: 1 %
EOS (ABSOLUTE): 0.2 10*3/uL (ref 0.0–0.4)
Eos: 2 %
Hematocrit: 45.3 % (ref 34.0–46.6)
Hemoglobin: 15.6 g/dL (ref 11.1–15.9)
Immature Grans (Abs): 0.1 10*3/uL (ref 0.0–0.1)
Immature Granulocytes: 1 %
Lymphocytes Absolute: 3 10*3/uL (ref 0.7–3.1)
Lymphs: 29 %
MCH: 32.9 pg (ref 26.6–33.0)
MCHC: 34.4 g/dL (ref 31.5–35.7)
MCV: 96 fL (ref 79–97)
Monocytes Absolute: 0.6 10*3/uL (ref 0.1–0.9)
Monocytes: 5 %
Neutrophils Absolute: 6.5 10*3/uL (ref 1.4–7.0)
Neutrophils: 62 %
RBC: 4.74 x10E6/uL (ref 3.77–5.28)
RDW: 12.5 % (ref 11.7–15.4)
WBC: 10.4 10*3/uL (ref 3.4–10.8)

## 2020-07-10 LAB — CLOZAPINE (CLOZARIL)
Clozapine Lvl: 307 ng/mL — ABNORMAL LOW (ref 350–650)
NorClozapine: 166 ng/mL
Total(Cloz+Norcloz): 473 ng/mL

## 2020-07-10 LAB — FAX REPORT

## 2020-07-17 ENCOUNTER — Other Ambulatory Visit (HOSPITAL_COMMUNITY): Payer: Self-pay | Admitting: Psychiatry

## 2020-07-17 DIAGNOSIS — F4310 Post-traumatic stress disorder, unspecified: Secondary | ICD-10-CM

## 2020-07-19 ENCOUNTER — Ambulatory Visit (INDEPENDENT_AMBULATORY_CARE_PROVIDER_SITE_OTHER): Payer: Self-pay | Admitting: Licensed Clinical Social Worker

## 2020-07-19 ENCOUNTER — Encounter (HOSPITAL_COMMUNITY): Payer: Self-pay | Admitting: Licensed Clinical Social Worker

## 2020-07-19 ENCOUNTER — Other Ambulatory Visit: Payer: Self-pay

## 2020-07-19 DIAGNOSIS — F4310 Post-traumatic stress disorder, unspecified: Secondary | ICD-10-CM

## 2020-07-19 DIAGNOSIS — F200 Paranoid schizophrenia: Secondary | ICD-10-CM

## 2020-07-19 NOTE — Progress Notes (Signed)
Virtual Visit via Video Note  I connected with Danielle [name] on 07/19/20 at  8:00 AM EDT by a video enabled telemedicine application and verified that I am speaking with the correct person using two identifiers.  Location: Patient: home Provider: home office   I discussed the limitations of evaluation and management by telemedicine and the availability of in person appointments. The patient expressed understanding and agreed to proceed.  Type of Therapy: Individual Therapy   Treatment Goals addressed: "to deal with my depression and severe anxiety". Danielle [name] will report improvement in depressed mood and anxiety/panic attacks 3 out of 7 days.    Interventions: CBT   Summary: Danielle [name] is a 50 y.o. female who presents with Schizophrenia and PTSD.   Suicidal/Homicidal: No - without intent/plan   Therapist Response:   Danielle [name] met with clinician for an individual session. Clinician explored her view of her mental status. Danielle [name] discussed her psychiatric symptoms, her current life events. Danielle [name] reports that she has been doing okay, but still having panic attacks. Clinician explored triggers to panic attacks and processed coping skills using CBT. Clinician identified the importance of mindfulness and being able to identify triggers. Clinician processed the usefulness of extended family living in their home. Danielle [name] shared that she feels overwhelmed at times with too many people in the house, so she spends a lot of time in her room. Clinician discussed updates in family with her children. Clinician reflected the mixed emotions of now knowing about her children, that everyone is okay, but also understanding that they do not want her to be involved in their lives.      Plan: Return again in 3 weeks.   Diagnosis: Axis I:  Schizophrenia, paranoid type and PTSD            I discussed the assessment and treatment plan with the patient. The patient was provided an opportunity to ask questions and all  were answered. The patient agreed with the plan and demonstrated an understanding of the instructions.   The patient was advised to call back or seek an in-person evaluation if the symptoms worsen or if the condition fails to improve as anticipated.  I provided 45 minutes of non-face-to-face time during this encounter.   [name], LCSW

## 2020-07-20 ENCOUNTER — Other Ambulatory Visit: Payer: Self-pay

## 2020-07-20 ENCOUNTER — Encounter (HOSPITAL_COMMUNITY): Payer: Self-pay | Admitting: Psychiatry

## 2020-07-20 ENCOUNTER — Telehealth (INDEPENDENT_AMBULATORY_CARE_PROVIDER_SITE_OTHER): Payer: BC Managed Care – PPO | Admitting: Psychiatry

## 2020-07-20 DIAGNOSIS — F419 Anxiety disorder, unspecified: Secondary | ICD-10-CM

## 2020-07-20 DIAGNOSIS — F4310 Post-traumatic stress disorder, unspecified: Secondary | ICD-10-CM

## 2020-07-20 DIAGNOSIS — F200 Paranoid schizophrenia: Secondary | ICD-10-CM

## 2020-07-20 MED ORDER — PRAZOSIN HCL 2 MG PO CAPS: 2.0000 mg | ORAL_CAPSULE | Freq: Every day | ORAL | 0 refills | Status: DC

## 2020-07-20 MED ORDER — CLONAZEPAM 0.5 MG PO TABS
0.5000 mg | ORAL_TABLET | Freq: Three times a day (TID) | ORAL | 1 refills | Status: DC | PRN
Start: 2020-07-20 — End: 2020-08-31

## 2020-07-20 MED ORDER — BENZTROPINE MESYLATE 0.5 MG PO TABS: 0.5000 mg | ORAL_TABLET | Freq: Every day | ORAL | 2 refills | Status: DC

## 2020-07-20 MED ORDER — CLOZAPINE 50 MG PO TABS: ORAL_TABLET | ORAL | 1 refills | Status: DC

## 2020-07-20 MED ORDER — MIRTAZAPINE 30 MG PO TABS: 30.0000 mg | ORAL_TABLET | Freq: Every day | ORAL | 0 refills | Status: DC

## 2020-07-20 NOTE — Progress Notes (Signed)
Virtual Visit via Telephone Note  I connected with Danielle [name] on 07/20/20 at  8:20 AM EDT by telephone and verified that I am speaking with the correct person using two identifiers.  Location: Patient: Home Provider: Home Office   I discussed the limitations, risks, security and privacy concerns of performing an evaluation and management service by telephone and the availability of in person appointments. I also discussed with the patient that there may be a patient responsible charge related to this service. The patient expressed understanding and agreed to proceed.   History of Present Illness: Patient is evaluated by phone session.  She continues to endorse paranoia, anxiety and more intense recently since taking the steroids.  She is also taking antibiotic.  She has more pseudoseizures and now taking the Lamictal 300 mg prescribed by her her neurologist.  She is sleeping on and off.  She endorsed nightmares flashback and sometimes panic attacks.  She is in therapy with [name].  She denies any suicidal thoughts or outburst but more paranoid anxious.  She is taking Klonopin, clozapine, mirtazapine, Cogentin and Minipress.  She has some issues getting blood work as recently changed her lab.  However her last blood work was done 2 weeks ago.  Her clozapine level is slightly decreased.  Her husband is very supportive and provide most of the information.  She like to adjust her medication to help that paranoia, anxiety.  Past Psychiatric History:Reviewed. H/Omultiple hospitalizationdue tomultiple suicidal attempt, psychosis, paranoia, hallucination and severe depression. H/Odrowning herself.Last admission inOctober 2019 at [facility].Tred multiple medications includesZoloft, amitriptyline, Wellbutrin, Latuda, Xanax, Valium, Risperdal, lithium, Haldol, Depakote, Zyprexa, Geodon, imipramine,InVega, temazepamand Paxil, lithium, loxapineand lamictal.Referred  AACTteambutnot approved.   Recent Results (from the past 2160 hour(s))  Clozapine (clozaril)     Status: None   Collection Time: 06/05/20 12:00 AM  Result Value Ref Range   NorClozapine 151 25 - 400 mcg/L   Clozapine Lvl 213 mcg/L    Comment: . The therapeutic response begins to appear at 100 mcg/L. Refractory schizophrenia appears to require a therapeutic concentration of at least 350 mcg/L (trough, at steady state). . Toxic range: Greater than 900 mcg/L . This test was developed and its analytical performance characteristics have been determined by [facility] [place], [place]. It has not been cleared or approved by the U.S. Food and Drug Administration. This assay has been validated pursuant to the CLIA regulations and is used for clinical purposes. .   CBC With Differential     Status: None   Collection Time: 07/06/20  9:16 AM  Result Value Ref Range   WBC 10.4 3.4 - 10.8 x10E3/uL   RBC 4.74 3.77 - 5.28 x10E6/uL   Hemoglobin 15.6 11.1 - 15.9 g/dL   Hematocrit 26.9 48.5 - 46.6 %   MCV 96 79 - 97 fL   MCH 32.9 26.6 - 33.0 pg   MCHC 34.4 31.5 - 35.7 g/dL   RDW 46.2 70.3 - 50.0 %   Neutrophils 62 Not Estab. %   Lymphs 29 Not Estab. %   Monocytes 5 Not Estab. %   Eos 2 Not Estab. %   Basos 1 Not Estab. %   Neutrophils Absolute 6.5 1.4 - 7.0 x10E3/uL   Lymphocytes Absolute 3.0 0.7 - 3.1 x10E3/uL   Monocytes Absolute 0.6 0.1 - 0.9 x10E3/uL   EOS (ABSOLUTE) 0.2 0.0 - 0.4 x10E3/uL   Basophils Absolute 0.1 0.0 - 0.2 x10E3/uL   Immature Granulocytes 1 Not Estab. %  Immature Grans (Abs) 0.1 0.0 - 0.1 x10E3/uL  Clozapine (clozaril)     Status: Abnormal   Collection Time: 07/06/20  9:16 AM  Result Value Ref Range   Clozapine Lvl 307 (L) 350 - 650 ng/mL    Comment: This test was developed and its performance characteristics determined by [facility]. It has not been cleared or approved by the Food and Drug Administration.    NorClozapine 166 Not  Estab. ng/mL    Comment: This test was developed and its performance characteristics determined by [facility]. It has not been cleared or approved by the Food and Drug Administration.    Total(Cloz+Norcloz) 473 ng/mL    Comment: Patients dosed with 400 mg clozapine daily for 4 weeks were most likely to exhibit a therapeutic effect when the sum of clozapine and norclozapine concentrations were at least 450 ng/mL. Charlott Rakes, et al. Juel Burrow Consensus Guidelines for Therapeutic Drug Monitoring in Psychiatry: Update 2011, Pharmacopsychiatry Sep 2011; 44(6):195-235.                                 Detection Limit = 20   Fax Report     Status: None (Preliminary result)   Collection Time: 07/06/20  9:16 AM  Result Value Ref Range   Fax Report WILL FOLLOW     Psychiatric Specialty Exam: Physical Exam  Review of Systems  Weight 253 lb (114.8 kg).There is no height or weight on file to calculate BMI.  General Appearance: NA  Eye Contact:  NA  Speech:  Slow  Volume:  Decreased  Mood:  Anxious  Affect:  NA  Thought Process:  Descriptions of Associations: Circumstantial  Orientation:  Full (Time, Place, and Person)  Thought Content:  Paranoid Ideation and Rumination  Suicidal Thoughts:  No  Homicidal Thoughts:  No  Memory:  Immediate;   Fair Recent;   Fair Remote;   Fair  Judgement:  Fair  Insight:  Shallow  Psychomotor Activity:  NA  Concentration:  Concentration: Fair and Attention Span: Fair  Recall:  [employer] of Knowledge:  Fair  Language:  Fair  Akathisia:  No  Handed:  Right  AIMS (if indicated):     Assets:  Desire for Improvement Housing Social Support  ADL's:  Intact  Cognition:  WNL  Sleep:   fair      Assessment and Plan: Schizophrenia chronic paranoid type.  PTSD.  Anxiety.  Discussed her current medication.  She is taking antibiotic, steroids and recently Lamictal was increased to 300.  Her current situation remains the same as a lot of  people living in the house including her niece and husband's mother.  We discussed combination of polypharmacy and living situation increasing her anxiety.  She insists to have a medication adjustment however I reminded in the past higher dose of clozapine make her dizzy.  She did well on the higher dose of clozapine.  After some discussion she and her husband agreed to watch carefully her level of energy and keep her on fall precaution.  We will increase clozapine another 50 mg in the evening.  She will take 50 mg in the morning, 50 in afternoon, 50 in the evening and 100 at bedtime.  I recommend to stay with one laboratory for clozapine blood work to avoid delayed getting medication.  She agreed with the plan.  I will continue Minipress 2 mg at bedtime, Cogentin 0.5 mg  at bedtime, Klonopin 0.5 mg 3 times a day mirtazapine 30 mg at bedtime.  I recommended if she started to feel dizzy then she need to call us immediately.  Follow-up in 6 weeks.  Follow Up Instructions:    I discussed the assessment and treatment plan with the patient. The patient was provided an opportunity to ask questions and all were answered. The patient agreed with the plan and demonstrated an understanding of the instructions.   The patient was advised to call back or seek an in-person evaluation if the symptoms worsen or if the condition fails to improve as anticipated.  I provided 19 minutes of non-face-to-face time during this encounter.   Cleotis Nipper, MD

## 2020-08-07 ENCOUNTER — Other Ambulatory Visit (HOSPITAL_COMMUNITY): Payer: Self-pay | Admitting: Psychiatry

## 2020-08-09 ENCOUNTER — Other Ambulatory Visit: Payer: Self-pay

## 2020-08-09 ENCOUNTER — Ambulatory Visit (INDEPENDENT_AMBULATORY_CARE_PROVIDER_SITE_OTHER): Payer: BC Managed Care – PPO | Admitting: Licensed Clinical Social Worker

## 2020-08-09 ENCOUNTER — Encounter (HOSPITAL_COMMUNITY): Payer: Self-pay | Admitting: Licensed Clinical Social Worker

## 2020-08-09 DIAGNOSIS — F200 Paranoid schizophrenia: Secondary | ICD-10-CM

## 2020-08-09 DIAGNOSIS — F4310 Post-traumatic stress disorder, unspecified: Secondary | ICD-10-CM

## 2020-08-09 NOTE — Progress Notes (Signed)
Virtual Visit via Video Note  I connected with Danielle [name] on 08/09/20 at  8:00 AM EDT by a video enabled telemedicine application and verified that I am speaking with the correct person using two identifiers.  Location: Patient: home Provider: home office   I discussed the limitations of evaluation and management by telemedicine and the availability of in person appointments. The patient expressed understanding and agreed to proceed.  Type of Therapy: Individual Therapy   Treatment Goals addressed: "to deal with my depression and severe anxiety". Danielle [name] will report improvement in depressed mood and anxiety/panic attacks 3 out of 7 days.    Interventions: CBT   Summary: Danielle [name] is a 50 y.o. female who presents with Schizophrenia and PTSD.   Suicidal/Homicidal: No - without intent/plan   Therapist Response:   Danielle [name] met with clinician for an individual session. Clinician explored her view of her mental status. Danielle [name] discussed her psychiatric symptoms, her current life events. Danielle [name] reports that yesterday was a rough day because it was her son's birthday. Clinician processed thoughts and feelings using CBT. Clinician noted that it is reasonable to continue to feel sad about the alienated relationship she has with her son Danielle [name]. Clinician explored Danielle [name]'s feelings on that day and explored ways to reclaim the day for herself, to celebrate her motherhood, have some cake, and celebrate her son, who may not be talking to her, but who is alive and well. Clinician explored MH sxs and noted daily panic attacks. Clinician explored precursors to panic attacks, relationship between panic and fainting spells. Clinician provided coping skill of drinking cold water, using a cold compress, or eating a peppermint to help with grounding.      Plan: Return again in 3 weeks.   Diagnosis: Axis I:  Schizophrenia, paranoid type and PTSD         I discussed the assessment and treatment plan with the  patient. The patient was provided an opportunity to ask questions and all were answered. The patient agreed with the plan and demonstrated an understanding of the instructions.   The patient was advised to call back or seek an in-person evaluation if the symptoms worsen or if the condition fails to improve as anticipated.  I provided 45 minutes of non-face-to-face time during this encounter.   [name], LCSW

## 2020-08-15 ENCOUNTER — Other Ambulatory Visit (HOSPITAL_COMMUNITY): Payer: Self-pay

## 2020-08-15 DIAGNOSIS — Z79899 Other long term (current) drug therapy: Secondary | ICD-10-CM

## 2020-08-22 LAB — CLOZAPINE (CLOZARIL)
Clozapine Lvl: 189 ng/mL — ABNORMAL LOW (ref 350–650)
NorClozapine: 115 ng/mL
Total(Cloz+Norcloz): 304 ng/mL

## 2020-08-22 LAB — CBC WITH DIFFERENTIAL
Basophils Absolute: 0.1 10*3/uL (ref 0.0–0.2)
Basos: 1 %
EOS (ABSOLUTE): 0.2 10*3/uL (ref 0.0–0.4)
Eos: 2 %
Hematocrit: 48 % — ABNORMAL HIGH (ref 34.0–46.6)
Hemoglobin: 15.8 g/dL (ref 11.1–15.9)
Immature Grans (Abs): 0.1 10*3/uL (ref 0.0–0.1)
Immature Granulocytes: 1 %
Lymphocytes Absolute: 3.1 10*3/uL (ref 0.7–3.1)
Lymphs: 30 %
MCH: 31.5 pg (ref 26.6–33.0)
MCHC: 32.9 g/dL (ref 31.5–35.7)
MCV: 96 fL (ref 79–97)
Monocytes Absolute: 0.6 10*3/uL (ref 0.1–0.9)
Monocytes: 6 %
Neutrophils Absolute: 6.2 10*3/uL (ref 1.4–7.0)
Neutrophils: 60 %
RBC: 5.01 x10E6/uL (ref 3.77–5.28)
RDW: 13 % (ref 11.7–15.4)
WBC: 10.3 10*3/uL (ref 3.4–10.8)

## 2020-08-30 ENCOUNTER — Other Ambulatory Visit: Payer: Self-pay

## 2020-08-30 ENCOUNTER — Encounter (HOSPITAL_COMMUNITY): Payer: Self-pay | Admitting: Licensed Clinical Social Worker

## 2020-08-30 ENCOUNTER — Ambulatory Visit (INDEPENDENT_AMBULATORY_CARE_PROVIDER_SITE_OTHER): Payer: BC Managed Care – PPO | Admitting: Licensed Clinical Social Worker

## 2020-08-30 DIAGNOSIS — F4310 Post-traumatic stress disorder, unspecified: Secondary | ICD-10-CM

## 2020-08-30 NOTE — Progress Notes (Signed)
Virtual Visit via Video Note  I connected with [place] on 08/30/20 at  8:00 AM EDT by a video enabled telemedicine application and verified that I am speaking with the correct person using two identifiers.  Location: Patient: home Provider: home office   I discussed the limitations of evaluation and management by telemedicine and the availability of in person appointments. The patient expressed understanding and agreed to proceed.  Type of Therapy: Individual Therapy   Treatment Goals addressed: "to deal with my depression and severe anxiety". Danielle [name] will report improvement in depressed mood and anxiety/panic attacks 3 out of 7 days.    Interventions: CBT   Summary: Danielle [name] is a 50 y.o. female who presents with Schizophrenia and PTSD.   Suicidal/Homicidal: No - without intent/plan   Therapist Response:   Danielle [name] met with clinician for an individual session. Clinician explored her view of her mental status. Danielle [name] discussed her psychiatric symptoms, her current life events. Danielle [name] reports that she has been doing okay. She shared ongoing issues with anxiety, noting that several days per week, she will have 2-3 panic attacks per week. Clinician explored use of coping skills, deep breathing, quiet time alone, etc using CBT. Clinician also processed thoughts and feelings about upcoming weddings, one in May and one in July, where there will be lots of unfamiliar people. Clinician encouraged use of gradual exposure to practice being out of the house and around others. Clinician discussed ways to safely gradually expose herself to more crowded spaces by going to the mall, the grocery store, or [place]. Clinician also notified Dr. Adele Schilder about concerns re : Klonopin not being effective for panic attacks.    Plan: Return again in 3 weeks.   Diagnosis: Axis I:  Schizophrenia, paranoid type and PTSD      I discussed the assessment and treatment plan with the patient. The patient was provided  an opportunity to ask questions and all were answered. The patient agreed with the plan and demonstrated an understanding of the instructions.   The patient was advised to call back or seek an in-person evaluation if the symptoms worsen or if the condition fails to improve as anticipated.  I provided 45 minutes of non-face-to-face time during this encounter.   [name], LCSW

## 2020-08-31 ENCOUNTER — Other Ambulatory Visit: Payer: Self-pay

## 2020-08-31 ENCOUNTER — Emergency Department
Admission: RE | Admit: 2020-08-31 | Discharge: 2020-08-31 | Disposition: A | Discharge status: Home / Self Care | Payer: BC Managed Care – PPO | Source: Ambulatory Visit | Attending: Family Medicine | Admitting: Family Medicine

## 2020-08-31 ENCOUNTER — Telehealth (INDEPENDENT_AMBULATORY_CARE_PROVIDER_SITE_OTHER): Payer: BC Managed Care – PPO | Admitting: Psychiatry

## 2020-08-31 ENCOUNTER — Encounter (HOSPITAL_COMMUNITY): Payer: Self-pay | Admitting: Psychiatry

## 2020-08-31 VITALS — BP 124/81 | HR 98 | Temp 100.1°F | Resp 18 | Ht 71.5 in | Wt 251.0 lb

## 2020-08-31 DIAGNOSIS — R519 Headache, unspecified: Secondary | ICD-10-CM | POA: Diagnosis not present

## 2020-08-31 DIAGNOSIS — F4310 Post-traumatic stress disorder, unspecified: Secondary | ICD-10-CM | POA: Diagnosis not present

## 2020-08-31 DIAGNOSIS — R0981 Nasal congestion: Secondary | ICD-10-CM | POA: Diagnosis not present

## 2020-08-31 DIAGNOSIS — B372 Candidiasis of skin and nail: Secondary | ICD-10-CM

## 2020-08-31 DIAGNOSIS — J3489 Other specified disorders of nose and nasal sinuses: Secondary | ICD-10-CM

## 2020-08-31 DIAGNOSIS — R1110 Vomiting, unspecified: Secondary | ICD-10-CM

## 2020-08-31 DIAGNOSIS — F200 Paranoid schizophrenia: Secondary | ICD-10-CM | POA: Diagnosis not present

## 2020-08-31 DIAGNOSIS — F419 Anxiety disorder, unspecified: Secondary | ICD-10-CM | POA: Diagnosis not present

## 2020-08-31 DIAGNOSIS — R569 Unspecified convulsions: Secondary | ICD-10-CM

## 2020-08-31 HISTORY — DX: Unspecified asthma, uncomplicated: J45.909

## 2020-08-31 MED ORDER — DIAZEPAM 2 MG PO TABS: ORAL_TABLET | ORAL | 0 refills | Status: DC

## 2020-08-31 MED ORDER — BENZTROPINE MESYLATE 0.5 MG PO TABS: 0.5000 mg | ORAL_TABLET | Freq: Every day | ORAL | 2 refills | Status: DC

## 2020-08-31 MED ORDER — IBUPROFEN 800 MG PO TABS: 800.0000 mg | ORAL_TABLET | Freq: Two times a day (BID) | ORAL | 0 refills | Status: AC

## 2020-08-31 MED ORDER — MIRTAZAPINE 30 MG PO TABS: 30.0000 mg | ORAL_TABLET | Freq: Every day | ORAL | 0 refills | Status: DC

## 2020-08-31 MED ORDER — CLOZAPINE 50 MG PO TABS: ORAL_TABLET | ORAL | 1 refills | Status: DC

## 2020-08-31 MED ORDER — NYSTATIN-TRIAMCINOLONE 100000-0.1 UNIT/GM-% EX CREA: TOPICAL_CREAM | CUTANEOUS | 0 refills | Status: AC

## 2020-08-31 MED ORDER — ONDANSETRON HCL 8 MG PO TABS: 4.0000 mg | ORAL_TABLET | Freq: Three times a day (TID) | ORAL | 0 refills | Status: AC | PRN

## 2020-08-31 MED ORDER — KETOROLAC TROMETHAMINE 60 MG/2ML IM SOLN
60.0000 mg | Freq: Once | INTRAMUSCULAR | Status: AC
  Administered 2020-08-31: 60 mg via INTRAMUSCULAR

## 2020-08-31 MED ORDER — ONDANSETRON 8 MG PO TBDP
8.0000 mg | ORAL_TABLET | Freq: Once | ORAL | Status: AC
  Administered 2020-08-31: 8 mg via ORAL

## 2020-08-31 MED ORDER — FEXOFENADINE HCL 180 MG PO TABS: 180.0000 mg | ORAL_TABLET | Freq: Every day | ORAL | 0 refills | Status: AC

## 2020-08-31 MED ORDER — CLONAZEPAM 0.5 MG PO TABS
0.5000 mg | ORAL_TABLET | Freq: Three times a day (TID) | ORAL | 1 refills | Status: DC | PRN
Start: 2020-08-31 — End: 2020-11-30

## 2020-08-31 MED ORDER — PRAZOSIN HCL 2 MG PO CAPS: 2.0000 mg | ORAL_CAPSULE | Freq: Every day | ORAL | 0 refills | Status: DC

## 2020-08-31 NOTE — Progress Notes (Signed)
Virtual Visit via Telephone Note  I connected with Danielle [name] on 08/31/20 at  8:40 AM EDT by telephone and verified that I am speaking with the correct person using two identifiers.  Location: Patient: Home Provider: Home Office   I discussed the limitations, risks, security and privacy concerns of performing an evaluation and management service by telephone and the availability of in person appointments. I also discussed with the patient that there may be a patient responsible charge related to this service. The patient expressed understanding and agreed to proceed.   History of Present Illness: Patient is evaluated by phone session.  Increase Clozapine on the last visit because she is having paranoia, flashbacks, nightmares and sometimes talk to herself.  She is doing better in her sleep, hallucination and PTSD symptoms but now she is very anxious and nervous.  She has upcoming 2 weddings that she has to travel.  Her niece is getting married in [place] and she is very concerned because they will be more than 100 people in wedding.  She also have to travel and she does not like traveling.  She recalled having panic attacks last time when she had to travel.  She is getting her blood work regularly.  Her blood work is stable.  She has no tremors shakes or any EPS.  She is living with her husband, mother-in-law, her husband nieces.  In the meeting she was very anxious, nervous but now she started to be more comfortable.  She is still stays most of the time in her room but do come out time to time and does not feel as overwhelming.  Her appetite is okay.  Her weight is unchanged from the past.  She is in therapy with [name].  Past Psychiatric History:Reviewed. H/Omultiple hospitalizationdue tomultiple suicidal attempt, psychosis, paranoia, hallucination and severe depression. H/Odrowning herself.Last admission inOctober 2019 at [facility].Tred multiple medications  includesZoloft, amitriptyline, Wellbutrin, Latuda, Xanax, Valium, Risperdal, lithium, Haldol, Depakote, Zyprexa, Geodon, imipramine,InVega, temazepamand Paxil, lithium, loxapineand lamictal.Referred AACTteambutnot approved.   Recent Results (from the past 2160 hour(s))  Clozapine (clozaril)     Status: None   Collection Time: 06/05/20 12:00 AM  Result Value Ref Range   NorClozapine 151 25 - 400 mcg/L   Clozapine Lvl 213 mcg/L    Comment: . The therapeutic response begins to appear at 100 mcg/L. Refractory schizophrenia appears to require a therapeutic concentration of at least 350 mcg/L (trough, at steady state). . Toxic range: Greater than 900 mcg/L . This test was developed and its analytical performance characteristics have been determined by [facility] [place], [place]. It has not been cleared or approved by the U.S. Food and Drug Administration. This assay has been validated pursuant to the CLIA regulations and is used for clinical purposes. .   CBC With Differential     Status: None   Collection Time: 07/06/20  9:16 AM  Result Value Ref Range   WBC 10.4 3.4 - 10.8 x10E3/uL   RBC 4.74 3.77 - 5.28 x10E6/uL   Hemoglobin 15.6 11.1 - 15.9 g/dL   Hematocrit 60.7 37.1 - 46.6 %   MCV 96 79 - 97 fL   MCH 32.9 26.6 - 33.0 pg   MCHC 34.4 31.5 - 35.7 g/dL   RDW 06.2 69.4 - 85.4 %   Neutrophils 62 Not Estab. %   Lymphs 29 Not Estab. %   Monocytes 5 Not Estab. %   Eos 2 Not Estab. %   Basos 1  Not Estab. %   Neutrophils Absolute 6.5 1.4 - 7.0 x10E3/uL   Lymphocytes Absolute 3.0 0.7 - 3.1 x10E3/uL   Monocytes Absolute 0.6 0.1 - 0.9 x10E3/uL   EOS (ABSOLUTE) 0.2 0.0 - 0.4 x10E3/uL   Basophils Absolute 0.1 0.0 - 0.2 x10E3/uL   Immature Granulocytes 1 Not Estab. %   Immature Grans (Abs) 0.1 0.0 - 0.1 x10E3/uL  Clozapine (clozaril)     Status: Abnormal   Collection Time: 07/06/20  9:16 AM  Result Value Ref Range   Clozapine Lvl 307 (L) 350 - 650  ng/mL    Comment: This test was developed and its performance characteristics determined by [facility]. It has not been cleared or approved by the Food and Drug Administration.    NorClozapine 166 Not Estab. ng/mL    Comment: This test was developed and its performance characteristics determined by [facility]. It has not been cleared or approved by the Food and Drug Administration.    Total(Cloz+Norcloz) 473 ng/mL    Comment: Patients dosed with 400 mg clozapine daily for 4 weeks were most likely to exhibit a therapeutic effect when the sum of clozapine and norclozapine concentrations were at least 450 ng/mL. Charlott Rakes, et al. Juel Burrow Consensus Guidelines for Therapeutic Drug Monitoring in Psychiatry: Update 2011, Pharmacopsychiatry Sep 2011; 44(6):195-235.                                 Detection Limit = 20   Fax Report     Status: None (Preliminary result)   Collection Time: 07/06/20  9:16 AM  Result Value Ref Range   Fax Report WILL FOLLOW   CBC With Differential     Status: Abnormal   Collection Time: 08/21/20  8:57 AM  Result Value Ref Range   WBC 10.3 3.4 - 10.8 x10E3/uL   RBC 5.01 3.77 - 5.28 x10E6/uL   Hemoglobin 15.8 11.1 - 15.9 g/dL   Hematocrit 02.5 (H) 42.7 - 46.6 %   MCV 96 79 - 97 fL   MCH 31.5 26.6 - 33.0 pg   MCHC 32.9 31.5 - 35.7 g/dL   RDW 06.2 37.6 - 28.3 %   Neutrophils 60 Not Estab. %   Lymphs 30 Not Estab. %   Monocytes 6 Not Estab. %   Eos 2 Not Estab. %   Basos 1 Not Estab. %   Neutrophils Absolute 6.2 1.4 - 7.0 x10E3/uL   Lymphocytes Absolute 3.1 0.7 - 3.1 x10E3/uL   Monocytes Absolute 0.6 0.1 - 0.9 x10E3/uL   EOS (ABSOLUTE) 0.2 0.0 - 0.4 x10E3/uL   Basophils Absolute 0.1 0.0 - 0.2 x10E3/uL   Immature Granulocytes 1 Not Estab. %   Immature Grans (Abs) 0.1 0.0 - 0.1 x10E3/uL  Clozapine (clozaril)     Status: Abnormal   Collection Time: 08/21/20  8:57 AM  Result Value Ref Range   Clozapine Lvl 189 (L) 350 - 650 ng/mL    Comment:  This test was developed and its performance characteristics determined by [facility]. It has not been cleared or approved by the Food and Drug Administration.    NorClozapine 115 Not Estab. ng/mL    Comment: This test was developed and its performance characteristics determined by [facility]. It has not been cleared or approved by the Food and Drug Administration.    Total(Cloz+Norcloz) 304 ng/mL    Comment: Patients dosed with 400 mg clozapine daily for 4 weeks were most  likely to exhibit a therapeutic effect when the sum of clozapine and norclozapine concentrations were at least 450 ng/mL. Charlott Rakes, et al. Juel Burrow Consensus Guidelines for Therapeutic Drug Monitoring in Psychiatry: Update 2011, Pharmacopsychiatry Sep 2011; 44(6):195-235.                                 Detection Limit = 20    Psychiatric Specialty Exam: Physical Exam  Review of Systems  Weight 251 lb (113.9 kg).There is no height or weight on file to calculate BMI.  General Appearance: NA  Eye Contact:  NA  Speech:  Slow  Volume:  Decreased  Mood:  Anxious  Affect:  NA  Thought Process:  Descriptions of Associations: Intact  Orientation:  Full (Time, Place, and Person)  Thought Content:  Paranoid Ideation  Suicidal Thoughts:  No  Homicidal Thoughts:  No  Memory:  Immediate;   Fair Recent;   Fair Remote;   Fair  Judgement:  Fair  Insight:  Shallow  Psychomotor Activity:  NA  Concentration:  Concentration: Fair and Attention Span: Fair  Recall:  [employer] of Knowledge:  Fair  Language:  Fair  Akathisia:  No  Handed:  Right  AIMS (if indicated):     Assets:  Communication Skills Desire for Improvement Housing Social Support  ADL's:  Intact  Cognition:  WNL  Sleep:   fair      Assessment and Plan: Schizophrenia chronic paranoid type.  PTSD.  Anxiety.  I reviewed blood work results and current medication.  Patient would like to try Valium as she feels sometimes Klonopin not  working as good.  I reviewed previous notes.  She had tried Valium but it dropped her blood pressure and make her very dizzy.  She already on multiple medication and I recommend she should stay on Klonopin however we will provide 5 tablets of Valium 2 mg that she can take only for traveling and severe panic attack.  There will be no refills.  I encouraged not to take unless it is necessary because together with all the medication she can drop the blood pressure.  I also encouraged have her husband monitor closely after taking Valium to check for dizziness or fall.  Patient agreed with the plan.  I also encouraged should continue therapy and may need extra session before going to a wedding in [place].  Continue Clozapine 50 mg in the morning, 50 mg in the evening, 50 mg in the afternoon and 100 mg at bedtime, Cogentin 0.5 mg at bedtime, Minipress 2 mg at bedtime, mirtazapine 30 mg at bedtime, Klonopin 0.5 mg 3 times a day and 1 time Valium 2 mg # 5 tablets to take only for severe panic attack.  Recommended to call us back if is any question or any concern.  Follow-up in 3 months.  Follow Up Instructions:    I discussed the assessment and treatment plan with the patient. The patient was provided an opportunity to ask questions and all were answered. The patient agreed with the plan and demonstrated an understanding of the instructions.   The patient was advised to call back or seek an in-person evaluation if the symptoms worsen or if the condition fails to improve as anticipated.  I provided 28 minutes of non-face-to-face time during this encounter.   Cleotis Nipper, MD

## 2020-08-31 NOTE — Discharge Instructions (Addendum)
Please adhere to a brat diet and for the next 2 to 3 days.  Increase daily hydration with Gatorade or Gatorade G2, ginger ale, or water to avoid dehydration.  Please do not take previously prescribed Mobic 15 mg daily when taking newly prescribed ibuprofen 800 mg 1-2 times daily for the next 7 to 10 days.

## 2020-08-31 NOTE — ED Notes (Signed)
At approx 1145, pt was walking around unit, seemingly confused per [place], Charity fundraiser. Then pt reported she felt like she was going to pass out. Fara Boros, RN assisted pt to chair and she began shaking w/ eyes closed. Trevor Iha, FNP called to the room. Spo2 95%, HR 90 bpm. Husband comes to pt's room as she is "waking up." He reports that she has these pseudoseizures occasionally and is forgetful once she awakens. Pt states she does not remember any of the staff here at [facility]. She is minimally verbal, but states she will drink some juice. Pt d/c'd home by M. Ragan, FNP after he reviews d/c instructions w/ pt and her husband.

## 2020-08-31 NOTE — ED Provider Notes (Signed)
[facility]    CSN: [id number] Arrival date & time: 08/31/20  1037      History   Chief Complaint Chief Complaint  Patient presents with  . Vomiting  . Headache    HPI Danielle [name] is a 50 y.o. female.   HPI 50 year old female presents with nausea and vomiting for 2 days.  Denies abdominal pain.  Patient denies consuming contaminated foods or beverages in the past 2 days.  Denies recent travel or exposure to environmental contaminants.  Patient also complains of headache, rhinorrhea, postnasal drip, and rash of the left breast.  Danielle [name] reports 5-6 bouts of emesis over the past 24 hours.  Patient reports headache is 8.5 of 10, occurs infrequently on the top of her head and posterior head bilaterally.  Danielle [name] denies changes in visual acuity, lightheadedness, or dizziness.  Reports runny nose and postnasal drip is prevented her from wearing CPAP nightly for the past 3-4 nights.  Additionally, patient reports rash of left breast for 2-3 months.  Past Medical History:  Diagnosis Date  . Anemia   . Anxiety   . Asthma   . Bipolar 1 disorder (HCC)   . Depressed   . Headache(784.0)   . Heart murmur   . Hypothyroidism   . PTSD (post-traumatic stress disorder)   . Schizophrenia (HCC)   . Seizures (HCC)   . Shortness of breath     Patient Active Problem List   Diagnosis Date Noted  . Borderline personality disorder (HCC) 03/06/2018  . 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 Suicidal ideation 02/28/2018  . Severe major depression, single episode, with psychotic features (HCC) 02/27/2018  . Frequent falls 06/18/2015  . Tobacco use disorder 06/08/2015  . Hyperammonemia (HCC) 06/08/2015  . Paranoid schizophrenia (HCC)   . Migraine headache 06/04/2012  . Seizure (HCC) 06/04/2012  . Hypothyroidism 06/04/2012  . OSA (obstructive sleep apnea) 08/29/2011  . Post traumatic stress disorder (PTSD) 07/31/2011    Past Surgical History:  Procedure Laterality Date  . FOOT SURGERY  Right    Rod removed  . LEG SURGERY    . NASAL FRACTURE SURGERY    . TUBAL LIGATION  1996    OB History   No obstetric history on file.      Home Medications    Prior to Admission medications   Medication Sig Start Date End Date Taking? Authorizing Provider  fexofenadine (ALLEGRA) 180 MG tablet Take 1 tablet (180 mg total) by mouth daily for 10 days. 08/31/20 09/10/20 Yes Trevor Iha, FNP  ibuprofen (ADVIL) 800 MG tablet Take 1 tablet (800 mg total) by mouth 2 (two) times daily for 10 days. 08/31/20 09/10/20 Yes Trevor Iha, FNP  nystatin-triamcinolone (MYCOLOG II) cream Apply to affected area of left mammary folds twice daily x 14 days 08/31/20  Yes Trevor Iha, FNP  ondansetron (ZOFRAN) 8 MG tablet Take 0.5 tablets (4 mg total) by mouth every 8 (eight) hours as needed for up to 7 days for nausea or vomiting. 08/31/20 09/07/20 Yes Trevor Iha, FNP  albuterol (VENTOLIN HFA) 108 (90 Base) MCG/ACT inhaler INHALE TWO PUFFS EVERY FOUR TO SIX HOURS AS NEEDED. 09/30/19   [provider]  aspirin 81 MG chewable tablet Chew 162 mg by mouth daily.    [provider]  baclofen (LIORESAL) 20 MG tablet Take by mouth. 10/08/18   [provider]  benztropine (COGENTIN) 0.5 MG tablet Take 1 tablet (0.5 mg total) by mouth at bedtime. 08/31/20 08/31/21  Arfeen, Phillips Grout,  MD  Butalbital-APAP-Caffeine 16-109-6050-325-40 MG capsule Take by mouth. 10/28/19   [provider]  clonazePAM (KLONOPIN) 0.5 MG tablet Take 1 tablet (0.5 mg total) by mouth 3 (three) times daily as needed for anxiety. 08/31/20   Arfeen, Phillips GroutSyed T, MD  clozapine (CLOZARIL) 50 MG tablet Take one in morning, one in afternoon, one in evening and two tab at bed time 08/31/20   Arfeen, Phillips GroutSyed T, MD  [employer] VITAMIN C 500 MG tablet Take 500 mg by mouth daily. 01/01/20   [provider]  diazepam (VALIUM) 2 MG tablet Take one tab as needed for panic attack or before travelling. 08/31/20   Arfeen, Phillips GroutSyed T, MD  diltiazem  (DILACOR XR) 120 MG 24 hr capsule Take 120 mg by mouth daily. 11/26/16   [provider]  eletriptan (RELPAX) 40 MG tablet TAKE 1 TAB AT ONSET OF HEADACHES. MAY REPEAT IN 2 HOURS.MAX 2/24 HRS. FOR 30 DAYS Patient not taking: No sig reported 07/22/16   [provider]  ergocalciferol (VITAMIN D2) 1.25 MG (50000 UT) capsule Take 1 capsule by mouth 2 (two) times a week. 01/05/20   [provider]  ferrous sulfate 325 (65 FE) MG tablet TAKE ONE TABLET (325 MG DOSE) BY MOUTH 2 (TWO) TIMES DAILY. 08/18/19   [provider]  Fluticasone-Umeclidin-Vilant (TRELEGY ELLIPTA) 200-62.5-25 MCG/INH AEPB Inhale into the lungs. 10/06/19   [provider]  folic acid (FOLVITE) 1 MG tablet TAKE ONE TABLET (1 MG DOSE) BY MOUTH DAILY FOR 30 DAYS. 09/09/19   [provider]  hydrochlorothiazide (HYDRODIURIL) 12.5 MG tablet TAKE ONE TABLET (12.5 MG DOSE) BY MOUTH DAILY. TAKE ON TABLET BY MOUTH ONCE A DAY IN THE MORNING. 01/06/18   [provider]  lamoTRIgine (LAMICTAL) 150 MG tablet Take 150 mg by mouth 2 (two) times daily. 06/29/20   [provider]  levothyroxine (SYNTHROID) 100 MCG tablet Take by mouth. 01/07/20 02/06/20  [provider]  meloxicam (MOBIC) 15 MG tablet Take 1 tablet (15 mg total) by mouth daily. 01/19/20   Bing NeighborsHarris, [name] S, FNP  mirtazapine (REMERON) 30 MG tablet Take 1 tablet (30 mg total) by mouth at bedtime. 08/31/20   Cleotis NipperArfeen, Syed T, MD  omeprazole (PRILOSEC) 40 MG capsule  07/19/18   [provider]  potassium chloride (KLOR-CON) 10 MEQ tablet Take 2 tablets by mouth daily. 09/27/19   [provider]  pravastatin (PRAVACHOL) 40 MG tablet Take by mouth. 12/13/19   [provider]  prazosin (MINIPRESS) 2 MG capsule Take 1 capsule (2 mg total) by mouth at bedtime. 08/31/20   Arfeen, Phillips GroutSyed T, MD  tiZANidine (ZANAFLEX) 2 MG tablet Take 1 tablet (2 mg total) by mouth at bedtime. 01/19/20   Bing NeighborsHarris, [name] S, FNP   TROKENDI XR 100 MG CP24 Take 1 tablet by mouth daily. 08/16/17   [provider]  TROKENDI XR 200 MG CP24 Take 1 tablet by mouth daily. 08/16/17   [provider]  zolpidem (AMBIEN) 5 MG tablet Take 1 tablet (5 mg total) by mouth at bedtime as needed for sleep. 03/22/11 07/29/11  Mechele Dawleyrsini, Janice M, NP    Family History Family History  Adopted: Yes  Problem Relation Age of Onset  . Drug abuse Daughter        heroin addiction    Social History Social History   Tobacco Use  . Smoking status: Current Every Day Smoker    Packs/day: 1.00    Years: 30.00    Pack years:  30.00    Types: Cigarettes    Last attempt to quit: 09/20/2011    Years since quitting: 8.9  . Smokeless tobacco: Never Used  Vaping Use  . Vaping Use: Never used  Substance Use Topics  . Alcohol use: No    Alcohol/week: 0.0 standard drinks  . Drug use: No     Allergies   Tomato, Imitrex [sumatriptan base], Other, and Mustard seed   Review of Systems Review of Systems  Please see HPI Physical Exam Triage Vital Signs ED Triage Vitals [08/31/20 1053]  Enc Vitals Group     BP      Pulse      Resp      Temp      Temp src      SpO2      Weight 251 lb (113.9 kg)     Height 5' 11.5" (1.816 m)     Head Circumference      Peak Flow      Pain Score 7     Pain Loc      Pain Edu?      Excl. in GC?    No data found.  Updated Vital Signs BP 124/81 (BP Location: Right Arm)   Pulse 98   Temp 100.1 F (37.8 C) (Oral)   Resp 18   Ht 5' 11.5" (1.816 m)   Wt 251 lb (113.9 kg)   SpO2 92%   BMI 34.52 kg/m   Visual Acuity Physical Exam Constitutional:      Appearance: Danielle [name] is obese.  HENT:     Head: Normocephalic and atraumatic.     Mouth/Throat:     Mouth: Mucous membranes are moist.     Pharynx: Oropharynx is clear.  Eyes:     Extraocular Movements: Extraocular movements intact.     Pupils: Pupils are equal, round, and reactive to light.  Cardiovascular:     Rate and Rhythm:  Normal rate and regular rhythm.     Heart sounds: Normal heart sounds.  Pulmonary:     Effort: Pulmonary effort is normal.     Breath sounds: Normal breath sounds.  Abdominal:     General: Bowel sounds are normal. There is no distension.     Palpations: Abdomen is soft. There is no mass.     Tenderness: There is no abdominal tenderness. There is no guarding.  Musculoskeletal:     Cervical back: Normal range of motion and neck supple.  Skin:    General: Skin is warm.     Comments: Left inframammary crease: erythematous, macerated, plaques, erosions, including erythematous satellites with delicate peripheral scaling and papulopustules.  Neurological:     Mental Status: Danielle [name] is alert and oriented to person, place, and time.  Psychiatric:        Mood and Affect: Mood normal.        Speech: Speech normal.        Behavior: Behavior normal.      UC Treatments / Results  Labs (all labs ordered are listed, but only abnormal results are displayed) Labs Reviewed - No data to display  EKG   Radiology No results found.  Procedures Procedures (including critical care time)  Medications Ordered in UC Medications  ketorolac (TORADOL) injection 60 mg (60 mg Intramuscular Given 08/31/20 1130)  ondansetron (ZOFRAN-ODT) disintegrating tablet 8 mg (8 mg Oral Given 08/31/20 1130)    Initial Impression / Assessment and Plan / UC Course  I have reviewed the triage vital signs and  the nursing notes.  Pertinent labs & imaging results that were available during my care of the patient were reviewed by me and considered in my medical decision making (see chart for details).   1. Headache, 2. Nausea and vomiting,  3. Rhinorrhea and postnasal drip, 4. Intertrigo, 5. Pseudoseizures At the conclusion of this office visit today patient had 2 brief pseudoseizures with her husband [name]) present reports his wife experiences pseudoseizures all the time and on daily basis.  Patient was hemodynamically  stable prior to discharge today.  Final Clinical Impressions(s) / UC Diagnoses   Final diagnoses:  Vomiting in adult patient  Headache in back of head  Candidal intertrigo  Nasal congestion with rhinorrhea  Pseudoseizures (HCC)     Discharge Instructions     Please adhere to a brat diet and for the next 2 to 3 days.  Increase daily hydration with Gatorade or Gatorade G2, ginger ale, or water to avoid dehydration.  Please do not take previously prescribed Mobic 15 mg daily when taking newly prescribed ibuprofen 800 mg 1-2 times daily for the next 7 to 10 days.    ED Prescriptions    Medication Sig Dispense Auth. Provider   ondansetron (ZOFRAN) 8 MG tablet Take 0.5 tablets (4 mg total) by mouth every 8 (eight) hours as needed for up to 7 days for nausea or vomiting. 21 tablet Trevor Iha, FNP   ibuprofen (ADVIL) 800 MG tablet Take 1 tablet (800 mg total) by mouth 2 (two) times daily for 10 days. 20 tablet Trevor Iha, FNP   fexofenadine (ALLEGRA) 180 MG tablet Take 1 tablet (180 mg total) by mouth daily for 10 days. 10 tablet Trevor Iha, FNP   nystatin-triamcinolone (MYCOLOG II) cream Apply to affected area of left mammary folds twice daily x 14 days 60 g Trevor Iha, FNP     PDMP not reviewed this encounter.   Trevor Iha, FNP 08/31/20 1232

## 2020-08-31 NOTE — ED Triage Notes (Addendum)
Pt presents to Urgent Care with c/o vomiting x 2 days. Reports she cannot keep anything down. Denies abdominal pain. Reports she has now developed a HA d/t frequent vomiting.

## 2020-09-20 ENCOUNTER — Other Ambulatory Visit: Payer: Self-pay

## 2020-09-20 ENCOUNTER — Other Ambulatory Visit (HOSPITAL_COMMUNITY): Payer: Self-pay

## 2020-09-20 ENCOUNTER — Ambulatory Visit (INDEPENDENT_AMBULATORY_CARE_PROVIDER_SITE_OTHER): Payer: BC Managed Care – PPO | Admitting: Licensed Clinical Social Worker

## 2020-09-20 DIAGNOSIS — Z79899 Other long term (current) drug therapy: Secondary | ICD-10-CM

## 2020-09-20 DIAGNOSIS — F419 Anxiety disorder, unspecified: Secondary | ICD-10-CM | POA: Diagnosis not present

## 2020-09-20 DIAGNOSIS — F200 Paranoid schizophrenia: Secondary | ICD-10-CM

## 2020-09-21 ENCOUNTER — Encounter (HOSPITAL_COMMUNITY): Payer: Self-pay | Admitting: Licensed Clinical Social Worker

## 2020-09-21 NOTE — Progress Notes (Signed)
Virtual Visit via Video Note  I connected with [place] on 09/21/20 at  8:00 AM EDT by a video enabled telemedicine application and verified that I am speaking with the correct person using two identifiers.  Location: Patient: home Provider: home office   I discussed the limitations of evaluation and management by telemedicine and the availability of in person appointments. The patient expressed understanding and agreed to proceed.  Type of Therapy: Individual Therapy   Treatment Goals addressed: "to deal with my depression and severe anxiety". Danielle [name] will report improvement in depressed mood and anxiety/panic attacks 3 out of 7 days.    Interventions: CBT   Summary: Danielle [name] is a 50 y.o. female who presents with Schizophrenia and PTSD.   Suicidal/Homicidal: No - without intent/plan   Therapist Response:   Danielle [name] met with clinician for an individual session. Clinician explored her view of her mental status. Danielle [name] discussed her psychiatric symptoms, her current life events. Danielle [name] reports that she had a good time at her family wedding in [place] last week. She reported that she took medications from Dr. Adele Schilder when she was going to be at large events, which were helpful to keep her calm. Clinician utilized CBT to explore coping skills she used when she started to feel uncomfortable. Clinician identified that finding a quiet place to sit down, taking deep breaths, and drinking cold water were helpful.    Plan: Return again in 3 weeks.   Diagnosis: Axis I:  Schizophrenia, paranoid type and PTSD       I discussed the assessment and treatment plan with the patient. The patient was provided an opportunity to ask questions and all were answered. The patient agreed with the plan and demonstrated an understanding of the instructions.   The patient was advised to call back or seek an in-person evaluation if the symptoms worsen or if the condition fails to improve as anticipated.  I provided  45 minutes of non-face-to-face time during this encounter.   [name], LCSW

## 2020-09-23 ENCOUNTER — Other Ambulatory Visit (HOSPITAL_COMMUNITY): Payer: Self-pay | Admitting: Psychiatry

## 2020-09-23 DIAGNOSIS — F200 Paranoid schizophrenia: Secondary | ICD-10-CM

## 2020-09-23 DIAGNOSIS — F4310 Post-traumatic stress disorder, unspecified: Secondary | ICD-10-CM

## 2020-10-11 ENCOUNTER — Ambulatory Visit (INDEPENDENT_AMBULATORY_CARE_PROVIDER_SITE_OTHER): Payer: BC Managed Care – PPO | Admitting: Licensed Clinical Social Worker

## 2020-10-11 ENCOUNTER — Other Ambulatory Visit: Payer: Self-pay

## 2020-10-11 DIAGNOSIS — F200 Paranoid schizophrenia: Secondary | ICD-10-CM

## 2020-10-12 ENCOUNTER — Encounter (HOSPITAL_COMMUNITY): Payer: Self-pay | Admitting: Licensed Clinical Social Worker

## 2020-10-12 NOTE — Progress Notes (Signed)
Virtual Visit via Video Note  I connected with Sharon on 10/12/20 at  8:00 AM EDT by a video enabled telemedicine application and verified that I am speaking with the correct person using two identifiers.  Location: Patient: home Provider: home office   I discussed the limitations of evaluation and management by telemedicine and the availability of in person appointments. The patient expressed understanding and agreed to proceed.  Type of Therapy: Individual Therapy   Treatment Goals addressed: "to deal with my depression and severe anxiety". Ludy will report improvement in depressed mood and anxiety/panic attacks 3 out of 7 days.   Interventions: CBT   Summary: EMILINE MANCEBO is a 50 y.o. female who presents with Schizophrenia and PTSD.   Suicidal/Homicidal: No - without intent/plan   Therapist Response:   Finnleigh met with clinician for an individual session. Clinician explored her view of her mental status. Nikeya discussed her psychiatric symptoms, her current life events. Brittne reports that she continues to have panic attacks on a regular basis. She has not been able to pinpoint specific triggers, but noted that being in enclosed or crowded spaces can be triggering. Clinician utilized CBT to process coping skills. Clinician offered option of writing in a notebook that she is safe and what is happening to her, especially for when she loses her memory and voice. Tonita shared what she has written in a notebook, identifying who she is, where she is, who is in the house, and what to do.   Plan: Return again in 3 weeks.   Diagnosis: Axis I:  Schizophrenia, paranoid type and PTSD       I discussed the assessment and treatment plan with the patient. The patient was provided an opportunity to ask questions and all were answered. The patient agreed with the plan and demonstrated an understanding of the instructions.   The patient was advised to call back or seek an in-person evaluation  if the symptoms worsen or if the condition fails to improve as anticipated.  I provided 45 minutes of non-face-to-face time during this encounter.   [name], LCSW

## 2020-10-27 ENCOUNTER — Other Ambulatory Visit (HOSPITAL_COMMUNITY): Payer: Self-pay

## 2020-10-27 DIAGNOSIS — Z79899 Other long term (current) drug therapy: Secondary | ICD-10-CM

## 2020-11-03 ENCOUNTER — Other Ambulatory Visit (HOSPITAL_COMMUNITY): Payer: Self-pay | Admitting: Psychiatry

## 2020-11-08 ENCOUNTER — Ambulatory Visit (HOSPITAL_COMMUNITY): Payer: BC Managed Care – PPO | Admitting: Licensed Clinical Social Worker

## 2020-11-08 LAB — CBC WITH DIFFERENTIAL
Basophils Absolute: 0.1 10*3/uL (ref 0.0–0.2)
Basos: 1 %
EOS (ABSOLUTE): 0.1 10*3/uL (ref 0.0–0.4)
Eos: 2 %
Hematocrit: 47.9 % — ABNORMAL HIGH (ref 34.0–46.6)
Hemoglobin: 15.8 g/dL (ref 11.1–15.9)
Immature Grans (Abs): 0 10*3/uL (ref 0.0–0.1)
Immature Granulocytes: 1 %
Lymphocytes Absolute: 1.6 10*3/uL (ref 0.7–3.1)
Lymphs: 20 %
MCH: 31 pg (ref 26.6–33.0)
MCHC: 33 g/dL (ref 31.5–35.7)
MCV: 94 fL (ref 79–97)
Monocytes Absolute: 0.5 10*3/uL (ref 0.1–0.9)
Monocytes: 7 %
Neutrophils Absolute: 5.6 10*3/uL (ref 1.4–7.0)
Neutrophils: 69 %
RBC: 5.09 x10E6/uL (ref 3.77–5.28)
RDW: 12.2 % (ref 11.7–15.4)
WBC: 7.9 10*3/uL (ref 3.4–10.8)

## 2020-11-08 LAB — CLOZAPINE (CLOZARIL)
Clozapine Lvl: 468 ng/mL (ref 350–650)
NorClozapine: 189 ng/mL
Total(Cloz+Norcloz): 657 ng/mL

## 2020-11-15 ENCOUNTER — Ambulatory Visit (INDEPENDENT_AMBULATORY_CARE_PROVIDER_SITE_OTHER): Payer: BC Managed Care – PPO | Admitting: Licensed Clinical Social Worker

## 2020-11-15 ENCOUNTER — Encounter (HOSPITAL_COMMUNITY): Payer: Self-pay | Admitting: Licensed Clinical Social Worker

## 2020-11-15 ENCOUNTER — Other Ambulatory Visit: Payer: Self-pay

## 2020-11-15 DIAGNOSIS — F4310 Post-traumatic stress disorder, unspecified: Secondary | ICD-10-CM

## 2020-11-15 DIAGNOSIS — F200 Paranoid schizophrenia: Secondary | ICD-10-CM | POA: Diagnosis not present

## 2020-11-15 NOTE — Progress Notes (Signed)
Virtual Visit via Video Note  I connected with Danielle [name] on 11/15/20 at  8:00 AM EDT by a video enabled telemedicine application and verified that I am speaking with the correct person using two identifiers.  Location: Patient: home Provider: home office   I discussed the limitations of evaluation and management by telemedicine and the availability of in person appointments. The patient expressed understanding and agreed to proceed.  Type of Therapy: Individual Therapy   Treatment Goals addressed: "to deal with my depression and severe anxiety". Danielle [name] will report improvement in depressed mood and anxiety/panic attacks 3 out of 7 days.   Interventions: CBT   Summary: Danielle [name] is a 50 y.o. female who presents with Schizophrenia and PTSD.   Suicidal/Homicidal: No - without intent/plan   Therapist Response:   Prudence met with clinician for an individual session. Clinician explored her view of her mental status. Danielle [name] discussed her psychiatric symptoms, her current life events. Danielle [name] reports that there has been a lot of things going on in her house and she is feeling overwhelmed. Clinician processed updates and mixed feelings about having daughter move back in. Clinician utilized CBT to identify pros and cons of daughter being home. Clinician reflected concerns about their past history of getting along for a few months, but then having problems. Clinician offered options for increasing positive attitude about daughter being there and having some hope that this time would be a little different. Clinician discussed Danielle [name]'s birthday yesterday and processed her thoughts and feelings about the date day with her husband. Clinician also explored thoughts and feelings about the upcoming year.    Plan: Return again in 3 weeks.   Diagnosis: Axis I:  Schizophrenia, paranoid type and PTSD         I discussed the assessment and treatment plan with the patient. The patient was provided an  opportunity to ask questions and all were answered. The patient agreed with the plan and demonstrated an understanding of the instructions.   The patient was advised to call back or seek an in-person evaluation if the symptoms worsen or if the condition fails to improve as anticipated.  I provided 45 minutes of non-face-to-face time during this encounter.    R , LCSW  

## 2020-11-17 ENCOUNTER — Other Ambulatory Visit (HOSPITAL_COMMUNITY): Payer: Self-pay | Admitting: *Deleted

## 2020-11-17 DIAGNOSIS — F4310 Post-traumatic stress disorder, unspecified: Secondary | ICD-10-CM

## 2020-11-17 DIAGNOSIS — F200 Paranoid schizophrenia: Secondary | ICD-10-CM

## 2020-11-17 MED ORDER — CLOZAPINE 50 MG PO TABS: ORAL_TABLET | ORAL | 1 refills | Status: DC

## 2020-11-21 ENCOUNTER — Telehealth (HOSPITAL_COMMUNITY): Payer: Self-pay

## 2020-11-21 MED ORDER — DIAZEPAM 2 MG PO TABS: ORAL_TABLET | ORAL | 0 refills | Status: DC

## 2020-11-21 NOTE — Telephone Encounter (Signed)
I called the prescription Valium 2 mg to take before traveling however husband need to monitor her blood pressure closely as Valium causes decreased blood pressure and dizziness.  Patient is aware about Valium side effects but needs reminder.

## 2020-11-21 NOTE — Telephone Encounter (Addendum)
Patient's husband called requesting a refill on patient's Diazepam 2mg  to be sent to [address]. They're going out of town this weekend for a wedding and needs to have it with her. Thank you  NOTIFIED PATIENT

## 2020-11-27 ENCOUNTER — Other Ambulatory Visit (HOSPITAL_COMMUNITY): Payer: Self-pay | Admitting: Psychiatry

## 2020-11-27 DIAGNOSIS — F419 Anxiety disorder, unspecified: Secondary | ICD-10-CM

## 2020-11-27 DIAGNOSIS — F4310 Post-traumatic stress disorder, unspecified: Secondary | ICD-10-CM

## 2020-11-30 ENCOUNTER — Encounter (HOSPITAL_COMMUNITY): Payer: Self-pay | Admitting: Psychiatry

## 2020-11-30 ENCOUNTER — Telehealth (INDEPENDENT_AMBULATORY_CARE_PROVIDER_SITE_OTHER): Payer: BC Managed Care – PPO | Admitting: Psychiatry

## 2020-11-30 ENCOUNTER — Other Ambulatory Visit: Payer: Self-pay

## 2020-11-30 DIAGNOSIS — F4310 Post-traumatic stress disorder, unspecified: Secondary | ICD-10-CM

## 2020-11-30 DIAGNOSIS — F200 Paranoid schizophrenia: Secondary | ICD-10-CM | POA: Diagnosis not present

## 2020-11-30 DIAGNOSIS — F419 Anxiety disorder, unspecified: Secondary | ICD-10-CM

## 2020-11-30 MED ORDER — MIRTAZAPINE 30 MG PO TABS: 30.0000 mg | ORAL_TABLET | Freq: Every day | ORAL | 0 refills | Status: AC

## 2020-11-30 MED ORDER — CLONAZEPAM 0.5 MG PO TABS: 0.5000 mg | ORAL_TABLET | Freq: Three times a day (TID) | ORAL | 1 refills | Status: DC | PRN

## 2020-11-30 MED ORDER — BENZTROPINE MESYLATE 0.5 MG PO TABS: 0.5000 mg | ORAL_TABLET | Freq: Every day | ORAL | 2 refills | Status: AC

## 2020-11-30 MED ORDER — CLOZAPINE 50 MG PO TABS
ORAL_TABLET | ORAL | 1 refills | Status: DC
Start: 2020-11-30 — End: 2021-01-11

## 2020-11-30 MED ORDER — LAMOTRIGINE 150 MG PO TABS: 150.0000 mg | ORAL_TABLET | Freq: Two times a day (BID) | ORAL | 2 refills | Status: AC

## 2020-11-30 MED ORDER — PRAZOSIN HCL 2 MG PO CAPS: 2.0000 mg | ORAL_CAPSULE | Freq: Every day | ORAL | 0 refills | Status: AC
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
## 2020-12-04 ENCOUNTER — Other Ambulatory Visit (HOSPITAL_COMMUNITY): Payer: Self-pay

## 2020-12-04 DIAGNOSIS — Z79899 Other long term (current) drug therapy: Secondary | ICD-10-CM

## 2020-12-06 ENCOUNTER — Other Ambulatory Visit (HOSPITAL_COMMUNITY): Payer: Self-pay | Admitting: Psychiatry

## 2020-12-06 ENCOUNTER — Other Ambulatory Visit: Payer: Self-pay

## 2020-12-06 ENCOUNTER — Ambulatory Visit (HOSPITAL_COMMUNITY): Payer: BC Managed Care – PPO | Admitting: Licensed Clinical Social Worker

## 2020-12-07 LAB — CBC WITH DIFFERENTIAL
Basophils Absolute: 0.1 10*3/uL (ref 0.0–0.2)
Basos: 1 %
EOS (ABSOLUTE): 0.2 10*3/uL (ref 0.0–0.4)
Eos: 2 %
Hematocrit: 47.7 % — ABNORMAL HIGH (ref 34.0–46.6)
Hemoglobin: 16.1 g/dL — ABNORMAL HIGH (ref 11.1–15.9)
Immature Grans (Abs): 0.1 10*3/uL (ref 0.0–0.1)
Immature Granulocytes: 1 %
Lymphocytes Absolute: 2.6 10*3/uL (ref 0.7–3.1)
Lymphs: 26 %
MCH: 31.4 pg (ref 26.6–33.0)
MCHC: 33.8 g/dL (ref 31.5–35.7)
MCV: 93 fL (ref 79–97)
Monocytes Absolute: 0.6 10*3/uL (ref 0.1–0.9)
Monocytes: 6 %
Neutrophils Absolute: 6.5 10*3/uL (ref 1.4–7.0)
Neutrophils: 64 %
RBC: 5.12 x10E6/uL (ref 3.77–5.28)
RDW: 12.1 % (ref 11.7–15.4)
WBC: 9.9 10*3/uL (ref 3.4–10.8)

## 2020-12-07 LAB — CLOZAPINE (CLOZARIL)
Clozapine Lvl: 275 ng/mL — ABNORMAL LOW (ref 350–650)
NorClozapine: 116 ng/mL
Total(Cloz+Norcloz): 391 ng/mL

## 2020-12-27 ENCOUNTER — Ambulatory Visit (HOSPITAL_COMMUNITY): Payer: BC Managed Care – PPO | Admitting: Licensed Clinical Social Worker

## 2020-12-27 ENCOUNTER — Other Ambulatory Visit: Payer: Self-pay

## 2020-12-28 ENCOUNTER — Telehealth (HOSPITAL_COMMUNITY): Payer: BC Managed Care – PPO | Admitting: Psychiatry

## 2020-12-28 ENCOUNTER — Other Ambulatory Visit: Payer: Self-pay

## 2021-01-01 ENCOUNTER — Other Ambulatory Visit (HOSPITAL_COMMUNITY): Payer: Self-pay | Admitting: *Deleted

## 2021-01-01 DIAGNOSIS — F200 Paranoid schizophrenia: Secondary | ICD-10-CM

## 2021-01-01 DIAGNOSIS — Z79899 Other long term (current) drug therapy: Secondary | ICD-10-CM

## 2021-01-09 ENCOUNTER — Other Ambulatory Visit (HOSPITAL_COMMUNITY): Payer: Self-pay | Admitting: Psychiatry

## 2021-01-09 ENCOUNTER — Telehealth (HOSPITAL_COMMUNITY): Payer: BC Managed Care – PPO | Admitting: Psychiatry

## 2021-01-10 ENCOUNTER — Other Ambulatory Visit: Payer: Self-pay | Admitting: Family Medicine

## 2021-01-10 LAB — CBC/DIFF AMBIGUOUS DEFAULT
Basophils Absolute: 0.1 10*3/uL (ref 0.0–0.2)
Basos: 1 %
EOS (ABSOLUTE): 0.2 10*3/uL (ref 0.0–0.4)
Eos: 2 %
Hematocrit: 50.3 % — ABNORMAL HIGH (ref 34.0–46.6)
Hemoglobin: 16.9 g/dL — ABNORMAL HIGH (ref 11.1–15.9)
Immature Grans (Abs): 0.1 10*3/uL (ref 0.0–0.1)
Immature Granulocytes: 1 %
Lymphocytes Absolute: 2.1 10*3/uL (ref 0.7–3.1)
Lymphs: 27 %
MCH: 31.4 pg (ref 26.6–33.0)
MCHC: 33.6 g/dL (ref 31.5–35.7)
MCV: 93 fL (ref 79–97)
Monocytes Absolute: 0.4 10*3/uL (ref 0.1–0.9)
Monocytes: 5 %
Neutrophils Absolute: 5.3 10*3/uL (ref 1.4–7.0)
Neutrophils: 64 %
Platelets: 275 10*3/uL (ref 150–450)
RBC: 5.39 x10E6/uL — ABNORMAL HIGH (ref 3.77–5.28)
RDW: 13 % (ref 11.7–15.4)
WBC: 8.1 10*3/uL (ref 3.4–10.8)

## 2021-01-10 LAB — CLOZAPINE (CLOZARIL)
Clozapine Lvl: 421 ng/mL (ref 350–650)
NorClozapine: 237 ng/mL
Total(Cloz+Norcloz): 658 ng/mL

## 2021-01-11 ENCOUNTER — Telehealth (INDEPENDENT_AMBULATORY_CARE_PROVIDER_SITE_OTHER): Payer: BC Managed Care – PPO | Admitting: Psychiatry

## 2021-01-11 ENCOUNTER — Other Ambulatory Visit: Payer: Self-pay

## 2021-01-11 ENCOUNTER — Encounter (HOSPITAL_COMMUNITY): Payer: Self-pay | Admitting: Psychiatry

## 2021-01-11 VITALS — Wt 251.0 lb

## 2021-01-11 DIAGNOSIS — F419 Anxiety disorder, unspecified: Secondary | ICD-10-CM

## 2021-01-11 DIAGNOSIS — F4310 Post-traumatic stress disorder, unspecified: Secondary | ICD-10-CM | POA: Diagnosis not present

## 2021-01-11 DIAGNOSIS — F200 Paranoid schizophrenia: Secondary | ICD-10-CM | POA: Diagnosis not present

## 2021-01-11 MED ORDER — CLOZAPINE 50 MG PO TABS
ORAL_TABLET | ORAL | 0 refills | Status: AC
Start: 2021-01-11 — End: ?

## 2021-01-11 MED ORDER — DIAZEPAM 2 MG PO TABS: ORAL_TABLET | ORAL | 0 refills | Status: DC
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
## 2021-01-22 ENCOUNTER — Other Ambulatory Visit (HOSPITAL_COMMUNITY): Payer: Self-pay | Admitting: *Deleted

## 2021-01-22 ENCOUNTER — Telehealth (HOSPITAL_COMMUNITY): Payer: Self-pay | Admitting: *Deleted

## 2021-01-22 DIAGNOSIS — F419 Anxiety disorder, unspecified: Secondary | ICD-10-CM

## 2021-01-22 DIAGNOSIS — F4310 Post-traumatic stress disorder, unspecified: Secondary | ICD-10-CM

## 2021-01-22 MED ORDER — CLONAZEPAM 0.5 MG PO TABS: 0.5000 mg | ORAL_TABLET | Freq: Three times a day (TID) | ORAL | 1 refills | Status: DC | PRN

## 2021-01-22 MED ORDER — CLONAZEPAM 0.5 MG PO TABS: 0.5000 mg | ORAL_TABLET | Freq: Three times a day (TID) | ORAL | 0 refills | Status: AC | PRN

## 2021-01-22 NOTE — Telephone Encounter (Signed)
Pt's husband, [name], called stating that pt is "hearing voices" which he states are "the same as always" but denies command type. [name] and pt feel this is due to the discontinuation of the Klonopin and starting Valium. Husband would like to d/c Valium and restart Klonopin. Please advise. Thanks.

## 2021-01-22 NOTE — Telephone Encounter (Signed)
Please discontinue Valium and restart Klonopin.

## 2021-01-24 ENCOUNTER — Ambulatory Visit (HOSPITAL_COMMUNITY): Payer: BC Managed Care – PPO | Admitting: Licensed Clinical Social Worker

## 2021-01-24 ENCOUNTER — Other Ambulatory Visit: Payer: Self-pay

## 2021-02-08 ENCOUNTER — Telehealth (HOSPITAL_COMMUNITY): Payer: BC Managed Care – PPO | Admitting: Psychiatry

## 2021-02-14 ENCOUNTER — Ambulatory Visit (HOSPITAL_COMMUNITY): Payer: BC Managed Care – PPO | Admitting: Licensed Clinical Social Worker

## 2021-03-06 DEATH — deceased
